# Patient Record
Sex: Male | Born: 1980
Health system: Southern US, Community
[De-identification: ages and names within clinical notes are randomized; demographics above are authoritative.]

## PROBLEM LIST (undated history)

## (undated) DIAGNOSIS — I639 Cerebral infarction, unspecified: Secondary | ICD-10-CM

## (undated) DIAGNOSIS — R569 Unspecified convulsions: Secondary | ICD-10-CM

## (undated) DIAGNOSIS — F1910 Other psychoactive substance abuse, uncomplicated: Secondary | ICD-10-CM

## (undated) DIAGNOSIS — Z87898 Personal history of other specified conditions: Secondary | ICD-10-CM

## (undated) DIAGNOSIS — I10 Essential (primary) hypertension: Secondary | ICD-10-CM

## (undated) DIAGNOSIS — E119 Type 2 diabetes mellitus without complications: Secondary | ICD-10-CM

## (undated) DIAGNOSIS — E876 Hypokalemia: Secondary | ICD-10-CM

## (undated) DIAGNOSIS — R739 Hyperglycemia, unspecified: Secondary | ICD-10-CM

## (undated) DIAGNOSIS — N189 Chronic kidney disease, unspecified: Secondary | ICD-10-CM

## (undated) HISTORY — DX: Other psychoactive substance abuse, uncomplicated: F19.10

## (undated) HISTORY — DX: Personal history of other specified conditions: Z87.898

## (undated) HISTORY — DX: Unspecified convulsions: R56.9

## (undated) HISTORY — DX: Chronic kidney disease, unspecified: N18.9

## (undated) HISTORY — DX: Type 2 diabetes mellitus without complications: E11.9

## (undated) HISTORY — DX: Hyperglycemia, unspecified: R73.9

## (undated) HISTORY — DX: Hypokalemia: E87.6

## (undated) HISTORY — DX: Cerebral infarction, unspecified: I63.9

---

## 2016-04-03 ENCOUNTER — Encounter (HOSPITAL_COMMUNITY): Payer: Self-pay | Admitting: Emergency Medicine

## 2016-04-03 ENCOUNTER — Emergency Department (HOSPITAL_COMMUNITY)
Admission: EM | Admit: 2016-04-03 | Discharge: 2016-04-04 | Disposition: A | Discharge status: Home / Self Care | Payer: Self-pay | Attending: Emergency Medicine | Admitting: Emergency Medicine

## 2016-04-03 DIAGNOSIS — R197 Diarrhea, unspecified: Secondary | ICD-10-CM | POA: Insufficient documentation

## 2016-04-03 DIAGNOSIS — I10 Essential (primary) hypertension: Secondary | ICD-10-CM | POA: Insufficient documentation

## 2016-04-03 DIAGNOSIS — R6889 Other general symptoms and signs: Secondary | ICD-10-CM

## 2016-04-03 DIAGNOSIS — Z79899 Other long term (current) drug therapy: Secondary | ICD-10-CM | POA: Insufficient documentation

## 2016-04-03 DIAGNOSIS — R1110 Vomiting, unspecified: Secondary | ICD-10-CM | POA: Insufficient documentation

## 2016-04-03 HISTORY — DX: Essential (primary) hypertension: I10

## 2016-04-03 LAB — URINALYSIS, ROUTINE W REFLEX MICROSCOPIC
BACTERIA UA: NONE SEEN
BILIRUBIN URINE: NEGATIVE
HGB URINE DIPSTICK: NEGATIVE
Ketones, ur: 5 mg/dL — AB
NITRITE: NEGATIVE
PROTEIN: 100 mg/dL — AB
Specific Gravity, Urine: 1.024 (ref 1.005–1.030)
pH: 7 (ref 5.0–8.0)

## 2016-04-03 LAB — CBC
HEMATOCRIT: 41.5 % (ref 39.0–52.0)
HEMOGLOBIN: 14.6 g/dL (ref 13.0–17.0)
MCH: 25.6 pg — ABNORMAL LOW (ref 26.0–34.0)
MCHC: 35.2 g/dL (ref 30.0–36.0)
MCV: 72.8 fL — ABNORMAL LOW (ref 78.0–100.0)
Platelets: 182 10*3/uL (ref 150–400)
RBC: 5.7 MIL/uL (ref 4.22–5.81)
RDW: 14 % (ref 11.5–15.5)
WBC: 8.5 10*3/uL (ref 4.0–10.5)

## 2016-04-03 LAB — COMPREHENSIVE METABOLIC PANEL
ALBUMIN: 4.3 g/dL (ref 3.5–5.0)
ALT: 28 U/L (ref 17–63)
ANION GAP: 9 (ref 5–15)
AST: 26 U/L (ref 15–41)
Alkaline Phosphatase: 93 U/L (ref 38–126)
BILIRUBIN TOTAL: 0.9 mg/dL (ref 0.3–1.2)
BUN: 12 mg/dL (ref 6–20)
CALCIUM: 8.7 mg/dL — AB (ref 8.9–10.3)
CO2: 28 mmol/L (ref 22–32)
Chloride: 99 mmol/L — ABNORMAL LOW (ref 101–111)
Creatinine, Ser: 1.23 mg/dL (ref 0.61–1.24)
GLUCOSE: 355 mg/dL — AB (ref 65–99)
POTASSIUM: 3.4 mmol/L — AB (ref 3.5–5.1)
Sodium: 136 mmol/L (ref 135–145)
TOTAL PROTEIN: 7.6 g/dL (ref 6.5–8.1)

## 2016-04-03 LAB — LIPASE, BLOOD: Lipase: 24 U/L (ref 11–51)

## 2016-04-03 MED ORDER — HYDROCHLOROTHIAZIDE 25 MG PO TABS
25.0000 mg | ORAL_TABLET | Freq: Every day | ORAL | Status: DC
  Administered 2016-04-03: 25 mg via ORAL
  Filled 2016-04-03: qty 1

## 2016-04-03 NOTE — ED Notes (Signed)
Informed patient that a urine sample was needed, patient stated he just went, will recheck in about 30 minutes.

## 2016-04-03 NOTE — ED Triage Notes (Signed)
Pt states that he has been staying with his brother and his family that got dx with flu.  For past 3-4 days, pt has been weak, has headache, NVD.  Pt is very hypertensive at 243/148 (done twice to confirm).  Pt w/ hx of htn but has been out of meds for one week due to insurance.

## 2016-04-04 MED ORDER — HYDROCHLOROTHIAZIDE 25 MG PO TABS: 25.0000 mg | ORAL_TABLET | Freq: Every day | ORAL | 2 refills | Status: DC

## 2016-04-04 NOTE — ED Provider Notes (Signed)
[facility]-EMERGENCY DEPT Provider Note   CSN: [id number] Arrival date & time: 04/03/16  2147  By signing my name below, I, [name], attest that this documentation has been prepared under the direction and in the presence of Derwood KaplanAnkit Charonda Hefter, MD. Electronically Signed: [name], ER Scribe. 11/17/2015. 12:23 AM.   History   Chief Complaint Chief Complaint  Patient presents with  . Headache  . Generalized Body Aches  . Nausea  . Emesis  . Diarrhea   The history is provided by the patient. No language interpreter was used.    HPI Comments: Micheal Levy is a 35 y.o. male who presents to the Emergency Department complaining of complaing of four days of HA, diarrhea and general malaise. He is also complaining of one event of vomiting today after eating. He has a PMHx of HTN and has been out of his blood pressure medication for one week because he lost his job and insurance. He has been prescribed HCTZ 25mg . He denies dizziness, weakenss, numbness, visual changes, cough or abdonimal pain. Pt's nephew was recently diagnosed with flu.   Past Medical History:  Diagnosis Date  . Hypertension     There are no active problems to display for this patient.   No past surgical history on file.   Home Medications    Prior to Admission medications   Medication Sig Start Date End Date Taking? Authorizing Provider  hydrochlorothiazide (HYDRODIURIL) 25 MG tablet Take 1 tablet (25 mg total) by mouth daily. 04/04/16   Derwood KaplanAnkit Lavaun Greenfield, MD    Family History No family history on file.  Social History Social History  Substance Use Topics  . Smoking status: Never Smoker  . Smokeless tobacco: Not on file  . Alcohol use No     Allergies   Patient has no known allergies.   Review of Systems Review of Systems A complete 10 system review of systems was obtained and all systems are negative except as noted in the HPI and PMH.   Physical Exam Updated Vital Signs BP (!) 180/115    Pulse 87   Temp 98.6 F (37 C) (Oral)   Resp (!) 27   SpO2 98%   Physical Exam  Constitutional: He is oriented to person, place, and time. He appears well-developed and well-nourished. No distress.  HENT:  Head: Normocephalic and atraumatic.  Mucous membranes moist, sclera clear.   Eyes: Pupils are equal, round, and reactive to light.  Neck: Neck supple.  Cardiovascular: Normal rate.   Pulmonary/Chest: Effort normal. No respiratory distress.  Musculoskeletal: Normal range of motion.  Neurological: He is alert and oriented to person, place, and time. Coordination normal.  Upper and lower sensory exam normal. Cranial nerve 2-12 intact.   Skin: Skin is warm and dry. He is not diaphoretic.  Psychiatric: He has a normal mood and affect. His behavior is normal.  Nursing note and vitals reviewed.    ED Treatments / Results  DIAGNOSTIC STUDIES: Oxygen Saturation is 98% on RA, normal by my interpretation.    COORDINATION OF CARE: 12:25 AM Discussed treatment plan with pt at bedside and pt agreed to plan.  Labs (all labs ordered are listed, but only abnormal results are displayed) Labs Reviewed  COMPREHENSIVE METABOLIC PANEL - Abnormal; Notable for the following:       Result Value   Potassium 3.4 (*)    Chloride 99 (*)    Glucose, Bld 355 (*)    Calcium 8.7 (*)    All  other components within normal limits  CBC - Abnormal; Notable for the following:    MCV 72.8 (*)    MCH 25.6 (*)    All other components within normal limits  URINALYSIS, ROUTINE W REFLEX MICROSCOPIC - Abnormal; Notable for the following:    Glucose, UA >=500 (*)    Ketones, ur 5 (*)    Protein, ur 100 (*)    Leukocytes, UA TRACE (*)    Squamous Epithelial / LPF 0-5 (*)    All other components within normal limits  LIPASE, BLOOD    EKG  EKG Interpretation None       Radiology No results found.  Procedures Procedures (including critical care time)  Medications Ordered in ED Medications    hydrochlorothiazide (HYDRODIURIL) tablet 25 mg (25 mg Oral Given 04/03/16 2357)     Initial Impression / Assessment and Plan / ED Course  I have reviewed the triage vital signs and the nursing notes.  Pertinent labs & imaging results that were available during my care of the patient were reviewed by me and considered in my medical decision making (see chart for details).  Clinical Course    I personally performed the services described in this documentation, which was scribed in my presence. The recorded information has been reviewed and is accurate.  Pt comes in with cc of elevated BP and flu like symptoms.  Flu like symptoms - pt has some GI symptoms - loose BM, emesis x 1. Pt doesn't have much of URI like symptoms. Likely viral syndrome - but pt is immunocompetent and has normal vitals. Strict ER return precautions have been discussed, and patient is agreeing with the plan and is comfortable.  Pt also has elevated BP. Headache w/o neuro complains and the headaches are intermittent. Non focal neuro exam. We will start pt back on HCTZ. Labs reassuring, no chest pain.   Final Clinical Impressions(s) / ED Diagnoses   Final diagnoses:  Flu-like symptoms  Essential hypertension    New Prescriptions Current Discharge Medication List             Derwood KaplanAnkit Arkie Tagliaferro, MD 04/04/16 [phone]

## 2016-12-14 ENCOUNTER — Encounter (HOSPITAL_COMMUNITY): Payer: Self-pay | Admitting: Emergency Medicine

## 2016-12-14 DIAGNOSIS — Z79899 Other long term (current) drug therapy: Secondary | ICD-10-CM | POA: Insufficient documentation

## 2016-12-14 DIAGNOSIS — R05 Cough: Secondary | ICD-10-CM | POA: Insufficient documentation

## 2016-12-14 DIAGNOSIS — I10 Essential (primary) hypertension: Secondary | ICD-10-CM | POA: Insufficient documentation

## 2016-12-14 NOTE — ED Triage Notes (Signed)
Reports URI symptoms for three days.  Taking nyquil and dayquil.  Also c/o pain in left ribcage on when touching or coughing.  BP elevated in triage.  Took medication this morning.

## 2016-12-14 NOTE — ED Notes (Signed)
Pt to NF requesting update, informed pt how many ahead. Apologized for delay and attempted to explain acuity to pt

## 2016-12-15 ENCOUNTER — Emergency Department (HOSPITAL_COMMUNITY): Payer: Self-pay

## 2016-12-15 ENCOUNTER — Emergency Department (HOSPITAL_COMMUNITY)
Admission: EM | Admit: 2016-12-15 | Discharge: 2016-12-15 | Disposition: A | Discharge status: Home / Self Care | Payer: Self-pay | Attending: Emergency Medicine | Admitting: Emergency Medicine

## 2016-12-15 DIAGNOSIS — E876 Hypokalemia: Secondary | ICD-10-CM

## 2016-12-15 DIAGNOSIS — R05 Cough: Secondary | ICD-10-CM

## 2016-12-15 DIAGNOSIS — R059 Cough, unspecified: Secondary | ICD-10-CM

## 2016-12-15 LAB — BASIC METABOLIC PANEL
Anion gap: 11 (ref 5–15)
BUN: 13 mg/dL (ref 6–20)
CHLORIDE: 102 mmol/L (ref 101–111)
CO2: 26 mmol/L (ref 22–32)
Calcium: 8.8 mg/dL — ABNORMAL LOW (ref 8.9–10.3)
Creatinine, Ser: 1.32 mg/dL — ABNORMAL HIGH (ref 0.61–1.24)
GFR calc Af Amer: >60 mL/min (ref >60)
Glucose, Bld: 133 mg/dL — ABNORMAL HIGH (ref 65–99)
POTASSIUM: 2.8 mmol/L — AB (ref 3.5–5.1)
SODIUM: 139 mmol/L (ref 135–145)

## 2016-12-15 LAB — CBC WITH DIFFERENTIAL/PLATELET
BASOS ABS: 0 10*3/uL (ref 0.0–0.1)
Basophils Relative: 0 %
EOS ABS: 0.2 10*3/uL (ref 0.0–0.7)
Eosinophils Relative: 2 %
HCT: 37.9 % — ABNORMAL LOW (ref 39.0–52.0)
HEMOGLOBIN: 12.4 g/dL — AB (ref 13.0–17.0)
LYMPHS ABS: 2.5 10*3/uL (ref 0.7–4.0)
LYMPHS PCT: 28 %
MCH: 25.4 pg — AB (ref 26.0–34.0)
MCHC: 32.7 g/dL (ref 30.0–36.0)
MCV: 77.5 fL — ABNORMAL LOW (ref 78.0–100.0)
Monocytes Absolute: 0.7 10*3/uL (ref 0.1–1.0)
Monocytes Relative: 7 %
NEUTROS PCT: 63 %
Neutro Abs: 5.6 10*3/uL (ref 1.7–7.7)
Platelets: 226 10*3/uL (ref 150–400)
RBC: 4.89 MIL/uL (ref 4.22–5.81)
RDW: 14.7 % (ref 11.5–15.5)
WBC: 9.1 10*3/uL (ref 4.0–10.5)

## 2016-12-15 LAB — I-STAT TROPONIN, ED: TROPONIN I, POC: 0.02 ng/mL (ref 0.00–0.08)

## 2016-12-15 LAB — BRAIN NATRIURETIC PEPTIDE: B NATRIURETIC PEPTIDE 5: 284.8 pg/mL — AB (ref 0.0–100.0)

## 2016-12-15 MED ORDER — ALBUTEROL SULFATE HFA 108 (90 BASE) MCG/ACT IN AERS
2.0000 | INHALATION_SPRAY | RESPIRATORY_TRACT | Status: DC | PRN
  Administered 2016-12-15: 2 via RESPIRATORY_TRACT
  Filled 2016-12-15: qty 6.7

## 2016-12-15 MED ORDER — POTASSIUM CHLORIDE CRYS ER 20 MEQ PO TBCR: 20.0000 meq | EXTENDED_RELEASE_TABLET | Freq: Once | ORAL | 0 refills | Status: DC

## 2016-12-15 MED ORDER — LABETALOL HCL 5 MG/ML IV SOLN
20.0000 mg | Freq: Once | INTRAVENOUS | Status: AC
  Administered 2016-12-15: 20 mg via INTRAVENOUS
  Filled 2016-12-15: qty 4

## 2016-12-15 MED ORDER — ALBUTEROL SULFATE (2.5 MG/3ML) 0.083% IN NEBU: 2.5000 mg | INHALATION_SOLUTION | Freq: Four times a day (QID) | RESPIRATORY_TRACT | 12 refills | Status: DC | PRN

## 2016-12-15 MED ORDER — POTASSIUM CHLORIDE CRYS ER 20 MEQ PO TBCR
40.0000 meq | EXTENDED_RELEASE_TABLET | Freq: Once | ORAL | Status: AC
  Administered 2016-12-15: 40 meq via ORAL
  Filled 2016-12-15: qty 2

## 2016-12-15 MED ORDER — IPRATROPIUM-ALBUTEROL 0.5-2.5 (3) MG/3ML IN SOLN
3.0000 mL | Freq: Once | RESPIRATORY_TRACT | Status: AC
  Administered 2016-12-15: 3 mL via RESPIRATORY_TRACT
  Filled 2016-12-15: qty 3

## 2016-12-15 MED ORDER — AMOXICILLIN-POT CLAVULANATE 875-125 MG PO TABS: 1.0000 | ORAL_TABLET | Freq: Two times a day (BID) | ORAL | 0 refills | Status: DC

## 2016-12-15 MED ORDER — FUROSEMIDE 20 MG PO TABS: 20.0000 mg | ORAL_TABLET | Freq: Every day | ORAL | 0 refills | Status: DC

## 2016-12-15 NOTE — ED Notes (Signed)
Pt ambulatory to room with steady gait

## 2016-12-15 NOTE — ED Notes (Signed)
Pt transported to XR.  

## 2016-12-15 NOTE — ED Notes (Signed)
Pt reports he has not followed up with PCP about BP since Nov 2017. Pt states "Yeah it's always that high. I take my medicine every morning but it's always 200 somethin"

## 2016-12-15 NOTE — ED Notes (Signed)
ED Provider at bedside. 

## 2016-12-15 NOTE — ED Provider Notes (Signed)
[facility]-EMERGENCY DEPT Provider Note   CSN: [id number] Arrival date & time: 12/14/16  2024     History   Chief Complaint Chief Complaint  Patient presents with  . Cough  . chest congestion    HPI Micheal Levy is a 36 y.o. male.  Patient presents to the ED with a chief complaint of cough.  He states that he has had a productive lingering cough for the past 2 months.  However, he states that his symptoms worsened over the past 3-4 days.  He now complains of left lower chest pain, which is worse when coughing and taking a deep breath.  He denies any fevers or chills.  Denies any nausea or vomiting.  There are no other associated symptoms or modifying factors.   The history is provided by the patient. No language interpreter was used.    Past Medical History:  Diagnosis Date  . Hypertension     There are no active problems to display for this patient.   History reviewed. No pertinent surgical history.     Home Medications    Prior to Admission medications   Medication Sig Start Date End Date Taking? Authorizing Provider  hydrochlorothiazide (HYDRODIURIL) 25 MG tablet Take 1 tablet (25 mg total) by mouth daily. 04/04/16   Derwood Kaplan, MD    Family History No family history on file.  Social History Social History  Substance Use Topics  . Smoking status: Never Smoker  . Smokeless tobacco: Former Neurosurgeon  . Alcohol use No     Allergies   Patient has no known allergies.   Review of Systems Review of Systems  All other systems reviewed and are negative.    Physical Exam Updated Vital Signs BP (!) 216/151 (BP Location: Right Arm)   Pulse (!) 102   Temp 98.8 F (37.1 C) (Oral)   Resp 18   Ht  (1.753 m)   Wt 103 kg (227 lb)   SpO2 99%   BMI 33.52 kg/m   Physical Exam  Constitutional: He is oriented to person, place, and time. He appears well-developed and well-nourished.  HENT:  Head: Normocephalic and atraumatic.  Eyes: Pupils are equal,  round, and reactive to light. Conjunctivae and EOM are normal. Right eye exhibits no discharge. Left eye exhibits no discharge. No scleral icterus.  Neck: Normal range of motion. Neck supple. No JVD present.  Cardiovascular: Normal rate, regular rhythm and normal heart sounds.  Exam reveals no gallop and no friction rub.   No murmur heard. Pulmonary/Chest: Effort normal. No respiratory distress. He has wheezes. He has no rales. He exhibits no tenderness.  Bibasilar crackles and wheezes  Abdominal: Soft. He exhibits no distension and no mass. There is no tenderness. There is no rebound and no guarding.  Musculoskeletal: Normal range of motion. He exhibits no edema or tenderness.  Neurological: He is alert and oriented to person, place, and time.  Skin: Skin is warm and dry.  Psychiatric: He has a normal mood and affect. His behavior is normal. Judgment and thought content normal.  Nursing note and vitals reviewed.    ED Treatments / Results  Labs (all labs ordered are listed, but only abnormal results are displayed) Labs Reviewed - No data to display  EKG  EKG Interpretation None       Radiology No results found.  Procedures Procedures (including critical care time)  Medications Ordered in ED Medications  ipratropium-albuterol (DUONEB) 0.5-2.5 (3) MG/3ML nebulizer solution 3 mL (not administered)  Initial Impression / Assessment and Plan / ED Course  I have reviewed the triage vital signs and the nursing notes.  Pertinent labs & imaging results that were available during my care of the patient were reviewed by me and considered in my medical decision making (see chart for details).     Patient with linger productive cough, worsening over the past several days.  Wheezing and crackles on exam.  Will give breathing treatment and get CXR.  Suspect pneumonia.  Chest x-ray shows cardiomegaly, and pulmonary edema without focal consolidation. Given that the patient's blood  pressure is quite elevated, will investigate this further. Will give the patient some labetalol to bring down his pressure and will check labs. I updated the patient, he agrees with this plan.  Patient states that he does feel improved after receiving albuterol treatment.  Patient and labs discussed with Dr. Blinda LeatherwoodPollina, who recommends repleting K and discharging with a small amount of lasix.  Recommends close follow-up with cardiology.  I will also treat for pneumonia given his symptoms today.  Final Clinical Impressions(s) / ED Diagnoses   Final diagnoses:  Cough  Hypokalemia    New Prescriptions New Prescriptions   ALBUTEROL (PROVENTIL) (2.5 MG/3ML) 0.083% NEBULIZER SOLUTION    Take 3 mLs (2.5 mg total) by nebulization every 6 (six) hours as needed for wheezing or shortness of breath.   AMOXICILLIN-CLAVULANATE (AUGMENTIN) 875-125 MG TABLET    Take 1 tablet by mouth every 12 (twelve) hours.   FUROSEMIDE (LASIX) 20 MG TABLET    Take 1 tablet (20 mg total) by mouth daily.   POTASSIUM CHLORIDE SA (K-DUR,KLOR-CON) 20 MEQ TABLET    Take 1 tablet (20 mEq total) by mouth once.     Roxy HorsemanBrowning, Calvary Difranco, PA-C 12/15/16 0455    Gilda CreasePollina, Christopher J, MD 12/15/16 [phone]

## 2016-12-15 NOTE — Discharge Instructions (Signed)
You need to take the antibiotics for the cough.  Your x-ray showed some concerning findings for possible pulmonary edema.  Please follow-up with the cardiologist ASAP for this.  You have been given a fluid pill, please take as directed.

## 2016-12-15 NOTE — ED Notes (Signed)
BNP will result in approx 15 min per lab

## 2017-03-07 DIAGNOSIS — I639 Cerebral infarction, unspecified: Secondary | ICD-10-CM

## 2017-03-07 DIAGNOSIS — R569 Unspecified convulsions: Secondary | ICD-10-CM

## 2017-03-07 HISTORY — DX: Unspecified convulsions: R56.9

## 2017-03-07 HISTORY — DX: Cerebral infarction, unspecified: I63.9

## 2017-03-21 ENCOUNTER — Emergency Department (HOSPITAL_COMMUNITY): Payer: Self-pay

## 2017-03-21 ENCOUNTER — Inpatient Hospital Stay (HOSPITAL_COMMUNITY)
Admission: EM | Admit: 2017-03-21 | Discharge: 2017-03-26 | DRG: 100 | Disposition: A | Discharge status: Home / Self Care | Payer: Self-pay | Attending: Internal Medicine | Admitting: Internal Medicine

## 2017-03-21 DIAGNOSIS — W19XXXA Unspecified fall, initial encounter: Secondary | ICD-10-CM | POA: Diagnosis present

## 2017-03-21 DIAGNOSIS — R569 Unspecified convulsions: Principal | ICD-10-CM | POA: Diagnosis present

## 2017-03-21 DIAGNOSIS — N179 Acute kidney failure, unspecified: Secondary | ICD-10-CM | POA: Diagnosis present

## 2017-03-21 DIAGNOSIS — I6340 Cerebral infarction due to embolism of unspecified cerebral artery: Secondary | ICD-10-CM

## 2017-03-21 DIAGNOSIS — E872 Acidosis: Secondary | ICD-10-CM | POA: Diagnosis present

## 2017-03-21 DIAGNOSIS — Z79899 Other long term (current) drug therapy: Secondary | ICD-10-CM

## 2017-03-21 DIAGNOSIS — F17210 Nicotine dependence, cigarettes, uncomplicated: Secondary | ICD-10-CM | POA: Diagnosis present

## 2017-03-21 DIAGNOSIS — R739 Hyperglycemia, unspecified: Secondary | ICD-10-CM | POA: Diagnosis present

## 2017-03-21 DIAGNOSIS — I639 Cerebral infarction, unspecified: Secondary | ICD-10-CM | POA: Diagnosis present

## 2017-03-21 DIAGNOSIS — Z9911 Dependence on respirator [ventilator] status: Secondary | ICD-10-CM

## 2017-03-21 DIAGNOSIS — E669 Obesity, unspecified: Secondary | ICD-10-CM | POA: Diagnosis present

## 2017-03-21 DIAGNOSIS — I161 Hypertensive emergency: Secondary | ICD-10-CM | POA: Diagnosis present

## 2017-03-21 DIAGNOSIS — E1100 Type 2 diabetes mellitus with hyperosmolarity without nonketotic hyperglycemic-hyperosmolar coma (NKHHC): Secondary | ICD-10-CM | POA: Diagnosis present

## 2017-03-21 DIAGNOSIS — I129 Hypertensive chronic kidney disease with stage 1 through stage 4 chronic kidney disease, or unspecified chronic kidney disease: Secondary | ICD-10-CM | POA: Diagnosis present

## 2017-03-21 DIAGNOSIS — N170 Acute kidney failure with tubular necrosis: Secondary | ICD-10-CM

## 2017-03-21 DIAGNOSIS — Z23 Encounter for immunization: Secondary | ICD-10-CM

## 2017-03-21 DIAGNOSIS — E876 Hypokalemia: Secondary | ICD-10-CM | POA: Diagnosis present

## 2017-03-21 DIAGNOSIS — E1122 Type 2 diabetes mellitus with diabetic chronic kidney disease: Secondary | ICD-10-CM | POA: Diagnosis present

## 2017-03-21 DIAGNOSIS — F1410 Cocaine abuse, uncomplicated: Secondary | ICD-10-CM | POA: Diagnosis present

## 2017-03-21 DIAGNOSIS — J9601 Acute respiratory failure with hypoxia: Secondary | ICD-10-CM | POA: Diagnosis present

## 2017-03-21 DIAGNOSIS — J9690 Respiratory failure, unspecified, unspecified whether with hypoxia or hypercapnia: Secondary | ICD-10-CM

## 2017-03-21 DIAGNOSIS — J189 Pneumonia, unspecified organism: Secondary | ICD-10-CM | POA: Diagnosis present

## 2017-03-21 DIAGNOSIS — Z6835 Body mass index (BMI) 35.0-35.9, adult: Secondary | ICD-10-CM

## 2017-03-21 DIAGNOSIS — N189 Chronic kidney disease, unspecified: Secondary | ICD-10-CM | POA: Diagnosis present

## 2017-03-21 LAB — I-STAT ARTERIAL BLOOD GAS, ED
ACID-BASE DEFICIT: 8 mmol/L — AB (ref 0.0–2.0)
Acid-base deficit: 1 mmol/L (ref 0.0–2.0)
BICARBONATE: 19.4 mmol/L — AB (ref 20.0–28.0)
BICARBONATE: 26.2 mmol/L (ref 20.0–28.0)
O2 SAT: 92 %
O2 Saturation: 94 %
PCO2 ART: 44.8 mmHg (ref 32.0–48.0)
PCO2 ART: 50.5 mmHg — AB (ref 32.0–48.0)
PO2 ART: 75 mmHg — AB (ref 83.0–108.0)
Patient temperature: 98.4
Patient temperature: 98.4
TCO2: 21 mmol/L — AB (ref 22–32)
TCO2: 28 mmol/L (ref 22–32)
pH, Arterial: 7.245 — ABNORMAL LOW (ref 7.350–7.450)
pH, Arterial: 7.322 — ABNORMAL LOW (ref 7.350–7.450)
pO2, Arterial: 76 mmHg — ABNORMAL LOW (ref 83.0–108.0)

## 2017-03-21 LAB — CBC WITH DIFFERENTIAL/PLATELET
BASOS ABS: 0 10*3/uL (ref 0.0–0.1)
Basophils Absolute: 0 10*3/uL (ref 0.0–0.1)
Basophils Relative: 0 %
Basophils Relative: 0 %
EOS PCT: 2 %
EOS PCT: 0 %
Eosinophils Absolute: 0.2 10*3/uL (ref 0.0–0.7)
Eosinophils Absolute: 0 10*3/uL (ref 0.0–0.7)
HCT: 46.6 % (ref 39.0–52.0)
HEMATOCRIT: 45.7 % (ref 39.0–52.0)
Hemoglobin: 15.5 g/dL (ref 13.0–17.0)
Hemoglobin: 15.9 g/dL (ref 13.0–17.0)
LYMPHS ABS: 3.6 10*3/uL (ref 0.7–4.0)
LYMPHS ABS: 1 10*3/uL (ref 0.7–4.0)
LYMPHS PCT: 39 %
LYMPHS PCT: 4 %
MCH: 25.8 pg — AB (ref 26.0–34.0)
MCH: 25.8 pg — AB (ref 26.0–34.0)
MCHC: 33.9 g/dL (ref 30.0–36.0)
MCHC: 34.1 g/dL (ref 30.0–36.0)
MCV: 76 fL — AB (ref 78.0–100.0)
MCV: 75.6 fL — AB (ref 78.0–100.0)
MONO ABS: 0.4 10*3/uL (ref 0.1–1.0)
MONO ABS: 1 10*3/uL (ref 0.1–1.0)
MONOS PCT: 4 %
Monocytes Relative: 4 %
NEUTROS ABS: 5.1 10*3/uL (ref 1.7–7.7)
Neutro Abs: 23 10*3/uL — ABNORMAL HIGH (ref 1.7–7.7)
Neutrophils Relative %: 55 %
Neutrophils Relative %: 92 %
PLATELETS: 232 10*3/uL (ref 150–400)
PLATELETS: 233 10*3/uL (ref 150–400)
RBC: 6.01 MIL/uL — AB (ref 4.22–5.81)
RBC: 6.16 MIL/uL — AB (ref 4.22–5.81)
RDW: 14.9 % (ref 11.5–15.5)
RDW: 15.2 % (ref 11.5–15.5)
WBC: 9.3 10*3/uL (ref 4.0–10.5)
WBC: 25 10*3/uL — AB (ref 4.0–10.5)

## 2017-03-21 LAB — URINALYSIS, ROUTINE W REFLEX MICROSCOPIC
BILIRUBIN URINE: NEGATIVE
Bacteria, UA: NONE SEEN
Ketones, ur: NEGATIVE mg/dL
Leukocytes, UA: NEGATIVE
NITRITE: NEGATIVE
PH: 7 (ref 5.0–8.0)
Protein, ur: >=300 mg/dL — AB
SPECIFIC GRAVITY, URINE: 1.026 (ref 1.005–1.030)

## 2017-03-21 LAB — BASIC METABOLIC PANEL
ANION GAP: 12 (ref 5–15)
BUN: 11 mg/dL (ref 6–20)
CALCIUM: 8.6 mg/dL — AB (ref 8.9–10.3)
CO2: 23 mmol/L (ref 22–32)
Chloride: 95 mmol/L — ABNORMAL LOW (ref 101–111)
Creatinine, Ser: 1.41 mg/dL — ABNORMAL HIGH (ref 0.61–1.24)
GFR calc non Af Amer: >60 mL/min (ref >60)
Glucose, Bld: 594 mg/dL (ref 65–99)
Potassium: 4.4 mmol/L (ref 3.5–5.1)
Sodium: 130 mmol/L — ABNORMAL LOW (ref 135–145)

## 2017-03-21 LAB — RAPID URINE DRUG SCREEN, HOSP PERFORMED
AMPHETAMINES: NOT DETECTED
BARBITURATES: NOT DETECTED
BENZODIAZEPINES: POSITIVE — AB
Cocaine: POSITIVE — AB
Opiates: NOT DETECTED
Tetrahydrocannabinol: NOT DETECTED

## 2017-03-21 LAB — COMPREHENSIVE METABOLIC PANEL
ALBUMIN: 4.2 g/dL (ref 3.5–5.0)
ALT: 28 U/L (ref 17–63)
ANION GAP: 19 — AB (ref 5–15)
AST: 35 U/L (ref 15–41)
Alkaline Phosphatase: 137 U/L — ABNORMAL HIGH (ref 38–126)
BILIRUBIN TOTAL: 0.7 mg/dL (ref 0.3–1.2)
BUN: 10 mg/dL (ref 6–20)
CHLORIDE: 92 mmol/L — AB (ref 101–111)
CO2: 19 mmol/L — ABNORMAL LOW (ref 22–32)
Calcium: 8.9 mg/dL (ref 8.9–10.3)
Creatinine, Ser: 1.7 mg/dL — ABNORMAL HIGH (ref 0.61–1.24)
GFR calc Af Amer: 58 mL/min — ABNORMAL LOW (ref >60)
GFR calc non Af Amer: 50 mL/min — ABNORMAL LOW (ref >60)
GLUCOSE: 773 mg/dL — AB (ref 65–99)
POTASSIUM: 3.3 mmol/L — AB (ref 3.5–5.1)
SODIUM: 130 mmol/L — AB (ref 135–145)
TOTAL PROTEIN: 7.7 g/dL (ref 6.5–8.1)

## 2017-03-21 LAB — CBG MONITORING, ED

## 2017-03-21 MED ORDER — DEXTROSE-NACL 5-0.45 % IV SOLN: INTRAVENOUS | Status: DC

## 2017-03-21 MED ORDER — PIPERACILLIN-TAZOBACTAM 3.375 G IVPB 30 MIN
3.3750 g | Freq: Once | INTRAVENOUS | Status: AC
  Administered 2017-03-21: 3.375 g via INTRAVENOUS
  Filled 2017-03-21: qty 50

## 2017-03-21 MED ORDER — LORAZEPAM 2 MG/ML IJ SOLN
INTRAMUSCULAR | Status: AC
  Filled 2017-03-21: qty 1

## 2017-03-21 MED ORDER — SODIUM CHLORIDE 0.9 % IV SOLN
INTRAVENOUS | Status: DC
  Administered 2017-03-21: 23:00:00 via INTRAVENOUS

## 2017-03-21 MED ORDER — PROPOFOL 1000 MG/100ML IV EMUL
INTRAVENOUS | Status: AC
  Administered 2017-03-21: 50 mg/kg/h via INTRAVENOUS
  Filled 2017-03-21: qty 100

## 2017-03-21 MED ORDER — SODIUM CHLORIDE 0.9 % IV SOLN
INTRAVENOUS | Status: DC
  Administered 2017-03-21: 5.4 [IU]/h via INTRAVENOUS
  Filled 2017-03-21: qty 1

## 2017-03-21 MED ORDER — VANCOMYCIN HCL 10 G IV SOLR
2000.0000 mg | Freq: Once | INTRAVENOUS | Status: AC
  Administered 2017-03-21: 2000 mg via INTRAVENOUS
  Filled 2017-03-21: qty 2000

## 2017-03-21 MED ORDER — LORAZEPAM 2 MG/ML IJ SOLN
1.0000 mg | Freq: Once | INTRAMUSCULAR | Status: AC
  Administered 2017-03-21: 0.5 mg via INTRAVENOUS

## 2017-03-21 MED ORDER — POTASSIUM CHLORIDE 20 MEQ/15ML (10%) PO SOLN: 40.0000 meq | Freq: Once | ORAL | Status: DC

## 2017-03-21 MED ORDER — SODIUM CHLORIDE 0.9 % IV SOLN
500.0000 mg | Freq: Two times a day (BID) | INTRAVENOUS | Status: DC
  Administered 2017-03-22 – 2017-03-24 (×5): 500 mg via INTRAVENOUS
  Filled 2017-03-21 (×5): qty 5

## 2017-03-21 MED ORDER — SUCCINYLCHOLINE CHLORIDE 20 MG/ML IJ SOLN
INTRAMUSCULAR | Status: AC | PRN
  Administered 2017-03-21: 170 mg via INTRAVENOUS

## 2017-03-21 MED ORDER — POTASSIUM CHLORIDE 10 MEQ/100ML IV SOLN
10.0000 meq | INTRAVENOUS | Status: AC
  Administered 2017-03-21 – 2017-03-22 (×3): 10 meq via INTRAVENOUS
  Filled 2017-03-21 (×4): qty 100

## 2017-03-21 MED ORDER — FENTANYL CITRATE (PF) 100 MCG/2ML IJ SOLN: 100.0000 ug | INTRAMUSCULAR | Status: DC | PRN

## 2017-03-21 MED ORDER — INSULIN REGULAR HUMAN 100 UNIT/ML IJ SOLN
INTRAMUSCULAR | Status: DC
  Filled 2017-03-21: qty 1

## 2017-03-21 MED ORDER — NICARDIPINE HCL IN NACL 20-0.86 MG/200ML-% IV SOLN
3.0000 mg/h | INTRAVENOUS | Status: DC
  Administered 2017-03-21: 5 mg/h via INTRAVENOUS
  Filled 2017-03-21: qty 200

## 2017-03-21 MED ORDER — FUROSEMIDE 10 MG/ML IJ SOLN
40.0000 mg | Freq: Once | INTRAMUSCULAR | Status: AC
  Administered 2017-03-21: 40 mg via INTRAVENOUS
  Filled 2017-03-21: qty 4

## 2017-03-21 MED ORDER — HEPARIN SODIUM (PORCINE) 5000 UNIT/ML IJ SOLN: 5000.0000 [IU] | Freq: Three times a day (TID) | INTRAMUSCULAR | Status: DC

## 2017-03-21 MED ORDER — ETOMIDATE 2 MG/ML IV SOLN
INTRAVENOUS | Status: AC | PRN
  Administered 2017-03-21: 35 mg via INTRAVENOUS

## 2017-03-21 MED ORDER — FENTANYL CITRATE (PF) 100 MCG/2ML IJ SOLN
100.0000 ug | INTRAMUSCULAR | Status: DC | PRN
Start: 2017-03-21 — End: 2017-03-22

## 2017-03-21 MED ORDER — PROPOFOL 1000 MG/100ML IV EMUL
0.0000 ug/kg/min | INTRAVENOUS | Status: DC
Start: 2017-03-21 — End: 2017-03-24
  Administered 2017-03-21: 50 mg/kg/h via INTRAVENOUS
  Administered 2017-03-21 (×2): 50 ug/kg/min via INTRAVENOUS
  Administered 2017-03-22 (×2): 15 ug/kg/min via INTRAVENOUS
  Administered 2017-03-22: 30 ug/kg/min via INTRAVENOUS
  Administered 2017-03-22 – 2017-03-23 (×2): 15 ug/kg/min via INTRAVENOUS
  Administered 2017-03-23 (×2): 20 ug/kg/min via INTRAVENOUS
  Administered 2017-03-24: 15 ug/kg/min via INTRAVENOUS
  Filled 2017-03-21 (×8): qty 100

## 2017-03-21 MED ORDER — PROPOFOL 1000 MG/100ML IV EMUL
INTRAVENOUS | Status: AC
  Filled 2017-03-21: qty 100

## 2017-03-21 MED ORDER — PIPERACILLIN-TAZOBACTAM 3.375 G IVPB
3.3750 g | Freq: Three times a day (TID) | INTRAVENOUS | Status: DC
  Administered 2017-03-22 – 2017-03-25 (×11): 3.375 g via INTRAVENOUS
  Filled 2017-03-21 (×13): qty 50

## 2017-03-21 MED ORDER — LEVETIRACETAM 500 MG/5ML IV SOLN
1000.0000 mg | Freq: Once | INTRAVENOUS | Status: AC
  Administered 2017-03-21: 1000 mg via INTRAVENOUS
  Filled 2017-03-21: qty 10

## 2017-03-21 MED ORDER — VANCOMYCIN HCL IN DEXTROSE 750-5 MG/150ML-% IV SOLN
750.0000 mg | Freq: Two times a day (BID) | INTRAVENOUS | Status: DC
  Administered 2017-03-22: 750 mg via INTRAVENOUS
  Filled 2017-03-21: qty 150

## 2017-03-21 MED ORDER — PROPOFOL 1000 MG/100ML IV EMUL
INTRAVENOUS | Status: AC
  Administered 2017-03-21: 50 ug/kg/min via INTRAVENOUS
  Filled 2017-03-21: qty 100

## 2017-03-21 NOTE — Code Documentation (Addendum)
Intubation attempt X1, unsuccessful. Pt HR dropped to 40's. O2 dropped to 60's.  Intubation attempt stopped, pt being bagged

## 2017-03-21 NOTE — Code Documentation (Signed)
Intubation successful, positive color change

## 2017-03-21 NOTE — Code Documentation (Signed)
Another unsuccessful intubation. EDP attempting for 3rd time with GlideScope

## 2017-03-21 NOTE — ED Notes (Addendum)
CBG was greater than 600 at 20:10.

## 2017-03-21 NOTE — ED Provider Notes (Signed)
[facility]ERGENCY DEPARTMENT Provider Note   CSN: [id number] Arrival date & time: 03/21/17  2006     History   Chief Complaint Chief Complaint  Patient presents with  . Seizures  Level 5 caveat altered mental status history is obtained from patient and from patient's wife who accompanies him and from paramedics  HPI Micheal Levy is a 36 y.o. male.  Patient had 2 generalized seizures immediately prior to coming here.  He was doing well earlier today.  EMS noted blood sugar to be greater than 600 and blood pressure to be 308 systolic.  They treated him with hard cervical collar and inserted a nasal trumpet as he fell and struck his head.  His wife reports he has history of hypertension and has been compliant with his medications.  HPI  No past medical history on file.  Past medical history hypertension There are no active problems to display for this patient.        Home Medications    Prior to Admission medications   Not on File    Family History No family history on file.  Social History Social History   Tobacco Use  . Smoking status: Not on file  Substance Use Topics  . Alcohol use: Not on file  . Drug use: Not on file   No tobacco no alcohol no drug  Allergies   Patient has no known allergies.   Review of Systems Review of Systems  Unable to perform ROS: Mental status change  Neurological: Positive for seizures.     Physical Exam Updated Vital Signs BP (!) 239/134 (BP Location: Right Arm)   Pulse (!) 129   Temp 98.4 F (36.9 C) (Rectal)   Resp (!) 41   Ht 6' (1.829 m)   Wt 113.4 kg (250 lb)   SpO2 91%   BMI 33.91 kg/m   Physical Exam  Constitutional: He appears well-developed and well-nourished. He appears distressed.  Combative, follows some simple commands.  Oriented to hospital moves all extremities  HENT:  Head: Normocephalic and atraumatic.  Mucous membranes dry, no facial asymmetry  Eyes: Conjunctivae are  normal. Pupils are equal, round, and reactive to light.  Pupils 2-3 mm equal and reactive to light  Neck: Neck supple. No tracheal deviation present. No thyromegaly present.  Cardiovascular: Regular rhythm.  No murmur heard. Tachycardic  Pulmonary/Chest: Effort normal and breath sounds normal.  Abdominal: Soft. Bowel sounds are normal. He exhibits no distension. There is no tenderness.  Musculoskeletal: Normal range of motion. He exhibits no edema or tenderness.  Neurological: Coordination normal.  Intermittently somnolent and combative.  Motor strength 5/5 overall  Skin: Skin is warm and dry. No rash noted.  Nursing note and vitals reviewed.    ED Treatments / Results  Labs (all labs ordered are listed, but only abnormal results are displayed) Labs Reviewed  CBG MONITORING, ED - Abnormal; Notable for the following components:      Result Value   Glucose-Capillary >600 (*)    All other components within normal limits  I-STAT ARTERIAL BLOOD GAS, ED - Abnormal; Notable for the following components:   pH, Arterial 7.245 (*)    pO2, Arterial 75.0 (*)    Bicarbonate 19.4 (*)    TCO2 21 (*)    Acid-base deficit 8.0 (*)    All other components within normal limits  COMPREHENSIVE METABOLIC PANEL  CBC WITH DIFFERENTIAL/PLATELET  RAPID URINE DRUG SCREEN, HOSP PERFORMED  URINALYSIS, ROUTINE W REFLEX MICROSCOPIC  BLOOD GAS, ARTERIAL   Chest x-ray reviewed by me Results for orders placed or performed during the hospital encounter of 03/21/17  Comprehensive metabolic panel  Result Value Ref Range   Sodium 130 (L) 135 - 145 mmol/L   Potassium 3.3 (L) 3.5 - 5.1 mmol/L   Chloride 92 (L) 101 - 111 mmol/L   CO2 19 (L) 22 - 32 mmol/L   Glucose, Bld 773 (HH) 65 - 99 mg/dL   BUN 10 6 - 20 mg/dL   Creatinine, Ser 1.70 (H) 0.61 - 1.24 mg/dL   Calcium 8.9 8.9 - 10.3 mg/dL   Total Protein 7.7 6.5 - 8.1 g/dL   Albumin 4.2 3.5 - 5.0 g/dL   AST 35 15 - 41 U/L   ALT 28 17 - 63 U/L   Alkaline  Phosphatase 137 (H) 38 - 126 U/L   Total Bilirubin 0.7 0.3 - 1.2 mg/dL   GFR calc non Af Amer 50 (L) >60 mL/min   GFR calc Af Amer 58 (L) >60 mL/min   Anion gap 19 (H) 5 - 15  CBC with Differential/Platelet  Result Value Ref Range   WBC 9.3 4.0 - 10.5 K/uL   RBC 6.01 (H) 4.22 - 5.81 MIL/uL   Hemoglobin 15.5 13.0 - 17.0 g/dL   HCT 45.7 39.0 - 52.0 %   MCV 76.0 (L) 78.0 - 100.0 fL   MCH 25.8 (L) 26.0 - 34.0 pg   MCHC 33.9 30.0 - 36.0 g/dL   RDW 14.9 11.5 - 15.5 %   Platelets 232 150 - 400 K/uL   Neutrophils Relative % 55 %   Neutro Abs 5.1 1.7 - 7.7 K/uL   Lymphocytes Relative 39 %   Lymphs Abs 3.6 0.7 - 4.0 K/uL   Monocytes Relative 4 %   Monocytes Absolute 0.4 0.1 - 1.0 K/uL   Eosinophils Relative 2 %   Eosinophils Absolute 0.2 0.0 - 0.7 K/uL   Basophils Relative 0 %   Basophils Absolute 0.0 0.0 - 0.1 K/uL  Rapid urine drug screen (hospital performed)  Result Value Ref Range   Opiates NONE DETECTED NONE DETECTED   Cocaine POSITIVE (A) NONE DETECTED   Benzodiazepines POSITIVE (A) NONE DETECTED   Amphetamines NONE DETECTED NONE DETECTED   Tetrahydrocannabinol NONE DETECTED NONE DETECTED   Barbiturates NONE DETECTED NONE DETECTED  Urinalysis, Routine w reflex microscopic  Result Value Ref Range   Color, Urine STRAW (A) YELLOW   APPearance CLEAR CLEAR   Specific Gravity, Urine 1.026 1.005 - 1.030   pH 7.0 5.0 - 8.0   Glucose, UA >=500 (A) NEGATIVE mg/dL   Hgb urine dipstick SMALL (A) NEGATIVE   Bilirubin Urine NEGATIVE NEGATIVE   Ketones, ur NEGATIVE NEGATIVE mg/dL   Protein, ur >=300 (A) NEGATIVE mg/dL   Nitrite NEGATIVE NEGATIVE   Leukocytes, UA NEGATIVE NEGATIVE   RBC / HPF 0-5 0 - 5 RBC/hpf   WBC, UA 0-5 0 - 5 WBC/hpf   Bacteria, UA NONE SEEN NONE SEEN   Squamous Epithelial / LPF 0-5 (A) NONE SEEN  CBG monitoring, ED  Result Value Ref Range   Glucose-Capillary >600 (HH) 65 - 99 mg/dL  I-Stat arterial blood gas, ED  Result Value Ref Range   pH, Arterial 7.245  (L) 7.350 - 7.450   pCO2 arterial 44.8 32.0 - 48.0 mmHg   pO2, Arterial 75.0 (L) 83.0 - 108.0 mmHg   Bicarbonate 19.4 (L) 20.0 - 28.0 mmol/L   TCO2 21 (L) 22 - 32 mmol/L  O2 Saturation 92.0 %   Acid-base deficit 8.0 (H) 0.0 - 2.0 mmol/L   Patient temperature 98.4 F    Collection site RADIAL, ALLEN'S TEST ACCEPTABLE    Drawn by RT    Sample type ARTERIAL   I-Stat arterial blood gas, ED  Result Value Ref Range   pH, Arterial 7.322 (L) 7.350 - 7.450   pCO2 arterial 50.5 (H) 32.0 - 48.0 mmHg   pO2, Arterial 76.0 (L) 83.0 - 108.0 mmHg   Bicarbonate 26.2 20.0 - 28.0 mmol/L   TCO2 28 22 - 32 mmol/L   O2 Saturation 94.0 %   Acid-base deficit 1.0 0.0 - 2.0 mmol/L   Patient temperature 98.4 F    Collection site RADIAL, ALLEN'S TEST ACCEPTABLE    Drawn by RT    Sample type ARTERIAL    Ct Head Wo Contrast  Result Date: 03/21/2017 CLINICAL DATA:  Seizure. Altered level of consciousness (LOC), unexplained; C-spine trauma, ligamentous injury suspected. EXAM: CT HEAD WITHOUT CONTRAST CT CERVICAL SPINE WITHOUT CONTRAST TECHNIQUE: Multidetector CT imaging of the head and cervical spine was performed following the standard protocol without intravenous contrast. Multiplanar CT image reconstructions of the cervical spine were also generated. COMPARISON:  None. FINDINGS: CT HEAD FINDINGS Brain: CSF density structure in the right middle cranial fossa measures approximately 3 cm and is consistent with an arachnoid cyst. A 10 mm curvilinear fat density structure in the quadrigeminal cistern is likely an incidental lipoma. No associated mass effect. No intracranial hemorrhage or evidence of ischemia. No hydrocephalus. No subdural or intracranial fluid collection. Vascular: No hyperdense vessel. Skull: No fracture or focal lesion. Sinuses/Orbits: Pharyngeal secretions likely secondary to intubation. Paranasal sinuses and mastoid air cells are clear. The visualized orbits are unremarkable. Other: None. CT CERVICAL  SPINE FINDINGS Alignment: Reversal of normal lordosis. Skull base and vertebrae: No acute fracture. Vertebral body heights are maintained. The dens and skull base are intact. There are small subcentimeter lucencies within C2, C3, C4 and T1 vertebral bodies. Largest measures 6 mm. Soft tissues and spinal canal: No prevertebral fluid or swelling. No visible canal hematoma. Disc levels: Minimal endplate spurring at G2-R4 and C6-C7 with preservation of disc spaces. Upper chest: Dense right apical apical consolidation is partially included, slightly masslike in appearance measuring at least 2.3 cm. Other: Endotracheal and enteric tubes in place. IMPRESSION: 1. No acute intracranial abnormality. Incidental note of right middle cranial fossa arachnoid cyst and tiny quadrigeminal lipoma, typically of no clinical significance. 2. No acute fracture or subluxation of the cervical spine. 3. Small vertebral body lucencies within multiple vertebral bodies are nonspecific. This may reflect focal osteopenia or hemangiomas, however metastasis or multiple myeloma could have a similar appearance, which would be unusual given patient's age. MRI could be considered for further evaluation. 4. Right apical consolidation has a slightly mass like appearance, only partially included in the field of view. Differential considerations include pneumonia, aspiration or less likely neoplasm. This is not well seen on chest radiograph obtained today, consider chest CT characterization. Electronically Signed   By: Jeb Levering M.D.   On: 03/21/2017 22:48   Ct Cervical Spine Wo Contrast  Result Date: 03/21/2017 CLINICAL DATA:  Seizure. Altered level of consciousness (LOC), unexplained; C-spine trauma, ligamentous injury suspected. EXAM: CT HEAD WITHOUT CONTRAST CT CERVICAL SPINE WITHOUT CONTRAST TECHNIQUE: Multidetector CT imaging of the head and cervical spine was performed following the standard protocol without intravenous contrast.  Multiplanar CT image reconstructions of the cervical spine were also generated. COMPARISON:  None. FINDINGS: CT HEAD FINDINGS Brain: CSF density structure in the right middle cranial fossa measures approximately 3 cm and is consistent with an arachnoid cyst. A 10 mm curvilinear fat density structure in the quadrigeminal cistern is likely an incidental lipoma. No associated mass effect. No intracranial hemorrhage or evidence of ischemia. No hydrocephalus. No subdural or intracranial fluid collection. Vascular: No hyperdense vessel. Skull: No fracture or focal lesion. Sinuses/Orbits: Pharyngeal secretions likely secondary to intubation. Paranasal sinuses and mastoid air cells are clear. The visualized orbits are unremarkable. Other: None. CT CERVICAL SPINE FINDINGS Alignment: Reversal of normal lordosis. Skull base and vertebrae: No acute fracture. Vertebral body heights are maintained. The dens and skull base are intact. There are small subcentimeter lucencies within C2, C3, C4 and T1 vertebral bodies. Largest measures 6 mm. Soft tissues and spinal canal: No prevertebral fluid or swelling. No visible canal hematoma. Disc levels: Minimal endplate spurring at X3-K4 and C6-C7 with preservation of disc spaces. Upper chest: Dense right apical apical consolidation is partially included, slightly masslike in appearance measuring at least 2.3 cm. Other: Endotracheal and enteric tubes in place. IMPRESSION: 1. No acute intracranial abnormality. Incidental note of right middle cranial fossa arachnoid cyst and tiny quadrigeminal lipoma, typically of no clinical significance. 2. No acute fracture or subluxation of the cervical spine. 3. Small vertebral body lucencies within multiple vertebral bodies are nonspecific. This may reflect focal osteopenia or hemangiomas, however metastasis or multiple myeloma could have a similar appearance, which would be unusual given patient's age. MRI could be considered for further evaluation. 4.  Right apical consolidation has a slightly mass like appearance, only partially included in the field of view. Differential considerations include pneumonia, aspiration or less likely neoplasm. This is not well seen on chest radiograph obtained today, consider chest CT characterization. Electronically Signed   By: Jeb Levering M.D.   On: 03/21/2017 22:48   Dg Chest Portable 1 View  Result Date: 03/21/2017 CLINICAL DATA:  Endotracheal tube placement. EXAM: PORTABLE CHEST 1 VIEW COMPARISON:  None. FINDINGS: The patient's endotracheal tube is seen ending 2-3 cm above the carina. An enteric tube is noted extending below the diaphragm. A small right pleural effusion is noted. Hazy bilateral airspace opacities, right greater than left, raise concern for pulmonary edema. No pneumothorax is seen. The cardiomediastinal silhouette is borderline normal in size. No acute osseous abnormalities are identified. IMPRESSION: 1. Endotracheal tube seen ending 2-3 cm above the carina. 2. Small right pleural effusion. Hazy bilateral airspace opacities, right greater than left, raise concern for pulmonary edema. Electronically Signed   By: Garald Balding M.D.   On: 03/21/2017 21:45   EKG  EKG Interpretation  Date/Time:  Saturday March 21 2017 21:00:29 EST Ventricular Rate:  105 PR Interval:    QRS Duration: 82 QT Interval:  353 QTC Calculation: 467 R Axis:   42 Text Interpretation:  Sinus tachycardia Biatrial enlargement Probable left ventricular hypertrophy Nonspecific T abnormalities, lateral leads Anterior ST elevation, probably due to LVH No old tracing to compare Confirmed by [employer], Inocente Salles [phone]) on 03/21/2017 11:04:10 PM       Radiology No results found.  Procedures Procedure Name: Intubation Date/Time: 03/21/2017 9:28 PM Performed by: Orlie Dakin, MD Pre-anesthesia Checklist: Patient identified, Patient being monitored, Emergency Drugs available, Timeout performed and Suction  available Oxygen Delivery Method: Ambu bag Preoxygenation: Pre-oxygenation with 100% oxygen Induction Type: Rapid sequence and Cricoid Pressure applied Ventilation: Mask ventilation without difficulty Laryngoscope Size: Glidescope and 4 Grade View: Grade IV  Tube type: Subglottic suction tube Tube size: 7.5 mm Number of attempts: 4 Placement Confirmation: ETT inserted through vocal cords under direct vision,  CO2 detector and Breath sounds checked- equal and bilateral Tube secured with: ETT holder Dental Injury: Teeth and Oropharynx as per pre-operative assessment  Difficulty Due To: Difficulty was anticipated and Difficult Airway- due to anterior larynx Future Recommendations: Recommend- induction with short-acting agent, and alternative techniques readily available Comments: Patient with short neck, cords anterior difficult airway.  Patient vomited between attempts.  Suctioned immediately.  Concern for aspiration      (including critical care time) Correction procedure started 9:06 PM Medications Ordered in ED Medications  levETIRAcetam (KEPPRA) 1,000 mg in sodium chloride 0.9 % 100 mL IVPB (not administered)  LORazepam (ATIVAN) 2 MG/ML injection (not administered)  LORazepam (ATIVAN) 2 MG/ML injection (not administered)  LORazepam (ATIVAN) injection 1 mg (0.5 mg Intravenous Given 03/21/17 2024)   Patient started on propofol intravenous drip post intubation also started on insulin drip via glucose stabilizer as he is in diabetic ketoacidosis.  He was administered rocuronium 100 mg intravenously after propofol started as he continued to vomit and have posturing possibly consistent with decerebrate posturing after intubation and propofol started I consulted Dr. Jimmy Footman in the intensive care unit service for admission to the intensive care unit.  He may require neurology consult and and lcontinuous EEG monitoring to monitor for seizure activity while intubated.  Initial Impression /  Assessment and Plan / ED Course  I have reviewed the triage vital signs and the nursing notes.  Pertinent labs & imaging results that were available during my care of the patient were reviewed by me and considered in my medical decision making (see chart for details).     Patient could not cooperate with imaging despite treatment with intravenous Ativan, he became combative in the CT scanner.  He was transferred back to the emergency department so that we could perform intubation. cardene intravenous drip ordered by Cardene drip ordered by intensivist to control blood pressure. I am concerned that the patient may have aspirated given vomiting both before and after intubation. Final Clinical Impressions(s) / ED Diagnoses  Diagnosis #1 hypertensive emergency #2 new onset seizures #3 cocaine abuse #4 diabetic ketoacidosis #5 hypokalemia Final diagnoses:  None   #6 acute kidney injury CRITICAL CARE Performed by: Orlie Dakin Total critical care time: 75 minutes Critical care time was exclusive of separately billable procedures and treating other patients. Critical care was necessary to treat or prevent imminent or life-threatening deterioration. Critical care was time spent personally by me on the following activities: development of treatment plan with patient and/or surrogate as well as nursing, discussions with consultants, evaluation of patient's response to treatment, examination of patient, obtaining history from patient or surrogate, ordering and performing treatments and interventions, ordering and review of laboratory studies, ordering and review of radiographic studies, pulse oximetry and re-evaluation of patient's condition. ED Discharge Orders    None       Orlie Dakin, MD 03/21/17 2308

## 2017-03-21 NOTE — Code Documentation (Signed)
Episode of emesis X2, pt projectile vomiting

## 2017-03-21 NOTE — Progress Notes (Signed)
Pharmacy Antibiotic Note  Micheal Levy is a 36 y.o. male admitted on 03/21/2017 with sepsis.  Pharmacy has been consulted for vancomycin and zosyn dosing.  WBC 9.3 and afebrile. SCr 1.7 for estimated nCrCl ~ 60 mL/min.   Plan: Vancomycin 2g IV x1, then 750 mg IV q12hr Zosyn 3.375g IV q8hr Vancomycin trough PRN (goal 15-20 mcg/mL) Monitor renal function, clinical picture, and culture data F/u length of therapy  Height: 6' (182.9 cm) Weight: 250 lb (113.4 kg) IBW/kg (Calculated) : 77.6  Temp (24hrs), Avg:98.4 F (36.9 C), Min:98.4 F (36.9 C), Max:98.4 F (36.9 C)  Recent Labs  Lab 03/21/17 2024  WBC 9.3  CREATININE 1.70*    Estimated Creatinine Clearance: 77.3 mL/min (A) (by C-G formula based on SCr of 1.7 mg/dL (H)).    No Known Allergies  Antimicrobials this admission: 12/15 Vanc >>  12/15 Zosyn >>   Microbiology results: pending   Einar CrowKatherine Weigle, PharmD Clinical Pharmacist 03/21/17 10:50 PM

## 2017-03-21 NOTE — ED Notes (Signed)
BIB EMS from home for seizure like activity per wife. Pt has no prior hx. Pt was post ictal but alert upon EMS arrival. Pt then seized once on truck and given 2.5 Versed. Pt combative and post ictal upon arrival to ED. Pt also hypertensive and hyperglycemic with CBG >600, with no known prior hx.

## 2017-03-21 NOTE — ED Notes (Signed)
Pt unable to tolerate CT scan d/t being combative. Pt back in room, preparing to intubate.

## 2017-03-21 NOTE — Code Documentation (Signed)
Intubation attempt X4, Dr. Ethelda ChickJacubowitz and Dr. Eudelia Bunchardama at bedside

## 2017-03-22 ENCOUNTER — Inpatient Hospital Stay (HOSPITAL_COMMUNITY): Payer: Self-pay

## 2017-03-22 ENCOUNTER — Encounter (HOSPITAL_COMMUNITY): Payer: Self-pay

## 2017-03-22 ENCOUNTER — Encounter (HOSPITAL_COMMUNITY): Payer: Self-pay | Admitting: Emergency Medicine

## 2017-03-22 ENCOUNTER — Other Ambulatory Visit: Payer: Self-pay

## 2017-03-22 DIAGNOSIS — I361 Nonrheumatic tricuspid (valve) insufficiency: Secondary | ICD-10-CM

## 2017-03-22 DIAGNOSIS — Z9911 Dependence on respirator [ventilator] status: Secondary | ICD-10-CM

## 2017-03-22 DIAGNOSIS — I161 Hypertensive emergency: Secondary | ICD-10-CM

## 2017-03-22 DIAGNOSIS — N179 Acute kidney failure, unspecified: Secondary | ICD-10-CM

## 2017-03-22 DIAGNOSIS — I169 Hypertensive crisis, unspecified: Secondary | ICD-10-CM

## 2017-03-22 DIAGNOSIS — R569 Unspecified convulsions: Principal | ICD-10-CM

## 2017-03-22 LAB — PROCALCITONIN
Procalcitonin: 0.1 ng/mL
Procalcitonin: 4.55 ng/mL

## 2017-03-22 LAB — BASIC METABOLIC PANEL
Anion gap: 11 (ref 5–15)
Anion gap: 10 (ref 5–15)
Anion gap: 9 (ref 5–15)
Anion gap: 8 (ref 5–15)
BUN: 11 mg/dL (ref 6–20)
BUN: 13 mg/dL (ref 6–20)
BUN: 17 mg/dL (ref 6–20)
BUN: 20 mg/dL (ref 6–20)
CALCIUM: 8.4 mg/dL — AB (ref 8.9–10.3)
CHLORIDE: 104 mmol/L (ref 101–111)
CHLORIDE: 104 mmol/L (ref 101–111)
CO2: 23 mmol/L (ref 22–32)
CO2: 21 mmol/L — ABNORMAL LOW (ref 22–32)
CO2: 22 mmol/L (ref 22–32)
CO2: 24 mmol/L (ref 22–32)
CREATININE: 2.42 mg/dL — AB (ref 0.61–1.24)
CREATININE: 2.69 mg/dL — AB (ref 0.61–1.24)
CREATININE: 2.86 mg/dL — AB (ref 0.61–1.24)
CREATININE: 2.7 mg/dL — AB (ref 0.61–1.24)
Calcium: 8 mg/dL — ABNORMAL LOW (ref 8.9–10.3)
Calcium: 7.9 mg/dL — ABNORMAL LOW (ref 8.9–10.3)
Calcium: 8 mg/dL — ABNORMAL LOW (ref 8.9–10.3)
Chloride: 105 mmol/L (ref 101–111)
Chloride: 105 mmol/L (ref 101–111)
GFR calc Af Amer: 37 mL/min — ABNORMAL LOW (ref >60)
GFR calc Af Amer: 33 mL/min — ABNORMAL LOW (ref >60)
GFR calc Af Amer: 31 mL/min — ABNORMAL LOW (ref >60)
GFR calc Af Amer: 33 mL/min — ABNORMAL LOW (ref >60)
GFR calc non Af Amer: 26 mL/min — ABNORMAL LOW (ref >60)
GFR calc non Af Amer: 28 mL/min — ABNORMAL LOW (ref >60)
GFR, EST NON AFRICAN AMERICAN: 32 mL/min — AB (ref >60)
GFR, EST NON AFRICAN AMERICAN: 28 mL/min — AB (ref >60)
Glucose, Bld: 190 mg/dL — ABNORMAL HIGH (ref 65–99)
Glucose, Bld: 214 mg/dL — ABNORMAL HIGH (ref 65–99)
Glucose, Bld: 294 mg/dL — ABNORMAL HIGH (ref 65–99)
Glucose, Bld: 172 mg/dL — ABNORMAL HIGH (ref 65–99)
POTASSIUM: 3.4 mmol/L — AB (ref 3.5–5.1)
Potassium: 4.1 mmol/L (ref 3.5–5.1)
Potassium: 4.3 mmol/L (ref 3.5–5.1)
Potassium: 4.1 mmol/L (ref 3.5–5.1)
SODIUM: 139 mmol/L (ref 135–145)
SODIUM: 136 mmol/L (ref 135–145)
SODIUM: 135 mmol/L (ref 135–145)
SODIUM: 136 mmol/L (ref 135–145)

## 2017-03-22 LAB — LACTIC ACID, PLASMA
LACTIC ACID, VENOUS: 3 mmol/L — AB (ref 0.5–1.9)
LACTIC ACID, VENOUS: 3.8 mmol/L — AB (ref 0.5–1.9)

## 2017-03-22 LAB — BLOOD GAS, ARTERIAL
ACID-BASE EXCESS: 1 mmol/L (ref 0.0–2.0)
Acid-Base Excess: 1.1 mmol/L (ref 0.0–2.0)
BICARBONATE: 25.3 mmol/L (ref 20.0–28.0)
BICARBONATE: 25.8 mmol/L (ref 20.0–28.0)
Drawn by: 252031
Drawn by: 448981
FIO2: 40
FIO2: 40
LHR: 24 {breaths}/min
MECHVT: 550 mL
O2 Saturation: 96.6 %
O2 Saturation: 93.9 %
PEEP/CPAP: 10 cmH2O
PEEP: 12 cmH2O
PO2 ART: 81.7 mmHg — AB (ref 83.0–108.0)
PO2 ART: 71.5 mmHg — AB (ref 83.0–108.0)
Patient temperature: 98.6
Patient temperature: 98.6
RATE: 24 resp/min
VT: 400 mL
pCO2 arterial: 41 mmHg (ref 32.0–48.0)
pCO2 arterial: 46.8 mmHg (ref 32.0–48.0)
pH, Arterial: 7.407 (ref 7.350–7.450)
pH, Arterial: 7.361 (ref 7.350–7.450)

## 2017-03-22 LAB — GLUCOSE, CAPILLARY
GLUCOSE-CAPILLARY: 232 mg/dL — AB (ref 65–99)
GLUCOSE-CAPILLARY: 197 mg/dL — AB (ref 65–99)
GLUCOSE-CAPILLARY: 156 mg/dL — AB (ref 65–99)
GLUCOSE-CAPILLARY: 106 mg/dL — AB (ref 65–99)
Glucose-Capillary: 147 mg/dL — ABNORMAL HIGH (ref 65–99)
Glucose-Capillary: 255 mg/dL — ABNORMAL HIGH (ref 65–99)
Glucose-Capillary: 310 mg/dL — ABNORMAL HIGH (ref 65–99)
Glucose-Capillary: 174 mg/dL — ABNORMAL HIGH (ref 65–99)
Glucose-Capillary: 170 mg/dL — ABNORMAL HIGH (ref 65–99)
Glucose-Capillary: 222 mg/dL — ABNORMAL HIGH (ref 65–99)
Glucose-Capillary: 149 mg/dL — ABNORMAL HIGH (ref 65–99)

## 2017-03-22 LAB — I-STAT CG4 LACTIC ACID, ED: LACTIC ACID, VENOUS: 5.16 mmol/L — AB (ref 0.5–1.9)

## 2017-03-22 LAB — ECHOCARDIOGRAM COMPLETE
HEIGHTINCHES: 67 in
WEIGHTICAEL: 3629.65 [oz_av]

## 2017-03-22 LAB — LIPID PANEL
CHOL/HDL RATIO: 3.6 ratio
CHOLESTEROL: 83 mg/dL (ref 0–200)
HDL: 23 mg/dL — AB (ref >40)
LDL Cholesterol: 25 mg/dL (ref 0–99)
TRIGLYCERIDES: 174 mg/dL — AB (ref <150)
VLDL: 35 mg/dL (ref 0–40)

## 2017-03-22 LAB — MRSA PCR SCREENING: MRSA by PCR: NEGATIVE

## 2017-03-22 LAB — TSH: TSH: 0.812 u[IU]/mL (ref 0.350–4.500)

## 2017-03-22 LAB — CBC
HCT: 40.7 % (ref 39.0–52.0)
Hemoglobin: 13.7 g/dL (ref 13.0–17.0)
MCH: 25.2 pg — ABNORMAL LOW (ref 26.0–34.0)
MCHC: 33.7 g/dL (ref 30.0–36.0)
MCV: 74.8 fL — AB (ref 78.0–100.0)
PLATELETS: 242 10*3/uL (ref 150–400)
RBC: 5.44 MIL/uL (ref 4.22–5.81)
RDW: 15 % (ref 11.5–15.5)
WBC: 13.4 10*3/uL — AB (ref 4.0–10.5)

## 2017-03-22 LAB — TROPONIN I
TROPONIN I: 0.36 ng/mL — AB (ref <0.03)
Troponin I: 0.07 ng/mL (ref <0.03)
Troponin I: 0.36 ng/mL (ref <0.03)

## 2017-03-22 LAB — BRAIN NATRIURETIC PEPTIDE: B Natriuretic Peptide: 331.4 pg/mL — ABNORMAL HIGH (ref 0.0–100.0)

## 2017-03-22 LAB — INFLUENZA PANEL BY PCR (TYPE A & B)
Influenza A By PCR: NEGATIVE
Influenza B By PCR: NEGATIVE

## 2017-03-22 LAB — CK: CK TOTAL: 445 U/L — AB (ref 49–397)

## 2017-03-22 LAB — CBG MONITORING, ED
GLUCOSE-CAPILLARY: 548 mg/dL — AB (ref 65–99)
Glucose-Capillary: >600 mg/dL (ref 65–99)
Glucose-Capillary: 371 mg/dL — ABNORMAL HIGH (ref 65–99)
Glucose-Capillary: 266 mg/dL — ABNORMAL HIGH (ref 65–99)

## 2017-03-22 LAB — TRIGLYCERIDES: Triglycerides: 268 mg/dL — ABNORMAL HIGH (ref <150)

## 2017-03-22 LAB — PHOSPHORUS: PHOSPHORUS: 4.4 mg/dL (ref 2.5–4.6)

## 2017-03-22 LAB — HIV ANTIBODY (ROUTINE TESTING W REFLEX): HIV SCREEN 4TH GENERATION: NONREACTIVE

## 2017-03-22 LAB — ETHANOL

## 2017-03-22 LAB — OSMOLALITY, URINE: Osmolality, Ur: 480 mOsm/kg (ref 300–900)

## 2017-03-22 LAB — SODIUM, URINE, RANDOM: Sodium, Ur: 18 mmol/L

## 2017-03-22 LAB — OSMOLALITY: Osmolality: 306 mOsm/kg — ABNORMAL HIGH (ref 275–295)

## 2017-03-22 LAB — MAGNESIUM
MAGNESIUM: 1.9 mg/dL (ref 1.7–2.4)
MAGNESIUM: 2.4 mg/dL (ref 1.7–2.4)

## 2017-03-22 LAB — BETA-HYDROXYBUTYRIC ACID: BETA-HYDROXYBUTYRIC ACID: 0.1 mmol/L (ref 0.05–0.27)

## 2017-03-22 MED ORDER — POTASSIUM CHLORIDE 10 MEQ/100ML IV SOLN: 10.0000 meq | INTRAVENOUS | Status: DC

## 2017-03-22 MED ORDER — PROPOFOL 1000 MG/100ML IV EMUL
INTRAVENOUS | Status: AC
  Administered 2017-03-22: 30 ug/kg/min via INTRAVENOUS
  Filled 2017-03-22: qty 100

## 2017-03-22 MED ORDER — LACTATED RINGERS IV BOLUS (SEPSIS)
1000.0000 mL | Freq: Once | INTRAVENOUS | Status: AC
  Administered 2017-03-22: 1000 mL via INTRAVENOUS

## 2017-03-22 MED ORDER — ORAL CARE MOUTH RINSE
15.0000 mL | OROMUCOSAL | Status: DC
  Administered 2017-03-22 – 2017-03-24 (×18): 15 mL via OROMUCOSAL

## 2017-03-22 MED ORDER — CHLORHEXIDINE GLUCONATE 0.12% ORAL RINSE (MEDLINE KIT)
15.0000 mL | Freq: Two times a day (BID) | OROMUCOSAL | Status: DC
  Administered 2017-03-22 – 2017-03-23 (×4): 15 mL via OROMUCOSAL

## 2017-03-22 MED ORDER — MAGNESIUM SULFATE 2 GM/50ML IV SOLN
2.0000 g | Freq: Once | INTRAVENOUS | Status: AC
  Administered 2017-03-22: 2 g via INTRAVENOUS
  Filled 2017-03-22: qty 50

## 2017-03-22 MED ORDER — ACETAMINOPHEN 160 MG/5ML PO SOLN: 325.0000 mg | Freq: Four times a day (QID) | ORAL | Status: DC | PRN

## 2017-03-22 MED ORDER — INSULIN ASPART 100 UNIT/ML ~~LOC~~ SOLN
0.0000 [IU] | SUBCUTANEOUS | Status: DC
  Administered 2017-03-22: 8 [IU] via SUBCUTANEOUS
  Administered 2017-03-22: 11 [IU] via SUBCUTANEOUS

## 2017-03-22 MED ORDER — ACETAMINOPHEN 325 MG PO TABS
650.0000 mg | ORAL_TABLET | Freq: Four times a day (QID) | ORAL | Status: DC | PRN
  Administered 2017-03-26: 650 mg via ORAL
  Filled 2017-03-22: qty 2

## 2017-03-22 MED ORDER — PNEUMOCOCCAL VAC POLYVALENT 25 MCG/0.5ML IJ INJ
0.5000 mL | INJECTION | INTRAMUSCULAR | Status: AC
  Administered 2017-03-23: 0.5 mL via INTRAMUSCULAR

## 2017-03-22 MED ORDER — ACETAMINOPHEN 650 MG RE SUPP: 650.0000 mg | Freq: Four times a day (QID) | RECTAL | Status: DC | PRN

## 2017-03-22 MED ORDER — LACTATED RINGERS IV SOLN
INTRAVENOUS | Status: DC
  Administered 2017-03-22 – 2017-03-23 (×2): via INTRAVENOUS

## 2017-03-22 MED ORDER — FONDAPARINUX SODIUM 2.5 MG/0.5ML ~~LOC~~ SOLN
2.5000 mg | Freq: Every day | SUBCUTANEOUS | Status: DC
  Administered 2017-03-22 – 2017-03-26 (×5): 2.5 mg via SUBCUTANEOUS
  Filled 2017-03-22 (×5): qty 0.5

## 2017-03-22 MED ORDER — ASPIRIN 81 MG PO CHEW
81.0000 mg | CHEWABLE_TABLET | Freq: Every day | ORAL | Status: DC
  Administered 2017-03-22 – 2017-03-25 (×4): 81 mg
  Filled 2017-03-22 (×4): qty 1

## 2017-03-22 MED ORDER — INSULIN DETEMIR 100 UNIT/ML ~~LOC~~ SOLN
7.0000 [IU] | Freq: Two times a day (BID) | SUBCUTANEOUS | Status: DC
  Administered 2017-03-22: 7 [IU] via SUBCUTANEOUS
  Filled 2017-03-22 (×2): qty 0.07

## 2017-03-22 MED ORDER — VANCOMYCIN HCL 10 G IV SOLR
1250.0000 mg | INTRAVENOUS | Status: DC
  Administered 2017-03-23: 1250 mg via INTRAVENOUS
  Filled 2017-03-22 (×2): qty 1250

## 2017-03-22 MED ORDER — SODIUM CHLORIDE 0.9 % IV SOLN
INTRAVENOUS | Status: DC
  Administered 2017-03-22: 1.4 [IU]/h via INTRAVENOUS
  Filled 2017-03-22: qty 1

## 2017-03-22 MED ORDER — FENTANYL 2500MCG IN NS 250ML (10MCG/ML) PREMIX INFUSION
0.0000 ug/h | INTRAVENOUS | Status: DC
  Administered 2017-03-22: 50 ug/h via INTRAVENOUS
  Administered 2017-03-23: 75 ug/h via INTRAVENOUS
  Filled 2017-03-22 (×2): qty 250

## 2017-03-22 MED ORDER — POTASSIUM PHOSPHATES 15 MMOLE/5ML IV SOLN
40.0000 meq | Freq: Once | INTRAVENOUS | Status: AC
  Administered 2017-03-22: 40 meq via INTRAVENOUS
  Filled 2017-03-22: qty 9.09

## 2017-03-22 MED ORDER — SODIUM CHLORIDE 0.9 % IV BOLUS (SEPSIS)
1000.0000 mL | Freq: Once | INTRAVENOUS | Status: AC
  Administered 2017-03-22: 1000 mL via INTRAVENOUS

## 2017-03-22 MED ORDER — INFLUENZA VAC SPLIT QUAD 0.5 ML IM SUSY
0.5000 mL | PREFILLED_SYRINGE | INTRAMUSCULAR | Status: AC
  Administered 2017-03-23: 0.5 mL via INTRAMUSCULAR

## 2017-03-22 MED ORDER — SODIUM GLYCEROPHOSPHATE 1 MMOLE/ML IV SOLN
20.0000 mmol | Freq: Once | INTRAVENOUS | Status: DC
  Filled 2017-03-22: qty 20

## 2017-03-22 MED ORDER — ACETAMINOPHEN 160 MG/5ML PO SOLN
650.0000 mg | Freq: Four times a day (QID) | ORAL | Status: DC | PRN
  Administered 2017-03-22: 650 mg
  Filled 2017-03-22: qty 20.3

## 2017-03-22 NOTE — Progress Notes (Signed)
Received call from Montez MoritaAnnette Storr, she states she is pt's wife. She wants to change pw to "Aug 21". She wants to have the "fiance" Lawanna Kobusngel, removed from room once she arrives. Per Pts brother and Lawanna Kobusngel, they thought pt was divorced. Social work and [employer] called for further assistance. Security notified as well to assist as needed.

## 2017-03-22 NOTE — ED Notes (Signed)
Admitting has been paged regarding lactic results and BP, waiting for response

## 2017-03-22 NOTE — Progress Notes (Signed)
CSW went to speak with patient's fiance, Lawanna Kobusngel who was present at bedside regarding insurance questions. Lawanna Kobusngel reported that patient started a new job recently approximately three months ago and has insurance benefits but has not yet received his ID cards in the mail. CSW informed Lawanna Kobusngel that she would need to contact his employer to determine which insurance company the policy is through and to contact the insurance company's customer service line directly to get the subscriber ID and group numbers. Lawanna Kobusngel stated understanding and agreement.  CSW informed Lawanna Kobusngel that if she has issues obtaining insurance policy information, to please reach out to a nurse who could contact CSW for assistance.  CSW signing off at this time.  Edwin Dadaarol Missey Hasley, MSW, LCSW-A Weekend Clinical Social Worker [phone]

## 2017-03-22 NOTE — Progress Notes (Signed)
eLink Physician-Brief Progress Note Patient Name: Micheal Levy DOB: Nov 06, 1980 MRN: [id number]   Date of Service  03/22/2017  HPI/Events of Note  hypophos  eICU Interventions  Phos replaced     Intervention Category Intermediate Interventions: Electrolyte abnormality - evaluation and management  DETERDING,ELIZABETH 03/22/2017, 5:44 AM

## 2017-03-22 NOTE — Progress Notes (Signed)
EEG Completed; Results Pending  

## 2017-03-22 NOTE — Procedures (Addendum)
History: 36 year old male being evaluated for seizures  Sedation: Propofol  Technique: This is a 21 channel routine scalp EEG performed at the bedside with bipolar and monopolar montages arranged in accordance to the international 10/20 system of electrode placement. One channel was dedicated to EKG recording.    Background: The background consists of  alpha and beta activities resembling sleep spindles with brief attenuations lasting less than 1 second.  Photic stimulation: Physiologic driving is not performed  EEG Abnormalities: 1) sedated EEG  Clinical Interpretation: This EEG is consistent with the patient's sedated state.   There was no seizure or seizure predisposition recorded on this study. Please note that a normal EEG does not preclude the possibility of epilepsy.   Micheal SlotMcNeill Ishmeal Rorie, MD [facility] [phone]  If 7pm- 7am, please page neurology on call as listed in AMION.

## 2017-03-22 NOTE — Progress Notes (Signed)
Pharmacy Antibiotic Note  Micheal ChihuahuaShuron Levy is a 36 y.o. male admitted on 03/21/2017 with sepsis.  Pharmacy managing vancomycin and zosyn dosing.  WBC 9.3>13.4. SCr increased to 2.69 for estimated nCrCl ~ 35-40 mL/min.   Plan: Decrease Vancomycin to 1250 mg Q 24 hours  Zosyn 3.375g IV q8hr Vancomycin trough PRN (goal 15-20 mcg/mL) Monitor renal function, clinical picture, and culture data F/u length of therapy  Height: 5\' 7"  (170.2 cm) Weight: 226 lb 13.7 oz (102.9 kg) IBW/kg (Calculated) : 66.1  Temp (24hrs), Avg:99.9 F (37.7 C), Min:98.4 F (36.9 C), Max:103.2 F (39.6 C)  Recent Labs  Lab 03/21/17 2024 03/21/17 2148 03/21/17 2210 03/21/17 2256 03/22/17 0048 03/22/17 0357 03/22/17 0801  WBC 9.3 25.0*  --   --   --  13.4*  --   CREATININE 1.70*  --  1.41*  --   --  2.42* 2.69*  LATICACIDVEN  --   --   --  3.0* 5.16* 3.8*  --     Estimated Creatinine Clearance: 42.6 mL/min (A) (by C-G formula based on SCr of 2.69 mg/dL (H)).    No Known Allergies  Antimicrobials this admission: 12/15 Vanc >>  12/15 Zosyn >>   Microbiology results: pending   Vinnie LevelBenjamin Damin Levy, PharmD., BCPS Clinical Pharmacist Pager [phone]

## 2017-03-22 NOTE — H&P (Signed)
PULMONARY / CRITICAL CARE MEDICINE   Name: Micheal Levy MRN: [id number] DOB: [birth year]    ADMISSION DATE:  03/21/2017 CONSULTATION DATE:  03/21/2017  REFERRING MD:  Dr. Winfred Leeds   CHIEF COMPLAINT:  Seizure   HISTORY OF PRESENT ILLNESS:   36 year old male with PMH HTN   Presents to ED after 2 witnessed seizures. EMS gave 2.5 Versed. Upon arrival BP 239/134, patient altered and combative requiring intubation. CT Head negative. Given Keppra 1gm. Glucose 773. WBC 9.3. +Cocaine. PCCM asked to admit.   PAST MEDICAL HISTORY :  He  has a past medical history of Hypertension.  PAST SURGICAL HISTORY: He  has no past surgical history on file.  No Known Allergies  No current facility-administered medications on file prior to encounter.    Current Outpatient Medications on File Prior to Encounter  Medication Sig  . albuterol (PROVENTIL HFA;VENTOLIN HFA) 108 (90 Base) MCG/ACT inhaler Inhale 2 puffs into the lungs every 6 (six) hours as needed for wheezing or shortness of breath.  [name] albuterol (PROVENTIL) (2.5 MG/3ML) 0.083% nebulizer solution Take 2.5 mg by nebulization every 6 (six) hours as needed for wheezing or shortness of breath.  [name] aspirin EC 81 MG tablet Take 81 mg by mouth daily as needed (pain/headache).  . hydrochlorothiazide (HYDRODIURIL) 25 MG tablet Take 25 mg by mouth daily.    FAMILY HISTORY:  His has no family status information on file.    SOCIAL HISTORY: He  reports that he has been smoking cigarettes.  he has never used smokeless tobacco. He reports that he drinks alcohol. He reports that he uses drugs. Drugs: Cocaine and Marijuana.  REVIEW OF SYSTEMS:   Unable to review as patient is encephalopathic   SUBJECTIVE:   VITAL SIGNS: BP (!) 127/102   Pulse 93   Temp 98.4 F (36.9 C) (Rectal)   Resp (!) 39   Ht 6' (1.829 m)   Wt 113.4 kg (250 lb)   SpO2 (!) 89%   BMI 33.91 kg/m   HEMODYNAMICS:    VENTILATOR SETTINGS: Vent Mode: PRVC FiO2 (%):  [100  %] 100 % Set Rate:  [24 bmp] 24 bmp Vt Set:  [550 mL] 550 mL PEEP:  [5 cmH20-12 cmH20] 12 cmH20 Plateau Pressure:  [27 cmH20-33 cmH20] 33 cmH20  INTAKE / OUTPUT: No intake/output data recorded.  PHYSICAL EXAMINATION: General:  Adult male, on vent  Neuro:  Sedated, pupils sluggish and pin-point, withdrawals to pain  HEENT:  ETT in place  Cardiovascular:  Tachy, no MRG  Lungs:  Diminished to bases, no wheeze/crackles  Abdomen:  Obese, active bowel sounds  Musculoskeletal:  -edema  Skin:  Warm, dry, intact   LABS:  BMET Recent Labs  Lab 03/21/17 2024 03/21/17 2210  NA 130* 130*  K 3.3* 4.4  CL 92* 95*  CO2 19* 23  BUN 10 11  CREATININE 1.70* 1.41*  GLUCOSE 773* 594*    Electrolytes Recent Labs  Lab 03/21/17 2024 03/21/17 2210  CALCIUM 8.9 8.6*    CBC Recent Labs  Lab 03/21/17 2024 03/21/17 2148  WBC 9.3 25.0*  HGB 15.5 15.9  HCT 45.7 46.6  PLT 232 233    Coag's No results for input(s): APTT, INR in the last 168 hours.  Sepsis Markers Recent Labs  Lab 03/21/17 2256  LATICACIDVEN 3.0*    ABG Recent Labs  Lab 03/21/17 2024 03/21/17 2224  PHART 7.245* 7.322*  PCO2ART 44.8 50.5*  PO2ART 75.0* 76.0*    Liver Enzymes Recent  Labs  Lab 03/21/17 2024  AST 35  ALT 28  ALKPHOS 137*  BILITOT 0.7  ALBUMIN 4.2    Cardiac Enzymes Recent Labs  Lab 03/21/17 2230  TROPONINI 0.07*    Glucose Recent Labs  Lab 03/21/17 2011 03/21/17 2315 03/22/17 0025  GLUCAP >600* >600* 548*    Imaging Ct Head Wo Contrast  Result Date: 03/21/2017 CLINICAL DATA:  Seizure. Altered level of consciousness (LOC), unexplained; C-spine trauma, ligamentous injury suspected. EXAM: CT HEAD WITHOUT CONTRAST CT CERVICAL SPINE WITHOUT CONTRAST TECHNIQUE: Multidetector CT imaging of the head and cervical spine was performed following the standard protocol without intravenous contrast. Multiplanar CT image reconstructions of the cervical spine were also generated.  COMPARISON:  None. FINDINGS: CT HEAD FINDINGS Brain: CSF density structure in the right middle cranial fossa measures approximately 3 cm and is consistent with an arachnoid cyst. A 10 mm curvilinear fat density structure in the quadrigeminal cistern is likely an incidental lipoma. No associated mass effect. No intracranial hemorrhage or evidence of ischemia. No hydrocephalus. No subdural or intracranial fluid collection. Vascular: No hyperdense vessel. Skull: No fracture or focal lesion. Sinuses/Orbits: Pharyngeal secretions likely secondary to intubation. Paranasal sinuses and mastoid air cells are clear. The visualized orbits are unremarkable. Other: None. CT CERVICAL SPINE FINDINGS Alignment: Reversal of normal lordosis. Skull base and vertebrae: No acute fracture. Vertebral body heights are maintained. The dens and skull base are intact. There are small subcentimeter lucencies within C2, C3, C4 and T1 vertebral bodies. Largest measures 6 mm. Soft tissues and spinal canal: No prevertebral fluid or swelling. No visible canal hematoma. Disc levels: Minimal endplate spurring at Y0-D9 and C6-C7 with preservation of disc spaces. Upper chest: Dense right apical apical consolidation is partially included, slightly masslike in appearance measuring at least 2.3 cm. Other: Endotracheal and enteric tubes in place. IMPRESSION: 1. No acute intracranial abnormality. Incidental note of right middle cranial fossa arachnoid cyst and tiny quadrigeminal lipoma, typically of no clinical significance. 2. No acute fracture or subluxation of the cervical spine. 3. Small vertebral body lucencies within multiple vertebral bodies are nonspecific. This may reflect focal osteopenia or hemangiomas, however metastasis or multiple myeloma could have a similar appearance, which would be unusual given patient's age. MRI could be considered for further evaluation. 4. Right apical consolidation has a slightly mass like appearance, only partially  included in the field of view. Differential considerations include pneumonia, aspiration or less likely neoplasm. This is not well seen on chest radiograph obtained today, consider chest CT characterization. Electronically Signed   By: Jeb Levering M.D.   On: 03/21/2017 22:48   Ct Cervical Spine Wo Contrast  Result Date: 03/21/2017 CLINICAL DATA:  Seizure. Altered level of consciousness (LOC), unexplained; C-spine trauma, ligamentous injury suspected. EXAM: CT HEAD WITHOUT CONTRAST CT CERVICAL SPINE WITHOUT CONTRAST TECHNIQUE: Multidetector CT imaging of the head and cervical spine was performed following the standard protocol without intravenous contrast. Multiplanar CT image reconstructions of the cervical spine were also generated. COMPARISON:  None. FINDINGS: CT HEAD FINDINGS Brain: CSF density structure in the right middle cranial fossa measures approximately 3 cm and is consistent with an arachnoid cyst. A 10 mm curvilinear fat density structure in the quadrigeminal cistern is likely an incidental lipoma. No associated mass effect. No intracranial hemorrhage or evidence of ischemia. No hydrocephalus. No subdural or intracranial fluid collection. Vascular: No hyperdense vessel. Skull: No fracture or focal lesion. Sinuses/Orbits: Pharyngeal secretions likely secondary to intubation. Paranasal sinuses and mastoid air cells are  clear. The visualized orbits are unremarkable. Other: None. CT CERVICAL SPINE FINDINGS Alignment: Reversal of normal lordosis. Skull base and vertebrae: No acute fracture. Vertebral body heights are maintained. The dens and skull base are intact. There are small subcentimeter lucencies within C2, C3, C4 and T1 vertebral bodies. Largest measures 6 mm. Soft tissues and spinal canal: No prevertebral fluid or swelling. No visible canal hematoma. Disc levels: Minimal endplate spurring at W5-I6 and C6-C7 with preservation of disc spaces. Upper chest: Dense right apical apical  consolidation is partially included, slightly masslike in appearance measuring at least 2.3 cm. Other: Endotracheal and enteric tubes in place. IMPRESSION: 1. No acute intracranial abnormality. Incidental note of right middle cranial fossa arachnoid cyst and tiny quadrigeminal lipoma, typically of no clinical significance. 2. No acute fracture or subluxation of the cervical spine. 3. Small vertebral body lucencies within multiple vertebral bodies are nonspecific. This may reflect focal osteopenia or hemangiomas, however metastasis or multiple myeloma could have a similar appearance, which would be unusual given patient's age. MRI could be considered for further evaluation. 4. Right apical consolidation has a slightly mass like appearance, only partially included in the field of view. Differential considerations include pneumonia, aspiration or less likely neoplasm. This is not well seen on chest radiograph obtained today, consider chest CT characterization. Electronically Signed   By: Jeb Levering M.D.   On: 03/21/2017 22:48   Dg Chest Portable 1 View  Result Date: 03/21/2017 CLINICAL DATA:  Endotracheal tube placement. EXAM: PORTABLE CHEST 1 VIEW COMPARISON:  None. FINDINGS: The patient's endotracheal tube is seen ending 2-3 cm above the carina. An enteric tube is noted extending below the diaphragm. A small right pleural effusion is noted. Hazy bilateral airspace opacities, right greater than left, raise concern for pulmonary edema. No pneumothorax is seen. The cardiomediastinal silhouette is borderline normal in size. No acute osseous abnormalities are identified. IMPRESSION: 1. Endotracheal tube seen ending 2-3 cm above the carina. 2. Small right pleural effusion. Hazy bilateral airspace opacities, right greater than left, raise concern for pulmonary edema. Electronically Signed   By: Garald Balding M.D.   On: 03/21/2017 21:45     STUDIES:  CXR 12/15 > 1. Endotracheal tube seen ending 2-3 cm above  the carina. Small right pleural effusion. Hazy bilateral airspace opacities, right greater than left, raise concern for pulmonary edema CT Head/C-Spine 12/15 > 1. No acute intracranial abnormality. Incidental note of right middle cranial fossa arachnoid cyst and tiny quadrigeminal lipoma, typically of no clinical significance. 2. No acute fracture or subluxation of the cervical spine. 3. Small vertebral body lucencies within multiple vertebral bodies are nonspecific. This may reflect focal osteopenia or hemangiomas, however metastasis or multiple myeloma could have a similar appearance, which would be unusual given patient's age. MRI could be considered for further evaluation. 4. Right apical consolidation has a slightly mass like appearance, only partially included in the field of view. Differential considerations include pneumonia, aspiration or less likely neoplasm.  CT Chest 12/16 >> MRI Brain 12/16 >>   CULTURES: U/A 12/15 > No bacteria  Blood 12/16 >> Sputum 12/16 >>   ANTIBIOTICS: Vancomycin 12/15 >> Zosyn 12/15 >>  SIGNIFICANT EVENTS: 12/15 > Presents to ED   LINES/TUBES: ETT 12/15   DISCUSSION: 36 year old male presents to ED after 2 witnessed seizures. Intubated in ED.   ASSESSMENT / PLAN:  PULMONARY A: Ventilator dependent respiratory failure secondary to Acute Hypoxic Respiratory Failure secondary to encephalopathy/ seizures Aspiration Event  P:  Vent Support VAP Bundle  Trend CXR/ABG  CT Chest shows B/L pleural effusion and B/L haziness. Difficulty at this time to say if infectious at this time vs chemical pneumonitis from aspiration or CHF. Will cover with abx for now and follow up cultures  Influenza negative   CARDIOVASCULAR A:  HTN Emergency  H/O HTN  P:  Cardiac Monitoring  Cardene gtt (maintain systolic 098-119)  Trend Troponin  BNP pending  ECHO pending   RENAL A:   Anion Gap Metabolic Acidosis secondary to lactic acidosis   Hypernatremia = 146 when corrected for glucose  Acute renal failure secondary to pre-renal vs renal (hypertensive nephrosclerosis)  LA 3.0 > 5.16  Osmo   Hypokalemia  K 3.3 >  P:   Trend BMP  Replace electrolytes as indicated  Trend LA  Urine/Serum Osmo normal. No osmolar gap Urine NA pending  US renal   GASTROINTESTINAL A:   SUP P:   NPO PPI   HEMATOLOGIC A:   No issues  P:  Trend CBC   INFECTIOUS A:   Aspiration Event  -U/A negative P:   Trend WBC and Fever Curve  Trend LA and PCT  Follow Culture Data Vancomycin and Zosyn  Flu pending   ENDOCRINE A:   Hyperglycemia in setting of HONK -Beta H negative, Ketones negative      P:   Trend Glucose  Insulin Gtt  Start on Levemir when BS <250  NEUROLOGIC A:   Encephalopathy in setting of PRES vs Polysubstance abuse vs Hyperglycemia  Seizures in setting of hyperglycemia vs Cocaine use  Polysubstance Abuse  +Cocaine on UDS  P:   RASS goal: 0/-1 Wean Propofol gtt to achieve RASS, Added fentanyl for pain control  Spoke with Neurology whom advised to continue keppra until EEG resulted  Fentanyl PRN  MRI Brain pending- evaluate for PRESS vs CVA CT Head negative  Loaded with 1 gram Keppra  Keppra BID  EEG in AM    FAMILY  - Updates: Family updated at bedside   - Inter-disciplinary family meet or Palliative Care meeting due by: 03/28/2017   CC Time: 24 minutes   Hayden Pedro, AGACNP-BC [facility]  Pgr: [phone]  PCCM Pgr: Dortches D.O [facility] Pager: [phone]

## 2017-03-22 NOTE — Progress Notes (Signed)
  Echocardiogram 2D Echocardiogram has been performed.  Micheal Levy 03/22/2017, 2:09 PM 

## 2017-03-22 NOTE — ED Notes (Signed)
RN Delorise Jacksonori is given a copy of lactic acid. She states she will inform the admitting phy of pt lactic acid 5.16

## 2017-03-22 NOTE — Progress Notes (Signed)
eLink Physician-Brief Progress Note Patient Name: Dorien ChihuahuaShuron Sigmund DOB: 25-Jan-1981 MRN: [id number]   Date of Service  03/22/2017  HPI/Events of Note  Oliguria   eICU Interventions  Will bolus with 0.9 NaCl 1 liter IV over 1 hour now.      Intervention Category Intermediate Interventions: Oliguria - evaluation and management  Antasia Haider Eugene 03/22/2017, 10:30 PM

## 2017-03-22 NOTE — Consult Note (Signed)
Neurology Consultation Reason for Consult: Seizures Referring Physician: SIva P  CC: Seizures  History is obtained from: Girlfriend  HPI: Micheal Levy is a 36 y.o. male who had 2 seizures yesterday.  He has never had a seizure before according to his girlfriend, noted to have any family history.  He was in his normal state of health earlier in the day, but then was seen to have a witnessed seizure.  He woke up and began talking in between seizures, then subsequently had a second seizure.  On arrival, his glucose was 773 and he was cocaine positive.  His blood pressure was 270 systolic.  He was intubated and admitted to the intensive care unit.  No further seizures.  Shortly before my exam, he had sedation held, he became very agitated, was moving both extremities well and purposefully, but had received bolus doses of his sedation.    ROS:   Unable to obtain due to altered mental status.   Past Medical History:  Diagnosis Date  . Hypertension      History reviewed. No pertinent family history.   Social History:  reports that he has been smoking cigarettes.  he has never used smokeless tobacco. He reports that he drinks alcohol. He reports that he uses drugs. Drugs: Cocaine and Marijuana.   Exam: Current vital signs: BP 125/79   Pulse 74   Temp 98.7 F (37.1 C) (Axillary)   Resp (!) 26   Ht 5\' 7"  (1.702 m)   Wt 102.9 kg (226 lb 13.7 oz)   SpO2 94%   BMI 35.53 kg/m  Vital signs in last 24 hours: Temp:  [98.4 F (36.9 C)-103.2 F (39.6 C)] 98.7 F (37.1 C) (12/16 1200) Pulse Rate:  [74-135] 74 (12/16 1400) Resp:  [21-42] 26 (12/16 1400) BP: (89-239)/(59-163) 125/79 (12/16 1400) SpO2:  [83 %-100 %] 94 % (12/16 1400) FiO2 (%):  [40 %-100 %] 40 % (12/16 1116) Weight:  [102.9 kg (226 lb 13.7 oz)-113.4 kg (250 lb)] 102.9 kg (226 lb 13.7 oz) (12/16 0329)   Physical Exam  Constitutional: Appears well-developed and well-nourished.  Psych: Affect appropriate to  situation Eyes: No scleral injection HENT: No OP obstrucion Head: Normocephalic.  Cardiovascular: Normal rate and regular rhythm.  Respiratory: Effort normal, non-labored breathing GI: Soft.  No distension. There is no tenderness.  Skin: Multiple tattoos   Confounded by heavy sedation Neuro: Mental Status: Patient is obtunded, does not open eyes or follow commands Cranial Nerves: II: Does not blink to threat pupils are equal, round, and reactive to light.   III,IV, VI: Eyes are disconjugate, does not fixate or track, no gaze deviation V: VII: Corneals intact Motor: He withdraws from noxious stimuli in all 4 extremities Sensory: As above Deep Tendon Reflexes: 2+ and symmetric in the biceps and patellae.  Plantars: Toes are downgoing bilaterally.  Cerebellar: Does not perform  I have reviewed labs in epic and the results pertinent to this consultation are: Creatinine 2.69  I have reviewed the images obtained: CT head-no acute intracranial abnormality  Impression: 36 year old male with new onset seizures in the setting of hyperglycemia, cocaine use, severe hypertension and renal failure.  I think posterior reversible encephalopathy syndrome is high on the differential given the renal failure and severe hypertension.  His hyperglycemia coupled with his cocaine use alone would be enough to cause an induced seizure.  Given that he is still obtunded, I think it is reasonable to continue Keppra at this time, but he will not need  long term anti-epileptic therapy unless he has abnormalities on MRI or EEG suggestive of this need.  Recommendations: 1) MRI brain 2) EEG 3) neurology will continue to follow   Ritta SlotMcNeill Lynda Wanninger, MD [facility] [phone]  If 7pm- 7am, please page neurology on call as listed in AMION.

## 2017-03-22 NOTE — ED Notes (Signed)
Stopped Cardene per Leitha BleakKatalina NP

## 2017-03-22 NOTE — Progress Notes (Addendum)
CRITICAL CARE PROGRESS NOTE   ICU day #     2 Hospital day # 2 Mechanical Ventilation day #  2    Patient Summary:     Name: Micheal Levy MRN:   [id number] DOB:   12/21/1980           ADMISSION DATE:  03/21/2017 CONSULTATION DATE:  03/21/2017 REFERRING MD:  Dr. Winfred Leeds  CHIEF COMPLAINT:  Seizure  HISTORY OF PRESENT ILLNESS:   36 year old male with PMH HTN Presented  to ED after 2 witnessed seizures. EMS gave 2.5 Versed. Upon arrival BP 239/134, patient altered and combative requiring intubation. CT Head negative. Given Keppra 1gm.  Glucose 773. WBC 9.3. +Cocaine.  PAST MEDICAL HISTORY : Hypertension    New Events  Agitated when sedation lightened Stable on vent    Current Facility-Administered Medications:  .  acetaminophen (TYLENOL) solution 650 mg, 650 mg, Per Tube, Q6H PRN, 650 mg at 03/22/17 0509 **OR** acetaminophen (TYLENOL) tablet 650 mg, 650 mg, Oral, Q6H PRN **OR** [DISCONTINUED] acetaminophen (TYLENOL) suppository 650 mg, 650 mg, Rectal, Q6H PRN, Thomas, Tijo, DO .  aspirin chewable tablet 81 mg, 81 mg, Per Tube, Daily, [name], NP, 81 mg at 03/22/17 0935 .  chlorhexidine gluconate (MEDLINE KIT) (PERIDEX) 0.12 % solution 15 mL, 15 mL, Mouth Rinse, BID, Thomas, Tijo, DO, 15 mL at 03/22/17 0750 .  fentaNYL 2518mg in NS 2557m(1054mml) infusion-PREMIX, 0-400 mcg/hr, Intravenous, Continuous, Thomas, Tijo, DO, Last Rate: 5 mL/hr at 03/22/17 0940, 50 mcg/hr at 03/22/17 0940 .  fondaparinux (ARIXTRA) injection 2.5 mg, 2.5 mg, Subcutaneous, Daily, EubHayden Pedro NP, 2.5 mg at 03/22/17 1033 .  [START ON 03/23/2017] Influenza vac split quadrivalent PF (FLUARIX) injection 0.5 mL, 0.5 mL, Intramuscular, Tomorrow-1000, Thomas, Tijo, DO .  insulin aspart (novoLOG) injection 0-15 Units, 0-15 Units, Subcutaneous, Q4H, Sivakumar, Siva P, MD, 11 Units at 03/22/17 1208 .  insulin detemir (LEVEMIR) injection 7 Units, 7 Units, Subcutaneous, BID, Thomas, Tijo,  DO, 7 Units at 03/22/17 0435 .  lactated ringers infusion, , Intravenous, Continuous, Thomas, Tijo, DO, Last Rate: 100 mL/hr at 03/22/17 0700 .  levETIRAcetam (KEPPRA) 500 mg in sodium chloride 0.9 % 100 mL IVPB, 500 mg, Intravenous, Q12H, EubOmar PersonP, Stopped at 03/22/17 [id number] [id number] MEDLINE mouth rinse, 15 mL, Mouth Rinse, 10 times per day, Thomas, Tijo, DO, 15 mL at 03/22/17 1209 .  nicardipine (CARDENE) '20mg'$  in 0.86% saline 200m73m infusion (0.1 mg/ml), 3-15 mg/hr, Intravenous, Continuous, Eubanks, KataDanie Chandler, Stopped at 03/22/17 0051 .  piperacillin-tazobactam (ZOSYN) IVPB 3.375 g, 3.375 g, Intravenous, Q8H, WeigJanett BillowH, Stopped at 03/22/17 1045 .  [START ON 03/23/2017] pneumococcal 23 valent vaccine (PNU-IMMUNE) injection 0.5 mL, 0.5 mL, Intramuscular, Tomorrow-1000, Thomas, Tijo, DO .  propofol (DIPRIVAN) 1000 MG/100ML infusion, 0-50 mcg/kg/min, Intravenous, Continuous, Eubanks, Katalina M, NP, Last Rate: 10.2 mL/hr at 03/22/17 0940, 15 mcg/kg/min at 03/22/17 0940 .  [START ON 03/23/2017] vancomycin (VANCOCIN) 1,250 mg in sodium chloride 0.9 % 250 mL IVPB, 1,250 mg, Intravenous, Q24H, Mancheril, BenjDarnell LevelH   PHYSICAL EXAM:   Blood pressure 125/79, pulse 74, temperature 98.7 F (37.1 C), temperature source Axillary, resp. rate (!) 26, height '5\' 7"'$  (1.702 m), weight 226 lb 13.7 oz (102.9 kg), SpO2 94 %.  Eyes: Pupils equal,No pallor Neck:no venous distension CVS:  -s1 s2 regular, no murmur Resp:Breath sounds equal; no rales Abdomen:abd-soft, non distended    BS+ Extremities:No edema Neuro-sedated Skin: no rash  Ventilator PEEP 12 ,   rate 24, Fi02-40%, fi02 40%  Pip- 20s   No AutoPEEP   LABS:   Sputum 12/16 ABUNDANT WBC PRESENT, PREDOMINANTLY PMN  FEW GRAM POSITIVE COCCI   Results for Micheal, Levy (MRN [id number]) as of 03/22/2017 15:10  Ref. Range 03/21/2017 22:05 03/21/2017 22:13  Alcohol, Ethyl (B) Latest Ref Range: <10 mg/dL  <10   Amphetamines Latest Ref Range: NONE DETECTED  NONE DETECTED   Barbiturates Latest Ref Range: NONE DETECTED  NONE DETECTED   Benzodiazepines Latest Ref Range: NONE DETECTED  POSITIVE (A)   Opiates Latest Ref Range: NONE DETECTED  NONE DETECTED   COCAINE Latest Ref Range: NONE DETECTED  POSITIVE (A)   Tetrahydrocannabinol Latest Ref Range: NONE DETECTED  NONE DETECTED     Results for Micheal, Levy (MRN [id number]) as of 03/22/2017 15:10  Ref. Range 03/21/2017 22:05 03/21/2017 23:47  Appearance Latest Ref Range: CLEAR  CLEAR   Bilirubin Urine Latest Ref Range: NEGATIVE  NEGATIVE   Color, Urine Latest Ref Range: YELLOW  STRAW (A)   Glucose Latest Ref Range: NEGATIVE mg/dL >=500 (A)   Hgb urine dipstick Latest Ref Range: NEGATIVE  SMALL (A)   Ketones, ur Latest Ref Range: NEGATIVE mg/dL NEGATIVE   Leukocytes, UA Latest Ref Range: NEGATIVE  NEGATIVE   Nitrite Latest Ref Range: NEGATIVE  NEGATIVE   pH Latest Ref Range: 5.0 - 8.0  7.0   Protein Latest Ref Range: NEGATIVE mg/dL >=300 (A)   Specific Gravity, Urine Latest Ref Range: 1.005 - 1.030  1.026   Bacteria, UA Latest Ref Range: NONE SEEN  NONE SEEN   RBC / HPF Latest Ref Range: 0 - 5 RBC/hpf 0-5   Squamous Epithelial / LPF Latest Ref Range: NONE SEEN  0-5 (A)   WBC, UA Latest Ref Range: 0 - 5 WBC/hpf 0-5   Osmolality, Urine Latest Ref Range: 300 - 900 mOsm/kg  480  Sodium, Urine Latest Units: mmol/L  18    Results for Micheal, Levy (MRN [id number]) as of 03/22/2017 15:10  Ref. Range 03/22/2017 05:45  pH, Arterial Latest Ref Range: 7.350 - 7.450  7.407  pCO2 arterial Latest Ref Range: 32.0 - 48.0 mmHg 41.0  pO2, Arterial Latest Ref Range: 83.0 - 108.0 mmHg 81.7 (L)  Acid-Base Excess Latest Ref Range: 0.0 - 2.0 mmol/L 1.1  Bicarbonate Latest Ref Range: 20.0 - 28.0 mmol/L 25.3    Results for Micheal, Levy (MRN [id number]) as of 03/22/2017 15:10  Ref. Range 03/22/2017 03:57 03/22/2017 05:45 03/22/2017 08:01 03/22/2017 14:01  Sodium  Latest Ref Range: 135 - 145 mmol/L 139  136 135  Potassium Latest Ref Range: 3.5 - 5.1 mmol/L 3.4 (L)  4.1 4.3  Chloride Latest Ref Range: 101 - 111 mmol/L 105  105 104  CO2 Latest Ref Range: 22 - 32 mmol/L 23  21 (L) 22  Glucose Latest Ref Range: 65 - 99 mg/dL 190 (H)  214 (H) 294 (H)  BUN Latest Ref Range: 6 - 20 mg/dL '11  13 17  '$ Creatinine Latest Ref Range: 0.61 - 1.24 mg/dL 2.42 (H)  2.69 (H) 2.86 (H)  Calcium Latest Ref Range: 8.9 - 10.3 mg/dL 8.4 (L)  8.0 (L) 7.9 (L)  Anion gap Latest Ref Range: 5 - '15  11  10 9  '$ Phosphorus Latest Ref Range: 2.5 - 4.6 mg/dL <1.0 (LL)     Magnesium Latest Ref Range: 1.7 - 2.4 mg/dL 1.9     GFR, Est Non African American Latest Ref  Range: >60 mL/min 32 (L)  28 (L) 26 (L)  GFR, Est African American Latest Ref Range: >60 mL/min 37 (L)  33 (L) 31 (L)  B Natriuretic Peptide Latest Ref Range: 0.0 - 100.0 pg/mL 331.4 (H)     Troponin I Latest Ref Range: <0.03 ng/mL 0.36 (HH)  0.36 (HH)   Total CHOL/HDL Ratio Latest Units: RATIO   3.6   Cholesterol Latest Ref Range: 0 - 200 mg/dL   83   HDL Cholesterol Latest Ref Range: >40 mg/dL   23 (L)   LDL (calc) Latest Ref Range: 0 - 99 mg/dL   25   Triglycerides Latest Ref Range: <150 mg/dL   174 (H)   VLDL Latest Ref Range: 0 - 40 mg/dL   35   Lactic Acid, Venous Latest Ref Range: 0.5 - 1.9 mmol/L 3.8 (HH)     Procalcitonin Latest Units: ng/mL 4.55     WBC Latest Ref Range: 4.0 - 10.5 K/uL 13.4 (H)     RBC Latest Ref Range: 4.22 - 5.81 MIL/uL 5.44     Hemoglobin Latest Ref Range: 13.0 - 17.0 g/dL 13.7     HCT Latest Ref Range: 39.0 - 52.0 % 40.7     MCV Latest Ref Range: 78.0 - 100.0 fL 74.8 (L)     MCH Latest Ref Range: 26.0 - 34.0 pg 25.2 (L)     MCHC Latest Ref Range: 30.0 - 36.0 g/dL 33.7     RDW Latest Ref Range: 11.5 - 15.5 % 15.0     Platelets Latest Ref Range: 150 - 400 K/uL 242     TSH Latest Ref Range: 0.350 - 4.500 uIU/mL   0.812        12/16 Renal sono Right Kidney: Length: 12.6 cm.  Echogenicity within normal limits. No mass or hydronephrosis visualized. Left Kidney: Length: 11.7 cm. Echogenicity within normal limits. No mass or hydronephrosis visualized. Bladder:decompressed by Foley catheter. IMPRESSION: No acute abnormality noted.   cxr 12/15 The patient's endotracheal tube is seen ending 2-3 cm above the carina. An enteric tube is noted extending below the diaphragm. A small right pleural effusion is noted. Hazy bilateral airspace opacities, right greater than left, raise concern for pulmonary edema. No pneumothorax is seen. The cardiomediastinal silhouette is borderline normal in size. No acute osseous abnormalities are identified. IMPRESSION: 1. Endotracheal tube seen ending 2-3 cm above the carina. 2. Small right pleural effusion. Hazy bilateral airspace opacities, right greater than left, raise concern for pulmonary edema.   12/15 ct chest Cardiovascular: The heart is mildly enlarged. Minimal coronary artery calcification is seen. The great vessels are grossly unremarkable. The thoracic aorta is unremarkable in appearance. Mediastinum/Nodes: No definite mediastinal lymphadenopathy is seen. No pericardial effusion is identified. The visualized portions of the thyroid gland are unremarkable. No axillary lymphadenopathy is seen. The patient's endotracheal tube is seen ending 1-2 cm above the carina. Lungs/Pleura: Dense bilateral lower lobe airspace opacification is noted, with additional hazy opacity throughout both lungs. This is suspicious for bilateral pneumonia, though superimposed pulmonary edema is a concern. Aspiration pneumonia cannot be excluded. No pleural effusion or pneumothorax is seen. Upper Abdomen: The visualized portions of the liver and spleen are unremarkable. The visualized portions of the gallbladder, pancreas, adrenal glands and kidneys are within normal limits. The patient's enteric tube extends to the body of the  stomach. Musculoskeletal: No acute osseous abnormalities are identified. The visualized musculature is unremarkable in appearance. IMPRESSION: 1. Dense bilateral lower lobe airspace opacification,  with additional hazy opacity throughout both lungs. This is suspicious for bilateral pneumonia, though superimposed pulmonary edema is a concern. Aspiration pneumonia cannot be excluded, given the patient's presentation. 2. Mild cardiomegaly.     Ekg-12/16    sinus-,LVH, Biatrial enlargement, Repolarization abnormality  12/16 echo   Left ventricle: The cavity size was normal. Wall thickness was   increased in a pattern of severe LVH. Systolic function was   normal. The estimated ejection fraction was in the range of 50%   to 55%. Wall motion was normal; there were no regional wall   motion abnormalities. Features are consistent with a pseudonormal   left ventricular filling pattern, with concomitant abnormal   relaxation and increased filling pressure (grade 2 diastolic   dysfunction). - Mitral valve: There was trivial regurgitation. - Right atrium: Central venous pressure (est): 15 mm Hg. - Tricuspid valve: There was mild regurgitation. - Pulmonary arteries: PA peak pressure: 34 mm Hg (S). 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 Trivial mitral   regurgitation. Mild tricuspid regurgitation with PASP estimated   34 mmHg.  12/16 MR brain 1. Scattered punctate acute white matter infarcts in both cerebral hemispheres which may reflect watershed ischemia. 2. 2.5 cm right middle cranial fossa arachnoid cyst. 3. 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 These are typically incidental though rarely can be a cause of seizures.    Assessment- Plan:  Acute hypoxic respiratory failure secondary to   encephalopathy/ seizures/AspirationPneumonia  HTN Emergency /   H/O HTN    acute white matter infarcts in both cerebral hemispheres which may reflect watershed ischemia.  Anion Gap Metabolic Acidosis secondary to lactic acidosis  Acute renal failure secondary to pre-renal vs renal (hypertensive nephrosclerosis)  Hyperglycemia in setting of HONK Encephalopathy in setting of PRES vs Polysubstance abuse vs Hyperglycemia  Seizures in setting of hyperglycemia vs Cocaine use  Polysubstance Abuse      Neurology On Kepra F/u EEG Neurology consult  Cardiology  Cardiology f/u noted optimize BP   Pulmonary  Lung protective ventilation On high Peep,  38m/kg VT At risk of ARDS Keep on dry side F/u cxr, ABG  GI /nutrition Tube feeds in am if remains intubated    Renal, fluids, electrolytes Monitor renal fxn optimize lytes   Hematology Monitor blood counts   Endocrinology Insulin drip if sugars >200s  Infectious diseases On vanco+ zosyn F/u c/s   Lines -Devices 12/15 ETT 12/16 Gastric tube, foley     Counseling Patient/Family:  Updated fiance Prognosis guarded        Thank you for letting me participate in the care of your patient.  ^^^^^^^^^^ I  Have personally spent    70  Minutes  In the care of this Patient providing Critical care Services; Time includes review of chart, labs, imaging, coordinating care with other physicians and healthcare team members. Also includes time for frequent reevaluation and additional treatment implementation due to change in clinical condiiton of patient. Excludes time spent for Procedure and Teaching.   ^^^^^^^^^^  Note subject to typographical and grammatical errors;   Any formal questions or concerns about the content, text, or information contained within the body of this dictation should be directly addressed to the physician  for  clarification.   SEvans Lance MD Pulmonary and CClark MillsPager: [phone]

## 2017-03-22 NOTE — Progress Notes (Signed)
  Echocardiogram 2D Echocardiogram has been performed.  Ethie Curless T Jamaal Bernasconi 03/22/2017, 2:09 PM

## 2017-03-22 NOTE — Progress Notes (Signed)
Sedation held about half an hour , pt woke up, very agitated, grabbing for tubes bilaterally, opened eyes, required 4 staff members to prevent him from harm. Fent and propofol restarted.

## 2017-03-23 ENCOUNTER — Inpatient Hospital Stay (HOSPITAL_COMMUNITY): Payer: Self-pay

## 2017-03-23 DIAGNOSIS — M7989 Other specified soft tissue disorders: Secondary | ICD-10-CM

## 2017-03-23 DIAGNOSIS — I6340 Cerebral infarction due to embolism of unspecified cerebral artery: Secondary | ICD-10-CM

## 2017-03-23 LAB — PROCALCITONIN: Procalcitonin: 4.54 ng/mL

## 2017-03-23 LAB — CBC
HEMATOCRIT: 35.8 % — AB (ref 39.0–52.0)
HEMOGLOBIN: 11.6 g/dL — AB (ref 13.0–17.0)
MCH: 24.7 pg — AB (ref 26.0–34.0)
MCHC: 32.4 g/dL (ref 30.0–36.0)
MCV: 76.2 fL — AB (ref 78.0–100.0)
Platelets: 176 10*3/uL (ref 150–400)
RBC: 4.7 MIL/uL (ref 4.22–5.81)
RDW: 15.6 % — ABNORMAL HIGH (ref 11.5–15.5)
WBC: 10.6 10*3/uL — ABNORMAL HIGH (ref 4.0–10.5)

## 2017-03-23 LAB — BLOOD GAS, ARTERIAL
ACID-BASE EXCESS: 0.4 mmol/L (ref 0.0–2.0)
BICARBONATE: 24.7 mmol/L (ref 20.0–28.0)
DRAWN BY: 252031
FIO2: 40
LHR: 24 {breaths}/min
MECHVT: 400 mL
O2 Saturation: 99.2 %
PATIENT TEMPERATURE: 98.6
PEEP/CPAP: 8 cmH2O
PO2 ART: 148 mmHg — AB (ref 83.0–108.0)
pCO2 arterial: 41.3 mmHg (ref 32.0–48.0)
pH, Arterial: 7.394 (ref 7.350–7.450)

## 2017-03-23 LAB — GLUCOSE, CAPILLARY
GLUCOSE-CAPILLARY: 130 mg/dL — AB (ref 65–99)
GLUCOSE-CAPILLARY: 143 mg/dL — AB (ref 65–99)
Glucose-Capillary: 108 mg/dL — ABNORMAL HIGH (ref 65–99)
Glucose-Capillary: 112 mg/dL — ABNORMAL HIGH (ref 65–99)
Glucose-Capillary: 119 mg/dL — ABNORMAL HIGH (ref 65–99)
Glucose-Capillary: 131 mg/dL — ABNORMAL HIGH (ref 65–99)
Glucose-Capillary: 132 mg/dL — ABNORMAL HIGH (ref 65–99)
Glucose-Capillary: 141 mg/dL — ABNORMAL HIGH (ref 65–99)
Glucose-Capillary: 152 mg/dL — ABNORMAL HIGH (ref 65–99)

## 2017-03-23 LAB — BASIC METABOLIC PANEL
ANION GAP: 8 (ref 5–15)
ANION GAP: 8 (ref 5–15)
BUN: 19 mg/dL (ref 6–20)
BUN: 22 mg/dL — ABNORMAL HIGH (ref 6–20)
CALCIUM: 8.3 mg/dL — AB (ref 8.9–10.3)
CALCIUM: 8.1 mg/dL — AB (ref 8.9–10.3)
CHLORIDE: 105 mmol/L (ref 101–111)
CHLORIDE: 107 mmol/L (ref 101–111)
CO2: 24 mmol/L (ref 22–32)
CO2: 24 mmol/L (ref 22–32)
CREATININE: 2.5 mg/dL — AB (ref 0.61–1.24)
Creatinine, Ser: 2.6 mg/dL — ABNORMAL HIGH (ref 0.61–1.24)
GFR calc non Af Amer: 30 mL/min — ABNORMAL LOW (ref >60)
GFR calc non Af Amer: 31 mL/min — ABNORMAL LOW (ref >60)
GFR, EST AFRICAN AMERICAN: 34 mL/min — AB (ref >60)
GFR, EST AFRICAN AMERICAN: 36 mL/min — AB (ref >60)
GLUCOSE: 121 mg/dL — AB (ref 65–99)
Glucose, Bld: 145 mg/dL — ABNORMAL HIGH (ref 65–99)
POTASSIUM: 3.6 mmol/L (ref 3.5–5.1)
Potassium: 4 mmol/L (ref 3.5–5.1)
SODIUM: 139 mmol/L (ref 135–145)
Sodium: 137 mmol/L (ref 135–145)

## 2017-03-23 LAB — MAGNESIUM: Magnesium: 2.4 mg/dL (ref 1.7–2.4)

## 2017-03-23 LAB — PHOSPHORUS: Phosphorus: 3.9 mg/dL (ref 2.5–4.6)

## 2017-03-23 MED ORDER — PANTOPRAZOLE SODIUM 40 MG IV SOLR
40.0000 mg | INTRAVENOUS | Status: DC
  Administered 2017-03-23: 40 mg via INTRAVENOUS
  Filled 2017-03-23: qty 40

## 2017-03-23 MED ORDER — POTASSIUM CHLORIDE 10 MEQ/100ML IV SOLN
10.0000 meq | Freq: Once | INTRAVENOUS | Status: AC
  Administered 2017-03-23: 10 meq via INTRAVENOUS
  Filled 2017-03-23: qty 100

## 2017-03-23 MED ORDER — ALBUTEROL SULFATE (2.5 MG/3ML) 0.083% IN NEBU: 2.5000 mg | INHALATION_SOLUTION | RESPIRATORY_TRACT | Status: DC | PRN

## 2017-03-23 MED ORDER — INSULIN ASPART 100 UNIT/ML ~~LOC~~ SOLN
0.0000 [IU] | SUBCUTANEOUS | Status: DC
  Administered 2017-03-23 (×3): 1 [IU] via SUBCUTANEOUS
  Administered 2017-03-24: 2 [IU] via SUBCUTANEOUS
  Administered 2017-03-24: 1 [IU] via SUBCUTANEOUS
  Administered 2017-03-24: 3 [IU] via SUBCUTANEOUS
  Administered 2017-03-24 – 2017-03-25 (×3): 2 [IU] via SUBCUTANEOUS
  Administered 2017-03-25: 1 [IU] via SUBCUTANEOUS
  Administered 2017-03-25: 2 [IU] via SUBCUTANEOUS
  Administered 2017-03-25: 3 [IU] via SUBCUTANEOUS
  Administered 2017-03-25 (×2): 2 [IU] via SUBCUTANEOUS
  Administered 2017-03-25 – 2017-03-26 (×2): 1 [IU] via SUBCUTANEOUS
  Administered 2017-03-26 (×2): 2 [IU] via SUBCUTANEOUS

## 2017-03-23 MED ORDER — SODIUM CHLORIDE 0.9 % IV BOLUS (SEPSIS)
1000.0000 mL | Freq: Once | INTRAVENOUS | Status: AC
  Administered 2017-03-23: 1000 mL via INTRAVENOUS

## 2017-03-23 NOTE — Progress Notes (Signed)
VASCULAR LAB PRELIMINARY  PRELIMINARY  PRELIMINARY  PRELIMINARY  Bilateral lower extremity venous duplex completed.    Preliminary report:  There is no DVT or SVT noted in the bilateral lower extremities.   [place] Festa, RVT 03/23/2017, 9:04 AM

## 2017-03-23 NOTE — Progress Notes (Signed)
NeurosurgeonAdministrative Coordinator and Security notified of dispute in patient room between [name] and Micheal Levy. Per Administrative Coordinator, Micheal Levy may remain at patients bedside and Micheal Levy is not allowed to visit per Micheal Levy, whom is the patient's wife. This will remain until the patient is able to make these decisions on his own. RN will continue to monitor.

## 2017-03-23 NOTE — Progress Notes (Signed)
CRITICAL CARE PROGRESS NOTE   ICU day #     2 Hospital day # 2 Mechanical Ventilation day #  2    Patient Summary:     Name:Micheal Levy VOJ:[id number] DOB:1980-11-08  ADMISSION DATE:03/21/2017 CONSULTATION DATE:03/21/2017 REFERRING MD:Dr. Winfred Leeds CHIEF COMPLAINT:Seizure HISTORY OF PRESENT ILLNESS: 36 year old male with PMH HTN Presented  to ED after 2 witnessed seizures. EMS gave 2.5 Versed. Upon arrival BP 239/134, patient altered and combative requiring intubation. CT Head negative. Given Keppra 1gm.  Glucose 773. WBC 9.3. +Cocaine.  PAST MEDICAL HISTORY :Hypertension    New Events  Agitated when sedation was  lightened Stable on vent -  scanty ET secretions No fever/seizure  Scheduled Meds: . aspirin  81 mg Per Tube Daily  . chlorhexidine gluconate (MEDLINE KIT)  15 mL Mouth Rinse BID  . fondaparinux (ARIXTRA) injection  2.5 mg Subcutaneous Daily  . insulin aspart  0-9 Units Subcutaneous Q4H  . mouth rinse  15 mL Mouth Rinse 10 times per day   Continuous Infusions: . fentaNYL infusion INTRAVENOUS Stopped (03/23/17 0908)  . lactated ringers 100 mL/hr at 03/23/17 0700  . levETIRAcetam Stopped (03/23/17 1005)  . niCARDipine Stopped (03/22/17 0051)  . piperacillin-tazobactam (ZOSYN)  IV Stopped (03/23/17 1015)  . propofol (DIPRIVAN) infusion Stopped (03/23/17 0908)  . vancomycin 1,250 mg (03/23/17 1017)   PRN Meds:.acetaminophen (TYLENOL) oral liquid 160 mg/5 mL **OR** acetaminophen **OR** [DISCONTINUED] acetaminophen    Vitals:   03/23/17 0700 03/23/17 0819  BP: 103/73 113/76  Pulse: 68 70  Resp: (!) 24 (!) 24  Temp:  99.2 F (37.3 C)  SpO2: 99% 99%     PHYSICAL EXAM:   Blood pressure 125/79, pulse 74, temperature 98.7 F (37.1 C), temperature source Axillary, resp. rate (!) 26, height _0  (1.702 m), weight 226 lb 13.7 oz (102.9 kg), SpO2 94 %.  Eyes: Pupils equal,No pallor Neck:no venous distension CVS:   -s1 s2 regular, no murmur Resp:Breath sounds equal; no rales Abdomen:abd-soft, non distended    BS+ Extremities:No edema Neuro-sedated Skin: no rash    Ventilator PEEP 12 ,   rate 24, Fi02-40%, fi02 40%  Pip- 20s   No AutoPEEP     Results for KHIAN, REMO (MRN [id number]) as of 03/23/2017 10:54  Ref. Range 03/22/2017 18:21 03/23/2017 03:37  Sodium Latest Ref Range: 135 - 145 mmol/L 136 137  Potassium Latest Ref Range: 3.5 - 5.1 mmol/L 4.1 3.6  Chloride Latest Ref Range: 101 - 111 mmol/L 104 105  CO2 Latest Ref Range: 22 - 32 mmol/L 24 24  Glucose Latest Ref Range: 65 - 99 mg/dL 172 (H) 121 (H)  BUN Latest Ref Range: 6 - 20 mg/dL 20 19  Creatinine Latest Ref Range: 0.61 - 1.24 mg/dL 2.70 (H) 2.60 (H)  Calcium Latest Ref Range: 8.9 - 10.3 mg/dL 8.0 (L) 8.3 (L)  Anion gap Latest Ref Range: 5 - _1 Phosphorus Latest Ref Range: 2.5 - 4.6 mg/dL 4.4 3.9  Magnesium Latest Ref Range: 1.7 - 2.4 mg/dL 2.4 2.4  GFR, Est Non African American Latest Ref Range: >60 mL/min 28 (L) 30 (L)  GFR, Est African American Latest Ref Range: >60 mL/min 33 (L) 34 (L)  CK Total Latest Ref Range: 49 - 397 U/L 445 (H)   Results for ORLYN, ODONOGHUE (MRN [id number]) as of 03/23/2017 10:54  Ref. Range 03/23/2017 03:37  Procalcitonin Latest Units: ng/mL 4.54  WBC Latest Ref Range: 4.0 - 10.5 K/uL 10.6 (H)  RBC Latest Ref Range: 4.22 - 5.81 MIL/uL 4.70  Hemoglobin Latest Ref Range: 13.0 - 17.0 g/dL 11.6 (L)  HCT Latest Ref Range: 39.0 - 52.0 % 35.8 (L)  MCV Latest Ref Range: 78.0 - 100.0 fL 76.2 (L)  MCH Latest Ref Range: 26.0 - 34.0 pg 24.7 (L)  MCHC Latest Ref Range: 30.0 - 36.0 g/dL 32.4  RDW Latest Ref Range: 11.5 - 15.5 % 15.6 (H)  Platelets Latest Ref Range: 150 - 400 K/uL 176  Glucose Latest Ref Range: 65 - 99 mg/dL 121 (H)   Results for ADRIK, KHIM (MRN [id number]) as of 03/23/2017 10:54  Ref. Range 03/22/2017 08:01  TSH Latest Ref Range: 0.350 - 4.500 uIU/mL 0.812   Results for BASEM, YANNUZZI (MRN [id number]) as of 03/23/2017 10:54  Ref. Range 03/23/2017 03:42  pH, Arterial Latest Ref Range: 7.350 - 7.450  7.394  pCO2 arterial Latest Ref Range: 32.0 - 48.0 mmHg 41.3  pO2, Arterial Latest Ref Range: 83.0 - 108.0 mmHg 148 (H)  Acid-Base Excess Latest Ref Range: 0.0 - 2.0 mmol/L 0.4  Bicarbonate Latest Ref Range: 20.0 - 28.0 mmol/L 24.7      12/17 cxr  Endotracheal tube in place with tip position 2.1 cm above the carina. Enteric tube courses in the the abdomen. Mild cardiomegaly. Mediastinal silhouette normal. Lungs hypoinflated. Persistent bibasilar opacities, which may reflect pneumonia or possibly aspiration pneumonitis. Air bronchograms at the left lung base. No pulmonary edema. No appreciable pleural effusion. No pneumothorax. Osseous structures unchanged.   12/16 Renal sono Right Kidney: Length: 12.6 cm. Echogenicity within normal limits. No mass or hydronephrosis visualized. Left Kidney: Length: 11.7 cm. Echogenicity within normal limits. No mass or hydronephrosis visualized. Bladder:decompressed by Foley catheter. IMPRESSION: No acute abnormality noted.     12/15 ct chest Cardiovascular: The heart is mildly enlarged. Minimal coronary artery calcification is seen. The great vessels are grossly unremarkable. The thoracic aorta is unremarkable in appearance. Mediastinum/Nodes: No definite mediastinal lymphadenopathy is seen. No pericardial effusion is identified. The visualized portions of the thyroid gland are unremarkable. No axillary lymphadenopathy is seen. The patient's endotracheal tube is seen ending 1-2 cm above the carina. Lungs/Pleura: Dense bilateral lower lobe airspace opacification is noted, with additional hazy opacity throughout both lungs. This is suspicious for bilateral pneumonia, though superimposed pulmonary edema is a concern. Aspiration pneumonia cannot be excluded. No pleural effusion or pneumothorax is  seen. Upper Abdomen: The visualized portions of the liver and spleen are unremarkable. The visualized portions of the gallbladder, pancreas, adrenal glands and kidneys are within normal limits. The patient's enteric tube extends to the body of the stomach. Musculoskeletal: No acute osseous abnormalities are identified. The visualized musculature is unremarkable in appearance. IMPRESSION: 1. Dense bilateral lower lobe airspace opacification, with additional hazy opacity throughout both lungs. This is suspicious for bilateral pneumonia, though superimposed pulmonary edema is a concern. Aspiration pneumonia cannot be excluded, given the patient's presentation. 2. Mild cardiomegaly.     Ekg-12/16    sinus-,LVH, Biatrial enlargement, Repolarization abnormality  12/16 echo   Left ventricle: The cavity size was normal. Wall thickness was increased in a pattern of severe LVH. Systolic function was normal. The estimated ejection fraction was in the range of 50% to 55%. Wall motion was normal; there were no regional wall motion abnormalities. Features are consistent with a pseudonormal left ventricular filling pattern, with concomitant abnormal relaxation and increased filling pressure (grade 2 diastolic dysfunction). - Mitral valve: There was trivial regurgitation. - Right atrium: Central  venous pressure (est): 15 mm Hg. - Tricuspid valve: There was mild regurgitation. - Pulmonary arteries: PA peak pressure: 34 mm Hg (S). 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 Trivial mitral regurgitation. Mild tricuspid regurgitation with PASP estimated 34 mmHg.  12/16 MR brain 1. Scattered punctate acute white matter infarcts in both cerebral hemispheres which may reflect watershed ischemia. 2. 2.5 cm right middle cranial fossa arachnoid cyst. 3. 12 mm left choroidal fissure cyst with mild mass  effect on the left hippocampus. These are typically incidental though rarely can be a cause of seizures.  12.16.18 EEG Abnormalities: 1) sedated EEG Clinical Interpretation: This EEG is consistent with the patient's sedated state.  There was no seizure or seizure predisposition recorded on this study. Please note that a normal EEG does not preclude the possibility of epilepsy.   Assessment- Plan:  Acute hypoxic respiratory failure secondary to  encephalopathy/ seizures/AspirationPneumonia HTN Emergency /   H/O HTN  acute white matter infarcts in both cerebral hemispheres which may reflect watershed ischemia. Anion Gap Metabolic Acidosissecondary to lactic acidosis  Acute renal failure secondary to pre-renal vs renal (hypertensive nephrosclerosis) Hyperglycemia in setting of HONK Encephalopathy in setting of PRES vs Polysubstance abuse vs Hyperglycemia  Seizures in setting of hyperglycemia vs Cocaine use  Polysubstance Abuse      Neurology On Kepra F/u EEG Neurology consult  Cardiology  Cardiology f/u noted optimize BP   Pulmonary  Lung protective ventilation On high Peep,  71m/kg VT At risk of ARDS Keep on dry side F/u cxr, ABG SBT when mentation stable/calm off sedation    GI /nutrition Tube feeds if remains intubated    Renal, fluids, electrolytes Monitor renal fxn optimize lytes   Hematology Monitor blood counts   Endocrinology Insulin drip if sugars >200s  Infectious diseases On vanco+ zosyn F/u c/s   Lines -Devices 12/15 ETT 12/16 Gastric tube, foley     Counseling Patient/Family:  Updated /counseled wife Prognosis guarded        Thank you for letting me participate in the care of your patient.  ^^^^^^^^^^ I  Have personally spent    60  Minutes  In the care of this Patient providing Critical care Services; Time includes review of chart, labs, imaging, coordinating care with other physicians and  healthcare team members. Also includes time for frequent reevaluation and additional treatment implementation due to change in clinical condiiton of patient. Excludes time spent for Procedure and Teaching.   ^^^^^^^^^^  Note subject to typographical and grammatical errors;   Any formal questions or concerns about the content, text, or information contained within the body of this dictation should be directly addressed to the physician  for  clarification.   SEvans Lance MD Pulmonary and CBirchwood VillagePager: [phone]

## 2017-03-23 NOTE — Progress Notes (Signed)
STROKE TEAM PROGRESS NOTE   SUBJECTIVE (INTERVAL HISTORY) His wife is at the bedside. Pt still intubated on sedation, not tolerating taper down sedation. BP soft at low end, on IVF. DVT negative this am. MRI showed b/l watershed punctate infarcts, concerning for BP related. EEG negative for seizure.    OBJECTIVE Temp:  [97.6 F (36.4 C)-99.2 F (37.3 C)] 99.2 F (37.3 C) (12/17 0819) Pulse Rate:  [64-86] 70 (12/17 0819) Cardiac Rhythm: Normal sinus rhythm (12/16 2000) Resp:  [14-26] 24 (12/17 0819) BP: (103-139)/(66-90) 113/76 (12/17 0819) SpO2:  [93 %-100 %] 99 % (12/17 0819) FiO2 (%):  [40 %] 40 % (12/17 0819)  Recent Labs  Lab 03/23/17 0007 03/23/17 0120 03/23/17 0235 03/23/17 0436 03/23/17 0806  GLUCAP 108* 152* 131* 119* 130*   Recent Labs  Lab 03/22/17 0357 03/22/17 0801 03/22/17 1401 03/22/17 1821 03/23/17 0337  NA 139 136 135 136 137  K 3.4* 4.1 4.3 4.1 3.6  CL 105 105 104 104 105  CO2 23 21* _0 GLUCOSE 190* 214* 294* 172* 121*  BUN _1 CREATININE 2.42* 2.69* 2.86* 2.70* 2.60*  CALCIUM 8.4* 8.0* 7.9* 8.0* 8.3*  MG 1.9  --   --  2.4 2.4  PHOS <1.0*  --   --  4.4 3.9   Recent Labs  Lab 03/21/17 2024  AST 35  ALT 28  ALKPHOS 137*  BILITOT 0.7  PROT 7.7  ALBUMIN 4.2   Recent Labs  Lab 03/21/17 2024 03/21/17 2148 03/22/17 0357 03/23/17 0337  WBC 9.3 25.0* 13.4* 10.6*  NEUTROABS 5.1 23.0*  --   --   HGB 15.5 15.9 13.7 11.6*  HCT 45.7 46.6 40.7 35.8*  MCV 76.0* 75.6* 74.8* 76.2*  PLT 232 233 242 176   Recent Labs  Lab 03/21/17 2230 03/22/17 0357 03/22/17 0801 03/22/17 1821  CKTOTAL  --   --   --  445*  TROPONINI 0.07* 0.36* 0.36*  --    No results for input(s): LABPROT, INR in the last 72 hours. Recent Labs    03/21/17 2205  COLORURINE STRAW*  LABSPEC 1.026  PHURINE 7.0  GLUCOSEU >=500*  HGBUR SMALL*  BILIRUBINUR NEGATIVE  KETONESUR NEGATIVE  PROTEINUR >=300*  NITRITE NEGATIVE  LEUKOCYTESUR NEGATIVE        Component Value Date/Time   CHOL 83 03/22/2017 0801   TRIG 174 (H) 03/22/2017 0801   HDL 23 (L) 03/22/2017 0801   CHOLHDL 3.6 03/22/2017 0801   VLDL 35 03/22/2017 0801   LDLCALC 25 03/22/2017 0801   No results found for: HGBA1C    Component Value Date/Time   LABOPIA NONE DETECTED 03/21/2017 2205   COCAINSCRNUR POSITIVE (A) 03/21/2017 2205   LABBENZ POSITIVE (A) 03/21/2017 2205   AMPHETMU NONE DETECTED 03/21/2017 2205   THCU NONE DETECTED 03/21/2017 2205   LABBARB NONE DETECTED 03/21/2017 2205    Recent Labs  Lab 03/21/17 2213  ETH <10    I have personally reviewed the radiological images below and agree with the radiology interpretations.  Ct Head and cervical Wo Contrast 03/21/2017 IMPRESSION: 1. No acute intracranial abnormality. Incidental note of right middle cranial fossa arachnoid cyst and tiny quadrigeminal lipoma, typically of no clinical significance. 2. No acute fracture or subluxation of the cervical spine. 3. Small vertebral body lucencies within multiple vertebral bodies are nonspecific. This may reflect focal osteopenia or hemangiomas, however metastasis or multiple myeloma could have a similar appearance, which would be unusual given patient's  age. MRI could be considered for further evaluation. 4. Right apical consolidation has a slightly mass like appearance, only partially included in the field of view. Differential considerations include pneumonia, aspiration or less likely neoplasm. This is not well seen on chest radiograph obtained today, consider chest CT characterization.   Ct Chest Wo Contrast 03/22/2017 IMPRESSION: 1. Dense bilateral lower lobe airspace opacification, with additional hazy opacity throughout both lungs. This is suspicious for bilateral pneumonia, though superimposed pulmonary edema is a concern. Aspiration pneumonia cannot be excluded, given the patient's presentation. 2. Mild cardiomegaly.   Mr Brain Wo Contrast 03/22/2017 IMPRESSION: 1.  Scattered punctate acute white matter infarcts in both cerebral hemispheres which may reflect watershed ischemia. 2. 2.5 cm right middle cranial fossa arachnoid cyst. 3. 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 These are typically incidental though rarely can be a cause of seizures.   US Renal 03/22/2017 IMPRESSION: No acute abnormality noted.   EEG Clinical Interpretation: This EEG is consistent with the patient's sedated state. There was no seizure or seizure predisposition recorded on this study. Please note that a normal EEG does not preclude the possibility of epilepsy.   LE venous doppler - neg for DVT  MRA head pending  Carotid Doppler  pending  TTE -  Severe LVH with LVEF 50-55% and grade 2 diastolic dysfunction.   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 Trivial mitral   regurgitation. Mild tricuspid regurgitation with PASP estimated   34 mmHg.   PHYSICAL EXAM  Temp:  [97.6 F (36.4 C)-99.2 F (37.3 C)] 99.2 F (37.3 C) (12/17 0819) Pulse Rate:  [64-86] 70 (12/17 0819) Resp:  [14-26] 24 (12/17 0819) BP: (103-139)/(66-90) 113/76 (12/17 0819) SpO2:  [93 %-100 %] 99 % (12/17 0819) FiO2 (%):  [40 %] 40 % (12/17 0819)  General - Well nourished, well developed, intubated.  Ophthalmologic - fundi not visualized due to noncooperation.  Cardiovascular - Regular rate and rhythm  Neuro - intubated on sedation, not response to voice, not following commands, eyes closed. On forced eye opening, pupils equal, but very sluggish papillary reflexes, no significant doll's eyes, weak corneal b/l, positive gag. On pain stimulation, LUE and LLE localizing to pain and withdraw against gravities. RUE and RLE withdraw but not able to against gravity. DTR 1+ and bilateral babinski. Sensation, coordination and gait not tested.   ASSESSMENT/PLAN Mr. Florence Antonelli is a 36 y.o. male with history of HTN admitted for seizure x 2 and unresponsiveness.  Found to have severe hyperglycemia and CKD with positive cocaine. Intubated on sedation.   Seizure - likely related to hyperglycemia, hypertensive emergency, CKD and cocaine use  EEG no seizure   On keppra 538m bid  No seizure like activity on sedation  Seizure precautions.  Stroke:  bilateral punctate watershed infarct - likely secondary to from BP high 200s to low 100s, however, other causes also in DDx including cardioembolic infarcts, endocarditis, cocaine related arteriopathy    Resultant intubated on sedation  MRI  Bilateral cerebral watershed infarcts  MRA  pending  Carotid Doppler  pending  2D Echo  EF 50-55% but severe LVH  LE venous doppler - neg for DVT  May consider TEE for further work up  LDL 25  HgbA1c pending  Arixtra for VTE prophylaxis  Diet NPO time specified   aspirin 81 mg daily prior to admission, now on aspirin 81 mg daily.   Ongoing aggressive stroke risk factor management  Therapy recommendations:  pending  Disposition:  pending  Diabetes  HgbA1c pending goal < 7.0  Uncontrolled  Hyperglycemia on admission needed insulin drip  Off insulin drip   CBG monitoring  SSI  DM education and close PCP follow up  CKD  Elevated Cre 2.42->2.69->2.70->2.60  Likely chronic given hypertensive emergency and hyperglycemia  Close monitoring  Hypertensive emergency Stable now Rapid BP drop initially after intubation with sedation  Permissive hypertension (OK if <220/120) for 24-48 hours post stroke and then gradually normalized within 5-7 days.  Long term BP goal normotensive  Avoid hypotension  Cocaine use   UDS positive for cocaine  Wife does not know any previous use  Cessation education will be provided later  Fever - one time event  Blood culture pending  Temp 103.2 on admission - one time  Continue to monitor  Leukocytosis - 9.3->25.0->13.4->10.6  May consider TEE to rule out endocarditis or other cardiembolic  source  Tobacco abuse  Current smoker  Smoking cessation counseling will be provided  Other Stroke Risk Factors  Obesity, Body mass index is 35.53 kg/m.   Other Active Problems  Agitation on sedation  Hospital day # 2  This patient is critically ill due to seizure, stroke, hyperglycemia, hypertensive emergency, fever, cocaine use and at significant risk of neurological worsening, death form status epilepticus, recurrent stroke, sepsis, heart failure. This patient's care requires constant monitoring of vital signs, hemodynamics, respiratory and cardiac monitoring, review of multiple databases, neurological assessment, discussion with family, other specialists and medical decision making of high complexity. I had long discussion with wife at bedside, updated pt current condition, treatment plan and potential prognosis. She expressed understanding and appreciation. I also discussed with Dr. Pati Gallo in the hallway. I spent 45 minutes of neurocritical care time in the care of this patient.   Rosalin Hawking, MD PhD Stroke Neurology 03/23/2017 10:40 AM    To contact Stroke Continuity provider, please refer to [email or web address] After hours, contact General Neurology

## 2017-03-23 NOTE — Progress Notes (Signed)
eLink Physician-Brief Progress Note Patient Name: Micheal ChihuahuaShuron Levy DOB: 01-08-1981 MRN: [id number]   Date of Service  03/23/2017  HPI/Events of Note  Multiple issues: 1. Hyperglycemia - Blood glucose = 131 --> 152 --> 108. Insulin IV infusion has been off for 3 hours and 2. Oliguria - bladder scan with 13 mL. Last LVEF = 50% to 55%.   eICU Interventions  Will order: 1. Q 4 hour sensitive Novolog SSI. 2. Bolus with 0.9 NaCl 1 liter IV over 1 hour now.      Intervention Category Major Interventions: Hyperglycemia - active titration of insulin therapy Intermediate Interventions: Oliguria - evaluation and management  Sommer,Steven Eugene 03/23/2017, 3:03 AM

## 2017-03-23 NOTE — Progress Notes (Signed)
eLink Physician-Brief Progress Note Patient Name: Micheal Levy DOB: 1980-04-20 MRN: [id number]   Date of Service  03/23/2017  HPI/Events of Note  Notified of need for stress ulcer prophylaxis. Patient intubated and ventilated.   eICU Interventions  Will order Protonix IV.      Intervention Category Intermediate Interventions: Best-practice therapies (e.g. DVT, beta blocker, etc.)  Sommer,Steven Eugene 03/23/2017, 7:20 PM

## 2017-03-23 NOTE — Progress Notes (Signed)
Initial Nutrition Assessment  DOCUMENTATION CODES:   Obesity unspecified  INTERVENTION:   If unable to extubate and if medically appropriate, recommend initiation of tube feeding Vital High Protein at 20 ml/hr (480 ml daily) with 60 ml Pro-Stat TID  Tube feeding regimen will provide 1080 kcal, 132 grams of protein, and 403 ml of H2O.   Tube feeding regimen and current propofol rate will provide 1439 total kcal (100% of needs)  NUTRITION DIAGNOSIS:   Inadequate oral intake related to inability to eat as evidenced by NPO status.  GOAL:   Patient will meet greater than or equal to 90% of their needs  MONITOR:   Vent status, Weight trends, Labs, I & O's  REASON FOR ASSESSMENT:   Ventilator    ASSESSMENT:   Pt with PMH of HTN presents with hyperglycemia after 2 witnessed seizures/encephalopathy requiring intubation    Patient is currently intubated on ventilator support MV: 9.9 L/min Temp (24hrs), Avg:98.5 F (36.9 C), Min:97.6 F (36.4 C), Max:99.2 F (37.3 C)  Propofol: 13.6 ml/hr, providing 359 calories per day   Pt positive for cocaine on UDS Discussed pt with RN, reports pt may get extubated tomorrow and propofol may be discontinued. Pt's wife at bedside reports pt has gained weight and has a good appetite at baseline.   Labs reviewed; CBG 106-222, BUN 22 Medications reviewed; sliding scale insulin, potassium chloride, propofol  NUTRITION - FOCUSED PHYSICAL EXAM:    Most Recent Value  Orbital Region  No depletion  Upper Arm Region  No depletion  Thoracic and Lumbar Region  No depletion  Buccal Region  No depletion  Temple Region  No depletion  Clavicle Bone Region  No depletion  Clavicle and Acromion Bone Region  No depletion  Scapular Bone Region  No depletion  Dorsal Hand  No depletion  Patellar Region  No depletion  Anterior Thigh Region  No depletion  Posterior Calf Region  No depletion  Edema (RD Assessment)  None      Diet Order:  Diet NPO  time specified  EDUCATION NEEDS:   No education needs have been identified at this time  Skin:  Skin Assessment: Reviewed RN Assessment  Last BM:  Unknown BM date  Height:   Ht Readings from Last 1 Encounters:  03/22/17 5\' 7"  (1.702 m)    Weight:   Wt Readings from Last 1 Encounters:  03/22/17 226 lb 13.7 oz (102.9 kg)    Ideal Body Weight:  67 kg  BMI:  Body mass index is 35.53 kg/m.  Estimated Nutritional Needs:   Kcal:  [id number]  Protein:  134 grams  Fluid:  >/= 2 L/d  Fransisca KaufmannAllison Ioannides, MS, RDN, LDN 03/23/2017 1:04 PM

## 2017-03-24 ENCOUNTER — Inpatient Hospital Stay (HOSPITAL_COMMUNITY): Payer: Self-pay

## 2017-03-24 DIAGNOSIS — I639 Cerebral infarction, unspecified: Secondary | ICD-10-CM

## 2017-03-24 DIAGNOSIS — J9601 Acute respiratory failure with hypoxia: Secondary | ICD-10-CM

## 2017-03-24 LAB — BASIC METABOLIC PANEL
Anion gap: 9 (ref 5–15)
BUN: 23 mg/dL — AB (ref 6–20)
CHLORIDE: 109 mmol/L (ref 101–111)
CO2: 22 mmol/L (ref 22–32)
CREATININE: 2.23 mg/dL — AB (ref 0.61–1.24)
Calcium: 8.3 mg/dL — ABNORMAL LOW (ref 8.9–10.3)
GFR calc Af Amer: 41 mL/min — ABNORMAL LOW (ref >60)
GFR calc non Af Amer: 36 mL/min — ABNORMAL LOW (ref >60)
GLUCOSE: 111 mg/dL — AB (ref 65–99)
Potassium: 4.3 mmol/L (ref 3.5–5.1)
Sodium: 140 mmol/L (ref 135–145)

## 2017-03-24 LAB — HEPATIC FUNCTION PANEL
ALBUMIN: 2.5 g/dL — AB (ref 3.5–5.0)
ALK PHOS: 54 U/L (ref 38–126)
ALT: 14 U/L — AB (ref 17–63)
AST: 25 U/L (ref 15–41)
BILIRUBIN TOTAL: 1.5 mg/dL — AB (ref 0.3–1.2)
Bilirubin, Direct: 0.5 mg/dL (ref 0.1–0.5)
Indirect Bilirubin: 1 mg/dL — ABNORMAL HIGH (ref 0.3–0.9)
Total Protein: 5.5 g/dL — ABNORMAL LOW (ref 6.5–8.1)

## 2017-03-24 LAB — BLOOD GAS, ARTERIAL
Acid-base deficit: 0.8 mmol/L (ref 0.0–2.0)
BICARBONATE: 24.4 mmol/L (ref 20.0–28.0)
Drawn by: 44135
FIO2: 40
O2 SAT: 95.6 %
PEEP: 8 cmH2O
Patient temperature: 98.7
RATE: 24 resp/min
VT: 400 mL
pCO2 arterial: 48.5 mmHg — ABNORMAL HIGH (ref 32.0–48.0)
pH, Arterial: 7.323 — ABNORMAL LOW (ref 7.350–7.450)
pO2, Arterial: 81.7 mmHg — ABNORMAL LOW (ref 83.0–108.0)

## 2017-03-24 LAB — GLUCOSE, CAPILLARY
GLUCOSE-CAPILLARY: 161 mg/dL — AB (ref 65–99)
Glucose-Capillary: 118 mg/dL — ABNORMAL HIGH (ref 65–99)
Glucose-Capillary: 177 mg/dL — ABNORMAL HIGH (ref 65–99)
Glucose-Capillary: 191 mg/dL — ABNORMAL HIGH (ref 65–99)
Glucose-Capillary: 202 mg/dL — ABNORMAL HIGH (ref 65–99)
Glucose-Capillary: 208 mg/dL — ABNORMAL HIGH (ref 65–99)

## 2017-03-24 LAB — CBC
HCT: 38.9 % — ABNORMAL LOW (ref 39.0–52.0)
Hemoglobin: 12.3 g/dL — ABNORMAL LOW (ref 13.0–17.0)
MCH: 25.6 pg — AB (ref 26.0–34.0)
MCHC: 31.6 g/dL (ref 30.0–36.0)
MCV: 80.9 fL (ref 78.0–100.0)
PLATELETS: 173 10*3/uL (ref 150–400)
RBC: 4.81 MIL/uL (ref 4.22–5.81)
RDW: 16.6 % — ABNORMAL HIGH (ref 11.5–15.5)
WBC: 10.4 10*3/uL (ref 4.0–10.5)

## 2017-03-24 LAB — HEMOGLOBIN A1C: HEMOGLOBIN A1C: UNDETERMINED % (ref 4.8–5.6)

## 2017-03-24 MED ORDER — LABETALOL HCL 5 MG/ML IV SOLN
10.0000 mg | Freq: Once | INTRAVENOUS | Status: AC
  Administered 2017-03-24: 10 mg via INTRAVENOUS

## 2017-03-24 MED ORDER — METOPROLOL TARTRATE 25 MG PO TABS
25.0000 mg | ORAL_TABLET | Freq: Three times a day (TID) | ORAL | Status: DC
  Administered 2017-03-24 – 2017-03-26 (×6): 25 mg via ORAL
  Filled 2017-03-24 (×6): qty 1

## 2017-03-24 MED ORDER — AMLODIPINE BESYLATE 10 MG PO TABS
10.0000 mg | ORAL_TABLET | Freq: Every day | ORAL | Status: DC
  Administered 2017-03-24 – 2017-03-26 (×3): 10 mg via ORAL
  Filled 2017-03-24 (×3): qty 1

## 2017-03-24 MED ORDER — LEVETIRACETAM 500 MG PO TABS
500.0000 mg | ORAL_TABLET | Freq: Two times a day (BID) | ORAL | Status: DC
  Administered 2017-03-24 – 2017-03-26 (×4): 500 mg via ORAL
  Filled 2017-03-24 (×4): qty 1

## 2017-03-24 MED ORDER — ALBUTEROL SULFATE (2.5 MG/3ML) 0.083% IN NEBU: 2.5000 mg | INHALATION_SOLUTION | RESPIRATORY_TRACT | Status: DC | PRN

## 2017-03-24 MED ORDER — HYDRALAZINE HCL 25 MG PO TABS
25.0000 mg | ORAL_TABLET | Freq: Three times a day (TID) | ORAL | Status: DC
  Administered 2017-03-24 – 2017-03-25 (×3): 25 mg via ORAL
  Filled 2017-03-24 (×3): qty 1

## 2017-03-24 MED ORDER — LABETALOL HCL 5 MG/ML IV SOLN
10.0000 mg | INTRAVENOUS | Status: DC | PRN
  Administered 2017-03-24 (×2): 40 mg via INTRAVENOUS
  Administered 2017-03-24: 20 mg via INTRAVENOUS
  Administered 2017-03-25: 40 mg via INTRAVENOUS
  Administered 2017-03-25: 20 mg via INTRAVENOUS
  Administered 2017-03-25: 40 mg via INTRAVENOUS
  Administered 2017-03-25: 20 mg via INTRAVENOUS
  Administered 2017-03-26: 30 mg via INTRAVENOUS
  Administered 2017-03-26: 20 mg via INTRAVENOUS
  Filled 2017-03-24 (×2): qty 8
  Filled 2017-03-24: qty 4
  Filled 2017-03-24: qty 8
  Filled 2017-03-24 (×2): qty 4
  Filled 2017-03-24 (×2): qty 8
  Filled 2017-03-24: qty 4

## 2017-03-24 MED ORDER — LABETALOL HCL 5 MG/ML IV SOLN
INTRAVENOUS | Status: AC
  Filled 2017-03-24: qty 4

## 2017-03-24 NOTE — Evaluation (Addendum)
Physical Therapy Evaluation Patient Details Name: Micheal ChihuahuaShuron Collyer MRN: [id number] DOB: [birth year] Today's Date: 03/24/2017   History of Present Illness  Pt is a 36 y.o. male who presented to ED on 03/21/17 after 2 witnessed seizures; pt altered and combative requiring intubation; + for cocaine. CT head negative. 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 EEG negative for seizures. Self-extubated 12/18. Pertinent PMH includes HTN.    Clinical Impression  Pt presents with decreased awareness, decreased stability, and an overall decrease in functional mobility secondary to above. PTA, pt indep with mobility and lives with girlfriend who will be available for 24/7 support. Today, pt able to amb with HHA and close min guard for balance. BP up to 201/132 with standing, down to 187/132 post-ambulation (RN notified). SpO2 remained >90% on 3L O2 [place]. Pending pt progression, may need a RW at d/c for improved stability, in addition to outpatient PT. Pt would benefit from continued acute PT services to maximize functional mobility and independence.     Follow Up Recommendations Outpatient PT(pending progression)    Equipment Recommendations  (TBD)    Recommendations for Other Services OT consult     Precautions / Restrictions Precautions Precautions: Fall Restrictions Weight Bearing Restrictions: No      Mobility  Bed Mobility               General bed mobility comments: Received sitting in chair  Transfers Overall transfer level: Needs assistance Equipment used: None Transfers: Sit to/from Stand Sit to Stand: Min guard         General transfer comment: Min guard for balance; no physical assist required  Ambulation/Gait Ambulation/Gait assistance: Min guard;Min assist Ambulation Distance (Feet): 150 Feet Assistive device: None;1 person hand held assist Gait Pattern/deviations: Step-through  pattern;Decreased stride length;Staggering right;Drifts right/left Gait velocity: Decreased Gait velocity interpretation: <1.8 ft/sec, indicative of risk for recurrent falls General Gait Details: Amb with no DME and close min guard with intermittent staggering (R>L), which pt able to self correct. 1x LOB posteriorly requiring minA and HHA to correct. Pt reports increased lightheadedness with standing and amb  Stairs            Wheelchair Mobility    Modified Rankin (Stroke Patients Only) Pre-Morbid Rankin Score: 0  Modified Rankin: 4       Balance Overall balance assessment: Needs assistance   Sitting balance-Leahy Scale: Good Sitting balance - Comments: Able to don socks indep sitting at edge of chair     Standing balance-Leahy Scale: Fair Standing balance comment: Able to amb with no UE support and close min guard                             Pertinent Vitals/Pain Pain Assessment: No/denies pain Pain Intervention(s): Monitored during session    Home Living Family/patient expects to be discharged to:: Private residence Living Arrangements: Spouse/significant other;Children(Girlfriend) Available Help at Discharge: Family;Available 24 hours/day(Girlfriend) Type of Home: House Home Access: Stairs to enter Entrance Stairs-Rails: Right Entrance Stairs-Number of Steps: 3 Home Layout: One level Home Equipment: None      Prior Function Level of Independence: Independent         Comments: 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, drillinglifting     Hand Dominance        Extremity/Trunk Assessment   Upper Extremity Assessment Upper Extremity Assessment: Overall WFL for tasks assessed  Lower Extremity Assessment Lower Extremity Assessment: Generalized weakness    Cervical / Trunk Assessment Cervical / Trunk Assessment: Normal  Communication   Communication: No difficulties  Cognition Arousal/Alertness: Lethargic Behavior During Therapy: Flat  affect Overall Cognitive Status: Impaired/Different from baseline Area of Impairment: Orientation;Attention;Memory;Following commands;Safety/judgement;Awareness                 Orientation Level: Disoriented to;Place;Time;Situation Current Attention Level: Selective Memory: Decreased short-term memory Following Commands: Follows multi-step commands consistently Safety/Judgement: Decreased awareness of deficits Awareness: Emergent   General Comments: Pt with increased fatigue which could be compounding apparent cognitive deficits. Oriented to himself and his girlfriend, knew he is in a hospital, stating he is here because he had some strokes. Intermittent cues to open eyes and attend to task, but pt becoming more alert throughout session      General Comments General comments (skin integrity, edema, etc.): Pt with delayed, rigid visual tracking (almost saccadic); likely central cause; reports increased dizziness with tracking. Girlfriend present during session    Exercises     Assessment/Plan    PT Assessment Patient needs continued PT services  PT Problem List Decreased strength;Decreased activity tolerance;Decreased balance;Decreased mobility;Decreased coordination;Decreased knowledge of use of DME;Decreased cognition       PT Treatment Interventions DME instruction;Gait training;Stair training;Functional mobility training;Therapeutic activities;Therapeutic exercise;Balance training;Patient/family education    PT Goals (Current goals can be found in the Care Plan section)  Acute Rehab PT Goals Patient Stated Goal: Plan to get back to work PT Goal Formulation: With patient Time For Goal Achievement: 04/07/17 Potential to Achieve Goals: Good    Frequency Min 4X/week   Barriers to discharge        Co-evaluation               AM-PAC PT "6 Clicks" Daily Activity  Outcome Measure Difficulty turning over in bed (including adjusting bedclothes, sheets and  blankets)?: A Little Difficulty moving from lying on back to sitting on the side of the bed? : A Little Difficulty sitting down on and standing up from a chair with arms (e.g., wheelchair, bedside commode, etc,.)?: A Little Help needed moving to and from a bed to chair (including a wheelchair)?: A Little Help needed walking in hospital room?: A Little Help needed climbing 3-5 steps with a railing? : A Little 6 Click Score: 18    End of Session Equipment Utilized During Treatment: Gait belt;Oxygen Activity Tolerance: Patient tolerated treatment well;Patient limited by fatigue Patient left: with family/visitor present;Other (comment);with nursing/sitter in room(in w/c for transport)   PT Visit Diagnosis: Unsteadiness on feet (R26.81)    Time: [id number] PT Time Calculation (min) (ACUTE ONLY): 30 min   Charges:   PT Evaluation $PT Eval Moderate Complexity: 1 Mod PT Treatments $Gait Training: 8-22 mins   PT G Codes:       Ina HomesJaclyn Shekela Goodridge, PT, DPT Acute Rehab Services  Pager: [phone]  Malachy ChamberJaclyn L Sheryle Vice 03/24/2017, 11:44 AM

## 2017-03-24 NOTE — Progress Notes (Addendum)
*  PRELIMINARY RESULTS* Vascular Ultrasound Carotid Duplex (Doppler) has been completed.  Findings suggest 1-39% internal carotid artery stenosis bilaterally. The left vertebral artery is patent with antegrade flow. Unable to visualize the right vertebral artery.  03/24/2017 3:06 PM Gertie FeyMichelle Kerline Trahan, BS, RVT, RDCS, RDMS

## 2017-03-24 NOTE — Progress Notes (Addendum)
CRITICAL CARE PROGRESS NOTE   Patient Summary:       ICU day #3 Hospital day #3  Patient Summary:  Name:Micheal Levy UJW:[id number] DOB:27-Mar-1981   ADMISSION DATE:03/21/2017 CONSULTATION DATE:03/21/2017 REFERRING MD:Dr. Ethelda ChickJacubowitz CHIEF COMPLAINT:Seizure HISTORY OF PRESENT ILLNESS: 36 year old male with PMH HTN Presentedto ED after 2 witnessed seizures. EMS gave 2.5 Versed. Upon arrival BP 239/134, patient altered and combative requiring intubation. CT Head negative. Given Keppra 1gm.Glucose 773. WBC 9.3. +Cocaine.   PAST MEDICAL HISTORY :Hypertension   New Events Self extubated overnite. Denies SOB or pain. Voiding clear urine. Up in chair.  No fever/seizure Knows he is in hospital. Recognizes people.   Scheduled Meds: . aspirin  81 mg Per Tube Daily  . fondaparinux (ARIXTRA) injection  2.5 mg Subcutaneous Daily  . insulin aspart  0-9 Units Subcutaneous Q4H  . mouth rinse  15 mL Mouth Rinse 10 times per day   Continuous Infusions: . lactated ringers Stopped (03/24/17 0705)  . levETIRAcetam Stopped (03/23/17 2045)  . niCARDipine Stopped (03/22/17 0051)  . piperacillin-tazobactam (ZOSYN)  IV 3.375 g (03/24/17 [id number])   PRN Meds:.acetaminophen (TYLENOL) oral liquid 160 mg/5 mL **OR** acetaminophen **OR** [DISCONTINUED] acetaminophen, albuterol   Vitals:   03/24/17 0700 03/24/17 0800  BP: (!) 163/109   Pulse: 92   Resp: (!) 27   Temp:  99.4 F (37.4 C)  SpO2: 92%      PHYSICAL EXAM: Blood pressure 125/79, pulse 74, temperature 98.7 F (37.1 C), temperature source Axillary, resp. rate (!) 26, height 5\' 7"  (1.702 m), weight 226 lb 13.7 oz (102.9 kg), SpO2 94 %.  Eyes: Pupils equal,No pallor Neck:no swelling CVS: -s1 s2 regular, no murmur Resp:Breath sounds equal;  coarse rales Abdomen:abd-soft, nondistendedBS+ Extremities:trace edema Neuro-power 5/5 Skin: no rash   Results for Micheal Levy,  Micheal (MRN [id number]) as of 03/24/2017 09:39  Ref. Range 03/24/2017 03:30  pH, Arterial Latest Ref Range: 7.350 - 7.450  7.323 (L)  pCO2 arterial Latest Ref Range: 32.0 - 48.0 mmHg 48.5 (H)  pO2, Arterial Latest Ref Range: 83.0 - 108.0 mmHg 81.7 (L)  Acid-base deficit Latest Ref Range: 0.0 - 2.0 mmol/L 0.8  Bicarbonate Latest Ref Range: 20.0 - 28.0 mmol/L 24.4  Results for Micheal Levy, Micheal (MRN [id number]) as of 03/24/2017 09:39  Ref. Range 03/24/2017 04:11  Sodium Latest Ref Range: 135 - 145 mmol/L 140  Potassium Latest Ref Range: 3.5 - 5.1 mmol/L 4.3  Chloride Latest Ref Range: 101 - 111 mmol/L 109  CO2 Latest Ref Range: 22 - 32 mmol/L 22  Glucose Latest Ref Range: 65 - 99 mg/dL 324111 (H)  BUN Latest Ref Range: 6 - 20 mg/dL 23 (H)  Creatinine Latest Ref Range: 0.61 - 1.24 mg/dL 4.012.23 (H)  Calcium Latest Ref Range: 8.9 - 10.3 mg/dL 8.3 (L)  Anion gap Latest Ref Range: 5 - 15  9  Alkaline Phosphatase Latest Ref Range: 38 - 126 U/L 54  Albumin Latest Ref Range: 3.5 - 5.0 g/dL 2.5 (L)  AST Latest Ref Range: 15 - 41 U/L 25  ALT Latest Ref Range: 17 - 63 U/L 14 (L)  Total Protein Latest Ref Range: 6.5 - 8.1 g/dL 5.5 (L)  Bilirubin, Direct Latest Ref Range: 0.1 - 0.5 mg/dL 0.5  Indirect Bilirubin Latest Ref Range: 0.3 - 0.9 mg/dL 1.0 (H)  Total Bilirubin Latest Ref Range: 0.3 - 1.2 mg/dL 1.5 (H)  GFR, Est Non African American Latest Ref Range: >60 mL/min 36 (L)  GFR, Est African American Latest  Ref Range: >60 mL/min 41 (L)  WBC Latest Ref Range: 4.0 - 10.5 K/uL 10.4  RBC Latest Ref Range: 4.22 - 5.81 MIL/uL 4.81  Hemoglobin Latest Ref Range: 13.0 - 17.0 g/dL 16.112.3 (L)  HCT Latest Ref Range: 39.0 - 52.0 % 38.9 (L)  MCV Latest Ref Range: 78.0 - 100.0 fL 80.9  MCH Latest Ref Range: 26.0 - 34.0 pg 25.6 (L)  MCHC Latest Ref Range: 30.0 - 36.0 g/dL 09.631.6  RDW Latest Ref Range: 11.5 - 15.5 % 16.6 (H)  Platelets Latest Ref Range: 150 - 400 K/uL 173    12/18 CXR Endotracheal and enteric tubes have  been removed. The cardiac silhouette remains enlarged. Patchy airspace opacities in the perihilar and basilar regions bilaterally demonstrate at most minimal mixed interval changes. No sizable pleural effusion or pneumothorax is identified.   12/16 Renal sono Right Kidney: Length: 12.6 cm. Echogenicity within normal limits. No mass or hydronephrosis visualized. Left Kidney: Length: 11.7 cm. Echogenicity within normal limits. No mass or hydronephrosis visualized. Bladder:decompressed by Foley catheter. IMPRESSION: No acute abnormality noted.     12/15 ct chest Cardiovascular: The heart is mildly enlarged. Minimal coronary artery calcification is seen. The great vessels are grossly unremarkable. The thoracic aorta is unremarkable in appearance. Mediastinum/Nodes: No definite mediastinal lymphadenopathy is seen. No pericardial effusion is identified. The visualized portions of the thyroid gland are unremarkable. No axillary lymphadenopathy is seen. The patient's endotracheal tube is seen ending 1-2 cm above the carina. Lungs/Pleura: Dense bilateral lower lobe airspace opacification is noted, with additional hazy opacity throughout both lungs. This is suspicious for bilateral pneumonia, though superimposed pulmonary edema is a concern. Aspiration pneumonia cannot be excluded. No pleural effusion or pneumothorax is seen. Upper Abdomen: The visualized portions of the liver and spleen are unremarkable. The visualized portions of the gallbladder, pancreas, adrenal glands and kidneys are within normal limits. The patient's enteric tube extends to the body of the stomach. Musculoskeletal: No acute osseous abnormalities are identified. The visualized musculature is unremarkable in appearance. IMPRESSION: 1. Dense bilateral lower lobe airspace opacification, with additional hazy opacity throughout both lungs. This is suspicious for bilateral pneumonia, though  superimposed pulmonary edema is a concern. Aspiration pneumonia cannot be excluded, given the patient's presentation. 2. Mild cardiomegaly.     Ekg-12/16sinus-,LVH, Biatrial enlargement, Repolarization abnormality  12/16 echo Left ventricle: The cavity size was normal. Wall thickness was increased in a pattern of severe LVH. Systolic function was normal. The estimated ejection fraction was in the range of 50% to 55%. Wall motion was normal; there were no regional wall motion abnormalities. Features are consistent with a pseudonormal left ventricular filling pattern, with concomitant abnormal relaxation and increased filling pressure (grade 2 diastolic dysfunction). - Mitral valve: There was trivial regurgitation. - Right atrium: Central venous pressure (est): 15 mm Hg. - Tricuspid valve: There was mild regurgitation. - Pulmonary arteries: PA peak pressure: 34 mm Hg (S). 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 Trivial mitral regurgitation. Mild tricuspid regurgitation with PASP estimated 34 mmHg.  12/16 MR brain 1. Scattered punctate acute white matter infarcts in both cerebral hemispheres which may reflect watershed ischemia. 2. 2.5 cm right middle cranial fossa arachnoid cyst. 3. 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 These are typically incidental though rarely can be a cause of seizures.  12.16.18 EEG Abnormalities:1)sedated EEG Clinical Interpretation: ThisEEG is consistent with the patient'ssedated state. There was no  seizure or seizure predisposition recorded on this study. Please note that a normal EEG does not preclude the possibility of epilepsy.   Assessment- Plan:   S/p Acute hypoxicrespiratory failure secondary toencephalopathy/ seizures/AspirationPneumonia  HTN Emergency/H/O HTN  acute white  matter infarcts in both cerebral hemispheres which may reflect watershed ischemia.  s/p Anion Gap Metabolic Acidosissecondary to lactic acidosis   S/p Acute renal failure secondary to pre-renal vs renal (hypertensive nephrosclerosis)  S/p Hyperglycemia in setting of HONK  S/p Encephalopathy in setting of PRES vs Polysubstance abuse vs Hyperglycemia   S/p Seizures in setting of hyperglycemia vs Cocaine use   Polysubstance Abuse     Neurology On Kepra  f/u Neurology   pending MRA brain and carotid USG   Cardiology Cardiology f/u noted optimize BP>>added metoprolol 25mg  tid po   Pulmonary Post extubation-> IS, Acapella Wean 02 antibx Reduce IV fluids   GI /nutrition Swallow screen, then po diet    Renal, fluids, electrolytes Monitor renal fxn> improved optimize lytes   Hematology Monitor blood counts   Endocrinology Sliding scale insulin  Infectious diseases D/c vanco Complete 7 days' zosyn F/u c/s   Lines -Devices 12/15 ETT 12/16 Gastric tube, foley    Counseling Patient/Family:Updated /counseled wife Prognosis guarded   icu care today  Thank you for letting me participate in the care of your patient.  ^^^^^^^^^^ I  Have personally spent    50  Minutes  In the care of this Patient providing Critical care Services; Time includes review of chart, labs, imaging, coordinating care with other physicians and healthcare team members. Also includes time for frequent reevaluation and additional treatment implementation due to change in clinical condiiton of patient. Excludes time spent for Procedure and Teaching.   ^^^^^^^^^^  Note subject to typographical and grammatical errors;   Any formal questions or concerns about the content, text, or information contained within the body of this dictation should be directly addressed to the physician  for  clarification.   Caryl Comes, MD Pulmonary and Critical Care  Medicine [facility] Pager: [phone]

## 2017-03-24 NOTE — Progress Notes (Signed)
Pt self extubation post lab draw, pt placed on 100% NRB. Pt examined by CCM at bedside. Pt becoming more alert, responding to questions and following commands. Pt placed on HFNC at 15L. Pt vitals stable, will continue to monitor.

## 2017-03-24 NOTE — Progress Notes (Signed)
RT NOTE:  RT called to bedside due to patient self extubating. Pt placed on 100% NRB. Pt slowly coming around and answering questions. SpO2 100%. CCM paged to bedside. RT awaiting further instruction.

## 2017-03-24 NOTE — Progress Notes (Signed)
STROKE TEAM PROGRESS NOTE   SUBJECTIVE (INTERVAL HISTORY) His girlfriend is at the bedside. Pt self extubated overnight, tolerating well, now sitting in chair, AAOx3, mild right UE weakness. Still has high BP.    OBJECTIVE Temp:  [98.5 F (36.9 C)-99.4 F (37.4 C)] 99.4 F (37.4 C) (12/18 0800) Pulse Rate:  [34-105] 82 (12/18 1032) Cardiac Rhythm: Normal sinus rhythm (12/18 0400) Resp:  [13-36] 27 (12/18 1032) BP: (99-209)/(58-135) 199/124 (12/18 1037) SpO2:  [89 %-100 %] 94 % (12/18 1032) FiO2 (%):  [40 %-100 %] 100 % (12/18 0430)  Recent Labs  Lab 03/23/17 1953 03/23/17 2333 03/24/17 0355 03/24/17 0756 03/24/17 1205  GLUCAP 112* 141* 118* 161* 177*   Recent Labs  Lab 03/22/17 0357  03/22/17 1401 03/22/17 1821 03/23/17 0337 03/23/17 1107 03/24/17 0411  NA 139   < > 135 136 137 139 140  K 3.4*   < > 4.3 4.1 3.6 4.0 4.3  CL 105   < > 104 104 105 107 109  CO2 23   < > '22 24 24 24 22  '$ GLUCOSE 190*   < > 294* 172* 121* 145* 111*  BUN 11   < > '17 20 19 '$ 22* 23*  CREATININE 2.42*   < > 2.86* 2.70* 2.60* 2.50* 2.23*  CALCIUM 8.4*   < > 7.9* 8.0* 8.3* 8.1* 8.3*  MG 1.9  --   --  2.4 2.4  --   --   PHOS <1.0*  --   --  4.4 3.9  --   --    < > = values in this interval not displayed.   Recent Labs  Lab 03/21/17 2024 03/24/17 0411  AST 35 25  ALT 28 14*  ALKPHOS 137* 54  BILITOT 0.7 1.5*  PROT 7.7 5.5*  ALBUMIN 4.2 2.5*   Recent Labs  Lab 03/21/17 2024 03/21/17 2148 03/22/17 0357 03/23/17 0337 03/24/17 0411  WBC 9.3 25.0* 13.4* 10.6* 10.4  NEUTROABS 5.1 23.0*  --   --   --   HGB 15.5 15.9 13.7 11.6* 12.3*  HCT 45.7 46.6 40.7 35.8* 38.9*  MCV 76.0* 75.6* 74.8* 76.2* 80.9  PLT 232 233 242 176 173   Recent Labs  Lab 03/21/17 2230 03/22/17 0357 03/22/17 0801 03/22/17 1821  CKTOTAL  --   --   --  445*  TROPONINI 0.07* 0.36* 0.36*  --    No results for input(s): LABPROT, INR in the last 72 hours. Recent Labs    03/21/17 2205  COLORURINE STRAW*   LABSPEC 1.026  PHURINE 7.0  GLUCOSEU >=500*  HGBUR SMALL*  BILIRUBINUR NEGATIVE  KETONESUR NEGATIVE  PROTEINUR >=300*  NITRITE NEGATIVE  LEUKOCYTESUR NEGATIVE       Component Value Date/Time   CHOL 83 03/22/2017 0801   TRIG 174 (H) 03/22/2017 0801   HDL 23 (L) 03/22/2017 0801   CHOLHDL 3.6 03/22/2017 0801   VLDL 35 03/22/2017 0801   LDLCALC 25 03/22/2017 0801   No results found for: HGBA1C    Component Value Date/Time   LABOPIA NONE DETECTED 03/21/2017 2205   COCAINSCRNUR POSITIVE (A) 03/21/2017 2205   LABBENZ POSITIVE (A) 03/21/2017 2205   AMPHETMU NONE DETECTED 03/21/2017 2205   THCU NONE DETECTED 03/21/2017 2205   LABBARB NONE DETECTED 03/21/2017 2205    Recent Labs  Lab 03/21/17 2213  ETH <10    I have personally reviewed the radiological images below and agree with the radiology interpretations.  Ct Head and cervical Wo Contrast  03/21/2017 IMPRESSION: 1. No acute intracranial abnormality. Incidental note of right middle cranial fossa arachnoid cyst and tiny quadrigeminal lipoma, typically of no clinical significance. 2. No acute fracture or subluxation of the cervical spine. 3. Small vertebral body lucencies within multiple vertebral bodies are nonspecific. This may reflect focal osteopenia or hemangiomas, however metastasis or multiple myeloma could have a similar appearance, which would be unusual given patient's age. MRI could be considered for further evaluation. 4. Right apical consolidation has a slightly mass like appearance, only partially included in the field of view. Differential considerations include pneumonia, aspiration or less likely neoplasm. This is not well seen on chest radiograph obtained today, consider chest CT characterization.   Ct Chest Wo Contrast 03/22/2017 IMPRESSION: 1. Dense bilateral lower lobe airspace opacification, with additional hazy opacity throughout both lungs. This is suspicious for bilateral pneumonia, though superimposed  pulmonary edema is a concern. Aspiration pneumonia cannot be excluded, given the patient's presentation. 2. Mild cardiomegaly.   Mr Brain Wo Contrast 03/22/2017 IMPRESSION: 1. Scattered punctate acute white matter infarcts in both cerebral hemispheres which may reflect watershed ischemia. 2. 2.5 cm right middle cranial fossa arachnoid cyst. 3. 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 These are typically incidental though rarely can be a cause of seizures.   US Renal 03/22/2017 IMPRESSION: No acute abnormality noted.   EEG Clinical Interpretation: This EEG is consistent with the patient's sedated state. There was no seizure or seizure predisposition recorded on this study. Please note that a normal EEG does not preclude the possibility of epilepsy.   LE venous doppler - neg for DVT  MRA head No large vessel occlusion or correctable proximal stenosis. Distal vessel detail is poor/ not reliable because of motion degradation.  Carotid Doppler  Pending  Renal artery [place]  Pending   TTE -  Severe LVH with LVEF 50-55% and grade 2 diastolic dysfunction.   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 Trivial mitral   regurgitation. Mild tricuspid regurgitation with PASP estimated   34 mmHg.   PHYSICAL EXAM  Temp:  [98.5 F (36.9 C)-99.4 F (37.4 C)] 99.4 F (37.4 C) (12/18 0800) Pulse Rate:  [34-105] 82 (12/18 1032) Resp:  [13-36] 27 (12/18 1032) BP: (99-209)/(58-135) 199/124 (12/18 1037) SpO2:  [89 %-100 %] 94 % (12/18 1032) FiO2 (%):  [40 %-100 %] 100 % (12/18 0430)  General - Well nourished, well developed, intubated.  Ophthalmologic - fundi not visualized due to noncooperation.  Cardiovascular - Regular rate and rhythm  Neuro - intubated on sedation, not response to voice, not following commands, eyes closed. On forced eye opening, pupils equal, but very sluggish papillary reflexes, no significant doll's eyes, weak corneal b/l, positive  gag. On pain stimulation, LUE and LLE localizing to pain and withdraw against gravities. RUE and RLE withdraw but not able to against gravity. DTR 1+ and bilateral babinski. Sensation, coordination and gait not tested.   ASSESSMENT/PLAN Mr. Micheal Levy is a 36 y.o. male with history of HTN admitted for seizure x 2 and unresponsiveness. Found to have severe hyperglycemia and CKD with positive cocaine. Intubated on sedation.   Seizure - likely related to hyperglycemia, hypertensive emergency, CKD and cocaine use  EEG no seizure   On keppra '500mg'$  bid  No seizure like activity on sedation  Seizure precautions.  Stroke:  bilateral punctate watershed infarct - likely secondary to from BP high 200s to low 100s, less likely due to endocarditis or cocaine related arteriopathy at  this time  Resultant mild RUE weakness  MRI  Bilateral cerebral watershed infarcts  MRA  neg  Carotid Doppler  pending  2D Echo  EF 50-55% but severe LVH  LE venous doppler - neg for DVT  Renal artery [place] pending  LDL 25  HgbA1c pending  Arixtra for VTE prophylaxis  Diet heart healthy/carb modified Room service appropriate? Yes; Fluid consistency: Thin   aspirin 81 mg daily prior to admission, now on aspirin 81 mg daily.   Ongoing aggressive stroke risk factor management  Therapy recommendations:  pending  Disposition:  pending  Diabetes  HgbA1c pending goal < 7.0  Uncontrolled  Hyperglycemia on admission needed insulin drip  Off insulin drip   CBG monitoring  SSI  DM education and close PCP follow up  CKD  Elevated Cre 2.42->2.69->2.70->2.60->2.23  Likely chronic given hypertensive emergency and hyperglycemia  Close monitoring  Hypertensive emergency - malignant HTN Still high  Due to watershed infarcts, recommend to gradually decrease BP within next 5-7 days.  Avoid abrupt BP dropping  Long term BP goal normotensive  Malignant HTN work up with labs - ordered in  am  Renal artery [place] - pending  Cocaine use   UDS positive for cocaine  Wife does not know any previous use  Cessation education will be provided later  Fever - one time event  Blood culture NGTD  Afebrile now  Continue to monitor  Leukocytosis - 9.3->25.0->13.4->10.6->10.4  Tobacco abuse  Current smoker  Smoking cessation counseling will be provided  Other Stroke Risk Factors  Obesity, Body mass index is 35.53 kg/m.   Other Active Problems  Self extubated  Hospital day # 3  This patient is critically ill due to seizure, stroke, hyperglycemia, hypertensive emergency, fever, cocaine use and at significant risk of neurological worsening, death form status epilepticus, recurrent stroke, sepsis, heart failure. This patient's care requires constant monitoring of vital signs, hemodynamics, respiratory and cardiac monitoring, review of multiple databases, neurological assessment, discussion with family, other specialists and medical decision making of high complexity. I had long discussion with wife at bedside, updated pt current condition, treatment plan and potential prognosis. She expressed understanding and appreciation. I also discussed with Dr. Pati Gallo in the hallway. I spent 35 minutes of neurocritical care time in the care of this patient.   Rosalin Hawking, MD PhD Stroke Neurology 03/24/2017 1:15 PM    To contact Stroke Continuity provider, please refer to [email or web address] After hours, contact General Neurology

## 2017-03-24 NOTE — Progress Notes (Signed)
Nutrition Follow-up  DOCUMENTATION CODES:   Obesity unspecified  INTERVENTION:   Encourage intake at meals and snacks  Provide diet education prior to d/c as appropriate.   NUTRITION DIAGNOSIS:   Inadequate oral intake related to (recent extubation) as evidenced by meal completion < 50%. Ongoing.   GOAL:   Patient will meet greater than or equal to 90% of their needs Progressing.   MONITOR:   PO intake  ASSESSMENT:   Pt with PMH of HTN presents with hyperglycemia after 2 witnessed seizures/encephalopathy requiring intubation   Pt discussed during ICU rounds and with RN.  Pt self extubated overnight Girlfriend feeding pt clear liquids - meal completion <50%, diet advanced for lunch  Medications and labs reviewed  No results found for: HGBA1C - pending  Diet Order:  Diet heart healthy/carb modified Room service appropriate? Yes; Fluid consistency: Thin  EDUCATION NEEDS:   No education needs have been identified at this time  Skin:  Skin Assessment: Reviewed RN Assessment  Last BM:  Unknown BM date  Height:   Ht Readings from Last 1 Encounters:  03/22/17 5\' 7"  (1.702 m)    Weight:   Wt Readings from Last 1 Encounters:  03/22/17 226 lb 13.7 oz (102.9 kg)    Ideal Body Weight:  67 kg  BMI:  Body mass index is 35.53 kg/m.  Estimated Nutritional Needs:   Kcal:  2000-2200  Protein:  100-110 grams  Fluid:  >/= 2 L/d  Kendell BaneHeather Rook Maue RD, LDN, CNSC [phone] Pager [phone] After Hours Pager

## 2017-03-25 ENCOUNTER — Inpatient Hospital Stay (HOSPITAL_COMMUNITY): Payer: Self-pay

## 2017-03-25 DIAGNOSIS — I161 Hypertensive emergency: Secondary | ICD-10-CM

## 2017-03-25 LAB — GLUCOSE, CAPILLARY
GLUCOSE-CAPILLARY: 145 mg/dL — AB (ref 65–99)
GLUCOSE-CAPILLARY: 148 mg/dL — AB (ref 65–99)
GLUCOSE-CAPILLARY: 153 mg/dL — AB (ref 65–99)
GLUCOSE-CAPILLARY: 154 mg/dL — AB (ref 65–99)
Glucose-Capillary: 175 mg/dL — ABNORMAL HIGH (ref 65–99)
Glucose-Capillary: 184 mg/dL — ABNORMAL HIGH (ref 65–99)

## 2017-03-25 LAB — BASIC METABOLIC PANEL
Anion gap: 11 (ref 5–15)
BUN: 16 mg/dL (ref 6–20)
CO2: 25 mmol/L (ref 22–32)
Calcium: 8.5 mg/dL — ABNORMAL LOW (ref 8.9–10.3)
Chloride: 102 mmol/L (ref 101–111)
Creatinine, Ser: 1.51 mg/dL — ABNORMAL HIGH (ref 0.61–1.24)
GFR calc Af Amer: >60 mL/min (ref >60)
GFR, EST NON AFRICAN AMERICAN: 57 mL/min — AB (ref >60)
GLUCOSE: 158 mg/dL — AB (ref 65–99)
POTASSIUM: 2.9 mmol/L — AB (ref 3.5–5.1)
Sodium: 138 mmol/L (ref 135–145)

## 2017-03-25 LAB — CULTURE, RESPIRATORY W GRAM STAIN

## 2017-03-25 LAB — CBC
HCT: 36.6 % — ABNORMAL LOW (ref 39.0–52.0)
Hemoglobin: 12.3 g/dL — ABNORMAL LOW (ref 13.0–17.0)
MCH: 26.2 pg (ref 26.0–34.0)
MCHC: 33.6 g/dL (ref 30.0–36.0)
MCV: 78 fL (ref 78.0–100.0)
PLATELETS: 192 10*3/uL (ref 150–400)
RBC: 4.69 MIL/uL (ref 4.22–5.81)
RDW: 15.5 % (ref 11.5–15.5)
WBC: 10.7 10*3/uL — ABNORMAL HIGH (ref 4.0–10.5)

## 2017-03-25 LAB — CULTURE, RESPIRATORY

## 2017-03-25 LAB — HEMOGLOBIN A1C
Hgb A1c MFr Bld: 11.2 % — ABNORMAL HIGH (ref 4.8–5.6)
Mean Plasma Glucose: 274.74 mg/dL

## 2017-03-25 MED ORDER — POTASSIUM CHLORIDE CRYS ER 20 MEQ PO TBCR
60.0000 meq | EXTENDED_RELEASE_TABLET | Freq: Once | ORAL | Status: AC
  Administered 2017-03-25: 60 meq via ORAL
  Filled 2017-03-25: qty 3

## 2017-03-25 MED ORDER — HYDROCHLOROTHIAZIDE 25 MG PO TABS
25.0000 mg | ORAL_TABLET | Freq: Every day | ORAL | Status: DC
  Administered 2017-03-25 – 2017-03-26 (×2): 25 mg via ORAL
  Filled 2017-03-25 (×2): qty 1

## 2017-03-25 MED ORDER — ASPIRIN EC 325 MG PO TBEC
325.0000 mg | DELAYED_RELEASE_TABLET | Freq: Every day | ORAL | Status: DC
  Administered 2017-03-26: 325 mg via ORAL
  Filled 2017-03-25: qty 1

## 2017-03-25 MED ORDER — HYDRALAZINE HCL 50 MG PO TABS
50.0000 mg | ORAL_TABLET | Freq: Three times a day (TID) | ORAL | Status: DC
  Administered 2017-03-25 – 2017-03-26 (×3): 50 mg via ORAL
  Filled 2017-03-25 (×4): qty 1

## 2017-03-25 MED ORDER — AMOXICILLIN-POT CLAVULANATE 875-125 MG PO TABS
1.0000 | ORAL_TABLET | Freq: Two times a day (BID) | ORAL | Status: DC
  Administered 2017-03-25 – 2017-03-26 (×2): 1 via ORAL
  Filled 2017-03-25 (×2): qty 1

## 2017-03-25 MED ORDER — LIVING WELL WITH DIABETES BOOK
Freq: Once | Status: AC
  Administered 2017-03-25: 1
  Filled 2017-03-25: qty 1

## 2017-03-25 NOTE — Progress Notes (Signed)
Preliminary results by tech - Renal arteries are patent bilaterally without evidence of a significant stenosis. Marilynne Halstedita Jordis Repetto, BS, RDMS, RVT

## 2017-03-25 NOTE — Progress Notes (Signed)
Physical Therapy Treatment Patient Details Name: Micheal Levy MRN: [id number] DOB: 1980/06/10 Today's Date: 03/25/2017    History of Present Illness Pt is a 36 y.o. male who presented to ED on 03/21/17 after 2 witnessed seizures; pt altered and combative requiring intubation; + for cocaine. CT head negative. 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 EEG negative for seizures. Self-extubated 12/18. Pertinent PMH includes HTN.    PT Comments    Pt making steady progress with mobility and balance.   Follow Up Recommendations  Outpatient PT(pending progression)     Equipment Recommendations  (TBD)    Recommendations for Other Services OT consult     Precautions / Restrictions Precautions Precautions: Fall Restrictions Weight Bearing Restrictions: No    Mobility  Bed Mobility Overal bed mobility: Needs Assistance Bed Mobility: Supine to Sit;Sit to Supine     Supine to sit: Supervision;HOB elevated Sit to supine: Supervision;HOB elevated   General bed mobility comments: Incr time to perform  Transfers Overall transfer level: Needs assistance Equipment used: None Transfers: Sit to/from Stand Sit to Stand: Min guard         General transfer comment: Assist for balance  Ambulation/Gait Ambulation/Gait assistance: Min guard;Min assist Ambulation Distance (Feet): 300 Feet Assistive device: None Gait Pattern/deviations: Step-through pattern;Decreased stride length;Drifts right/left Gait velocity: Decreased Gait velocity interpretation: Below normal speed for age/gender General Gait Details: Pt required min guard for safety. Pt with drifting to rt. Required min assist for balance when making 180 degree turn to back up to bed. Pt with preference to look rt when amb. Amb on RA with SpO2 >97%.   Stairs            Wheelchair Mobility    Modified Rankin (Stroke Patients  Only) Modified Rankin (Stroke Patients Only) Pre-Morbid Rankin Score: No symptoms Modified Rankin: Moderately severe disability     Balance Overall balance assessment: Needs assistance Sitting-balance support: No upper extremity supported;Feet supported Sitting balance-Leahy Scale: Good       Standing balance-Leahy Scale: Fair Standing balance comment: Static standing with supervision. Requires min assist for single leg stance or Rhomberg                            Cognition Arousal/Alertness: Awake/alert Behavior During Therapy: Flat affect Overall Cognitive Status: Impaired/Different from baseline Area of Impairment: Orientation;Attention;Memory;Following commands;Safety/judgement;Awareness                 Orientation Level: Disoriented to;Place;Situation Current Attention Level: Selective Memory: Decreased short-term memory Following Commands: Follows multi-step commands consistently Safety/Judgement: Decreased awareness of deficits Awareness: Emergent          Exercises      General Comments        Pertinent Vitals/Pain Pain Assessment: No/denies pain    Home Living                      Prior Function            PT Goals (current goals can now be found in the care plan section) Progress towards PT goals: Progressing toward goals    Frequency    Min 4X/week      PT Plan Current plan remains appropriate    Co-evaluation              AM-PAC PT "6 Clicks" Daily Activity  Outcome Measure  Difficulty turning over in  bed (including adjusting bedclothes, sheets and blankets)?: A Little Difficulty moving from lying on back to sitting on the side of the bed? : A Little Difficulty sitting down on and standing up from a chair with arms (e.g., wheelchair, bedside commode, etc,.)?: Unable Help needed moving to and from a bed to chair (including a wheelchair)?: A Little Help needed walking in hospital room?: A Little Help  needed climbing 3-5 steps with a railing? : A Little 6 Click Score: 16    End of Session Equipment Utilized During Treatment: Gait belt Activity Tolerance: Patient tolerated treatment well Patient left: with family/visitor present;in bed;with call bell/phone within reach(Vasular entering room for test) Nurse Communication: Mobility status;Other (comment)(left O2 off) PT Visit Diagnosis: Unsteadiness on feet (R26.81)     Time: [id number] PT Time Calculation (min) (ACUTE ONLY): 16 min  Charges:  $Gait Training: 8-22 mins                    G Codes:       [facility] A[facility] Penrod PT [phone]    Angelina OkCary W [facility] 03/25/2017, 10:26 AM

## 2017-03-25 NOTE — Progress Notes (Signed)
CRITICAL CARE PROGRESS NOTE    Patient Summary:       ICU day #4 Hospital day #4  Name:Micheal Levy AVW:[id number] DOB:08-01-1980 ADMISSION DATE:03/21/2017 CONSULTATION DATE:03/21/2017 REFERRING MD:Dr. Ethelda ChickJacubowitz CHIEF COMPLAINT:Seizure HISTORY OF PRESENT ILLNESS: 36 year old male with PMH HTN Presentedto ED after 2 witnessed seizures. EMS gave 2.5 Versed. Upon arrival BP 239/134, patient altered and combative requiring intubation. CT Head negative. Given Keppra 1gm.Glucose 773. WBC 9.3. +Cocaine.  PAST MEDICAL HISTORY :Hypertension  New Events doing well since Self extubation Mucoid sputum Taking PO BP suboptimal Denies SOB or pain. Voiding clear urine.    Scheduled Meds: . amLODipine  10 mg Oral Daily  . aspirin  81 mg Per Tube Daily  . fondaparinux (ARIXTRA) injection  2.5 mg Subcutaneous Daily  . hydrALAZINE  50 mg Oral Q8H  . insulin aspart  0-9 Units Subcutaneous Q4H  . levETIRAcetam  500 mg Oral BID  . metoprolol tartrate  25 mg Oral TID   Continuous Infusions: . piperacillin-tazobactam (ZOSYN)  IV 3.375 g (03/25/17 0606)   PRN Meds:.acetaminophen (TYLENOL) oral liquid 160 mg/5 mL **OR** acetaminophen **OR** [DISCONTINUED] acetaminophen, albuterol, labetalol    Vitals:   03/25/17 0800 03/25/17 0900  BP: (!) 150/108 (!) 158/110  Pulse: 80 80  Resp: (!) 26 18  Temp: 100 F (37.8 C)   SpO2: 97% 97%   Physical exam: Eyes: Pupils equal,No pallor Neck: no swelling CVS: -s1 s2 regular, no murmur Resp:Breath sounds equal;  coarse rales Abdomen:abd-soft, nondistendedBS+ Extremities:trace edema Neuro-power 5/5 Skin: no rash  CBC    Component Value Date/Time   WBC 10.7 (H) 03/25/2017 0222   RBC 4.69 03/25/2017 0222   HGB 12.3 (L) 03/25/2017 0222   HCT 36.6 (L) 03/25/2017 0222   PLT 192 03/25/2017 0222   MCV 78.0 03/25/2017 0222   MCH 26.2 03/25/2017 0222   MCHC 33.6 03/25/2017 0222   RDW 15.5  03/25/2017 0222   LYMPHSABS 1.0 03/21/2017 2148   MONOABS 1.0 03/21/2017 2148   EOSABS 0.0 03/21/2017 2148   BASOSABS 0.0 03/21/2017 2148      BMP Latest Ref Rng & Units 03/25/2017 03/24/2017 03/23/2017  Glucose 65 - 99 mg/dL 829(F158(H) 621(H111(H) 086(V145(H)  BUN 6 - 20 mg/dL 16 78(I23(H) 69(G22(H)  Creatinine 0.61 - 1.24 mg/dL 2.95(M1.51(H) 8.41(L2.23(H) 2.44(W2.50(H)  Sodium 135 - 145 mmol/L 138 140 139  Potassium 3.5 - 5.1 mmol/L 2.9(L) 4.3 4.0  Chloride 101 - 111 mmol/L 102 109 107  CO2 22 - 32 mmol/L 25 22 24   Calcium 8.9 - 10.3 mg/dL 1.0(U8.5(L) 8.3(L) 8.1(L)   03/25/17 CXR Cardiac enlargement. Vascular congestion and mild edema has improved. Bibasilar atelectasis has improved. No effusion.  03/24/17 MRA brain No large vessel occlusion or correctable proximal stenosis. Distal vessel detail is poor/ not reliable because of motion degradation.  12/16 Renal sono Right Kidney: Length: 12.6 cm. Echogenicity within normal limits. No mass or hydronephrosis visualized. Left Kidney: Length: 11.7 cm. Echogenicity within normal limits. No mass or hydronephrosis visualized. Bladder:decompressed by Foley catheter.IMPRESSION:No acute abnormality noted.  12/15 ct chest Cardiovascular: The heart is mildly enlarged. Minimal coronary artery calcification is seen. The great vessels are grossly unremarkable. The thoracic aorta is unremarkable in appearance. Mediastinum/Nodes: No definite mediastinal lymphadenopathy is seen. No pericardial effusion is identified. The visualized portions of the thyroid gland are unremarkable. No axillary lymphadenopathy is seen. The patient's endotracheal tube is seen ending 1-2 cm above the carina. Lungs/Pleura: Dense bilateral lower lobe airspace opacification is noted, with additional  hazy opacity throughout both lungs. This is suspicious for bilateral pneumonia, though superimposed pulmonary edema is a concern. Aspiration pneumonia cannot be excluded. No pleural effusion or  pneumothorax is seen. Upper Abdomen: The visualized portions of the liver and spleen are unremarkable. The visualized portions of the gallbladder, pancreas, adrenal glands and kidneys are within normal limits. The patient's enteric tube extends to the body of the stomach. Musculoskeletal: No acute osseous abnormalities are identified. The visualized musculature is unremarkable in appearance. IMPRESSION: 1. Dense bilateral lower lobe airspace opacification, with additional hazy opacity throughout both lungs. This is suspicious for bilateral pneumonia, though superimposed pulmonary edema is a concern. Aspiration pneumonia cannot be excluded, given the patient's presentation. 2. Mild cardiomegaly.   Ekg-12/16sinus-,LVH, Biatrial enlargement, Repolarization abnormality  12/16 echo Left ventricle: The cavity size was normal. Wall thickness was increased in a pattern of severe LVH. Systolic function was normal. The estimated ejection fraction was in the range of 50% to 55%. Wall motion was normal; there were no regional wall motion abnormalities. Features are consistent with a pseudonormal left ventricular filling pattern, with concomitant abnormal relaxation and increased filling pressure (grade 2 diastolic dysfunction). - Mitral valve: There was trivial regurgitation. - Right atrium: Central venous pressure (est): 15 mm Hg. - Tricuspid valve: There was mild regurgitation. - Pulmonary arteries: PA peak pressure: 34 mm Hg (S). 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 Trivial mitral regurgitation. Mild tricuspid regurgitation with PASP estimated 34 mmHg.  12/16 MR brain 1. Scattered punctate acute white matter infarcts in both cerebral hemispheres which may reflect watershed ischemia. 2. 2.5 cm right middle cranial fossa arachnoid cyst. 3. 12 mm left choroidal fissure cyst with mild  mass effect on the left hippocampus. These are typically incidental though rarely can be a cause of seizures.  12.16.18EEG Abnormalities:1)sedated EEG Clinical Interpretation: ThisEEG is consistent with the patient'ssedated state. There was no seizure or seizure predisposition recorded on this study. Please note that a normal EEG does not preclude the possibility of epilepsy.    03/24/17  Vascular Ultrasound   PRELIMINARY RESULTS* Findings suggest 1-39% internal carotid artery stenosis bilaterally.  The left vertebral artery is patent with antegrade flow. Unable to visualize the right vertebral artery.    Assessment- Plan:   S/p Acute hypoxicrespiratory failure secondary toencephalopathy/ seizures/AspirationPneumonia  HTN Emergency/H/O HTN  acute white matter infarcts in both cerebral hemispheres which may reflect watershed ischemia.  s/p Anion Gap Metabolic Acidosissecondary to lactic acidosis   S/p Acute renal failure secondary to pre-renal vs renal (hypertensive nephrosclerosis)  S/p Hyperglycemia in setting of HONK  S/p Encephalopathy in setting of PRES vs Polysubstance abuse vs Hyperglycemia   S/p Seizures in setting of hyperglycemia vs Cocaine use   Polysubstance Abuse  Neurology On Kepra  f/u Neurology    Cardiology Cardiology f/u noted optimize BP>> increased metoprolol /hydralazine; added norvasc. Resumed HCTZ F/u tests   for secondary causes of HTN   Pulmonary Post extubation-> IS, Acapella off 02    GI /nutrition   Diet as tolerated nutrition counseling   Renal, fluids, electrolytes Monitor renal fxn> improving optimize lytes- K supplemented Await Renal artery sono for RAS   Hematology Monitor blood counts   Endocrinology Sliding scale insulin  Infectious diseases Complete 7 days' zosyn F/u c/s   Updated/counseled wife     Transferred to Tele bed; PCCM Signing off; please  re-consult again if needed.  Thank you for letting me participate in the  care of your patient.  ^^^^^^^^^^ I  Have personally spent  45  Minutes  In the care of this Patient providing Critical care Services; Time includes review of chart, labs, imaging, coordinating care with other physicians and healthcare team members. Also includes time for frequent reevaluation and additional treatment implementation due to change in clinical condiiton of patient. Excludes time spent for Procedure and Teaching.   ^^^^^^^^^^  Note subject to typographical and grammatical errors;   Any formal questions or concerns about the content, text, or information contained within the body of this dictation should be directly addressed to the physician  for  clarification.   Caryl Comes, MD Pulmonary and Critical Care Medicine [facility] Pager: [phone]

## 2017-03-25 NOTE — Evaluation (Signed)
Occupational Therapy Evaluation Patient Details Name: Micheal Levy MRN: [id number] DOB: 08-Mar-1981 Today's Date: 03/25/2017    History of Present Illness Pt is a 36 y.o. male who presented to ED on 03/21/17 after 2 witnessed seizures; pt altered and combative requiring intubation; + for cocaine. CT head negative. 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 EEG negative for seizures. Self-extubated 12/18. Pertinent PMH includes HTN.   Clinical Impression   Pt admitted with above. He demonstrates the below listed deficits and will benefit from continued OT to maximize safety and independence with BADLs.  Pt presents to OT with very mild Rt hand weakness, as well as impaired cognition including slow processing, impaired memory, decreased orientation, impaired problem solving.  Visual pursuits and saccades are also very slow.  He currently requires min guard assist for ADLs.  BP 157/112.  PTA he lived with girlfriend and was independent with ADLs, as well as working full time in [employer].  Recommend OPOT and 24 hour supervision at discharge.       Follow Up Recommendations  Outpatient OT;Supervision/Assistance - 24 hour    Equipment Recommendations  Tub/shower seat    Recommendations for Other Services       Precautions / Restrictions Precautions Precautions: Fall Restrictions Weight Bearing Restrictions: No      Mobility Bed Mobility Overal bed mobility: Needs Assistance Bed Mobility: Supine to Sit;Sidelying to Sit   Sidelying to sit: Supervision Supine to sit: Supervision        Transfers Overall transfer level: Needs assistance Equipment used: None Transfers: Sit to/from Stand;Stand Pivot Transfers Sit to Stand: Min guard Stand pivot transfers: Min guard       General transfer comment: Assist for balance    Balance Overall balance assessment: Needs  assistance Sitting-balance support: No upper extremity supported;Feet supported Sitting balance-Leahy Scale: Good Sitting balance - Comments: able to don/doff socks without LOB EOB    Standing balance support: No upper extremity supported Standing balance-Leahy Scale: Fair                             ADL either performed or assessed with clinical judgement   ADL Overall ADL's : Needs assistance/impaired Eating/Feeding: Independent Eating/Feeding Details (indicate cue type and reason): ` Grooming: Wash/dry hands;Wash/dry face;Oral care;Brushing hair;Min guard;Standing   Upper Body Bathing: Set up;Sitting   Lower Body Bathing: Min guard;Sit to/from stand   Upper Body Dressing : Set up;Sitting;Supervision/safety   Lower Body Dressing: Min guard;Sit to/from stand   Toilet Transfer: Min guard;Ambulation;Comfort height toilet;Grab bars   Toileting- Clothing Manipulation and Hygiene: Min guard;Sit to/from stand       Functional mobility during ADLs: Min guard General ADL Comments: min guard assist for balance      Vision Baseline Vision/History: No visual deficits Patient Visual Report: No change from baseline Vision Assessment?: Yes Eye Alignment: Within Functional Limits Ocular Range of Motion: Within Functional Limits Alignment/Gaze Preference: Within Defined Limits Tracking/Visual Pursuits: Impaired - to be further tested in functional context(loses fixation at times.  Slow tracking ) Saccades: Decreased speed of saccadic movement Visual Fields: No apparent deficits Additional Comments: able to read cell phone screen      Perception Perception Perception Tested?: Yes   Praxis Praxis Praxis tested?: Within functional limits    Pertinent Vitals/Pain Pain Assessment: No/denies pain     Hand Dominance Right   Extremity/Trunk Assessment Upper Extremity Assessment  Upper Extremity Assessment: RUE deficits/detail RUE Deficits / Details: grip 4+/5    Lower Extremity Assessment Lower Extremity Assessment: Defer to PT evaluation   Cervical / Trunk Assessment Cervical / Trunk Assessment: Normal   Communication Communication Communication: No difficulties   Cognition Arousal/Alertness: Awake/alert Behavior During Therapy: Flat affect Overall Cognitive Status: Impaired/Different from baseline Area of Impairment: Orientation;Attention;Following commands;Safety/judgement;Awareness;Problem solving                 Orientation Level: Disoriented to;Time Current Attention Level: Selective Memory: Decreased short-term memory Following Commands: Follows multi-step commands inconsistently Safety/Judgement: Decreased awareness of safety;Decreased awareness of deficits Awareness: Intellectual Problem Solving: Slow processing;Requires verbal cues General Comments: Pt thinks it's January or Feb.  He is slow to process info.  Requires cues for multi step commands.  with prompting he states he's here due to seizure.  Does not recall that he had a CVA    General Comments  BP 157/112     Exercises     Shoulder Instructions      Home Living Family/patient expects to be discharged to:: Private residence Living Arrangements: Spouse/significant other;Children Available Help at Discharge: Family;Available 24 hours/day Type of Home: House Home Access: Stairs to enter [employer] of Steps: 3 Entrance Stairs-Rails: Right Home Layout: One level     Bathroom Shower/Tub: Chief Strategy Officer: Standard     Home Equipment: None          Prior Functioning/Environment Level of Independence: Independent        Comments: 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        OT Problem List: Decreased strength;Decreased activity tolerance;Impaired balance (sitting and/or standing);Impaired vision/perception;Decreased coordination;Decreased cognition;Decreased safety awareness;Cardiopulmonary status limiting  activity      OT Treatment/Interventions: Self-care/ADL training;DME and/or AE instruction;Therapeutic activities;Cognitive remediation/compensation;Visual/perceptual remediation/compensation;Patient/family education;Balance training    OT Goals(Current goals can be found in the care plan section) Acute Rehab OT Goals Patient Stated Goal: Plan to get back to work OT Goal Formulation: With patient Time For Goal Achievement: 04/08/17 Potential to Achieve Goals: Good ADL Goals Additional ADL Goal #1: Pt will be mod I with ADLs Additional ADL Goal #2: Pt will be able to alternate attention during familar tasks with min cues Additional ADL Goal #3: Pt will be able to locate items in visually challenging/cluttered environment with no cues Additional ADL Goal #4: Pt will perform path finding activity with min verbal cues  OT Frequency: Min 3X/week   Barriers to D/C:            Co-evaluation              AM-PAC PT "6 Clicks" Daily Activity     Outcome Measure Help from another person eating meals?: None Help from another person taking care of personal grooming?: A Little Help from another person toileting, which includes using toliet, bedpan, or urinal?: A Little Help from another person bathing (including washing, rinsing, drying)?: A Little Help from another person to put on and taking off regular upper body clothing?: A Little Help from another person to put on and taking off regular lower body clothing?: A Little 6 Click Score: 19   End of Session Nurse Communication: Mobility status  Activity Tolerance: Patient tolerated treatment well Patient left: in bed;with call bell/phone within reach  OT Visit Diagnosis: Unsteadiness on feet (R26.81);Cognitive communication deficit (R41.841) Symptoms and signs involving cognitive functions: Cerebral infarction  Time: [id number] OT Time Calculation (min): 13 min Charges:  OT General Charges $OT Visit: 1 Visit OT  Evaluation $OT Eval Moderate Complexity: 1 Mod G-Codes:     Reynolds AmericanWendi Fabiano Ginley, OTR/L [phone]   Jeani HawkingConarpe, Madoline Bhatt M 03/25/2017, 4:40 PM

## 2017-03-25 NOTE — Progress Notes (Signed)
OT Cancellation Note  Patient Details Name: Micheal Levy MRN: [id number] DOB: [birth year]   Cancelled Treatment:    Reason Eval/Treat Not Completed: Fatigue/lethargy limiting ability to participate.  Pt just finished with PT, had HA, and has received Fentanyl.  Will reattempt.  Chantavia Bazzle Westmorelandonarpe, OTR/L [phone]   Jeani HawkingConarpe, Brycelynn Stampley M 03/25/2017, 11:51 AM

## 2017-03-25 NOTE — Progress Notes (Signed)
STROKE TEAM PROGRESS NOTE   SUBJECTIVE (INTERVAL HISTORY) His wife is at the bedside. Pt lying in bed, doing well. BP much improved with PO meds. RUE weakness near normal now. Renal A [place] negative.     OBJECTIVE Temp:  [99 F (37.2 C)-100 F (37.8 C)] 100 F (37.8 C) (12/19 0800) Pulse Rate:  [59-112] 81 (12/19 1000) Cardiac Rhythm: Normal sinus rhythm (12/19 0800) Resp:  [12-36] 12 (12/19 1000) BP: (114-213)/(76-156) 158/110 (12/19 0900) SpO2:  [92 %-100 %] 94 % (12/19 1000)  Recent Labs  Lab 03/24/17 1538 03/24/17 2121 03/24/17 2345 03/25/17 0346 03/25/17 0801  GLUCAP 191* 202* 208* 148* 145*   Recent Labs  Lab 03/22/17 0357  03/22/17 1821 03/23/17 0337 03/23/17 1107 03/24/17 0411 03/25/17 0222  NA 139   < > 136 137 139 140 138  K 3.4*   < > 4.1 3.6 4.0 4.3 2.9*  CL 105   < > 104 105 107 109 102  CO2 23   < > '24 24 24 22 25  '$ GLUCOSE 190*   < > 172* 121* 145* 111* 158*  BUN 11   < > 20 19 22* 23* 16  CREATININE 2.42*   < > 2.70* 2.60* 2.50* 2.23* 1.51*  CALCIUM 8.4*   < > 8.0* 8.3* 8.1* 8.3* 8.5*  MG 1.9  --  2.4 2.4  --   --   --   PHOS <1.0*  --  4.4 3.9  --   --   --    < > = values in this interval not displayed.   Recent Labs  Lab 03/21/17 2024 03/24/17 0411  AST 35 25  ALT 28 14*  ALKPHOS 137* 54  BILITOT 0.7 1.5*  PROT 7.7 5.5*  ALBUMIN 4.2 2.5*   Recent Labs  Lab 03/21/17 2024 03/21/17 2148 03/22/17 0357 03/23/17 0337 03/24/17 0411 03/25/17 0222  WBC 9.3 25.0* 13.4* 10.6* 10.4 10.7*  NEUTROABS 5.1 23.0*  --   --   --   --   HGB 15.5 15.9 13.7 11.6* 12.3* 12.3*  HCT 45.7 46.6 40.7 35.8* 38.9* 36.6*  MCV 76.0* 75.6* 74.8* 76.2* 80.9 78.0  PLT 232 233 242 176 173 192   Recent Labs  Lab 03/21/17 2230 03/22/17 0357 03/22/17 0801 03/22/17 1821  CKTOTAL  --   --   --  445*  TROPONINI 0.07* 0.36* 0.36*  --    No results for input(s): LABPROT, INR in the last 72 hours. No results for input(s): COLORURINE, LABSPEC, [place], GLUCOSEU,  HGBUR, BILIRUBINUR, KETONESUR, PROTEINUR, UROBILINOGEN, NITRITE, LEUKOCYTESUR in the last 72 hours.  Invalid input(s): APPERANCEUR     Component Value Date/Time   CHOL 83 03/22/2017 0801   TRIG 174 (H) 03/22/2017 0801   HDL 23 (L) 03/22/2017 0801   CHOLHDL 3.6 03/22/2017 0801   VLDL 35 03/22/2017 0801   LDLCALC 25 03/22/2017 0801   Lab Results  Component Value Date   HGBA1C 11.2 (H) 03/25/2017      Component Value Date/Time   LABOPIA NONE DETECTED 03/21/2017 2205   COCAINSCRNUR POSITIVE (A) 03/21/2017 2205   LABBENZ POSITIVE (A) 03/21/2017 2205   AMPHETMU NONE DETECTED 03/21/2017 2205   THCU NONE DETECTED 03/21/2017 2205   LABBARB NONE DETECTED 03/21/2017 2205    Recent Labs  Lab 03/21/17 2213  ETH <10    I have personally reviewed the radiological images below and agree with the radiology interpretations.  Ct Head and cervical Wo Contrast 03/21/2017 IMPRESSION: 1.  No acute intracranial abnormality. Incidental note of right middle cranial fossa arachnoid cyst and tiny quadrigeminal lipoma, typically of no clinical significance. 2. No acute fracture or subluxation of the cervical spine. 3. Small vertebral body lucencies within multiple vertebral bodies are nonspecific. This may reflect focal osteopenia or hemangiomas, however metastasis or multiple myeloma could have a similar appearance, which would be unusual given patient's age. MRI could be considered for further evaluation. 4. Right apical consolidation has a slightly mass like appearance, only partially included in the field of view. Differential considerations include pneumonia, aspiration or less likely neoplasm. This is not well seen on chest radiograph obtained today, consider chest CT characterization.   Ct Chest Wo Contrast 03/22/2017 IMPRESSION: 1. Dense bilateral lower lobe airspace opacification, with additional hazy opacity throughout both lungs. This is suspicious for bilateral pneumonia, though superimposed  pulmonary edema is a concern. Aspiration pneumonia cannot be excluded, given the patient's presentation. 2. Mild cardiomegaly.   Mr Brain Wo Contrast 03/22/2017 IMPRESSION: 1. Scattered punctate acute white matter infarcts in both cerebral hemispheres which may reflect watershed ischemia. 2. 2.5 cm right middle cranial fossa arachnoid cyst. 3. 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 These are typically incidental though rarely can be a cause of seizures.   US Renal 03/22/2017 IMPRESSION: No acute abnormality noted.   EEG Clinical Interpretation: This EEG is consistent with the patient's sedated state. There was no seizure or seizure predisposition recorded on this study. Please note that a normal EEG does not preclude the possibility of epilepsy.   LE venous doppler - neg for DVT  MRA head No large vessel occlusion or correctable proximal stenosis. Distal vessel detail is poor/ not reliable because of motion degradation.  Carotid Doppler  1-39% internal carotid artery stenosis bilaterally. The left vertebral artery is patent with antegrade flow. Unable to visualize the right vertebral artery.  Renal artery US   Renal arteries are patent bilaterally without evidence of a significant stenosis.  TTE -  Severe LVH with LVEF 50-55% and grade 2 diastolic dysfunction.   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 Trivial mitral   regurgitation. Mild tricuspid regurgitation with PASP estimated   34 mmHg.   PHYSICAL EXAM  Temp:  [99 F (37.2 C)-100 F (37.8 C)] 100 F (37.8 C) (12/19 0800) Pulse Rate:  [59-112] 81 (12/19 1000) Resp:  [12-36] 12 (12/19 1000) BP: (114-213)/(76-156) 158/110 (12/19 0900) SpO2:  [92 %-100 %] 94 % (12/19 1000)  General - Well nourished, well developed, mildly sleepy  Ophthalmologic - fundi not visualized due to noncooperation.  Cardiovascular - Regular rate and rhythm  Mental Status -  Level of arousal and  orientation to time, place, and person were intact. Language including expression, naming, repetition, comprehension was assessed and found intact, mild to moderate dysarthria.  Cranial Nerves II - XII - II - Visual field intact OU. III, IV, VI - Extraocular movements intact. V - Facial sensation intact bilaterally. VII - Facial movement intact bilaterally. VIII - Hearing & vestibular intact bilaterally. X - Palate elevates symmetrically. XI - Chin turning & shoulder shrug intact bilaterally. XII - Tongue protrusion intact.  Motor Strength - The patient's strength was normal in all extremities except subtle right hand dexterity difficulty and pronator drift was absent.  Bulk was normal and fasciculations were absent.   Motor Tone - Muscle tone was assessed at the neck and appendages and was normal.  Reflexes - The patient's reflexes were 1+ in  all extremities and he had no pathological reflexes.  Sensory - Light touch, temperature/pinprick were assessed and were symmetrical.    Coordination - The patient had normal movements in the hands with no ataxia or dysmetria.  Tremor was absent.  Gait and Station - deferred   ASSESSMENT/PLAN Mr. Micheal Levy is a 35 y.o. male with history of HTN admitted for seizure x 2 and unresponsiveness. Found to have severe hyperglycemia and CKD with positive cocaine. Intubated on sedation.   Seizure - likely related to hyperglycemia, hypertensive emergency, CKD and cocaine use  EEG no seizure   On keppra '500mg'$  bid  No seizure like activity on sedation  Seizure precautions.  Stroke:  bilateral punctate watershed infarct - likely secondary to from BP high 200s to low 100s, less likely due to endocarditis or cocaine related arteriopathy at this time  Resultant mild RUE weakness  MRI  Bilateral cerebral watershed infarcts  MRA  neg  Carotid Doppler unremakable  2D Echo  EF 50-55% but severe LVH  LE venous doppler - neg for DVT  Renal  artery [place] negative for RAS  LDL 25  HgbA1c 11.2  Arixtra for VTE prophylaxis  Diet heart healthy/carb modified Room service appropriate? Yes; Fluid consistency: Thin   aspirin 81 mg daily prior to admission, now on aspirin 325 mg daily. Continue ASA on discharge.  Ongoing aggressive stroke risk factor management  Therapy recommendations:  pending  Disposition:  pending  Diabetes  HgbA1c 11.2, goal < 7.0  Uncontrolled  Hyperglycemia on admission needed insulin drip  Off insulin drip, now stable  CBG monitoring  SSI  DM education and close PCP follow up  AKI on CKD  Elevated Cre 2.42->2.69->2.70->2.60->2.23->1.51  Likely chronic given hypertensive emergency and hyperglycemia  Encourage PO fluid intake  Close monitoring  Hypertensive emergency - malignant HTN BP much improved.  Due to watershed infarcts, recommend to gradually decrease BP within next 5-7 days.  Avoid abrupt BP dropping  Long term BP goal normotensive  On amlodipine, hydralazine and metoprolol   Malignant HTN work up pending  Renal artery [place] - no RAS  Cocaine use   UDS positive for cocaine  Wife does not know any previous use  Cessation education provided  Pt is willing to quit  Fever - one time event  Blood culture NGTD  Afebrile now  Continue to monitor  Leukocytosis - 9.3->25.0->13.4->10.6->10.4->10.7  Tobacco abuse  Current smoker  Smoking cessation counseling provided  Pt is willing to quit  Other Stroke Risk Factors  Obesity, Body mass index is 35.53 kg/m.   Other Active Problems  Self extubated  Hospital day # 4  Neurology will sign off. Please call with questions. Pt will follow up with Cecille Rubin, NP, at [facility] in about 6 weeks. Thanks for the consult.   Rosalin Hawking, MD PhD Stroke Neurology 03/25/2017 11:33 AM    To contact Stroke Continuity provider, please refer to [email or web address] After hours, contact General Neurology

## 2017-03-25 NOTE — Progress Notes (Addendum)
Inpatient Diabetes Program Recommendations  AACE/ADA: New Consensus Statement on Inpatient Glycemic Control (2015)  Target Ranges:  Prepandial:   less than 140 mg/dL      Peak postprandial:   less than 180 mg/dL (1-2 hours)      Critically ill patients:  140 - 180 mg/dL   Lab Results  Component Value Date   GLUCAP 145 (H) 03/25/2017   HGBA1C 11.2 (H) 03/25/2017    Review of Glycemic Control  Diabetes history: No known prior hx Current orders for Inpatient glycemic control: Novolog correction 0-9 units q 4 hrs  Inpatient Diabetes Program Recommendations:   -New onset DM (A1c 11.2) Consult to dietician to speak with patient when appropriate, Living Well With Diabetes Book and patient education videos. 1:00 pm Spoke with patient and girlfriend @ bedside about new diagnosis. Discussed A1C results with them and explained what an A1C is, basic pathophysiology of DM Type 2, basic home care, basic diabetes diet nutrition principles, importance of checking CBGs and maintaining good CBG control to prevent long-term and short-term complications. Reviewed signs and symptoms of hyperglycemia and hypoglycemia and how to treat hypoglycemia at home. Also reviewed blood sugar goals at home. Patient states he has been drinking large amounts of sugary drinks prior to admission. RNs to provide ongoing basic DM education at bedside with this patient.  Thank you, Billy FischerJudy E. Tiajah Oyster, RN, MSN, CDE  Diabetes Coordinator Inpatient Glycemic Control Team Team Pager [phone] (8am-5pm) 03/25/2017 10:19 AM

## 2017-03-26 DIAGNOSIS — N179 Acute kidney failure, unspecified: Secondary | ICD-10-CM

## 2017-03-26 DIAGNOSIS — J189 Pneumonia, unspecified organism: Secondary | ICD-10-CM

## 2017-03-26 LAB — CBC
HCT: 37.3 % — ABNORMAL LOW (ref 39.0–52.0)
Hemoglobin: 12.1 g/dL — ABNORMAL LOW (ref 13.0–17.0)
MCH: 25.2 pg — AB (ref 26.0–34.0)
MCHC: 32.4 g/dL (ref 30.0–36.0)
MCV: 77.5 fL — ABNORMAL LOW (ref 78.0–100.0)
PLATELETS: 229 10*3/uL (ref 150–400)
RBC: 4.81 MIL/uL (ref 4.22–5.81)
RDW: 15.1 % (ref 11.5–15.5)
WBC: 8.4 10*3/uL (ref 4.0–10.5)

## 2017-03-26 LAB — BASIC METABOLIC PANEL
Anion gap: 8 (ref 5–15)
BUN: 14 mg/dL (ref 6–20)
CALCIUM: 8.5 mg/dL — AB (ref 8.9–10.3)
CO2: 27 mmol/L (ref 22–32)
CREATININE: 1.3 mg/dL — AB (ref 0.61–1.24)
Chloride: 104 mmol/L (ref 101–111)
GFR calc non Af Amer: >60 mL/min (ref >60)
GLUCOSE: 205 mg/dL — AB (ref 65–99)
Potassium: 3.4 mmol/L — ABNORMAL LOW (ref 3.5–5.1)
Sodium: 139 mmol/L (ref 135–145)

## 2017-03-26 LAB — GLUCOSE, CAPILLARY
GLUCOSE-CAPILLARY: 188 mg/dL — AB (ref 65–99)
GLUCOSE-CAPILLARY: 136 mg/dL — AB (ref 65–99)
GLUCOSE-CAPILLARY: 186 mg/dL — AB (ref 65–99)

## 2017-03-26 MED ORDER — METFORMIN HCL 500 MG PO TABS: 500.0000 mg | ORAL_TABLET | Freq: Two times a day (BID) | ORAL | 0 refills | Status: DC

## 2017-03-26 MED ORDER — AMOXICILLIN-POT CLAVULANATE 875-125 MG PO TABS: 1.0000 | ORAL_TABLET | Freq: Two times a day (BID) | ORAL | 0 refills | Status: DC

## 2017-03-26 MED ORDER — LEVETIRACETAM 500 MG PO TABS: 500.0000 mg | ORAL_TABLET | Freq: Two times a day (BID) | ORAL | 0 refills | Status: DC

## 2017-03-26 MED ORDER — HYDRALAZINE HCL 50 MG PO TABS
100.0000 mg | ORAL_TABLET | Freq: Three times a day (TID) | ORAL | Status: DC
  Administered 2017-03-26: 100 mg via ORAL
  Filled 2017-03-26: qty 2

## 2017-03-26 MED ORDER — HYDRALAZINE HCL 20 MG/ML IJ SOLN: 10.0000 mg | Freq: Once | INTRAMUSCULAR | Status: DC

## 2017-03-26 MED ORDER — HYDRALAZINE HCL 20 MG/ML IJ SOLN
10.0000 mg | Freq: Once | INTRAMUSCULAR | Status: AC
  Administered 2017-03-26: 10 mg via INTRAVENOUS
  Filled 2017-03-26: qty 1

## 2017-03-26 MED ORDER — POTASSIUM CHLORIDE CRYS ER 20 MEQ PO TBCR
40.0000 meq | EXTENDED_RELEASE_TABLET | Freq: Once | ORAL | Status: AC
  Administered 2017-03-26: 40 meq via ORAL
  Filled 2017-03-26: qty 2

## 2017-03-26 MED ORDER — METOPROLOL TARTRATE 25 MG PO TABS: 25.0000 mg | ORAL_TABLET | Freq: Three times a day (TID) | ORAL | 0 refills | Status: DC

## 2017-03-26 MED ORDER — HYDRALAZINE HCL 50 MG PO TABS: 50.0000 mg | ORAL_TABLET | Freq: Three times a day (TID) | ORAL | 0 refills | Status: DC

## 2017-03-26 MED ORDER — AMLODIPINE BESYLATE 10 MG PO TABS: 10.0000 mg | ORAL_TABLET | Freq: Every day | ORAL | 0 refills | Status: DC

## 2017-03-26 NOTE — Progress Notes (Signed)
Inpatient Diabetes Program Recommendations  AACE/ADA: New Consensus Statement on Inpatient Glycemic Control (2015)  Target Ranges:  Prepandial:   less than 140 mg/dL      Peak postprandial:   less than 180 mg/dL (1-2 hours)      Critically ill patients:  140 - 180 mg/dL   Lab Results  Component Value Date   GLUCAP 136 (H) 03/26/2017   HGBA1C 11.2 (H) 03/25/2017    Review of Glycemic Control Results for Micheal Levy, Micheal Levy (MRN [id number]) as of 03/26/2017 10:07  Ref. Range 03/25/2017 15:40 03/25/2017 19:57 03/25/2017 23:34 03/26/2017 04:53 03/26/2017 07:35  Glucose-Capillary Latest Ref Range: 65 - 99 mg/dL 409184 (H) 811154 (H) 914153 (H) 188 (H) 136 (H)   Inpatient Diabetes Program Recommendations:    Patient does not have insurance currently but will be able to get insurance through his employer starting in January 2019. Please consider Metformin on discharge and followup right away with PCP.  Thank you, Billy FischerJudy E. Glennon Kopko, RN, MSN, CDE  Diabetes Coordinator Inpatient Glycemic Control Team Team Pager [phone] (8am-5pm) 03/26/2017 10:12 AM

## 2017-03-26 NOTE — Plan of Care (Signed)
Nutrition Education Note  RD consulted for nutrition education regarding diabetes.   Lab Results  Component Value Date   HGBA1C 11.2 (H) 03/25/2017   Spoke with patient and significant other. However, discussion was largely held with significant other as patient, himself, showed little interest.   RD asked a little about the patients diet. They do eat out frequently. Pt also apparently drinks a very large quantity of sugar-sweetened beverages including Reg soda, juice, sweet tea and gatorade. They also eat white bread.  They had already reviewed the "Living with Diabetes" booklet. Significant other had questions about what exactly a carbohydrate was and what foods had carbohydrates. RD reviewed this information and Discussed different food groups and their effects on blood sugar.    First and foremost, RD told pt/sig other that the number 1 thing the patient can do is stop drinking the sugar-sweetened beverages. Doing this alone will have a profound affect on his blood sugars and may even lead to him not requiring medications or atleast lower amounts of them.   Significant other asked about how many carbs pt should have per meal.  RD recommended aiming for ~80g of carb/meal or 5 servings. RD attempted to discuss carbohydrate counting and serving sizes of common foods, however this ended up just causing confusion. RD directed her to only focus on the nutritional label. Showed picture of label and what numbers to specifically looked at, emphasizing the importance of portion size.   They eat out frequently and typically nutrition information is harder to find in these setting. Fortunately, both the patient and the significant other are technologically savvy. RD recommended downloading apps that can tell them the nutritional information of fast food items.    RD also showed picture of the My Plate Template. In general, their plates should consist of 50% veg, 25% protein and 25% carb. Explained how  this is the ideal group ratios for optimal BG control  RD recommended choosing whole grains over the white/refined grains. Explained this will also help with BG control   Girlfriend asked about sodium as well. RD reviewed that determining sodium intake can be accomplished in the same way by using the nutritional label. Recommended aiming for <2000 mg/day.   Summary of recommendations 1. Stop drinking sugar sweetened beverages. Can use sweeteners as way to wean off sugar 2. Aim for 5 servings or 80 g of Carb/meal 3. Make half of plate Fruit/veg, 25% protein and 25% carb 4. Switch to whole grains 5. Read nutritional labels. Use phone apps to help learn nutrition information for fast food  Expect fair compliance. The pt, himself showed no interest in our discussion (was on phone entire time) and said extremely little. His girlfriend, on the other hand, was the complete opposite and was extremely invested in our discussion, asked appropriate questions and expressed gratitude for RD visit.   During visit, RN came in to say that the MD had issued discharge orders. No further nutrition interventions warranted at this time.   Christophe LouisNathan Berish Bohman RD, LDN, CNSC Clinical Nutrition Pager: [phone] 03/26/2017 3:50 PM

## 2017-03-26 NOTE — Care Management Note (Signed)
Case Management Note  Patient Details  Name: Micheal Levy MRN: [id number] Date of Birth: October 24, 1980  Subjective/Objective:     From home with girlfriend, patient has no insurance, no PCP, N[facility]  On 12/27 at 9 am , brochure given to patient, also gave him br[facility] he can utilize the pharmacy for medication ast, they close at 5:30 Mon- Friday.  NCM spoke with patient about outpatient pt and outpatient ot that was recommended by PT and OT.  He will try to see if he can get some assistance in [facility] the [facility] , NCM put phone number on AVS for patient to call.  NCM informed [facility] to [facility] to get ast with medications, they will either be $4 or $10.  He states he will.                 Action/Plan: NCM will follow for dc needs.   Expected Discharge Date:                  Expected Discharge Plan:  Home/Self Care  In-House Referral:     Discharge planning Services  CM Consult, Medication Assistance, Follow-up appt scheduled, [facility]  Post Acute Care Choice:    Choice offered to:     DME Arranged:    DME Agency:     HH Arranged:    HH Agency:     Status of Service:  Completed, signed off  If discussed at [employer] Length [employer] Stay Meetings, dates discussed:    Additional Comments:  Leone Havenaylor, Lashandra Arauz Clinton, RN 03/26/2017, 2:46 PM

## 2017-03-26 NOTE — Discharge Summary (Signed)
Physician Discharge Summary  Micheal Levy CHE:[id number] DOB: [birth year] DOA: 03/21/2017  PCP: Patient, No Pcp Per  Admit date: 03/21/2017 Discharge date: 03/26/2017  Time spent: 45 minutes  Recommendations for Outpatient Follow-up:  Patient will be discharged to home with outpatient physical and occupational therapy.  Patient will need to follow up with primary care provider within one week of discharge, repeat BMP.  Follow-up with neurology, and 6 weeks. Patient should continue medications as prescribed.  Patient should follow a heart healthy diet.   Discharge Diagnoses:  Seizure Acute CVA Diabetes mellitus, type II/HONK Acute kidney injury on chronic kidney disease, unknown stage Hypertensive emergency/malignant hypertension Cocaine abuse/back abuse Questionable pneumonia with fever Acute encephalopathy, metabolic  Discharge Condition: Stable  Diet recommendation: heart healthy  Filed Weights   03/21/17 2009 03/22/17 0329  Weight: 113.4 kg (250 lb) 102.9 kg (226 lb 13.7 oz)    History of present illness:  On 03/21/2017 by Dr. Carlyon Prows  36 year old male with PMH HTN  Presents to ED after 2 witnessed seizures. EMS gave 2.5 Versed. Upon arrival BP 239/134, patient altered and combative requiring intubation. CT Head negative. Given Keppra 1gm. Glucose 773. WBC 9.3. +Cocaine. PCCM asked to admit.   Hospital Course:  Seizure -Likely secondary to hyperglycemia, hypertensive emergency, chronic kidney disease and cocaine use -EEG obtained showing no seizure -Continue Keppra 500 mg twice a day  Acute CVA -MRI showed scattered punctate acute white matter infarcts in both cerebral hemispheres, reflecting watershed ischemia -MRI unremarkable -Echocardiogram shows an EF of 50-55%, severe LVH -Lower extremity Doppler negative for DVT -Carotid Doppler 139% internal carotid stenosis bilaterally. Vertebral artery is patent. Unable to visualize right vertebral artery. -LDL 25,  hemoglobin A1c 11.2 -Neurology consulted and appreciated -PT/OT consulted, recommended outpatient therapy, type shower seat -Continue aspirin -Follow-up with neurology in 6 weeks   New onset Diabetes mellitus, type II/HONK -Hemoglobin A1c 11.2 -Patient did have hyperglycemia on admission and required insulin drip, transitioned to insulin sliding scale CBG monitoring -Patient will need follow-up with his primary care on discharge for close glucose monitoring and education -discussed with diabetes coordinator, will discharge with metformin  Acute kidney injury on chronic kidney disease, unknown stage -No prior creatinine prior to admission -Creatinine peaked at 2.86, has been trending downward, currently 1.30 -Suspect given patient's diabetes as well as hypertension patient would have chronic kidney disease  Hypertensive emergency/malignant hypertension -Per neurology, gradually decrease BP over the next 5-7 days -Continue amlodipine, hydralazine, metoprolol -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  Cocaine abuse/back abuse -UDS positive, patient educated on cessation -Discussed smoking cessation  Questionable pneumonia with fever -was placed on IV zosyn and will be discharged with augmentin for 3 days  -Per girlfriend, patient had pneumonia proximal 2 months ago which did not seem to clear -CT chest showed dense bilateral lobe airspace opacification with additional hazy opacity through the lungs, suspicious for bilateral pneumonia  Acute encephalopathy, metabolic -Resolved, suspect hyperglycemia versus polysubstance abuse versus seizure  Procedures: Carotid Doppler Echocardiogram EEG Renal ultrasound Intubation/extubation 12/15-12/18/18  Consultations: PCCM Neurology  Discharge Exam: Vitals:   03/26/17 1400 03/26/17 1436  BP: (!) 150/104 (!) 149/86  Pulse: 78 80  Resp: 18   Temp: 98.6 F (37 C)   SpO2: 100% 100%   Patient feeling better today, has no complaints.  Denies current chest pain, shortness breath, abdominal pain, nausea vomiting, diarrhea constipation, dizziness or headache.   General: Well developed, well nourished, NAD, appears stated age  56: NCAT,  mucous membranes moist.  Cardiovascular: S1 S2 auscultated, no rubs, murmurs or gallops. Regular rate and rhythm.  Respiratory: Denies breath sounds however clear  Abdomen: Soft, nontender, nondistended, + bowel sounds  Extremities: warm dry without cyanosis clubbing or edema  Neuro: AAOx3, nonfocal  Psych: appropriate  Discharge Instructions Discharge Instructions    Ambulatory referral to Neurology   Complete by:  As directed    Follow up with stroke clinic Cecille Rubin preferred, if not available, then consider [facility], [facility] or Jaynee Eagles whoever is available) at [facility] in about 6-8 weeks. Thanks.   Discharge instructions   Complete by:  As directed    Patient will be discharged to home with outpatient physical and occupational therapy.  Patient will need to follow up with primary care provider within one week of discharge, repeat BMP.  Follow-up with neurology, and 6 weeks. Patient should continue medications as prescribed.  Patient should follow a heart healthy diet.     Allergies as of 03/26/2017   No Known Allergies     Medication List    TAKE these medications   albuterol 108 (90 Base) MCG/ACT inhaler Commonly known as:  PROVENTIL HFA;VENTOLIN HFA Inhale 2 puffs into the lungs every 6 (six) hours as needed for wheezing or shortness of breath.   albuterol (2.5 MG/3ML) 0.083% nebulizer solution Commonly known as:  PROVENTIL Take 2.5 mg by nebulization every 6 (six) hours as needed for wheezing or shortness of breath.   amLODipine 10 MG tablet Commonly known as:  NORVASC Take 1 tablet (10 mg total) by mouth daily. Start taking on:  03/27/2017   amoxicillin-clavulanate 875-125 MG tablet Commonly known as:  AUGMENTIN Take 1 tablet by mouth every 12 (twelve)  hours.   aspirin EC 81 MG tablet Take 81 mg by mouth daily as needed (pain/headache).   hydrALAZINE 50 MG tablet Commonly known as:  APRESOLINE Take 1 tablet (50 mg total) by mouth every 8 (eight) hours.   hydrochlorothiazide 25 MG tablet Commonly known as:  HYDRODIURIL Take 25 mg by mouth daily.   levETIRAcetam 500 MG tablet Commonly known as:  KEPPRA Take 1 tablet (500 mg total) by mouth 2 (two) times daily.   metFORMIN 500 MG tablet Commonly known as:  GLUCOPHAGE Take 1 tablet (500 mg total) by mouth 2 (two) times daily with a meal.   metoprolol tartrate 25 MG tablet Commonly known as:  LOPRESSOR Take 1 tablet (25 mg total) by mouth 3 (three) times daily.      No Known Allergies Follow-up Information    Dennie Bible, NP. Schedule an appointment as soon as possible for a visit in 6 week(s).   Specialty:  Family Medicine Contact information: [address] [place] [address] [place] Follow up on 04/02/2017.   Why:  9 am for hospital follow up ,  Contact information: [place] [id number] [place] Follow up.   Why:  please utilize pharmacy for medication assistance, they are open Mon- Friday only 8 to 5:30 Contact information: [place] [address] [phone]       [place] Follow up.   Specialty:  Rehabilitation Why:  they will contact you for an appointment, if you do not hear from them in two days please call them.  also to  see if you can get assistance with paying for this call [phone].  Contact information: [address] [place] [id number] [place] [phone]           The results of significant diagnostics from this hospitalization (including imaging, microbiology,  ancillary and laboratory) are listed below for reference.    Significant Diagnostic Studies: Ct Head Wo Contrast  Result Date: 03/21/2017 CLINICAL DATA:  Seizure. Altered level of consciousness (LOC), unexplained; C-spine trauma, ligamentous injury suspected. EXAM: CT HEAD WITHOUT CONTRAST CT CERVICAL SPINE WITHOUT CONTRAST TECHNIQUE: Multidetector CT imaging of the head and cervical spine was performed following the standard protocol without intravenous contrast. Multiplanar CT image reconstructions of the cervical spine were also generated. COMPARISON:  None. FINDINGS: CT HEAD FINDINGS Brain: CSF density structure in the right middle cranial fossa measures approximately 3 cm and is consistent with an arachnoid cyst. A 10 mm curvilinear fat density structure in the quadrigeminal cistern is likely an incidental lipoma. No associated mass effect. No intracranial hemorrhage or evidence of ischemia. No hydrocephalus. No subdural or intracranial fluid collection. Vascular: No hyperdense vessel. Skull: No fracture or focal lesion. Sinuses/Orbits: Pharyngeal secretions likely secondary to intubation. Paranasal sinuses and mastoid air cells are clear. The visualized orbits are unremarkable. Other: None. CT CERVICAL SPINE FINDINGS Alignment: Reversal of normal lordosis. Skull base and vertebrae: No acute fracture. Vertebral body heights are maintained. The dens and skull base are intact. There are small subcentimeter lucencies within C2, C3, C4 and T1 vertebral bodies. Largest measures 6 mm. Soft tissues and spinal canal: No prevertebral fluid or swelling. No visible canal hematoma. Disc levels: Minimal endplate spurring at I2-L7 and C6-C7 with preservation of disc spaces. Upper chest: Dense right apical apical consolidation is partially included, slightly masslike in appearance measuring at least 2.3 cm. Other: Endotracheal and enteric tubes in place. IMPRESSION: 1. No acute intracranial abnormality. Incidental note  of right middle cranial fossa arachnoid cyst and tiny quadrigeminal lipoma, typically of no clinical significance. 2. No acute fracture or subluxation of the cervical spine. 3. Small vertebral body lucencies within multiple vertebral bodies are nonspecific. This may reflect focal osteopenia or hemangiomas, however metastasis or multiple myeloma could have a similar appearance, which would be unusual given patient's age. MRI could be considered for further evaluation. 4. Right apical consolidation has a slightly mass like appearance, only partially included in the field of view. Differential considerations include pneumonia, aspiration or less likely neoplasm. This is not well seen on chest radiograph obtained today, consider chest CT characterization. Electronically Signed   By: Jeb Levering M.D.   On: 03/21/2017 22:48   Ct Chest Wo Contrast  Result Date: 03/22/2017 CLINICAL DATA:  Acute onset of seizure like activity. High blood pressure and hyperglycemia. EXAM: CT CHEST WITHOUT CONTRAST TECHNIQUE: Multidetector CT imaging of the chest was performed following the standard protocol without IV contrast. COMPARISON:  Chest radiograph performed 03/21/2017 FINDINGS: Cardiovascular: The heart is mildly enlarged. Minimal coronary artery calcification is seen. The great vessels are grossly unremarkable. The thoracic aorta is unremarkable in appearance. Mediastinum/Nodes: No definite mediastinal lymphadenopathy is seen. No pericardial effusion is identified. The visualized portions of the thyroid gland are unremarkable. No axillary lymphadenopathy is seen. The patient's endotracheal tube is seen ending 1-2 cm above the carina. Lungs/Pleura: Dense bilateral lower lobe airspace opacification is noted, with additional hazy opacity throughout both lungs. This is suspicious for bilateral pneumonia, though superimposed pulmonary edema is a concern. Aspiration pneumonia cannot  be excluded. No pleural effusion or  pneumothorax is seen. Upper Abdomen: The visualized portions of the liver and spleen are unremarkable. The visualized portions of the gallbladder, pancreas, adrenal glands and kidneys are within normal limits. The patient's enteric tube extends to the body of the stomach. Musculoskeletal: No acute osseous abnormalities are identified. The visualized musculature is unremarkable in appearance. IMPRESSION: 1. Dense bilateral lower lobe airspace opacification, with additional hazy opacity throughout both lungs. This is suspicious for bilateral pneumonia, though superimposed pulmonary edema is a concern. Aspiration pneumonia cannot be excluded, given the patient's presentation. 2. Mild cardiomegaly. Electronically Signed   By: Garald Balding M.D.   On: 03/22/2017 03:32   Ct Cervical Spine Wo Contrast  Result Date: 03/21/2017 CLINICAL DATA:  Seizure. Altered level of consciousness (LOC), unexplained; C-spine trauma, ligamentous injury suspected. EXAM: CT HEAD WITHOUT CONTRAST CT CERVICAL SPINE WITHOUT CONTRAST TECHNIQUE: Multidetector CT imaging of the head and cervical spine was performed following the standard protocol without intravenous contrast. Multiplanar CT image reconstructions of the cervical spine were also generated. COMPARISON:  None. FINDINGS: CT HEAD FINDINGS Brain: CSF density structure in the right middle cranial fossa measures approximately 3 cm and is consistent with an arachnoid cyst. A 10 mm curvilinear fat density structure in the quadrigeminal cistern is likely an incidental lipoma. No associated mass effect. No intracranial hemorrhage or evidence of ischemia. No hydrocephalus. No subdural or intracranial fluid collection. Vascular: No hyperdense vessel. Skull: No fracture or focal lesion. Sinuses/Orbits: Pharyngeal secretions likely secondary to intubation. Paranasal sinuses and mastoid air cells are clear. The visualized orbits are unremarkable. Other: None. CT CERVICAL SPINE FINDINGS  Alignment: Reversal of normal lordosis. Skull base and vertebrae: No acute fracture. Vertebral body heights are maintained. The dens and skull base are intact. There are small subcentimeter lucencies within C2, C3, C4 and T1 vertebral bodies. Largest measures 6 mm. Soft tissues and spinal canal: No prevertebral fluid or swelling. No visible canal hematoma. Disc levels: Minimal endplate spurring at J4-H7 and C6-C7 with preservation of disc spaces. Upper chest: Dense right apical apical consolidation is partially included, slightly masslike in appearance measuring at least 2.3 cm. Other: Endotracheal and enteric tubes in place. IMPRESSION: 1. No acute intracranial abnormality. Incidental note of right middle cranial fossa arachnoid cyst and tiny quadrigeminal lipoma, typically of no clinical significance. 2. No acute fracture or subluxation of the cervical spine. 3. Small vertebral body lucencies within multiple vertebral bodies are nonspecific. This may reflect focal osteopenia or hemangiomas, however metastasis or multiple myeloma could have a similar appearance, which would be unusual given patient's age. MRI could be considered for further evaluation. 4. Right apical consolidation has a slightly mass like appearance, only partially included in the field of view. Differential considerations include pneumonia, aspiration or less likely neoplasm. This is not well seen on chest radiograph obtained today, consider chest CT characterization. Electronically Signed   By: Jeb Levering M.D.   On: 03/21/2017 22:48   Mr Jodene Nam Head Wo Contrast  Result Date: 03/24/2017 CLINICAL DATA:  Cocaine use.  Bilateral white matter infarctions. EXAM: MRA HEAD WITHOUT CONTRAST TECHNIQUE: Angiographic images of the Circle of Willis were obtained using MRA technique without intravenous contrast. COMPARISON:  MRI 03/22/2017 FINDINGS: The examination suffers from motion degradation. Both internal carotid arteries appear widely patent  through the skullbase and siphon regions. The anterior and middle cerebral vessels are patent without definable stenosis. Small-vessel detail is not available because of motion. Both vertebral arteries are patent with  the left being dominant. No basilar stenosis. Posterior circulation branch vessels are patent. Again, distal vessel detail is poor. IMPRESSION: No large vessel occlusion or correctable proximal stenosis. Distal vessel detail is poor/ not reliable because of motion degradation. Electronically Signed   By: Nelson Chimes M.D.   On: 03/24/2017 11:38   Mr Brain Wo Contrast  Result Date: 03/22/2017 CLINICAL DATA:  Seizures.  Hypertensive. EXAM: MRI HEAD WITHOUT CONTRAST TECHNIQUE: Multiplanar, multiecho pulse sequences of the brain and surrounding structures were obtained without intravenous contrast. COMPARISON:  Head CT 03/21/2017 FINDINGS: Brain: There are multiple punctate foci of restricted diffusion scattered throughout the white matter of both cerebral hemispheres consistent with acute infarcts, many of which are along internal border zones. No intracranial hemorrhage, mass, midline shift, or extra-axial fluid collection is identified. The ventricles are normal. No cortical or deep gray nuclei ischemia is seen. The brainstem and cerebellum are unremarkable. A 2.5 x 2.4 cm extra-axial CSF collection anterior to the right temporal lobe in the middle cranial fossa is consistent with an arachnoid cyst. A 9 mm curvilinear lipoma is noted in the quadrigeminal cistern. Dedicated temporal lobe imaging demonstrates asymmetry of the hippocampal bodies which is felt to reflect mass effect on the left from a 12 mm choroidal fissure cyst rather than left hippocampal atrophy and adjacent ex vacuo extra-axial CSF space enlargement. No hippocampal T2 signal abnormality is identified. Vascular: Major intracranial vascular flow voids are preserved. Skull and upper cervical spine: Unremarkable bone marrow signal.  Sinuses/Orbits: Unremarkable orbits. Trace bilateral mastoid fluid. Trace right sphenoid sinus fluid. Other: Partially visualized endotracheal and enteric tubes with retained secretions in the pharynx. IMPRESSION: 1. Scattered punctate acute white matter infarcts in both cerebral hemispheres which may reflect watershed ischemia. 2. 2.5 cm right middle cranial fossa arachnoid cyst. 3. 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 These are typically incidental though rarely can be a cause of seizures. Electronically Signed   By: Logan Bores M.D.   On: 03/22/2017 17:02   US Renal  Result Date: 03/22/2017 CLINICAL DATA:  Acute renal failure EXAM: RENAL / URINARY TRACT ULTRASOUND COMPLETE COMPARISON:  None. FINDINGS: Right Kidney: Length: 12.6 cm. Echogenicity within normal limits. No mass or hydronephrosis visualized. Left Kidney: Length: 11.7 cm. Echogenicity within normal limits. No mass or hydronephrosis visualized. Bladder: Decompressed by Foley catheter. IMPRESSION: No acute abnormality noted. Electronically Signed   By: Inez Catalina M.D.   On: 03/22/2017 07:09   Dg Chest Port 1 View  Result Date: 03/25/2017 CLINICAL DATA:  Pneumonia seizure EXAM: PORTABLE CHEST 1 VIEW COMPARISON:  03/24/2017 FINDINGS: Cardiac enlargement. Vascular congestion and mild edema has improved. Bibasilar atelectasis has improved. No effusion. IMPRESSION: Improving bilateral airspace disease most consistent with improving heart failure. Mild bibasilar atelectasis also improved. Electronically Signed   By: Franchot Gallo M.D.   On: 03/25/2017 07:39   Dg Chest Port 1 View  Result Date: 03/24/2017 CLINICAL DATA:  Respiratory failure.  Seizure. EXAM: PORTABLE CHEST 1 VIEW COMPARISON:  03/23/2017 FINDINGS: Endotracheal and enteric tubes have been removed. The cardiac silhouette remains enlarged. Patchy airspace opacities in the perihilar and basilar regions bilaterally demonstrate at most  minimal mixed interval changes. No sizable pleural effusion or pneumothorax is identified. IMPRESSION: Interval extubation. Similar appearance of bilateral airspace disease. Electronically Signed   By: Logan Bores M.D.   On: 03/24/2017 09:24   Dg Chest Port 1 View  Result Date: 03/23/2017 CLINICAL DATA:  Initial  evaluation for endotracheal to, seizure. EXAM: PORTABLE CHEST 1 VIEW COMPARISON:  Prior CT from 03/22/2017. FINDINGS: Endotracheal tube in place with tip position 2.1 cm above the carina. Enteric tube courses in the the abdomen. Mild cardiomegaly. Mediastinal silhouette normal. Lungs hypoinflated. Persistent bibasilar opacities, which may reflect pneumonia or possibly aspiration pneumonitis. Air bronchograms at the left lung base. No pulmonary edema. No appreciable pleural effusion. No pneumothorax. Osseous structures unchanged. IMPRESSION: 1. Tip of the endotracheal tube positioned 2.1 cm above the carina. 2. Low lung volumes with persistent patchy bibasilar opacities. Multifocal infiltrates or sequelae of aspiration would be the primary considerations. Electronically Signed   By: Jeannine Boga M.D.   On: 03/23/2017 06:54   Dg Chest Portable 1 View  Result Date: 03/21/2017 CLINICAL DATA:  Endotracheal tube placement. EXAM: PORTABLE CHEST 1 VIEW COMPARISON:  None. FINDINGS: The patient's endotracheal tube is seen ending 2-3 cm above the carina. An enteric tube is noted extending below the diaphragm. A small right pleural effusion is noted. Hazy bilateral airspace opacities, right greater than left, raise concern for pulmonary edema. No pneumothorax is seen. The cardiomediastinal silhouette is borderline normal in size. No acute osseous abnormalities are identified. IMPRESSION: 1. Endotracheal tube seen ending 2-3 cm above the carina. 2. Small right pleural effusion. Hazy bilateral airspace opacities, right greater than left, raise concern for pulmonary edema. Electronically Signed   By:  Garald Balding M.D.   On: 03/21/2017 21:45    Microbiology: Recent Results (from the past 240 hour(s))  Culture, blood (routine x 2)     Status: None (Preliminary result)   Collection Time: 03/21/17 11:29 PM  Result Value Ref Range Status   Specimen Description BLOOD RIGHT FOREARM  Final   Special Requests   Final    BOTTLES DRAWN AEROBIC AND ANAEROBIC Blood Culture adequate volume   Culture NO GROWTH 4 DAYS  Final   Report Status PENDING  Incomplete  Culture, blood (routine x 2)     Status: None (Preliminary result)   Collection Time: 03/22/17 12:29 AM  Result Value Ref Range Status   Specimen Description BLOOD RIGHT HAND  Final   Special Requests IN PEDIATRIC BOTTLE Blood Culture adequate volume  Final   Culture NO GROWTH 4 DAYS  Final   Report Status PENDING  Incomplete  MRSA PCR Screening     Status: None   Collection Time: 03/22/17  3:33 AM  Result Value Ref Range Status   MRSA by PCR NEGATIVE NEGATIVE Final    Comment:        The GeneXpert MRSA Assay (FDA approved for NASAL specimens only), is one component of a comprehensive MRSA colonization surveillance program. It is not intended to diagnose MRSA infection nor to guide or monitor treatment for MRSA infections.   Culture, respiratory (NON-Expectorated)     Status: None   Collection Time: 03/22/17  4:19 AM  Result Value Ref Range Status   Specimen Description TRACHEAL ASPIRATE  Final   Special Requests NONE  Final   Gram Stain   Final    ABUNDANT WBC PRESENT, PREDOMINANTLY PMN FEW GRAM POSITIVE COCCI    Culture   Final    MODERATE GROUP B STREP(S.AGALACTIAE)ISOLATED FEW STAPHYLOCOCCUS AUREUS TESTING AGAINST S. AGALACTIAE NOT ROUTINELY PERFORMED DUE TO PREDICTABILITY OF AMP/PEN/VAN SUSCEPTIBILITY. FOR ORGANISM 1    Report Status 03/25/2017 FINAL  Final   Organism ID, Bacteria STAPHYLOCOCCUS AUREUS  Final      Susceptibility   Staphylococcus aureus - MIC*  CIPROFLOXACIN <=0.5 SENSITIVE Sensitive      ERYTHROMYCIN RESISTANT Resistant     GENTAMICIN <=0.5 SENSITIVE Sensitive     OXACILLIN 0.5 SENSITIVE Sensitive     TETRACYCLINE <=1 SENSITIVE Sensitive     VANCOMYCIN <=0.5 SENSITIVE Sensitive     TRIMETH/SULFA <=10 SENSITIVE Sensitive     CLINDAMYCIN RESISTANT Resistant     RIFAMPIN <=0.5 SENSITIVE Sensitive     Inducible Clindamycin POSITIVE Resistant     * FEW STAPHYLOCOCCUS AUREUS     Labs: Basic Metabolic Panel: Recent Labs  Lab 03/22/17 0357  03/22/17 1821 03/23/17 0337 03/23/17 1107 03/24/17 0411 03/25/17 0222 03/26/17 0512  NA 139   < > 136 137 139 140 138 139  K 3.4*   < > 4.1 3.6 4.0 4.3 2.9* 3.4*  CL 105   < > 104 105 107 109 102 104  CO2 23   < > '24 24 24 22 25 27  '$ GLUCOSE 190*   < > 172* 121* 145* 111* 158* 205*  BUN 11   < > 20 19 22* 23* 16 14  CREATININE 2.42*   < > 2.70* 2.60* 2.50* 2.23* 1.51* 1.30*  CALCIUM 8.4*   < > 8.0* 8.3* 8.1* 8.3* 8.5* 8.5*  MG 1.9  --  2.4 2.4  --   --   --   --   PHOS <1.0*  --  4.4 3.9  --   --   --   --    < > = values in this interval not displayed.   Liver Function Tests: Recent Labs  Lab 03/21/17 2024 03/24/17 0411  AST 35 25  ALT 28 14*  ALKPHOS 137* 54  BILITOT 0.7 1.5*  PROT 7.7 5.5*  ALBUMIN 4.2 2.5*   No results for input(s): LIPASE, AMYLASE in the last 168 hours. No results for input(s): AMMONIA in the last 168 hours. CBC: Recent Labs  Lab 03/21/17 2024 03/21/17 2148 03/22/17 0357 03/23/17 0337 03/24/17 0411 03/25/17 0222 03/26/17 0512  WBC 9.3 25.0* 13.4* 10.6* 10.4 10.7* 8.4  NEUTROABS 5.1 23.0*  --   --   --   --   --   HGB 15.5 15.9 13.7 11.6* 12.3* 12.3* 12.1*  HCT 45.7 46.6 40.7 35.8* 38.9* 36.6* 37.3*  MCV 76.0* 75.6* 74.8* 76.2* 80.9 78.0 77.5*  PLT 232 233 242 176 173 192 229   Cardiac Enzymes: Recent Labs  Lab 03/21/17 2230 03/22/17 0357 03/22/17 0801 03/22/17 1821  CKTOTAL  --   --   --  445*  TROPONINI 0.07* 0.36* 0.36*  --    BNP: BNP (last 3 results) Recent Labs     03/22/17 0357  BNP 331.4*    ProBNP (last 3 results) No results for input(s): PROBNP in the last 8760 hours.  CBG: Recent Labs  Lab 03/25/17 1957 03/25/17 2334 03/26/17 0453 03/26/17 0735 03/26/17 1109  GLUCAP 154* 153* 188* 136* 186*       Signed:  Tyneshia Stivers  [facility] 03/26/2017, 3:09 PM

## 2017-03-27 ENCOUNTER — Encounter (HOSPITAL_COMMUNITY): Payer: Self-pay | Admitting: Emergency Medicine

## 2017-03-27 LAB — CATECHOLAMINES, FRACTIONATED, PLASMA
Dopamine: <30 pg/mL (ref 0–48)
EPINEPHRINE: 51 pg/mL (ref 0–62)
NOREPINEPHRINE: 185 pg/mL (ref 0–874)

## 2017-03-27 LAB — CULTURE, BLOOD (ROUTINE X 2)
CULTURE: NO GROWTH
Culture: NO GROWTH
Special Requests: ADEQUATE
Special Requests: ADEQUATE

## 2017-03-29 LAB — METANEPHRINES, PLASMA
Metanephrine, Free: 51 pg/mL (ref 0–62)
NORMETANEPHRINE FREE: 67 pg/mL (ref 0–145)

## 2017-03-30 LAB — ALDOSTERONE + RENIN ACTIVITY W/ RATIO: Aldosterone: 13.9 ng/dL (ref 0.0–30.0)

## 2017-04-02 ENCOUNTER — Ambulatory Visit (INDEPENDENT_AMBULATORY_CARE_PROVIDER_SITE_OTHER): Payer: Self-pay | Admitting: Family Medicine

## 2017-04-02 ENCOUNTER — Encounter: Payer: Self-pay | Admitting: Family Medicine

## 2017-04-02 VITALS — BP 146/96 | HR 79 | Temp 98.4°F | Resp 14 | Ht 69.0 in | Wt 218.0 lb

## 2017-04-02 DIAGNOSIS — F1911 Other psychoactive substance abuse, in remission: Secondary | ICD-10-CM

## 2017-04-02 DIAGNOSIS — E1165 Type 2 diabetes mellitus with hyperglycemia: Secondary | ICD-10-CM

## 2017-04-02 DIAGNOSIS — N183 Chronic kidney disease, stage 3 unspecified: Secondary | ICD-10-CM

## 2017-04-02 DIAGNOSIS — Z87898 Personal history of other specified conditions: Secondary | ICD-10-CM

## 2017-04-02 DIAGNOSIS — Z8673 Personal history of transient ischemic attack (TIA), and cerebral infarction without residual deficits: Secondary | ICD-10-CM

## 2017-04-02 DIAGNOSIS — I10 Essential (primary) hypertension: Secondary | ICD-10-CM

## 2017-04-02 LAB — POCT URINALYSIS DIP (DEVICE)
BILIRUBIN URINE: NEGATIVE
GLUCOSE, UA: NEGATIVE mg/dL
Hgb urine dipstick: NEGATIVE
KETONES UR: NEGATIVE mg/dL
NITRITE: NEGATIVE
Protein, ur: 30 mg/dL — AB
Specific Gravity, Urine: 1.025 (ref 1.005–1.030)
Urobilinogen, UA: 0.2 mg/dL (ref 0.0–1.0)
pH: 5.5 (ref 5.0–8.0)

## 2017-04-02 MED ORDER — LANCETS MISC: 1.0000 | Freq: Three times a day (TID) | 5 refills | Status: DC

## 2017-04-02 MED ORDER — TRUE METRIX AIR GLUCOSE METER DEVI: 1.0000 | Freq: Three times a day (TID) | 0 refills | Status: DC

## 2017-04-02 MED ORDER — GLUCOSE BLOOD VI STRP: ORAL_STRIP | 12 refills | Status: DC

## 2017-04-02 MED ORDER — INSULIN GLARGINE 100 UNIT/ML SOLOSTAR PEN: 20.0000 [IU] | PEN_INJECTOR | Freq: Every day | SUBCUTANEOUS | 11 refills | Status: DC

## 2017-04-02 MED ORDER — "INSULIN SYRINGE 29G X 1/2"" 0.5 ML MISC": 1.0000 | Freq: Every day | 5 refills | Status: AC

## 2017-04-02 MED ORDER — HYDROCHLOROTHIAZIDE 25 MG PO TABS: 25.0000 mg | ORAL_TABLET | Freq: Every day | ORAL | 5 refills | Status: DC

## 2017-04-02 MED FILL — TRUEplus LANCETS 28G MISC: 25 days supply | Qty: 100 | Fill #0

## 2017-04-02 MED FILL — TRUE METRIX TEST STRIP: 25 days supply | Qty: 100 | Fill #0

## 2017-04-02 MED FILL — HYDROCHLOROTHIAZIDE 25 MG T: 25 | 30 days supply | Qty: 30 | Fill #0

## 2017-04-02 MED FILL — !TRUE METRIX BLOOD GLUCOSE: 1 days supply | Qty: 1 | Fill #0

## 2017-04-02 MED FILL — !LANTUS SOLOSTAR 100UNITS/M: 100 | 30 days supply | Qty: 6 | Fill #0

## 2017-04-02 NOTE — Patient Instructions (Addendum)
Patient presented to establish care on 04/02/2017. He has a new diagnosis of type 2 diabetes mellitus.  Discussed hemoglobin A1c at length.  Current hemoglobin A1c is 11.2, last taken 8 days ago while admitted to inpatient services.  Patient was started on 500 mg of metformin twice daily with meals.  We will continue metformin as previously prescribed.  We will add Lantus 20 units at bedtime.  Will check blood sugars prior to meals.  Blood sugars will be checked a total of 4 times per day.  Bring your glucometer to your next appointment.  Recommend that you continue carbohydrate modify diet divided over 5-6 small meals throughout the day.  Increase water intake to 5-6 glasses/day.  Recommend that she gradually increase her cardiovascular activity. Refrain from any activities that will lower your seizure threshold.  Those activities include illicit drug or alcohol use.  Other activities include nonapproved over-the-counter supplements or over-the-counter medications.  For seizure disorder you are to follow-up with neurology in 6 weeks as scheduled.  We will order a chest x-ray prior to your next appointment to ensure that bilateral pneumonia has resolved.  [facility]ray department.  Complete financial assistance forms as discussed.  He will submit oriented cart forms to community health and wellness.    [facility] [address] [place] [phone]

## 2017-04-03 LAB — CMP AND LIVER
ALT: 27 IU/L (ref 0–44)
AST: 26 IU/L (ref 0–40)
Albumin: 4.1 g/dL (ref 3.5–5.5)
Alkaline Phosphatase: 61 IU/L (ref 39–117)
BUN: 18 mg/dL (ref 6–20)
Bilirubin Total: 0.4 mg/dL (ref 0.0–1.2)
Bilirubin, Direct: 0.18 mg/dL (ref 0.00–0.40)
CALCIUM: 10 mg/dL (ref 8.7–10.2)
CO2: 25 mmol/L (ref 20–29)
Chloride: 101 mmol/L (ref 96–106)
Creatinine, Ser: 1.32 mg/dL — ABNORMAL HIGH (ref 0.76–1.27)
GFR, EST AFRICAN AMERICAN: 79 mL/min/{1.73_m2} (ref >59)
GFR, EST NON AFRICAN AMERICAN: 68 mL/min/{1.73_m2} (ref >59)
GLUCOSE: 103 mg/dL — AB (ref 65–99)
Potassium: 4.4 mmol/L (ref 3.5–5.2)
Sodium: 141 mmol/L (ref 134–144)
Total Protein: 7.1 g/dL (ref 6.0–8.5)

## 2017-04-05 ENCOUNTER — Encounter: Payer: Self-pay | Admitting: Family Medicine

## 2017-04-05 DIAGNOSIS — F1911 Other psychoactive substance abuse, in remission: Secondary | ICD-10-CM | POA: Insufficient documentation

## 2017-04-05 NOTE — Progress Notes (Signed)
Subjective:    Patient ID: Micheal Levy, male    DOB: 1980/05/16, 36 y.o.   MRN: [id number]  HPI  Micheal Levy, a 36 year old male presents accompanied by friend to establish care. Patient has not had a primary provider due to insurance constraints. Patient has primarily been using the emergency department for all medical needs.   Patient was recently admitted to inpatient services from 03/21/2017-03/26/2017 for a new diagnosis of type 2 diabetes mellitus, seizure and hypertensive urgency. Patient presented to the ER via EMS after having 2 witnessed seizures. Patient was hypertensive at 239/134, glucose 773,  and was positive for cocaine. Seizure activity was thought to be secondary to hyperglycemia, chronic kidney disease, and hypertensive urgency.  Patient underwent an EEG, which showed that he did not have seizure activity. Patient was discharged on Keppra twice daily.   Patient las has a history of acute CVA. MRI showed punctate white matter infarcts in both cerebral hemispheres, consistent with ischemia. Patient was discharged home on aspirin and is scheduled to follow up with Dr. Leta Baptist, neurology on 05/26/2017.   Patient was diagnosed with type 2 diabetes mellitus. Glucose was 773 on admission. Patient also had an a1C of 11.2 at that time. Patient was discharged home with Metformin 500 mg twice daily. He met with diabetes coordinator and has started a carbohydrate modified diet. Patient endorses neuropathy. He denies polyuria, polydipsia, or polyphagia. Patient was found to have acute kidney injury on admission. AKI markedly improved by hospital discharge.   Patient has a history of hypertensive urgency. Patient has been following a low sodium diet. He does not check blood pressure at home. He has been taking all antihypertensive medications consistently. He denies dizziness, chest pain, shortness of breath, nausea, syncope, or bilateral lower extremity edema.   Past Medical History:   Diagnosis Date  . Chronic kidney disease   . Diabetes mellitus without complication ([place])   . Hypertension   . Stroke ([place])   . Substance abuse ([place])    Social History   Socioeconomic History  . Marital status: Single    Spouse name: Not on file  . Number of children: Not on file  . Years of education: Not on file  . Highest education level: Not on file  Social Needs  . Financial resource strain: Not on file  . Food insecurity - worry: Not on file  . Food insecurity - inability: Not on file  . Transportation needs - medical: Not on file  . Transportation needs - non-medical: Not on file  Occupational History  . Not on file  Tobacco Use  . Smoking status: Former Smoker    Types: Cigarettes  . Smokeless tobacco: Never Used  . Tobacco comment: 2001  Substance and Sexual Activity  . Alcohol use: Yes    Comment: occ  . Drug use: Yes    Types: Cocaine, Marijuana    Comment: weed-daily. cocaine- once or twice a month   . Sexual activity: Yes  Other Topics Concern  . Not on file  Social History Narrative   ** Merged History Encounter **       Immunization History  Administered Date(s) Administered  . Influenza,inj,Quad PF,6+ Mos 03/23/2017  . Pneumococcal Polysaccharide-23 03/23/2017   Diabetic Foot Exam - Simple   Simple Foot Form Diabetic Foot exam was performed with the following findings:  Yes 04/02/2017 10:40 AM  Visual Inspection No deformities, no ulcerations, no other skin breakdown bilaterally:  Yes Sensation Testing Intact  to touch and monofilament testing bilaterally:  Yes Pulse Check Posterior Tibialis and Dorsalis pulse intact bilaterally:  Yes Comments    Review of Systems  HENT: Negative.   Respiratory: Negative.   Cardiovascular: Negative.   Endocrine: Negative for polydipsia, polyphagia and polyuria.  Genitourinary: Negative.   Allergic/Immunologic: Negative for immunocompromised state.  Neurological: Positive for weakness and numbness.  Negative for dizziness and light-headedness.  Hematological: Negative.       Objective:   Physical Exam  Constitutional: He is oriented to person, place, and time. He appears well-developed and well-nourished.  HENT:  Head: Normocephalic and atraumatic.  Right Ear: External ear normal.  Left Ear: External ear normal.  Nose: Nose normal.  Mouth/Throat: Oropharynx is clear and moist.  Eyes: Conjunctivae and EOM are normal. Pupils are equal, round, and reactive to light.  Neck: Normal range of motion. Neck supple.  Cardiovascular: Normal rate, regular rhythm, normal heart sounds and intact distal pulses.  Abdominal: Soft. Bowel sounds are normal.  Musculoskeletal: Normal range of motion.  Neurological: He is alert and oriented to person, place, and time. He has normal strength and normal reflexes. Coordination and gait normal.  Skin: Skin is warm and dry.  Psychiatric: He has a normal mood and affect. His behavior is normal. Judgment and thought content normal.  BP (!) 146/96 (BP Location: Left Arm, Patient Position: Sitting, Cuff Size: Large) Comment: manually  Pulse 79   Temp 98.4 F (36.9 C) (Oral)   Resp 14   Ht _0  (1.753 m)   Wt 218 lb (98.9 kg)   SpO2 100%   BMI 32.19 kg/m  Assessment & Plan:  1. Essential hypertension Will continue blood pressure medications as prescribed. Reviewed urinalysis, proteinuria present. Will add on urine microalbumin Will return in 1 week for bp check - Continue medication, monitor blood pressure at home. Continue DASH diet. Reminder to go to the ER if any CP, SOB, nausea, dizziness, severe HA, changes vision/speech, left arm numbness and tingling and jaw pain.    - hydrochlorothiazide (HYDRODIURIL) 25 MG tablet; Take 1 tablet (25 mg total) by mouth daily.  Dispense: 30 tablet; Refill: 5 - CMP and Liver - POCT urinalysis dip (device) - Microalbumin/Creatinine Ratio, Urine  2. Uncontrolled type 2 diabetes mellitus with hyperglycemia  (HCC) Hemoglobin a1C is 11.2, which is above goal. Goal is < 7.  Will start basal insulin. Lantus 20 units at bedtime.  Check blood sugars prior to meals and at bedtime. Bring glucometer to scheduled follow up appointment.  Will also continue Metformin 500 mg twice daily - Insulin Glargine (LANTUS SOLOSTAR) 100 UNIT/ML Solostar Pen; Inject 20 Units into the skin daily at 10 pm.  Dispense: 5 pen; Refill: 11 - Blood Glucose Monitoring Suppl (TRUE METRIX AIR GLUCOSE METER) DEVI; 1 each by Does not apply route 4 (four) times daily -  before meals and at bedtime.  Dispense: 1 Device; Refill: 0 - glucose blood (TRUE METRIX BLOOD GLUCOSE TEST) test strip; Use as instructed  Dispense: 100 each; Refill: 12 - Lancets MISC; 1 each by Does not apply route 4 (four) times daily -  before meals and at bedtime.  Dispense: 100 each; Refill: 5 - INSULIN SYRINGE .5CC/29G (B-D INS SYR ULTRAFINE .5CC/29G) 29G X 1/2" 0.5 ML MISC; 1 each by Does not apply route at bedtime.  Dispense: 100 each; Refill: 5 - Microalbumin/Creatinine Ratio, Urine  3. Stage 3 chronic kidney disease ([place]) Will review creatinine level as results become available.  Patient  is awaiting payer source, will send a referral to nephrology when it becomes available.   4. History of CVA (cerebrovascular accident) Will continue ASA therapy.  Reviewed lipid panel, will start a low dose of statin therapy Patient is scheduled to follow up with neurology on 05/25/2017.  - CMP and Liver  5. History of substance abuse Patient says that he has not used illicit drugs since hospital admission. Given information to alcohol and drug treatment programs. Patient says that he is not interested at this time.   [facility] [place] [phone]  RTC: Follow up in 1 month for type 2 diabetes mellitus  Donia Pounds  MSN, FNP-C Patient [place] [address] [place], [place]  [phone]

## 2017-04-08 ENCOUNTER — Telehealth: Payer: Self-pay

## 2017-04-08 NOTE — Telephone Encounter (Signed)
-----   Message from [name], [place] sent at 04/04/2017  8:45 AM EST ----- Regarding: lab results Please inform patient that creatinine was mildly elevated, which is consistent with decreased kidney functioning. Will work at lowering hemoglobin a1C related to uncontrolled diabetes and decreasing blood pressure to alleviate this problem. Please discuss the importance of taking prescribed medications consistently and adhering to appointment schedule.   Thanks

## 2017-04-08 NOTE — Telephone Encounter (Signed)
Called and spoke with patient. Advised of creatinine being mildly elevated and that this can be due to diabetes and blood pressure being poorly controlled. Asked that patient take medications consistently and keep next scheduled appointment. Patient verbalized understanding. Thanks!

## 2017-04-10 ENCOUNTER — Other Ambulatory Visit: Payer: Self-pay

## 2017-04-10 NOTE — Patient Outreach (Signed)
[facility] Care Management  04/10/2017  Micheal Levy [birth year] [id number]     EMMI-STROKE RED ON EMMI ALERT Day # 13 Date: 04/09/17 Red Alert Reason: " Problems refilling medications? Yes"   Outreach attempt # 1 to patient.  No answer at present and unable to leave voicemail message as mailbox full.       Plan: RN CM will make outreach attempt to patient within one business day if no return call from patient.    Micheal Fairyoshanda Alora Gorey, RN,BSN,CCM [facility] Care Management Telephonic Care Management Coordinator Direct Phone: [phone] Toll Free: [phone] Fax: [phone]

## 2017-04-13 ENCOUNTER — Other Ambulatory Visit: Payer: Self-pay

## 2017-04-13 ENCOUNTER — Telehealth: Payer: Self-pay

## 2017-04-13 ENCOUNTER — Encounter: Payer: Self-pay | Admitting: Family Medicine

## 2017-04-13 MED ORDER — PEN NEEDLES 31G X 6 MM MISC: 1.0000 | Freq: Every day | 12 refills | Status: DC

## 2017-04-13 MED FILL — TRUEPLUS PEN NDL 31G X 1/4: 31G X 6 MM | 25 days supply | Qty: 100 | Fill #0

## 2017-04-13 MED FILL — TRUEPLUS PEN NDL 31G X 1/4": 31G X 6 MM | 25 days supply | Qty: 100 | Fill #0

## 2017-04-13 NOTE — Telephone Encounter (Signed)
Spoke with patient informed of note for work being prepared and that he can come and pick that up . Also advised that he will need an appointment to discuss the ED. Thanks!

## 2017-04-13 NOTE — Patient Outreach (Signed)
[facility]) Care Management  04/13/2017  Micheal Levy 17-Aug-1980 [id number]   EMMI-STROKE RED ON EMMI ALERT Day # 13 Date: 04/09/17 Red Alert Reason: " Problems refilling medications? Yes"   Outreach attempt #2 to patient. Spoke with patient. He voices he is doing well since return home. RN CM reviewed and addressed red alert with patient. He denies responding to automated call that way. He voices that he has all his meds and knows when and how to take them. He has transportation to appts. He completed PCP f/u appt on 04/02/17. He has support sytem available to help him if needed. He is aware of s/s of worsening condition and when to seek medical attention. He voices he will be calling PCP today because he has a a slight cold and wants to know what MD recommends for him to take OTC given his HTN and DM. Otherwise, he is doing fine. No RN CM needs or concerns voiced at this time. Patient has completed automated post discharge EMMI-Stroke calls.     Plan: RN CM will notify [facility] administrative assistant of case status.   Antionette Fairyoshanda Argus Caraher, RN,BSN,CCM [facility] Care Management Telephonic Care Management Coordinator Direct Phone: [phone] Toll Free: [phone] Fax: [phone]

## 2017-04-13 NOTE — Telephone Encounter (Signed)
-----   Message from [name], [place] sent at 04/13/2017  1:28 PM EST ----- Regarding: RE: work note/ something for ED  He will need to schedule an appt for ED.   Thanks ----- Message ----- From: Loney HeringBatten, Jaunice Mirza E, LPN Sent: 1/6/10961/10/2017   1:21 PM To: [name], FNP Subject: work note/ something for ED                    Patient called and is asking for a note saying it is ok for patient to return to  work with no restrictions. Also he said he is experiencing some ED and you had told him to let you know if this happened. Please advise if there is something he can do for this. Thanks!

## 2017-05-04 ENCOUNTER — Encounter: Payer: Self-pay | Admitting: Family Medicine

## 2017-05-04 ENCOUNTER — Ambulatory Visit (INDEPENDENT_AMBULATORY_CARE_PROVIDER_SITE_OTHER): Payer: Self-pay | Admitting: Family Medicine

## 2017-05-04 VITALS — BP 162/114 | HR 83 | Temp 98.4°F | Resp 16 | Ht 69.0 in | Wt 213.0 lb

## 2017-05-04 DIAGNOSIS — F1290 Cannabis use, unspecified, uncomplicated: Secondary | ICD-10-CM

## 2017-05-04 DIAGNOSIS — Z23 Encounter for immunization: Secondary | ICD-10-CM

## 2017-05-04 DIAGNOSIS — I10 Essential (primary) hypertension: Secondary | ICD-10-CM

## 2017-05-04 DIAGNOSIS — E119 Type 2 diabetes mellitus without complications: Secondary | ICD-10-CM

## 2017-05-04 DIAGNOSIS — Z87898 Personal history of other specified conditions: Secondary | ICD-10-CM

## 2017-05-04 LAB — POCT URINALYSIS DIP (DEVICE)
Bilirubin Urine: NEGATIVE
Glucose, UA: NEGATIVE mg/dL
Ketones, ur: NEGATIVE mg/dL
NITRITE: NEGATIVE
PH: 7 (ref 5.0–8.0)
PROTEIN: 100 mg/dL — AB
Specific Gravity, Urine: 1.015 (ref 1.005–1.030)
Urobilinogen, UA: 0.2 mg/dL (ref 0.0–1.0)

## 2017-05-04 LAB — POCT GLYCOSYLATED HEMOGLOBIN (HGB A1C): HEMOGLOBIN A1C: 7.8

## 2017-05-04 LAB — GLUCOSE, CAPILLARY: GLUCOSE-CAPILLARY: 93 mg/dL (ref 65–99)

## 2017-05-04 MED ORDER — CLONIDINE HCL 0.1 MG PO TABS
0.2000 mg | ORAL_TABLET | Freq: Once | ORAL | Status: AC
  Administered 2017-05-04: 0.2 mg via ORAL

## 2017-05-04 MED ORDER — HYDRALAZINE HCL 50 MG PO TABS: 50.0000 mg | ORAL_TABLET | Freq: Three times a day (TID) | ORAL | 5 refills | Status: DC

## 2017-05-04 MED ORDER — LEVETIRACETAM 500 MG PO TABS: 500.0000 mg | ORAL_TABLET | Freq: Two times a day (BID) | ORAL | 5 refills | Status: DC

## 2017-05-04 MED ORDER — METFORMIN HCL 500 MG PO TABS: 500.0000 mg | ORAL_TABLET | Freq: Two times a day (BID) | ORAL | 5 refills | Status: DC

## 2017-05-04 MED ORDER — CLONIDINE HCL 0.1 MG PO TABS
0.1000 mg | ORAL_TABLET | Freq: Once | ORAL | Status: AC
  Administered 2017-05-04: 0.1 mg via ORAL

## 2017-05-04 MED ORDER — AMLODIPINE BESYLATE 10 MG PO TABS: 10.0000 mg | ORAL_TABLET | Freq: Every day | ORAL | 5 refills | Status: DC

## 2017-05-04 MED ORDER — HYDROCHLOROTHIAZIDE 25 MG PO TABS: 25.0000 mg | ORAL_TABLET | Freq: Every day | ORAL | 5 refills | Status: DC

## 2017-05-04 MED FILL — AMLODIPINE BESYLATE 10 MG T: 10 | 30 days supply | Qty: 30 | Fill #0

## 2017-05-04 MED FILL — ?HYDRALAZINE 50MG TABLET: 50 | 30 days supply | Qty: 90 | Fill #0

## 2017-05-04 MED FILL — ?METFORMIN HCL 500MG TABLET: 500 | 30 days supply | Qty: 60 | Fill #0

## 2017-05-04 MED FILL — HYDROCHLOROTHIAZIDE 25 MG T: 25 | 30 days supply | Qty: 30 | Fill #0

## 2017-05-04 MED FILL — levETIRAcetam 500 MG TABS: 500 | 30 days supply | Qty: 60 | Fill #0

## 2017-05-04 NOTE — Patient Instructions (Signed)
Your hemoglobin A1c has decreased from 11.2 to 7.8.  We will continue Lantus at 20 units/bedtime and metformin 500 mg twice daily with meals.  Also continue carbohydrate modify diet divided over 5-6 small meals throughout the day. Your blood pressure was markedly elevated on arrival.  Please continue to take blood pressure medications exactly as prescribed.  Please follow-up in 1 week for blood pressure check

## 2017-05-04 NOTE — Progress Notes (Signed)
Subjective:    Patient ID: Micheal Levy, male    DOB: 1980/09/07, 37 y.o.   MRN: [id number]  HPI  Micheal Levy, a 37 year old male presents accompanied by friend for a follow up of chronic conditions. Patient says that he ran out of medications 1 week ago and did not have refills at pharmacy. He did not call office to send in prescriptions. He says that he did not realize that he could call office. He says that he was taking all medications consistently prior to running out.  Blood pressure markedly elevated a 192/126 on arrival. Patient has a history of hypertensive urgency. Patient has not been following a low sodium diet. He does not check blood pressure at home. He denies dizziness, chest pain, shortness of breath, nausea, syncope, or bilateral lower extremity edema.   Patient was previously admitted to inpatient services from 03/21/2017-03/26/2017 for a new diagnosis of type 2 diabetes mellitus, seizure and hypertensive urgency. Patient presented to the ER via EMS after having 2 witnessed seizures. Patient was hypertensive at 239/134, glucose 773,  and was positive for cocaine. Seizure activity was thought to be secondary to hyperglycemia, chronic kidney disease, and hypertensive urgency.  Patient underwent an EEG, which showed that he did not have seizure activity. Patient was discharged on Keppra twice daily.   Patient las has a history of acute CVA. MRI showed punctate white matter infarcts in both cerebral hemispheres, consistent with ischemia. Patient was discharged home on aspirin and is scheduled to follow up with Dr. Marjory LiesPenumalli, neurology on 05/26/2017.   Patient has a history of type 2 diabetes mellitus. On 04/02/2017, his hemoglobin a1C was 11.2.  Patient was started on Lantus 20 units at bedtime and metformin was continued at 500 mg BID.  Patient has not started exercising routinely or following a carbohydrate modified diet. He denies polyuria, polydipsia, or polyphagia.   Past  Medical History:  Diagnosis Date  . Chronic kidney disease   . Diabetes mellitus without complication (HCC)   . Hypertension   . Stroke (HCC)   . Substance abuse (HCC)    Social History   Socioeconomic History  . Marital status: Single    Spouse name: Not on file  . Number of children: Not on file  . Years of education: Not on file  . Highest education level: Not on file  Social Needs  . Financial resource strain: Not on file  . Food insecurity - worry: Not on file  . Food insecurity - inability: Not on file  . Transportation needs - medical: Not on file  . Transportation needs - non-medical: Not on file  Occupational History  . Not on file  Tobacco Use  . Smoking status: Former Smoker    Types: Cigarettes  . Smokeless tobacco: Never Used  . Tobacco comment: 2001  Substance and Sexual Activity  . Alcohol use: Yes    Comment: occ  . Drug use: Yes    Types: Cocaine, Marijuana    Comment: weed-daily. cocaine- once or twice a month   . Sexual activity: Yes  Other Topics Concern  . Not on file  Social History Narrative   ** Merged History Encounter **       Immunization History  Administered Date(s) Administered  . Influenza,inj,Quad PF,6+ Mos 03/23/2017  . Pneumococcal Polysaccharide-23 03/23/2017   Diabetic Foot Exam - Simple   No data filed     Review of Systems  HENT: Negative.   Respiratory: Negative.  Cardiovascular: Negative.   Endocrine: Negative for polydipsia, polyphagia and polyuria.  Genitourinary: Negative.   Allergic/Immunologic: Negative for immunocompromised state.  Neurological: Positive for weakness and numbness. Negative for dizziness and light-headedness.  Hematological: Negative.       Objective:   Physical Exam  Constitutional: He is oriented to person, place, and time. He appears well-developed and well-nourished.  HENT:  Head: Normocephalic and atraumatic.  Right Ear: External ear normal.  Left Ear: External ear normal.  Nose:  Nose normal.  Mouth/Throat: Oropharynx is clear and moist.  Eyes: Conjunctivae and EOM are normal. Pupils are equal, round, and reactive to light.  Neck: Normal range of motion. Neck supple.  Cardiovascular: Normal rate, regular rhythm, normal heart sounds and intact distal pulses.  Abdominal: Soft. Bowel sounds are normal.  Musculoskeletal: Normal range of motion.  Neurological: He is alert and oriented to person, place, and time. He has normal strength and normal reflexes. Coordination and gait normal.  Skin: Skin is warm and dry.  Psychiatric: He has a normal mood and affect. His behavior is normal. Judgment and thought content normal.  BP (!) 192/126 (BP Location: Left Arm, Patient Position: Sitting, Cuff Size: Large) Comment: manually  Pulse 83   Temp 98.4 F (36.9 C) (Oral)   Resp 16   Ht 5\' 9"  (1.753 m)   Wt 213 lb (96.6 kg)   SpO2 100%   BMI 31.45 kg/m  Assessment & Plan:   1. Type 2 diabetes mellitus without complication, unspecified whether long term insulin use (HCC) Hemoglobin a1C has decreased to 7.8, will continue Lantus 20 units at bedtime and Metformin 500 mg BID.  Bring glucometer to follow up appointment   - HgB A1c - metFORMIN (GLUCOPHAGE) 500 MG tablet; Take 1 tablet (500 mg total) by mouth 2 (two) times daily with a meal.  Dispense: 60 tablet; Refill: 5 - Microalbumin/Creatinine Ratio, Urine - Basic Metabolic Panel  2. Essential hypertension Blood pressure markedly elevated on arrival. Administered Clonidine 0.3 mg of clonidine.   Recommend that patient start taking antihypertensive medications on today.  - Continue medication, monitor blood pressure at home. Continue DASH diet. Reminder to go to the ER if any CP, SOB, nausea, dizziness, severe HA, changes vision/speech, left arm numbness and tingling and jaw pain.  - amLODipine (NORVASC) 10 MG tablet; Take 1 tablet (10 mg total) by mouth daily.  Dispense: 30 tablet; Refill: 5 - hydrochlorothiazide  (HYDRODIURIL) 25 MG tablet; Take 1 tablet (25 mg total) by mouth daily.  Dispense: 30 tablet; Refill: 5 - hydrALAZINE (APRESOLINE) 50 MG tablet; Take 1 tablet (50 mg total) by mouth every 8 (eight) hours.  Dispense: 90 tablet; Refill: 5 - Orthostatic vital signs - Microalbumin/Creatinine Ratio, Urine - cloNIDine (CATAPRES) tablet 0.1 mg - Basic Metabolic Panel  3. History of seizure Patient has not had any seizure activity over the past month.  Will continue Keppra 500 mg BID He is scheduled to follow up with neurology on 05/26/2017  - levETIRAcetam (KEPPRA) 500 MG tablet; Take 1 tablet (500 mg total) by mouth 2 (two) times daily.  Dispense: 60 tablet; Refill: 5  4. Need for Tdap vaccination  - Tdap vaccine greater than or equal to 7yo IM  5. Marijuana use Patient says that he has not used illicit drugs since hospital admission. Given information to alcohol and drug treatment programs. Patient says that he is not interested at this time.   [facility] [address] [place] [phone]  RTC:  Follow up in 1 month for type 2 diabetes mellitus  Greater than 50% of visit spent face-to-face discussing current medication regimen.  Also, clonidine 0.3 mg administered during appointment, required frequent monitoring of blood pressure Nolon Nations  MSN, FNP-C Patient [facility] [address] [place], [place] [address] [phone]

## 2017-05-05 ENCOUNTER — Telehealth: Payer: Self-pay

## 2017-05-05 LAB — BASIC METABOLIC PANEL
BUN / CREAT RATIO: 12 (ref 9–20)
BUN: 16 mg/dL (ref 6–20)
CALCIUM: 9.6 mg/dL (ref 8.7–10.2)
CHLORIDE: 98 mmol/L (ref 96–106)
CO2: 26 mmol/L (ref 20–29)
CREATININE: 1.3 mg/dL — AB (ref 0.76–1.27)
GFR calc Af Amer: 80 mL/min/{1.73_m2} (ref >59)
GFR calc non Af Amer: 69 mL/min/{1.73_m2} (ref >59)
GLUCOSE: 97 mg/dL (ref 65–99)
Potassium: 3.4 mmol/L — ABNORMAL LOW (ref 3.5–5.2)
Sodium: 142 mmol/L (ref 134–144)

## 2017-05-05 LAB — MICROALBUMIN / CREATININE URINE RATIO
Creatinine, Urine: 191.3 mg/dL
MICROALB/CREAT RATIO: 121 mg/g{creat} — AB (ref 0.0–30.0)
Microalbumin, Urine: 231.4 ug/mL

## 2017-05-05 NOTE — Telephone Encounter (Signed)
-----   Message from [name], [place]NP sent at 05/05/2017 10:56 AM EST ----- Regarding: lab results Please inform patient that creatinine level is elevated, which is consistent with stage 3 CKD. It is important that he adhere to antihypertensive medication regimen. His hemoglobin a1C has decreased to 7.8, no medication changes are warranted. Continue lowfat, low carbohydrate diet.  Remind him of scheduled follow up appointment. .Micheal Levy

## 2017-05-05 NOTE — Telephone Encounter (Signed)
Called and spoke with patient, advised that creatinine level was elevated and is consistent with CKD. Advised that it is important that he take blood pressure medications as prescribed consistently. Informed him that hgba1c had improved to 7.8 so no medication changes are needed at this time but to continue to work on eating a low fat/ low carb diet. Asked that he keep next scheduled appointment. Patient verbalized understanding. Thanks!

## 2017-05-11 ENCOUNTER — Telehealth: Payer: Self-pay

## 2017-05-11 MED ORDER — METOPROLOL TARTRATE 25 MG PO TABS: 25.0000 mg | ORAL_TABLET | Freq: Three times a day (TID) | ORAL | 0 refills | Status: DC

## 2017-05-11 NOTE — Telephone Encounter (Signed)
Refill for metoprolol sent into pharmacy. Thanks!  

## 2017-05-12 ENCOUNTER — Ambulatory Visit: Payer: Self-pay

## 2017-05-12 DIAGNOSIS — I10 Essential (primary) hypertension: Secondary | ICD-10-CM

## 2017-05-12 MED ORDER — HYDRALAZINE HCL 100 MG PO TABS: 100.0000 mg | ORAL_TABLET | Freq: Three times a day (TID) | ORAL | 0 refills | Status: DC

## 2017-05-12 MED ORDER — HYDRALAZINE HCL 50 MG PO TABS: 100.0000 mg | ORAL_TABLET | Freq: Three times a day (TID) | ORAL | 5 refills | Status: DC

## 2017-05-12 MED ORDER — CLONIDINE HCL 0.1 MG PO TABS
0.1000 mg | ORAL_TABLET | Freq: Once | ORAL | Status: AC
  Administered 2017-05-12: 0.1 mg via ORAL

## 2017-05-12 NOTE — Progress Notes (Signed)
Increase hydralazine to 100 mg 3 times daily for uncontrolled blood pressure.  Patient advised to follow-up for blood pressure check in 1 week.  He is also advised to take medication consistently and when possible check blood pressure and notify the office of any readings greater than 150/90.

## 2017-05-18 ENCOUNTER — Ambulatory Visit (INDEPENDENT_AMBULATORY_CARE_PROVIDER_SITE_OTHER): Payer: Self-pay | Admitting: Family Medicine

## 2017-05-18 VITALS — BP 140/88

## 2017-05-18 DIAGNOSIS — I10 Essential (primary) hypertension: Secondary | ICD-10-CM

## 2017-05-18 NOTE — Progress Notes (Signed)
Blood Pressure was 140/88 manually today. Patient has taken all medications due by this time. He was advised to keep next scheduled appointment and continue taking all medications as directed. Thanks!

## 2017-05-25 ENCOUNTER — Ambulatory Visit: Payer: Self-pay | Admitting: Diagnostic Neuroimaging

## 2017-05-25 ENCOUNTER — Encounter: Payer: Self-pay | Admitting: Diagnostic Neuroimaging

## 2017-05-25 VITALS — BP 164/101 | HR 86 | Ht 69.0 in | Wt 214.0 lb

## 2017-05-25 DIAGNOSIS — R0681 Apnea, not elsewhere classified: Secondary | ICD-10-CM

## 2017-05-25 DIAGNOSIS — I639 Cerebral infarction, unspecified: Secondary | ICD-10-CM

## 2017-05-25 DIAGNOSIS — R0683 Snoring: Secondary | ICD-10-CM

## 2017-05-25 DIAGNOSIS — I10 Essential (primary) hypertension: Secondary | ICD-10-CM

## 2017-05-25 NOTE — Progress Notes (Signed)
[facility]  PATIENT: Micheal Levy DOB: 26-Mar-1981  REFERRING CLINICIAN: Mikhail, M HISTORY FROM: patient and chart review  REASON FOR VISIT: new consult    HISTORICAL  CHIEF COMPLAINT:  Chief Complaint  Patient presents with  . Stroke    rm 7, New Pt, Dr Roda Shutters, hospital FU, girlfriend- Lawanna Kobus, "feeling pretty good, did not have any therapy"    HISTORY OF PRESENT ILLNESS:   37 year old male here for evaluation of stroke.  History of hypertension, diabetes, cocaine abuse.  Patient was brought to the emergency room on 03/21/17 for 2 generalized seizures.  Patient was found to have blood sugar greater than 600 and systolic blood pressure 270.  Patient is was admitted to intensive care unit.  He was also diagnosed with bilateral punctate watershed ischemic infarctions, possibly related to labile blood pressure ranging from systolic 200s down to 100s.  Endocarditis or cocaine arteriopathy were felt to be less likely.  Since discharge patient gradually improving.  No residual numbness, weakness or slurred speech.  He is eating better and exercising.  He is not using any illicit substances.  His weight has reduced from 240 down to 220 pounds.  Patient does have some snoring and sleep.  His significant other has witnessed apnea for 2-3 seconds at a time.   REVIEW OF SYSTEMS: Full 14 system review of systems performed and negative with exception of: Feeling hot seizure.  ALLERGIES: No Known Allergies  HOME MEDICATIONS: Outpatient Medications Prior to Visit  Medication Sig Dispense Refill  . albuterol (PROVENTIL HFA;VENTOLIN HFA) 108 (90 Base) MCG/ACT inhaler Inhale 2 puffs into the lungs every 6 (six) hours as needed for wheezing or shortness of breath.    [name] albuterol (PROVENTIL) (2.5 MG/3ML) 0.083% nebulizer solution Take 3 mLs (2.5 mg total) by nebulization every 6 (six) hours as needed for wheezing or shortness of breath. 75 mL 12  . albuterol (PROVENTIL) (2.5  MG/3ML) 0.083% nebulizer solution Take 2.5 mg by nebulization every 6 (six) hours as needed for wheezing or shortness of breath.    [name] amLODipine (NORVASC) 10 MG tablet Take 1 tablet (10 mg total) by mouth daily. 30 tablet 5  . aspirin EC 81 MG tablet Take 81 mg by mouth daily as needed (pain/headache).    . Blood Glucose Monitoring Suppl (TRUE METRIX AIR GLUCOSE METER) DEVI 1 each by Does not apply route 4 (four) times daily -  before meals and at bedtime. 1 Device 0  . glucose blood (TRUE METRIX BLOOD GLUCOSE TEST) test strip Use as instructed 100 each 12  . hydrALAZINE (APRESOLINE) 100 MG tablet Take 1 tablet (100 mg total) by mouth 3 (three) times daily. 180 tablet 0  . hydrochlorothiazide (HYDRODIURIL) 25 MG tablet Take 1 tablet (25 mg total) by mouth daily. 30 tablet 5  . Insulin Glargine (LANTUS SOLOSTAR) 100 UNIT/ML Solostar Pen Inject 20 Units into the skin daily at 10 pm. 5 pen 11  . Insulin Pen Needle (PEN NEEDLES) 31G X 6 MM MISC 1 each by Does not apply route at bedtime. 100 each 12  . INSULIN SYRINGE .5CC/29G (B-D INS SYR ULTRAFINE .5CC/29G) 29G X 1/2" 0.5 ML MISC 1 each by Does not apply route at bedtime. 100 each 5  . Lancets MISC 1 each by Does not apply route 4 (four) times daily -  before meals and at bedtime. 100 each 5  . levETIRAcetam (KEPPRA) 500 MG tablet Take 1 tablet (500 mg total) by mouth 2 (two) times  daily. 60 tablet 5  . metFORMIN (GLUCOPHAGE) 500 MG tablet Take 1 tablet (500 mg total) by mouth 2 (two) times daily with a meal. 60 tablet 5  . metoprolol tartrate (LOPRESSOR) 25 MG tablet Take 1 tablet (25 mg total) by mouth 3 (three) times daily. 90 tablet 0   No facility-administered medications prior to visit.     PAST MEDICAL HISTORY: Past Medical History:  Diagnosis Date  . Chronic kidney disease   . Diabetes mellitus without complication (HCC)   . Hypertension   . Seizures (HCC) 03/2017   x 2   . Stroke (HCC) 03/2017  . Substance abuse (HCC)     PAST  SURGICAL HISTORY: No past surgical history on file.  FAMILY HISTORY: Family History  Problem Relation Age of Onset  . Diabetes Father   . Heart attack Father   . Diabetes Sister   . Hypertension Brother     SOCIAL HISTORY:  Social History   Socioeconomic History  . Marital status: Single    Spouse name: Not on file  . Number of children: Not on file  . Years of education: Not on file  . Highest education level: Not on file  Social Needs  . Financial resource strain: Not on file  . Food insecurity - worry: Not on file  . Food insecurity - inability: Not on file  . Transportation needs - medical: Not on file  . Transportation needs - non-medical: Not on file  Occupational History  . Not on file  Tobacco Use  . Smoking status: Former Smoker    Types: Cigarettes    Last attempt to quit: 04/25/1999    Years since quitting: 18.0  . Smokeless tobacco: Never Used  . Tobacco comment: 2001  Substance and Sexual Activity  . Alcohol use: Yes    Comment: occ, none since Dec 2018  . Drug use: Yes    Types: Cocaine, Marijuana    Comment: weed-daily. cocaine- once or twice a month, 05/25/17 avg 1/2 gm daily, no cocaine since Dec 2018   . Sexual activity: Yes  Other Topics Concern  . Not on file  Social History Narrative   ** Merged History Encounter **   05/25/17 Lives with girlfriend   05/25/17 currently out of work   Education- GED   Children- 3    no caffeine use     PHYSICAL EXAM  GENERAL EXAM/CONSTITUTIONAL: Vitals:  Vitals:   05/25/17 0832  BP: (!) 164/101  Pulse: 86  Weight: 214 lb (97.1 kg)  Height: 5\' 9"  (1.753 m)     Body mass index is 31.6 kg/m.  Visual Acuity Screening   Right eye Left eye Both eyes  Without correction: 20/30 20/30   With correction:        Patient is in no distress; well developed, nourished and groomed; neck is supple  CARDIOVASCULAR:  Examination of carotid arteries is normal; no carotid bruits  Regular rate and rhythm,  no murmurs  Examination of peripheral vascular system by observation and palpation is normal  EYES:  Ophthalmoscopic exam of optic discs and posterior segments is normal; no papilledema or hemorrhages  MUSCULOSKELETAL:  Gait, strength, tone, movements noted in Neurologic exam below  NEUROLOGIC: MENTAL STATUS:  No flowsheet data found.  awake, alert, oriented to person, place and time  recent and remote memory intact  normal attention and concentration  language fluent, comprehension intact, naming intact,   fund of knowledge appropriate  CRANIAL NERVE:   2nd -  no papilledema on fundoscopic exam  2nd, 3rd, 4th, 6th - pupils equal and reactive to light, visual fields full to confrontation, extraocular muscles intact, no nystagmus  5th - facial sensation symmetric  7th - facial strength symmetric  8th - hearing intact  9th - palate elevates symmetrically, uvula midline  11th - shoulder shrug symmetric  12th - tongue protrusion midline  MOTOR:   normal bulk and tone, full strength in the BUE, BLE  SENSORY:   normal and symmetric to light touch, temperature, vibration  COORDINATION:   finger-nose-finger, fine finger movements normal  REFLEXES:   deep tendon reflexes present and symmetric  GAIT/STATION:   narrow based gait; romberg is negative    DIAGNOSTIC DATA (LABS, IMAGING, TESTING) - I reviewed patient records, labs, notes, testing and imaging myself where available.  Lab Results  Component Value Date   WBC 8.4 03/26/2017   HGB 12.1 (L) 03/26/2017   HCT 37.3 (L) 03/26/2017   MCV 77.5 (L) 03/26/2017   PLT 229 03/26/2017      Component Value Date/Time   NA 142 05/04/2017 1103   K 3.4 (L) 05/04/2017 1103   CL 98 05/04/2017 1103   CO2 26 05/04/2017 1103   GLUCOSE 97 05/04/2017 1103   GLUCOSE 205 (H) 03/26/2017 0512   BUN 16 05/04/2017 1103   CREATININE 1.30 (H) 05/04/2017 1103   CALCIUM 9.6 05/04/2017 1103   PROT 7.1 04/02/2017 1034     ALBUMIN 4.1 04/02/2017 1034   AST 26 04/02/2017 1034   ALT 27 04/02/2017 1034   ALKPHOS 61 04/02/2017 1034   BILITOT 0.4 04/02/2017 1034   GFRNONAA 69 05/04/2017 1103   GFRAA 80 05/04/2017 1103   Lab Results  Component Value Date   CHOL 83 03/22/2017   HDL 23 (L) 03/22/2017   LDLCALC 25 03/22/2017   TRIG 174 (H) 03/22/2017   CHOLHDL 3.6 03/22/2017   Lab Results  Component Value Date   HGBA1C 7.8 05/04/2017   No results found for: VITAMINB12 Lab Results  Component Value Date   TSH 0.812 03/22/2017    03/22/17 EEG - Clinical Interpretation: This EEG is consistent with the patient's sedated state. - There was no seizure or seizure predisposition recorded on this study. Please note that a normal EEG does not preclude the possibility of epilepsy.   03/22/17 TTE - Severe LVH with LVEF 50-55% and grade 2 diastolic dysfunction. 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 Trivial mitral regurgitation. Mild tricuspid regurgitation with PASP estimated 34 mmHg.  03/24/17 carotid u/s Right Carotid: The extracranial vessels were near-normal with only minimal wall thickening or plaque. Left Carotid: The extracranial vessels were near-normal with only minimal wallthickening or plaque. Vertebrals: Left vertebral artery was patent with antegrade flow. Right vertebral artery was not visualized. Subclavians: Normal flow hemodynamics were seen in bilateral subclavian arteries.  03/25/17 Renal u/s - Right: No evidence of right renal artery stenosis. - Left: No evidence of left renal artery stenosis.  03/22/17 MRI brain [I reviewed images myself and agree with interpretation. -VRP]  1. Scattered punctate acute white matter infarcts in both cerebral hemispheres which may reflect watershed ischemia. 2. 2.5 cm right middle cranial fossa arachnoid cyst. 3. 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 These are typically incidental though rarely can be a  cause of seizures.  03/24/17 MRA head [I reviewed images myself and agree with interpretation. -VRP]  - No large vessel occlusion or correctable proximal stenosis. Distal vessel detail is  poor/ not reliable because of motion degradation.     ASSESSMENT AND PLAN  37 y.o. year old male here with malignant hypertension, diabetes, prior cocaine use, admitted to hospital in December 2018 for seizures, acute renal failure, strokes in setting of hyperglycemia and hypertensive emergency.  Now patient stabilized and doing better.    Dx:  1. Cerebrovascular accident (CVA), unspecified mechanism (HCC)   2. Snoring   3. Apnea   4. Accelerated hypertension      PLAN:  STROKE PREVENTION - continue aspirin, BP control, diabetes control - continue to improve nutrition and exercise - no smoking or alcohol - will check sleep study consult (snoring, apnea, stroke, HTN)  SEIZURE PREVENTION - continue levetiracetam 500mg  twice a day   Orders Placed This Encounter  Procedures  . Ambulatory referral to Sleep Studies   Return if symptoms worsen or fail to improve, for return to PCP.    Suanne Marker, MD 05/25/2017, 8:47 AM Certified in Neurology, Neurophysiology and Neuroimaging  [facility] [address] [place], [place] [address] [phone]

## 2017-05-25 NOTE — Patient Instructions (Signed)
-   continue aspirin, BP control, diabetes control  - continue to improve nutrition and exercise  - no smoking or alcohol  - will setup sleep study

## 2017-06-04 ENCOUNTER — Encounter: Payer: Self-pay | Admitting: Family Medicine

## 2017-06-04 ENCOUNTER — Ambulatory Visit (INDEPENDENT_AMBULATORY_CARE_PROVIDER_SITE_OTHER): Payer: Self-pay | Admitting: Family Medicine

## 2017-06-04 VITALS — BP 138/86 | HR 88 | Temp 98.5°F | Resp 16 | Ht 69.0 in | Wt 216.0 lb

## 2017-06-04 DIAGNOSIS — R809 Proteinuria, unspecified: Secondary | ICD-10-CM

## 2017-06-04 DIAGNOSIS — E119 Type 2 diabetes mellitus without complications: Secondary | ICD-10-CM

## 2017-06-04 DIAGNOSIS — Z794 Long term (current) use of insulin: Secondary | ICD-10-CM

## 2017-06-04 DIAGNOSIS — I10 Essential (primary) hypertension: Secondary | ICD-10-CM

## 2017-06-04 LAB — POCT URINALYSIS DIP (DEVICE)
BILIRUBIN URINE: NEGATIVE
Glucose, UA: NEGATIVE mg/dL
Hgb urine dipstick: NEGATIVE
Ketones, ur: NEGATIVE mg/dL
NITRITE: NEGATIVE
PH: 6.5 (ref 5.0–8.0)
PROTEIN: 30 mg/dL — AB
Specific Gravity, Urine: 1.02 (ref 1.005–1.030)
UROBILINOGEN UA: 0.2 mg/dL (ref 0.0–1.0)

## 2017-06-04 LAB — GLUCOSE, CAPILLARY: GLUCOSE-CAPILLARY: 106 mg/dL — AB (ref 65–99)

## 2017-06-04 NOTE — Progress Notes (Signed)
Subjective:    Patient ID: Micheal Levy, male    DOB: 1981-02-26, 37 y.o.   MRN: [id number]  Micheal Levy, a 37 year old male with a history of HTN, DMII, and CKD presents for a 2 month follow up. He says that he recently joined a gym and has been exercising routinely. Patient has quit smoking and is following a balanced diet. He says that blood sugars have decreased since beginning diet and exercise routine. He says that he feels well and is without complaint on today.    Diabetes  He has type 2 diabetes mellitus. His disease course has been improving. Pertinent negatives for diabetes include no blurred vision, no chest pain, no fatigue, no foot paresthesias, no foot ulcerations, no polydipsia, no polyphagia, no polyuria, no visual change, no weakness and no weight loss. Risk factors for coronary artery disease include dyslipidemia, hypertension and male sex. Current diabetic treatment includes insulin injections and oral agent (monotherapy). There is no change in his home blood glucose trend. He does not see a podiatrist.Eye exam is not current.  Hypertension  The problem is controlled. Pertinent negatives include no blurred vision, chest pain, orthopnea, palpitations or peripheral edema. Risk factors for coronary artery disease include diabetes mellitus, dyslipidemia and obesity. Hypertensive end-organ damage includes kidney disease.   Past Medical History:  Diagnosis Date  . Chronic kidney disease   . Diabetes mellitus without complication (HCC)   . Hypertension   . Seizures (HCC) 03/2017   x 2   . Stroke (HCC) 03/2017  . Substance abuse (HCC)    Social History   Socioeconomic History  . Marital status: Single    Spouse name: Not on file  . Number of children: Not on file  . Years of education: Not on file  . Highest education level: Not on file  Social Needs  . Financial resource strain: Not on file  . Food insecurity - worry: Not on file  . Food insecurity - inability:  Not on file  . Transportation needs - medical: Not on file  . Transportation needs - non-medical: Not on file  Occupational History  . Not on file  Tobacco Use  . Smoking status: Former Smoker    Types: Cigarettes    Last attempt to quit: 04/25/1999    Years since quitting: 18.1  . Smokeless tobacco: Never Used  . Tobacco comment: 2001  Substance and Sexual Activity  . Alcohol use: Yes    Comment: occ, none since Dec 2018  . Drug use: Yes    Types: Cocaine, Marijuana    Comment: weed-daily. cocaine- once or twice a month, 05/25/17 avg 1/2 gm daily, no cocaine since Dec 2018   . Sexual activity: Yes  Other Topics Concern  . Not on file  Social History Narrative   ** Merged History Encounter **   05/25/17 Lives with girlfriend   05/25/17 currently out of work   Education- GED   Children- 3    no caffeine use   Immunization History  Administered Date(s) Administered  . Influenza,inj,Quad PF,6+ Mos 03/23/2017  . Pneumococcal Polysaccharide-23 03/23/2017  . Tdap 05/04/2017   No Known Allergies Allergies as of 06/04/2017   No Known Allergies     Medication List        Accurate as of 06/04/17 11:59 PM. Always use your most recent med list.          albuterol 108 (90 Base) MCG/ACT inhaler Commonly known as:  PROVENTIL HFA;VENTOLIN  HFA Inhale 2 puffs into the lungs every 6 (six) hours as needed for wheezing or shortness of breath.   albuterol (2.5 MG/3ML) 0.083% nebulizer solution Commonly known as:  PROVENTIL Take 2.5 mg by nebulization every 6 (six) hours as needed for wheezing or shortness of breath.   albuterol (2.5 MG/3ML) 0.083% nebulizer solution Commonly known as:  PROVENTIL Take 3 mLs (2.5 mg total) by nebulization every 6 (six) hours as needed for wheezing or shortness of breath.   amLODipine 10 MG tablet Commonly known as:  NORVASC Take 1 tablet (10 mg total) by mouth daily.   aspirin EC 81 MG tablet Take 81 mg by mouth daily as needed (pain/headache).     glucose blood test strip Commonly known as:  TRUE METRIX BLOOD GLUCOSE TEST Use as instructed   hydrALAZINE 100 MG tablet Commonly known as:  APRESOLINE Take 1 tablet (100 mg total) by mouth 3 (three) times daily.   hydrochlorothiazide 25 MG tablet Commonly known as:  HYDRODIURIL Take 1 tablet (25 mg total) by mouth daily.   Insulin Glargine 100 UNIT/ML Solostar Pen Commonly known as:  LANTUS SOLOSTAR Inject 20 Units into the skin daily at 10 pm.   INSULIN SYRINGE .5CC/29G 29G X 1/2" 0.5 ML Misc Commonly known as:  B-D INS SYR ULTRAFINE .5CC/29G 1 each by Does not apply route at bedtime.   Lancets Misc 1 each by Does not apply route 4 (four) times daily -  before meals and at bedtime.   levETIRAcetam 500 MG tablet Commonly known as:  KEPPRA Take 1 tablet (500 mg total) by mouth 2 (two) times daily.   metFORMIN 500 MG tablet Commonly known as:  GLUCOPHAGE Take 1 tablet (500 mg total) by mouth 2 (two) times daily with a meal.   metoprolol tartrate 25 MG tablet Commonly known as:  LOPRESSOR Take 1 tablet (25 mg total) by mouth 3 (three) times daily.   Pen Needles 31G X 6 MM Misc 1 each by Does not apply route at bedtime.   TRUE METRIX AIR GLUCOSE METER Devi 1 each by Does not apply route 4 (four) times daily -  before meals and at bedtime.        Review of Systems  Constitutional: Negative for fatigue and weight loss.  Eyes: Negative for blurred vision.  Respiratory: Negative.   Cardiovascular: Negative for chest pain, palpitations and orthopnea.  Endocrine: Negative for polydipsia, polyphagia and polyuria.  Genitourinary: Negative.   Allergic/Immunologic: Negative.   Neurological: Negative.  Negative for weakness.  Hematological: Negative.        Objective:   Physical Exam  Constitutional: He is oriented to person, place, and time. He appears well-developed.  HENT:  Head: Normocephalic and atraumatic.  Right Ear: External ear normal.  Left Ear: External  ear normal.  Nose: Nose normal.  Mouth/Throat: Oropharynx is clear and moist.  Eyes: Pupils are equal, round, and reactive to light.  Neck: Normal range of motion.  Cardiovascular: Normal rate, regular rhythm, normal heart sounds and intact distal pulses.  Pulmonary/Chest: Effort normal and breath sounds normal. No respiratory distress.  Abdominal: Soft. Bowel sounds are normal.  Musculoskeletal: Normal range of motion.  Neurological: He is alert and oriented to person, place, and time.      BP 138/86 (BP Location: Left Arm, Patient Position: Sitting, Cuff Size: Large) Comment: manually  Pulse 88   Temp 98.5 F (36.9 C) (Oral)   Resp 16   Ht 5\' 9"  (1.753 m)  Wt 216 lb (98 kg)   SpO2 100%   BMI 31.90 kg/m  Assessment & Plan:  1. Type 2 diabetes mellitus without complication, with long-term current use of insulin (HCC) Hemoglobin a1C has decreased to 7.0, which is at goal.  Discontinued sliding scale Novolog.  Will continue Lantus 20 units at bedtime.  Continue to check CBG fasting and prior to meals.  Recommend a lowfat, low carbohydrate diet divided over 5-6 small meals, increase water intake to 6-8 glasses, and 150 minutes per week of cardiovascular exercise.   - Glucose, capillary - POCT urinalysis dip (device) - Hemoglobin A1c  2. Essential hypertension Blood pressure is at goal on current medication regimen.  No medication changes warranted on today Will review renal functioning as results become available - Continue medication, monitor blood pressure at home. Continue DASH diet. Reminder to go to the ER if any CP, SOB, nausea, dizziness, severe HA, changes vision/speech, left arm numbness and tingling and jaw pain.    - POCT urinalysis dip (device) - Basic Metabolic Panel  3. Proteinuria, unspecified type Mild proteinuria present on urinalysis. Discussed the importance of maintaining control of blood pressure and diabetes to prevent worsening CKD.   RTC: 3  months for chronic conditions   Nolon NationsLachina Moore Kyrie Fludd  MSN, FNP-C Patient Care [facility] [address]  [place], [place] [id number] [phone]

## 2017-06-05 LAB — HEMOGLOBIN A1C
Est. average glucose Bld gHb Est-mCnc: 154 mg/dL
Hgb A1c MFr Bld: 7 % — ABNORMAL HIGH (ref 4.8–5.6)

## 2017-06-05 LAB — BASIC METABOLIC PANEL
BUN / CREAT RATIO: 16 (ref 9–20)
BUN: 19 mg/dL (ref 6–20)
CALCIUM: 9.5 mg/dL (ref 8.7–10.2)
CHLORIDE: 101 mmol/L (ref 96–106)
CO2: 26 mmol/L (ref 20–29)
Creatinine, Ser: 1.22 mg/dL (ref 0.76–1.27)
GFR calc non Af Amer: 75 mL/min/{1.73_m2} (ref >59)
GFR, EST AFRICAN AMERICAN: 86 mL/min/{1.73_m2} (ref >59)
GLUCOSE: 95 mg/dL (ref 65–99)
POTASSIUM: 3.1 mmol/L — AB (ref 3.5–5.2)
Sodium: 142 mmol/L (ref 134–144)

## 2017-06-05 MED FILL — hydrALAZINE HCL 100 MG TABS: 100 | 30 days supply | Qty: 90 | Fill #0

## 2017-06-06 ENCOUNTER — Other Ambulatory Visit: Payer: Self-pay | Admitting: Family Medicine

## 2017-06-06 DIAGNOSIS — E876 Hypokalemia: Secondary | ICD-10-CM

## 2017-06-06 MED ORDER — POTASSIUM CHLORIDE ER 10 MEQ PO TBCR: 10.0000 meq | EXTENDED_RELEASE_TABLET | Freq: Every day | ORAL | 0 refills | Status: DC

## 2017-06-06 NOTE — Progress Notes (Signed)
Meds ordered this encounter  Medications  . potassium chloride (K-DUR) 10 MEQ tablet    Sig: Take 1 tablet (10 mEq total) by mouth daily.    Dispense:  30 tablet    Refill:  0    Nolon NationsLachina Moore Sharyl Panchal  MSN, FNP-C Patient [facility] [address]  [place], [place] [id number] [phone]

## 2017-06-08 MED FILL — POTASSIUM CL 10 MEQ TAB SA: 10 | 30 days supply | Qty: 30 | Fill #0

## 2017-06-09 ENCOUNTER — Telehealth: Payer: Self-pay

## 2017-06-09 ENCOUNTER — Other Ambulatory Visit: Payer: Self-pay

## 2017-06-09 DIAGNOSIS — E119 Type 2 diabetes mellitus without complications: Secondary | ICD-10-CM

## 2017-06-09 DIAGNOSIS — I10 Essential (primary) hypertension: Secondary | ICD-10-CM

## 2017-06-09 MED ORDER — METFORMIN HCL 500 MG PO TABS: 500.0000 mg | ORAL_TABLET | Freq: Two times a day (BID) | ORAL | 5 refills | Status: DC

## 2017-06-09 MED ORDER — METOPROLOL TARTRATE 25 MG PO TABS: 25.0000 mg | ORAL_TABLET | Freq: Three times a day (TID) | ORAL | 0 refills | Status: DC

## 2017-06-09 MED ORDER — HYDROCHLOROTHIAZIDE 25 MG PO TABS: 25.0000 mg | ORAL_TABLET | Freq: Every day | ORAL | 5 refills | Status: DC

## 2017-06-09 MED FILL — METOPROLOL TARTRATE 25 MG T: 25 | 30 days supply | Qty: 90 | Fill #0

## 2017-06-09 MED FILL — metFORMIN HCL 500 MG TABS: 500 | 30 days supply | Qty: 60 | Fill #0

## 2017-06-09 MED FILL — HYDROCHLOROTHIAZIDE 25 MG T: 25 | 30 days supply | Qty: 30 | Fill #0

## 2017-06-09 NOTE — Telephone Encounter (Signed)
Spoke with patient and advised that potassium level is low and that we will start potassium 10meq daily. Advised that hemoglobin a1c has decreased to 7 which is at goal and that he needs to continue medication as previously prescribed, take them consistently, and to follow a low carb diet. Patient verbalized understanding. He has an appointment scheduled on 07/02/2017 for follow up and labs can be drawn at that time. Thanks!

## 2017-06-09 NOTE — Patient Instructions (Signed)
Your hemoglobin a1C has decreased to 7.0, which is at goal.  Discontinue Novolog for meal coverage. Will continue Lantus 20 units at bedtime and Metformin 500 mg twice daily with meals.  Your blood pressure is at goal. No medication changes warranted on today.  Your A1C goal is less than 7. Your fasting blood sugar  Upon awakening goal is between 110-140.  Your LDL  (bad cholesterol goal is less than 100) Blood pressure goal is <140/90.  Recommend a lowfat, low carbohydrate diet divided over 5-6 small meals, increase water intake to 6-8 glasses, and 150 minutes per week of cardiovascular exercise.   Take your medications as prescribed Make sure that you are familiar with each one of your medications and what they are used to treat.  If you are unsure of medications, please bring to follow up Will send referral for eye exam  Please keep your scheduled follow up appointment.

## 2017-06-09 NOTE — Telephone Encounter (Signed)
Medication refills for metformin, metoprolol, and hctz sent into pharmacy. Thanks!

## 2017-06-09 NOTE — Telephone Encounter (Signed)
-----   Message from [name], [place]NP sent at 06/06/2017  8:06 AM EST ----- Regarding: lab results Please inform patient that potassium level was low, will start potassium 10 mEq daily.  Will recheck levels in 1 month, please schedule lab appointment.  Also, hemoglobin A1c has decreased to 7, which is at goal.  Continue medications as previously prescribed.  Also, stressed the importance of taking medications consistently and following a carbohydrate modify diet.   Aggie Hackerhanksba

## 2017-06-17 ENCOUNTER — Telehealth: Payer: Self-pay

## 2017-06-17 ENCOUNTER — Ambulatory Visit: Payer: Self-pay | Attending: Family Medicine

## 2017-06-17 ENCOUNTER — Other Ambulatory Visit: Payer: Self-pay

## 2017-06-17 DIAGNOSIS — E876 Hypokalemia: Secondary | ICD-10-CM

## 2017-06-17 DIAGNOSIS — E119 Type 2 diabetes mellitus without complications: Secondary | ICD-10-CM

## 2017-06-17 DIAGNOSIS — E1165 Type 2 diabetes mellitus with hyperglycemia: Secondary | ICD-10-CM

## 2017-06-17 DIAGNOSIS — Z87898 Personal history of other specified conditions: Secondary | ICD-10-CM

## 2017-06-17 DIAGNOSIS — I10 Essential (primary) hypertension: Secondary | ICD-10-CM

## 2017-06-17 MED ORDER — METFORMIN HCL 500 MG PO TABS: 500.0000 mg | ORAL_TABLET | Freq: Two times a day (BID) | ORAL | 5 refills | Status: DC

## 2017-06-17 MED ORDER — ALBUTEROL SULFATE (2.5 MG/3ML) 0.083% IN NEBU: 2.5000 mg | INHALATION_SOLUTION | Freq: Four times a day (QID) | RESPIRATORY_TRACT | 12 refills | Status: DC | PRN

## 2017-06-17 MED ORDER — METOPROLOL TARTRATE 25 MG PO TABS: 25.0000 mg | ORAL_TABLET | Freq: Three times a day (TID) | ORAL | 0 refills | Status: DC

## 2017-06-17 MED ORDER — HYDROCHLOROTHIAZIDE 25 MG PO TABS: 25.0000 mg | ORAL_TABLET | Freq: Every day | ORAL | 5 refills | Status: DC

## 2017-06-17 MED ORDER — AMLODIPINE BESYLATE 10 MG PO TABS: 10.0000 mg | ORAL_TABLET | Freq: Every day | ORAL | 5 refills | Status: DC

## 2017-06-17 MED ORDER — INSULIN GLARGINE 100 UNIT/ML SOLOSTAR PEN: 20.0000 [IU] | PEN_INJECTOR | Freq: Every day | SUBCUTANEOUS | 11 refills | Status: DC

## 2017-06-17 MED ORDER — HYDRALAZINE HCL 100 MG PO TABS: 100.0000 mg | ORAL_TABLET | Freq: Three times a day (TID) | ORAL | 0 refills | Status: DC

## 2017-06-17 MED ORDER — POTASSIUM CHLORIDE ER 10 MEQ PO TBCR: 10.0000 meq | EXTENDED_RELEASE_TABLET | Freq: Every day | ORAL | 0 refills | Status: DC

## 2017-06-17 MED ORDER — LEVETIRACETAM 500 MG PO TABS: 500.0000 mg | ORAL_TABLET | Freq: Two times a day (BID) | ORAL | 5 refills | Status: DC

## 2017-06-17 MED ORDER — ASPIRIN EC 81 MG PO TBEC: 81.0000 mg | DELAYED_RELEASE_TABLET | Freq: Every day | ORAL | 3 refills | Status: DC | PRN

## 2017-06-17 MED FILL — !LANTUS SOLOSTAR 100UNITS/M: 100 | 30 days supply | Qty: 6 | Fill #0

## 2017-06-17 MED FILL — levETIRAcetam 500 MG TABS: 500 | 30 days supply | Qty: 60 | Fill #0

## 2017-06-17 MED FILL — AMLODIPINE BESYLATE 10 MG T: 10 | 30 days supply | Qty: 30 | Fill #0

## 2017-06-17 MED FILL — ALBUTEROL SUL 2.5 MG/3 ML S: (2.5 MG/3ML | 6 days supply | Qty: 75 | Fill #0

## 2017-06-17 NOTE — Telephone Encounter (Signed)
Refills sent into pharmacy. Thanks!  

## 2017-07-02 ENCOUNTER — Ambulatory Visit (INDEPENDENT_AMBULATORY_CARE_PROVIDER_SITE_OTHER): Payer: Self-pay | Admitting: Family Medicine

## 2017-07-02 ENCOUNTER — Other Ambulatory Visit: Payer: Self-pay | Admitting: Family Medicine

## 2017-07-02 ENCOUNTER — Encounter: Payer: Self-pay | Admitting: Family Medicine

## 2017-07-02 VITALS — BP 146/98 | HR 83 | Temp 98.4°F | Resp 16 | Ht 69.0 in | Wt 223.0 lb

## 2017-07-02 DIAGNOSIS — I10 Essential (primary) hypertension: Secondary | ICD-10-CM

## 2017-07-02 DIAGNOSIS — E876 Hypokalemia: Secondary | ICD-10-CM

## 2017-07-02 NOTE — Patient Instructions (Addendum)
Will follow up by phone for any abnormal laboratory results.  We have discussed target BP range and blood pressure goal. I have advised patient to check BP regularly and to call us back or report to clinic if the numbers are consistently higher than 140/90. We discussed the importance of compliance with medical therapy and DASH diet recommended, consequences of uncontrolled hypertension discussed.  - continue current BP medications  DASH Eating Plan DASH stands for "Dietary Approaches to Stop Hypertension." The DASH eating plan is a healthy eating plan that has been shown to reduce high blood pressure (hypertension). It may also reduce your risk for type 2 diabetes, heart disease, and stroke. The DASH eating plan may also help with weight loss. What are tips for following this plan? General guidelines  Avoid eating more than 2,300 mg (milligrams) of salt (sodium) a day. If you have hypertension, you may need to reduce your sodium intake to 1,500 mg a day.  Limit alcohol intake to no more than 1 drink a day for nonpregnant women and 2 drinks a day for men. One drink equals 12 oz of beer, 5 oz of wine, or 1 oz of hard liquor.  Work with your health care provider to maintain a healthy body weight or to lose weight. Ask what an ideal weight is for you.  Get at least 30 minutes of exercise that causes your heart to beat faster (aerobic exercise) most days of the week. Activities may include walking, swimming, or biking.  Work with your health care provider or diet and nutrition specialist (dietitian) to adjust your eating plan to your individual calorie needs. Reading food labels  Check food labels for the amount of sodium per serving. Choose foods with less than 5 percent of the Daily Value of sodium. Generally, foods with less than 300 mg of sodium per serving fit into this eating plan.  To find whole grains, look for the word "whole" as the first word in the ingredient list. Shopping  Buy  products labeled as "low-sodium" or "no salt added."  Buy fresh foods. Avoid canned foods and premade or frozen meals. Cooking  Avoid adding salt when cooking. Use salt-free seasonings or herbs instead of table salt or sea salt. Check with your health care provider or pharmacist before using salt substitutes.  Do not fry foods. Cook foods using healthy methods such as baking, boiling, grilling, and broiling instead.  Cook with heart-healthy oils, such as olive, canola, soybean, or sunflower oil. Meal planning   Eat a balanced diet that includes: ? 5 or more servings of fruits and vegetables each day. At each meal, try to fill half of your plate with fruits and vegetables. ? Up to 6-8 servings of whole grains each day. ? Less than 6 oz of lean meat, poultry, or fish each day. A 3-oz serving of meat is about the same size as a deck of cards. One egg equals 1 oz. ? 2 servings of low-fat dairy each day. ? A serving of nuts, seeds, or beans 5 times each week. ? Heart-healthy fats. Healthy fats called Omega-3 fatty acids are found in foods such as flaxseeds and coldwater fish, like sardines, salmon, and mackerel.  Limit how much you eat of the following: ? Canned or prepackaged foods. ? Food that is high in trans fat, such as fried foods. ? Food that is high in saturated fat, such as fatty meat. ? Sweets, desserts, sugary drinks, and other foods with added sugar. ? Full-fat  dairy products.  Do not salt foods before eating.  Try to eat at least 2 vegetarian meals each week.  Eat more home-cooked food and less restaurant, buffet, and fast food.  When eating at a restaurant, ask that your food be prepared with less salt or no salt, if possible. What foods are recommended? The items listed may not be a complete list. Talk with your dietitian about what dietary choices are best for you. Grains Whole-grain or whole-wheat bread. Whole-grain or whole-wheat pasta. Brown rice. Orpah Cobb.  Bulgur. Whole-grain and low-sodium cereals. Pita bread. Low-fat, low-sodium crackers. Whole-wheat flour tortillas. Vegetables Fresh or frozen vegetables (raw, steamed, roasted, or grilled). Low-sodium or reduced-sodium tomato and vegetable juice. Low-sodium or reduced-sodium tomato sauce and tomato paste. Low-sodium or reduced-sodium canned vegetables. Fruits All fresh, dried, or frozen fruit. Canned fruit in natural juice (without added sugar). Meat and other protein foods Skinless chicken or [place]. Ground chicken or [place]. Pork with fat trimmed off. Fish and seafood. Egg whites. Dried beans, peas, or lentils. Unsalted nuts, nut butters, and seeds. Unsalted canned beans. Lean cuts of beef with fat trimmed off. Low-sodium, lean deli meat. Dairy Low-fat (1%) or fat-free (skim) milk. Fat-free, low-fat, or reduced-fat cheeses. Nonfat, low-sodium ricotta or cottage cheese. Low-fat or nonfat yogurt. Low-fat, low-sodium cheese. Fats and oils Soft margarine without trans fats. Vegetable oil. Low-fat, reduced-fat, or light mayonnaise and salad dressings (reduced-sodium). Canola, safflower, olive, soybean, and sunflower oils. Avocado. Seasoning and other foods Herbs. Spices. Seasoning mixes without salt. Unsalted popcorn and pretzels. Fat-free sweets. What foods are not recommended? The items listed may not be a complete list. Talk with your dietitian about what dietary choices are best for you. Grains Baked goods made with fat, such as croissants, muffins, or some breads. Dry pasta or rice meal packs. Vegetables Creamed or fried vegetables. Vegetables in a cheese sauce. Regular canned vegetables (not low-sodium or reduced-sodium). Regular canned tomato sauce and paste (not low-sodium or reduced-sodium). Regular tomato and vegetable juice (not low-sodium or reduced-sodium). Rosita Fire. Olives. Fruits Canned fruit in a light or heavy syrup. Fried fruit. Fruit in cream or butter sauce. Meat and other  protein foods Fatty cuts of meat. Ribs. Fried meat. Tomasa Blase. Sausage. Bologna and other processed lunch meats. Salami. Fatback. Hotdogs. Bratwurst. Salted nuts and seeds. Canned beans with added salt. Canned or smoked fish. Whole eggs or egg yolks. Chicken or [place] with skin. Dairy Whole or 2% milk, cream, and half-and-half. Whole or full-fat cream cheese. Whole-fat or sweetened yogurt. Full-fat cheese. Nondairy creamers. Whipped toppings. Processed cheese and cheese spreads. Fats and oils Butter. Stick margarine. Lard. Shortening. Ghee. Bacon fat. Tropical oils, such as coconut, palm kernel, or palm oil. Seasoning and other foods Salted popcorn and pretzels. Onion salt, garlic salt, seasoned salt, table salt, and sea salt. Worcestershire sauce. Tartar sauce. Barbecue sauce. Teriyaki sauce. Soy sauce, including reduced-sodium. Steak sauce. Canned and packaged gravies. Fish sauce. Oyster sauce. Cocktail sauce. Horseradish that you find on the shelf. Ketchup. Mustard. Meat flavorings and tenderizers. Bouillon cubes. Hot sauce and Tabasco sauce. Premade or packaged marinades. Premade or packaged taco seasonings. Relishes. Regular salad dressings. Where to find more information:  National Heart, Lung, and Blood Institute: PopSteam.is  American Heart Association: www.heart.org Summary  The DASH eating plan is a healthy eating plan that has been shown to reduce high blood pressure (hypertension). It may also reduce your risk for type 2 diabetes, heart disease, and stroke.  With the DASH eating plan, you should  limit salt (sodium) intake to 2,300 mg a day. If you have hypertension, you may need to reduce your sodium intake to 1,500 mg a day.  When on the DASH eating plan, aim to eat more fresh fruits and vegetables, whole grains, lean proteins, low-fat dairy, and heart-healthy fats.  Work with your health care provider or diet and nutrition specialist (dietitian) to adjust your eating plan to your  individual calorie needs. This information is not intended to replace advice given to you by your health care provider. Make sure you discuss any questions you have with your health care provider. Document Released: 03/13/2011 Document Revised: 03/17/2016 Document Reviewed: 03/17/2016 Elsevier Interactive Patient Education  [employer].

## 2017-07-02 NOTE — Progress Notes (Signed)
Subjective:    Patient ID: Micheal FussShuron J Godar, male    DOB: Jun 19, 1980, 37 y.o.   MRN: [id number]  Patient presents for a follow up of hypertension. Pertinent negatives include no blurred vision, chest pain, orthopnea, palpitations or peripheral edema. Risk factors for coronary artery disease include diabetes mellitus, dyslipidemia and obesity. Hypertensive end-organ damage includes kidney disease. Potassium was decreased 1 month ago and treated with potassium. Patient says that he completed supplement. He has been following a balanced diet and exercises consistently.   Past Medical History:  Diagnosis Date  . Chronic kidney disease   . Diabetes mellitus without complication (HCC)   . Hypertension   . Seizures (HCC) 03/2017   x 2   . Stroke (HCC) 03/2017  . Substance abuse (HCC)    Social History   Socioeconomic History  . Marital status: Single    Spouse name: Not on file  . Number of children: Not on file  . Years of education: Not on file  . Highest education level: Not on file  Occupational History  . Not on file  Social Needs  . Financial resource strain: Not on file  . Food insecurity:    Worry: Not on file    Inability: Not on file  . Transportation needs:    Medical: Not on file    Non-medical: Not on file  Tobacco Use  . Smoking status: Former Smoker    Types: Cigarettes    Last attempt to quit: 04/25/1999    Years since quitting: 18.2  . Smokeless tobacco: Never Used  . Tobacco comment: 2001  Substance and Sexual Activity  . Alcohol use: Yes    Comment: occ, none since Dec 2018  . Drug use: Yes    Types: Cocaine, Marijuana    Comment: weed-daily. cocaine- once or twice a month, 05/25/17 avg 1/2 gm daily, no cocaine since Dec 2018   . Sexual activity: Yes  Lifestyle  . Physical activity:    Days per week: Not on file    Minutes per session: Not on file  . Stress: Not on file  Relationships  . Social connections:    Talks on phone: Not on file    Gets  together: Not on file    Attends religious service: Not on file    Active member of club or organization: Not on file    Attends meetings of clubs or organizations: Not on file    Relationship status: Not on file  . Intimate partner violence:    Fear of current or ex partner: Not on file    Emotionally abused: Not on file    Physically abused: Not on file    Forced sexual activity: Not on file  Other Topics Concern  . Not on file  Social History Narrative   ** Merged History Encounter **   05/25/17 Lives with girlfriend   05/25/17 currently out of work   Education- GED   Children- 3    no caffeine use   Immunization History  Administered Date(s) Administered  . Influenza,inj,Quad PF,6+ Mos 03/23/2017  . Pneumococcal Polysaccharide-23 03/23/2017  . Tdap 05/04/2017   No Known Allergies Allergies as of 07/02/2017   No Known Allergies     Medication List        Accurate as of 07/02/17 10:22 AM. Always use your most recent med list.          albuterol 108 (90 Base) MCG/ACT inhaler Commonly known as:  PROVENTIL  HFA;VENTOLIN HFA Inhale 2 puffs into the lungs every 6 (six) hours as needed for wheezing or shortness of breath.   albuterol (2.5 MG/3ML) 0.083% nebulizer solution Commonly known as:  PROVENTIL Take 3 mLs (2.5 mg total) by nebulization every 6 (six) hours as needed for wheezing or shortness of breath.   albuterol (2.5 MG/3ML) 0.083% nebulizer solution Commonly known as:  PROVENTIL Take 3 mLs (2.5 mg total) by nebulization every 6 (six) hours as needed for wheezing or shortness of breath.   amLODipine 10 MG tablet Commonly known as:  NORVASC Take 1 tablet (10 mg total) by mouth daily.   aspirin EC 81 MG tablet Take 1 tablet (81 mg total) by mouth daily as needed (pain/headache).   glucose blood test strip Commonly known as:  TRUE METRIX BLOOD GLUCOSE TEST Use as instructed   hydrALAZINE 100 MG tablet Commonly known as:  APRESOLINE Take 1 tablet (100 mg  total) by mouth 3 (three) times daily.   hydrochlorothiazide 25 MG tablet Commonly known as:  HYDRODIURIL Take 1 tablet (25 mg total) by mouth daily.   Insulin Glargine 100 UNIT/ML Solostar Pen Commonly known as:  LANTUS SOLOSTAR Inject 20 Units into the skin daily at 10 pm.   INSULIN SYRINGE .5CC/29G 29G X 1/2" 0.5 ML Misc Commonly known as:  B-D INS SYR ULTRAFINE .5CC/29G 1 each by Does not apply route at bedtime.   Lancets Misc 1 each by Does not apply route 4 (four) times daily -  before meals and at bedtime.   levETIRAcetam 500 MG tablet Commonly known as:  KEPPRA Take 1 tablet (500 mg total) by mouth 2 (two) times daily.   metFORMIN 500 MG tablet Commonly known as:  GLUCOPHAGE Take 1 tablet (500 mg total) by mouth 2 (two) times daily with a meal.   metoprolol tartrate 25 MG tablet Commonly known as:  LOPRESSOR Take 1 tablet (25 mg total) by mouth 3 (three) times daily.   Pen Needles 31G X 6 MM Misc 1 each by Does not apply route at bedtime.   potassium chloride 10 MEQ tablet Commonly known as:  K-DUR Take 1 tablet (10 mEq total) by mouth daily.   TRUE METRIX AIR GLUCOSE METER Devi 1 each by Does not apply route 4 (four) times daily -  before meals and at bedtime.        Review of Systems  Constitutional: Negative for fatigue and weight loss.  Eyes: Negative for blurred vision.  Respiratory: Negative.   Cardiovascular: Negative for chest pain, palpitations and orthopnea.  Endocrine: Negative for polydipsia, polyphagia and polyuria.  Genitourinary: Negative.   Allergic/Immunologic: Negative.   Neurological: Negative.  Negative for weakness.  Hematological: Negative.        Objective:   Physical Exam  Constitutional: He is oriented to person, place, and time. He appears well-developed.  HENT:  Head: Normocephalic and atraumatic.  Right Ear: External ear normal.  Left Ear: External ear normal.  Nose: Nose normal.  Mouth/Throat: Oropharynx is clear and  moist.  Eyes: Pupils are equal, round, and reactive to light.  Neck: Normal range of motion.  Cardiovascular: Normal rate, regular rhythm, normal heart sounds and intact distal pulses.  Pulmonary/Chest: Effort normal and breath sounds normal. No respiratory distress.  Abdominal: Soft. Bowel sounds are normal.  Musculoskeletal: Normal range of motion.  Neurological: He is alert and oriented to person, place, and time.      BP (!) 146/98   Pulse 83   Temp 98.4  F (36.9 C) (Oral)   Resp 16   Ht 5\' 9"  (1.753 m)   Wt 223 lb (101.2 kg)   SpO2 99%   BMI 32.93 kg/m  Assessment & Plan:  1. Essential hypertension Blood pressure is above goal. Patient did not take antihypertensive medication prior to arrival. Patient to return for blood pressure check after taking medication.  Reviewed urinalysis, proteinuria present.  Will review renal functioning as results become available Continue medication, monitor blood pressure at home. Continue DASH diet. Reminded to go to the ER if any CP, SOB, nausea, dizziness, severe HA, changes vision/speech, left arm numbness and tingling and jaw pain.    - Basic Metabolic Panel  2. Hypokalemia Recommend that Zacharee continues a potassium rich diet.  - Basic Metabolic Panel   RTC: 3 months for DM2 and HTN  Braydyn Schultes Rennis Petty  MSN, FNP-C Patient [facility] [address] [place], [place] [address] [phone]

## 2017-07-03 LAB — BASIC METABOLIC PANEL
BUN / CREAT RATIO: 13 (ref 9–20)
BUN: 16 mg/dL (ref 6–20)
CO2: 25 mmol/L (ref 20–29)
Calcium: 9.7 mg/dL (ref 8.7–10.2)
Chloride: 99 mmol/L (ref 96–106)
Creatinine, Ser: 1.22 mg/dL (ref 0.76–1.27)
GFR calc Af Amer: 86 mL/min/{1.73_m2} (ref >59)
GFR, EST NON AFRICAN AMERICAN: 75 mL/min/{1.73_m2} (ref >59)
GLUCOSE: 117 mg/dL — AB (ref 65–99)
POTASSIUM: 3.5 mmol/L (ref 3.5–5.2)
Sodium: 141 mmol/L (ref 134–144)

## 2017-07-13 ENCOUNTER — Telehealth: Payer: Self-pay

## 2017-07-13 ENCOUNTER — Institutional Professional Consult (permissible substitution): Payer: Self-pay | Admitting: Neurology

## 2017-07-13 NOTE — Telephone Encounter (Signed)
Pt did not show for their appt with Dr. Athar today.  

## 2017-07-14 ENCOUNTER — Encounter: Payer: Self-pay | Admitting: Neurology

## 2017-07-14 ENCOUNTER — Encounter: Payer: Self-pay | Admitting: Family Medicine

## 2017-08-10 ENCOUNTER — Telehealth: Payer: Self-pay

## 2017-08-10 DIAGNOSIS — E876 Hypokalemia: Secondary | ICD-10-CM

## 2017-08-10 DIAGNOSIS — I10 Essential (primary) hypertension: Secondary | ICD-10-CM

## 2017-08-10 DIAGNOSIS — E1165 Type 2 diabetes mellitus with hyperglycemia: Secondary | ICD-10-CM

## 2017-08-10 DIAGNOSIS — E119 Type 2 diabetes mellitus without complications: Secondary | ICD-10-CM

## 2017-08-10 DIAGNOSIS — Z87898 Personal history of other specified conditions: Secondary | ICD-10-CM

## 2017-08-10 MED ORDER — HYDRALAZINE HCL 100 MG PO TABS: 100.0000 mg | ORAL_TABLET | Freq: Three times a day (TID) | ORAL | 0 refills | Status: DC

## 2017-08-10 MED ORDER — ASPIRIN EC 81 MG PO TBEC: 81.0000 mg | DELAYED_RELEASE_TABLET | Freq: Every day | ORAL | 3 refills | Status: DC | PRN

## 2017-08-10 MED ORDER — AMLODIPINE BESYLATE 10 MG PO TABS: 10.0000 mg | ORAL_TABLET | Freq: Every day | ORAL | 5 refills | Status: DC

## 2017-08-10 MED ORDER — METFORMIN HCL 500 MG PO TABS: 500.0000 mg | ORAL_TABLET | Freq: Two times a day (BID) | ORAL | 3 refills | Status: DC

## 2017-08-10 MED ORDER — POTASSIUM CHLORIDE ER 10 MEQ PO TBCR: 10.0000 meq | EXTENDED_RELEASE_TABLET | Freq: Every day | ORAL | 3 refills | Status: DC

## 2017-08-10 MED ORDER — ALBUTEROL SULFATE HFA 108 (90 BASE) MCG/ACT IN AERS: 2.0000 | INHALATION_SPRAY | Freq: Four times a day (QID) | RESPIRATORY_TRACT | 3 refills | Status: DC | PRN

## 2017-08-10 MED ORDER — LEVETIRACETAM 500 MG PO TABS: 500.0000 mg | ORAL_TABLET | Freq: Two times a day (BID) | ORAL | 3 refills | Status: DC

## 2017-08-10 MED ORDER — INSULIN GLARGINE 100 UNIT/ML SOLOSTAR PEN: 20.0000 [IU] | PEN_INJECTOR | Freq: Every day | SUBCUTANEOUS | 11 refills | Status: DC

## 2017-08-10 MED ORDER — HYDROCHLOROTHIAZIDE 25 MG PO TABS: 25.0000 mg | ORAL_TABLET | Freq: Every day | ORAL | 5 refills | Status: DC

## 2017-08-10 MED ORDER — METOPROLOL TARTRATE 25 MG PO TABS: 25.0000 mg | ORAL_TABLET | Freq: Three times a day (TID) | ORAL | 3 refills | Status: DC

## 2017-08-10 MED FILL — AMLODIPINE BESYLATE 10 MG T: 10 | 30 days supply | Qty: 30 | Fill #0

## 2017-08-10 MED FILL — ?METOPROLOL TARTRATE 25 MG: 25 | 30 days supply | Qty: 90 | Fill #0

## 2017-08-10 MED FILL — metFORMIN HCL 500 MG TABS: 500 | 30 days supply | Qty: 60 | Fill #0

## 2017-08-10 MED FILL — POTASSIUM CL 10 MEQ TAB SA: 10 | 30 days supply | Qty: 30 | Fill #0

## 2017-08-10 MED FILL — HYDROCHLOROTHIAZIDE 25 MG T: 25 | 30 days supply | Qty: 30 | Fill #0

## 2017-08-10 MED FILL — hydrALAZINE HCL 100 MG TABS: 100 | 30 days supply | Qty: 90 | Fill #0

## 2017-08-10 MED FILL — levETIRAcetam 500 MG TABS: 500 | 30 days supply | Qty: 60 | Fill #0

## 2017-08-10 MED FILL — !VENTOLIN HFA INHALER: 108 (90 BAS | 18 days supply | Qty: 18 | Fill #0

## 2017-08-10 NOTE — Telephone Encounter (Signed)
Refills sent into pharmacy. Thanks!  

## 2017-10-05 ENCOUNTER — Telehealth: Payer: Self-pay

## 2017-10-05 DIAGNOSIS — E876 Hypokalemia: Secondary | ICD-10-CM

## 2017-10-05 DIAGNOSIS — I10 Essential (primary) hypertension: Secondary | ICD-10-CM

## 2017-10-05 DIAGNOSIS — E1165 Type 2 diabetes mellitus with hyperglycemia: Secondary | ICD-10-CM

## 2017-10-05 DIAGNOSIS — Z87898 Personal history of other specified conditions: Secondary | ICD-10-CM

## 2017-10-05 DIAGNOSIS — E119 Type 2 diabetes mellitus without complications: Secondary | ICD-10-CM

## 2017-10-05 MED ORDER — AMLODIPINE BESYLATE 10 MG PO TABS: 10.0000 mg | ORAL_TABLET | Freq: Every day | ORAL | 5 refills | Status: DC

## 2017-10-05 MED ORDER — POTASSIUM CHLORIDE ER 10 MEQ PO TBCR: 10.0000 meq | EXTENDED_RELEASE_TABLET | Freq: Every day | ORAL | 3 refills | Status: DC

## 2017-10-05 MED ORDER — GLUCOSE BLOOD VI STRP
ORAL_STRIP | 12 refills | Status: DC
Start: 2017-10-05 — End: 2019-04-19

## 2017-10-05 MED ORDER — HYDRALAZINE HCL 100 MG PO TABS: 100.0000 mg | ORAL_TABLET | Freq: Three times a day (TID) | ORAL | 0 refills | Status: DC

## 2017-10-05 MED ORDER — INSULIN GLARGINE 100 UNIT/ML SOLOSTAR PEN: 20.0000 [IU] | PEN_INJECTOR | Freq: Every day | SUBCUTANEOUS | 11 refills | Status: DC

## 2017-10-05 MED ORDER — ALBUTEROL SULFATE HFA 108 (90 BASE) MCG/ACT IN AERS: 2.0000 | INHALATION_SPRAY | Freq: Four times a day (QID) | RESPIRATORY_TRACT | 3 refills | Status: DC | PRN

## 2017-10-05 MED ORDER — HYDROCHLOROTHIAZIDE 25 MG PO TABS: 25.0000 mg | ORAL_TABLET | Freq: Every day | ORAL | 5 refills | Status: DC

## 2017-10-05 MED ORDER — LEVETIRACETAM 500 MG PO TABS: 500.0000 mg | ORAL_TABLET | Freq: Two times a day (BID) | ORAL | 3 refills | Status: DC

## 2017-10-05 MED ORDER — METOPROLOL TARTRATE 25 MG PO TABS: 25.0000 mg | ORAL_TABLET | Freq: Three times a day (TID) | ORAL | 3 refills | Status: DC

## 2017-10-05 MED ORDER — PEN NEEDLES 31G X 6 MM MISC: 1.0000 | Freq: Every day | 12 refills | Status: AC

## 2017-10-05 MED ORDER — METFORMIN HCL 500 MG PO TABS: 500.0000 mg | ORAL_TABLET | Freq: Two times a day (BID) | ORAL | 3 refills | Status: DC

## 2017-10-05 MED ORDER — ASPIRIN EC 81 MG PO TBEC: 81.0000 mg | DELAYED_RELEASE_TABLET | Freq: Every day | ORAL | 3 refills | Status: DC | PRN

## 2017-10-05 MED FILL — ALBUTEROL SULFATE HFA 108 (: 108 (90 BAS | 25 days supply | Qty: 18 | Fill #0

## 2017-10-05 MED FILL — HYDROCHLOROTHIAZIDE 25 MG T: 25 | 30 days supply | Qty: 30 | Fill #0

## 2017-10-05 MED FILL — ?METFORMIN HCL 500MG TABS: 500 | 30 days supply | Qty: 60 | Fill #0

## 2017-10-05 MED FILL — levETIRAcetam 500 MG TABS: 500 | 30 days supply | Qty: 60 | Fill #0

## 2017-10-05 MED FILL — TRUEPLUS PEN NDL 31G X 1/4: 31G X 6 MM | 30 days supply | Qty: 100 | Fill #0

## 2017-10-05 MED FILL — ?METOPROLOL TARTRATE 25 MG: 25 | 30 days supply | Qty: 90 | Fill #0

## 2017-10-05 MED FILL — hydrALAZINE HCL 100 MG TABS: 100 | 30 days supply | Qty: 90 | Fill #0

## 2017-10-05 MED FILL — TRUE METRIX TEST STRIP: 30 days supply | Qty: 100 | Fill #0

## 2017-10-05 MED FILL — POTASSIUM CL 10 MEQ TAB SA: 10 | 30 days supply | Qty: 30 | Fill #0

## 2017-10-05 MED FILL — AMLODIPINE BESYLATE 10 MG T: 10 | 30 days supply | Qty: 30 | Fill #0

## 2017-10-05 MED FILL — TRUEPLUS PEN NDL 31G X 1/4": 31G X 6 MM | 30 days supply | Qty: 100 | Fill #0

## 2017-10-05 NOTE — Telephone Encounter (Signed)
Refills sent into pharmacy. Thanks!  

## 2017-12-25 ENCOUNTER — Ambulatory Visit: Payer: Self-pay | Admitting: Family Medicine

## 2018-01-04 ENCOUNTER — Ambulatory Visit (INDEPENDENT_AMBULATORY_CARE_PROVIDER_SITE_OTHER): Payer: Self-pay | Admitting: Family Medicine

## 2018-01-04 ENCOUNTER — Encounter: Payer: Self-pay | Admitting: Family Medicine

## 2018-01-04 VITALS — BP 158/104 | HR 83 | Temp 98.8°F | Resp 16 | Ht 69.0 in | Wt 236.0 lb

## 2018-01-04 DIAGNOSIS — E1165 Type 2 diabetes mellitus with hyperglycemia: Secondary | ICD-10-CM

## 2018-01-04 DIAGNOSIS — I10 Essential (primary) hypertension: Secondary | ICD-10-CM

## 2018-01-04 DIAGNOSIS — Z87898 Personal history of other specified conditions: Secondary | ICD-10-CM

## 2018-01-04 LAB — POCT URINALYSIS DIPSTICK
Bilirubin, UA: NEGATIVE
Glucose, UA: NEGATIVE
Ketones, UA: NEGATIVE
Nitrite, UA: NEGATIVE
Protein, UA: POSITIVE — AB
Spec Grav, UA: 1.025 (ref 1.010–1.025)
Urobilinogen, UA: 0.2 E.U./dL
pH, UA: 6 (ref 5.0–8.0)

## 2018-01-04 LAB — POCT GLYCOSYLATED HEMOGLOBIN (HGB A1C): Hemoglobin A1C: 8.7 % — AB (ref 4.0–5.6)

## 2018-01-04 MED ORDER — HYDROCHLOROTHIAZIDE 25 MG PO TABS: 25.0000 mg | ORAL_TABLET | Freq: Every day | ORAL | 5 refills | Status: DC

## 2018-01-04 MED ORDER — LEVETIRACETAM 500 MG PO TABS: 500.0000 mg | ORAL_TABLET | Freq: Two times a day (BID) | ORAL | 3 refills | Status: DC

## 2018-01-04 MED ORDER — AMLODIPINE BESYLATE 10 MG PO TABS: 10.0000 mg | ORAL_TABLET | Freq: Every day | ORAL | 5 refills | Status: DC

## 2018-01-04 MED ORDER — HYDRALAZINE HCL 100 MG PO TABS: 100.0000 mg | ORAL_TABLET | Freq: Three times a day (TID) | ORAL | 0 refills | Status: DC

## 2018-01-04 MED ORDER — METFORMIN HCL 1000 MG PO TABS: 1000.0000 mg | ORAL_TABLET | Freq: Two times a day (BID) | ORAL | 3 refills | Status: DC

## 2018-01-04 MED ORDER — GLIPIZIDE ER 10 MG PO TB24: 10.0000 mg | ORAL_TABLET | Freq: Every day | ORAL | 5 refills | Status: DC

## 2018-01-04 MED ORDER — METOPROLOL TARTRATE 25 MG PO TABS: 25.0000 mg | ORAL_TABLET | Freq: Three times a day (TID) | ORAL | 3 refills | Status: DC

## 2018-01-04 MED ORDER — CLONIDINE HCL 0.1 MG PO TABS
0.2000 mg | ORAL_TABLET | Freq: Once | ORAL | Status: AC
  Administered 2018-01-04: 0.2 mg via ORAL

## 2018-01-04 MED FILL — hydrALAZINE HCL 100 MG TABS: 100 | 30 days supply | Qty: 90 | Fill #0

## 2018-01-04 MED FILL — glipiZIDE XL 10 MG TB24: 10 | 30 days supply | Qty: 30 | Fill #0

## 2018-01-04 MED FILL — levETIRAcetam 500 MG TABS: 500 | 30 days supply | Qty: 60 | Fill #0

## 2018-01-04 MED FILL — METOPROLOL TARTRATE 25 MG T: 25 | 30 days supply | Qty: 90 | Fill #0

## 2018-01-04 MED FILL — metFORMIN HCL 1000 MG TABS: 1000 | 30 days supply | Qty: 60 | Fill #0

## 2018-01-04 MED FILL — HYDROCHLOROTHIAZIDE 25 MG T: 25 | 30 days supply | Qty: 30 | Fill #0

## 2018-01-04 MED FILL — AMLODIPINE BESYLATE 10 MG T: 10 | 30 days supply | Qty: 30 | Fill #0

## 2018-01-04 NOTE — Patient Instructions (Addendum)
I increased your metformin to 1000mg  twice a day. I started a new medication call glipizide.    Glipizide Extended-release tablets What is this medicine? GLIPIZIDE (GLIP i zide) helps to treat type 2 diabetes. It is combined with diet and exercise. The medicine helps your body to use insulin better. This medicine may be used for other purposes; ask your health care provider or pharmacist if you have questions. COMMON BRAND NAME(S): Glucotrol XL What should I tell my health care provider before I take this medicine? They need to know if you have any of these conditions: -diabetic ketoacidosis -glucose-6-phosphate dehydrogenase deficiency -heart disease -kidney disease -liver disease -porphyria -severe infection or injury -thyroid disease -an unusual or allergic reaction to glipizide, sulfa drugs, other medicines, foods, dyes, or preservatives -pregnant or trying to get pregnant -breast-feeding How should I use this medicine? Take this medicine by mouth. Follow the directions on the prescription label. Swallow the tablets with a drink of water and take with your breakfast. Take your medicine at the same time each day. Do not take more often than directed. Talk to your pediatrician regarding the use of this medicine in children. Special care may be needed. Elderly patients over 53 years old may have a stronger reaction and need a smaller dose. Overdosage: If you think you have taken too much of this medicine contact a poison control center or emergency room at once. NOTE: This medicine is only for you. Do not share this medicine with others. What if I miss a dose? If you miss a dose, take it as soon as you can. If it is almost time for your next dose, take only that dose. Do not take double or extra doses. What may interact with this medicine? -bosentan -chloramphenicol -cisapride -medicines for fungal or yeast infections -metoclopramide -probenecid -warfarin Many medications may  cause an increase or decrease in blood sugar, these include: -alcohol containing beverages -aspirin and aspirin-like drugs -chloramphenicol -chromium -clarithromycin -male hormones, like estrogens or progestins and birth control pills -heart medicines -isoniazid -male hormones or anabolic steroids -medicines for weight loss -medicines for allergies, asthma, cold, or cough -medicines for mental problems -medicines called MAO Inhibitors like Nardil, Parnate, Marplan, Eldepryl -niacin -NSAIDs, medicines for pain and inflammation, like ibuprofen or naproxen -pentamidine -phenytoin -probenecid -quinolone antibiotics like ciprofloxacin, levofloxacin, ofloxacin -some herbal dietary supplements -steroid medicines like prednisone or cortisone -thyroid medicine -water pills or diuretics This list may not describe all possible interactions. Give your health care provider a list of all the medicines, herbs, non-prescription drugs, or dietary supplements you use. Also tell them if you smoke, drink alcohol, or use illegal drugs. Some items may interact with your medicine. What should I watch for while using this medicine? Visit your doctor or health care professional for regular checks on your progress. A test called the HbA1C (A1C) will be monitored. This is a simple blood test. It measures your blood sugar control over the last 2 to 3 months. You will receive this test every 3 to 6 months. Learn how to check your blood sugar. Learn the symptoms of low and high blood sugar and how to manage them. Always carry a quick-source of sugar with you in case you have symptoms of low blood sugar. Examples include hard sugar candy or glucose tablets. Make sure others know that you can choke if you eat or drink when you develop serious symptoms of low blood sugar, such as seizures or unconsciousness. They must get medical help at once.  Tell your doctor or health care professional if you have high blood sugar.  You might need to change the dose of your medicine. If you are sick or exercising more than usual, you might need to change the dose of your medicine. Do not skip meals. Ask your doctor or health care professional if you should avoid alcohol. Many nonprescription cough and cold products contain sugar or alcohol. These can affect blood sugar. This medicine can make you more sensitive to the sun. Keep out of the sun. If you cannot avoid being in the sun, wear protective clothing and use sunscreen. Do not use sun lamps or tanning beds/booths. Wear a medical ID bracelet or chain, and carry a card that describes your disease and details of your medicine and dosage times. What side effects may I notice from receiving this medicine? Side effects that you should report to your doctor or health care professional as soon as possible: -allergic reactions like skin rash, itching or hives, swelling of the face, lips, or tongue -breathing problems -dark urine -fever, chills, sore throat -signs and symptoms of low blood sugar such as feeling anxious, confusion, dizziness, increased hunger, unusually weak or tired, sweating, shakiness, cold, irritable, headache, blurred vision, fast heartbeat, loss of consciousness -unusual bleeding or bruising -yellowing of the eyes or skin Side effects that usually do not require medical attention (report to your doctor or health care professional if they continue or are bothersome): -diarrhea -dizziness -headache -heartburn -nausea -stomach gas This list may not describe all possible side effects. Call your doctor for medical advice about side effects. You may report side effects to FDA at 1-800-FDA-1088. Where should I keep my medicine? Keep out of the reach of children. Store at room temperature between 15 to 30 degrees C (59 to 86 degrees F). Protect from moisture and humidity. Throw away any unused medicine after the expiration date. NOTE: This sheet is a summary. It  may not cover all possible information. If you have questions about this medicine, talk to your doctor, pharmacist, or health care provider.  2018 Elsevier/Gold Standard (2012-07-07 14:32:16) DASH Eating Plan DASH stands for "Dietary Approaches to Stop Hypertension." The DASH eating plan is a healthy eating plan that has been shown to reduce high blood pressure (hypertension). It may also reduce your risk for type 2 diabetes, heart disease, and stroke. The DASH eating plan may also help with weight loss. What are tips for following this plan? General guidelines  Avoid eating more than 2,300 mg (milligrams) of salt (sodium) a day. If you have hypertension, you may need to reduce your sodium intake to 1,500 mg a day.  Limit alcohol intake to no more than 1 drink a day for nonpregnant women and 2 drinks a day for men. One drink equals 12 oz of beer, 5 oz of wine, or 1 oz of hard liquor.  Work with your health care provider to maintain a healthy body weight or to lose weight. Ask what an ideal weight is for you.  Get at least 30 minutes of exercise that causes your heart to beat faster (aerobic exercise) most days of the week. Activities may include walking, swimming, or biking.  Work with your health care provider or diet and nutrition specialist (dietitian) to adjust your eating plan to your individual calorie needs. Reading food labels  Check food labels for the amount of sodium per serving. Choose foods with less than 5 percent of the Daily Value of sodium. Generally, foods with less  than 300 mg of sodium per serving fit into this eating plan.  To find whole grains, look for the word "whole" as the first word in the ingredient list. Shopping  Buy products labeled as "low-sodium" or "no salt added."  Buy fresh foods. Avoid canned foods and premade or frozen meals. Cooking  Avoid adding salt when cooking. Use salt-free seasonings or herbs instead of table salt or sea salt. Check with your  health care provider or pharmacist before using salt substitutes.  Do not fry foods. Cook foods using healthy methods such as baking, boiling, grilling, and broiling instead.  Cook with heart-healthy oils, such as olive, canola, soybean, or sunflower oil. Meal planning   Eat a balanced diet that includes: ? 5 or more servings of fruits and vegetables each day. At each meal, try to fill half of your plate with fruits and vegetables. ? Up to 6-8 servings of whole grains each day. ? Less than 6 oz of lean meat, poultry, or fish each day. A 3-oz serving of meat is about the same size as a deck of cards. One egg equals 1 oz. ? 2 servings of low-fat dairy each day. ? A serving of nuts, seeds, or beans 5 times each week. ? Heart-healthy fats. Healthy fats called Omega-3 fatty acids are found in foods such as flaxseeds and coldwater fish, like sardines, salmon, and mackerel.  Limit how much you eat of the following: ? Canned or prepackaged foods. ? Food that is high in trans fat, such as fried foods. ? Food that is high in saturated fat, such as fatty meat. ? Sweets, desserts, sugary drinks, and other foods with added sugar. ? Full-fat dairy products.  Do not salt foods before eating.  Try to eat at least 2 vegetarian meals each week.  Eat more home-cooked food and less restaurant, buffet, and fast food.  When eating at a restaurant, ask that your food be prepared with less salt or no salt, if possible. What foods are recommended? The items listed may not be a complete list. Talk with your dietitian about what dietary choices are best for you. Grains Whole-grain or whole-wheat bread. Whole-grain or whole-wheat pasta. Brown rice. Orpah Cobb. Bulgur. Whole-grain and low-sodium cereals. Pita bread. Low-fat, low-sodium crackers. Whole-wheat flour tortillas. Vegetables Fresh or frozen vegetables (raw, steamed, roasted, or grilled). Low-sodium or reduced-sodium tomato and vegetable juice.  Low-sodium or reduced-sodium tomato sauce and tomato paste. Low-sodium or reduced-sodium canned vegetables. Fruits All fresh, dried, or frozen fruit. Canned fruit in natural juice (without added sugar). Meat and other protein foods Skinless chicken or [place]. Ground chicken or [place]. Pork with fat trimmed off. Fish and seafood. Egg whites. Dried beans, peas, or lentils. Unsalted nuts, nut butters, and seeds. Unsalted canned beans. Lean cuts of beef with fat trimmed off. Low-sodium, lean deli meat. Dairy Low-fat (1%) or fat-free (skim) milk. Fat-free, low-fat, or reduced-fat cheeses. Nonfat, low-sodium ricotta or cottage cheese. Low-fat or nonfat yogurt. Low-fat, low-sodium cheese. Fats and oils Soft margarine without trans fats. Vegetable oil. Low-fat, reduced-fat, or light mayonnaise and salad dressings (reduced-sodium). Canola, safflower, olive, soybean, and sunflower oils. Avocado. Seasoning and other foods Herbs. Spices. Seasoning mixes without salt. Unsalted popcorn and pretzels. Fat-free sweets. What foods are not recommended? The items listed may not be a complete list. Talk with your dietitian about what dietary choices are best for you. Grains Baked goods made with fat, such as croissants, muffins, or some breads. Dry pasta or rice meal packs.  Vegetables Creamed or fried vegetables. Vegetables in a cheese sauce. Regular canned vegetables (not low-sodium or reduced-sodium). Regular canned tomato sauce and paste (not low-sodium or reduced-sodium). Regular tomato and vegetable juice (not low-sodium or reduced-sodium). Rosita Fire. Olives. Fruits Canned fruit in a light or heavy syrup. Fried fruit. Fruit in cream or butter sauce. Meat and other protein foods Fatty cuts of meat. Ribs. Fried meat. Tomasa Blase. Sausage. Bologna and other processed lunch meats. Salami. Fatback. Hotdogs. Bratwurst. Salted nuts and seeds. Canned beans with added salt. Canned or smoked fish. Whole eggs or egg yolks. Chicken  or [place] with skin. Dairy Whole or 2% milk, cream, and half-and-half. Whole or full-fat cream cheese. Whole-fat or sweetened yogurt. Full-fat cheese. Nondairy creamers. Whipped toppings. Processed cheese and cheese spreads. Fats and oils Butter. Stick margarine. Lard. Shortening. Ghee. Bacon fat. Tropical oils, such as coconut, palm kernel, or palm oil. Seasoning and other foods Salted popcorn and pretzels. Onion salt, garlic salt, seasoned salt, table salt, and sea salt. Worcestershire sauce. Tartar sauce. Barbecue sauce. Teriyaki sauce. Soy sauce, including reduced-sodium. Steak sauce. Canned and packaged gravies. Fish sauce. Oyster sauce. Cocktail sauce. Horseradish that you find on the shelf. Ketchup. Mustard. Meat flavorings and tenderizers. Bouillon cubes. Hot sauce and Tabasco sauce. Premade or packaged marinades. Premade or packaged taco seasonings. Relishes. Regular salad dressings. Where to find more information:  National Heart, Lung, and Blood Institute: PopSteam.is  American Heart Association: www.heart.org Summary  The DASH eating plan is a healthy eating plan that has been shown to reduce high blood pressure (hypertension). It may also reduce your risk for type 2 diabetes, heart disease, and stroke.  With the DASH eating plan, you should limit salt (sodium) intake to 2,300 mg a day. If you have hypertension, you may need to reduce your sodium intake to 1,500 mg a day.  When on the DASH eating plan, aim to eat more fresh fruits and vegetables, whole grains, lean proteins, low-fat dairy, and heart-healthy fats.  Work with your health care provider or diet and nutrition specialist (dietitian) to adjust your eating plan to your individual calorie needs. This information is not intended to replace advice given to you by your health care provider. Make sure you discuss any questions you have with your health care provider. Document Released: 03/13/2011 Document Revised:  03/17/2016 Document Reviewed: 03/17/2016 Elsevier Interactive Patient Education  [employer].

## 2018-01-04 NOTE — Progress Notes (Signed)
[facility]  Provider: Mike Gip, FNP   Hypertension Follow Up Visit  SUBJECTIVE:  Micheal Levy is a 37 y.o. male who  has a past medical history of Chronic kidney disease, Diabetes mellitus without complication (HCC), Hypertension, Seizures (HCC) (03/2017), Stroke (HCC) (03/2017), and Substance abuse (HCC).   New Concerns: Patient presents today with elevated BP reading. Has not taken BP medications this AM due to running out. Patient states that he is out of metoprolol, HCTZ, and amlodipine for the past week.  Patient states that he took hydralazine a few minutes before coming to this appt.  Given 0.2 mg clonidine @1130 .   Patient states that he had an eye exam in April 2019.  Patient states that he does eat fast food 3-4 times per week. Patient denies exercising. Patient also reports a weight gain. Patient was d/c'd from insulin due to improving A1C. A1c has increased at todays visit. Patient is not checking FBS.  Current Outpatient Medications  Medication Sig Dispense Refill  . amLODipine (NORVASC) 10 MG tablet Take 1 tablet (10 mg total) by mouth daily. 30 tablet 5  . aspirin EC 81 MG tablet Take 1 tablet (81 mg total) by mouth daily as needed (pain/headache). 30 tablet 3  . Blood Glucose Monitoring Suppl (TRUE METRIX AIR GLUCOSE METER) DEVI 1 each by Does not apply route 4 (four) times daily -  before meals and at bedtime. 1 Device 0  . glucose blood (TRUE METRIX BLOOD GLUCOSE TEST) test strip Use as instructed 100 each 12  . hydrALAZINE (APRESOLINE) 100 MG tablet Take 1 tablet (100 mg total) by mouth 3 (three) times daily. 180 tablet 0  . hydrochlorothiazide (HYDRODIURIL) 25 MG tablet Take 1 tablet (25 mg total) by mouth daily. 30 tablet 5  . Insulin Pen Needle (PEN NEEDLES) 31G X 6 MM MISC 1 each by Does not apply route at bedtime. 100 each 12  . INSULIN SYRINGE .5CC/29G (B-D INS SYR ULTRAFINE .5CC/29G) 29G X 1/2" 0.5 ML  MISC 1 each by Does not apply route at bedtime. 100 each 5  . Lancets MISC 1 each by Does not apply route 4 (four) times daily -  before meals and at bedtime. 100 each 5  . levETIRAcetam (KEPPRA) 500 MG tablet Take 1 tablet (500 mg total) by mouth 2 (two) times daily. 60 tablet 3  . metoprolol tartrate (LOPRESSOR) 25 MG tablet Take 1 tablet (25 mg total) by mouth 3 (three) times daily. 90 tablet 3  . potassium chloride (K-DUR) 10 MEQ tablet Take 1 tablet (10 mEq total) by mouth daily. 30 tablet 3  . albuterol (PROVENTIL HFA;VENTOLIN HFA) 108 (90 Base) MCG/ACT inhaler Inhale 2 puffs into the lungs every 6 (six) hours as needed for wheezing or shortness of breath. (Patient not taking: Reported on 01/04/2018) 1 Inhaler 3  . albuterol (PROVENTIL) (2.5 MG/3ML) 0.083% nebulizer solution Take 3 mLs (2.5 mg total) by nebulization every 6 (six) hours as needed for wheezing or shortness of breath. (Patient not taking: Reported on 01/04/2018) 75 mL 12  . albuterol (PROVENTIL) (2.5 MG/3ML) 0.083% nebulizer solution Take 3 mLs (2.5 mg total) by nebulization every 6 (six) hours as needed for wheezing or shortness of breath. (Patient not taking: Reported on 01/04/2018) 75 mL 12  . glipiZIDE (GLUCOTROL XL) 10 MG 24 hr tablet Take 1 tablet (10 mg total) by mouth daily with breakfast. 30 tablet 5  . Insulin Glargine (LANTUS SOLOSTAR)  100 UNIT/ML Solostar Pen Inject 20 Units into the skin daily at 10 pm. (Patient not taking: Reported on 01/04/2018) 5 pen 11  . metFORMIN (GLUCOPHAGE) 1000 MG tablet Take 1 tablet (1,000 mg total) by mouth 2 (two) times daily with a meal. 180 tablet 3   No current facility-administered medications for this visit.     Recent Results (from the past 2160 hour(s))  HgB A1c     Status: Abnormal   Collection Time: 01/04/18 11:43 AM  Result Value Ref Range   Hemoglobin A1C 8.7 (A) 4.0 - 5.6 %   HbA1c POC (<> result, manual entry)     HbA1c, POC (prediabetic range)     HbA1c, POC (controlled  diabetic range)    Urinalysis Dipstick     Status: Abnormal   Collection Time: 01/04/18 11:44 AM  Result Value Ref Range   Color, UA     Clarity, UA     Glucose, UA Negative Negative   Bilirubin, UA neg    Ketones, UA neg    Spec Grav, UA 1.025 1.010 - 1.025   Blood, UA trace    pH, UA 6.0 5.0 - 8.0   Protein, UA Positive (A) Negative    Comment: 100   Urobilinogen, UA 0.2 0.2 or 1.0 E.U./dL   Nitrite, UA neg    Leukocytes, UA Small (1+) (A) Negative   Appearance     Odor    Comprehensive metabolic panel     Status: Abnormal   Collection Time: 01/04/18  1:27 PM  Result Value Ref Range   Glucose 191 (H) 65 - 99 mg/dL   BUN 13 6 - 20 mg/dL   Creatinine, Ser 1.61 (H) 0.76 - 1.27 mg/dL   GFR calc non Af Amer 69 >59 mL/min/1.73   GFR calc Af Amer 80 >59 mL/min/1.73   BUN/Creatinine Ratio 10 9 - 20   Sodium 140 134 - 144 mmol/L   Potassium 3.6 3.5 - 5.2 mmol/L   Chloride 97 96 - 106 mmol/L   CO2 27 20 - 29 mmol/L   Calcium 9.6 8.7 - 10.2 mg/dL   Total Protein 6.7 6.0 - 8.5 g/dL   Albumin 4.2 3.5 - 5.5 g/dL   Globulin, Total 2.5 1.5 - 4.5 g/dL   Albumin/Globulin Ratio 1.7 1.2 - 2.2   Bilirubin Total 0.4 0.0 - 1.2 mg/dL   Alkaline Phosphatase 80 39 - 117 IU/L   AST 21 0 - 40 IU/L   ALT 25 0 - 44 IU/L    Hypertension ROS: Review of Systems  Constitutional: Negative.   HENT: Negative.   Eyes: Negative.   Respiratory: Negative.   Cardiovascular: Negative.   Gastrointestinal: Negative.   Genitourinary: Negative.   Musculoskeletal: Negative.   Skin: Negative.   Neurological: Negative.   Psychiatric/Behavioral: Negative.      OBJECTIVE:   BP (!) 158/104 Comment: manually  Pulse 83   Temp 98.8 F (37.1 C) (Oral)   Resp 16   Ht 5\' 9"  (1.753 m)   Wt 236 lb (107 kg)   SpO2 99%   BMI 34.85 kg/m   Physical Exam  Constitutional: He is oriented to person, place, and time. He appears well-developed and well-nourished. No distress.  HENT:  Head: Normocephalic and  atraumatic.  Eyes: Pupils are equal, round, and reactive to light. Conjunctivae and EOM are normal.  Neck: Normal range of motion. Neck supple. No JVD present.  Cardiovascular: Normal rate, regular rhythm, normal heart sounds and intact distal  pulses.  No murmur heard. Pulmonary/Chest: Effort normal and breath sounds normal. No respiratory distress.  Abdominal: Soft. He exhibits no distension.  Musculoskeletal: Normal range of motion. He exhibits no edema.  Lymphadenopathy:    He has no cervical adenopathy.  Neurological: He is alert and oriented to person, place, and time.  Skin: Skin is warm and dry.  Psychiatric: He has a normal mood and affect. His behavior is normal. Judgment and thought content normal.  Nursing note and vitals reviewed.    ASSESSMENT/PLAN:   1. Essential hypertension Refilled medications. Discussed low sodium diet and limiting fast food.  - Urinalysis Dipstick - cloNIDine (CATAPRES) tablet 0.2 mg - Comprehensive metabolic panel - metoprolol tartrate (LOPRESSOR) 25 MG tablet; Take 1 tablet (25 mg total) by mouth 3 (three) times daily.  Dispense: 90 tablet; Refill: 3 - hydrochlorothiazide (HYDRODIURIL) 25 MG tablet; Take 1 tablet (25 mg total) by mouth daily.  Dispense: 30 tablet; Refill: 5 - hydrALAZINE (APRESOLINE) 100 MG tablet; Take 1 tablet (100 mg total) by mouth 3 (three) times daily.  Dispense: 180 tablet; Refill: 0 - amLODipine (NORVASC) 10 MG tablet; Take 1 tablet (10 mg total) by mouth daily.  Dispense: 30 tablet; Refill: 5  2. Uncontrolled type 2 diabetes mellitus with hyperglycemia (HCC) Added glipizide. If not improvement, will need to restart insulin.  - HgB A1c - Comprehensive metabolic panel - metFORMIN (GLUCOPHAGE) 1000 MG tablet; Take 1 tablet (1,000 mg total) by mouth 2 (two) times daily with a meal.  Dispense: 180 tablet; Refill: 3 - glipiZIDE (GLUCOTROL XL) 10 MG 24 hr tablet; Take 1 tablet (10 mg total) by mouth daily with breakfast.   Dispense: 30 tablet; Refill: 5  3. History of seizure Continue with current medications.  - levETIRAcetam (KEPPRA) 500 MG tablet; Take 1 tablet (500 mg total) by mouth 2 (two) times daily.  Dispense: 60 tablet; Refill: 3   The patient is asked to make an attempt to improve diet and exercise patterns to aid in medical management of this problem.  Return to care as scheduled and prn. Patient verbalized understanding and agreed with plan of care.    Ms. Freda Jackson. Riley Lam, FNP-BC [facility] [address] [place], [place] [address] [phone]

## 2018-01-05 LAB — COMPREHENSIVE METABOLIC PANEL
ALT: 25 IU/L (ref 0–44)
AST: 21 IU/L (ref 0–40)
Albumin/Globulin Ratio: 1.7 (ref 1.2–2.2)
Albumin: 4.2 g/dL (ref 3.5–5.5)
Alkaline Phosphatase: 80 IU/L (ref 39–117)
BUN/Creatinine Ratio: 10 (ref 9–20)
BUN: 13 mg/dL (ref 6–20)
Bilirubin Total: 0.4 mg/dL (ref 0.0–1.2)
CO2: 27 mmol/L (ref 20–29)
Calcium: 9.6 mg/dL (ref 8.7–10.2)
Chloride: 97 mmol/L (ref 96–106)
Creatinine, Ser: 1.29 mg/dL — ABNORMAL HIGH (ref 0.76–1.27)
GFR calc Af Amer: 80 mL/min/{1.73_m2} (ref >59)
GFR calc non Af Amer: 69 mL/min/{1.73_m2} (ref >59)
Globulin, Total: 2.5 g/dL (ref 1.5–4.5)
Glucose: 191 mg/dL — ABNORMAL HIGH (ref 65–99)
Potassium: 3.6 mmol/L (ref 3.5–5.2)
Sodium: 140 mmol/L (ref 134–144)
Total Protein: 6.7 g/dL (ref 6.0–8.5)

## 2018-02-26 MED FILL — METOPROLOL TARTRATE 25 MG T: 25 | 30 days supply | Qty: 90 | Fill #1

## 2018-02-26 MED FILL — glipiZIDE XL 10 MG TB24: 10 | 30 days supply | Qty: 30 | Fill #1

## 2018-02-26 MED FILL — levETIRAcetam 500 MG TABS: 500 | 30 days supply | Qty: 60 | Fill #1

## 2018-02-26 MED FILL — hydrALAZINE HCL 100 MG TABS: 100 | 30 days supply | Qty: 90 | Fill #1

## 2018-02-26 MED FILL — metFORMIN HCL 1000 MG TABS: 1000 | 30 days supply | Qty: 60 | Fill #1

## 2018-02-26 MED FILL — HYDROCHLOROTHIAZIDE 25 MG T: 25 | 30 days supply | Qty: 30 | Fill #1

## 2018-02-26 MED FILL — AMLODIPINE BESYLATE 10 MG T: 10 | 30 days supply | Qty: 30 | Fill #1

## 2018-04-05 ENCOUNTER — Ambulatory Visit: Payer: Self-pay | Admitting: Family Medicine

## 2018-12-15 ENCOUNTER — Encounter (HOSPITAL_COMMUNITY): Payer: Self-pay | Admitting: *Deleted

## 2018-12-15 ENCOUNTER — Encounter (HOSPITAL_COMMUNITY): Payer: Self-pay

## 2019-03-23 ENCOUNTER — Other Ambulatory Visit: Payer: Self-pay

## 2019-03-23 ENCOUNTER — Emergency Department (HOSPITAL_COMMUNITY)
Admission: EM | Admit: 2019-03-23 | Discharge: 2019-03-23 | Attending: Emergency Medicine | Admitting: Emergency Medicine

## 2019-03-23 ENCOUNTER — Encounter (HOSPITAL_COMMUNITY): Payer: Self-pay

## 2019-03-23 DIAGNOSIS — I129 Hypertensive chronic kidney disease with stage 1 through stage 4 chronic kidney disease, or unspecified chronic kidney disease: Secondary | ICD-10-CM | POA: Insufficient documentation

## 2019-03-23 DIAGNOSIS — N189 Chronic kidney disease, unspecified: Secondary | ICD-10-CM | POA: Diagnosis not present

## 2019-03-23 DIAGNOSIS — Z87891 Personal history of nicotine dependence: Secondary | ICD-10-CM | POA: Diagnosis not present

## 2019-03-23 DIAGNOSIS — Z794 Long term (current) use of insulin: Secondary | ICD-10-CM | POA: Diagnosis not present

## 2019-03-23 DIAGNOSIS — Z7982 Long term (current) use of aspirin: Secondary | ICD-10-CM | POA: Diagnosis not present

## 2019-03-23 DIAGNOSIS — Z8673 Personal history of transient ischemic attack (TIA), and cerebral infarction without residual deficits: Secondary | ICD-10-CM | POA: Diagnosis not present

## 2019-03-23 DIAGNOSIS — I10 Essential (primary) hypertension: Secondary | ICD-10-CM

## 2019-03-23 DIAGNOSIS — E1122 Type 2 diabetes mellitus with diabetic chronic kidney disease: Secondary | ICD-10-CM | POA: Diagnosis not present

## 2019-03-23 DIAGNOSIS — Z79899 Other long term (current) drug therapy: Secondary | ICD-10-CM | POA: Insufficient documentation

## 2019-03-23 MED ORDER — AMLODIPINE BESYLATE 10 MG PO TABS: 10.0000 mg | ORAL_TABLET | Freq: Every day | ORAL | 0 refills | Status: DC

## 2019-03-23 MED ORDER — METOPROLOL TARTRATE 50 MG PO TABS: 50.0000 mg | ORAL_TABLET | Freq: Two times a day (BID) | ORAL | 0 refills | Status: DC

## 2019-03-23 MED ORDER — AMLODIPINE BESYLATE 5 MG PO TABS
10.0000 mg | ORAL_TABLET | Freq: Once | ORAL | Status: AC
  Administered 2019-03-23: 10 mg via ORAL
  Filled 2019-03-23: qty 2

## 2019-03-23 MED ORDER — METOPROLOL TARTRATE 25 MG PO TABS
25.0000 mg | ORAL_TABLET | Freq: Once | ORAL | Status: AC
  Administered 2019-03-23: 25 mg via ORAL
  Filled 2019-03-23: qty 1

## 2019-03-23 NOTE — ED Triage Notes (Addendum)
[facility]. Jail called EMS for high BP. Patient has no complaints. Pt has hx of HTN, strokes, diabetes.  EMS reports BP 258/152 HR 120 98% RA  CBG 170

## 2019-03-23 NOTE — ED Notes (Signed)
Dr. Wilson Singer aware BP 207/129, MD states patient is medically cleared for discharge. An After Visit Summary was printed and given to the patient. Discharge instructions and prescriptions reviewed with patient, patient has no further questions at this time. Patient has no complaints. Patient leaving with police escort.

## 2019-03-23 NOTE — ED Notes (Signed)
ED Provider at bedside. 

## 2019-03-23 NOTE — ED Provider Notes (Signed)
[facility] Provider Note   CSN: [id number] Arrival date & time: 03/23/19  1556     History Chief Complaint  Patient presents with  . Hypertension    Micheal Levy is a 38 y.o. male.  HPI   38 year old male with hypertension.  He denies any complaints.  He is currently incarcerated.  Apparently he had a routine check today and he was found to be extremely hypertensive so he is sent to the emergency room for evaluation.  Again, he denies any specific complaints.  He denies any acute pain anywhere.  No changes in vision.  No dyspnea.  No unusual swelling.  No issues with urination.  He reports that he is currently only taking hydrochlorothiazide and Metformin.  He was previously on other medications for his hypertension but is not sure what specifically.  Past Medical History:  Diagnosis Date  . Chronic kidney disease   . Diabetes mellitus without complication (HCC)   . Hypertension   . Seizures (HCC) 03/2017   x 2   . Stroke (HCC) 03/2017  . Substance abuse [facility](HCC)     Patient Active Problem List   Diagnosis Date Noted  . History of substance abuse (HCC) 04/05/2017  . Cerebral embolism with cerebral infarction 03/23/2017  . Acute renal failure (ARF) (HCC)   . Hypertensive emergency   . Ventilator dependent (HCC)   . Hyperglycemia 03/21/2017    History reviewed. No pertinent surgical history.     Family History  Problem Relation Age of Onset  . Diabetes Father   . Heart attack Father   . Diabetes Sister   . Hypertension Brother     Social History   Tobacco Use  . Smoking status: Former Smoker    Types: Cigarettes    Quit date: 04/25/1999    Years since quitting: 19.9  . Smokeless tobacco: Never Used  . Tobacco comment: 2001  Substance Use Topics  . Alcohol use: Yes    Comment: occ, none since Dec 2018  . Drug use: Yes    Types: Cocaine, Marijuana    Comment: weed-daily. cocaine- once or twice a month, 05/25/17 avg 1/2  gm daily, no cocaine since Dec 2018     Home Medications Prior to Admission medications   Medication Sig Start Date End Date Taking? Authorizing Provider  albuterol (PROVENTIL HFA;VENTOLIN HFA) 108 (90 Base) MCG/ACT inhaler Inhale 2 puffs into the lungs every 6 (six) hours as needed for wheezing or shortness of breath. Patient not taking: Reported on 01/04/2018 10/05/17   Massie MaroonHollis, Lachina M, FNP  albuterol (PROVENTIL) (2.5 MG/3ML) 0.083% nebulizer solution Take 3 mLs (2.5 mg total) by nebulization every 6 (six) hours as needed for wheezing or shortness of breath. Patient not taking: Reported on 01/04/2018 06/17/17   Massie MaroonHollis, Lachina M, FNP  albuterol (PROVENTIL) (2.5 MG/3ML) 0.083% nebulizer solution Take 3 mLs (2.5 mg total) by nebulization every 6 (six) hours as needed for wheezing or shortness of breath. Patient not taking: Reported on 01/04/2018 06/17/17   Massie MaroonHollis, Lachina M, FNP  amLODipine (NORVASC) 10 MG tablet Take 1 tablet (10 mg total) by mouth daily. 01/04/18   Mike Gipouglas, Andre, FNP  amLODipine (NORVASC) 10 MG tablet Take 1 tablet (10 mg total) by mouth daily. 03/23/19   Raeford RazorKohut, Thelma Viana, MD  aspirin EC 81 MG tablet Take 1 tablet (81 mg total) by mouth daily as needed (pain/headache). 10/05/17   Massie MaroonHollis, Lachina M, FNP  Blood Glucose Monitoring Suppl (TRUE METRIX AIR GLUCOSE  METER) DEVI 1 each by Does not apply route 4 (four) times daily -  before meals and at bedtime. 04/02/17   Massie Maroon, FNP  glipiZIDE (GLUCOTROL XL) 10 MG 24 hr tablet Take 1 tablet (10 mg total) by mouth daily with breakfast. 01/04/18   Mike Gip, FNP  glucose blood (TRUE METRIX BLOOD GLUCOSE TEST) test strip Use as instructed 10/05/17   Massie Maroon, FNP  hydrALAZINE (APRESOLINE) 100 MG tablet Take 1 tablet (100 mg total) by mouth 3 (three) times daily. 01/04/18   Mike Gip, FNP  hydrochlorothiazide (HYDRODIURIL) 25 MG tablet Take 1 tablet (25 mg total) by mouth daily. 01/04/18   Mike Gip, FNP  Insulin  Glargine (LANTUS SOLOSTAR) 100 UNIT/ML Solostar Pen Inject 20 Units into the skin daily at 10 pm. Patient not taking: Reported on 01/04/2018 10/05/17   Massie Maroon, FNP  Insulin Pen Needle (PEN NEEDLES) 31G X 6 MM MISC 1 each by Does not apply route at bedtime. 10/05/17   Massie Maroon, FNP  INSULIN SYRINGE .5CC/29G (B-D INS SYR ULTRAFINE .5CC/29G) 29G X 1/2" 0.5 ML MISC 1 each by Does not apply route at bedtime. 04/02/17   Massie Maroon, FNP  Lancets MISC 1 each by Does not apply route 4 (four) times daily -  before meals and at bedtime. 04/02/17   Massie Maroon, FNP  levETIRAcetam (KEPPRA) 500 MG tablet Take 1 tablet (500 mg total) by mouth 2 (two) times daily. 01/04/18   Mike Gip, FNP  metFORMIN (GLUCOPHAGE) 1000 MG tablet Take 1 tablet (1,000 mg total) by mouth 2 (two) times daily with a meal. 01/04/18   Mike Gip, FNP  metoprolol tartrate (LOPRESSOR) 25 MG tablet Take 1 tablet (25 mg total) by mouth 3 (three) times daily. 01/04/18   Mike Gip, FNP  metoprolol tartrate (LOPRESSOR) 50 MG tablet Take 1 tablet (50 mg total) by mouth 2 (two) times daily. 03/23/19   Raeford Razor, MD  potassium chloride (K-DUR) 10 MEQ tablet Take 1 tablet (10 mEq total) by mouth daily. 10/05/17   Massie Maroon, FNP    Allergies    Patient has no known allergies.  Review of Systems   Review of Systems All systems reviewed and negative, other than as noted in HPI.   Physical Exam Updated Vital Signs BP (!) 237/139 (BP Location: Right Arm) Comment: Simultaneous filing. User may not have seen previous data.  Pulse (!) 109 Comment: Simultaneous filing. User may not have seen previous data.  Temp 97.9 F (36.6 C) (Oral)   Resp (!) 29 Comment: Simultaneous filing. User may not have seen previous data.  SpO2 99% Comment: Simultaneous filing. User may not have seen previous data.  Physical Exam Vitals and nursing note reviewed.  Constitutional:      General: He is not in acute  distress.    Appearance: He is well-developed.  HENT:     Head: Normocephalic and atraumatic.  Eyes:     General:        Right eye: No discharge.        Left eye: No discharge.     Conjunctiva/sclera: Conjunctivae normal.  Cardiovascular:     Rate and Rhythm: Normal rate and regular rhythm.     Heart sounds: Normal heart sounds. No murmur. No friction rub. No gallop.   Pulmonary:     Effort: Pulmonary effort is normal. No respiratory distress.     Breath sounds: Normal breath sounds.  Abdominal:  General: There is no distension.     Palpations: Abdomen is soft.     Tenderness: There is no abdominal tenderness.  Musculoskeletal:        General: No tenderness.     Cervical back: Neck supple.  Skin:    General: Skin is warm and dry.  Neurological:     Mental Status: He is alert and oriented to person, place, and time.     Cranial Nerves: No cranial nerve deficit.     Sensory: No sensory deficit.     Motor: No weakness.  Psychiatric:        Behavior: Behavior normal.        Thought Content: Thought content normal.     ED Results / Procedures / Treatments   Labs (all labs ordered are listed, but only abnormal results are displayed) Labs Reviewed - No data to display  EKG EKG Interpretation  Date/Time:  Wednesday March 23 2019 16:00:23 EST Ventricular Rate:  109 PR Interval:    QRS Duration: 88 QT Interval:  356 QTC Calculation: 480 R Axis:   11 Text Interpretation: Sinus tachycardia LAE, consider biatrial enlargement Probable LVH with secondary repol abnrm Borderline prolonged QT interval Confirmed by Virgel Manifold [phone]) on 03/23/2019 4:10:51 PM   Radiology No results found.  Procedures Procedures (including critical care time)  Medications Ordered in ED Medications  metoprolol tartrate (LOPRESSOR) tablet 25 mg (25 mg Oral Given 03/23/19 1627)  amLODipine (NORVASC) tablet 10 mg (10 mg Oral Given 03/23/19 1628)    ED Course  I have reviewed the  triage vital signs and the nursing notes.  Pertinent labs & imaging results that were available during my care of the patient were reviewed by me and considered in my medical decision making (see chart for details).    MDM Rules/Calculators/A&P  38 year old male with hypertension.  No emergent indication for acute reduction.  Per review of records he was previously on numerous different agents.  This includes metoprolol, hydrochlorothiazide, amlodipine and hydrochlorothiazide.  He is currently only on the hydrochlorothiazide.  He is completely asymptomatic.  We will give a dose of amlodipine metoprolol here in the emergency room we will provide prescriptions for the same.  He is currently incarcerated but states that he is going to be released this upcoming Monday.  He states that he has established outpatient follow-up.  He was advised to do this as soon as he reasonably can.  Emergent return precautions were discussed.  Outpatient follow-up otherwise.  Final Clinical Impression(s) / ED Diagnoses Final diagnoses:  Hypertension, unspecified type    Rx / DC Orders ED Discharge Orders         Ordered    amLODipine (NORVASC) 10 MG tablet  Daily     03/23/19 1625    metoprolol tartrate (LOPRESSOR) 50 MG tablet  2 times daily     03/23/19 1625           Virgel Manifold, MD 03/23/19 [phone]

## 2019-04-08 DIAGNOSIS — E559 Vitamin D deficiency, unspecified: Secondary | ICD-10-CM

## 2019-04-08 DIAGNOSIS — E789 Disorder of lipoprotein metabolism, unspecified: Secondary | ICD-10-CM

## 2019-04-08 DIAGNOSIS — E7800 Pure hypercholesterolemia, unspecified: Secondary | ICD-10-CM

## 2019-04-08 DIAGNOSIS — R7989 Other specified abnormal findings of blood chemistry: Secondary | ICD-10-CM

## 2019-04-08 DIAGNOSIS — I160 Hypertensive urgency: Secondary | ICD-10-CM

## 2019-04-08 HISTORY — DX: Disorder of lipoprotein metabolism, unspecified: E78.9

## 2019-04-08 HISTORY — DX: Pure hypercholesterolemia, unspecified: E78.00

## 2019-04-08 HISTORY — DX: Other specified abnormal findings of blood chemistry: R79.89

## 2019-04-08 HISTORY — DX: Hypertensive urgency: I16.0

## 2019-04-08 HISTORY — DX: Vitamin D deficiency, unspecified: E55.9

## 2019-04-19 ENCOUNTER — Ambulatory Visit (INDEPENDENT_AMBULATORY_CARE_PROVIDER_SITE_OTHER): Admitting: Family Medicine

## 2019-04-19 ENCOUNTER — Encounter: Payer: Self-pay | Admitting: Family Medicine

## 2019-04-19 ENCOUNTER — Other Ambulatory Visit: Payer: Self-pay

## 2019-04-19 VITALS — BP 207/134 | HR 102 | Temp 98.0°F | Ht 69.0 in | Wt 236.6 lb

## 2019-04-19 DIAGNOSIS — E1165 Type 2 diabetes mellitus with hyperglycemia: Secondary | ICD-10-CM

## 2019-04-19 DIAGNOSIS — I10 Essential (primary) hypertension: Secondary | ICD-10-CM | POA: Diagnosis not present

## 2019-04-19 DIAGNOSIS — Z0000 Encounter for general adult medical examination without abnormal findings: Secondary | ICD-10-CM | POA: Diagnosis not present

## 2019-04-19 DIAGNOSIS — E119 Type 2 diabetes mellitus without complications: Secondary | ICD-10-CM

## 2019-04-19 DIAGNOSIS — Z09 Encounter for follow-up examination after completed treatment for conditions other than malignant neoplasm: Secondary | ICD-10-CM

## 2019-04-19 DIAGNOSIS — E876 Hypokalemia: Secondary | ICD-10-CM

## 2019-04-19 DIAGNOSIS — I160 Hypertensive urgency: Secondary | ICD-10-CM | POA: Diagnosis not present

## 2019-04-19 DIAGNOSIS — R739 Hyperglycemia, unspecified: Secondary | ICD-10-CM

## 2019-04-19 DIAGNOSIS — R7303 Prediabetes: Secondary | ICD-10-CM | POA: Diagnosis not present

## 2019-04-19 DIAGNOSIS — Z87898 Personal history of other specified conditions: Secondary | ICD-10-CM

## 2019-04-19 LAB — POCT URINALYSIS DIPSTICK
Bilirubin, UA: NEGATIVE
Blood, UA: NEGATIVE
Glucose, UA: NEGATIVE
Ketones, UA: NEGATIVE
Nitrite, UA: NEGATIVE
Protein, UA: POSITIVE — AB
Spec Grav, UA: 1.025 (ref 1.010–1.025)
Urobilinogen, UA: 0.2 E.U./dL
pH, UA: 6.5 (ref 5.0–8.0)

## 2019-04-19 LAB — POCT GLYCOSYLATED HEMOGLOBIN (HGB A1C): Hemoglobin A1C: 7.9 % — AB (ref 4.0–5.6)

## 2019-04-19 LAB — GLUCOSE, POCT (MANUAL RESULT ENTRY): POC Glucose: 180 mg/dl — AB (ref 70–99)

## 2019-04-19 MED ORDER — METOPROLOL TARTRATE 50 MG PO TABS: 50.0000 mg | ORAL_TABLET | Freq: Two times a day (BID) | ORAL | 3 refills | Status: DC

## 2019-04-19 MED ORDER — ALBUTEROL SULFATE (2.5 MG/3ML) 0.083% IN NEBU: 2.5000 mg | INHALATION_SOLUTION | Freq: Four times a day (QID) | RESPIRATORY_TRACT | 12 refills | Status: DC | PRN

## 2019-04-19 MED ORDER — LANCETS MISC: 1.0000 | Freq: Three times a day (TID) | 5 refills | Status: AC

## 2019-04-19 MED ORDER — METFORMIN HCL 1000 MG PO TABS: 1000.0000 mg | ORAL_TABLET | Freq: Two times a day (BID) | ORAL | 3 refills | Status: DC

## 2019-04-19 MED ORDER — CLONIDINE HCL 0.1 MG PO TABS
0.1000 mg | ORAL_TABLET | Freq: Once | ORAL | Status: AC
  Administered 2019-04-19: 11:00:00 0.1 mg via ORAL

## 2019-04-19 MED ORDER — GLIPIZIDE ER 10 MG PO TB24: 10.0000 mg | ORAL_TABLET | Freq: Every day | ORAL | 3 refills | Status: DC

## 2019-04-19 MED ORDER — ALBUTEROL SULFATE HFA 108 (90 BASE) MCG/ACT IN AERS: 2.0000 | INHALATION_SPRAY | Freq: Four times a day (QID) | RESPIRATORY_TRACT | 12 refills | Status: DC | PRN

## 2019-04-19 MED ORDER — POTASSIUM CHLORIDE ER 10 MEQ PO TBCR: 10.0000 meq | EXTENDED_RELEASE_TABLET | Freq: Every day | ORAL | 3 refills | Status: DC

## 2019-04-19 MED ORDER — HYDRALAZINE HCL 100 MG PO TABS: 100.0000 mg | ORAL_TABLET | Freq: Three times a day (TID) | ORAL | 3 refills | Status: DC

## 2019-04-19 MED ORDER — ASPIRIN EC 81 MG PO TBEC: 81.0000 mg | DELAYED_RELEASE_TABLET | Freq: Every day | ORAL | 3 refills | Status: DC | PRN

## 2019-04-19 MED ORDER — HYDROCHLOROTHIAZIDE 25 MG PO TABS: 25.0000 mg | ORAL_TABLET | Freq: Every day | ORAL | 3 refills | Status: DC

## 2019-04-19 MED ORDER — CLONIDINE HCL 0.1 MG PO TABS
0.2000 mg | ORAL_TABLET | Freq: Once | ORAL | Status: AC
  Administered 2019-04-19: 11:00:00 0.2 mg via ORAL

## 2019-04-19 MED ORDER — AMLODIPINE BESYLATE 10 MG PO TABS: 10.0000 mg | ORAL_TABLET | Freq: Every day | ORAL | 3 refills | Status: DC

## 2019-04-19 MED ORDER — TRUE METRIX BLOOD GLUCOSE TEST VI STRP: ORAL_STRIP | 12 refills | Status: AC

## 2019-04-19 MED ORDER — TRUE METRIX AIR GLUCOSE METER DEVI: 1.0000 | Freq: Three times a day (TID) | 0 refills | Status: AC

## 2019-04-19 MED ORDER — LEVETIRACETAM 500 MG PO TABS: 500.0000 mg | ORAL_TABLET | Freq: Two times a day (BID) | ORAL | 3 refills | Status: DC

## 2019-04-19 MED FILL — TRUE METRIX TEST STRIP: 25 days supply | Qty: 100 | Fill #0

## 2019-04-19 MED FILL — POTASSIUM CHLORIDE ER 10 ME: 10 | 30 days supply | Qty: 30 | Fill #0

## 2019-04-19 MED FILL — metFORMIN HCL 1000 MG TABS: 1000 | 30 days supply | Qty: 60 | Fill #0

## 2019-04-19 MED FILL — HYDROCHLOROTHIAZIDE 25 MG T: 25 | 30 days supply | Qty: 30 | Fill #0

## 2019-04-19 MED FILL — TRUEplus LANCETS 28G MISC: 25 days supply | Qty: 100 | Fill #0

## 2019-04-19 MED FILL — METOPROLOL TARTRATE 50 MG T: 50 | 30 days supply | Qty: 60 | Fill #0

## 2019-04-19 MED FILL — hydrALAZINE HCL 100 MG TABS: 100 | 30 days supply | Qty: 90 | Fill #0

## 2019-04-19 MED FILL — levETIRAcetam 500 MG TABS: 500 | 30 days supply | Qty: 60 | Fill #0

## 2019-04-19 MED FILL — ALBUTEROL SULFATE HFA 108 (: 108 (90 BAS | 25 days supply | Qty: 18 | Fill #0

## 2019-04-19 MED FILL — glipiZIDE XL 10 MG TB24: 10 | 30 days supply | Qty: 30 | Fill #0

## 2019-04-19 MED FILL — AMLODIPINE BESYLATE 10 MG T: 10 | 30 days supply | Qty: 30 | Fill #0

## 2019-04-19 MED FILL — !TRUE METRIX BLOOD GLUCOSE: 365 days supply | Qty: 1 | Fill #0

## 2019-04-19 NOTE — Progress Notes (Signed)
[facility]   New Patient--Establish Care  Subjective:  Patient ID: Micheal Levy, male    DOB: Dec 08, 1980  Age: 39 y.o. MRN: [id number]  CC:  Chief Complaint  Patient presents with  . Hospitalization Follow-up    ED 03/23/2019 HTN    HPI Micheal Levy is a 39 year old male who presents for Follow Up today.   Past Medical History:  Diagnosis Date  . Chronic kidney disease   . Diabetes mellitus without complication ([place])   . Hypertension   . Seizures ([place]) 03/2017   x 2   . Stroke ([place]) 03/2017  . Substance abuse ([place])    Current Status: This will be [name] initial office visit with me. He was previously seeing Lanae Boast, NP for his PCP needs. Since his last office visit, he is doing well with no complaints. He denies fatigue, frequent urination, blurred vision, excessive hunger, excessive thirst, weight gain, weight loss, and poor wound healing. He continues to check his feet regularly. Patient is currently not taking any medications at this time as he was recently incarcerated. He denies visual changes, chest pain, cough, shortness of breath, heart palpitations, and falls. He has occasional headaches and dizziness with position changes. Denies severe headaches, confusion, seizures, double vision, and blurred vision, nausea and vomiting. His anxiety is stable today. He denies suicidal ideations, homicidal ideations, or auditory hallucinations. He denies fevers, chills, recent infections, weight loss, and night sweats. No reports of GI problems such as diarrhea, and constipation. He has no reports of blood in stools, dysuria and hematuria. He denies pain today.   History reviewed. No pertinent surgical history.  Family History  Problem Relation Age of Onset  . Diabetes Father   . Heart attack Father   . Diabetes Sister   . Hypertension Brother     Social History   Socioeconomic History  . Marital status: Single     Spouse name: Not on file  . Number of children: Not on file  . Years of education: Not on file  . Highest education level: Not on file  Occupational History  . Not on file  Tobacco Use  . Smoking status: Former Smoker    Types: Cigarettes    Quit date: 04/25/1999    Years since quitting: 20.0  . Smokeless tobacco: Never Used  . Tobacco comment: 2001  Substance and Sexual Activity  . Alcohol use: Yes    Comment: occ, none since Dec 2018  . Drug use: Yes    Types: Cocaine, Marijuana    Comment: weed-daily. cocaine- once or twice a month, 05/25/17 avg 1/2 gm daily, no cocaine since Dec 2018   . Sexual activity: Yes  Other Topics Concern  . Not on file  Social History Narrative   ** Merged History Encounter **   05/25/17 Lives with girlfriend   05/25/17 currently out of work   Education- GED   Children- 3    no caffeine use   Social Determinants of Radio broadcast assistant Strain:   . Difficulty of Paying Living Expenses: Not on file  Food Insecurity:   . Worried About Charity fundraiser in the Last Year: Not on file  . Ran Out of Food in the Last Year: Not on file  Transportation Needs:   . Lack of Transportation (Medical): Not on file  . Lack of Transportation (Non-Medical): Not on file  Physical Activity:   .  Days of Exercise per Week: Not on file  . Minutes of Exercise per Session: Not on file  Stress:   . Feeling of Stress : Not on file  Social Connections:   . Frequency of Communication with Friends and Family: Not on file  . Frequency of Social Gatherings with Friends and Family: Not on file  . Attends Religious Services: Not on file  . Active Member of Clubs or Organizations: Not on file  . Attends Archivist Meetings: Not on file  . Marital Status: Not on file  Intimate Partner Violence:   . Fear of Current or Ex-Partner: Not on file  . Emotionally Abused: Not on file  . Physically Abused: Not on file  . Sexually Abused: Not on file     Outpatient Medications Prior to Visit  Medication Sig Dispense Refill  . amLODipine (NORVASC) 10 MG tablet Take 1 tablet (10 mg total) by mouth daily. 30 tablet 0  . Insulin Glargine (LANTUS SOLOSTAR) 100 UNIT/ML Solostar Pen Inject 20 Units into the skin daily at 10 pm. (Patient not taking: Reported on 01/04/2018) 5 pen 11  . Insulin Pen Needle (PEN NEEDLES) 31G X 6 MM MISC 1 each by Does not apply route at bedtime. 100 each 12  . INSULIN SYRINGE .5CC/29G (B-D INS SYR ULTRAFINE .5CC/29G) 29G X 1/2" 0.5 ML MISC 1 each by Does not apply route at bedtime. 100 each 5  . metoprolol tartrate (LOPRESSOR) 25 MG tablet Take 1 tablet (25 mg total) by mouth 3 (three) times daily. 90 tablet 3  . albuterol (PROVENTIL HFA;VENTOLIN HFA) 108 (90 Base) MCG/ACT inhaler Inhale 2 puffs into the lungs every 6 (six) hours as needed for wheezing or shortness of breath. (Patient not taking: Reported on 01/04/2018) 1 Inhaler 3  . albuterol (PROVENTIL) (2.5 MG/3ML) 0.083% nebulizer solution Take 3 mLs (2.5 mg total) by nebulization every 6 (six) hours as needed for wheezing or shortness of breath. (Patient not taking: Reported on 01/04/2018) 75 mL 12  . albuterol (PROVENTIL) (2.5 MG/3ML) 0.083% nebulizer solution Take 3 mLs (2.5 mg total) by nebulization every 6 (six) hours as needed for wheezing or shortness of breath. (Patient not taking: Reported on 01/04/2018) 75 mL 12  . amLODipine (NORVASC) 10 MG tablet Take 1 tablet (10 mg total) by mouth daily. 30 tablet 5  . aspirin EC 81 MG tablet Take 1 tablet (81 mg total) by mouth daily as needed (pain/headache). 30 tablet 3  . Blood Glucose Monitoring Suppl (TRUE METRIX AIR GLUCOSE METER) DEVI 1 each by Does not apply route 4 (four) times daily -  before meals and at bedtime. 1 Device 0  . glipiZIDE (GLUCOTROL XL) 10 MG 24 hr tablet Take 1 tablet (10 mg total) by mouth daily with breakfast. 30 tablet 5  . glucose blood (TRUE METRIX BLOOD GLUCOSE TEST) test strip Use as  instructed 100 each 12  . hydrALAZINE (APRESOLINE) 100 MG tablet Take 1 tablet (100 mg total) by mouth 3 (three) times daily. 180 tablet 0  . hydrochlorothiazide (HYDRODIURIL) 25 MG tablet Take 1 tablet (25 mg total) by mouth daily. 30 tablet 5  . Lancets MISC 1 each by Does not apply route 4 (four) times daily -  before meals and at bedtime. 100 each 5  . levETIRAcetam (KEPPRA) 500 MG tablet Take 1 tablet (500 mg total) by mouth 2 (two) times daily. 60 tablet 3  . metFORMIN (GLUCOPHAGE) 1000 MG tablet Take 1 tablet (1,000 mg total) by  mouth 2 (two) times daily with a meal. 180 tablet 3  . metoprolol tartrate (LOPRESSOR) 50 MG tablet Take 1 tablet (50 mg total) by mouth 2 (two) times daily. 60 tablet 0  . potassium chloride (K-DUR) 10 MEQ tablet Take 1 tablet (10 mEq total) by mouth daily. 30 tablet 3   No facility-administered medications prior to visit.    No Known Allergies  ROS Review of Systems  Constitutional: Negative.   HENT: Negative.   Eyes: Negative.   Respiratory: Negative.   Cardiovascular: Negative.   Gastrointestinal: Negative.   Endocrine: Negative.   Genitourinary: Negative.   Musculoskeletal: Negative.   Skin: Negative.   Allergic/Immunologic: Negative.   Neurological: Positive for dizziness (occasional ) and headaches (occasional ).  Hematological: Negative.   Psychiatric/Behavioral: Negative.       Objective:    Physical Exam  Constitutional: He is oriented to person, place, and time. He appears well-developed and well-nourished.  HENT:  Head: Normocephalic and atraumatic.  Eyes: Conjunctivae are normal.  Cardiovascular: Normal rate, normal heart sounds and intact distal pulses.  Pulmonary/Chest: Effort normal and breath sounds normal.  Abdominal: Soft. Bowel sounds are normal.  Musculoskeletal:        General: Normal range of motion.     Cervical back: Normal range of motion and neck supple.  Neurological: He is alert and oriented to person, place,  and time. He has normal reflexes.  Skin: Skin is warm and dry.  Psychiatric: He has a normal mood and affect. His behavior is normal. Judgment and thought content normal.  Nursing note and vitals reviewed.   BP (!) 207/134   Pulse (!) 102   Temp 98 F (36.7 C)   Ht '5\' 9"'$  (1.753 m)   Wt 236 lb 9.6 oz (107.3 kg)   SpO2 96%   BMI 34.94 kg/m  Wt Readings from Last 3 Encounters:  04/19/19 236 lb 9.6 oz (107.3 kg)  01/04/18 236 lb (107 kg)  07/02/17 223 lb (101.2 kg)     Health Maintenance Due  Topic Date Due  . FOOT EXAM  04/02/2018  . OPHTHALMOLOGY EXAM  08/05/2018    There are no preventive care reminders to display for this patient.  Lab Results  Component Value Date   TSH 1.910 04/19/2019   Lab Results  Component Value Date   WBC 9.5 04/19/2019   HGB 13.0 04/19/2019   HCT 39.7 04/19/2019   MCV 79 04/19/2019   PLT 222 04/19/2019   Lab Results  Component Value Date   NA 138 04/19/2019   K 3.8 04/19/2019   CO2 25 04/19/2019   GLUCOSE 191 (H) 04/19/2019   BUN 26 (H) 04/19/2019   CREATININE 1.77 (H) 04/19/2019   BILITOT 0.5 04/19/2019   ALKPHOS 74 04/19/2019   AST 15 04/19/2019   ALT 12 04/19/2019   PROT 6.6 04/19/2019   ALBUMIN 4.2 04/19/2019   CALCIUM 9.6 04/19/2019   ANIONGAP 8 03/26/2017   Lab Results  Component Value Date   CHOL 141 04/19/2019   Lab Results  Component Value Date   HDL 35 (L) 04/19/2019   Lab Results  Component Value Date   LDLCALC 71 04/19/2019   Lab Results  Component Value Date   TRIG 207 (H) 04/19/2019   Lab Results  Component Value Date   CHOLHDL 4.0 04/19/2019   Lab Results  Component Value Date   HGBA1C 7.9 (A) 04/19/2019      Assessment & Plan:   1. Hypertensive  urgency Blood pressures are elevated today. Clonidine 0.3 mg given to patient in office and blood pressures remain elevated. We referred him to ED via ambulance at this time. Patient refused and signed AMA form at discharge. He denies severe  headaches, confusion, seizures, double vision, and blurred vision, nausea and vomiting. He will report to ED if he experiences these symptoms. Patient verbalized understanding.    2. Essential hypertension - cloNIDine (CATAPRES) tablet 0.2 mg - cloNIDine (CATAPRES) tablet 0.1 mg - CBC with Differential - Comp Met (CMET) - Lipid Panel - TSH - Vitamin B12 - Vitamin D, 25-hydroxy - amLODipine (NORVASC) 10 MG tablet; Take 1 tablet (10 mg total) by mouth daily.  Dispense: 30 tablet; Refill: 3 - hydrALAZINE (APRESOLINE) 100 MG tablet; Take 1 tablet (100 mg total) by mouth 3 (three) times daily.  Dispense: 90 tablet; Refill: 3 - hydrochlorothiazide (HYDRODIURIL) 25 MG tablet; Take 1 tablet (25 mg total) by mouth daily.  Dispense: 30 tablet; Refill: 3  3. Uncontrolled type 2 diabetes mellitus with hyperglycemia [facility]) He will restart taking his medications as prescribed. He will continue medication as prescribed, to decrease foods/beverages high in sugars and carbs and follow Heart Healthy or DASH diet. Increase physical activity to at least 30 minutes cardio exercise daily.  - Blood Glucose Monitoring Suppl (TRUE METRIX AIR GLUCOSE METER) DEVI; 1 each by Does not apply route 4 (four) times daily -  before meals and at bedtime.  Dispense: 1 each; Refill: 0 - glipiZIDE (GLUCOTROL XL) 10 MG 24 hr tablet; Take 1 tablet (10 mg total) by mouth daily with breakfast.  Dispense: 30 tablet; Refill: 3 - glucose blood (TRUE METRIX BLOOD GLUCOSE TEST) test strip; Use as instructed  Dispense: 100 each; Refill: 12 - Lancets MISC; 1 each by Does not apply route 4 (four) times daily -  before meals and at bedtime.  Dispense: 100 each; Refill: 5 - metFORMIN (GLUCOPHAGE) 1000 MG tablet; Take 1 tablet (1,000 mg total) by mouth 2 (two) times daily with a meal.  Dispense: 180 tablet; Refill: 3  4. Hemoglobin A1c between 7% and 9% indicating borderline diabetic control Hgb A1c at 7.9 today. Monitor.   5.  Hyperglycemia Blood glucose at 180 today. Monitor.   5. Incarceration Recently released from incarceration.   6. History of seizure - levETIRAcetam (KEPPRA) 500 MG tablet; Take 1 tablet (500 mg total) by mouth 2 (two) times daily.  Dispense: 60 tablet; Refill: 3  7. Hypokalemia - potassium chloride (KLOR-CON) 10 MEQ tablet; Take 1 tablet (10 mEq total) by mouth daily.  Dispense: 30 tablet; Refill: 3  8. Healthcare maintenance - Urinalysis Dipstick - POCT HgB A1C - Glucose (CBG)  9. Follow up He will follow up in 1 week.   Meds ordered this encounter  Medications  . cloNIDine (CATAPRES) tablet 0.2 mg  . cloNIDine (CATAPRES) tablet 0.1 mg  . albuterol (VENTOLIN HFA) 108 (90 Base) MCG/ACT inhaler    Sig: Inhale 2 puffs into the lungs every 6 (six) hours as needed for wheezing or shortness of breath.    Dispense:  8 g    Refill:  12  . albuterol (PROVENTIL) (2.5 MG/3ML) 0.083% nebulizer solution    Sig: Take 3 mLs (2.5 mg total) by nebulization every 6 (six) hours as needed for wheezing or shortness of breath.    Dispense:  75 mL    Refill:  12  . amLODipine (NORVASC) 10 MG tablet    Sig: Take 1 tablet (10 mg  total) by mouth daily.    Dispense:  30 tablet    Refill:  3  . aspirin EC 81 MG tablet    Sig: Take 1 tablet (81 mg total) by mouth daily as needed (pain/headache).    Dispense:  30 tablet    Refill:  3  . Blood Glucose Monitoring Suppl (TRUE METRIX AIR GLUCOSE METER) DEVI    Sig: 1 each by Does not apply route 4 (four) times daily -  before meals and at bedtime.    Dispense:  1 each    Refill:  0  . glipiZIDE (GLUCOTROL XL) 10 MG 24 hr tablet    Sig: Take 1 tablet (10 mg total) by mouth daily with breakfast.    Dispense:  30 tablet    Refill:  3  . glucose blood (TRUE METRIX BLOOD GLUCOSE TEST) test strip    Sig: Use as instructed    Dispense:  100 each    Refill:  12  . hydrALAZINE (APRESOLINE) 100 MG tablet    Sig: Take 1 tablet (100 mg total) by mouth 3  (three) times daily.    Dispense:  90 tablet    Refill:  3    Dose change  . hydrochlorothiazide (HYDRODIURIL) 25 MG tablet    Sig: Take 1 tablet (25 mg total) by mouth daily.    Dispense:  30 tablet    Refill:  3  . Lancets MISC    Sig: 1 each by Does not apply route 4 (four) times daily -  before meals and at bedtime.    Dispense:  100 each    Refill:  5  . levETIRAcetam (KEPPRA) 500 MG tablet    Sig: Take 1 tablet (500 mg total) by mouth 2 (two) times daily.    Dispense:  60 tablet    Refill:  3  . metFORMIN (GLUCOPHAGE) 1000 MG tablet    Sig: Take 1 tablet (1,000 mg total) by mouth 2 (two) times daily with a meal.    Dispense:  180 tablet    Refill:  3  . metoprolol tartrate (LOPRESSOR) 50 MG tablet    Sig: Take 1 tablet (50 mg total) by mouth 2 (two) times daily.    Dispense:  60 tablet    Refill:  3  . potassium chloride (KLOR-CON) 10 MEQ tablet    Sig: Take 1 tablet (10 mEq total) by mouth daily.    Dispense:  30 tablet    Refill:  3    Orders Placed This Encounter  Procedures  . CBC with Differential  . Comp Met (CMET)  . Lipid Panel  . TSH  . Vitamin B12  . Vitamin D, 25-hydroxy  . Urinalysis Dipstick  . POCT HgB A1C  . Glucose (CBG)    Referral Orders  No referral(s) requested today   Kathe Becton,  MSN, FNP-BC [place], [place] [address] [phone]   Problem List Items Addressed This Visit      Other   Hyperglycemia    Other Visit Diagnoses    Hypertensive urgency    -  Primary   Relevant Medications   cloNIDine (CATAPRES) tablet 0.2 mg (Completed)   cloNIDine (CATAPRES) tablet 0.1 mg (Completed)   amLODipine (NORVASC) 10 MG tablet   aspirin EC 81 MG tablet   hydrALAZINE (APRESOLINE) 100 MG tablet   hydrochlorothiazide (HYDRODIURIL) 25 MG tablet  metoprolol tartrate (LOPRESSOR) 50 MG tablet   Essential hypertension        Relevant Medications   cloNIDine (CATAPRES) tablet 0.2 mg (Completed)   cloNIDine (CATAPRES) tablet 0.1 mg (Completed)   amLODipine (NORVASC) 10 MG tablet   aspirin EC 81 MG tablet   hydrALAZINE (APRESOLINE) 100 MG tablet   hydrochlorothiazide (HYDRODIURIL) 25 MG tablet   metoprolol tartrate (LOPRESSOR) 50 MG tablet   Other Relevant Orders   CBC with Differential (Completed)   Comp Met (CMET) (Completed)   Lipid Panel (Completed)   TSH (Completed)   Vitamin B12 (Completed)   Vitamin D, 25-hydroxy (Completed)   Uncontrolled type 2 diabetes mellitus with hyperglycemia ([facility])       Relevant Medications   aspirin EC 81 MG tablet   Blood Glucose Monitoring Suppl (TRUE METRIX AIR GLUCOSE METER) DEVI   glipiZIDE (GLUCOTROL XL) 10 MG 24 hr tablet   glucose blood (TRUE METRIX BLOOD GLUCOSE TEST) test strip   Lancets MISC   metFORMIN (GLUCOPHAGE) 1000 MG tablet   Incarceration       History of seizure       Relevant Medications   levETIRAcetam (KEPPRA) 500 MG tablet   Hypokalemia       Relevant Medications   potassium chloride (KLOR-CON) 10 MEQ tablet   Healthcare maintenance       Relevant Orders   Urinalysis Dipstick (Completed)   POCT HgB A1C (Completed)   Glucose (CBG) (Completed)   Follow up          Meds ordered this encounter  Medications  . cloNIDine (CATAPRES) tablet 0.2 mg  . cloNIDine (CATAPRES) tablet 0.1 mg  . albuterol (VENTOLIN HFA) 108 (90 Base) MCG/ACT inhaler    Sig: Inhale 2 puffs into the lungs every 6 (six) hours as needed for wheezing or shortness of breath.    Dispense:  8 g    Refill:  12  . albuterol (PROVENTIL) (2.5 MG/3ML) 0.083% nebulizer solution    Sig: Take 3 mLs (2.5 mg total) by nebulization every 6 (six) hours as needed for wheezing or shortness of breath.    Dispense:  75 mL    Refill:  12  . amLODipine (NORVASC) 10 MG tablet    Sig: Take 1 tablet (10 mg total) by mouth daily.    Dispense:  30 tablet    Refill:  3  . aspirin EC 81 MG  tablet    Sig: Take 1 tablet (81 mg total) by mouth daily as needed (pain/headache).    Dispense:  30 tablet    Refill:  3  . Blood Glucose Monitoring Suppl (TRUE METRIX AIR GLUCOSE METER) DEVI    Sig: 1 each by Does not apply route 4 (four) times daily -  before meals and at bedtime.    Dispense:  1 each    Refill:  0  . glipiZIDE (GLUCOTROL XL) 10 MG 24 hr tablet    Sig: Take 1 tablet (10 mg total) by mouth daily with breakfast.    Dispense:  30 tablet    Refill:  3  . glucose blood (TRUE METRIX BLOOD GLUCOSE TEST) test strip    Sig: Use as instructed    Dispense:  100 each    Refill:  12  . hydrALAZINE (APRESOLINE) 100 MG tablet    Sig: Take 1 tablet (100 mg total) by mouth 3 (three) times daily.    Dispense:  90 tablet    Refill:  3  Dose change  . hydrochlorothiazide (HYDRODIURIL) 25 MG tablet    Sig: Take 1 tablet (25 mg total) by mouth daily.    Dispense:  30 tablet    Refill:  3  . Lancets MISC    Sig: 1 each by Does not apply route 4 (four) times daily -  before meals and at bedtime.    Dispense:  100 each    Refill:  5  . levETIRAcetam (KEPPRA) 500 MG tablet    Sig: Take 1 tablet (500 mg total) by mouth 2 (two) times daily.    Dispense:  60 tablet    Refill:  3  . metFORMIN (GLUCOPHAGE) 1000 MG tablet    Sig: Take 1 tablet (1,000 mg total) by mouth 2 (two) times daily with a meal.    Dispense:  180 tablet    Refill:  3  . metoprolol tartrate (LOPRESSOR) 50 MG tablet    Sig: Take 1 tablet (50 mg total) by mouth 2 (two) times daily.    Dispense:  60 tablet    Refill:  3  . potassium chloride (KLOR-CON) 10 MEQ tablet    Sig: Take 1 tablet (10 mEq total) by mouth daily.    Dispense:  30 tablet    Refill:  3    Follow-up: Return in about 1 week (around 04/26/2019) for OV.    Azzie Glatter, FNP

## 2019-04-20 LAB — COMPREHENSIVE METABOLIC PANEL
ALT: 12 IU/L (ref 0–44)
AST: 15 IU/L (ref 0–40)
Albumin/Globulin Ratio: 1.8 (ref 1.2–2.2)
Albumin: 4.2 g/dL (ref 4.0–5.0)
Alkaline Phosphatase: 74 IU/L (ref 39–117)
BUN/Creatinine Ratio: 15 (ref 9–20)
BUN: 26 mg/dL — ABNORMAL HIGH (ref 6–24)
Bilirubin Total: 0.5 mg/dL (ref 0.0–1.2)
CO2: 25 mmol/L (ref 20–29)
Calcium: 9.6 mg/dL (ref 8.7–10.2)
Chloride: 98 mmol/L (ref 96–106)
Creatinine, Ser: 1.77 mg/dL — ABNORMAL HIGH (ref 0.76–1.27)
GFR calc Af Amer: 54 mL/min/{1.73_m2} — ABNORMAL LOW (ref >59)
GFR calc non Af Amer: 47 mL/min/{1.73_m2} — ABNORMAL LOW (ref >59)
Globulin, Total: 2.4 g/dL (ref 1.5–4.5)
Glucose: 191 mg/dL — ABNORMAL HIGH (ref 65–99)
Potassium: 3.8 mmol/L (ref 3.5–5.2)
Sodium: 138 mmol/L (ref 134–144)
Total Protein: 6.6 g/dL (ref 6.0–8.5)

## 2019-04-20 LAB — CBC WITH DIFFERENTIAL/PLATELET
Basophils Absolute: 0.1 10*3/uL (ref 0.0–0.2)
Basos: 1 %
EOS (ABSOLUTE): 0.2 10*3/uL (ref 0.0–0.4)
Eos: 2 %
Hematocrit: 39.7 % (ref 37.5–51.0)
Hemoglobin: 13 g/dL (ref 13.0–17.7)
Immature Grans (Abs): 0 10*3/uL (ref 0.0–0.1)
Immature Granulocytes: 0 %
Lymphocytes Absolute: 2.8 10*3/uL (ref 0.7–3.1)
Lymphs: 30 %
MCH: 25.9 pg — ABNORMAL LOW (ref 26.6–33.0)
MCHC: 32.7 g/dL (ref 31.5–35.7)
MCV: 79 fL (ref 79–97)
Monocytes Absolute: 0.6 10*3/uL (ref 0.1–0.9)
Monocytes: 6 %
Neutrophils Absolute: 5.8 10*3/uL (ref 1.4–7.0)
Neutrophils: 61 %
Platelets: 222 10*3/uL (ref 150–450)
RBC: 5.01 x10E6/uL (ref 4.14–5.80)
RDW: 16.4 % — ABNORMAL HIGH (ref 11.6–15.4)
WBC: 9.5 10*3/uL (ref 3.4–10.8)

## 2019-04-20 LAB — LIPID PANEL
Chol/HDL Ratio: 4 ratio (ref 0.0–5.0)
Cholesterol, Total: 141 mg/dL (ref 100–199)
HDL: 35 mg/dL — ABNORMAL LOW (ref >39)
LDL Chol Calc (NIH): 71 mg/dL (ref 0–99)
Triglycerides: 207 mg/dL — ABNORMAL HIGH (ref 0–149)
VLDL Cholesterol Cal: 35 mg/dL (ref 5–40)

## 2019-04-20 LAB — TSH: TSH: 1.91 u[IU]/mL (ref 0.450–4.500)

## 2019-04-20 LAB — VITAMIN B12: Vitamin B-12: 439 pg/mL (ref 232–1245)

## 2019-04-20 LAB — VITAMIN D 25 HYDROXY (VIT D DEFICIENCY, FRACTURES): Vit D, 25-Hydroxy: 8.6 ng/mL — ABNORMAL LOW (ref 30.0–100.0)

## 2019-04-20 MED FILL — ALBUTEROL SUL 2.5 MG/3 ML S: (2.5 MG/3ML | 6 days supply | Qty: 75 | Fill #0

## 2019-04-21 ENCOUNTER — Encounter: Payer: Self-pay | Admitting: Family Medicine

## 2019-04-21 DIAGNOSIS — Z87898 Personal history of other specified conditions: Secondary | ICD-10-CM | POA: Insufficient documentation

## 2019-04-21 DIAGNOSIS — E876 Hypokalemia: Secondary | ICD-10-CM | POA: Insufficient documentation

## 2019-04-21 DIAGNOSIS — E1165 Type 2 diabetes mellitus with hyperglycemia: Secondary | ICD-10-CM | POA: Insufficient documentation

## 2019-04-21 DIAGNOSIS — I10 Essential (primary) hypertension: Secondary | ICD-10-CM | POA: Insufficient documentation

## 2019-04-21 DIAGNOSIS — E119 Type 2 diabetes mellitus without complications: Secondary | ICD-10-CM | POA: Insufficient documentation

## 2019-04-21 DIAGNOSIS — I160 Hypertensive urgency: Secondary | ICD-10-CM | POA: Insufficient documentation

## 2019-04-21 DIAGNOSIS — R7303 Prediabetes: Secondary | ICD-10-CM | POA: Insufficient documentation

## 2019-04-25 ENCOUNTER — Encounter: Payer: Self-pay | Admitting: Family Medicine

## 2019-04-25 ENCOUNTER — Other Ambulatory Visit: Payer: Self-pay | Admitting: Family Medicine

## 2019-04-25 DIAGNOSIS — E785 Hyperlipidemia, unspecified: Secondary | ICD-10-CM

## 2019-04-25 DIAGNOSIS — E1169 Type 2 diabetes mellitus with other specified complication: Secondary | ICD-10-CM

## 2019-04-25 DIAGNOSIS — E559 Vitamin D deficiency, unspecified: Secondary | ICD-10-CM

## 2019-04-25 MED ORDER — ATORVASTATIN CALCIUM 10 MG PO TABS: 10.0000 mg | ORAL_TABLET | Freq: Every day | ORAL | 6 refills | Status: DC

## 2019-04-25 MED ORDER — VITAMIN D (ERGOCALCIFEROL) 1.25 MG (50000 UNIT) PO CAPS: 50000.0000 [IU] | ORAL_CAPSULE | ORAL | 6 refills | Status: DC

## 2019-04-25 MED FILL — VIT D2 1.25 MG (50,000 UNIT: 1.25 MG | 35 days supply | Qty: 5 | Fill #0

## 2019-04-25 MED FILL — ATORVASTATIN 10 MG TABLET: 10 | 30 days supply | Qty: 30 | Fill #0

## 2019-04-26 ENCOUNTER — Telehealth: Payer: Self-pay

## 2019-04-26 NOTE — Telephone Encounter (Signed)
done

## 2019-04-26 NOTE — Telephone Encounter (Signed)
Both contacts in patient chart are for local [employer]. Patient does have an updated phone or address. Please advise.

## 2019-04-27 ENCOUNTER — Other Ambulatory Visit: Payer: Self-pay | Admitting: Nurse Practitioner

## 2019-04-27 ENCOUNTER — Ambulatory Visit: Admitting: Nurse Practitioner

## 2019-04-27 NOTE — Patient Instructions (Signed)
 Diabetes Mellitus and Standards of Medical Care Managing diabetes (diabetes mellitus) can be complicated. Your diabetes treatment may be managed by a team of health care providers, including:  A physician who specializes in diabetes (endocrinologist).  A nurse practitioner or physician assistant.  Nurses.  A diet and nutrition specialist (registered dietitian).  A certified diabetes educator (CDE).  An exercise specialist.  A pharmacist.  An eye doctor.  A foot specialist (podiatrist).  A dentist.  A primary care provider.  A mental health provider. Your health care providers follow guidelines to help you get the best quality of care. The following schedule is a general guideline for your diabetes management plan. Your health care providers may give you more specific instructions. Physical exams Upon being diagnosed with diabetes mellitus, and each year after that, your health care provider will ask about your medical and family history. He or she will also do a physical exam. Your exam may include:  Measuring your height, weight, and body mass index (BMI).  Checking your blood pressure. This will be done at every routine medical visit. Your target blood pressure may vary depending on your medical conditions, your age, and other factors.  Thyroid gland exam.  Skin exam.  Screening for damage to your nerves (peripheral neuropathy). This may include checking the pulse in your legs and feet and checking the level of sensation in your hands and feet.  A complete foot exam to inspect the structure and skin of your feet, including checking for cuts, bruises, redness, blisters, sores, or other problems.  Screening for blood vessel (vascular) problems, which may include checking the pulse in your legs and feet and checking your temperature. Blood tests Depending on your treatment plan and your personal needs, you may have the following tests done:  HbA1c (hemoglobin A1c).  This test provides information about blood sugar (glucose) control over the previous 2-3 months. It is used to adjust your treatment plan, if needed. This test will be done: ? At least 2 times a year, if you are meeting your treatment goals. ? 4 times a year, if you are not meeting your treatment goals or if treatment goals have changed.  Lipid testing, including total, LDL, and HDL cholesterol and triglyceride levels. ? The goal for LDL is less than 100 mg/dL (5.5 mmol/L). If you are at high risk for complications, the goal is less than 70 mg/dL (3.9 mmol/L). ? The goal for HDL is 40 mg/dL (2.2 mmol/L) or higher for men and 50 mg/dL (2.8 mmol/L) or higher for women. An HDL cholesterol of 60 mg/dL (3.3 mmol/L) or higher gives some protection against heart disease. ? The goal for triglycerides is less than 150 mg/dL (8.3 mmol/L).  Liver function tests.  Kidney function tests.  Thyroid function tests. Dental and eye exams  Visit your dentist two times a year.  If you have type 1 diabetes, your health care provider may recommend an eye exam 3-5 years after you are diagnosed, and then once a year after your first exam. ? For children with type 1 diabetes, a health care provider may recommend an eye exam when your child is age 10 or older and has had diabetes for 3-5 years. After the first exam, your child should get an eye exam once a year.  If you have type 2 diabetes, your health care provider may recommend an eye exam as soon as you are diagnosed, and then once a year after your first exam. Immunizations     The yearly flu (influenza) vaccine is recommended for everyone 6 months or older who has diabetes.  The pneumonia (pneumococcal) vaccine is recommended for everyone 2 years or older who has diabetes. If you are 65 or older, you may get the pneumonia vaccine as a series of two separate shots.  The hepatitis B vaccine is recommended for adults shortly after being diagnosed with  diabetes.  Adults and children with diabetes should receive all other vaccines according to age-specific recommendations from the Centers for Disease Control and Prevention (CDC). Mental and emotional health Screening for symptoms of eating disorders, anxiety, and depression is recommended at the time of diagnosis and afterward as needed. If your screening shows that you have symptoms (positive screening result), you may need more evaluation and you may work with a mental health care provider. Treatment plan Your treatment plan will be reviewed at every medical visit. You and your health care provider will discuss:  How you are taking your medicines, including insulin.  Any side effects you are experiencing.  Your blood glucose target goals.  The frequency of your blood glucose monitoring.  Lifestyle habits, such as activity level as well as tobacco, alcohol, and substance use. Diabetes self-management education Your health care provider will assess how well you are monitoring your blood glucose levels and whether you are taking your insulin correctly. He or she may refer you to:  A certified diabetes educator to manage your diabetes throughout your life, starting at diagnosis.  A registered dietitian who can create or review your personal nutrition plan.  An exercise specialist who can discuss your activity level and exercise plan. Summary  Managing diabetes (diabetes mellitus) can be complicated. Your diabetes treatment may be managed by a team of health care providers.  Your health care providers follow guidelines in order to help you get the best quality of care.  Standards of care including having regular physical exams, blood tests, blood pressure monitoring, immunizations, screening tests, and education about how to manage your diabetes.  Your health care providers may also give you more specific instructions based on your individual health. This information is not intended  to replace advice given to you by your health care provider. Make sure you discuss any questions you have with your health care provider. Document Revised: 12/11/2017 Document Reviewed: 12/21/2015 Elsevier Patient Education  2020 Elsevier Inc.   Diabetes Mellitus and Foot Care Foot care is an important part of your health, especially when you have diabetes. Diabetes may cause you to have problems because of poor blood flow (circulation) to your feet and legs, which can cause your skin to:  Become thinner and drier.  Break more easily.  Heal more slowly.  Peel and crack. You may also have nerve damage (neuropathy) in your legs and feet, causing decreased feeling in them. This means that you may not notice minor injuries to your feet that could lead to more serious problems. Noticing and addressing any potential problems early is the best way to prevent future foot problems. How to care for your feet Foot hygiene  Wash your feet daily with warm water and mild soap. Do not use hot water. Then, pat your feet and the areas between your toes until they are completely dry. Do not soak your feet as this can dry your skin.  Trim your toenails straight across. Do not dig under them or around the cuticle. File the edges of your nails with an emery board or nail file.  Apply a   moisturizing lotion or petroleum jelly to the skin on your feet and to dry, brittle toenails. Use lotion that does not contain alcohol and is unscented. Do not apply lotion between your toes. Shoes and socks  Wear clean socks or stockings every day. Make sure they are not too tight. Do not wear knee-high stockings since they may decrease blood flow to your legs.  Wear shoes that fit properly and have enough cushioning. Always look in your shoes before you put them on to be sure there are no objects inside.  To break in new shoes, wear them for just a few hours a day. This prevents injuries on your feet. Wounds, scrapes,  corns, and calluses  Check your feet daily for blisters, cuts, bruises, sores, and redness. If you cannot see the bottom of your feet, use a mirror or ask someone for help.  Do not cut corns or calluses or try to remove them with medicine.  If you find a minor scrape, cut, or break in the skin on your feet, keep it and the skin around it clean and dry. You may clean these areas with mild soap and water. Do not clean the area with peroxide, alcohol, or iodine.  If you have a wound, scrape, corn, or callus on your foot, look at it several times a day to make sure it is healing and not infected. Check for: ? Redness, swelling, or pain. ? Fluid or blood. ? Warmth. ? Pus or a bad smell. General instructions  Do not cross your legs. This may decrease blood flow to your feet.  Do not use heating pads or hot water bottles on your feet. They may burn your skin. If you have lost feeling in your feet or legs, you may not know this is happening until it is too late.  Protect your feet from hot and cold by wearing shoes, such as at the beach or on hot pavement.  Schedule a complete foot exam at least once a year (annually) or more often if you have foot problems. If you have foot problems, report any cuts, sores, or bruises to your health care provider immediately. Contact a health care provider if:  You have a medical condition that increases your risk of infection and you have any cuts, sores, or bruises on your feet.  You have an injury that is not healing.  You have redness on your legs or feet.  You feel burning or tingling in your legs or feet.  You have pain or cramps in your legs and feet.  Your legs or feet are numb.  Your feet always feel cold.  You have pain around a toenail. Get help right away if:  You have a wound, scrape, corn, or callus on your foot and: ? You have pain, swelling, or redness that gets worse. ? You have fluid or blood coming from the wound, scrape, corn,  or callus. ? Your wound, scrape, corn, or callus feels warm to the touch. ? You have pus or a bad smell coming from the wound, scrape, corn, or callus. ? You have a fever. ? You have a red line going up your leg. Summary  Check your feet every day for cuts, sores, red spots, swelling, and blisters.  Moisturize feet and legs daily.  Wear shoes that fit properly and have enough cushioning.  If you have foot problems, report any cuts, sores, or bruises to your health care provider immediately.  Schedule a complete foot exam   at least once a year (annually) or more often if you have foot problems. This information is not intended to replace advice given to you by your health care provider. Make sure you discuss any questions you have with your health care provider. Document Revised: 12/15/2018 Document Reviewed: 04/25/2016 Elsevier Patient Education  2020 Elsevier Inc.  

## 2019-04-28 ENCOUNTER — Telehealth: Payer: Self-pay | Admitting: Family Medicine

## 2019-04-28 NOTE — Telephone Encounter (Signed)
Several staff members have attempted to contact patient to update phone number.

## 2019-09-04 ENCOUNTER — Emergency Department (HOSPITAL_COMMUNITY): Payer: Self-pay

## 2019-09-04 ENCOUNTER — Inpatient Hospital Stay (HOSPITAL_COMMUNITY): Payer: Self-pay

## 2019-09-04 ENCOUNTER — Inpatient Hospital Stay (HOSPITAL_COMMUNITY)
Admission: EM | Admit: 2019-09-04 | Discharge: 2019-09-13 | DRG: 100 | Disposition: A | Discharge status: Home / Self Care | Attending: Internal Medicine | Admitting: Internal Medicine

## 2019-09-04 ENCOUNTER — Inpatient Hospital Stay (HOSPITAL_COMMUNITY)

## 2019-09-04 DIAGNOSIS — R739 Hyperglycemia, unspecified: Secondary | ICD-10-CM

## 2019-09-04 DIAGNOSIS — R4182 Altered mental status, unspecified: Secondary | ICD-10-CM

## 2019-09-04 DIAGNOSIS — R4701 Aphasia: Secondary | ICD-10-CM | POA: Diagnosis present

## 2019-09-04 DIAGNOSIS — I5031 Acute diastolic (congestive) heart failure: Secondary | ICD-10-CM | POA: Diagnosis present

## 2019-09-04 DIAGNOSIS — R569 Unspecified convulsions: Secondary | ICD-10-CM

## 2019-09-04 DIAGNOSIS — I10 Essential (primary) hypertension: Secondary | ICD-10-CM

## 2019-09-04 DIAGNOSIS — E1165 Type 2 diabetes mellitus with hyperglycemia: Secondary | ICD-10-CM

## 2019-09-04 DIAGNOSIS — Z9114 Patient's other noncompliance with medication regimen: Secondary | ICD-10-CM

## 2019-09-04 DIAGNOSIS — J690 Pneumonitis due to inhalation of food and vomit: Secondary | ICD-10-CM | POA: Diagnosis present

## 2019-09-04 DIAGNOSIS — Z8249 Family history of ischemic heart disease and other diseases of the circulatory system: Secondary | ICD-10-CM

## 2019-09-04 DIAGNOSIS — N182 Chronic kidney disease, stage 2 (mild): Secondary | ICD-10-CM | POA: Diagnosis present

## 2019-09-04 DIAGNOSIS — J9602 Acute respiratory failure with hypercapnia: Secondary | ICD-10-CM

## 2019-09-04 DIAGNOSIS — Z20822 Contact with and (suspected) exposure to covid-19: Secondary | ICD-10-CM | POA: Diagnosis present

## 2019-09-04 DIAGNOSIS — J9601 Acute respiratory failure with hypoxia: Secondary | ICD-10-CM

## 2019-09-04 DIAGNOSIS — J9690 Respiratory failure, unspecified, unspecified whether with hypoxia or hypercapnia: Secondary | ICD-10-CM

## 2019-09-04 DIAGNOSIS — E1122 Type 2 diabetes mellitus with diabetic chronic kidney disease: Secondary | ICD-10-CM | POA: Diagnosis present

## 2019-09-04 DIAGNOSIS — E871 Hypo-osmolality and hyponatremia: Secondary | ICD-10-CM | POA: Diagnosis present

## 2019-09-04 DIAGNOSIS — Z87898 Personal history of other specified conditions: Secondary | ICD-10-CM

## 2019-09-04 DIAGNOSIS — I130 Hypertensive heart and chronic kidney disease with heart failure and stage 1 through stage 4 chronic kidney disease, or unspecified chronic kidney disease: Secondary | ICD-10-CM | POA: Diagnosis present

## 2019-09-04 DIAGNOSIS — Z8673 Personal history of transient ischemic attack (TIA), and cerebral infarction without residual deficits: Secondary | ICD-10-CM

## 2019-09-04 DIAGNOSIS — E876 Hypokalemia: Secondary | ICD-10-CM

## 2019-09-04 DIAGNOSIS — J9600 Acute respiratory failure, unspecified whether with hypoxia or hypercapnia: Secondary | ICD-10-CM

## 2019-09-04 DIAGNOSIS — Z833 Family history of diabetes mellitus: Secondary | ICD-10-CM

## 2019-09-04 DIAGNOSIS — G40901 Epilepsy, unspecified, not intractable, with status epilepticus: Principal | ICD-10-CM | POA: Diagnosis present

## 2019-09-04 DIAGNOSIS — Z794 Long term (current) use of insulin: Secondary | ICD-10-CM

## 2019-09-04 DIAGNOSIS — Z79899 Other long term (current) drug therapy: Secondary | ICD-10-CM

## 2019-09-04 DIAGNOSIS — Z7982 Long term (current) use of aspirin: Secondary | ICD-10-CM

## 2019-09-04 DIAGNOSIS — E119 Type 2 diabetes mellitus without complications: Secondary | ICD-10-CM

## 2019-09-04 DIAGNOSIS — D631 Anemia in chronic kidney disease: Secondary | ICD-10-CM | POA: Diagnosis present

## 2019-09-04 DIAGNOSIS — N179 Acute kidney failure, unspecified: Secondary | ICD-10-CM | POA: Diagnosis present

## 2019-09-04 DIAGNOSIS — E870 Hyperosmolality and hypernatremia: Secondary | ICD-10-CM | POA: Diagnosis not present

## 2019-09-04 DIAGNOSIS — Z87891 Personal history of nicotine dependence: Secondary | ICD-10-CM

## 2019-09-04 DIAGNOSIS — K5900 Constipation, unspecified: Secondary | ICD-10-CM | POA: Diagnosis not present

## 2019-09-04 DIAGNOSIS — Z01818 Encounter for other preprocedural examination: Secondary | ICD-10-CM

## 2019-09-04 DIAGNOSIS — G9341 Metabolic encephalopathy: Secondary | ICD-10-CM | POA: Diagnosis present

## 2019-09-04 DIAGNOSIS — R531 Weakness: Secondary | ICD-10-CM

## 2019-09-04 DIAGNOSIS — E781 Pure hyperglyceridemia: Secondary | ICD-10-CM | POA: Diagnosis present

## 2019-09-04 LAB — POCT I-STAT 7, (LYTES, BLD GAS, ICA,H+H)
Acid-Base Excess: 7 mmol/L — ABNORMAL HIGH (ref 0.0–2.0)
Acid-Base Excess: 6 mmol/L — ABNORMAL HIGH (ref 0.0–2.0)
Bicarbonate: 34.6 mmol/L — ABNORMAL HIGH (ref 20.0–28.0)
Bicarbonate: 32.2 mmol/L — ABNORMAL HIGH (ref 20.0–28.0)
Calcium, Ion: 1.26 mmol/L (ref 1.15–1.40)
Calcium, Ion: 1.2 mmol/L (ref 1.15–1.40)
HCT: 48 % (ref 39.0–52.0)
HCT: 41 % (ref 39.0–52.0)
Hemoglobin: 16.3 g/dL (ref 13.0–17.0)
Hemoglobin: 13.9 g/dL (ref 13.0–17.0)
O2 Saturation: 100 %
O2 Saturation: 97 %
Patient temperature: 98.6
Potassium: 3.1 mmol/L — ABNORMAL LOW (ref 3.5–5.1)
Potassium: 3.1 mmol/L — ABNORMAL LOW (ref 3.5–5.1)
Sodium: 134 mmol/L — ABNORMAL LOW (ref 135–145)
Sodium: 134 mmol/L — ABNORMAL LOW (ref 135–145)
TCO2: 36 mmol/L — ABNORMAL HIGH (ref 22–32)
TCO2: 34 mmol/L — ABNORMAL HIGH (ref 22–32)
pCO2 arterial: 59.2 mmHg — ABNORMAL HIGH (ref 32.0–48.0)
pCO2 arterial: 53.3 mmHg — ABNORMAL HIGH (ref 32.0–48.0)
pH, Arterial: 7.375 (ref 7.350–7.450)
pH, Arterial: 7.389 (ref 7.350–7.450)
pO2, Arterial: 261 mmHg — ABNORMAL HIGH (ref 83.0–108.0)
pO2, Arterial: 93 mmHg (ref 83.0–108.0)

## 2019-09-04 LAB — BASIC METABOLIC PANEL
Anion gap: 11 (ref 5–15)
Anion gap: 10 (ref 5–15)
Anion gap: 15 (ref 5–15)
Anion gap: 10 (ref 5–15)
BUN: 18 mg/dL (ref 6–20)
BUN: 17 mg/dL (ref 6–20)
BUN: 20 mg/dL (ref 6–20)
BUN: 18 mg/dL (ref 6–20)
CO2: 29 mmol/L (ref 22–32)
CO2: 29 mmol/L (ref 22–32)
CO2: 24 mmol/L (ref 22–32)
CO2: 28 mmol/L (ref 22–32)
Calcium: 9 mg/dL (ref 8.9–10.3)
Calcium: 8.6 mg/dL — ABNORMAL LOW (ref 8.9–10.3)
Calcium: 8.1 mg/dL — ABNORMAL LOW (ref 8.9–10.3)
Calcium: 8.4 mg/dL — ABNORMAL LOW (ref 8.9–10.3)
Chloride: 99 mmol/L (ref 98–111)
Chloride: 101 mmol/L (ref 98–111)
Chloride: 98 mmol/L (ref 98–111)
Chloride: 101 mmol/L (ref 98–111)
Creatinine, Ser: 2.14 mg/dL — ABNORMAL HIGH (ref 0.61–1.24)
Creatinine, Ser: 2.02 mg/dL — ABNORMAL HIGH (ref 0.61–1.24)
Creatinine, Ser: 2.1 mg/dL — ABNORMAL HIGH (ref 0.61–1.24)
Creatinine, Ser: 2.06 mg/dL — ABNORMAL HIGH (ref 0.61–1.24)
GFR calc Af Amer: 43 mL/min — ABNORMAL LOW (ref >60)
GFR calc Af Amer: 46 mL/min — ABNORMAL LOW (ref >60)
GFR calc Af Amer: 44 mL/min — ABNORMAL LOW (ref >60)
GFR calc Af Amer: 45 mL/min — ABNORMAL LOW (ref >60)
GFR calc non Af Amer: 37 mL/min — ABNORMAL LOW (ref >60)
GFR calc non Af Amer: 40 mL/min — ABNORMAL LOW (ref >60)
GFR calc non Af Amer: 38 mL/min — ABNORMAL LOW (ref >60)
GFR calc non Af Amer: 39 mL/min — ABNORMAL LOW (ref >60)
Glucose, Bld: 334 mg/dL — ABNORMAL HIGH (ref 70–99)
Glucose, Bld: 165 mg/dL — ABNORMAL HIGH (ref 70–99)
Glucose, Bld: 121 mg/dL — ABNORMAL HIGH (ref 70–99)
Glucose, Bld: 111 mg/dL — ABNORMAL HIGH (ref 70–99)
Potassium: 3 mmol/L — ABNORMAL LOW (ref 3.5–5.1)
Potassium: 3.1 mmol/L — ABNORMAL LOW (ref 3.5–5.1)
Potassium: 4.3 mmol/L (ref 3.5–5.1)
Potassium: 3.3 mmol/L — ABNORMAL LOW (ref 3.5–5.1)
Sodium: 139 mmol/L (ref 135–145)
Sodium: 140 mmol/L (ref 135–145)
Sodium: 137 mmol/L (ref 135–145)
Sodium: 139 mmol/L (ref 135–145)

## 2019-09-04 LAB — URINALYSIS, ROUTINE W REFLEX MICROSCOPIC
Bacteria, UA: NONE SEEN
Bilirubin Urine: NEGATIVE
Glucose, UA: >=500 mg/dL — AB
Hgb urine dipstick: NEGATIVE
Ketones, ur: NEGATIVE mg/dL
Leukocytes,Ua: NEGATIVE
Nitrite: NEGATIVE
Protein, ur: >=300 mg/dL — AB
Specific Gravity, Urine: 1.016 (ref 1.005–1.030)
pH: 7 (ref 5.0–8.0)

## 2019-09-04 LAB — GLUCOSE, CAPILLARY
Glucose-Capillary: 123 mg/dL — ABNORMAL HIGH (ref 70–99)
Glucose-Capillary: 146 mg/dL — ABNORMAL HIGH (ref 70–99)
Glucose-Capillary: 154 mg/dL — ABNORMAL HIGH (ref 70–99)
Glucose-Capillary: 163 mg/dL — ABNORMAL HIGH (ref 70–99)
Glucose-Capillary: 165 mg/dL — ABNORMAL HIGH (ref 70–99)
Glucose-Capillary: 194 mg/dL — ABNORMAL HIGH (ref 70–99)
Glucose-Capillary: 251 mg/dL — ABNORMAL HIGH (ref 70–99)
Glucose-Capillary: 318 mg/dL — ABNORMAL HIGH (ref 70–99)
Glucose-Capillary: 391 mg/dL — ABNORMAL HIGH (ref 70–99)
Glucose-Capillary: 459 mg/dL — ABNORMAL HIGH (ref 70–99)
Glucose-Capillary: 468 mg/dL — ABNORMAL HIGH (ref 70–99)

## 2019-09-04 LAB — CBC
HCT: 44.3 % (ref 39.0–52.0)
Hemoglobin: 14.1 g/dL (ref 13.0–17.0)
MCH: 25 pg — ABNORMAL LOW (ref 26.0–34.0)
MCHC: 31.8 g/dL (ref 30.0–36.0)
MCV: 78.5 fL — ABNORMAL LOW (ref 80.0–100.0)
Platelets: 322 10*3/uL (ref 150–400)
RBC: 5.64 MIL/uL (ref 4.22–5.81)
RDW: 15.4 % (ref 11.5–15.5)
WBC: 13 10*3/uL — ABNORMAL HIGH (ref 4.0–10.5)
nRBC: 0 % (ref 0.0–0.2)

## 2019-09-04 LAB — COMPREHENSIVE METABOLIC PANEL
ALT: 21 U/L (ref 0–44)
AST: 30 U/L (ref 15–41)
Albumin: 3.7 g/dL (ref 3.5–5.0)
Alkaline Phosphatase: 109 U/L (ref 38–126)
Anion gap: 17 — ABNORMAL HIGH (ref 5–15)
BUN: 16 mg/dL (ref 6–20)
CO2: 25 mmol/L (ref 22–32)
Calcium: 9.4 mg/dL (ref 8.9–10.3)
Chloride: 91 mmol/L — ABNORMAL LOW (ref 98–111)
Creatinine, Ser: 1.85 mg/dL — ABNORMAL HIGH (ref 0.61–1.24)
GFR calc Af Amer: 51 mL/min — ABNORMAL LOW (ref >60)
GFR calc non Af Amer: 44 mL/min — ABNORMAL LOW (ref >60)
Glucose, Bld: 592 mg/dL (ref 70–99)
Potassium: 2.6 mmol/L — CL (ref 3.5–5.1)
Sodium: 133 mmol/L — ABNORMAL LOW (ref 135–145)
Total Bilirubin: 0.9 mg/dL (ref 0.3–1.2)
Total Protein: 7.3 g/dL (ref 6.5–8.1)

## 2019-09-04 LAB — RAPID URINE DRUG SCREEN, HOSP PERFORMED
Amphetamines: NOT DETECTED
Barbiturates: NOT DETECTED
Benzodiazepines: NOT DETECTED
Cocaine: NOT DETECTED
Opiates: NOT DETECTED
Tetrahydrocannabinol: NOT DETECTED

## 2019-09-04 LAB — I-STAT CHEM 8, ED
BUN: 18 mg/dL (ref 6–20)
Calcium, Ion: 1.21 mmol/L (ref 1.15–1.40)
Chloride: 92 mmol/L — ABNORMAL LOW (ref 98–111)
Creatinine, Ser: 2.2 mg/dL — ABNORMAL HIGH (ref 0.61–1.24)
Glucose, Bld: 616 mg/dL (ref 70–99)
HCT: 46 % (ref 39.0–52.0)
Hemoglobin: 15.6 g/dL (ref 13.0–17.0)
Potassium: 2.6 mmol/L — CL (ref 3.5–5.1)
Sodium: 136 mmol/L (ref 135–145)
TCO2: 29 mmol/L (ref 22–32)

## 2019-09-04 LAB — CBG MONITORING, ED
Glucose-Capillary: >600 mg/dL (ref 70–99)
Glucose-Capillary: 530 mg/dL (ref 70–99)
Glucose-Capillary: 509 mg/dL (ref 70–99)

## 2019-09-04 LAB — TRIGLYCERIDES: Triglycerides: 319 mg/dL — ABNORMAL HIGH (ref <150)

## 2019-09-04 LAB — DIFFERENTIAL
Abs Immature Granulocytes: 0.05 10*3/uL (ref 0.00–0.07)
Basophils Absolute: 0.1 10*3/uL (ref 0.0–0.1)
Basophils Relative: 1 %
Eosinophils Absolute: 0.2 10*3/uL (ref 0.0–0.5)
Eosinophils Relative: 1 %
Immature Granulocytes: 0 %
Lymphocytes Relative: 35 %
Lymphs Abs: 4.6 10*3/uL — ABNORMAL HIGH (ref 0.7–4.0)
Monocytes Absolute: 0.7 10*3/uL (ref 0.1–1.0)
Monocytes Relative: 5 %
Neutro Abs: 7.5 10*3/uL (ref 1.7–7.7)
Neutrophils Relative %: 58 %

## 2019-09-04 LAB — APTT: aPTT: 20 seconds — ABNORMAL LOW (ref 24–36)

## 2019-09-04 LAB — PROTIME-INR
INR: 1 (ref 0.8–1.2)
Prothrombin Time: 13 seconds (ref 11.4–15.2)

## 2019-09-04 LAB — MAGNESIUM: Magnesium: 2 mg/dL (ref 1.7–2.4)

## 2019-09-04 LAB — HEMOGLOBIN A1C
Hgb A1c MFr Bld: 11.4 % — ABNORMAL HIGH (ref 4.8–5.6)
Mean Plasma Glucose: 280.48 mg/dL

## 2019-09-04 LAB — TROPONIN I (HIGH SENSITIVITY)
Troponin I (High Sensitivity): 150 ng/L (ref <18)
Troponin I (High Sensitivity): 217 ng/L (ref <18)

## 2019-09-04 LAB — ECHOCARDIOGRAM COMPLETE
Height: 73 in
Weight: 3798.97 oz

## 2019-09-04 LAB — MRSA PCR SCREENING: MRSA by PCR: NEGATIVE

## 2019-09-04 LAB — SARS CORONAVIRUS 2 BY RT PCR (HOSPITAL ORDER, PERFORMED IN ~~LOC~~ HOSPITAL LAB): SARS Coronavirus 2: NEGATIVE

## 2019-09-04 LAB — HIV ANTIBODY (ROUTINE TESTING W REFLEX): HIV Screen 4th Generation wRfx: NONREACTIVE

## 2019-09-04 LAB — BRAIN NATRIURETIC PEPTIDE: B Natriuretic Peptide: 513.1 pg/mL — ABNORMAL HIGH (ref 0.0–100.0)

## 2019-09-04 LAB — ETHANOL: Alcohol, Ethyl (B): <10 mg/dL (ref <10)

## 2019-09-04 MED ORDER — DEXTROSE-NACL 5-0.45 % IV SOLN: INTRAVENOUS | Status: DC

## 2019-09-04 MED ORDER — SODIUM CHLORIDE 0.9 % IV SOLN: INTRAVENOUS | Status: DC

## 2019-09-04 MED ORDER — MIDAZOLAM HCL 2 MG/2ML IJ SOLN: 2.0000 mg | INTRAMUSCULAR | Status: DC | PRN

## 2019-09-04 MED ORDER — POTASSIUM CHLORIDE 10 MEQ/100ML IV SOLN
10.0000 meq | INTRAVENOUS | Status: AC
  Administered 2019-09-04 (×3): 10 meq via INTRAVENOUS
  Filled 2019-09-04 (×3): qty 100

## 2019-09-04 MED ORDER — POTASSIUM CHLORIDE 20 MEQ/15ML (10%) PO SOLN
40.0000 meq | Freq: Once | ORAL | Status: AC
  Administered 2019-09-04: 40 meq
  Filled 2019-09-04: qty 30

## 2019-09-04 MED ORDER — POLYETHYLENE GLYCOL 3350 17 G PO PACK: 17.0000 g | PACK | Freq: Every day | ORAL | Status: DC

## 2019-09-04 MED ORDER — POLYETHYLENE GLYCOL 3350 17 G PO PACK: 17.0000 g | PACK | Freq: Every day | ORAL | Status: DC | PRN

## 2019-09-04 MED ORDER — INSULIN REGULAR(HUMAN) IN NACL 100-0.9 UT/100ML-% IV SOLN
INTRAVENOUS | Status: DC
  Administered 2019-09-04: 13 [IU]/h via INTRAVENOUS
  Administered 2019-09-04: 3.2 [IU]/h via INTRAVENOUS
  Filled 2019-09-04 (×2): qty 100

## 2019-09-04 MED ORDER — ONDANSETRON HCL 4 MG/2ML IJ SOLN
4.0000 mg | Freq: Four times a day (QID) | INTRAMUSCULAR | Status: DC | PRN
  Administered 2019-09-09: 4 mg via INTRAVENOUS
  Filled 2019-09-04 (×2): qty 2

## 2019-09-04 MED ORDER — DOCUSATE SODIUM 50 MG/5ML PO LIQD: 100.0000 mg | Freq: Two times a day (BID) | ORAL | Status: DC

## 2019-09-04 MED ORDER — HYDRALAZINE HCL 20 MG/ML IJ SOLN
5.0000 mg | Freq: Four times a day (QID) | INTRAMUSCULAR | Status: AC | PRN
  Administered 2019-09-04: 5 mg via INTRAVENOUS
  Filled 2019-09-04: qty 1

## 2019-09-04 MED ORDER — INSULIN DETEMIR 100 UNIT/ML ~~LOC~~ SOLN
16.0000 [IU] | Freq: Two times a day (BID) | SUBCUTANEOUS | Status: DC
  Filled 2019-09-04: qty 0.16

## 2019-09-04 MED ORDER — PROPOFOL 1000 MG/100ML IV EMUL
INTRAVENOUS | Status: AC
  Filled 2019-09-04: qty 100

## 2019-09-04 MED ORDER — CHLORHEXIDINE GLUCONATE 0.12% ORAL RINSE (MEDLINE KIT)
15.0000 mL | Freq: Two times a day (BID) | OROMUCOSAL | Status: DC
  Administered 2019-09-04 – 2019-09-05 (×2): 15 mL via OROMUCOSAL

## 2019-09-04 MED ORDER — INSULIN ASPART 100 UNIT/ML ~~LOC~~ SOLN
0.0000 [IU] | SUBCUTANEOUS | Status: DC
  Administered 2019-09-04: 2 [IU] via SUBCUTANEOUS
  Administered 2019-09-05: 3 [IU] via SUBCUTANEOUS
  Administered 2019-09-05: 2 [IU] via SUBCUTANEOUS
  Administered 2019-09-05 (×2): 5 [IU] via SUBCUTANEOUS
  Administered 2019-09-05: 3 [IU] via SUBCUTANEOUS
  Administered 2019-09-05: 2 [IU] via SUBCUTANEOUS
  Administered 2019-09-06 (×3): 5 [IU] via SUBCUTANEOUS
  Administered 2019-09-06: 11 [IU] via SUBCUTANEOUS
  Administered 2019-09-06 (×2): 8 [IU] via SUBCUTANEOUS
  Administered 2019-09-06: 11 [IU] via SUBCUTANEOUS
  Administered 2019-09-07: 2 [IU] via SUBCUTANEOUS
  Administered 2019-09-07: 5 [IU] via SUBCUTANEOUS
  Administered 2019-09-07 (×2): 3 [IU] via SUBCUTANEOUS
  Administered 2019-09-07: 11 [IU] via SUBCUTANEOUS
  Administered 2019-09-08 (×2): 3 [IU] via SUBCUTANEOUS
  Administered 2019-09-08: 5 [IU] via SUBCUTANEOUS
  Administered 2019-09-08 (×3): 3 [IU] via SUBCUTANEOUS
  Administered 2019-09-08: 8 [IU] via SUBCUTANEOUS
  Administered 2019-09-09 (×2): 3 [IU] via SUBCUTANEOUS
  Administered 2019-09-09 (×3): 2 [IU] via SUBCUTANEOUS
  Administered 2019-09-10 (×2): 8 [IU] via SUBCUTANEOUS
  Administered 2019-09-10: 2 [IU] via SUBCUTANEOUS
  Administered 2019-09-10 (×2): 3 [IU] via SUBCUTANEOUS
  Administered 2019-09-10: 2 [IU] via SUBCUTANEOUS

## 2019-09-04 MED ORDER — CHLORHEXIDINE GLUCONATE CLOTH 2 % EX PADS
6.0000 | MEDICATED_PAD | Freq: Every day | CUTANEOUS | Status: DC
  Administered 2019-09-05 – 2019-09-13 (×8): 6 via TOPICAL

## 2019-09-04 MED ORDER — HEPARIN SODIUM (PORCINE) 5000 UNIT/ML IJ SOLN
5000.0000 [IU] | Freq: Three times a day (TID) | INTRAMUSCULAR | Status: DC
  Administered 2019-09-04 – 2019-09-13 (×28): 5000 [IU] via SUBCUTANEOUS
  Filled 2019-09-04 (×28): qty 1

## 2019-09-04 MED ORDER — GLUCERNA 1.2 CAL PO LIQD: 1000.0000 mL | ORAL | Status: DC

## 2019-09-04 MED ORDER — ASPIRIN 81 MG PO CHEW
81.0000 mg | CHEWABLE_TABLET | Freq: Every day | ORAL | Status: DC
  Administered 2019-09-04: 81 mg
  Filled 2019-09-04: qty 1

## 2019-09-04 MED ORDER — LEVETIRACETAM IN NACL 1000 MG/100ML IV SOLN
1000.0000 mg | Freq: Once | INTRAVENOUS | Status: AC
  Administered 2019-09-04: 1000 mg via INTRAVENOUS

## 2019-09-04 MED ORDER — FENTANYL 2500MCG IN NS 250ML (10MCG/ML) PREMIX INFUSION
50.0000 ug/h | INTRAVENOUS | Status: DC
  Administered 2019-09-04: 50 ug/h via INTRAVENOUS
  Filled 2019-09-04: qty 250

## 2019-09-04 MED ORDER — FENTANYL BOLUS VIA INFUSION
50.0000 ug | INTRAVENOUS | Status: DC | PRN
  Filled 2019-09-04: qty 50

## 2019-09-04 MED ORDER — ASPIRIN 81 MG PO CHEW: 81.0000 mg | CHEWABLE_TABLET | Freq: Every day | ORAL | Status: DC

## 2019-09-04 MED ORDER — LEVETIRACETAM IN NACL 1000 MG/100ML IV SOLN
1000.0000 mg | Freq: Two times a day (BID) | INTRAVENOUS | Status: DC
  Administered 2019-09-04 – 2019-09-06 (×5): 1000 mg via INTRAVENOUS
  Filled 2019-09-04 (×5): qty 100

## 2019-09-04 MED ORDER — DEXTROSE 50 % IV SOLN: 0.0000 mL | INTRAVENOUS | Status: DC | PRN

## 2019-09-04 MED ORDER — PANTOPRAZOLE SODIUM 40 MG PO PACK
40.0000 mg | PACK | Freq: Every day | ORAL | Status: DC
  Administered 2019-09-04 – 2019-09-09 (×6): 40 mg
  Filled 2019-09-04 (×7): qty 20

## 2019-09-04 MED ORDER — FENTANYL CITRATE (PF) 100 MCG/2ML IJ SOLN
50.0000 ug | Freq: Once | INTRAMUSCULAR | Status: AC
  Administered 2019-09-04: 50 ug via INTRAVENOUS
  Filled 2019-09-04: qty 2

## 2019-09-04 MED ORDER — PROPOFOL 1000 MG/100ML IV EMUL
0.0000 ug/kg/min | INTRAVENOUS | Status: DC
  Administered 2019-09-04: 50 ug/kg/min via INTRAVENOUS
  Administered 2019-09-04: 55 ug/kg/min via INTRAVENOUS
  Administered 2019-09-05 (×2): 35 ug/kg/min via INTRAVENOUS
  Filled 2019-09-04 (×8): qty 100

## 2019-09-04 MED ORDER — LORAZEPAM 2 MG/ML IJ SOLN
INTRAMUSCULAR | Status: AC
  Filled 2019-09-04: qty 1

## 2019-09-04 MED ORDER — ASPIRIN 81 MG PO CHEW
81.0000 mg | CHEWABLE_TABLET | Freq: Every day | ORAL | Status: DC
  Administered 2019-09-04 – 2019-09-09 (×6): 81 mg
  Filled 2019-09-04 (×6): qty 1

## 2019-09-04 MED ORDER — DOCUSATE SODIUM 100 MG PO CAPS: 100.0000 mg | ORAL_CAPSULE | Freq: Two times a day (BID) | ORAL | Status: DC | PRN

## 2019-09-04 MED ORDER — IOHEXOL 350 MG/ML SOLN
100.0000 mL | Freq: Once | INTRAVENOUS | Status: AC | PRN
  Administered 2019-09-04: 100 mL via INTRAVENOUS

## 2019-09-04 MED ORDER — INSULIN DETEMIR 100 UNIT/ML ~~LOC~~ SOLN
16.0000 [IU] | Freq: Two times a day (BID) | SUBCUTANEOUS | Status: DC
  Administered 2019-09-04 – 2019-09-05 (×3): 16 [IU] via SUBCUTANEOUS
  Filled 2019-09-04 (×4): qty 0.16

## 2019-09-04 MED ORDER — ORAL CARE MOUTH RINSE
15.0000 mL | OROMUCOSAL | Status: DC
  Administered 2019-09-04 – 2019-09-05 (×8): 15 mL via OROMUCOSAL

## 2019-09-04 MED ORDER — ALBUTEROL SULFATE (2.5 MG/3ML) 0.083% IN NEBU: 2.5000 mg | INHALATION_SOLUTION | RESPIRATORY_TRACT | Status: DC | PRN

## 2019-09-04 MED ORDER — SODIUM CHLORIDE 0.9% FLUSH: 3.0000 mL | Freq: Once | INTRAVENOUS | Status: DC

## 2019-09-04 MED ORDER — DOCUSATE SODIUM 50 MG/5ML PO LIQD
100.0000 mg | Freq: Two times a day (BID) | ORAL | Status: DC
  Administered 2019-09-04: 100 mg
  Filled 2019-09-04: qty 10

## 2019-09-04 NOTE — Progress Notes (Signed)
Pt transported from MRI to ED via vent. No complications noted.

## 2019-09-04 NOTE — ED Notes (Signed)
Pt in CT, pt not following commands,  0503-206/160 0504-Ativan 1mg  given 0509 -230/147 0520 -Ativan 1mg  given 0521-Cardene gtt started 5mg  0542-pt returned to ED, pt continues to not follow commands, intermittent desat to 88%, pt on 4L [place], MD preparing to intubate. 0542-pt intubated by Dr. , 8.0 ett, #18 OG tube placed 0545-PXR and KUB done 0546-#14 Temp probe foley placed by EMT 0605-Diprivan started 0611-Pt transported to MRI 0632-Code stroke canceled by Dr. 

## 2019-09-04 NOTE — Progress Notes (Signed)
HTN 189/129 170/126 Needs meds for HTN On vent with PIV's and OG   Discussed with manager. RN with patient in MRI.  Hydralazine 5 mg q6 hr prn ordered. CTH no CVA.

## 2019-09-04 NOTE — H&P (Signed)
NAME:  Micheal Levy, MRN:  [id number], DOB:  16-Oct-1980, LOS: 0 ADMISSION DATE:  09/04/2019, CONSULTATION DATE:  09/04/2019 REFERRING MD: Rancour , CHIEF COMPLAINT:  Seizures/ Hyperglycemia  Brief History   Micheal Levy is a 39 y.o. male with a history of substance abuse, DM, HTN , seizures in the setting of hyperglycemia, and previous stroke.who presented to the ED 5/30 early am with seizures, hyperglycemia and HTN. CT and MRI Head were negative for stroke. CTA was negative for PE.  He was intubated for airway protection. PCCM were asked to admit and manage care.   History of present illness   Micheal Levy is a 39 y.o. male with a history of substance abuse, DM, HTN , seizures in the setting of hyperglycemia, and previous stroke who was in his normal state of health when he went to bed at about midnight He did wake up in the middle the night got up to use the bathroom, but it is unclear if he was completely normal when he did that.  09/04/2019 around 3:45 am, his SO awoke to find  him having seizure activity. En route, he was speaking and moving his right side, but did not return to baseline.  After arrival to the emergency department, he developed a progressive aphasia, and then was noted to be hemiparetic on the right.  Due to previous history of stroke and DM, CTA/CTP was ordered which was negative.  He was seen in the ED by Dr. Amada Jupiter ( Neuro). There was concern for possibility of Thalmic stroke, emergent  MRI was done which was negative for acute infarct. Continuous EEG monitoring has been ordered. He was loaded with 1 gram of Keppra. He was intubated in the ED for airway protection.    Labs in the Ed were significant for  K of 2.6 , Na of 133, Glucose of 592, then 616., CO2 25, WBC of 13,000 Home medication include Glucotrol and Metformin UA with > 300 protein and > 500 glucose. Initial ABG: 7/37 /59.2 /261/ 34.6 Orders were written for insulin gtt.  Pt was biting ETT, and he was  started on Propofol and Fentanyl for sedation. Urine Drug screen is pending. PCCM were asked to admit and manage care.  Past Medical History   Past Medical History:  Diagnosis Date  . Chronic kidney disease   . Diabetes mellitus without complication (HCC)   . Elevated serum cholesterol 04/2019  . Elevated serum creatinine 04/2019  . History of seizures   . Hyperglycemia   . Hypertension   . Hypertensive urgency 04/2019  . Hypokalemia   . Seizures (HCC) 03/2017   x 2   . Stroke (HCC) 03/2017  . Substance abuse (HCC)   . Vitamin D deficiency 04/2019    Significant Hospital Events   5/30: Admission  Consults:  5/30 Neuro >> Code Stroke RO 5/30 PCCM  Procedures:    Significant Diagnostic Tests:  5/30 CTA Head / Cerebral Perfusion No emergent large vessel occlusion or high-grade stenosis. Areas of calculated ischemia are likely artifactual.  09/04/2019 MRI Head No evidence of recent infarction, hemorrhage, or mass. Mild cerebral volume loss and chronic microvascular ischemic changes  09/04/2019 CTA Chest No pulmonary embolism. Dilated main pulmonary artery, suggesting pulmonary arterial hypertension. Mild cardiomegaly. Mosaic attenuation throughout both lungs, nonspecific, probably due to mosaic perfusion from pulmonary vascular disease. Right pulmonary nodules, largest 3 mm. No follow-up needed if patient is low-risk   Micro Data:  5/30: SARS COVID 2 >>  Negative  Antimicrobials:  None   Interim history/subjective:  Sedated and Intubated Currently on propofol  gtt  Hypertensive on Cardene gtt.  Objective   Blood pressure (!) 142/94, pulse 94, resp. rate 14, weight 107.7 kg, SpO2 99 %.    Vent Mode: PRVC FiO2 (%):  [100 %] 100 % Set Rate:  [14 bmp] 14 bmp Vt Set:  [640 mL] 640 mL PEEP:  [5 cmH20] 5 cmH20 Plateau Pressure:  [24 cmH20] 24 cmH20  No intake or output data in the 24 hours ending 09/04/19 0750 Filed Weights   09/04/19 0453  Weight:  107.7 kg    Examination: General: Sedated and intubated male supine on stretcher  HENT: NCAT, Oral ETT, OG Tube  Lungs: Bilateral Chest excursion, Clear throughout Cardiovascular: S1, S2, RRR, No MRG Abdomen: Soft, ND, BS diminished  Extremities: No obvious deformities, no edema, brisk capillary refill Neuro: Sedated and intubated, follows no commands GU: Foley to gravity with clear amber Urine noted  Resolved Hospital Problem list     Assessment & Plan:  Seizure in setting of hyperglycemia CTA/ MRI Brain Negative for stroke Plan Per Neurology Trend Mag and replete as needed Continuous EEG as ordered Continue Keppra as ordered Neuro checks per untit protocol Seizure precautions  Acute Respiratory Failure in setting of seizure Intubation for airway protection Hypercarbic on initial ABG Plan Titrate FiO2 and PEEP for sats > 92% VAP protocol HOB elevated > 30 degrees Titrate Sedation for RASS Goal -1 ABG upon arrival to ICU ABG in am and prn CXR in am and prn SBT in am if neuro status allows  Hyperglycemia / History of DM Plan Insulin gtt as ordered CBG Q 1 Will transition to SSI when blood sugars allow Trend BMET  Hypokalemia Plan NS at 75/hr Trend BMET as serum Glucose corrects on insulin gtt Replete as needed   HTN Plan Continue Cardene gtt BP Goal 140- 160 systolic Hold home HCTZ/Lopressor/Apresoline and Norvasc for now   Best practice:  Diet: NPO Pain/Anxiety/Delirium protocol (if indicated): Propofol/ Fentanyl/ versed prn VAP protocol (if indicated): Initiated DVT prophylaxis: PAS hose GI prophylaxis: Protonix Glucose control: CBG Q1 on insulin gtt Mobility: BR Code Status: Full Family Communication: No family in ED 5/30 Disposition: ICU  Labs   CBC: Recent Labs  Lab 09/04/19 0506 09/04/19 0512 09/04/19 0612  WBC 13.0*  --   --   NEUTROABS 7.5  --   --   HGB 14.1 15.6 16.3  HCT 44.3 46.0 48.0  MCV 78.5*  --   --   PLT 322  --    --     Basic Metabolic Panel: Recent Labs  Lab 09/04/19 0506 09/04/19 0512 09/04/19 0612  NA 133* 136 134*  K 2.6* 2.6* 3.1*  CL 91* 92*  --   CO2 25  --   --   GLUCOSE 592* 616*  --   BUN 16 18  --   CREATININE 1.85* 2.20*  --   CALCIUM 9.4  --   --    GFR: Estimated Creatinine Clearance: 53.4 mL/min (A) (by C-G formula based on SCr of 2.2 mg/dL (H)). Recent Labs  Lab 09/04/19 0506  WBC 13.0*    Liver Function Tests: Recent Labs  Lab 09/04/19 0506  AST 30  ALT 21  ALKPHOS 109  BILITOT 0.9  PROT 7.3  ALBUMIN 3.7   No results for input(s): LIPASE, AMYLASE in the last 168 hours. No results for input(s): AMMONIA in the last 168  hours.  ABG    Component Value Date/Time   PHART 7.375 09/04/2019 0612   PCO2ART 59.2 (H) 09/04/2019 0612   PO2ART 261 (H) 09/04/2019 0612   HCO3 34.6 (H) 09/04/2019 0612   TCO2 36 (H) 09/04/2019 0612   ACIDBASEDEF 0.8 03/24/2017 0330   O2SAT 100.0 09/04/2019 0612     Coagulation Profile: Recent Labs  Lab 09/04/19 0506  INR 1.0    Cardiac Enzymes: No results for input(s): CKTOTAL, CKMB, CKMBINDEX, TROPONINI in the last 168 hours.  HbA1C: Hemoglobin A1C  Date/Time Value Ref Range Status  04/19/2019 10:38 AM 7.9 (A) 4.0 - 5.6 % Final  01/04/2018 11:43 AM 8.7 (A) 4.0 - 5.6 % Final   Hgb A1c MFr Bld  Date/Time Value Ref Range Status  06/04/2017 11:11 AM 7.0 (H) 4.8 - 5.6 % Final    Comment:             Prediabetes: 5.7 - 6.4          Diabetes: >6.4          Glycemic control for adults with diabetes: <7.0   03/25/2017 02:22 AM 11.2 (H) 4.8 - 5.6 % Final    Comment:    (NOTE) Pre diabetes:          5.7%-6.4% Diabetes:              >6.4% Glycemic control for   <7.0% adults with diabetes     CBG: No results for input(s): GLUCAP in the last 168 hours.  Review of Systems:   Unable intubated and sedated  Past Medical History  He,  has a past medical history of Chronic kidney disease, Diabetes mellitus without  complication ([place]), Elevated serum cholesterol (04/2019), Elevated serum creatinine (04/2019), History of seizures, Hyperglycemia, Hypertension, Hypertensive urgency (04/2019), Hypokalemia, Seizures ([place]) (03/2017), Stroke ([place]) (03/2017), Substance abuse ([place]), and Vitamin D deficiency (04/2019).   Surgical History   No past surgical history on file.   Social History   reports that he quit smoking about 20 years ago. His smoking use included cigarettes. He has never used smokeless tobacco. He reports current alcohol use. He reports current drug use. Drugs: Cocaine and Marijuana.   Family History   His family history includes Diabetes in his father and sister; Heart attack in his father; Hypertension in his brother.   Allergies No Known Allergies   Home Medications  Prior to Admission medications   Medication Sig Start Date End Date Taking? Authorizing Provider  albuterol (PROVENTIL) (2.5 MG/3ML) 0.083% nebulizer solution Take 3 mLs (2.5 mg total) by nebulization every 6 (six) hours as needed for wheezing or shortness of breath. Patient not taking: Reported on 04/21/2019 04/19/19   Azzie Glatter, FNP  albuterol (VENTOLIN HFA) 108 (90 Base) MCG/ACT inhaler Inhale 2 puffs into the lungs every 6 (six) hours as needed for wheezing or shortness of breath. Patient not taking: Reported on 04/21/2019 04/19/19   Azzie Glatter, FNP  amLODipine (NORVASC) 10 MG tablet Take 1 tablet (10 mg total) by mouth daily. Patient not taking: Reported on 04/21/2019 03/23/19   Virgel Manifold, MD  amLODipine (NORVASC) 10 MG tablet Take 1 tablet (10 mg total) by mouth daily. Patient not taking: Reported on 04/21/2019 04/19/19   Azzie Glatter, FNP  aspirin EC 81 MG tablet Take 1 tablet (81 mg total) by mouth daily as needed (pain/headache). Patient not taking: Reported on 04/21/2019 04/19/19   Azzie Glatter, FNP  atorvastatin (LIPITOR) 10 MG tablet  Take 1 tablet (10 mg total) by mouth daily. 04/25/19   [name], FNP  Blood Glucose Monitoring Suppl (TRUE METRIX AIR GLUCOSE METER) DEVI 1 each by Does not apply route 4 (four) times daily -  before meals and at bedtime. Patient not taking: Reported on 04/21/2019 04/19/19   [name], FNP  glipiZIDE (GLUCOTROL XL) 10 MG 24 hr tablet Take 1 tablet (10 mg total) by mouth daily with breakfast. Patient not taking: Reported on 04/21/2019 04/19/19   [name], FNP  glucose blood (TRUE METRIX BLOOD GLUCOSE TEST) test strip Use as instructed Patient not taking: Reported on 04/21/2019 04/19/19   [name], FNP  hydrALAZINE (APRESOLINE) 100 MG tablet Take 1 tablet (100 mg total) by mouth 3 (three) times daily. Patient not taking: Reported on 04/21/2019 04/19/19   [name], FNP  hydrochlorothiazide (HYDRODIURIL) 25 MG tablet Take 1 tablet (25 mg total) by mouth daily. Patient not taking: Reported on 04/21/2019 04/19/19   [name], FNP  Insulin Glargine (LANTUS SOLOSTAR) 100 UNIT/ML Solostar Pen Inject 20 Units into the skin daily at 10 pm. Patient not taking: Reported on 01/04/2018 10/05/17   Massie Maroon, FNP  Insulin Pen Needle (PEN NEEDLES) 31G X 6 MM MISC 1 each by Does not apply route at bedtime. Patient not taking: Reported on 04/21/2019 10/05/17   Massie Maroon, FNP  INSULIN SYRINGE .5CC/29G (B-D INS SYR ULTRAFINE .5CC/29G) 29G X 1/2" 0.5 ML MISC 1 each by Does not apply route at bedtime. Patient not taking: Reported on 04/21/2019 04/02/17   Massie Maroon, FNP  Lancets MISC 1 each by Does not apply route 4 (four) times daily -  before meals and at bedtime. Patient not taking: Reported on 04/21/2019 04/19/19   [name], FNP  levETIRAcetam (KEPPRA) 500 MG tablet Take 1 tablet (500 mg total) by mouth 2 (two) times daily. Patient not taking: Reported on 04/21/2019 04/19/19   [name], FNP  metFORMIN (GLUCOPHAGE) 1000 MG tablet Take 1 tablet (1,000 mg total) by mouth 2 (two) times daily with a  meal. Patient not taking: Reported on 04/21/2019 04/19/19   [name], FNP  metoprolol tartrate (LOPRESSOR) 25 MG tablet Take 1 tablet (25 mg total) by mouth 3 (three) times daily. Patient not taking: Reported on 04/21/2019 01/04/18   Mike Gip, FNP  metoprolol tartrate (LOPRESSOR) 50 MG tablet Take 1 tablet (50 mg total) by mouth 2 (two) times daily. Patient not taking: Reported on 04/21/2019 04/19/19   [name], FNP  potassium chloride (KLOR-CON) 10 MEQ tablet Take 1 tablet (10 mEq total) by mouth daily. Patient not taking: Reported on 04/21/2019 04/19/19   [name], FNP  Vitamin D, Ergocalciferol, (DRISDOL) 1.25 MG (50000 UNIT) CAPS capsule Take 1 capsule (50,000 Units total) by mouth every 7 (seven) days. 04/25/19   [name], FNP     Critical care time: 65 minutes    Bevelyn Ngo, MSN, AGACNP-BC [facility] Pulmonary/Critical Care Medicine See Amion for personal pager PCCM on call pager [phone] 09/04/2019 8:43 AM

## 2019-09-04 NOTE — Progress Notes (Signed)
*  PRELIMINARY RESULTS* Echocardiogram 2D Echocardiogram has been performed.  Micheal Levy 09/04/2019, 12:10 PM

## 2019-09-04 NOTE — Code Documentation (Signed)
Responded to Code Stroke called at bridge of ED. Per EMS, pt had a seizure at home and was post-ictal on their arrival. Pt then became aphasia en route to ED. Pt arrived at 0446, LSN determined to be midnight tonight. CBG-616, NIH-15, CT head negative for acute changes, CTA-no LVO, CTP-27cc-no core infarct. Pt taken back to ED and intubated for airway protection. Pt transported to MRI at 0615. Code Stroke cancelled at 0645.

## 2019-09-04 NOTE — ED Provider Notes (Addendum)
[facility] EMERGENCY DEPARTMENT Provider Note   CSN: [id number] Arrival date & time: 09/04/19  0448     History Chief Complaint  Patient presents with  . Code Stroke    JOHANNES EVERAGE is a 39 y.o. male.  Level 5 caveat for altered mental status. Patient brought in by EMS after call for seizure. He appeared postictal on EMS arrival. In route to the hospital he became apparently aphasic and not answering questions appropriately and not making sense. Code stroke activated on arrival to the ED. Patient initially able to state his name but became more aphasic with slurred speech after arrival. He is having trouble following commands and difficulty holding still. He was incontinent of urine for EMS.  Chart review shows history of CKD, hyperglycemia, hypertension, seizures, stroke, cocaine abuse.  The history is provided by the patient and the EMS personnel. The history is limited by the condition of the patient.       Past Medical History:  Diagnosis Date  . Chronic kidney disease   . Diabetes mellitus without complication ([place])   . Elevated serum cholesterol 04/2019  . Elevated serum creatinine 04/2019  . History of seizures   . Hyperglycemia   . Hypertension   . Hypertensive urgency 04/2019  . Hypokalemia   . Seizures ([place]) 03/2017   x 2   . Stroke ([place]) 03/2017  . Substance abuse ([place])   . Vitamin D deficiency 04/2019    Patient Active Problem List   Diagnosis Date Noted  . Hypertensive urgency 04/21/2019  . Essential hypertension 04/21/2019  . Uncontrolled type 2 diabetes mellitus with hyperglycemia ([place]) 04/21/2019  . Hemoglobin A1C between 7% and 9% indicating borderline diabetic control 04/21/2019  . Incarceration 04/21/2019  . History of seizure 04/21/2019  . Hypokalemia 04/21/2019  . History of substance abuse ([place]) 04/05/2017  . Cerebral embolism with cerebral infarction 03/23/2017  . Acute renal failure (ARF) ([place])   . Hypertensive  emergency   . Ventilator dependent ([place])   . Hyperglycemia 03/21/2017    No past surgical history on file.     Family History  Problem Relation Age of Onset  . Diabetes Father   . Heart attack Father   . Diabetes Sister   . Hypertension Brother     Social History   Tobacco Use  . Smoking status: Former Smoker    Types: Cigarettes    Quit date: 04/25/1999    Years since quitting: 20.3  . Smokeless tobacco: Never Used  . Tobacco comment: 2001  Substance Use Topics  . Alcohol use: Yes    Comment: occ, none since Dec 2018  . Drug use: Yes    Types: Cocaine, Marijuana    Comment: weed-daily. cocaine- once or twice a month, 05/25/17 avg 1/2 gm daily, no cocaine since Dec 2018     Home Medications Prior to Admission medications   Medication Sig Start Date End Date Taking? Authorizing Provider  albuterol (PROVENTIL) (2.5 MG/3ML) 0.083% nebulizer solution Take 3 mLs (2.5 mg total) by nebulization every 6 (six) hours as needed for wheezing or shortness of breath. Patient not taking: Reported on 04/21/2019 04/19/19   Azzie Glatter, FNP  albuterol (VENTOLIN HFA) 108 (90 Base) MCG/ACT inhaler Inhale 2 puffs into the lungs every 6 (six) hours as needed for wheezing or shortness of breath. Patient not taking: Reported on 04/21/2019 04/19/19   Azzie Glatter, FNP  amLODipine (NORVASC) 10 MG tablet Take 1 tablet (10 mg total)  by mouth daily. Patient not taking: Reported on 04/21/2019 03/23/19   Virgel Manifold, MD  amLODipine (NORVASC) 10 MG tablet Take 1 tablet (10 mg total) by mouth daily. Patient not taking: Reported on 04/21/2019 04/19/19   Azzie Glatter, FNP  aspirin EC 81 MG tablet Take 1 tablet (81 mg total) by mouth daily as needed (pain/headache). Patient not taking: Reported on 04/21/2019 04/19/19   Azzie Glatter, FNP  atorvastatin (LIPITOR) 10 MG tablet Take 1 tablet (10 mg total) by mouth daily. 04/25/19   Azzie Glatter, FNP  Blood Glucose Monitoring Suppl (TRUE  METRIX AIR GLUCOSE METER) DEVI 1 each by Does not apply route 4 (four) times daily -  before meals and at bedtime. Patient not taking: Reported on 04/21/2019 04/19/19   Azzie Glatter, FNP  glipiZIDE (GLUCOTROL XL) 10 MG 24 hr tablet Take 1 tablet (10 mg total) by mouth daily with breakfast. Patient not taking: Reported on 04/21/2019 04/19/19   Azzie Glatter, FNP  glucose blood (TRUE METRIX BLOOD GLUCOSE TEST) test strip Use as instructed Patient not taking: Reported on 04/21/2019 04/19/19   Azzie Glatter, FNP  hydrALAZINE (APRESOLINE) 100 MG tablet Take 1 tablet (100 mg total) by mouth 3 (three) times daily. Patient not taking: Reported on 04/21/2019 04/19/19   Azzie Glatter, FNP  hydrochlorothiazide (HYDRODIURIL) 25 MG tablet Take 1 tablet (25 mg total) by mouth daily. Patient not taking: Reported on 04/21/2019 04/19/19   Azzie Glatter, FNP  Insulin Glargine (LANTUS SOLOSTAR) 100 UNIT/ML Solostar Pen Inject 20 Units into the skin daily at 10 pm. Patient not taking: Reported on 01/04/2018 10/05/17   Dorena Dew, FNP  Insulin Pen Needle (PEN NEEDLES) 31G X 6 MM MISC 1 each by Does not apply route at bedtime. Patient not taking: Reported on 04/21/2019 10/05/17   Dorena Dew, FNP  INSULIN SYRINGE .5CC/29G (B-D INS SYR ULTRAFINE .5CC/29G) 29G X 1/2" 0.5 ML MISC 1 each by Does not apply route at bedtime. Patient not taking: Reported on 04/21/2019 04/02/17   Dorena Dew, FNP  Lancets MISC 1 each by Does not apply route 4 (four) times daily -  before meals and at bedtime. Patient not taking: Reported on 04/21/2019 04/19/19   Azzie Glatter, FNP  levETIRAcetam (KEPPRA) 500 MG tablet Take 1 tablet (500 mg total) by mouth 2 (two) times daily. Patient not taking: Reported on 04/21/2019 04/19/19   Azzie Glatter, FNP  metFORMIN (GLUCOPHAGE) 1000 MG tablet Take 1 tablet (1,000 mg total) by mouth 2 (two) times daily with a meal. Patient not taking: Reported on 04/21/2019 04/19/19    Azzie Glatter, FNP  metoprolol tartrate (LOPRESSOR) 25 MG tablet Take 1 tablet (25 mg total) by mouth 3 (three) times daily. Patient not taking: Reported on 04/21/2019 01/04/18   Lanae Boast, FNP  metoprolol tartrate (LOPRESSOR) 50 MG tablet Take 1 tablet (50 mg total) by mouth 2 (two) times daily. Patient not taking: Reported on 04/21/2019 04/19/19   Azzie Glatter, FNP  potassium chloride (KLOR-CON) 10 MEQ tablet Take 1 tablet (10 mEq total) by mouth daily. Patient not taking: Reported on 04/21/2019 04/19/19   Azzie Glatter, FNP  Vitamin D, Ergocalciferol, (DRISDOL) 1.25 MG (50000 UNIT) CAPS capsule Take 1 capsule (50,000 Units total) by mouth every 7 (seven) days. 04/25/19   Azzie Glatter, FNP    Allergies    Patient has no known allergies.  Review of Systems   Review  of Systems  Unable to perform ROS: Mental status change    Physical Exam Updated Vital Signs BP (!) 142/94   Pulse 94   Resp 14   Wt 107.7 kg   SpO2 99%   BMI 35.06 kg/m   Physical Exam Vitals and nursing note reviewed.  Constitutional:      General: He is not in acute distress.    Appearance: He is well-developed. He is obese. He is ill-appearing.     Comments: Agitated, moving all extremities, does not follow commands,  Initially able to say his name, Speech became more slurred and aphasic after arrival.  HENT:     Head: Normocephalic and atraumatic.     Mouth/Throat:     Pharynx: No oropharyngeal exudate.  Eyes:     Conjunctiva/sclera: Conjunctivae normal.     Pupils: Pupils are equal, round, and reactive to light.  Neck:     Comments: No meningismus. Cardiovascular:     Rate and Rhythm: Normal rate and regular rhythm.     Heart sounds: Normal heart sounds. No murmur.  Pulmonary:     Effort: Pulmonary effort is normal. No respiratory distress.     Breath sounds: Normal breath sounds.  Abdominal:     Palpations: Abdomen is soft.     Tenderness: There is no abdominal tenderness. There  is no guarding or rebound.  Musculoskeletal:        General: No tenderness. Normal range of motion.     Cervical back: Normal range of motion and neck supple.  Skin:    General: Skin is warm.  Neurological:     Mental Status: He is alert.     Motor: No abnormal muscle tone.     Comments: Does not follow commands, Moves L side more than R. Appears to have R sided weakness on exam. Questionable R facial droop.  Psychiatric:        Behavior: Behavior normal.     ED Results / Procedures / Treatments   Labs (all labs ordered are listed, but only abnormal results are displayed) Labs Reviewed  APTT - Abnormal; Notable for the following components:      Result Value   aPTT 20 (*)    All other components within normal limits  CBC - Abnormal; Notable for the following components:   WBC 13.0 (*)    MCV 78.5 (*)    MCH 25.0 (*)    All other components within normal limits  DIFFERENTIAL - Abnormal; Notable for the following components:   Lymphs Abs 4.6 (*)    All other components within normal limits  COMPREHENSIVE METABOLIC PANEL - Abnormal; Notable for the following components:   Sodium 133 (*)    Potassium 2.6 (*)    Chloride 91 (*)    Glucose, Bld 592 (*)    Creatinine, Ser 1.85 (*)    GFR calc non Af Amer 44 (*)    GFR calc Af Amer 51 (*)    Anion gap 17 (*)    All other components within normal limits  URINALYSIS, ROUTINE W REFLEX MICROSCOPIC - Abnormal; Notable for the following components:   Color, Urine STRAW (*)    Glucose, UA >=500 (*)    Protein, ur >=300 (*)    All other components within normal limits  I-STAT CHEM 8, ED - Abnormal; Notable for the following components:   Potassium 2.6 (*)    Chloride 92 (*)    Creatinine, Ser 2.20 (*)    Glucose,  Bld 616 (*)    All other components within normal limits  POCT I-STAT 7, (LYTES, BLD GAS, ICA,H+H) - Abnormal; Notable for the following components:   pCO2 arterial 59.2 (*)    pO2, Arterial 261 (*)    Bicarbonate  34.6 (*)    TCO2 36 (*)    Acid-Base Excess 7.0 (*)    Sodium 134 (*)    Potassium 3.1 (*)    All other components within normal limits  SARS CORONAVIRUS 2 BY RT PCR (HOSPITAL ORDER, [facility])  PROTIME-INR  ETHANOL  RAPID URINE DRUG SCREEN, HOSP PERFORMED  TRIGLYCERIDES  MAGNESIUM  HIV ANTIBODY (ROUTINE TESTING W REFLEX)  BRAIN NATRIURETIC PEPTIDE  BLOOD GAS, ARTERIAL  CBG MONITORING, ED  I-STAT CHEM 8, ED  CBG MONITORING, ED  TROPONIN I (HIGH SENSITIVITY)    EKG EKG Interpretation  Date/Time:  Sunday Sep 04 2019 05:26:52 EDT Ventricular Rate:  98 PR Interval:    QRS Duration: 94 QT Interval:  394 QTC Calculation: 504 R Axis:   32 Text Interpretation: Sinus rhythm Left atrial enlargement Probable LVH with secondary repol abnrm Anterior ST elevation, probably due to LVH Prolonged QT interval anterior ST elevation more pronounced in v4 Confirmed by Ezequiel Essex [phone]) on 09/04/2019 6:08:51 AM   Radiology CT Code Stroke CTA Head W/WO contrast  Result Date: 09/04/2019 CLINICAL DATA:  Aphasia EXAM: CT ANGIOGRAPHY HEAD AND NECK CT PERFUSION BRAIN TECHNIQUE: Multidetector CT imaging of the head and neck was performed using the standard protocol during bolus administration of intravenous contrast. Multiplanar CT image reconstructions and MIPs were obtained to evaluate the vascular anatomy. Carotid stenosis measurements (when applicable) are obtained utilizing NASCET criteria, using the distal internal carotid diameter as the denominator. Multiphase CT imaging of the brain was performed following IV bolus contrast injection. Subsequent parametric perfusion maps were calculated using RAPID software. CONTRAST:  154m OMNIPAQUE IOHEXOL 350 MG/ML SOLN COMPARISON:  None. FINDINGS: CTA NECK FINDINGS SKELETON: There is no bony spinal canal stenosis. No lytic or blastic lesion. OTHER NECK: Normal pharynx, larynx and major salivary glands. No cervical  lymphadenopathy. Unremarkable thyroid gland. UPPER CHEST: No pneumothorax or pleural effusion. No nodules or masses. AORTIC ARCH: There is no calcific atherosclerosis of the aortic arch. There is no aneurysm, dissection or hemodynamically significant stenosis of the visualized portion of the aorta. Conventional 3 vessel aortic branching pattern. The visualized proximal subclavian arteries are widely patent. RIGHT CAROTID SYSTEM: Normal without aneurysm, dissection or stenosis. LEFT CAROTID SYSTEM: Normal without aneurysm, dissection or stenosis. VERTEBRAL ARTERIES: Left dominant configuration. Both origins are clearly patent. There is no dissection, occlusion or flow-limiting stenosis to the skull base (V1-V3 segments). CTA HEAD FINDINGS POSTERIOR CIRCULATION: --Vertebral arteries: Normal V4 segments. --Inferior cerebellar arteries: Normal. --Basilar artery: Normal. --Superior cerebellar arteries: Normal. --Posterior cerebral arteries (PCA): Normal. ANTERIOR CIRCULATION: --Intracranial internal carotid arteries: Normal. --Anterior cerebral arteries (ACA): Normal. Both A1 segments are present. Patent anterior communicating artery (a-comm). --Middle cerebral arteries (MCA): Normal. VENOUS SINUSES: As permitted by contrast timing, patent. ANATOMIC VARIANTS: None Review of the MIP images confirms the above findings. CT Brain Perfusion Findings: ASPECTS: 10 CBF (<30%) Volume: 045mPerfusion (Tmax>6.0s) volume: 2711mismatch Volume: 48m25mfarction Location:The areas of ischemia calculated by the RAPID software do not conform to any coherent pattern and are likely artifacts caused by patient motion during the acquisition. IMPRESSION: 1. No emergent large vessel occlusion or high-grade stenosis. 2. Motion-degraded CT perfusion scan. Areas of calculated ischemia are  likely artifactual. Electronically Signed   By: Ulyses Jarred M.D.   On: 09/04/2019 05:58   CT Code Stroke CTA Neck W/WO contrast  Result Date:  09/04/2019 CLINICAL DATA:  Aphasia EXAM: CT ANGIOGRAPHY HEAD AND NECK CT PERFUSION BRAIN TECHNIQUE: Multidetector CT imaging of the head and neck was performed using the standard protocol during bolus administration of intravenous contrast. Multiplanar CT image reconstructions and MIPs were obtained to evaluate the vascular anatomy. Carotid stenosis measurements (when applicable) are obtained utilizing NASCET criteria, using the distal internal carotid diameter as the denominator. Multiphase CT imaging of the brain was performed following IV bolus contrast injection. Subsequent parametric perfusion maps were calculated using RAPID software. CONTRAST:  176m OMNIPAQUE IOHEXOL 350 MG/ML SOLN COMPARISON:  None. FINDINGS: CTA NECK FINDINGS SKELETON: There is no bony spinal canal stenosis. No lytic or blastic lesion. OTHER NECK: Normal pharynx, larynx and major salivary glands. No cervical lymphadenopathy. Unremarkable thyroid gland. UPPER CHEST: No pneumothorax or pleural effusion. No nodules or masses. AORTIC ARCH: There is no calcific atherosclerosis of the aortic arch. There is no aneurysm, dissection or hemodynamically significant stenosis of the visualized portion of the aorta. Conventional 3 vessel aortic branching pattern. The visualized proximal subclavian arteries are widely patent. RIGHT CAROTID SYSTEM: Normal without aneurysm, dissection or stenosis. LEFT CAROTID SYSTEM: Normal without aneurysm, dissection or stenosis. VERTEBRAL ARTERIES: Left dominant configuration. Both origins are clearly patent. There is no dissection, occlusion or flow-limiting stenosis to the skull base (V1-V3 segments). CTA HEAD FINDINGS POSTERIOR CIRCULATION: --Vertebral arteries: Normal V4 segments. --Inferior cerebellar arteries: Normal. --Basilar artery: Normal. --Superior cerebellar arteries: Normal. --Posterior cerebral arteries (PCA): Normal. ANTERIOR CIRCULATION: --Intracranial internal carotid arteries: Normal. --Anterior  cerebral arteries (ACA): Normal. Both A1 segments are present. Patent anterior communicating artery (a-comm). --Middle cerebral arteries (MCA): Normal. VENOUS SINUSES: As permitted by contrast timing, patent. ANATOMIC VARIANTS: None Review of the MIP images confirms the above findings. CT Brain Perfusion Findings: ASPECTS: 10 CBF (<30%) Volume: 04mPerfusion (Tmax>6.0s) volume: 2780mismatch Volume: 81m49mfarction Location:The areas of ischemia calculated by the RAPID software do not conform to any coherent pattern and are likely artifacts caused by patient motion during the acquisition. IMPRESSION: 1. No emergent large vessel occlusion or high-grade stenosis. 2. Motion-degraded CT perfusion scan. Areas of calculated ischemia are likely artifactual. Electronically Signed   By: KeviUlyses Jarred.   On: 09/04/2019 05:58   CT Code Stroke Cerebral Perfusion with contrast  Result Date: 09/04/2019 CLINICAL DATA:  Aphasia EXAM: CT ANGIOGRAPHY HEAD AND NECK CT PERFUSION BRAIN TECHNIQUE: Multidetector CT imaging of the head and neck was performed using the standard protocol during bolus administration of intravenous contrast. Multiplanar CT image reconstructions and MIPs were obtained to evaluate the vascular anatomy. Carotid stenosis measurements (when applicable) are obtained utilizing NASCET criteria, using the distal internal carotid diameter as the denominator. Multiphase CT imaging of the brain was performed following IV bolus contrast injection. Subsequent parametric perfusion maps were calculated using RAPID software. CONTRAST:  100mL74mIPAQUE IOHEXOL 350 MG/ML SOLN COMPARISON:  None. FINDINGS: CTA NECK FINDINGS SKELETON: There is no bony spinal canal stenosis. No lytic or blastic lesion. OTHER NECK: Normal pharynx, larynx and major salivary glands. No cervical lymphadenopathy. Unremarkable thyroid gland. UPPER CHEST: No pneumothorax or pleural effusion. No nodules or masses. AORTIC ARCH: There is no calcific  atherosclerosis of the aortic arch. There is no aneurysm, dissection or hemodynamically significant stenosis of the visualized portion of the aorta. Conventional 3 vessel aortic branching pattern. The  visualized proximal subclavian arteries are widely patent. RIGHT CAROTID SYSTEM: Normal without aneurysm, dissection or stenosis. LEFT CAROTID SYSTEM: Normal without aneurysm, dissection or stenosis. VERTEBRAL ARTERIES: Left dominant configuration. Both origins are clearly patent. There is no dissection, occlusion or flow-limiting stenosis to the skull base (V1-V3 segments). CTA HEAD FINDINGS POSTERIOR CIRCULATION: --Vertebral arteries: Normal V4 segments. --Inferior cerebellar arteries: Normal. --Basilar artery: Normal. --Superior cerebellar arteries: Normal. --Posterior cerebral arteries (PCA): Normal. ANTERIOR CIRCULATION: --Intracranial internal carotid arteries: Normal. --Anterior cerebral arteries (ACA): Normal. Both A1 segments are present. Patent anterior communicating artery (a-comm). --Middle cerebral arteries (MCA): Normal. VENOUS SINUSES: As permitted by contrast timing, patent. ANATOMIC VARIANTS: None Review of the MIP images confirms the above findings. CT Brain Perfusion Findings: ASPECTS: 10 CBF (<30%) Volume: 22m Perfusion (Tmax>6.0s) volume: 230mMismatch Volume: 2759mnfarction Location:The areas of ischemia calculated by the RAPID software do not conform to any coherent pattern and are likely artifacts caused by patient motion during the acquisition. IMPRESSION: 1. No emergent large vessel occlusion or high-grade stenosis. 2. Motion-degraded CT perfusion scan. Areas of calculated ischemia are likely artifactual. Electronically Signed   By: KevUlyses JarredD.   On: 09/04/2019 05:58   CT ANGIO CHEST AORTA W/CM & OR WO/CM  Result Date: 09/04/2019 CLINICAL DATA:  Aphasia.  Code stroke. EXAM: CT ANGIOGRAPHY CHEST WITH CONTRAST TECHNIQUE: Multidetector CT imaging of the chest was performed using the  standard protocol during bolus administration of intravenous contrast. Multiplanar CT image reconstructions and MIPs were obtained to evaluate the vascular anatomy. CONTRAST:  100m27mNIPAQUE IOHEXOL 350 MG/ML SOLN COMPARISON:  12/15/2016 chest radiograph.  03/22/2017 chest CT. FINDINGS: Cardiovascular: The study is high quality for the evaluation of pulmonary embolism. There are no filling defects in the central, lobar, segmental or subsegmental pulmonary artery branches to suggest acute pulmonary embolism. Normal course and caliber of the thoracic aorta. Dilated main pulmonary artery (3.8 cm diameter). Mild cardiomegaly. No significant pericardial fluid/thickening. Mediastinum/Nodes: No discrete thyroid nodules. Unremarkable esophagus. No pathologically enlarged axillary, mediastinal or hilar lymph nodes. Lungs/Pleura: No pneumothorax. No pleural effusion. No acute consolidative airspace disease or lung masses. Few scattered small solid right pulmonary nodules, largest 3 mm in peripheral right upper lobe (series 7/image 77), not definitely seen on prior chest CT. Mosaic attenuation throughout both lungs. Upper abdomen: No acute abnormality. Musculoskeletal:  No aggressive appearing focal osseous lesions. Review of the MIP images confirms the above findings. IMPRESSION: 1. No pulmonary embolism. 2. Dilated main pulmonary artery, suggesting pulmonary arterial hypertension. 3. Mild cardiomegaly. 4. Mosaic attenuation throughout both lungs, nonspecific, probably due to mosaic perfusion from pulmonary vascular disease. 5. Right pulmonary nodules, largest 3 mm. No follow-up needed if patient is low-risk (and has no known or suspected primary neoplasm). Non-contrast chest CT can be considered in 12 months if patient is high-risk. This recommendation follows the consensus statement: Guidelines for Management of Incidental Pulmonary Nodules Detected on CT Images: From the Fleischner Society 2017; Radiology 2017;  [id number]. Electronically Signed   By: JasoIlona Sorrel.   On: 09/04/2019 05:56   CT HEAD CODE STROKE WO CONTRAST  Result Date: 09/04/2019 CLINICAL DATA:  Code stroke.  Aphasia EXAM: CT HEAD WITHOUT CONTRAST TECHNIQUE: Contiguous axial images were obtained from the base of the skull through the vertex without intravenous contrast. COMPARISON:  03/21/2017 FINDINGS: Brain: There is no mass, hemorrhage or extra-axial collection. The size and configuration of the ventricles and extra-axial CSF spaces are normal. The brain parenchyma is normal, without evidence of  acute or chronic infarction. Right middle cranial fossa arachnoid cyst. Vascular: No abnormal hyperdensity of the major intracranial arteries or dural venous sinuses. No intracranial atherosclerosis. Skull: The visualized skull base, calvarium and extracranial soft tissues are normal. Sinuses/Orbits: No fluid levels or advanced mucosal thickening of the visualized paranasal sinuses. No mastoid or middle ear effusion. The orbits are normal. ASPECTS [facility] Stroke Program Early CT Score) - Ganglionic level infarction (caudate, lentiform nuclei, internal capsule, insula, M1-M3 cortex): 7 - Supraganglionic infarction (M4-M6 cortex): 3 Total score (0-10 with 10 being normal): 10 IMPRESSION: 1. No acute intracranial abnormality. 2. ASPECTS is 10. 3. These results were communicated to Dr. Roland Rack at 5:06 am on 09/04/2019 by text page via the [facility] messaging system. Electronically Signed   By: Ulyses Jarred M.D.   On: 09/04/2019 05:08    Procedures Procedure Name: Intubation Date/Time: 09/04/2019 6:16 AM Performed by: Ezequiel Essex, MD Pre-anesthesia Checklist: Patient identified, Patient being monitored, Emergency Drugs available, Timeout performed and Suction available Oxygen Delivery Method: Non-rebreather mask Preoxygenation: Pre-oxygenation with 100% oxygen Induction Type: Rapid sequence Ventilation: Two handed mask ventilation  required, Mask ventilation with difficulty and Oral airway inserted - appropriate to patient size Laryngoscope Size: Glidescope, 4 and Mac Grade View: Grade II Tube size: 7.5 mm Number of attempts: 2 Airway Equipment and Method: Video-laryngoscopy and Stylet Placement Confirmation: ETT inserted through vocal cords under direct vision,  CO2 detector and Breath sounds checked- equal and bilateral Secured at: 24 cm Tube secured with: ETT holder Dental Injury: Teeth and Oropharynx as per pre-operative assessment  Difficulty Due To: Difficult Airway- due to large tongue, Difficult Airway- due to reduced neck mobility, Difficulty was anticipated and Difficult Airway- due to limited oral opening Future Recommendations: Recommend- induction with short-acting agent, and alternative techniques readily available Comments: Initial intubation attempt by Mathews Argyle not successful. Patient with large tongue causing airway obstruction and hypoxia. Required oral airway and BVM to improve hypoxia before successful intubation attempt.    .Critical Care Performed by: Ezequiel Essex, MD Authorized by: Ezequiel Essex, MD   Critical care provider statement:    Critical care time (minutes):  60   Critical care was necessary to treat or prevent imminent or life-threatening deterioration of the following conditions:  CNS failure or compromise and respiratory failure   Critical care was time spent personally by me on the following activities:  Discussions with consultants, evaluation of patient's response to treatment, examination of patient, ordering and performing treatments and interventions, ordering and review of laboratory studies, ordering and review of radiographic studies, pulse oximetry, re-evaluation of patient's condition, obtaining history from patient or surrogate and review of old charts   (including critical care time)  Medications Ordered in ED Medications  sodium chloride flush (NS) 0.9 %  injection 3 mL (has no administration in time range)  levETIRAcetam (KEPPRA) IVPB 1000 mg/100 mL premix (has no administration in time range)  LORazepam (ATIVAN) 2 MG/ML injection (has no administration in time range)    ED Course  I have reviewed the triage vital signs and the nursing notes.  Pertinent labs & imaging results that were available during my care of the patient were reviewed by me and considered in my medical decision making (see chart for details).    MDM Rules/Calculators/A&P                     Altered mental status with history of seizure and substance abuse. Hypertensive over 240 for EMS. Code  stroke activated on arrival given new onset aphasia. Patient given Ativan for suspected seizure activity and loaded with Keppra.  Seen with Dr. Leonel Ramsay of neurology.  CT head is negative.  Patient with difficulty cooperating and difficulty laying still.  His last seen normal is unclear.  He apparently got up to use the bathroom around 2:30 AM but was not speaking at that time.  Did have questionable seizure activity at home and seemed to improve but became aphasic in route with weakness after moving his right side.  CTA shows no large vessel occlusion.  CT perfusion study is of poor quality likely artifactual. CTA chest negative for aortic arch dissection.  Dr. Leonel Ramsay of neurology plans to obtain MRI stat to rule out acute infarct which may need TPA despite unclear last seen normal.  Decision made to intubate prior to MRI.  CT angio chest shows no evidence of aortic dissection.  Does show anterior ST elevation on EKG similar to previous likely due to LVH. D/w Dr. Irish Lack of cardiology who reviewed EKG and reviewed similar to previous and likely represents LVH.   Labs with hyperglycemia and hypokalemia, normal anion gap.  MRI is negative for acute infarct.  Dr. Leonel Ramsay of neurology has decided against TPA.  He feels patient's presentation likely secondary to  hyperglycemia and possible seizure.  Agrees with Keppra, blood pressure control and glucose control.  Continue IV Keppra, IV insulin, potassium replacement.  Admission discussed with critical care team. EEG ordered by neurology   Final Clinical Impression(s) / ED Diagnoses Final diagnoses:  Altered mental status, unspecified altered mental status type  Right sided weakness  Hyperglycemia  Hypokalemia    Rx / DC Orders ED Discharge Orders    None       Canaan Prue, Annie Main, MD 09/04/19 7681    Ezequiel Essex, MD 09/04/19 [phone]

## 2019-09-04 NOTE — ED Notes (Signed)
EEG at the bedside  ?

## 2019-09-04 NOTE — Progress Notes (Signed)
LTM EEG hooked up and running - no initial skin breakdown - push button tested - neuro notified.  

## 2019-09-04 NOTE — ED Triage Notes (Signed)
[facility] called out for seizure and appeared postictal on ems arrival. Enroute pt stated he needed to pee; shortly after, pt became aphasic, not answering questions appropriately. Aphasia started at 0440. Code Stroke activated on arrival to ED, Dr.Rancour evaluated at the bridge and brought straight to CT. Per EMS, pt had c/o chest pain earlier in the day. 20g IV to L hand.    CBG 470; ems bp 253/169; tachycardic 130

## 2019-09-04 NOTE — ED Notes (Signed)
CCM made aware pt requiring more propofol than ordered. New orders for fentanyl push and fentanyl drip received. Will titrate down on propofol and titrate up on fentanyl per MD.

## 2019-09-04 NOTE — Progress Notes (Signed)
eLink Physician-Brief Progress Note Patient Name: Micheal Levy DOB: 05-Sep-1980 MRN: [id number]   Date of Service  09/04/2019  HPI/Events of Note  Bed side RN asking for fluids to continue or not. Off of endo tool. On SSI/long acting. On Vent. Notes, labs, meds reviewed. AG normal. co2 fine. NPO, on int NG suction-250 total  So far. Not having any ileus. On Sz medicines. EEG pending.   eICU Interventions  - continue D5half NS at 75 ml/hr. q4 CBG. If going > 180, stop it. Not starting TF yet, consider tomorrow if still on Vent.      Intervention Category Intermediate Interventions: Hyperglycemia - evaluation and treatment  Ranee Gosselin 09/04/2019, 9:06 PM

## 2019-09-04 NOTE — Consult Note (Signed)
Neurology Consultation Reason for Consult: Aphasia Referring Physician: Rancour, S  CC: Seizures  History is obtained from: chart  HPI: Micheal Levy is a 39 y.o. male with a history of seizures in the setting of hyperglycemia, previous stroke who was in his normal state of health when he went to bed.  He did wake up in the middle the night got up to use the bathroom, but it is unclear if he was completely normal when he did that.  This morning, around 3:45 am, his SO awoke to him having seizure activity. En route, he was speaking and moving his right side, but did not return to baseline.  After arrival to the emergency department, he developed a progressive aphasia, and then was noted to be hemiparetic on the right.  Given his history of stroke, diabetes, stroke was considered and a CTA/CTP was ordered which was negative.  Given the continued possibility that a thalamic stroke can sometimes mimic larger infarcts, I favored ruling out the possibility of a thalamic stroke which might still be amenable to TPA with wake-up criteria with an MRI.  At this point, his airway was tenuous due to mental status and he was not "to be able to be cooperative with MRI and therefore the decision was made to proceed with intubation.  He was taken for an emergent MRI which I reviewed diffusion images of, which were negative for acute ischemic infarct.   LKW: 12 am  tpa given?: no, not a stroke    ROS: A 14 point ROS was performed and is negative except as noted in the HPI.   Past Medical History:  Diagnosis Date  . Chronic kidney disease   . Diabetes mellitus without complication ([place])   . Elevated serum cholesterol 04/2019  . Elevated serum creatinine 04/2019  . History of seizures   . Hyperglycemia   . Hypertension   . Hypertensive urgency 04/2019  . Hypokalemia   . Seizures ([place]) 03/2017   x 2   . Stroke ([place]) 03/2017  . Substance abuse ([place])   . Vitamin D deficiency 04/2019     Family  History  Problem Relation Age of Onset  . Diabetes Father   . Heart attack Father   . Diabetes Sister   . Hypertension Brother      Social History:  reports that he quit smoking about 20 years ago. His smoking use included cigarettes. He has never used smokeless tobacco. He reports current alcohol use. He reports current drug use. Drugs: Cocaine and Marijuana.   Exam: Current vital signs: Wt 107.7 kg   BMI 35.06 kg/m  Vital signs in last 24 hours: Weight:  [107.7 kg] 107.7 kg (05/30 0453)   Physical Exam  Constitutional: Appears well-developed and well-nourished.  Psych: Affect appropriate to situation Eyes: No scleral injection HENT: No OP obstrucion MSK: no joint deformities.  Cardiovascular: Normal rate and regular rhythm.  Respiratory: Effort normal, non-labored breathing GI: Soft.  No distension. There is no tenderness.  Skin: WDI  Neuro: Mental Status: Patient is awake, alert, he has a fairly dense receptive aphasia, with fluent gibberish output.  He is able to follow occasional commands. Cranial Nerves: II: Does not clearly blink to threat from the right pupils are equal, round, and reactive to light, though this is slightly unclear significance because he only occasionally blinks to threat from the left..   III,IV, VI: EOMI without ptosis or diploplia.  VII: Facial movement with right facial weakness Motor: He has  little movement of the right leg, 2/5 and 3/5 right arm weakness sensory: Sensation is unclear due to aphasia, but he does respond to noxious stimulation bilaterally.   Cerebellar: Does not perform  I have reviewed labs in epic and the results pertinent to this consultation are: Glucose 592 Potassium 2.6  I have reviewed the images obtained: CT A/P-negative MRI brain-no clear acute ischemia on my review.  Impression: 39 year old male with a history of seizures in the setting of hyperglycemia who presents with recurrent seizures in the setting of  hyperglycemia and medication noncompliance.  His persistent deficit I think could be due to hyperglycemia/nonconvulsive seizure and he has been started on antiepileptic with Keppra.  I would favor stat EEG to rule out ongoing seizure.  Recommendations: 1) Stat EEG 2) continue keppra 1g BID 3) Mg 4) glucose control   Ritta Slot, MD [facility] [phone]  If 7pm- 7am, please page neurology on call as listed in AMION.

## 2019-09-05 ENCOUNTER — Inpatient Hospital Stay (HOSPITAL_COMMUNITY): Payer: Self-pay

## 2019-09-05 DIAGNOSIS — R569 Unspecified convulsions: Secondary | ICD-10-CM

## 2019-09-05 DIAGNOSIS — E876 Hypokalemia: Secondary | ICD-10-CM

## 2019-09-05 DIAGNOSIS — J9601 Acute respiratory failure with hypoxia: Secondary | ICD-10-CM

## 2019-09-05 DIAGNOSIS — R739 Hyperglycemia, unspecified: Secondary | ICD-10-CM

## 2019-09-05 LAB — BASIC METABOLIC PANEL
Anion gap: 10 (ref 5–15)
BUN: 17 mg/dL (ref 6–20)
CO2: 25 mmol/L (ref 22–32)
Calcium: 7.8 mg/dL — ABNORMAL LOW (ref 8.9–10.3)
Chloride: 100 mmol/L (ref 98–111)
Creatinine, Ser: 2.04 mg/dL — ABNORMAL HIGH (ref 0.61–1.24)
GFR calc Af Amer: 46 mL/min — ABNORMAL LOW (ref >60)
GFR calc non Af Amer: 39 mL/min — ABNORMAL LOW (ref >60)
Glucose, Bld: 458 mg/dL — ABNORMAL HIGH (ref 70–99)
Potassium: 3 mmol/L — ABNORMAL LOW (ref 3.5–5.1)
Sodium: 135 mmol/L (ref 135–145)

## 2019-09-05 LAB — GLUCOSE, CAPILLARY
Glucose-Capillary: 128 mg/dL — ABNORMAL HIGH (ref 70–99)
Glucose-Capillary: 142 mg/dL — ABNORMAL HIGH (ref 70–99)
Glucose-Capillary: 151 mg/dL — ABNORMAL HIGH (ref 70–99)
Glucose-Capillary: 178 mg/dL — ABNORMAL HIGH (ref 70–99)
Glucose-Capillary: 228 mg/dL — ABNORMAL HIGH (ref 70–99)
Glucose-Capillary: 231 mg/dL — ABNORMAL HIGH (ref 70–99)
Glucose-Capillary: 247 mg/dL — ABNORMAL HIGH (ref 70–99)

## 2019-09-05 LAB — POCT I-STAT EG7
Acid-Base Excess: 5 mmol/L — ABNORMAL HIGH (ref 0.0–2.0)
Bicarbonate: 29.1 mmol/L — ABNORMAL HIGH (ref 20.0–28.0)
Calcium, Ion: 1.2 mmol/L (ref 1.15–1.40)
HCT: 35 % — ABNORMAL LOW (ref 39.0–52.0)
Hemoglobin: 11.9 g/dL — ABNORMAL LOW (ref 13.0–17.0)
O2 Saturation: 98 %
Patient temperature: 37.4
Potassium: 2.9 mmol/L — ABNORMAL LOW (ref 3.5–5.1)
Sodium: 139 mmol/L (ref 135–145)
TCO2: 30 mmol/L (ref 22–32)
pCO2, Ven: 42.6 mmHg — ABNORMAL LOW (ref 44.0–60.0)
pH, Ven: 7.443 — ABNORMAL HIGH (ref 7.250–7.430)
pO2, Ven: 104 mmHg — ABNORMAL HIGH (ref 32.0–45.0)

## 2019-09-05 LAB — CBC
HCT: 36.8 % — ABNORMAL LOW (ref 39.0–52.0)
Hemoglobin: 11.5 g/dL — ABNORMAL LOW (ref 13.0–17.0)
MCH: 25.1 pg — ABNORMAL LOW (ref 26.0–34.0)
MCHC: 31.3 g/dL (ref 30.0–36.0)
MCV: 80.3 fL (ref 80.0–100.0)
Platelets: 213 10*3/uL (ref 150–400)
RBC: 4.58 MIL/uL (ref 4.22–5.81)
RDW: 15.8 % — ABNORMAL HIGH (ref 11.5–15.5)
WBC: 9.9 10*3/uL (ref 4.0–10.5)
nRBC: 0 % (ref 0.0–0.2)

## 2019-09-05 LAB — MAGNESIUM
Magnesium: 1.7 mg/dL (ref 1.7–2.4)
Magnesium: 2.4 mg/dL (ref 1.7–2.4)

## 2019-09-05 LAB — PHOSPHORUS
Phosphorus: 4.3 mg/dL (ref 2.5–4.6)
Phosphorus: 4.5 mg/dL (ref 2.5–4.6)

## 2019-09-05 LAB — TRIGLYCERIDES: Triglycerides: 197 mg/dL — ABNORMAL HIGH (ref <150)

## 2019-09-05 LAB — POTASSIUM: Potassium: 3.5 mmol/L (ref 3.5–5.1)

## 2019-09-05 MED ORDER — HYDROCHLOROTHIAZIDE 10 MG/ML ORAL SUSPENSION: 25.0000 mg | Freq: Every day | ORAL | Status: DC

## 2019-09-05 MED ORDER — DEXMEDETOMIDINE HCL IN NACL 400 MCG/100ML IV SOLN
0.0000 ug/kg/h | INTRAVENOUS | Status: DC
  Administered 2019-09-05: 1 ug/kg/h via INTRAVENOUS
  Administered 2019-09-05 (×2): 1.2 ug/kg/h via INTRAVENOUS
  Administered 2019-09-05: 0.4 ug/kg/h via INTRAVENOUS
  Administered 2019-09-06 (×2): 1.2 ug/kg/h via INTRAVENOUS
  Administered 2019-09-06: 1 ug/kg/h via INTRAVENOUS
  Administered 2019-09-06: 1.2 ug/kg/h via INTRAVENOUS
  Administered 2019-09-06: 1 ug/kg/h via INTRAVENOUS
  Filled 2019-09-05 (×10): qty 100

## 2019-09-05 MED ORDER — MAGNESIUM SULFATE 2 GM/50ML IV SOLN
2.0000 g | Freq: Once | INTRAVENOUS | Status: AC
  Administered 2019-09-05: 2 g via INTRAVENOUS
  Filled 2019-09-05: qty 50

## 2019-09-05 MED ORDER — POTASSIUM CHLORIDE 20 MEQ PO PACK: 40.0000 meq | PACK | Freq: Once | ORAL | Status: DC

## 2019-09-05 MED ORDER — POTASSIUM CHLORIDE 10 MEQ/100ML IV SOLN
10.0000 meq | Freq: Once | INTRAVENOUS | Status: AC
  Administered 2019-09-05: 10 meq via INTRAVENOUS

## 2019-09-05 MED ORDER — HYDRALAZINE HCL 50 MG PO TABS
100.0000 mg | ORAL_TABLET | Freq: Three times a day (TID) | ORAL | Status: DC
  Administered 2019-09-05 – 2019-09-09 (×14): 100 mg
  Filled 2019-09-05 (×15): qty 2

## 2019-09-05 MED ORDER — VITAL HIGH PROTEIN PO LIQD
1000.0000 mL | ORAL | Status: DC
  Administered 2019-09-05 – 2019-09-09 (×5): 1000 mL

## 2019-09-05 MED ORDER — POTASSIUM CHLORIDE 10 MEQ/100ML IV SOLN
10.0000 meq | INTRAVENOUS | Status: AC
  Administered 2019-09-05 (×2): 10 meq via INTRAVENOUS
  Filled 2019-09-05 (×3): qty 100

## 2019-09-05 MED ORDER — MIDAZOLAM HCL 2 MG/2ML IJ SOLN
2.0000 mg | INTRAMUSCULAR | Status: AC | PRN
  Administered 2019-09-06 – 2019-09-08 (×3): 2 mg via INTRAVENOUS
  Filled 2019-09-05 (×3): qty 2

## 2019-09-05 MED ORDER — POLYETHYLENE GLYCOL 3350 17 G PO PACK
17.0000 g | PACK | Freq: Every day | ORAL | Status: DC
  Administered 2019-09-06 – 2019-09-09 (×4): 17 g
  Filled 2019-09-05 (×4): qty 1

## 2019-09-05 MED ORDER — ACETAMINOPHEN 160 MG/5ML PO SOLN
650.0000 mg | Freq: Four times a day (QID) | ORAL | Status: DC | PRN
  Administered 2019-09-05 – 2019-09-06 (×3): 650 mg
  Filled 2019-09-05 (×2): qty 20.3

## 2019-09-05 MED ORDER — POTASSIUM CHLORIDE 20 MEQ PO PACK
30.0000 meq | PACK | Freq: Once | ORAL | Status: AC
  Administered 2019-09-05: 30 meq
  Filled 2019-09-05: qty 2

## 2019-09-05 MED ORDER — AMLODIPINE BESYLATE 2.5 MG PO TABS
10.0000 mg | ORAL_TABLET | Freq: Every day | ORAL | Status: DC
  Administered 2019-09-05 – 2019-09-09 (×5): 10 mg
  Filled 2019-09-05 (×6): qty 4

## 2019-09-05 MED ORDER — FENTANYL CITRATE (PF) 100 MCG/2ML IJ SOLN
50.0000 ug | INTRAMUSCULAR | Status: DC | PRN
  Administered 2019-09-05: 100 ug via INTRAVENOUS
  Administered 2019-09-05: 50 ug via INTRAVENOUS
  Administered 2019-09-05 – 2019-09-08 (×10): 100 ug via INTRAVENOUS
  Administered 2019-09-08: 200 ug via INTRAVENOUS
  Administered 2019-09-08: 100 ug via INTRAVENOUS
  Administered 2019-09-08: 50 ug via INTRAVENOUS
  Administered 2019-09-09 (×2): 100 ug via INTRAVENOUS
  Filled 2019-09-05: qty 2
  Filled 2019-09-05: qty 4
  Filled 2019-09-05 (×13): qty 2

## 2019-09-05 MED ORDER — FENTANYL CITRATE (PF) 100 MCG/2ML IJ SOLN
50.0000 ug | INTRAMUSCULAR | Status: DC | PRN
  Administered 2019-09-06 – 2019-09-09 (×2): 50 ug via INTRAVENOUS
  Filled 2019-09-05 (×3): qty 2

## 2019-09-05 MED ORDER — POTASSIUM CHLORIDE 20 MEQ PO PACK: 20.0000 meq | PACK | Freq: Once | ORAL | Status: DC

## 2019-09-05 MED ORDER — PRO-STAT SUGAR FREE PO LIQD
30.0000 mL | Freq: Two times a day (BID) | ORAL | Status: DC
  Administered 2019-09-05 – 2019-09-06 (×3): 30 mL
  Filled 2019-09-05 (×3): qty 30

## 2019-09-05 MED ORDER — ORAL CARE MOUTH RINSE
15.0000 mL | OROMUCOSAL | Status: DC
  Administered 2019-09-05 – 2019-09-10 (×52): 15 mL via OROMUCOSAL

## 2019-09-05 MED ORDER — HYDROCHLOROTHIAZIDE 10 MG/ML ORAL SUSPENSION
25.0000 mg | Freq: Every day | ORAL | Status: DC
  Administered 2019-09-05 – 2019-09-09 (×5): 25 mg
  Filled 2019-09-05 (×6): qty 2.5

## 2019-09-05 MED ORDER — CHLORHEXIDINE GLUCONATE 0.12% ORAL RINSE (MEDLINE KIT)
15.0000 mL | Freq: Two times a day (BID) | OROMUCOSAL | Status: DC
  Administered 2019-09-05 – 2019-09-10 (×11): 15 mL via OROMUCOSAL

## 2019-09-05 MED ORDER — SODIUM CHLORIDE 0.9 % IV SOLN
2.0000 g | INTRAVENOUS | Status: DC
  Administered 2019-09-05: 2 g via INTRAVENOUS
  Filled 2019-09-05 (×2): qty 20

## 2019-09-05 MED ORDER — MIDAZOLAM HCL 2 MG/2ML IJ SOLN
2.0000 mg | INTRAMUSCULAR | Status: DC | PRN
  Administered 2019-09-05: 2 mg via INTRAVENOUS
  Filled 2019-09-05: qty 2

## 2019-09-05 MED ORDER — METOPROLOL TARTRATE 25 MG/10 ML ORAL SUSPENSION
50.0000 mg | Freq: Two times a day (BID) | ORAL | Status: DC
  Administered 2019-09-05 – 2019-09-09 (×10): 50 mg
  Filled 2019-09-05 (×11): qty 20

## 2019-09-05 MED ORDER — DOCUSATE SODIUM 50 MG/5ML PO LIQD
100.0000 mg | Freq: Two times a day (BID) | ORAL | Status: DC
  Administered 2019-09-06 – 2019-09-09 (×8): 100 mg
  Filled 2019-09-05 (×8): qty 10

## 2019-09-05 MED ORDER — HYDROCHLOROTHIAZIDE 25 MG PO TABS: 25.0000 mg | ORAL_TABLET | Freq: Every day | ORAL | Status: DC

## 2019-09-05 NOTE — Progress Notes (Signed)
eLink Physician-Brief Progress Note Patient Name: Micheal Levy DOB: 1981/01/16 MRN: [id number]   Date of Service  09/05/2019  HPI/Events of Note  Fever to 103.6 F - Respiratory culture obtained earlier today. Tylenol already ordered. Ice packs applied. Patient is already on Rocephin.   eICU Interventions  Will order: 1. Blood cultures X 2 now.  2. Trend Lactic Acid.  3. Cooling blanket PRN.     Intervention Category Major Interventions: Infection - evaluation and management;Sepsis - evaluation and management  Lenell Antu 09/05/2019, 10:23 PM

## 2019-09-05 NOTE — Progress Notes (Signed)
eLink Physician-Brief Progress Note Patient Name: HUNT ZAJICEK DOB: [birth year] MRN: [id number]   Date of Service  09/05/2019  HPI/Events of Note  Low K/mag. Cr > 2  eICU Interventions  Kcl 10 meq Q1 x 3 dose and 2 gm Mag IVPB. Follow K at 10 AM.     Intervention Category Intermediate Interventions: Electrolyte abnormality - evaluation and management  Ranee Gosselin 09/05/2019, 5:40 AM

## 2019-09-05 NOTE — Progress Notes (Signed)
LTM maint complete - no skin breakdown under: Fp1 F3 F7 A1 01

## 2019-09-05 NOTE — Care Plan (Signed)
Stat LTM EEG reviewed, shows continuous delta slowing admixed with beta activity consistent with sedation. NO seizures noted. Dr Aroor notified.  Please review final report for details.  Micheal Levy Micheal Levy

## 2019-09-05 NOTE — Progress Notes (Signed)
[facility] P[facility]M  Failed SBT: tachypnea to 40, fever 39, O2 saturation 84% Restart sedation, ventilator Ceftriaxone Start tube feeding  Micheal [place] Pr[place], MD [facility] Pager: [phone] Cell: [phone] If no response, call [phone]

## 2019-09-05 NOTE — Procedures (Addendum)
Patient Name: Micheal Levy  MRN: [id number]  Epilepsy Attending: Charlsie Quest  Referring Physician/Provider: Dr. Onalee Hua Duration: 09/04/2019 3748 to 09/05/2019 2707  Patient history: 38 year old male with history of seizures in the setting of hyperglycemia presented with recurrent seizures in the setting of hyperglycemia and medication noncompliance.  EEG to evaluate for seizures.  Level of alertness: Comatose  AEDs during EEG study: Keppra, propofol  Technical aspects: This EEG study was done with scalp electrodes positioned according to the 10-20 International system of electrode placement. Electrical activity was acquired at a sampling rate of 500Hz  and reviewed with a high frequency filter of 70Hz  and a low frequency filter of 1Hz . EEG data were recorded continuously and digitally stored.   Description:  EEG showed continuous generalized 3-6 Hz theta-delta slowing admixed with15 to 18 Hz beta activity with irregular morphology distributed symmetrically and diffusely. Hyperventilation and photic stimulation were not performed.     ABNORMALITY -Excessive beta, generalized -Continuous slow, generalized  IMPRESSION: This study is suggestive of severe diffuse encephalopathy, nonspecific etiology but likely related to sedation. No seizures or epileptiform discharges were seen throughout the recording.  [name] 

## 2019-09-05 NOTE — Progress Notes (Signed)
Subjective: No seizures on EEG overnight.    ROS: unable to obtain due to poor mental status  Examination  Vital signs in last 24 hours: Temp:  [98.8 F (37.1 C)-100.4 F (38 C)] 100.4 F (38 C) (05/31 1105) Pulse Rate:  [73-87] 87 (05/31 1105) Resp:  [14-20] 14 (05/31 1105) BP: (120-189)/(85-129) 142/109 (05/31 1105) SpO2:  [100 %] 100 % (05/31 1107) FiO2 (%):  [40 %-60 %] 40 % (05/31 1107)  General: lying in bed, not in apparent distress CVS: pulse-normal rate and rhythm RS: breathing comfortably, intubated Extremities: normal, warm Neuro: Opens eyes to tactile stimulation, does not follow commands, pupils equally round and reactive, looking around in room, blinks to threat bilaterally, moving all 4 extremities spontaneously with 2+ reflexes  Basic Metabolic Panel: Recent Labs  Lab 09/04/19 0829 09/04/19 0833 09/04/19 1219 09/04/19 1219 09/04/19 1622 09/04/19 1622 09/04/19 2000 09/04/19 2104 09/05/19 0122 09/05/19 0305  NA  --    < > 139   < > 140  --  137 139 135 139  K  --    < > 3.0*   < > 3.1*  --  4.3 3.3* 3.0* 2.9*  CL  --    < > 99  --  101  --  98 101 100  --   CO2  --    < > 29  --  29  --  24 28 25   --   GLUCOSE  --    < > 334*  --  165*  --  121* 111* 458*  --   BUN  --    < > 18  --  17  --  20 18 17   --   CREATININE  --    < > 2.14*  --  2.02*  --  2.10* 2.06* 2.04*  --   CALCIUM  --    < > 9.0   < > 8.6*   < > 8.1* 8.4* 7.8*  --   MG 2.0  --   --   --   --   --   --   --  1.7  --   PHOS  --   --   --   --   --   --   --   --  4.3  --    < > = values in this interval not displayed.    CBC: Recent Labs  Lab 09/04/19 0506 09/04/19 0506 09/04/19 0512 09/04/19 0612 09/04/19 0833 09/05/19 0122 09/05/19 0305  WBC 13.0*  --   --   --   --  9.9  --   NEUTROABS 7.5  --   --   --   --   --   --   HGB 14.1   < > 15.6 16.3 13.9 11.5* 11.9*  HCT 44.3   < > 46.0 48.0 41.0 36.8* 35.0*  MCV 78.5*  --   --   --   --  80.3  --   PLT 322  --   --   --   --   213  --    < > = values in this interval not displayed.     Coagulation Studies: Recent Labs    09/04/19 0506  LABPROT 13.0  INR 1.0    Imaging MRI brain without contrast 09/04/2019: No acute abnormality.  ASSESSMENT AND PLAN: 39 year old male with prior history of cocaine use, history of seizures in the setting of hyperglycemia presented with  recurrence of seizures again in the setting of hyperglycemia (on arrival blood glucose 592) and medication noncompliance.  Was given Keppra and started on propofol.  No further seizures on LTM EEG overnight.  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  No further seizures overnight.  Recommendations -Okay to wean off/stop propofol as patient has been seizure-free -Continue LTM EEG after propofol is weaned off to be sure patient does not have seizure recurrence.  We will likely discontinue LTM later today or tomorrow -Continue Keppra 1000 mg twice daily -Seizure precautions -As needed IV Ativan 2 mg for generalized tonic-clonic seizure lasting more than 2 minutes, focal seizure lasting more than 5 minutes -Management of rest of comorbidities per primary team   CRITICAL CARE Performed by: Lora Havens   Total critical care time: 35 minutes  Critical care time was exclusive of separately billable procedures and treating other patients.  Critical care was necessary to treat or prevent imminent or life-threatening deterioration.  Critical care was time spent personally by me on the following activities: development of treatment plan with patient and/or surrogate as well as nursing, discussions with consultants, evaluation of patient's response to treatment, examination of patient,  obtaining history from patient or surrogate, ordering and performing treatments and interventions, ordering and review of laboratory studies, ordering and review of radiographic studies, pulse oximetry and re-evaluation of patient's condition.   Zeb Comfort Epilepsy [facility] For questions after 5pm please refer to AMION to reach the Neurologist on call

## 2019-09-05 NOTE — Progress Notes (Signed)
NAME:  Micheal Levy, MRN:  [id number], DOB:  19-Feb-1981, LOS: 1 ADMISSION DATE:  09/04/2019, CONSULTATION DATE:  09/04/2019 REFERRING MD: Rancour , CHIEF COMPLAINT:  Seizures/ Hyperglycemia  Brief History   Micheal MCNERNEY is a 39 y.o. male with a history of substance abuse, DM, HTN , seizures in the setting of hyperglycemia, and previous stroke.who presented to the ED 5/30 early am with seizures, hyperglycemia and HTN. CT and MRI Head were negative for stroke. CTA was negative for PE.  He was intubated for airway protection. PCCM were asked to admit and manage care.   Past Medical History   Past Medical History:  Diagnosis Date  . Chronic kidney disease   . Diabetes mellitus without complication ([facility])   . Elevated serum cholesterol 04/2019  . Elevated serum creatinine 04/2019  . History of seizures   . Hyperglycemia   . Hypertension   . Hypertensive urgency 04/2019  . Hypokalemia   . Seizures ([facility]) 03/2017   x 2   . Stroke ([facility]) 03/2017  . Substance abuse ([facility])   . Vitamin D deficiency 04/2019    Significant Hospital Events   5/30: Admission  Consults:  5/30 Neuro >> Code Stroke RO 5/30 PCCM  Procedures:    Significant Diagnostic Tests:  5/30 CTA Head / Cerebral PerfusionNo emergent large vessel occlusion or high-grade stenosis.  Areas of calculated ischemia are likely artifactual.  09/04/2019 MRI Head No evidence of recent infarction, hemorrhage, or mass. Mild cerebral volume loss and chronic microvascular ischemic changes  09/04/2019 CTA Chest No pulmonary embolism. Dilated main pulmonary artery, suggesting pulmonary arterial hypertension. Mild cardiomegaly. Mosaic attenuation throughout both lungs, nonspecific, probably due to mosaic perfusion from pulmonary vascular disease. Right pulmonary nodules, largest 3 mm. No follow-up needed if patient is low-risk   Micro Data:  5/30: SARS COVID 2 >> Negative  Antimicrobials:  None   Interim history/subjective:   No  seizures Hypertension better controlled Hyperglycemia better controlled  Objective   Blood pressure (!) 142/109, pulse 87, temperature (!) 100.4 F (38 C), resp. rate 14, height 6\' 1"  (1.854 m), weight 107.7 kg, SpO2 100 %.    Vent Mode: PSV;CPAP FiO2 (%):  [40 %-60 %] 40 % Set Rate:  [14 bmp] 14 bmp Vt Set:  [630 mL] 630 mL PEEP:  [5 cmH20] 5 cmH20 Pressure Support:  [15 cmH20] 15 cmH20 Plateau Pressure:  [22 cmH20-25 cmH20] 25 cmH20   Intake/Output Summary (Last 24 hours) at 09/05/2019 1119 Last data filed at 09/05/2019 1100 Gross per 24 hour  Intake 3322.45 ml  Output 1425 ml  Net 1897.45 ml   Filed Weights   09/04/19 0453  Weight: 107.7 kg    Examination:  General:  In bed on vent HENT: NCAT ETT in place PULM: CTA B, vent supported breathing CV: RRR, no mgr GI: BS+, soft, nontender MSK: normal bulk and tone Neuro: sedated on vent    Resolved Hospital Problem list     Assessment & Plan:  Seizure in setting of hyperglycemia and hypertension CTA/ MRI Brain Negative for stroke Plan Keppra to continue for now F/u neurology recommendations Seizure precautions  Acute Respiratory Failure in setting of seizure Intubation for airway protection Hypercarbic on initial ABG Plan Wean to pressure support now VAP prevention Consider extubation if awake today WUA now  Hyperglycemia / History of DM Plan continue levemir and SSI  Hypokalemia CKD, GFR unspecified Plan Continue NS for now Monitor BMET and UOP Replace electrolytes as needed  HTN Plan Remain off cardene Restart home hctz/metoprolol/hydralazine/amlodipine   Best practice:  Diet: NPO Pain/Anxiety/Delirium protocol (if indicated): d/c now VAP protocol (if indicated): Initiated DVT prophylaxis: PAS hose GI prophylaxis: Protonix Glucose control: as above Mobility: BR Code Status: Full Family Communication: wife updated bedside on 5/31 Disposition: ICU  Labs   CBC: Recent Labs  Lab  09/04/19 0506 09/04/19 0506 09/04/19 0512 09/04/19 0612 09/04/19 0833 09/05/19 0122 09/05/19 0305  WBC 13.0*  --   --   --   --  9.9  --   NEUTROABS 7.5  --   --   --   --   --   --   HGB 14.1   < > 15.6 16.3 13.9 11.5* 11.9*  HCT 44.3   < > 46.0 48.0 41.0 36.8* 35.0*  MCV 78.5*  --   --   --   --  80.3  --   PLT 322  --   --   --   --  213  --    < > = values in this interval not displayed.    Basic Metabolic Panel: Recent Labs  Lab 09/04/19 0829 09/04/19 0833 09/04/19 1219 09/04/19 1219 09/04/19 1622 09/04/19 2000 09/04/19 2104 09/05/19 0122 09/05/19 0305  NA  --    < > 139   < > 140 137 139 135 139  K  --    < > 3.0*   < > 3.1* 4.3 3.3* 3.0* 2.9*  CL  --    < > 99  --  101 98 101 100  --   CO2  --    < > 29  --  29 24 28 25   --   GLUCOSE  --    < > 334*  --  165* 121* 111* 458*  --   BUN  --    < > 18  --  17 20 18 17   --   CREATININE  --    < > 2.14*  --  2.02* 2.10* 2.06* 2.04*  --   CALCIUM  --    < > 9.0  --  8.6* 8.1* 8.4* 7.8*  --   MG 2.0  --   --   --   --   --   --  1.7  --   PHOS  --   --   --   --   --   --   --  4.3  --    < > = values in this interval not displayed.   GFR: Estimated Creatinine Clearance: 61.3 mL/min (A) (by C-G formula based on SCr of 2.04 mg/dL (H)). Recent Labs  Lab 09/04/19 0506 09/05/19 0122  WBC 13.0* 9.9    Liver Function Tests: Recent Labs  Lab 09/04/19 0506  AST 30  ALT 21  ALKPHOS 109  BILITOT 0.9  PROT 7.3  ALBUMIN 3.7   No results for input(s): LIPASE, AMYLASE in the last 168 hours. No results for input(s): AMMONIA in the last 168 hours.  ABG    Component Value Date/Time   PHART 7.389 09/04/2019 0833   PCO2ART 53.3 (H) 09/04/2019 0833   PO2ART 93 09/04/2019 0833   HCO3 29.1 (H) 09/05/2019 0305   TCO2 30 09/05/2019 0305   ACIDBASEDEF 0.8 03/24/2017 0330   O2SAT 98.0 09/05/2019 0305     Coagulation Profile: Recent Labs  Lab 09/04/19 0506  INR 1.0    Cardiac Enzymes: No results for input(s):  CKTOTAL, CKMB,  CKMBINDEX, TROPONINI in the last 168 hours.  HbA1C: Hemoglobin A1C  Date/Time Value Ref Range Status  04/19/2019 10:38 AM 7.9 (A) 4.0 - 5.6 % Final  01/04/2018 11:43 AM 8.7 (A) 4.0 - 5.6 % Final   Hgb A1c MFr Bld  Date/Time Value Ref Range Status  09/04/2019 06:22 PM 11.4 (H) 4.8 - 5.6 % Final    Comment:    (NOTE) Pre diabetes:          5.7%-6.4% Diabetes:              >6.4% Glycemic control for   <7.0% adults with diabetes   06/04/2017 11:11 AM 7.0 (H) 4.8 - 5.6 % Final    Comment:             Prediabetes: 5.7 - 6.4          Diabetes: >6.4          Glycemic control for adults with diabetes: <7.0     CBG: Recent Labs  Lab 09/04/19 1727 09/04/19 2016 09/04/19 2346 09/05/19 0350 09/05/19 0759  GLUCAP 146* 123* 154* 178* 128*     Critical care time: 35 minutes    Roselie Awkward, MD [place] [facility] Pager: [phone] Cell: [phone] If no response, call [phone]  09/05/2019 11:19 AM

## 2019-09-06 ENCOUNTER — Inpatient Hospital Stay (HOSPITAL_COMMUNITY): Payer: Self-pay

## 2019-09-06 LAB — HEPATITIS A ANTIBODY, IGM: Hep A IgM: NONREACTIVE

## 2019-09-06 LAB — MAGNESIUM
Magnesium: 2.4 mg/dL (ref 1.7–2.4)
Magnesium: 2.2 mg/dL (ref 1.7–2.4)

## 2019-09-06 LAB — COMPREHENSIVE METABOLIC PANEL
ALT: 326 U/L — ABNORMAL HIGH (ref 0–44)
AST: 831 U/L — ABNORMAL HIGH (ref 15–41)
Albumin: 2.7 g/dL — ABNORMAL LOW (ref 3.5–5.0)
Alkaline Phosphatase: 63 U/L (ref 38–126)
Anion gap: 12 (ref 5–15)
BUN: 26 mg/dL — ABNORMAL HIGH (ref 6–20)
CO2: 22 mmol/L (ref 22–32)
Calcium: 8.7 mg/dL — ABNORMAL LOW (ref 8.9–10.3)
Chloride: 103 mmol/L (ref 98–111)
Creatinine, Ser: 2.75 mg/dL — ABNORMAL HIGH (ref 0.61–1.24)
GFR calc Af Amer: 32 mL/min — ABNORMAL LOW (ref >60)
GFR calc non Af Amer: 27 mL/min — ABNORMAL LOW (ref >60)
Glucose, Bld: 287 mg/dL — ABNORMAL HIGH (ref 70–99)
Potassium: 4 mmol/L (ref 3.5–5.1)
Sodium: 137 mmol/L (ref 135–145)
Total Bilirubin: 1.2 mg/dL (ref 0.3–1.2)
Total Protein: 6.4 g/dL — ABNORMAL LOW (ref 6.5–8.1)

## 2019-09-06 LAB — GLUCOSE, CAPILLARY
Glucose-Capillary: 242 mg/dL — ABNORMAL HIGH (ref 70–99)
Glucose-Capillary: 248 mg/dL — ABNORMAL HIGH (ref 70–99)
Glucose-Capillary: 258 mg/dL — ABNORMAL HIGH (ref 70–99)
Glucose-Capillary: 267 mg/dL — ABNORMAL HIGH (ref 70–99)
Glucose-Capillary: 302 mg/dL — ABNORMAL HIGH (ref 70–99)
Glucose-Capillary: 302 mg/dL — ABNORMAL HIGH (ref 70–99)
Glucose-Capillary: 597 mg/dL (ref 70–99)

## 2019-09-06 LAB — HEPATITIS B SURFACE ANTIGEN: Hepatitis B Surface Ag: NONREACTIVE

## 2019-09-06 LAB — HEPATITIS B SURFACE ANTIBODY,QUALITATIVE: Hep B S Ab: REACTIVE — AB

## 2019-09-06 LAB — PHOSPHORUS
Phosphorus: 5 mg/dL — ABNORMAL HIGH (ref 2.5–4.6)
Phosphorus: 3.6 mg/dL (ref 2.5–4.6)

## 2019-09-06 LAB — LACTIC ACID, PLASMA
Lactic Acid, Venous: 2.4 mmol/L (ref 0.5–1.9)
Lactic Acid, Venous: 2.4 mmol/L (ref 0.5–1.9)

## 2019-09-06 LAB — HEPATITIS B CORE ANTIBODY, IGM: Hep B C IgM: NONREACTIVE

## 2019-09-06 LAB — HEPATITIS A ANTIBODY, TOTAL: hep A Total Ab: NONREACTIVE

## 2019-09-06 MED ORDER — DEXMEDETOMIDINE HCL IN NACL 400 MCG/100ML IV SOLN
0.4000 ug/kg/h | INTRAVENOUS | Status: DC
  Administered 2019-09-06 – 2019-09-07 (×3): 1.4 ug/kg/h via INTRAVENOUS
  Administered 2019-09-07: 1.2 ug/kg/h via INTRAVENOUS
  Administered 2019-09-07: 1.4 ug/kg/h via INTRAVENOUS
  Administered 2019-09-07: 1.6 ug/kg/h via INTRAVENOUS
  Administered 2019-09-07: 1.4 ug/kg/h via INTRAVENOUS
  Administered 2019-09-07: 1.2 ug/kg/h via INTRAVENOUS
  Administered 2019-09-08: 0.743 ug/kg/h via INTRAVENOUS
  Administered 2019-09-08: 1.2 ug/kg/h via INTRAVENOUS
  Administered 2019-09-08: 1.8 ug/kg/h via INTRAVENOUS
  Administered 2019-09-08: 1.2 ug/kg/h via INTRAVENOUS
  Administered 2019-09-08 (×2): 1.6 ug/kg/h via INTRAVENOUS
  Administered 2019-09-08: 0.8 ug/kg/h via INTRAVENOUS
  Administered 2019-09-08: 0.743 ug/kg/h via INTRAVENOUS
  Administered 2019-09-09: 1.4 ug/kg/h via INTRAVENOUS
  Administered 2019-09-09: 1.6 ug/kg/h via INTRAVENOUS
  Administered 2019-09-09: 1.4 ug/kg/h via INTRAVENOUS
  Administered 2019-09-09 (×2): 1.6 ug/kg/h via INTRAVENOUS
  Administered 2019-09-09: 1.2 ug/kg/h via INTRAVENOUS
  Administered 2019-09-09: 1.6 ug/kg/h via INTRAVENOUS
  Administered 2019-09-09: 0.8 ug/kg/h via INTRAVENOUS
  Administered 2019-09-09: 0.743 ug/kg/h via INTRAVENOUS
  Administered 2019-09-09: 1.4 ug/kg/h via INTRAVENOUS
  Filled 2019-09-06 (×6): qty 100
  Filled 2019-09-06: qty 200
  Filled 2019-09-06 (×3): qty 100
  Filled 2019-09-06 (×2): qty 200
  Filled 2019-09-06 (×5): qty 100
  Filled 2019-09-06: qty 200
  Filled 2019-09-06: qty 100
  Filled 2019-09-06: qty 200
  Filled 2019-09-06: qty 100

## 2019-09-06 MED ORDER — VANCOMYCIN HCL 10 G IV SOLR
2250.0000 mg | Freq: Once | INTRAVENOUS | Status: AC
  Administered 2019-09-06: 2250 mg via INTRAVENOUS
  Filled 2019-09-06: qty 2250

## 2019-09-06 MED ORDER — PRO-STAT SUGAR FREE PO LIQD
60.0000 mL | Freq: Three times a day (TID) | ORAL | Status: DC
  Administered 2019-09-06 – 2019-09-09 (×11): 60 mL
  Filled 2019-09-06 (×11): qty 60

## 2019-09-06 MED ORDER — INSULIN DETEMIR 100 UNIT/ML ~~LOC~~ SOLN
18.0000 [IU] | Freq: Two times a day (BID) | SUBCUTANEOUS | Status: DC
  Administered 2019-09-06 (×2): 18 [IU] via SUBCUTANEOUS
  Filled 2019-09-06 (×4): qty 0.18

## 2019-09-06 MED ORDER — VANCOMYCIN HCL 1250 MG/250ML IV SOLN
1250.0000 mg | INTRAVENOUS | Status: DC
  Filled 2019-09-06: qty 250

## 2019-09-06 MED ORDER — SODIUM CHLORIDE 0.9 % IV SOLN
750.0000 mg | Freq: Two times a day (BID) | INTRAVENOUS | Status: DC
  Administered 2019-09-06 – 2019-09-08 (×4): 750 mg via INTRAVENOUS
  Filled 2019-09-06 (×5): qty 7.5

## 2019-09-06 MED ORDER — SODIUM CHLORIDE 0.9 % IV BOLUS
500.0000 mL | Freq: Once | INTRAVENOUS | Status: AC
  Administered 2019-09-06: 500 mL via INTRAVENOUS

## 2019-09-06 MED ORDER — ADULT MULTIVITAMIN W/MINERALS CH
1.0000 | ORAL_TABLET | Freq: Every day | ORAL | Status: DC
  Administered 2019-09-06 – 2019-09-09 (×4): 1
  Filled 2019-09-06 (×4): qty 1

## 2019-09-06 NOTE — Progress Notes (Signed)
LTM EEG complete. No skin breakdown °

## 2019-09-06 NOTE — Progress Notes (Signed)
Pharmacy Antibiotic Note  Micheal Levy is a 39 y.o. male admitted on 09/04/2019 with recurrent seizures in the setting of hyperglycemia and medication noncompliance. He has been spiking fevers to 103.8 and now found to have staph aureus growing in trach culture. Pharmacy has been consulted for vancomycin dosing and to discontinue ceftriaxone.  Plan: Vancomycin 2,250 mg IV x 1, followed by 1,250 mg IV q24h Goal trough 15-20  Stop ceftriaxone Monitor renal function, cultures/susceptibilities, vancomycin levels as indicated, and LOT   Height: 6\' 1"  (185.4 cm) Weight: 107.7 kg (237 lb 7 oz) IBW/kg (Calculated) : 79.9  Temp (24hrs), Avg:101.7 F (38.7 C), Min:98.1 F (36.7 C), Max:103.8 F (39.9 C)  Recent Labs  Lab 09/04/19 0506 09/04/19 0512 09/04/19 1622 09/04/19 2000 09/04/19 2104 09/05/19 0122 09/06/19 0148 09/06/19 0525  WBC 13.0*  --   --   --   --  9.9  --   --   CREATININE 1.85*   < > 2.02* 2.10* 2.06* 2.04*  --  2.75*  LATICACIDVEN  --   --   --   --   --   --  2.4* 2.4*   < > = values in this interval not displayed.    Estimated Creatinine Clearance: 45.5 mL/min (A) (by C-G formula based on SCr of 2.75 mg/dL (H)).    No Known Allergies  Antimicrobials this admission: Ceftriaxone 5/31 >> 6/1 Vancomycin 6/1 >>   Dose adjustments this admission: None  Microbiology results: 6/1 BCx: sent 5/31 TA: few staph aureus - pending susceptibilities 5/30 MRSA PCR: neg  Thank you for allowing pharmacy to be a part of this patient's care.  6/30, PharmD, [facility] PGY2 Cardiology Pharmacy Resident Phone [phone] 09/06/2019       11:07 AM

## 2019-09-06 NOTE — Progress Notes (Signed)
eLink Physician-Brief Progress Note Patient Name: Micheal Levy DOB: 07-26-1980 MRN: [id number]   Date of Service  09/06/2019  HPI/Events of Note  Lactic acid = 2.4. LVEF = 50 to 55%.   eICU Interventions  Will bolus with 0.9 NaCl 500 mL IV Q 6 hours.      Intervention Category Major Interventions: Acid-Base disturbance - evaluation and management  [name] 09/06/2019, 3:16 AM

## 2019-09-06 NOTE — Progress Notes (Addendum)
Subjective: No further seizure-like activity overnight.  Was febrile.   ROS: Unable to obtain due to poor mental status  Examination  Vital signs in last 24 hours: Temp:  [98.1 F (36.7 C)-103.8 F (39.9 C)] 99.9 F (37.7 C) (06/01 1200) Pulse Rate:  [62-97] 69 (06/01 1200) Resp:  [15-42] 42 (06/01 1100) BP: (106-160)/(77-124) 143/108 (06/01 1200) SpO2:  [83 %-100 %] 96 % (06/01 1200) FiO2 (%):  [40 %] 40 % (06/01 1117)  General: lying in bed, not in apparent distress CVS: pulse-normal rate and rhythm RS: breathing comfortably, intubated Extremities: normal, warm  Neuro: MS: Opens eyes to verbal stimulation, followed simple one-step commands like moving his arms and legs CN: pupils equal and reactive, looking around in room, no nystagmus, blinks to threat bilaterally  Motor: Spontaneously moving all 4 extremities with antigravity strength  Reflexes: 2+ bilaterally over patella, biceps  Basic Metabolic Panel: Recent Labs  Lab 09/04/19 0829 09/04/19 0833 09/04/19 1622 09/04/19 1622 09/04/19 2000 09/04/19 2000 09/04/19 2104 09/05/19 0122 09/05/19 0305 09/05/19 1119 09/05/19 1657 09/06/19 0525  NA  --    < > 140   < > 137  --  139 135 139  --   --  137  K  --    < > 3.1*   < > 4.3   < > 3.3* 3.0* 2.9* 3.5  --  4.0  CL  --    < > 101  --  98  --  101 100  --   --   --  103  CO2  --    < > 29  --  24  --  28 25  --   --   --  22  GLUCOSE  --    < > 165*  --  121*  --  111* 458*  --   --   --  287*  BUN  --    < > 17  --  20  --  18 17  --   --   --  26*  CREATININE  --    < > 2.02*  --  2.10*  --  2.06* 2.04*  --   --   --  2.75*  CALCIUM  --    < > 8.6*   < > 8.1*   < > 8.4* 7.8*  --   --   --  8.7*  MG 2.0  --   --   --   --   --   --  1.7  --   --  2.4 2.4  PHOS  --   --   --   --   --   --   --  4.3  --   --  4.5 5.0*   < > = values in this interval not displayed.    CBC: Recent Labs  Lab 09/04/19 0506 09/04/19 0506 09/04/19 0512 09/04/19 0612  09/04/19 0833 09/05/19 0122 09/05/19 0305  WBC 13.0*  --   --   --   --  9.9  --   NEUTROABS 7.5  --   --   --   --   --   --   HGB 14.1   < > 15.6 16.3 13.9 11.5* 11.9*  HCT 44.3   < > 46.0 48.0 41.0 36.8* 35.0*  MCV 78.5*  --   --   --   --  80.3  --   PLT 322  --   --   --   --  213  --    < > = values in this interval not displayed.     Coagulation Studies: Recent Labs    09/04/19 0506  LABPROT 13.0  INR 1.0    Imaging No new brain imaging overnight.  ASSESSMENT AND PLAN: 39 year old male with prior history of cocaine use, history of seizures in the setting of hyperglycemia presented with recurrence of seizures again in the setting of hyperglycemia (on arrival blood glucose 592) and medication noncompliance.  Was given Keppra and started on propofol.  No further seizures on LTM EEG, propofol was weaned off.  Status epilepticus (resolved) Acute respiratory failure in setting of frequent seizures Fever Transaminitis -No further seizures overnight however spiked a temperature.  ICU team treating for staph aureus pneumonia.  Recommendations -Discontinue LTM EEG as patient has been seizure-free -Continue Keppra 1000 mg twice daily. -We will discuss with patient's wife if patient was on Keppra prior to admission, attempted to call at 1300 on 09/06/2019 but went to voicemail. - At this point, patient will most likely be discharged on Keppra.  However if this was a provoked seizure in the setting of hyperglycemia, it is possible that patient can be weaned off of Keppra in near future.  -Follow-up with neurology in 8 to 12 weeks after discharge -Seizure precautions including do not drive -As needed IV Ativan 2 mg for generalized tonic-clonic seizure lasting more than 2 minutes, focal seizure lasting more than 5 minutes -Management of rest of comorbidities per primary team   Seizure precautions: Per [facility] statutes, patients with seizures are not allowed to drive  until they have been seizure-free for six months and cleared by a physician    Use caution when using heavy equipment or power tools. Avoid working on ladders or at heights. Take showers instead of baths. Ensure the water temperature is not too high on the home water heater. Do not go swimming alone. Do not lock yourself in a room alone (i.e. bathroom). When caring for infants or small children, sit down when holding, feeding, or changing them to minimize risk of injury to the child in the event you have a seizure. Maintain good sleep hygiene. Avoid alcohol.    If patient has another seizure, call 911 and bring them back to the ED if: A.  The seizure lasts longer than 5 minutes.      B.  The patient doesn't wake shortly after the seizure or has new problems such as difficulty seeing, speaking or moving following the seizure C.  The patient was injured during the seizure D.  The patient has a temperature over 102 F (39C) E.  The patient vomited during the seizure and now is having trouble breathing    During the Seizure   - First, ensure adequate ventilation and place patients on the floor on their left side  Loosen clothing around the neck and ensure the airway is patent. If the patient is clenching the teeth, do not force the mouth open with any object as this can cause severe damage - Remove all items from the surrounding that can be hazardous. The patient may be oblivious to what's happening and may not even know what he or she is doing. If the patient is confused and wandering, either gently guide him/her away and block access to outside areas - Reassure the individual and be comforting - Call 911. In most cases, the seizure ends before EMS arrives. However, there are cases when seizures may last over  3 to 5 minutes. Or the individual may have developed breathing difficulties or severe injuries. If a pregnant patient or a person with diabetes develops a seizure, it is prudent to call an  ambulance. - Finally, if the patient does not regain full consciousness, then call EMS. Most patients will remain confused for about 45 to 90 minutes after a seizure, so you must use judgment in calling for help. - Avoid restraints but make sure the patient is in a bed with padded side rails - Place the individual in a lateral position with the neck slightly flexed; this will help the saliva drain from the mouth and prevent the tongue from falling backward - Remove all nearby furniture and other hazards from the area - Provide verbal assurance as the individual is regaining consciousness - Provide the patient with privacy if possible - Call for help and start treatment as ordered by the caregiver    After the Seizure (Postictal Stage)   After a seizure, most patients experience confusion, fatigue, muscle pain and/or a headache. Thus, one should permit the individual to sleep. For the next few days, reassurance is essential. Being calm and helping reorient the person is also of importance.   Most seizures are painless and end spontaneously. Seizures are not harmful to others but can lead to complications such as stress on the lungs, brain and the heart. Individuals with prior lung problems may develop labored breathing and respiratory distress.    CRITICAL CARE Performed by: Charlsie Quest   Total critical care time: 35 minutes  Critical care time was exclusive of separately billable procedures and treating other patients.  Critical care was necessary to treat or prevent imminent or life-threatening deterioration.  Critical care was time spent personally by me on the following activities: development of treatment plan with patient and/or surrogate as well as nursing, discussions with consultants, evaluation of patient's response to treatment, examination of patient, obtaining history from patient or surrogate, ordering and performing treatments and interventions, ordering and review  of laboratory studies, ordering and review of radiographic studies, pulse oximetry and re-evaluation of patient's condition.   Lindie Spruce Epilepsy [facility] For questions after 5pm please refer to AMION to reach the Neurologist on call

## 2019-09-06 NOTE — Progress Notes (Addendum)
NAME:  Micheal Levy, MRN:  [id number], DOB:  1981-01-17, LOS: 2 ADMISSION DATE:  09/04/2019, CONSULTATION DATE:  09/04/2019 REFERRING MD: [place] , CHIEF COMPLAINT:  Seizures/ Hyperglycemia  Brief History   Micheal Levy is a 39 y.o. male with a history of substance abuse, DM, HTN , seizures in the setting of hyperglycemia, and previous stroke.who presented to the ED 5/30 early am with seizures, hyperglycemia and HTN. CT and MRI Head were negative for stroke. CTA was negative for PE.  He was intubated for airway protection. PCCM were asked to admit and manage care.   Past Medical History   Past Medical History:  Diagnosis Date  . Chronic kidney disease   . Diabetes mellitus without complication ([place])   . Elevated serum cholesterol 04/2019  . Elevated serum creatinine 04/2019  . History of seizures   . Hyperglycemia   . Hypertension   . Hypertensive urgency 04/2019  . Hypokalemia   . Seizures ([place]) 03/2017   x 2   . Stroke ([place]) 03/2017  . Substance abuse ([place])   . Vitamin D deficiency 04/2019    Significant Hospital Events   5/30: Admission 6/1 LFTs up, resp culture + staph  Consults:  5/30 Neuro >> Code Stroke RO 5/30 PCCM 5/31>>failed SBT Procedures:  ETT 5/30>>  Significant Diagnostic Tests:  5/30 CTA Head / Cerebral PerfusionNo emergent large vessel occlusion or high-grade stenosis.  Areas of calculated ischemia are likely artifactual.  09/04/2019 MRI Head No evidence of recent infarction, hemorrhage, or mass. Mild cerebral volume loss and chronic microvascular ischemic changes  5/31 EEG> no epileptiform activity; severe diffuse encephalopathy  09/04/2019 CTA Chest No pulmonary embolism. Dilated main pulmonary artery, suggesting pulmonary arterial hypertension. Mild cardiomegaly. Mosaic attenuation throughout both lungs, nonspecific, probably due to mosaic perfusion from pulmonary vascular disease. Right pulmonary nodules, largest 3 mm. No follow-up needed if  patient is low-risk  6/1 CXR> progressive pulm edema with cardiomegaly  Micro Data:  5/30: SARS COVID 2 >> Negative 5/31 BC x2>>NGTD 5/31 resp culture>>+staph auerus  Antimicrobials:  Rocephin 5/31>>6/1 Vanc 6/1>>  Interim history/subjective:  Febrile overnight--Tmax 103.7F Awake and following commands this morning during SBT. Denies pain.   Objective   Blood pressure (!) 135/110, pulse 62, temperature 99.1 F (37.3 C), resp. rate 16, height '6\' 1"'$  (1.854 m), weight 107.7 kg, SpO2 100 %.    Vent Mode: PRVC FiO2 (%):  [40 %] 40 % Set Rate:  [14 bmp] 14 bmp Vt Set:  [630 mL] 630 mL PEEP:  [5 cmH20] 5 cmH20 Pressure Support:  [15 cmH20] 15 cmH20 Plateau Pressure:  [25 cmH20-26 cmH20] 25 cmH20   Intake/Output Summary (Last 24 hours) at 09/06/2019 0817 Last data filed at 09/06/2019 0600 Gross per 24 hour  Intake 3199.67 ml  Output 750 ml  Net 2449.67 ml   Filed Weights   09/04/19 0453  Weight: 107.7 kg    Examination:  General: critically ill appearing HENT: ETT PULM: on CPAP vent. Diffuse crackles and rhonchi bilaterally CV: RRR. No peripheral edema GI: abd distended. bs active. nontender to palpation.  MSK: normal bulk and tone Neuro: sedated but following commands. PERRL. Moving all four extremities to command.   Resolved Hospital Problem list     Assessment & Plan:   Seizure. Etiology remains unclear. History of seizures in setting of hyperglycemia/hypertension. Glucose ~600 on admission but have improved. SBPs in the 200s on admission. No findings on head imaging to explain seizure UDS negative Given his  fevers, can not rule out infectious source, which may also have been contributing to elevated glucose levels. Plan Continue keppra Seizure precautions Will try to reach out to neuro today for evaluation  T2DM uncontrolled--A1C 11.4 on admission. Glucoses ~600 on admission. Glucoses improving but still remain elevated. Plan Increase lantus to 18U  bid Continue SSI q4h  Acute hypercapnic respiratory failure--intubated in ED for airway protection.  CXR showing progressive pulm edema. Up 3.8L from admission.  Failed SBT 5/31. Weaned to CPAP this morning. Plan Continue daily WUA/SBTs. VAP bundle  Acute on chronic renal injury. Slight worsening from admission. Suspect may be cardiorenal vs pre-renal. Plan: will continue IVF for today. If no improvement tomorrow, then may need to consider diuresis.  HFpEF (LV G2DD). Severe LVH.  Up 3.8L from admission. CXR showing progressive pulm edema however not appearing grossly volume loaded on exam Hypertension. SBPs in the 200s on admission. Pressures slowly improving. Plan Continue hydral, hctz, lopressor, amlodipine.   May consider further diuresis if pulm edema seems to be barring extubation  Sepsis. Hemodynamically stable.  Fever. Tmax 103.24F overnight. Lactate remaining minimally elevated which could be related to heart failure.  Aspiration pneumonia. Resp culture + staph. Unclear source. RLL infiltrate on CXR. NGTD on blood cultures. Can not rule out CNS source, especially in setting of presenting seizure. Mild leukocytosis on admission which has since resolved Plan Will consider obtaining LP  D/c rocephin, start vanc Follow blood and resp cultures  Transaminitis. LFTs normal on admission. Bili and alk phos normal so unlikely to be cholestatic or obstruction. Labs suggestive of hepatotoxic process. keppra considered however he is already on this at home so somewhat unlikely. No recent viral hepatitis labs. Plan Check hepatitis panel If not improved tomorrow, could consider imaging for further workup  High risk malnutrition. Continue tube feeds  Best practice:  Diet: NPO Pain/Anxiety/Delirium protocol (if indicated): d/c now VAP protocol (if indicated): Initiated DVT prophylaxis: heparin subcut, SCDs GI prophylaxis: Protonix Glucose control: as above Mobility: BR Code Status:  Full Family Communication: wife updated bedside on 5/31 Disposition: ICU  Labs   CBC: Recent Labs  Lab 09/04/19 0506 09/04/19 0506 09/04/19 0512 09/04/19 0612 09/04/19 0833 09/05/19 0122 09/05/19 0305  WBC 13.0*  --   --   --   --  9.9  --   NEUTROABS 7.5  --   --   --   --   --   --   HGB 14.1   < > 15.6 16.3 13.9 11.5* 11.9*  HCT 44.3   < > 46.0 48.0 41.0 36.8* 35.0*  MCV 78.5*  --   --   --   --  80.3  --   PLT 322  --   --   --   --  213  --    < > = values in this interval not displayed.    Basic Metabolic Panel: Recent Labs  Lab 09/04/19 0829 09/04/19 0833 09/04/19 1622 09/04/19 1622 09/04/19 2000 09/04/19 2000 09/04/19 2104 09/05/19 0122 09/05/19 0305 09/05/19 1119 09/05/19 1657 09/06/19 0525  NA  --    < > 140   < > 137  --  139 135 139  --   --  137  K  --    < > 3.1*   < > 4.3   < > 3.3* 3.0* 2.9* 3.5  --  4.0  CL  --    < > 101  --  98  --  101  100  --   --   --  103  CO2  --    < > 29  --  24  --  28 25  --   --   --  22  GLUCOSE  --    < > 165*  --  121*  --  111* 458*  --   --   --  287*  BUN  --    < > 17  --  20  --  18 17  --   --   --  26*  CREATININE  --    < > 2.02*  --  2.10*  --  2.06* 2.04*  --   --   --  2.75*  CALCIUM  --    < > 8.6*  --  8.1*  --  8.4* 7.8*  --   --   --  8.7*  MG 2.0  --   --   --   --   --   --  1.7  --   --  2.4 2.4  PHOS  --   --   --   --   --   --   --  4.3  --   --  4.5 5.0*   < > = values in this interval not displayed.   GFR: Estimated Creatinine Clearance: 45.5 mL/min (A) (by C-G formula based on SCr of 2.75 mg/dL (H)). Recent Labs  Lab 09/04/19 0506 09/05/19 0122 09/06/19 0148 09/06/19 0525  WBC 13.0* 9.9  --   --   LATICACIDVEN  --   --  2.4* 2.4*    Liver Function Tests: Recent Labs  Lab 09/04/19 0506 09/06/19 0525  AST 30 831*  ALT 21 326*  ALKPHOS 109 63  BILITOT 0.9 1.2  PROT 7.3 6.4*  ALBUMIN 3.7 2.7*   No results for input(s): LIPASE, AMYLASE in the last 168 hours. No results for  input(s): AMMONIA in the last 168 hours.  ABG    Component Value Date/Time   PHART 7.389 09/04/2019 0833   PCO2ART 53.3 (H) 09/04/2019 0833   PO2ART 93 09/04/2019 0833   HCO3 29.1 (H) 09/05/2019 0305   TCO2 30 09/05/2019 0305   ACIDBASEDEF 0.8 03/24/2017 0330   O2SAT 98.0 09/05/2019 0305     Coagulation Profile: Recent Labs  Lab 09/04/19 0506  INR 1.0    Cardiac Enzymes: No results for input(s): CKTOTAL, CKMB, CKMBINDEX, TROPONINI in the last 168 hours.  HbA1C: Hemoglobin A1C  Date/Time Value Ref Range Status  04/19/2019 10:38 AM 7.9 (A) 4.0 - 5.6 % Final  01/04/2018 11:43 AM 8.7 (A) 4.0 - 5.6 % Final   Hgb A1c MFr Bld  Date/Time Value Ref Range Status  09/04/2019 06:22 PM 11.4 (H) 4.8 - 5.6 % Final    Comment:    (NOTE) Pre diabetes:          5.7%-6.4% Diabetes:              >6.4% Glycemic control for   <7.0% adults with diabetes   06/04/2017 11:11 AM 7.0 (H) 4.8 - 5.6 % Final    Comment:             Prediabetes: 5.7 - 6.4          Diabetes: >6.4          Glycemic control for adults with diabetes: <7.0     CBG: Recent Labs  Lab 09/05/19 1542 09/05/19 1956 09/05/19 2344  09/06/19 0319 09/06/19 [place], MD 09/06/19 8:17 AM

## 2019-09-06 NOTE — Progress Notes (Signed)
Initial Nutrition Assessment  DOCUMENTATION CODES:   Obesity unspecified  INTERVENTION:   Tube feeding via OG tube: Vital High Protein at 40 ml/h (960 ml per day) Pro-stat 60 ml TID MVI with minerals daily  Provides 1560 kcal, 174 gm protein, 802 ml free water daily   NUTRITION DIAGNOSIS:   Inadequate oral intake related to inability to eat as evidenced by NPO status.  GOAL:   Provide needs based on ASPEN/SCCM guidelines  MONITOR:   TF tolerance, Labs  REASON FOR ASSESSMENT:   Consult, Ventilator Enteral/tube feeding initiation and management  ASSESSMENT:   Pt with PMH of substance abuse, CKD, DM, HTN, and seizures in setting of hyperglycemia, previous stroke who was admitted 5/30 with seizures, hyperglycemia and HTN.   Pt discussed during ICU rounds and with RN. Noted fevers, pt with staph aureus pneumonia. Not being extubated today.   Patient is currently intubated on ventilator support MV: 17.1 L/min Temp (24hrs), Avg:101.2 F (38.4 C), Min:98.1 F (36.7 C), Max:103.8 F (39.9 C)  Propofol: off Medications reviewed and include: colace, SSI, 18 u levemir BID, miralax Labs reviewed: PO4: 5 (H), AST: 831 (H), ALT: 326 (H) CBG's: 258-267 Lab Results  Component Value Date   HGBA1C 11.4 (H) 09/04/2019   73F OG tube; per xray tip in stomach  TF: Vital High Protein @ 40 ml/hr with 30 ml Prostat BID Provides: 1160 kcal and 114 grams protein    NUTRITION - FOCUSED PHYSICAL EXAM:    Most Recent Value  Orbital Region  No depletion  Upper Arm Region  No depletion  Thoracic and Lumbar Region  No depletion  Buccal Region  No depletion  Temple Region  No depletion  Clavicle Bone Region  No depletion  Clavicle and Acromion Bone Region  No depletion  Scapular Bone Region  No depletion  Dorsal Hand  No depletion  Patellar Region  No depletion  Anterior Thigh Region  No depletion  Posterior Calf Region  No depletion  Edema (RD Assessment)  Mild  Hair   Reviewed  Eyes  Unable to assess  Mouth  Unable to assess  Skin  Reviewed  Nails  Reviewed       Diet Order:   Diet Order            Diet NPO time specified  Diet effective now              EDUCATION NEEDS:   Not appropriate for education at this time  Skin:  Skin Assessment: Reviewed RN Assessment  Last BM:  unknown  Height:   Ht Readings from Last 1 Encounters:  09/04/19 6\' 1"  (1.854 m)    Weight:   Wt Readings from Last 1 Encounters:  09/04/19 107.7 kg    Ideal Body Weight:  83.6 kg  BMI:  Body mass index is 31.33 kg/m.  Estimated Nutritional Needs:   Kcal:  1500  Protein:  167-209 grams  Fluid:  2 L/day  09/06/19., RD, LDN, CNSC See AMiON for contact information

## 2019-09-06 NOTE — Procedures (Signed)
Patient Name: Micheal Levy  MRN: [id number]  Epilepsy Attending: Charlsie Quest  Referring Physician/Provider: Dr. Onalee Hua Duration: 09/05/2019 7044 to 09/06/2019 0932  Patient history: 39 year old male with history of seizures in the setting of hyperglycemia presented with recurrent seizures in the setting of hyperglycemia and medication noncompliance.  EEG to evaluate for seizures.  Level of alertness: comatose  AEDs during EEG study: Keppra  Technical aspects: This EEG study was done with scalp electrodes positioned according to the 10-20 International system of electrode placement. Electrical activity was acquired at a sampling rate of 500Hz  and reviewed with a high frequency filter of 70Hz  and a low frequency filter of 1Hz . EEG data were recorded continuously and digitally stored.   Description:  EEG initially showed continuous generalized 2-3 delta slowing. Hyperventilation and photic stimulation were not performed.     ABNORMALITY -Continuous slow, generalized  IMPRESSION: This study is suggestive of severe diffuse encephalopathy, nonspecific etiology but likely related to sedation. No seizures or epileptiform discharges were seen throughout the recording.  Areen Trautner 

## 2019-09-06 NOTE — Progress Notes (Signed)
Inpatient Diabetes Program Recommendations  AACE/ADA: New Consensus Statement on Inpatient Glycemic Control (2015)  Target Ranges:  Prepandial:   less than 140 mg/dL      Peak postprandial:   less than 180 mg/dL (1-2 hours)      Critically ill patients:  140 - 180 mg/dL   Lab Results  Component Value Date   GLUCAP 302 (H) 09/06/2019   HGBA1C 11.4 (H) 09/04/2019    Review of Glycemic Control  Diabetes history: DM2 Outpatient Diabetes medications: metformin 1000 mg bid, glipizide 10 mg QAM, Lantus 20 units QHS (not taking) Current orders for Inpatient glycemic control: Levemir 18 units bid, Novolog 0-15 units Q4H.  HgbA1C - 11.4% - uncontrolled PCP - [facility] Needs TF coverage.   Inpatient Diabetes Program Recommendations:     Consider adding Novolog 3 units Q4H for TF coverage. Do not give if TF on Hold.  Will follow.  Thank you. Ailene Ards, RD, LDN, CDE Inpatient Diabetes Coordinator [phone]

## 2019-09-06 NOTE — Progress Notes (Signed)
Attending:    Subjective: High fever LFT's elevated Awake, following commands  Objective: Vitals:   09/06/19 0600 09/06/19 0800 09/06/19 0900 09/06/19 0908  BP: (!) 135/110 (!) 157/124 (!) 135/101   Pulse: 62 72  65  Resp: 16 (!) 35 (!) 29 (!) 29  Temp: 99.1 F (37.3 C) 98.1 F (36.7 C) 98.2 F (36.8 C) 98.2 F (36.8 C)  TempSrc:      SpO2: 100% 99% 100% 97%  Weight:      Height:       Vent Mode: CPAP;PSV FiO2 (%):  [40 %] 40 % Set Rate:  [14 bmp] 14 bmp Vt Set:  [630 mL] 630 mL PEEP:  [5 cmH20] 5 cmH20 Pressure Support:  [5 cmH20-15 cmH20] 5 cmH20 Plateau Pressure:  [25 cmH20-26 cmH20] 25 cmH20  Intake/Output Summary (Last 24 hours) at 09/06/2019 1012 Last data filed at 09/06/2019 0900 Gross per 24 hour  Intake 3265.86 ml  Output 750 ml  Net 2515.86 ml    General:  In bed on vent HENT: NCAT ETT in place PULM: CTA B, vent supported breathing CV: RRR, no mgr GI: BS+, soft, nontender MSK: normal bulk and tone Neuro: sedated on vent     CBC    Component Value Date/Time   WBC 9.9 09/05/2019 0122   RBC 4.58 09/05/2019 0122   HGB 11.9 (L) 09/05/2019 0305   HGB 13.0 04/19/2019 1134   HCT 35.0 (L) 09/05/2019 0305   HCT 39.7 04/19/2019 1134   PLT 213 09/05/2019 0122   PLT 222 04/19/2019 1134   MCV 80.3 09/05/2019 0122   MCV 79 04/19/2019 1134   MCH 25.1 (L) 09/05/2019 0122   MCHC 31.3 09/05/2019 0122   RDW 15.8 (H) 09/05/2019 0122   RDW 16.4 (H) 04/19/2019 1134   LYMPHSABS 4.6 (H) 09/04/2019 0506   LYMPHSABS 2.8 04/19/2019 1134   MONOABS 0.7 09/04/2019 0506   EOSABS 0.2 09/04/2019 0506   EOSABS 0.2 04/19/2019 1134   BASOSABS 0.1 09/04/2019 0506   BASOSABS 0.1 04/19/2019 1134    BMET    Component Value Date/Time   NA 137 09/06/2019 0525   NA 138 04/19/2019 1134   K 4.0 09/06/2019 0525   CL 103 09/06/2019 0525   CO2 22 09/06/2019 0525   GLUCOSE 287 (H) 09/06/2019 0525   BUN 26 (H) 09/06/2019 0525   BUN 26 (H) 04/19/2019 1134   CREATININE 2.75  (H) 09/06/2019 0525   CALCIUM 8.7 (L) 09/06/2019 0525   GFRNONAA 27 (L) 09/06/2019 0525   GFRAA 32 (L) 09/06/2019 0525    CXR images reviewed: consolidation RLL, effusion on R?  resp culture: staph aureus  Impression/Plan: Staph aureus pneumonia: add vancomycin, stop cefriaxone, monitor culture Acute respiratory failure: WUA/SBT today as well, consider extubation Elevated AST/ALT> drug related? Ask neurology if OK changing off keppra because no other clear cause, repeat LFT, check acute hepatitis panel AKI> continue saline 75cc/hr Hyperglycemia/DM2 > increase lantus, continue SSI Hypertension: continue hydralazine, amlodipine, hydrochlorathiazide, metoprolol  My cc time 31 minutes  Heber , MD [facility] Pager: [phone] Cell: [phone] After 3pm or if no response, call [phone]

## 2019-09-06 NOTE — Progress Notes (Signed)
EEG maint complete. No skin breakdown. Added head wrap

## 2019-09-06 NOTE — Progress Notes (Addendum)
eLink Physician-Brief Progress Note Patient Name: Micheal Levy DOB: [birth year] MRN: [id number]   Date of Service  09/06/2019  HPI/Events of Note  Agitation - BP = 153/111 and HR = 80   eICU Interventions  Plan: 1. Will increase ceiling on Precedex IV infusion to 1.8 mcg/kg/hour.      Intervention Category Major Interventions: Delirium, psychosis, severe agitation - evaluation and management  Micheal Levy 09/06/2019, 8:50 PM

## 2019-09-07 LAB — CBC
HCT: 39.7 % (ref 39.0–52.0)
Hemoglobin: 12.1 g/dL — ABNORMAL LOW (ref 13.0–17.0)
MCH: 25 pg — ABNORMAL LOW (ref 26.0–34.0)
MCHC: 30.5 g/dL (ref 30.0–36.0)
MCV: 82 fL (ref 80.0–100.0)
Platelets: 200 10*3/uL (ref 150–400)
RBC: 4.84 MIL/uL (ref 4.22–5.81)
RDW: 16.5 % — ABNORMAL HIGH (ref 11.5–15.5)
WBC: 16.6 10*3/uL — ABNORMAL HIGH (ref 4.0–10.5)
nRBC: 0 % (ref 0.0–0.2)

## 2019-09-07 LAB — COMPREHENSIVE METABOLIC PANEL
ALT: 267 U/L — ABNORMAL HIGH (ref 0–44)
AST: 319 U/L — ABNORMAL HIGH (ref 15–41)
Albumin: 2.1 g/dL — ABNORMAL LOW (ref 3.5–5.0)
Alkaline Phosphatase: 71 U/L (ref 38–126)
Anion gap: 10 (ref 5–15)
BUN: 36 mg/dL — ABNORMAL HIGH (ref 6–20)
CO2: 22 mmol/L (ref 22–32)
Calcium: 8.6 mg/dL — ABNORMAL LOW (ref 8.9–10.3)
Chloride: 108 mmol/L (ref 98–111)
Creatinine, Ser: 2.18 mg/dL — ABNORMAL HIGH (ref 0.61–1.24)
GFR calc Af Amer: 42 mL/min — ABNORMAL LOW (ref >60)
GFR calc non Af Amer: 36 mL/min — ABNORMAL LOW (ref >60)
Glucose, Bld: 144 mg/dL — ABNORMAL HIGH (ref 70–99)
Potassium: 3.5 mmol/L (ref 3.5–5.1)
Sodium: 140 mmol/L (ref 135–145)
Total Bilirubin: 1 mg/dL (ref 0.3–1.2)
Total Protein: 5.7 g/dL — ABNORMAL LOW (ref 6.5–8.1)

## 2019-09-07 LAB — GLUCOSE, CAPILLARY
Glucose-Capillary: 141 mg/dL — ABNORMAL HIGH (ref 70–99)
Glucose-Capillary: 174 mg/dL — ABNORMAL HIGH (ref 70–99)
Glucose-Capillary: 200 mg/dL — ABNORMAL HIGH (ref 70–99)
Glucose-Capillary: 239 mg/dL — ABNORMAL HIGH (ref 70–99)
Glucose-Capillary: 266 mg/dL — ABNORMAL HIGH (ref 70–99)
Glucose-Capillary: 320 mg/dL — ABNORMAL HIGH (ref 70–99)

## 2019-09-07 LAB — CULTURE, RESPIRATORY W GRAM STAIN

## 2019-09-07 LAB — HCV INTERPRETATION

## 2019-09-07 LAB — HCV AB W REFLEX TO QUANT PCR: HCV Ab: <0.1 s/co ratio (ref 0.0–0.9)

## 2019-09-07 MED ORDER — FUROSEMIDE 10 MG/ML IJ SOLN
60.0000 mg | Freq: Once | INTRAMUSCULAR | Status: AC
  Administered 2019-09-07: 60 mg via INTRAVENOUS
  Filled 2019-09-07: qty 6

## 2019-09-07 MED ORDER — CEFAZOLIN SODIUM-DEXTROSE 2-4 GM/100ML-% IV SOLN
2.0000 g | Freq: Three times a day (TID) | INTRAVENOUS | Status: AC
  Administered 2019-09-07 – 2019-09-11 (×15): 2 g via INTRAVENOUS
  Filled 2019-09-07 (×15): qty 100

## 2019-09-07 MED ORDER — FUROSEMIDE 10 MG/ML IJ SOLN: 40.0000 mg | Freq: Once | INTRAMUSCULAR | Status: DC

## 2019-09-07 MED ORDER — INSULIN DETEMIR 100 UNIT/ML ~~LOC~~ SOLN
24.0000 [IU] | Freq: Two times a day (BID) | SUBCUTANEOUS | Status: DC
  Administered 2019-09-07 – 2019-09-08 (×4): 24 [IU] via SUBCUTANEOUS
  Filled 2019-09-07 (×6): qty 0.24

## 2019-09-07 NOTE — Progress Notes (Addendum)
NAME:  Micheal Levy, MRN:  [id number], DOB:  16-Nov-1980, LOS: 3 ADMISSION DATE:  09/04/2019, CONSULTATION DATE:  09/04/2019 REFERRING MD: Rancour , CHIEF COMPLAINT:  Seizures/ Hyperglycemia  Brief History   Micheal Levy is a 39 y.o. male with a history of substance abuse, DM, HTN , seizures in the setting of hyperglycemia, and previous stroke.who presented to the ED 5/30 early am with seizures, hyperglycemia and HTN. CT and MRI Head were negative for stroke. CTA was negative for PE.  He was intubated for airway protection. PCCM were asked to admit and manage care.   Past Medical History  CKD, T2DM, Seizures, HTN  Significant Hospital Events   5/30: Admission 6/1 LFTs up, resp culture + staph 6/2 LFTs improving  Consults:  5/30 Neuro >> Code Stroke RO 5/30 PCCM 5/31>>failed SBT Procedures:  ETT 5/30>>  Significant Diagnostic Tests:  5/30 CTA Head / Cerebral PerfusionNo emergent large vessel occlusion or high-grade stenosis.  Areas of calculated ischemia are likely artifactual.  09/04/2019 MRI Head No evidence of recent infarction, hemorrhage, or mass. Mild cerebral volume loss and chronic microvascular ischemic changes  5/31 EEG> no epileptiform activity; severe diffuse encephalopathy  09/04/2019 CTA Chest No pulmonary embolism. Dilated main pulmonary artery, suggesting pulmonary arterial hypertension. Mild cardiomegaly. Mosaic attenuation throughout both lungs, nonspecific, probably due to mosaic perfusion from pulmonary vascular disease. Right pulmonary nodules, largest 3 mm. No follow-up needed if patient is low-risk  6/1 CXR> progressive pulm edema with cardiomegaly  Micro Data:  5/30: SARS COVID 2 >> Negative 5/31 BC x2>>NGTD 6/1 resp culture>>+MSSA  Antimicrobials:  Rocephin 5/31>>6/1 Vanc 6/1>>6/2 Cefazolin 6/2>>  Interim history/subjective:  Tmax overnight 100.8F  Objective   Blood pressure (!) 140/105, pulse 72, temperature (!) 100.5 F (38.1 C), temperature  source Axillary, resp. rate (!) 22, height 6\' 1"  (1.854 m), weight 107.7 kg, SpO2 97 %.    Vent Mode: PRVC FiO2 (%):  [40 %] 40 % Set Rate:  [14 bmp] 14 bmp Vt Set:  [630 mL] 630 mL PEEP:  [5 cmH20] 5 cmH20 Pressure Support:  [5 cmH20] 5 cmH20 Plateau Pressure:  [24 cmH20-27 cmH20] 27 cmH20   Intake/Output Summary (Last 24 hours) at 09/07/2019 0740 Last data filed at 09/07/2019 0600 Gross per 24 hour  Intake 4043.52 ml  Output 1450 ml  Net 2593.52 ml   Filed Weights   09/04/19 0453  Weight: 107.7 kg    Examination:  General: critically ill appearing HENT: ETT PULM: on PRVC min vent settings. Lungs sound clear today CV: RRR. +1 edema to LE GI: abd distended and firm. bs active. nontender to palpation.  MSK: normal bulk and tone Neuro: awake. Following commands. 4/5 strength in upper and lower extremities.  Resolved Hospital Problem list     Assessment & Plan:   Seizure. Etiology remains unclear. History of seizures in setting of hyperglycemia/hypertension. Glucose ~600 on admission but have improved. SBPs in the 200s on admission. No findings on head imaging to explain seizure. UDS negative. No epileptiform activity on LTM No further seizures since admission Plan Management per neuro Continue keppra Seizure precautions  Acute hypercapnic respiratory failure--intubated in ED for airway protection.  CXR showing progressive pulm edema. Up 3.8L from admission.  Failed SBT 5/31 and 6/1 due to becoming tachypneic after being on CPAP for a while.  Aspiration pneumonia. Resp culture +MSSA. RLL infiltrate on CXR.  White count up to 16 this morning. Tmax 100.8F overnight Plan:  D/c vanc. Start cefazolin--tx day #  2/7 Will also give a dose of lasix which may assist with getting closer to extubation Follow blood cultures. Continue daily WUA/SBTs. VAP bundle.  Acute on chronic renal injury. Improved from yesterday Plan: continue to monitor  HFpEF (LV G2DD). Severe LVH.  Up  +6L from admission. CXR showing progressive pulm edema however not appearing grossly volume loaded on exam Hypertension. Blood pressures improving. Plan Continue hydral, hctz, lopressor, amlodipine.   One time dose of 40mg  lasix Strict I/O  Transaminitis. LFTs normal on admission. Pattern consistent with hepatotoxic process. Viral hepatitis panel negative. LFTs improving today. Plan: Continue to monitor.  T2DM uncontrolled--A1C 11.4 on admission. Glucoses ~600 on admission.  Glucoses improving Plan: increase lantus to 24U. Continue SSI  High risk malnutrition. Continue tube feeds  Best practice:  Diet: NPO Pain/Anxiety/Delirium protocol (if indicated): d/c now VAP protocol (if indicated): Initiated DVT prophylaxis: heparin subcut, SCDs GI prophylaxis: Protonix Glucose control: as above Mobility: BR Code Status: Full Family Communication: wife updated bedside on 5/31 Disposition: ICU  Labs   CBC: Recent Labs  Lab 09/04/19 0506 09/04/19 09/06/19 09/04/19 0612 09/04/19 09/06/19 09/05/19 0122 09/05/19 0305 09/07/19 0551  WBC 13.0*  --   --   --  9.9  --  16.6*  NEUTROABS 7.5  --   --   --   --   --   --   HGB 14.1   < > 16.3 13.9 11.5* 11.9* 12.1*  HCT 44.3   < > 48.0 41.0 36.8* 35.0* 39.7  MCV 78.5*  --   --   --  80.3  --  82.0  PLT 322  --   --   --  213  --  200   < > = values in this interval not displayed.    Basic Metabolic Panel: Recent Labs  Lab 09/04/19 0829 09/04/19 09/06/19 09/04/19 2000 09/04/19 2000 09/04/19 2104 09/04/19 2104 09/05/19 0122 09/05/19 0305 09/05/19 1119 09/05/19 1657 09/06/19 0525 09/06/19 1650 09/07/19 0551  NA  --    < > 137   < > 139  --  135 139  --   --  137  --  140  K  --    < > 4.3   < > 3.3*   < > 3.0* 2.9* 3.5  --  4.0  --  3.5  CL  --    < > 98  --  101  --  100  --   --   --  103  --  108  CO2  --    < > 24  --  28  --  25  --   --   --  22  --  22  GLUCOSE  --    < > 121*  --  111*  --  458*  --   --   --  287*  --  144*    BUN  --    < > 20  --  18  --  17  --   --   --  26*  --  36*  CREATININE  --    < > 2.10*  --  2.06*  --  2.04*  --   --   --  2.75*  --  2.18*  CALCIUM  --    < > 8.1*  --  8.4*  --  7.8*  --   --   --  8.7*  --  8.6*  MG 2.0  --   --   --   --   --  1.7  --   --  2.4 2.4 2.2  --   PHOS  --   --   --   --   --   --  4.3  --   --  4.5 5.0* 3.6  --    < > = values in this interval not displayed.   GFR: Estimated Creatinine Clearance: 57.4 mL/min (A) (by C-G formula based on SCr of 2.18 mg/dL (H)). Recent Labs  Lab 09/04/19 0506 09/05/19 0122 09/06/19 0148 09/06/19 0525 09/07/19 0551  WBC 13.0* 9.9  --   --  16.6*  LATICACIDVEN  --   --  2.4* 2.4*  --     Liver Function Tests: Recent Labs  Lab 09/04/19 0506 09/06/19 0525 09/07/19 0551  AST 30 831* 319*  ALT 21 326* 267*  ALKPHOS 109 63 71  BILITOT 0.9 1.2 1.0  PROT 7.3 6.4* 5.7*  ALBUMIN 3.7 2.7* 2.1*   No results for input(s): LIPASE, AMYLASE in the last 168 hours. No results for input(s): AMMONIA in the last 168 hours.  ABG    Component Value Date/Time   PHART 7.389 09/04/2019 0833   PCO2ART 53.3 (H) 09/04/2019 0833   PO2ART 93 09/04/2019 0833   HCO3 29.1 (H) 09/05/2019 0305   TCO2 30 09/05/2019 0305   ACIDBASEDEF 0.8 03/24/2017 0330   O2SAT 98.0 09/05/2019 0305     Coagulation Profile: Recent Labs  Lab 09/04/19 0506  INR 1.0    Cardiac Enzymes: No results for input(s): CKTOTAL, CKMB, CKMBINDEX, TROPONINI in the last 168 hours.  HbA1C: Hemoglobin A1C  Date/Time Value Ref Range Status  04/19/2019 10:38 AM 7.9 (A) 4.0 - 5.6 % Final  01/04/2018 11:43 AM 8.7 (A) 4.0 - 5.6 % Final   Hgb A1c MFr Bld  Date/Time Value Ref Range Status  09/04/2019 06:22 PM 11.4 (H) 4.8 - 5.6 % Final    Comment:    (NOTE) Pre diabetes:          5.7%-6.4% Diabetes:              >6.4% Glycemic control for   <7.0% adults with diabetes   06/04/2017 11:11 AM 7.0 (H) 4.8 - 5.6 % Final    Comment:             Prediabetes:  5.7 - 6.4          Diabetes: >6.4          Glycemic control for adults with diabetes: <7.0     CBG: Recent Labs  Lab 09/06/19 1140 09/06/19 1542 09/06/19 2003 09/06/19 2349 09/07/19 0355  GLUCAP 248* 302* 302* 242* 141*     Dessire Grimes, MD 09/07/19 7:40 AM

## 2019-09-08 ENCOUNTER — Inpatient Hospital Stay (HOSPITAL_COMMUNITY): Payer: Self-pay

## 2019-09-08 LAB — COMPREHENSIVE METABOLIC PANEL
ALT: 248 U/L — ABNORMAL HIGH (ref 0–44)
AST: 285 U/L — ABNORMAL HIGH (ref 15–41)
Albumin: 2 g/dL — ABNORMAL LOW (ref 3.5–5.0)
Alkaline Phosphatase: 67 U/L (ref 38–126)
Anion gap: 15 (ref 5–15)
BUN: 46 mg/dL — ABNORMAL HIGH (ref 6–20)
CO2: 23 mmol/L (ref 22–32)
Calcium: 8.7 mg/dL — ABNORMAL LOW (ref 8.9–10.3)
Chloride: 104 mmol/L (ref 98–111)
Creatinine, Ser: 2 mg/dL — ABNORMAL HIGH (ref 0.61–1.24)
GFR calc Af Amer: 47 mL/min — ABNORMAL LOW (ref >60)
GFR calc non Af Amer: 40 mL/min — ABNORMAL LOW (ref >60)
Glucose, Bld: 221 mg/dL — ABNORMAL HIGH (ref 70–99)
Potassium: 3 mmol/L — ABNORMAL LOW (ref 3.5–5.1)
Sodium: 142 mmol/L (ref 135–145)
Total Bilirubin: 0.7 mg/dL (ref 0.3–1.2)
Total Protein: 5.5 g/dL — ABNORMAL LOW (ref 6.5–8.1)

## 2019-09-08 LAB — CBC
HCT: 38.2 % — ABNORMAL LOW (ref 39.0–52.0)
Hemoglobin: 11.5 g/dL — ABNORMAL LOW (ref 13.0–17.0)
MCH: 24.6 pg — ABNORMAL LOW (ref 26.0–34.0)
MCHC: 30.1 g/dL (ref 30.0–36.0)
MCV: 81.8 fL (ref 80.0–100.0)
Platelets: 219 10*3/uL (ref 150–400)
RBC: 4.67 MIL/uL (ref 4.22–5.81)
RDW: 16.4 % — ABNORMAL HIGH (ref 11.5–15.5)
WBC: 13.8 10*3/uL — ABNORMAL HIGH (ref 4.0–10.5)
nRBC: 0 % (ref 0.0–0.2)

## 2019-09-08 LAB — GLUCOSE, CAPILLARY
Glucose-Capillary: 153 mg/dL — ABNORMAL HIGH (ref 70–99)
Glucose-Capillary: 156 mg/dL — ABNORMAL HIGH (ref 70–99)
Glucose-Capillary: 176 mg/dL — ABNORMAL HIGH (ref 70–99)
Glucose-Capillary: 195 mg/dL — ABNORMAL HIGH (ref 70–99)
Glucose-Capillary: 198 mg/dL — ABNORMAL HIGH (ref 70–99)
Glucose-Capillary: 201 mg/dL — ABNORMAL HIGH (ref 70–99)

## 2019-09-08 MED ORDER — POTASSIUM CHLORIDE 20 MEQ PO PACK
40.0000 meq | PACK | Freq: Two times a day (BID) | ORAL | Status: AC
  Administered 2019-09-08 (×2): 40 meq
  Filled 2019-09-08 (×2): qty 2

## 2019-09-08 MED ORDER — BISACODYL 10 MG RE SUPP
10.0000 mg | Freq: Once | RECTAL | Status: AC
  Administered 2019-09-08: 10 mg via RECTAL
  Filled 2019-09-08: qty 1

## 2019-09-08 MED ORDER — CLONAZEPAM 0.5 MG PO TABS
0.5000 mg | ORAL_TABLET | Freq: Two times a day (BID) | ORAL | Status: DC
  Administered 2019-09-08 – 2019-09-09 (×4): 0.5 mg
  Filled 2019-09-08 (×4): qty 1

## 2019-09-08 MED ORDER — ACETAMINOPHEN 160 MG/5ML PO SOLN
650.0000 mg | ORAL | Status: DC | PRN
  Administered 2019-09-08: 650 mg
  Filled 2019-09-08: qty 20.3

## 2019-09-08 MED ORDER — INSULIN ASPART 100 UNIT/ML ~~LOC~~ SOLN
3.0000 [IU] | SUBCUTANEOUS | Status: DC
  Administered 2019-09-08 – 2019-09-09 (×6): 3 [IU] via SUBCUTANEOUS

## 2019-09-08 MED ORDER — LEVETIRACETAM 100 MG/ML PO SOLN
1000.0000 mg | Freq: Two times a day (BID) | ORAL | Status: DC
  Administered 2019-09-08 – 2019-09-09 (×3): 1000 mg
  Filled 2019-09-08 (×4): qty 10

## 2019-09-08 MED ORDER — SENNA 8.6 MG PO TABS
1.0000 | ORAL_TABLET | Freq: Two times a day (BID) | ORAL | Status: DC
  Administered 2019-09-08 (×2): 8.6 mg
  Filled 2019-09-08 (×3): qty 1

## 2019-09-08 NOTE — Progress Notes (Signed)
Inpatient Diabetes Program Recommendations  AACE/ADA: New Consensus Statement on Inpatient Glycemic Control (2015)  Target Ranges:  Prepandial:   less than 140 mg/dL      Peak postprandial:   less than 180 mg/dL (1-2 hours)      Critically ill patients:  140 - 180 mg/dL   Results for Micheal Levy, Micheal Levy (MRN [id number]) as of 09/08/2019 07:40  Ref. Range 09/06/2019 23:49 09/07/2019 03:55 09/07/2019 07:51 09/07/2019 12:07 09/07/2019 15:50 09/07/2019 19:52  Glucose-Capillary Latest Ref Range: 70 - 99 mg/dL 888 (H)  5 units NOVOLOG  141 (H)  2 units NOVOLOG  200 (H)  3  units NOVOLOG  320 (H)  11 units NOVOLOG +  24 units LEVEMIR  239 (H)  5 units NOVOLOG  174 (H)  3 units NOVOLOG +  24 units LEVEMIR given at 9pm    Results for Micheal Levy, Micheal Levy (MRN [id number]) as of 09/08/2019 09:07  Ref. Range 09/07/2019 23:45 09/08/2019 03:59 09/08/2019 08:32  Glucose-Capillary Latest Ref Range: 70 - 99 mg/dL 915 (H)  8 units NOVOLOG 198 (H)  3 units NOVOLOG 176 (H)    Home DM Meds: Lantus 20 units Daily (NOT taking)       Glipizide 10 mg Daily       Metformin 1000 mg BID   Current Orders: Levemir 24 units BID       Novolog Moderate Correction Scale/ SSI (0-15 units) Q4 hours     MD- Note Levemir increased to 24 units BID yesterday AM  Remains Intubated and getting tube feedings 40cc/hr  Please consider adding Novolog Tube Feed Coverage before further increasing Levemir  Novolog 3 units Q4 hours  HOLD if tube feeds HELD for any reason     --Will follow patient during hospitalization--  Ambrose Finland RN, MSN, CDE Diabetes Coordinator Inpatient Glycemic Control Team Team Pager: [phone] (8a-5p)   HOLD if tube feeds HELD for any reason

## 2019-09-08 NOTE — Progress Notes (Signed)
eLink Physician-Brief Progress Note Patient Name: Micheal Levy DOB: 1980/10/21 MRN: [id number]   Date of Service  09/08/2019  HPI/Events of Note  Notified of temp 101.5. Blood cultures NGTD x 2 days. Sputum culture MSSA. CXR improved. WBC trending down. Continues to be on Ancef. Previously on Vanc and ceftriaxone.  eICU Interventions  Tylenol prn ordered. If fever persistent will need workup.     Intervention Category Major Interventions: Infection - evaluation and management  Darl Pikes 09/08/2019, 8:30 PM

## 2019-09-08 NOTE — Progress Notes (Signed)
NAME:  Micheal Levy, MRN:  [id number], DOB:  [birth year], LOS: 4 ADMISSION DATE:  09/04/2019, CONSULTATION DATE:  09/04/2019 REFERRING MD: Rancour , CHIEF COMPLAINT:  Seizures/ Hyperglycemia  Brief History   Micheal Levy is a 39 y.o. male with a history of substance abuse, DM, HTN , seizures in the setting of hyperglycemia, and previous stroke.who presented to the ED 5/30 early am with seizures, hyperglycemia and HTN. CT and MRI Head were negative for stroke. CTA was negative for PE.  He was intubated for airway protection. PCCM were asked to admit and manage care.   Past Medical History  CKD, T2DM, Seizures, HTN  Significant Hospital Events   5/30: Admission 6/1 LFTs up, resp culture + staph 6/2 LFTs improving  Consults:  5/30 Neuro >> Code Stroke RO 5/30 PCCM 5/31>>failed SBT Procedures:  ETT 5/30>>  Significant Diagnostic Tests:  5/30 CTA Head / Cerebral PerfusionNo emergent large vessel occlusion or high-grade stenosis.  Areas of calculated ischemia are likely artifactual.  09/04/2019 MRI Head No evidence of recent infarction, hemorrhage, or mass. Mild cerebral volume loss and chronic microvascular ischemic changes  5/31 EEG> no epileptiform activity; severe diffuse encephalopathy  09/04/2019 CTA Chest No pulmonary embolism. Dilated main pulmonary artery, suggesting pulmonary arterial hypertension. Mild cardiomegaly. Mosaic attenuation throughout both lungs, nonspecific, probably due to mosaic perfusion from pulmonary vascular disease. Right pulmonary nodules, largest 3 mm. No follow-up needed if patient is low-risk  6/1 CXR> progressive pulm edema with cardiomegaly  Micro Data:  5/30: SARS COVID 2 >> Negative 5/31 BC x2>>NGTD 6/1 resp culture>>+MSSA  Antimicrobials:  Rocephin 5/31>>6/1 Vanc 6/1>>6/2 Cefazolin 6/2>>  Interim history/subjective:  No acute change overnight  Easily agitated per nursing, sedated on my exam  Hypokalemic  No BM  Objective   Blood  pressure (!) 142/91, pulse 88, temperature 99.8 F (37.7 C), temperature source Axillary, resp. rate (!) 29, height 6\' 1"  (1.854 m), weight 113.2 kg, SpO2 96 %.    Vent Mode: PRVC FiO2 (%):  [40 %] 40 % Set Rate:  [14 bmp] 14 bmp Vt Set:  [630 mL] 630 mL PEEP:  [5 cmH20] 5 cmH20 Pressure Support:  [12 cmH20] 12 cmH20 Plateau Pressure:  [22 cmH20-28 cmH20] 27 cmH20   Intake/Output Summary (Last 24 hours) at 09/08/2019 0945 Last data filed at 09/08/2019 0800 Gross per 24 hour  Intake 2344.83 ml  Output 4100 ml  Net -1755.17 ml   Filed Weights   09/04/19 0453 09/08/19 0419  Weight: 107.7 kg 113.2 kg    Examination:  General: young male, NAD on vent  HENT: ETT PULM: resps even non labored on vent, few scattered rhonchi otherwise clear  CV: RRR. +1 edema to LE GI: abd distended and firm. bs active. nontender to palpation.  MSK: normal bulk and tone Neuro: sedated on precedex, opens eyes to loud voice, follows some commands briefly. awake.   Resolved Hospital Problem list     Assessment & Plan:   Seizure. Etiology remains unclear. History of seizures in setting of hyperglycemia/hypertension. Glucose ~600 on admission but have improved. SBPs in the 200s on admission. No findings on head imaging to explain seizure. UDS negative. No epileptiform activity on LTM No further seizures since admission AMS/ metabolic encephalopathy  Plan Management per neuro Continue keppra - dose adjustments per pharmacy  Seizure precautions Add low dose klonopin per tube and wean precedex as able to facilitate vent wean   Acute hypercapnic respiratory failure--intubated in ED for airway protection.  CXR showing progressive pulm edema. Up 3.8L from admission.  Failed SBT 5/31 and 6/1 due to becoming tachypneic after being on CPAP for a while.  Aspiration pneumonia. Resp culture +MSSA. RLL infiltrate on CXR.  White count up to 16 this morning. Tmax 100.63F overnight Plan:  Continue cefazolin--tx day  #3/7 Follow blood cultures. Continue daily WUA/SBTs. VAP bundle. Wean sedation as able to facilitate vent wean   Acute on chronic renal injury. Improved from yesterday Plan:  F/u chem  Pharmacy dosing medications   HFpEF (LV G2DD). Severe LVH.  Up +6L from admission. CXR showing progressive pulm edema however not appearing grossly volume loaded on exam Hypertension. Blood pressures improving. Plan Continue hydral, hctz, lopressor, amlodipine.   Strict I/O  Transaminitis. LFTs normal on admission. Pattern consistent with hepatotoxic process. Viral hepatitis panel negative. LFTs improving today. Plan:  Continue to monitor.  T2DM uncontrolled--A1C 11.4 on admission. Glucoses ~600 on admission.  Glucoses improving Plan:  continue lantus 24U SSI Add TF coverage  Holding home glipizide and metformin   Constipation  PLAN -  Continue colace, miralax  Add senna   High risk malnutrition. Continue tube feeds  Best practice:  Diet: NPO Pain/Anxiety/Delirium protocol (if indicated): d/c now VAP protocol (if indicated): Initiated DVT prophylaxis: heparin subcut, SCDs GI prophylaxis: Protonix Glucose control: as above Mobility: BR Code Status: Full Family Communication: wife updated bedside on 5/31 Disposition: ICU  Labs   CBC: Recent Labs  Lab 09/04/19 0506 09/04/19 0512 09/04/19 7893 09/05/19 0122 09/05/19 0305 09/07/19 0551 09/08/19 0628  WBC 13.0*  --   --  9.9  --  16.6* 13.8*  NEUTROABS 7.5  --   --   --   --   --   --   HGB 14.1   < > 13.9 11.5* 11.9* 12.1* 11.5*  HCT 44.3   < > 41.0 36.8* 35.0* 39.7 38.2*  MCV 78.5*  --   --  80.3  --  82.0 81.8  PLT 322  --   --  213  --  200 219   < > = values in this interval not displayed.    Basic Metabolic Panel: Recent Labs  Lab 09/04/19 0829 09/04/19 8101 09/04/19 2104 09/04/19 2104 09/05/19 0122 09/05/19 0122 09/05/19 0305 09/05/19 1119 09/05/19 1657 09/06/19 0525 09/06/19 1650 09/07/19 0551  09/08/19 0628  NA  --    < > 139   < > 135  --  139  --   --  137  --  140 142  K  --    < > 3.3*   < > 3.0*   < > 2.9* 3.5  --  4.0  --  3.5 3.0*  CL  --    < > 101  --  100  --   --   --   --  103  --  108 104  CO2  --    < > 28  --  25  --   --   --   --  22  --  22 23  GLUCOSE  --    < > 111*  --  458*  --   --   --   --  287*  --  144* 221*  BUN  --    < > 18  --  17  --   --   --   --  26*  --  36* 46*  CREATININE  --    < >  2.06*  --  2.04*  --   --   --   --  2.75*  --  2.18* 2.00*  CALCIUM  --    < > 8.4*  --  7.8*  --   --   --   --  8.7*  --  8.6* 8.7*  MG 2.0  --   --   --  1.7  --   --   --  2.4 2.4 2.2  --   --   PHOS  --   --   --   --  4.3  --   --   --  4.5 5.0* 3.6  --   --    < > = values in this interval not displayed.   GFR: Estimated Creatinine Clearance: 64.1 mL/min (A) (by C-G formula based on SCr of 2 mg/dL (H)). Recent Labs  Lab 09/04/19 0506 09/05/19 0122 09/06/19 0148 09/06/19 0525 09/07/19 0551 09/08/19 0628  WBC 13.0* 9.9  --   --  16.6* 13.8*  LATICACIDVEN  --   --  2.4* 2.4*  --   --     Liver Function Tests: Recent Labs  Lab 09/04/19 0506 09/06/19 0525 09/07/19 0551 09/08/19 0628  AST 30 831* 319* 285*  ALT 21 326* 267* 248*  ALKPHOS 109 63 71 67  BILITOT 0.9 1.2 1.0 0.7  PROT 7.3 6.4* 5.7* 5.5*  ALBUMIN 3.7 2.7* 2.1* 2.0*   No results for input(s): LIPASE, AMYLASE in the last 168 hours. No results for input(s): AMMONIA in the last 168 hours.  ABG    Component Value Date/Time   PHART 7.389 09/04/2019 0833   PCO2ART 53.3 (H) 09/04/2019 0833   PO2ART 93 09/04/2019 0833   HCO3 29.1 (H) 09/05/2019 0305   TCO2 30 09/05/2019 0305   ACIDBASEDEF 0.8 03/24/2017 0330   O2SAT 98.0 09/05/2019 0305     Coagulation Profile: Recent Labs  Lab 09/04/19 0506  INR 1.0    Cardiac Enzymes: No results for input(s): CKTOTAL, CKMB, CKMBINDEX, TROPONINI in the last 168 hours.  HbA1C: Hemoglobin A1C  Date/Time Value Ref Range Status    04/19/2019 10:38 AM 7.9 (A) 4.0 - 5.6 % Final  01/04/2018 11:43 AM 8.7 (A) 4.0 - 5.6 % Final   Hgb A1c MFr Bld  Date/Time Value Ref Range Status  09/04/2019 06:22 PM 11.4 (H) 4.8 - 5.6 % Final    Comment:    (NOTE) Pre diabetes:          5.7%-6.4% Diabetes:              >6.4% Glycemic control for   <7.0% adults with diabetes   06/04/2017 11:11 AM 7.0 (H) 4.8 - 5.6 % Final    Comment:             Prediabetes: 5.7 - 6.4          Diabetes: >6.4          Glycemic control for adults with diabetes: <7.0     CBG: Recent Labs  Lab 09/07/19 1550 09/07/19 1952 09/07/19 2345 09/08/19 0359 09/08/19 0832  GLUCAP 239* 174* 266* 198* 176*    Cc time   Dirk Dress, NP Pulmonary/Critical Care Medicine  09/08/2019  9:45 AM

## 2019-09-09 DIAGNOSIS — Z9911 Dependence on respirator [ventilator] status: Secondary | ICD-10-CM

## 2019-09-09 DIAGNOSIS — J15211 Pneumonia due to Methicillin susceptible Staphylococcus aureus: Secondary | ICD-10-CM

## 2019-09-09 LAB — COMPREHENSIVE METABOLIC PANEL
ALT: 83 U/L — ABNORMAL HIGH (ref 0–44)
AST: 96 U/L — ABNORMAL HIGH (ref 15–41)
Albumin: 2 g/dL — ABNORMAL LOW (ref 3.5–5.0)
Alkaline Phosphatase: 78 U/L (ref 38–126)
Anion gap: 12 (ref 5–15)
BUN: 48 mg/dL — ABNORMAL HIGH (ref 6–20)
CO2: 28 mmol/L (ref 22–32)
Calcium: 9.1 mg/dL (ref 8.9–10.3)
Chloride: 110 mmol/L (ref 98–111)
Creatinine, Ser: 1.98 mg/dL — ABNORMAL HIGH (ref 0.61–1.24)
GFR calc Af Amer: 47 mL/min — ABNORMAL LOW (ref >60)
GFR calc non Af Amer: 41 mL/min — ABNORMAL LOW (ref >60)
Glucose, Bld: 167 mg/dL — ABNORMAL HIGH (ref 70–99)
Potassium: 3.3 mmol/L — ABNORMAL LOW (ref 3.5–5.1)
Sodium: 150 mmol/L — ABNORMAL HIGH (ref 135–145)
Total Bilirubin: 0.6 mg/dL (ref 0.3–1.2)
Total Protein: 6.1 g/dL — ABNORMAL LOW (ref 6.5–8.1)

## 2019-09-09 LAB — CBC
HCT: 36.4 % — ABNORMAL LOW (ref 39.0–52.0)
Hemoglobin: 10.6 g/dL — ABNORMAL LOW (ref 13.0–17.0)
MCH: 24.5 pg — ABNORMAL LOW (ref 26.0–34.0)
MCHC: 29.1 g/dL — ABNORMAL LOW (ref 30.0–36.0)
MCV: 84.3 fL (ref 80.0–100.0)
Platelets: 267 10*3/uL (ref 150–400)
RBC: 4.32 MIL/uL (ref 4.22–5.81)
RDW: 16.7 % — ABNORMAL HIGH (ref 11.5–15.5)
WBC: 12.6 10*3/uL — ABNORMAL HIGH (ref 4.0–10.5)
nRBC: 0 % (ref 0.0–0.2)

## 2019-09-09 LAB — GLUCOSE, CAPILLARY
Glucose-Capillary: 123 mg/dL — ABNORMAL HIGH (ref 70–99)
Glucose-Capillary: 124 mg/dL — ABNORMAL HIGH (ref 70–99)
Glucose-Capillary: 148 mg/dL — ABNORMAL HIGH (ref 70–99)
Glucose-Capillary: 189 mg/dL — ABNORMAL HIGH (ref 70–99)
Glucose-Capillary: 189 mg/dL — ABNORMAL HIGH (ref 70–99)
Glucose-Capillary: 194 mg/dL — ABNORMAL HIGH (ref 70–99)

## 2019-09-09 LAB — MAGNESIUM: Magnesium: 2.1 mg/dL (ref 1.7–2.4)

## 2019-09-09 LAB — PHOSPHORUS: Phosphorus: 4 mg/dL (ref 2.5–4.6)

## 2019-09-09 MED ORDER — SENNA 8.6 MG PO TABS
2.0000 | ORAL_TABLET | Freq: Two times a day (BID) | ORAL | Status: DC
  Administered 2019-09-09: 17.2 mg
  Filled 2019-09-09 (×2): qty 2

## 2019-09-09 MED ORDER — FLEET ENEMA 7-19 GM/118ML RE ENEM: 1.0000 | ENEMA | Freq: Once | RECTAL | Status: DC

## 2019-09-09 MED ORDER — LABETALOL HCL 5 MG/ML IV SOLN
5.0000 mg | INTRAVENOUS | Status: DC | PRN
  Administered 2019-09-10 – 2019-09-11 (×8): 5 mg via INTRAVENOUS
  Filled 2019-09-09 (×6): qty 4

## 2019-09-09 MED ORDER — LACTULOSE 10 GM/15ML PO SOLN
30.0000 g | Freq: Three times a day (TID) | ORAL | Status: DC
  Administered 2019-09-09: 30 g via ORAL
  Filled 2019-09-09: qty 45

## 2019-09-09 MED ORDER — POTASSIUM CHLORIDE 20 MEQ PO PACK
40.0000 meq | PACK | Freq: Once | ORAL | Status: AC
  Administered 2019-09-09: 40 meq
  Filled 2019-09-09: qty 2

## 2019-09-09 MED ORDER — INSULIN ASPART 100 UNIT/ML ~~LOC~~ SOLN
4.0000 [IU] | SUBCUTANEOUS | Status: DC
  Administered 2019-09-09 – 2019-09-10 (×4): 4 [IU] via SUBCUTANEOUS

## 2019-09-09 MED ORDER — LACTULOSE 10 GM/15ML PO SOLN
30.0000 g | Freq: Three times a day (TID) | ORAL | Status: AC
  Administered 2019-09-09 (×2): 30 g
  Filled 2019-09-09: qty 45

## 2019-09-09 MED ORDER — FREE WATER
300.0000 mL | Status: DC
  Administered 2019-09-09 (×3): 300 mL

## 2019-09-09 MED ORDER — POLYETHYLENE GLYCOL 3350 17 G PO PACK
17.0000 g | PACK | Freq: Two times a day (BID) | ORAL | Status: DC
  Administered 2019-09-09: 17 g
  Filled 2019-09-09: qty 1

## 2019-09-09 MED ORDER — INSULIN DETEMIR 100 UNIT/ML ~~LOC~~ SOLN
26.0000 [IU] | Freq: Two times a day (BID) | SUBCUTANEOUS | Status: DC
  Administered 2019-09-09 (×2): 26 [IU] via SUBCUTANEOUS
  Filled 2019-09-09 (×5): qty 0.26

## 2019-09-09 NOTE — Progress Notes (Addendum)
NAME:  Micheal Levy, MRN:  [id number], DOB:  1980/07/22, LOS: 5 ADMISSION DATE:  09/04/2019, CONSULTATION DATE:  09/04/2019 REFERRING MD: Rancour , CHIEF COMPLAINT:  Seizures/ Hyperglycemia  Brief History   Micheal Levy is a 39 y.o. male with a history of substance abuse, DM, HTN , seizures in the setting of hyperglycemia, and previous stroke.who presented to the ED 5/30 early am with seizures, hyperglycemia and HTN. CT and MRI Head were negative for stroke. CTA was negative for PE.  He was intubated for airway protection. PCCM were asked to admit and manage care.   Past Medical History  CKD, T2DM, Seizures, HTN  Significant Hospital Events   5/30: Admission 6/1 LFTs up, resp culture + staph 6/2 LFTs improving 6/3 still unable to wean due to tachypnea; CXR improving 6/4 febrile overnight  Consults:  5/30 Neuro >> Code Stroke RO 5/30 PCCM Procedures:  ETT 5/30>>  Significant Diagnostic Tests:  5/30 CTA Head / Cerebral PerfusionNo emergent large vessel occlusion or high-grade stenosis.  Areas of calculated ischemia are likely artifactual.  09/04/2019 MRI Head No evidence of recent infarction, hemorrhage, or mass. Mild cerebral volume loss and chronic microvascular ischemic changes  5/31 EEG> no epileptiform activity; severe diffuse encephalopathy  09/04/2019 CTA Chest No pulmonary embolism. Dilated main pulmonary artery, suggesting pulmonary arterial hypertension. Mild cardiomegaly. Mosaic attenuation throughout both lungs, nonspecific, probably due to mosaic perfusion from pulmonary vascular disease. Right pulmonary nodules, largest 3 mm. No follow-up needed if patient is low-risk  6/1 CXR> progressive pulm edema with cardiomegaly  Micro Data:  5/30: SARS COVID 2 >> Negative 5/31 BC x2>>NGTD 6/1 resp culture>>+MSSA  Antimicrobials:  Rocephin 5/31>>6/1 Vanc 6/1>>6/2 Cefazolin 6/2>>  Interim history/subjective:  No significant overnight events. Febrile to Tmax  101.48F  Objective   Blood pressure (!) 145/103, pulse 69, temperature 99.9 F (37.7 C), temperature source Axillary, resp. rate 14, height 6\' 1"  (1.854 m), weight 110 kg, SpO2 98 %.    Vent Mode: PRVC FiO2 (%):  [40 %] 40 % Set Rate:  [14 bmp] 14 bmp Vt Set:  [630 mL] 630 mL PEEP:  [5 cmH20] 5 cmH20 Pressure Support:  [12 cmH20] 12 cmH20 Plateau Pressure:  [24 cmH20-27 cmH20] 24 cmH20   Intake/Output Summary (Last 24 hours) at 09/09/2019 0740 Last data filed at 09/09/2019 0400 Gross per 24 hour  Intake 2116.3 ml  Output 1425 ml  Net 691.3 ml   Filed Weights   09/04/19 0453 09/08/19 0419 09/09/19 0457  Weight: 107.7 kg 113.2 kg 110 kg    Examination:  General: young male, NAD on vent  HENT: ETT PULM: lungs clear CV: RRR. +1 edema to LE GI: abd distended and firm. bs active. nontender to palpation.  MSK: normal bulk and tone Neuro: awakens to voice. Moving all extremities. Lifts head off bed. Follows commands.   Resolved Hospital Problem list     Assessment & Plan:   Seizure. Etiology remains unclear. History of seizures in setting of hyperglycemia/hypertension. Glucose ~600, SBPs in the 200s on admission. No findings on head imaging to explain seizure. UDS negative. No epileptiform activity on LTM No further seizures since admission Metabolic encephalopathy. Seems resolved. Plan Management per neuro Continue keppra - dose adjustments per pharmacy  Seizure precautions  Acute hypercapnic respiratory failure--intubated in ED for airway protection.  Continuing to fail SBTs due to becoming tachypneic. Low dose klonapin added 6/3 to assist with weaning. Still becoming tachypneic however is pulling decent volumes ~350s--RISB around 100.  Aspiration pneumonia. Resp culture +MSSA. Remains febrile but NGTD on blood cultures to suspect bacteremia. Hemodynamically stable CXR showing improvement  Plan:  Continue cefazolin--tx day #4/7 Follow blood cultures. Continue daily  WUA/SBTs. VAP bundle. Continue klonapin to assist with vent wean  Acute on chronic renal injury. Improving. Continue to monitor Hypernatremia. 4.7L free water deficit. Starting free water per tube. Hypokalemia. 39meq K per tube once. Continue to monitor.  HFpEF (LV G2DD). Severe LVH.  Up +5L from admission. Hypertension. Bps stable Plan Continue hydral, hctz, lopressor, amlodipine.   Strict I/O  Transaminitis. LFTs normal on admission. Pattern consistent with hepatotoxic process. Viral hepatitis panel negative. LFTs improving today. Plan: Continue to monitor.  T2DM uncontrolled--A1C 11.4 on admission. Glucoses ~600 on admission.  Glucoses improving but still fairly elevated Plan:  Increasing lantus 24>26U BID. If sugars remain elevated, would go up on tube feed coverage. SSI TF coverage   Constipation No BM since 5/30. abd becoming progressively distended and firm. Patient relaying some abd pain and nausea this morning. Continue colace, miralax, senna. Will give an enema today to see if we can get things moving. May also help with vent wean. Will also need to consider obtaining further imaging to r/o illeus vs obstruction  High risk malnutrition. Continue tube feeds  Best practice:  Diet: NPO Pain/Anxiety/Delirium protocol (if indicated): none VAP protocol (if indicated): Initiated DVT prophylaxis: heparin subcut, SCDs GI prophylaxis: Protonix Glucose control: as above Mobility: BR Code Status: Full Family Communication: will update later today Disposition: ICU  Labs   CBC: Recent Labs  Lab 09/04/19 0506 09/04/19 0512 09/04/19 9604 09/05/19 0122 09/05/19 0305 09/07/19 0551 09/08/19 0628  WBC 13.0*  --   --  9.9  --  16.6* 13.8*  NEUTROABS 7.5  --   --   --   --   --   --   HGB 14.1   < > 13.9 11.5* 11.9* 12.1* 11.5*  HCT 44.3   < > 41.0 36.8* 35.0* 39.7 38.2*  MCV 78.5*  --   --  80.3  --  82.0 81.8  PLT 322  --   --  213  --  200 219   < > = values in this  interval not displayed.    Basic Metabolic Panel: Recent Labs  Lab 09/04/19 0829 09/04/19 5409 09/04/19 2104 09/04/19 2104 09/05/19 0122 09/05/19 0122 09/05/19 0305 09/05/19 1119 09/05/19 1657 09/06/19 0525 09/06/19 1650 09/07/19 0551 09/08/19 0628  NA  --    < > 139   < > 135  --  139  --   --  137  --  140 142  K  --    < > 3.3*   < > 3.0*   < > 2.9* 3.5  --  4.0  --  3.5 3.0*  CL  --    < > 101  --  100  --   --   --   --  103  --  108 104  CO2  --    < > 28  --  25  --   --   --   --  22  --  22 23  GLUCOSE  --    < > 111*  --  458*  --   --   --   --  287*  --  144* 221*  BUN  --    < > 18  --  17  --   --   --   --  26*  --  36* 46*  CREATININE  --    < > 2.06*  --  2.04*  --   --   --   --  2.75*  --  2.18* 2.00*  CALCIUM  --    < > 8.4*  --  7.8*  --   --   --   --  8.7*  --  8.6* 8.7*  MG 2.0  --   --   --  1.7  --   --   --  2.4 2.4 2.2  --   --   PHOS  --   --   --   --  4.3  --   --   --  4.5 5.0* 3.6  --   --    < > = values in this interval not displayed.   GFR: Estimated Creatinine Clearance: 63.2 mL/min (A) (by C-G formula based on SCr of 2 mg/dL (H)). Recent Labs  Lab 09/04/19 0506 09/05/19 0122 09/06/19 0148 09/06/19 0525 09/07/19 0551 09/08/19 0628  WBC 13.0* 9.9  --   --  16.6* 13.8*  LATICACIDVEN  --   --  2.4* 2.4*  --   --     Liver Function Tests: Recent Labs  Lab 09/04/19 0506 09/06/19 0525 09/07/19 0551 09/08/19 0628  AST 30 831* 319* 285*  ALT 21 326* 267* 248*  ALKPHOS 109 63 71 67  BILITOT 0.9 1.2 1.0 0.7  PROT 7.3 6.4* 5.7* 5.5*  ALBUMIN 3.7 2.7* 2.1* 2.0*   No results for input(s): LIPASE, AMYLASE in the last 168 hours. No results for input(s): AMMONIA in the last 168 hours.  ABG    Component Value Date/Time   PHART 7.389 09/04/2019 0833   PCO2ART 53.3 (H) 09/04/2019 0833   PO2ART 93 09/04/2019 0833   HCO3 29.1 (H) 09/05/2019 0305   TCO2 30 09/05/2019 0305   ACIDBASEDEF 0.8 03/24/2017 0330   O2SAT 98.0 09/05/2019  0305     Coagulation Profile: Recent Labs  Lab 09/04/19 0506  INR 1.0    Cardiac Enzymes: No results for input(s): CKTOTAL, CKMB, CKMBINDEX, TROPONINI in the last 168 hours.  HbA1C: Hemoglobin A1C  Date/Time Value Ref Range Status  04/19/2019 10:38 AM 7.9 (A) 4.0 - 5.6 % Final  01/04/2018 11:43 AM 8.7 (A) 4.0 - 5.6 % Final   Hgb A1c MFr Bld  Date/Time Value Ref Range Status  09/04/2019 06:22 PM 11.4 (H) 4.8 - 5.6 % Final    Comment:    (NOTE) Pre diabetes:          5.7%-6.4% Diabetes:              >6.4% Glycemic control for   <7.0% adults with diabetes   06/04/2017 11:11 AM 7.0 (H) 4.8 - 5.6 % Final    Comment:             Prediabetes: 5.7 - 6.4          Diabetes: >6.4          Glycemic control for adults with diabetes: <7.0     CBG: Recent Labs  Lab 09/08/19 1218 09/08/19 1626 09/08/19 1946 09/08/19 2333 09/09/19 0321  GLUCAP 195* 201* 156* 153* 189*    Janvi Ammar, MD 09/09/19 8:41 AM

## 2019-09-10 ENCOUNTER — Inpatient Hospital Stay (HOSPITAL_COMMUNITY): Payer: Self-pay

## 2019-09-10 DIAGNOSIS — M7989 Other specified soft tissue disorders: Secondary | ICD-10-CM

## 2019-09-10 LAB — COMPREHENSIVE METABOLIC PANEL
ALT: 54 U/L — ABNORMAL HIGH (ref 0–44)
AST: 110 U/L — ABNORMAL HIGH (ref 15–41)
Albumin: 2.3 g/dL — ABNORMAL LOW (ref 3.5–5.0)
Alkaline Phosphatase: 92 U/L (ref 38–126)
Anion gap: 15 (ref 5–15)
BUN: 57 mg/dL — ABNORMAL HIGH (ref 6–20)
CO2: 28 mmol/L (ref 22–32)
Calcium: 10 mg/dL (ref 8.9–10.3)
Chloride: 108 mmol/L (ref 98–111)
Creatinine, Ser: 2.19 mg/dL — ABNORMAL HIGH (ref 0.61–1.24)
GFR calc Af Amer: 42 mL/min — ABNORMAL LOW (ref >60)
GFR calc non Af Amer: 36 mL/min — ABNORMAL LOW (ref >60)
Glucose, Bld: 161 mg/dL — ABNORMAL HIGH (ref 70–99)
Potassium: 3.5 mmol/L (ref 3.5–5.1)
Sodium: 151 mmol/L — ABNORMAL HIGH (ref 135–145)
Total Bilirubin: 0.5 mg/dL (ref 0.3–1.2)
Total Protein: 7.3 g/dL (ref 6.5–8.1)

## 2019-09-10 LAB — CBC
HCT: 43.8 % (ref 39.0–52.0)
Hemoglobin: 12.7 g/dL — ABNORMAL LOW (ref 13.0–17.0)
MCH: 24.7 pg — ABNORMAL LOW (ref 26.0–34.0)
MCHC: 29 g/dL — ABNORMAL LOW (ref 30.0–36.0)
MCV: 85.2 fL (ref 80.0–100.0)
Platelets: 316 10*3/uL (ref 150–400)
RBC: 5.14 MIL/uL (ref 4.22–5.81)
RDW: 17.1 % — ABNORMAL HIGH (ref 11.5–15.5)
WBC: 15.3 10*3/uL — ABNORMAL HIGH (ref 4.0–10.5)
nRBC: 0 % (ref 0.0–0.2)

## 2019-09-10 LAB — GLUCOSE, CAPILLARY
Glucose-Capillary: 152 mg/dL — ABNORMAL HIGH (ref 70–99)
Glucose-Capillary: 140 mg/dL — ABNORMAL HIGH (ref 70–99)
Glucose-Capillary: 139 mg/dL — ABNORMAL HIGH (ref 70–99)
Glucose-Capillary: 273 mg/dL — ABNORMAL HIGH (ref 70–99)
Glucose-Capillary: 271 mg/dL — ABNORMAL HIGH (ref 70–99)
Glucose-Capillary: 223 mg/dL — ABNORMAL HIGH (ref 70–99)

## 2019-09-10 MED ORDER — ACETAMINOPHEN 650 MG RE SUPP
650.0000 mg | RECTAL | Status: DC | PRN
  Administered 2019-09-10: 650 mg via RECTAL
  Filled 2019-09-10: qty 1

## 2019-09-10 MED ORDER — INSULIN DETEMIR 100 UNIT/ML ~~LOC~~ SOLN
26.0000 [IU] | Freq: Two times a day (BID) | SUBCUTANEOUS | Status: DC
  Administered 2019-09-10: 26 [IU] via SUBCUTANEOUS
  Filled 2019-09-10 (×3): qty 0.26

## 2019-09-10 MED ORDER — INSULIN ASPART 100 UNIT/ML ~~LOC~~ SOLN
0.0000 [IU] | Freq: Every day | SUBCUTANEOUS | Status: DC
  Administered 2019-09-10 – 2019-09-12 (×2): 2 [IU] via SUBCUTANEOUS

## 2019-09-10 MED ORDER — HYDRALAZINE HCL 50 MG PO TABS
100.0000 mg | ORAL_TABLET | Freq: Three times a day (TID) | ORAL | Status: DC
  Administered 2019-09-10 – 2019-09-13 (×9): 100 mg via ORAL
  Filled 2019-09-10 (×8): qty 2

## 2019-09-10 MED ORDER — ASPIRIN 81 MG PO CHEW
81.0000 mg | CHEWABLE_TABLET | Freq: Every day | ORAL | Status: DC
  Administered 2019-09-10 – 2019-09-13 (×4): 81 mg via ORAL
  Filled 2019-09-10 (×3): qty 1

## 2019-09-10 MED ORDER — SODIUM CHLORIDE 0.9% FLUSH: 10.0000 mL | INTRAVENOUS | Status: DC | PRN

## 2019-09-10 MED ORDER — PANTOPRAZOLE SODIUM 40 MG PO PACK
40.0000 mg | PACK | Freq: Every day | ORAL | Status: DC
  Administered 2019-09-11: 40 mg via ORAL
  Filled 2019-09-10 (×2): qty 20

## 2019-09-10 MED ORDER — INSULIN ASPART 100 UNIT/ML ~~LOC~~ SOLN
0.0000 [IU] | Freq: Three times a day (TID) | SUBCUTANEOUS | Status: DC
  Administered 2019-09-11: 5 [IU] via SUBCUTANEOUS
  Administered 2019-09-11: 2 [IU] via SUBCUTANEOUS
  Administered 2019-09-11: 1 [IU] via SUBCUTANEOUS
  Administered 2019-09-12: 7 [IU] via SUBCUTANEOUS
  Administered 2019-09-12: 1 [IU] via SUBCUTANEOUS
  Administered 2019-09-12: 5 [IU] via SUBCUTANEOUS

## 2019-09-10 MED ORDER — AMLODIPINE BESYLATE 10 MG PO TABS
10.0000 mg | ORAL_TABLET | Freq: Every day | ORAL | Status: DC
  Administered 2019-09-10 – 2019-09-13 (×4): 10 mg via ORAL
  Filled 2019-09-10 (×2): qty 1
  Filled 2019-09-10: qty 4

## 2019-09-10 MED ORDER — RESOURCE THICKENUP CLEAR PO POWD
ORAL | Status: DC | PRN
  Filled 2019-09-10: qty 125

## 2019-09-10 MED ORDER — SODIUM CHLORIDE 0.9% FLUSH
10.0000 mL | Freq: Two times a day (BID) | INTRAVENOUS | Status: DC
  Administered 2019-09-10 – 2019-09-13 (×4): 10 mL

## 2019-09-10 MED ORDER — METOPROLOL TARTRATE 50 MG PO TABS
50.0000 mg | ORAL_TABLET | Freq: Two times a day (BID) | ORAL | Status: DC
  Administered 2019-09-10 – 2019-09-13 (×7): 50 mg via ORAL
  Filled 2019-09-10 (×7): qty 1

## 2019-09-10 MED ORDER — LEVETIRACETAM 500 MG PO TABS
1000.0000 mg | ORAL_TABLET | Freq: Two times a day (BID) | ORAL | Status: DC
  Administered 2019-09-10 – 2019-09-13 (×7): 1000 mg via ORAL
  Filled 2019-09-10 (×7): qty 2

## 2019-09-10 MED ORDER — POLYETHYLENE GLYCOL 3350 17 G PO PACK
17.0000 g | PACK | Freq: Two times a day (BID) | ORAL | Status: DC
  Administered 2019-09-11: 17 g via ORAL
  Filled 2019-09-10 (×3): qty 1

## 2019-09-10 MED ORDER — FUROSEMIDE 10 MG/ML IJ SOLN: 40.0000 mg | Freq: Once | INTRAMUSCULAR | Status: DC

## 2019-09-10 MED ORDER — HYDROCHLOROTHIAZIDE 25 MG PO TABS
25.0000 mg | ORAL_TABLET | Freq: Every day | ORAL | Status: DC
  Administered 2019-09-10: 25 mg via ORAL
  Filled 2019-09-10: qty 1

## 2019-09-10 MED ORDER — DOCUSATE SODIUM 100 MG PO CAPS
100.0000 mg | ORAL_CAPSULE | Freq: Two times a day (BID) | ORAL | Status: DC
  Administered 2019-09-11: 100 mg via ORAL
  Filled 2019-09-10: qty 1

## 2019-09-10 MED ORDER — ADULT MULTIVITAMIN W/MINERALS CH
1.0000 | ORAL_TABLET | Freq: Every day | ORAL | Status: DC
  Administered 2019-09-10 – 2019-09-13 (×4): 1 via ORAL
  Filled 2019-09-10 (×3): qty 1

## 2019-09-10 MED ORDER — SENNA 8.6 MG PO TABS: 2.0000 | ORAL_TABLET | Freq: Every day | ORAL | Status: DC

## 2019-09-10 NOTE — Progress Notes (Signed)
eLink Physician-Brief Progress Note Patient Name: Micheal Levy DOB: 03-24-1981 MRN: [id number]   Date of Service  09/10/2019  HPI/Events of Note  Notified that patient inadvertently self-extubated. On camera assessment patient maintaining sats. Appears comfortable and able to do self suctioning using [place].  eICU Interventions  Continue to monitor for respiratory compromise. No need to re-intubate at this time.     Intervention Category Major Interventions: Other:  Darl Pikes 09/10/2019, 12:24 AM

## 2019-09-10 NOTE — Progress Notes (Signed)
NAME:  Micheal Levy, MRN:  [id number], DOB:  24-Aug-1980, LOS: 6 ADMISSION DATE:  09/04/2019, CONSULTATION DATE:  09/04/2019 REFERRING MD: Rancour , CHIEF COMPLAINT:  Seizures/ Hyperglycemia  Brief History   Micheal Levy is a 39 y.o. male with a history of substance abuse, DM, HTN , seizures in the setting of hyperglycemia, and previous stroke.who presented to the ED 5/30 early am with seizures, hyperglycemia and HTN. CT and MRI Head were negative for stroke. CTA was negative for PE.  He was intubated for airway protection. PCCM were asked to admit and manage care.   Past Medical History  CKD, T2DM, Seizures, HTN  Significant Hospital Events   5/30: Admission 6/1 LFTs up, resp culture + staph 6/2 LFTs improving 6/3 still unable to wean due to tachypnea; CXR improving 6/4 febrile overnight 6/5 Self extubated about midnight  Consults:  5/30 Neuro >> Code Stroke RO 5/30 PCCM  Procedures:  ETT 5/30-6/5  Significant Diagnostic Tests:  5/30 CTA Head / Cerebral PerfusionNo emergent large vessel occlusion or high-grade stenosis.  Areas of calculated ischemia are likely artifactual.  09/04/2019 MRI Head No evidence of recent infarction, hemorrhage, or mass. Mild cerebral volume loss and chronic microvascular ischemic changes  5/31 EEG> no epileptiform activity; severe diffuse encephalopathy  09/04/2019 CTA Chest No pulmonary embolism. Dilated main pulmonary artery, suggesting pulmonary arterial hypertension. Mild cardiomegaly. Mosaic attenuation throughout both lungs, nonspecific, probably due to mosaic perfusion from pulmonary vascular disease. Right pulmonary nodules, largest 3 mm. No follow-up needed if patient is low-risk  6/1 CXR> progressive pulm edema with cardiomegaly 6/3 chest x-ray-improvement in pulmonary congestion, small right effusion  Micro Data:  5/30: SARS COVID 2 >> Negative 5/31 BC x2>>NGTD 6/1 resp culture>>+MSSA  Antimicrobials:  Rocephin 5/31>>6/1 Vanc  6/1>>6/2 Cefazolin 6/2>>  Interim history/subjective:  Self extubated about midnight  Febrile to Tmax 99.9 Complain of pain in his left upper extremity  Objective   Blood pressure (!) 148/91, pulse (!) 106, temperature 99.2 F (37.3 C), temperature source Oral, resp. rate (!) 38, height 6\' 1"  (1.854 m), weight 108.7 kg, SpO2 96 %.    Vent Mode: PRVC FiO2 (%):  [50 %] 50 % Set Rate:  [14 bmp] 14 bmp Vt Set:  [630 mL] 630 mL PEEP:  [5 cmH20] 5 cmH20 Plateau Pressure:  [25 cmH20-32 cmH20] 32 cmH20   Intake/Output Summary (Last 24 hours) at 09/10/2019 0829 Last data filed at 09/10/2019 0700 Gross per 24 hour  Intake 2850.25 ml  Output 2435 ml  Net 415.25 ml   Filed Weights   09/08/19 0419 09/09/19 0457 09/10/19 0500  Weight: 113.2 kg 110 kg 108.7 kg    Examination:  General: young male, on nasal cannula, comfortable HENT: Moist oral mucosa PULM: lungs clear, few rales at the bases posteriorly CV: RRR. +1 edema to LE GI: abd distended and soft. bs active. nontender to palpation.  MSK: normal bulk and tone, left upper extremity swelling Neuro: Moving all extremities  Resolved Hospital Problem list     Assessment & Plan:   Seizure. Etiology remains unclear. History of seizures in setting of hyperglycemia/hypertension. Glucose ~600, SBPs in the 200s on admission. No findings on head imaging to explain seizure. UDS negative. No epileptiform activity on LTM No further seizures since admission  Metabolic encephalopathy. Seems resolved. Management per neuro Continue keppra - dose adjustments per pharmacy  Seizure precautions  Acute hypercapnic respiratory failure--intubated in ED for airway protection.  Self extubated this morning   Aspiration  pneumonia. Resp culture +MSSA. Remains febrile but NGTD on blood cultures to suspect bacteremia. Hemodynamically stable CXR showing improvement  Continue cefazolin--tx day #5/7 Blood cultures negative  Acute on chronic renal  injury. Improving. Continue to monitor Hypernatremia. 4.7L free water deficit. Starting free water per tube. Hypokalemia. K per tube once. Continue to monitor.  HFpEF (LV G2DD). Severe LVH.  Up +5L from admission.   Hypertension. Bps stable Plan  Continue hydral, hctz, lopressor, amlodipine.   Strict I/O  Acute kidney injury Trend electrolytes Avoid nephrotoxic's  Transaminitis. LFTs normal on admission. Pattern consistent with hepatotoxic process. Viral hepatitis panel negative. LFTs continues to improve Plan: Continue to monitor.  T2DM uncontrolled--A1C 11.4 on admission. Glucoses ~600 on admission.  Glucoses improving but still fairly elevated Plan:  Insulin will need to be addressed as patient self extubated and not on tube feeds at present SSI  Constipation  BM 6/4  High risk malnutrition. -We will get speech to seen today for swallowing evaluation and started diet as soon as able  Best practice:  Diet: assess swallowing Pain/Anxiety/Delirium protocol (if indicated): none VAP protocol (if indicated):no longer needed DVT prophylaxis: heparin subcut, SCDs GI prophylaxis: Protonix Glucose control: as above Mobility: BR Code Status: Full Family Communication: will update Disposition: ICU  Labs   CBC: Recent Labs  Lab 09/04/19 0506 09/04/19 0512 09/05/19 0122 09/05/19 0122 09/05/19 0305 09/07/19 0551 09/08/19 0628 09/09/19 0651 09/10/19 0428  WBC 13.0*   < > 9.9  --   --  16.6* 13.8* 12.6* 15.3*  NEUTROABS 7.5  --   --   --   --   --   --   --   --   HGB 14.1   < > 11.5*   < > 11.9* 12.1* 11.5* 10.6* 12.7*  HCT 44.3   < > 36.8*   < > 35.0* 39.7 38.2* 36.4* 43.8  MCV 78.5*   < > 80.3  --   --  82.0 81.8 84.3 85.2  PLT 322   < > 213  --   --  200 219 267 316   < > = values in this interval not displayed.    Basic Metabolic Panel: Recent Labs  Lab 09/05/19 0122 09/05/19 0122 09/05/19 0305 09/05/19 1657 09/06/19 0525 09/06/19 1650  09/07/19 0551 09/08/19 0628 09/09/19 0651 09/10/19 0428  NA 135   < >   < >  --  137  --  140 142 150* 151*  K 3.0*   < >   < >  --  4.0  --  3.5 3.0* 3.3* 3.5  CL 100   < >  --   --  103  --  108 104 110 108  CO2 25   < >  --   --  22  --  22 23 28 28   GLUCOSE 458*   < >  --   --  287*  --  144* 221* 167* 161*  BUN 17   < >  --   --  26*  --  36* 46* 48* 57*  CREATININE 2.04*   < >  --   --  2.75*  --  2.18* 2.00* 1.98* 2.19*  CALCIUM 7.8*   < >  --   --  8.7*  --  8.6* 8.7* 9.1 10.0  MG 1.7  --   --  2.4 2.4 2.2  --   --  2.1  --   PHOS 4.3  --   --  4.5 5.0* 3.6  --   --  4.0  --    < > = values in this interval not displayed.   GFR: Estimated Creatinine Clearance: 57.4 mL/min (A) (by C-G formula based on SCr of 2.19 mg/dL (H)). Recent Labs  Lab 09/05/19 0122 09/06/19 0148 09/06/19 0525 09/07/19 0551 09/08/19 0628 09/09/19 0651 09/10/19 0428  WBC   < >  --   --  16.6* 13.8* 12.6* 15.3*  LATICACIDVEN  --  2.4* 2.4*  --   --   --   --    < > = values in this interval not displayed.    Liver Function Tests: Recent Labs  Lab 09/06/19 0525 09/07/19 0551 09/08/19 0628 09/09/19 0651 09/10/19 0428  AST 831* 319* 285* 96* 110*  ALT 326* 267* 248* 83* 54*  ALKPHOS 63 71 67 78 92  BILITOT 1.2 1.0 0.7 0.6 0.5  PROT 6.4* 5.7* 5.5* 6.1* 7.3  ALBUMIN 2.7* 2.1* 2.0* 2.0* 2.3*   No results for input(s): LIPASE, AMYLASE in the last 168 hours. No results for input(s): AMMONIA in the last 168 hours.  ABG    Component Value Date/Time   PHART 7.389 09/04/2019 0833   PCO2ART 53.3 (H) 09/04/2019 0833   PO2ART 93 09/04/2019 0833   HCO3 29.1 (H) 09/05/2019 0305   TCO2 30 09/05/2019 0305   ACIDBASEDEF 0.8 03/24/2017 0330   O2SAT 98.0 09/05/2019 0305     Coagulation Profile: Recent Labs  Lab 09/04/19 0506  INR 1.0    Cardiac Enzymes: No results for input(s): CKTOTAL, CKMB, CKMBINDEX, TROPONINI in the last 168 hours.  HbA1C: Hemoglobin A1C  Date/Time Value Ref Range  Status  04/19/2019 10:38 AM 7.9 (A) 4.0 - 5.6 % Final  01/04/2018 11:43 AM 8.7 (A) 4.0 - 5.6 % Final   Hgb A1c MFr Bld  Date/Time Value Ref Range Status  09/04/2019 06:22 PM 11.4 (H) 4.8 - 5.6 % Final    Comment:    (NOTE) Pre diabetes:          5.7%-6.4% Diabetes:              >6.4% Glycemic control for   <7.0% adults with diabetes   06/04/2017 11:11 AM 7.0 (H) 4.8 - 5.6 % Final    Comment:             Prediabetes: 5.7 - 6.4          Diabetes: >6.4          Glycemic control for adults with diabetes: <7.0     CBG: Recent Labs  Lab 09/09/19 1603 09/09/19 1935 09/09/19 2335 09/10/19 0337 09/10/19 0723  GLUCAP 123* 124* 194* 152* 140*   The patient is critically ill with multiple organ systems failure and requires high complexity decision making for assessment and support, frequent evaluation and titration of therapies, application of advanced monitoring technologies and extensive interpretation of multiple databases. Critical Care Time devoted to patient care services described in this note independent of APP/resident time (if applicable)  is 30 minutes.   Sherrilyn Rist MD [facility] Personal pager: [phone] If unanswered, please page CCM On-call: [phone]

## 2019-09-10 NOTE — Progress Notes (Addendum)
This RN walked into patient's room around 0010 to assess a vent alarm and vent sounds. Patient had removed ETT and OG tube. Wrist restraints were applied correctly. Precedex was titrated to ordered RASS goal. E-Link, RT, and other unit RNs immediately made aware. Oxygen, suction, and ambubag were available. Post extubation, patient was responsive, oriented, breathing on own with unlabored respirations. O2 saturation was mid 90s, with clear lung sounds. Patient currently has oxygen saturation of 97% on nasal cannula, not in any distress. Will continue to monitor.

## 2019-09-10 NOTE — Procedures (Signed)
Patient self extubated this morning.  Patient is able to cough productively, and say his name.  LS are clear but slightly diminished in LL.  Currently sat 97% on low flow oxygen via [place].

## 2019-09-10 NOTE — Progress Notes (Signed)
eLink Physician-Brief Progress Note Patient Name: Micheal Levy DOB: June 21, 1980 MRN: [id number]   Date of Service  09/10/2019  HPI/Events of Note  Request for pain reliever and if patient may takes sips. On camera assessment patient appears quite lethargic.  eICU Interventions  Will shift Tylenol to suppository     Intervention Category Intermediate Interventions: Other:  Darl Pikes 09/10/2019, 2:50 AM

## 2019-09-10 NOTE — Progress Notes (Signed)
Upper extremity venous has been completed.   Preliminary results in CV Proc.   Blanch Media 09/10/2019 11:49 AM

## 2019-09-10 NOTE — Evaluation (Signed)
Clinical/Bedside Swallow Evaluation Patient Details  Name: Micheal Levy MRN: [id number] Date of Birth: Dec 14, 1980  Today's Date: 09/10/2019 Time: SLP Start Time (ACUTE ONLY): 1126 SLP Stop Time (ACUTE ONLY): 1143 SLP Time Calculation (min) (ACUTE ONLY): 17 min  Past Medical History:  Past Medical History:  Diagnosis Date  . Chronic kidney disease   . Diabetes mellitus without complication (HCC)   . Elevated serum cholesterol 04/2019  . Elevated serum creatinine 04/2019  . History of seizures   . Hyperglycemia   . Hypertension   . Hypertensive urgency 04/2019  . Hypokalemia   . Seizures (HCC) 03/2017   x 2   . Stroke (HCC) 03/2017  . Substance abuse (HCC)   . Vitamin D deficiency 04/2019   Past Surgical History: No past surgical history on file. HPI:  Micheal Levy is a 39 y.o. male with a history of substance abuse, DM, HTN , seizures in the setting of hyperglycemia, and previous stroke, who presented to the ED 5/30 early am with seizures, hyperglycemia and HTN. CT and MRI Head were negative for stroke. CTA was negative for PE. CXR 6/3 showed improving B aeration. Pt was intubated 5/30-6/5 with self extubation.   Assessment / Plan / Recommendation Clinical Impression  Pt presents with clinical indicators of pharyngeal dysphagia in setting of VDRF following seizure activity.  Pt intubated for 5 days with self-extubation overnight.  Vocal quality is hoarse and low intensity, but pt is able to phonate.  Pt tolerated ice chip.  There was very slight, delayed throat clearing following thin liquid, which was elimiated with administration of nectar thick liquid.  Pt tolerated NTL by cup and serial straw sip with no clinical s/s of aspiration.  There was good oral clearance of puree, and mild oral residue with solid.  There were no clinical s/s of aspiraiton with puree or solid textures.    Recommend regular texture diet with nectar thick liquids.   Recommend MBSS next date to further  assess pharyngeal swallow function and determine safe, least restrictive diet.  SLP Visit Diagnosis: Dysphagia, oropharyngeal phase (R13.12)    Aspiration Risk  Mild aspiration risk    Diet Recommendation Regular;Nectar-thick liquid   Liquid Administration via: Cup;Straw Medication Administration: (As tolerated) Supervision: Staff to assist with self feeding Compensations: Minimize environmental distractions;Slow rate;Small sips/bites Postural Changes: Seated upright at 90 degrees    Other  Recommendations Oral Care Recommendations: Oral care BID   Follow up Recommendations   TBD     Frequency and Duration   TBD         Prognosis Prognosis for Safe Diet Advancement: Good      Swallow Study   General Date of Onset: 09/04/19 HPI: Micheal Levy is a 39 y.o. male with a history of substance abuse, DM, HTN , seizures in the setting of hyperglycemia, and previous stroke, who presented to the ED 5/30 early am with seizures, hyperglycemia and HTN. CT and MRI Head were negative for stroke. CTA was negative for PE. CXR 6/3 showed improving B aeration. Pt was intubated 5/30-6/5 with self extubation. Type of Study: Bedside Swallow Evaluation Previous Swallow Assessment: none Diet Prior to this Study: NPO Temperature Spikes Noted: No Respiratory Status: Nasal cannula History of Recent Intubation: Yes Length of Intubations (days): 5 days Date extubated: 09/10/19 Behavior/Cognition: Alert;Cooperative;Pleasant mood Oral Cavity Assessment: Within Functional Limits Oral Care Completed by SLP: No Oral Cavity - Dentition: Adequate natural dentition Self-Feeding Abilities: Needs assist Patient Positioning: Upright in bed  Baseline Vocal Quality: Hoarse;Low vocal intensity Volitional Cough: Strong Volitional Swallow: Able to elicit    Oral/Motor/Sensory Function Overall Oral Motor/Sensory Function: Within functional limits Facial ROM: Within Functional Limits Facial Symmetry: Within  Functional Limits Lingual ROM: Within Functional Limits Lingual Symmetry: Within Functional Limits Lingual Strength: Within Functional Limits Velum: Within Functional Limits Mandible: Within Functional Limits   Ice Chips Ice chips: Within functional limits Presentation: Spoon   Thin Liquid Thin Liquid: Impaired Presentation: Cup;Straw Pharyngeal  Phase Impairments: Throat Clearing - Delayed    Nectar Thick Presentation: Cup;Straw   Honey Thick   Not tested  Puree Puree: Within functional limits Presentation: Spoon   Solid     Solid: Impaired Presentation: (SLP fed) Oral Phase Functional Implications: Oral residue      Celedonio Savage, MA, [place] Office: [phone]  09/10/2019,11:55 AM

## 2019-09-11 DIAGNOSIS — E109 Type 1 diabetes mellitus without complications: Secondary | ICD-10-CM

## 2019-09-11 LAB — COMPREHENSIVE METABOLIC PANEL
ALT: 55 U/L — ABNORMAL HIGH (ref 0–44)
AST: 126 U/L — ABNORMAL HIGH (ref 15–41)
Albumin: 2.3 g/dL — ABNORMAL LOW (ref 3.5–5.0)
Alkaline Phosphatase: 107 U/L (ref 38–126)
Anion gap: 15 (ref 5–15)
BUN: 40 mg/dL — ABNORMAL HIGH (ref 6–20)
CO2: 29 mmol/L (ref 22–32)
Calcium: 10 mg/dL (ref 8.9–10.3)
Chloride: 110 mmol/L (ref 98–111)
Creatinine, Ser: 1.63 mg/dL — ABNORMAL HIGH (ref 0.61–1.24)
GFR calc Af Amer: 60 mL/min — ABNORMAL LOW (ref >60)
GFR calc non Af Amer: 52 mL/min — ABNORMAL LOW (ref >60)
Glucose, Bld: 128 mg/dL — ABNORMAL HIGH (ref 70–99)
Potassium: 3.4 mmol/L — ABNORMAL LOW (ref 3.5–5.1)
Sodium: 154 mmol/L — ABNORMAL HIGH (ref 135–145)
Total Bilirubin: 0.6 mg/dL (ref 0.3–1.2)
Total Protein: 6.8 g/dL (ref 6.5–8.1)

## 2019-09-11 LAB — CBC
HCT: 38.9 % — ABNORMAL LOW (ref 39.0–52.0)
Hemoglobin: 11 g/dL — ABNORMAL LOW (ref 13.0–17.0)
MCH: 24.3 pg — ABNORMAL LOW (ref 26.0–34.0)
MCHC: 28.3 g/dL — ABNORMAL LOW (ref 30.0–36.0)
MCV: 86.1 fL (ref 80.0–100.0)
Platelets: 344 10*3/uL (ref 150–400)
RBC: 4.52 MIL/uL (ref 4.22–5.81)
RDW: 17.1 % — ABNORMAL HIGH (ref 11.5–15.5)
WBC: 14.3 10*3/uL — ABNORMAL HIGH (ref 4.0–10.5)
nRBC: 0 % (ref 0.0–0.2)

## 2019-09-11 LAB — CULTURE, BLOOD (ROUTINE X 2)
Culture: NO GROWTH
Culture: NO GROWTH
Special Requests: ADEQUATE

## 2019-09-11 LAB — GLUCOSE, CAPILLARY
Glucose-Capillary: 130 mg/dL — ABNORMAL HIGH (ref 70–99)
Glucose-Capillary: 167 mg/dL — ABNORMAL HIGH (ref 70–99)
Glucose-Capillary: 202 mg/dL — ABNORMAL HIGH (ref 70–99)
Glucose-Capillary: 252 mg/dL — ABNORMAL HIGH (ref 70–99)

## 2019-09-11 MED ORDER — HYDROCHLOROTHIAZIDE 25 MG PO TABS
25.0000 mg | ORAL_TABLET | Freq: Every day | ORAL | Status: DC
  Administered 2019-09-11: 25 mg via ORAL
  Filled 2019-09-11: qty 1

## 2019-09-11 MED ORDER — FLUTICASONE PROPIONATE 50 MCG/ACT NA SUSP
2.0000 | Freq: Every day | NASAL | Status: DC
  Administered 2019-09-11 – 2019-09-13 (×3): 2 via NASAL
  Filled 2019-09-11: qty 16

## 2019-09-11 MED ORDER — DEXTROSE 5 % IV SOLN: INTRAVENOUS | Status: AC

## 2019-09-11 MED ORDER — ORAL CARE MOUTH RINSE
15.0000 mL | Freq: Two times a day (BID) | OROMUCOSAL | Status: DC
  Administered 2019-09-11 – 2019-09-12 (×2): 15 mL via OROMUCOSAL

## 2019-09-11 MED ORDER — PANTOPRAZOLE SODIUM 40 MG PO TBEC
40.0000 mg | DELAYED_RELEASE_TABLET | Freq: Every day | ORAL | Status: DC
  Administered 2019-09-12 – 2019-09-13 (×2): 40 mg via ORAL
  Filled 2019-09-11 (×2): qty 1

## 2019-09-11 MED ORDER — HYDROCHLOROTHIAZIDE 50 MG PO TABS: 50.0000 mg | ORAL_TABLET | Freq: Every day | ORAL | Status: DC

## 2019-09-11 MED ORDER — POTASSIUM CHLORIDE CRYS ER 20 MEQ PO TBCR
40.0000 meq | EXTENDED_RELEASE_TABLET | Freq: Once | ORAL | Status: AC
  Administered 2019-09-11: 40 meq via ORAL
  Filled 2019-09-11: qty 2

## 2019-09-11 MED ORDER — INSULIN DETEMIR 100 UNIT/ML ~~LOC~~ SOLN
15.0000 [IU] | Freq: Two times a day (BID) | SUBCUTANEOUS | Status: DC
  Administered 2019-09-11 – 2019-09-13 (×5): 15 [IU] via SUBCUTANEOUS
  Filled 2019-09-11 (×7): qty 0.15

## 2019-09-11 MED ORDER — ISOSORBIDE MONONITRATE ER 30 MG PO TB24
30.0000 mg | ORAL_TABLET | Freq: Every day | ORAL | Status: DC
  Administered 2019-09-11 – 2019-09-13 (×3): 30 mg via ORAL
  Filled 2019-09-11 (×3): qty 1

## 2019-09-11 NOTE — Progress Notes (Addendum)
NAME:  Micheal Levy, MRN:  [id number], DOB:  1980-11-12, LOS: 7 ADMISSION DATE:  09/04/2019, CONSULTATION DATE:  09/04/2019 REFERRING MD: Rancour , CHIEF COMPLAINT:  Seizures/ Hyperglycemia  Brief History   Micheal Levy is a 39 y.o. male with a history of substance abuse, DM, HTN , seizures in the setting of hyperglycemia, and previous stroke.who presented to the ED 5/30 early am with seizures, hyperglycemia and HTN. CT and MRI Head were negative for stroke. CTA was negative for PE.  He was intubated for airway protection. PCCM were asked to admit and manage care.   Past Medical History  CKD, T2DM, Seizures, HTN  Significant Hospital Events   5/30: Admission 6/1 LFTs up, resp culture + staph 6/2 LFTs improving 6/3 still unable to wean due to tachypnea; CXR improving 6/4 febrile overnight 6/5 Self extubated about midnight  Consults:  5/30 Neuro >> Code Stroke RO 5/30 PCCM  Procedures:  ETT 5/30-6/5  Significant Diagnostic Tests:  5/30 CTA Head / Cerebral PerfusionNo emergent large vessel occlusion or high-grade stenosis.  Areas of calculated ischemia are likely artifactual.  09/04/2019 MRI Head No evidence of recent infarction, hemorrhage, or mass. Mild cerebral volume loss and chronic microvascular ischemic changes  5/31 EEG> no epileptiform activity; severe diffuse encephalopathy  09/04/2019 CTA Chest No pulmonary embolism. Dilated main pulmonary artery, suggesting pulmonary arterial hypertension. Mild cardiomegaly. Mosaic attenuation throughout both lungs, nonspecific, probably due to mosaic perfusion from pulmonary vascular disease. Right pulmonary nodules, largest 3 mm. No follow-up needed if patient is low-risk  6/1 CXR> progressive pulm edema with cardiomegaly 6/3 chest x-ray-improvement in pulmonary congestion, small right effusion  Micro Data:  5/30: SARS COVID 2 >> Negative 5/31 BC x2>>NGTD 6/1 resp culture>>+MSSA  Antimicrobials:  Rocephin 5/31>>6/1 Vanc  6/1>>6/2 Cefazolin 6/2>>  Interim history/subjective:  Self extubated about midnight 6/5 Febrile to Tmax 100.6   Objective   Blood pressure (!) 192/106, pulse (!) 35, temperature 98.3 F (36.8 C), temperature source Axillary, resp. rate (!) 31, height 6\' 1"  (1.854 m), weight 108.5 kg, SpO2 94 %.        Intake/Output Summary (Last 24 hours) at 09/11/2019 0853 Last data filed at 09/11/2019 0200 Gross per 24 hour  Intake 107.8 ml  Output 2251 ml  Net -2143.2 ml   Filed Weights   09/09/19 0457 09/10/19 0500 09/11/19 0500  Weight: 110 kg 108.7 kg 108.5 kg    Examination:  General: Young gentleman, does appear comfortable HENT: Moist oral mucosa PULM: Few rales at the bases CV: Lower extremity edema, S1-S2 appreciated GI: abd distended and soft. bs active. nontender to palpation.  MSK: normal bulk and tone, left upper extremity swelling-improving Neuro: Moving all extremities  Resolved Hospital Problem list     Assessment & Plan:   Seizure. Etiology remains unclear. History of seizures in setting of hyperglycemia/hypertension. Glucose ~600, SBPs in the 200s on admission. No findings on head imaging to explain seizure. UDS negative. No epileptiform activity on LTM No further seizures since admission  Remains stable on Keppra  Metabolic encephalopathy. Seems resolved. Management per neuro Continue keppra - dose adjustments per pharmacy  Seizure precautions  Acute hypercapnic respiratory failure--intubated in ED for airway protection.  Self extubated 6/5 Continues on oxygen supplementation We will wean as tolerated   Aspiration pneumonia. Resp culture +MSSA. Remains febrile but NGTD on blood cultures to suspect bacteremia. Hemodynamically stable CXR showing improvement  Continue cefazolin--tx day #6/7-cefazolin to complete 6/7 Blood cultures negative  Acute on chronic  renal injury. Improving. Continue to monitor Hypernatremia.  Encourage oral fluid intake Will give  him D5 water, monitor sugars Hypokalemia.  Being replaced  HFpEF (LV G2DD). Severe LVH.  Up +5L from admission.   Hypertension. Bps stable Plan  Continue hydral, lopressor, amlodipine.   Hydrochlorothiazide 25 Imdur added at 30 Strict I/O  Acute kidney injury Trend electrolytes Avoid nephrotoxic's Electrolytes trending better  Transaminitis. LFTs normal on admission. Pattern consistent with hepatotoxic process. Viral hepatitis panel negative. LFTs continues to improve Plan: Continue to monitor.  T2DM uncontrolled--A1C 11.4 on admission. Glucoses ~600 on admission.  Glucoses improving but still fairly elevated Insulin Levemir decreased to 15 units twice daily as patient is not fully back on full meals at present Will need adjusted Continue SSI  Constipation  BM 6/4  High risk malnutrition. -For barium swallow today -Advance diet as tolerated Best practice:  Diet: assess swallowing Pain/Anxiety/Delirium protocol (if indicated): none VAP protocol (if indicated):no longer needed DVT prophylaxis: heparin subcut, SCDs GI prophylaxis: Protonix Glucose control: as above Mobility: BR Code Status: Full Family Communication: will update Disposition: ICU-we will transfer to MedSurg today  Labs   CBC: Recent Labs  Lab 09/07/19 0551 09/08/19 0628 09/09/19 0651 09/10/19 0428 09/11/19 0358  WBC 16.6* 13.8* 12.6* 15.3* 14.3*  HGB 12.1* 11.5* 10.6* 12.7* 11.0*  HCT 39.7 38.2* 36.4* 43.8 38.9*  MCV 82.0 81.8 84.3 85.2 86.1  PLT 200 219 267 316 344    Basic Metabolic Panel: Recent Labs  Lab 09/05/19 0122 09/05/19 0122 09/05/19 0305 09/05/19 1657 09/06/19 0525 09/06/19 0525 09/06/19 1650 09/07/19 0551 09/08/19 0628 09/09/19 0651 09/10/19 0428 09/11/19 0358  NA 135   < >   < >  --  137   < >  --  140 142 150* 151* 154*  K 3.0*   < >   < >  --  4.0   < >  --  3.5 3.0* 3.3* 3.5 3.4*  CL 100   < >  --   --  103   < >  --  108 104 110 108 110  CO2 25   < >  --    --  22   < >  --  22 23 28 28 29   GLUCOSE 458*   < >  --   --  287*   < >  --  144* 221* 167* 161* 128*  BUN 17   < >  --   --  26*   < >  --  36* 46* 48* 57* 40*  CREATININE 2.04*   < >  --   --  2.75*   < >  --  2.18* 2.00* 1.98* 2.19* 1.63*  CALCIUM 7.8*   < >  --   --  8.7*   < >  --  8.6* 8.7* 9.1 10.0 10.0  MG 1.7  --   --  2.4 2.4  --  2.2  --   --  2.1  --   --   PHOS 4.3  --   --  4.5 5.0*  --  3.6  --   --  4.0  --   --    < > = values in this interval not displayed.   GFR: Estimated Creatinine Clearance: 77 mL/min (A) (by C-G formula based on SCr of 1.63 mg/dL (H)). Recent Labs  Lab 09/06/19 0148 09/06/19 0525 09/07/19 0551 09/08/19 11/08/19 09/09/19 0651 09/10/19 0428 09/11/19 0358  WBC  --   --    < >  13.8* 12.6* 15.3* 14.3*  LATICACIDVEN 2.4* 2.4*  --   --   --   --   --    < > = values in this interval not displayed.    Liver Function Tests: Recent Labs  Lab 09/07/19 0551 09/08/19 0628 09/09/19 0651 09/10/19 0428 09/11/19 0358  AST 319* 285* 96* 110* 126*  ALT 267* 248* 83* 54* 55*  ALKPHOS 71 67 78 92 107  BILITOT 1.0 0.7 0.6 0.5 0.6  PROT 5.7* 5.5* 6.1* 7.3 6.8  ALBUMIN 2.1* 2.0* 2.0* 2.3* 2.3*   No results for input(s): LIPASE, AMYLASE in the last 168 hours. No results for input(s): AMMONIA in the last 168 hours.  ABG    Component Value Date/Time   PHART 7.389 09/04/2019 0833   PCO2ART 53.3 (H) 09/04/2019 0833   PO2ART 93 09/04/2019 0833   HCO3 29.1 (H) 09/05/2019 0305   TCO2 30 09/05/2019 0305   ACIDBASEDEF 0.8 03/24/2017 0330   O2SAT 98.0 09/05/2019 0305     Coagulation Profile: No results for input(s): INR, PROTIME in the last 168 hours.  Cardiac Enzymes: No results for input(s): CKTOTAL, CKMB, CKMBINDEX, TROPONINI in the last 168 hours.  HbA1C: Hemoglobin A1C  Date/Time Value Ref Range Status  04/19/2019 10:38 AM 7.9 (A) 4.0 - 5.6 % Final  01/04/2018 11:43 AM 8.7 (A) 4.0 - 5.6 % Final   Hgb A1c MFr Bld  Date/Time Value Ref Range  Status  09/04/2019 06:22 PM 11.4 (H) 4.8 - 5.6 % Final    Comment:    (NOTE) Pre diabetes:          5.7%-6.4% Diabetes:              >6.4% Glycemic control for   <7.0% adults with diabetes   06/04/2017 11:11 AM 7.0 (H) 4.8 - 5.6 % Final    Comment:             Prediabetes: 5.7 - 6.4          Diabetes: >6.4          Glycemic control for adults with diabetes: <7.0     CBG: Recent Labs  Lab 09/10/19 1135 09/10/19 1550 09/10/19 1949 09/10/19 2205 09/11/19 0722  GLUCAP 139* 273* 271* 223* 130*   The patient is critically ill with multiple organ systems failure and requires high complexity decision making for assessment and support, frequent evaluation and titration of therapies, application of advanced monitoring technologies and extensive interpretation of multiple databases. Critical Care Time devoted to patient care services described in this note independent of APP/resident time (if applicable)  is 32 minutes.   Virl Diamond MD [facility] Personal pager: [phone] If unanswered, please page CCM On-call: #[phone]

## 2019-09-11 NOTE — Progress Notes (Signed)
K+ 3.4- Replaced per elink protocol with Potassium PO, conversation did take place between elink and bedside RN to confirm PO route

## 2019-09-12 ENCOUNTER — Inpatient Hospital Stay (HOSPITAL_COMMUNITY): Payer: Self-pay

## 2019-09-12 DIAGNOSIS — J9602 Acute respiratory failure with hypercapnia: Secondary | ICD-10-CM

## 2019-09-12 DIAGNOSIS — I639 Cerebral infarction, unspecified: Secondary | ICD-10-CM

## 2019-09-12 LAB — GLUCOSE, CAPILLARY
Glucose-Capillary: 132 mg/dL — ABNORMAL HIGH (ref 70–99)
Glucose-Capillary: 284 mg/dL — ABNORMAL HIGH (ref 70–99)
Glucose-Capillary: 317 mg/dL — ABNORMAL HIGH (ref 70–99)
Glucose-Capillary: 227 mg/dL — ABNORMAL HIGH (ref 70–99)

## 2019-09-12 LAB — BASIC METABOLIC PANEL
Anion gap: 9 (ref 5–15)
Anion gap: 11 (ref 5–15)
Anion gap: 12 (ref 5–15)
BUN: 34 mg/dL — ABNORMAL HIGH (ref 6–20)
BUN: 33 mg/dL — ABNORMAL HIGH (ref 6–20)
BUN: 31 mg/dL — ABNORMAL HIGH (ref 6–20)
CO2: 33 mmol/L — ABNORMAL HIGH (ref 22–32)
CO2: 28 mmol/L (ref 22–32)
CO2: 28 mmol/L (ref 22–32)
Calcium: 9.6 mg/dL (ref 8.9–10.3)
Calcium: 9.5 mg/dL (ref 8.9–10.3)
Calcium: 9.3 mg/dL (ref 8.9–10.3)
Chloride: 110 mmol/L (ref 98–111)
Chloride: 106 mmol/L (ref 98–111)
Chloride: 106 mmol/L (ref 98–111)
Creatinine, Ser: 1.67 mg/dL — ABNORMAL HIGH (ref 0.61–1.24)
Creatinine, Ser: 1.75 mg/dL — ABNORMAL HIGH (ref 0.61–1.24)
Creatinine, Ser: 1.7 mg/dL — ABNORMAL HIGH (ref 0.61–1.24)
GFR calc Af Amer: 58 mL/min — ABNORMAL LOW (ref >60)
GFR calc Af Amer: 55 mL/min — ABNORMAL LOW (ref >60)
GFR calc Af Amer: 57 mL/min — ABNORMAL LOW (ref >60)
GFR calc non Af Amer: 50 mL/min — ABNORMAL LOW (ref >60)
GFR calc non Af Amer: 47 mL/min — ABNORMAL LOW (ref >60)
GFR calc non Af Amer: 49 mL/min — ABNORMAL LOW (ref >60)
Glucose, Bld: 160 mg/dL — ABNORMAL HIGH (ref 70–99)
Glucose, Bld: 386 mg/dL — ABNORMAL HIGH (ref 70–99)
Glucose, Bld: 235 mg/dL — ABNORMAL HIGH (ref 70–99)
Potassium: 3.3 mmol/L — ABNORMAL LOW (ref 3.5–5.1)
Potassium: 3.5 mmol/L (ref 3.5–5.1)
Potassium: 3.3 mmol/L — ABNORMAL LOW (ref 3.5–5.1)
Sodium: 152 mmol/L — ABNORMAL HIGH (ref 135–145)
Sodium: 145 mmol/L (ref 135–145)
Sodium: 146 mmol/L — ABNORMAL HIGH (ref 135–145)

## 2019-09-12 LAB — CBC WITH DIFFERENTIAL/PLATELET
Abs Immature Granulocytes: 0.22 10*3/uL — ABNORMAL HIGH (ref 0.00–0.07)
Basophils Absolute: 0.1 10*3/uL (ref 0.0–0.1)
Basophils Relative: 1 %
Eosinophils Absolute: 0.5 10*3/uL (ref 0.0–0.5)
Eosinophils Relative: 4 %
HCT: 37.6 % — ABNORMAL LOW (ref 39.0–52.0)
Hemoglobin: 10.7 g/dL — ABNORMAL LOW (ref 13.0–17.0)
Immature Granulocytes: 2 %
Lymphocytes Relative: 17 %
Lymphs Abs: 2.1 10*3/uL (ref 0.7–4.0)
MCH: 24.3 pg — ABNORMAL LOW (ref 26.0–34.0)
MCHC: 28.5 g/dL — ABNORMAL LOW (ref 30.0–36.0)
MCV: 85.3 fL (ref 80.0–100.0)
Monocytes Absolute: 1.3 10*3/uL — ABNORMAL HIGH (ref 0.1–1.0)
Monocytes Relative: 11 %
Neutro Abs: 8.1 10*3/uL — ABNORMAL HIGH (ref 1.7–7.7)
Neutrophils Relative %: 65 %
Platelets: 433 10*3/uL — ABNORMAL HIGH (ref 150–400)
RBC: 4.41 MIL/uL (ref 4.22–5.81)
RDW: 17.2 % — ABNORMAL HIGH (ref 11.5–15.5)
WBC: 12.4 10*3/uL — ABNORMAL HIGH (ref 4.0–10.5)
nRBC: 0.3 % — ABNORMAL HIGH (ref 0.0–0.2)

## 2019-09-12 MED ORDER — DEXTROSE 5 % IV SOLN: INTRAVENOUS | Status: DC

## 2019-09-12 MED ORDER — GLUCERNA SHAKE PO LIQD
237.0000 mL | Freq: Three times a day (TID) | ORAL | Status: DC
  Administered 2019-09-12 – 2019-09-13 (×2): 237 mL via ORAL

## 2019-09-12 MED ORDER — SENNOSIDES-DOCUSATE SODIUM 8.6-50 MG PO TABS
1.0000 | ORAL_TABLET | Freq: Two times a day (BID) | ORAL | Status: DC
  Administered 2019-09-12 – 2019-09-13 (×2): 1 via ORAL
  Filled 2019-09-12 (×3): qty 1

## 2019-09-12 MED ORDER — FUROSEMIDE 10 MG/ML IJ SOLN
20.0000 mg | Freq: Once | INTRAMUSCULAR | Status: AC
  Administered 2019-09-12: 20 mg via INTRAVENOUS
  Filled 2019-09-12: qty 2

## 2019-09-12 MED ORDER — POTASSIUM CHLORIDE CRYS ER 20 MEQ PO TBCR
40.0000 meq | EXTENDED_RELEASE_TABLET | Freq: Once | ORAL | Status: AC
  Administered 2019-09-12: 40 meq via ORAL
  Filled 2019-09-12: qty 2

## 2019-09-12 NOTE — Progress Notes (Signed)
Modified Barium Swallow Progress Note  Patient Details  Name: HYDER DEMAN MRN: [id number] Date of Birth: 1980/08/10  Today's Date: 09/12/2019  Modified Barium Swallow completed.  Full report located under Chart Review in the Imaging Section.  Brief recommendations include the following:  Clinical Impression  Pt's oral and pharyngeal swallow strength and coordination for effective and safe swallowing was within normal limits. He performed several throat clears that were also noted during bedside assessment without aspiration or significant penetration during MBS. Trace, flash penetration x 2 that is within normal limits and should not result in sensation. SLP will upgrade liquids to thin, continue regular texture, pills with thin. Educated pt to consume small sips and bites. ST will follow up briefly.          Swallow Evaluation Recommendations       SLP Diet Recommendations: Regular solids;Thin liquid   Liquid Administration via: Cup;Straw   Medication Administration: Whole meds with liquid   Supervision: Patient able to self feed   Compensations: Slow rate;Small sips/bites   Postural Changes: Seated upright at 90 degrees   Oral Care Recommendations: Oral care BID        Royce Macadamia 09/12/2019,11:56 AM   Breck Coons Lonell Face.Ed Nurse, children's [phone] Office [phone]

## 2019-09-12 NOTE — Progress Notes (Signed)
Nutrition Follow up  DOCUMENTATION CODES:   Obesity unspecified  INTERVENTION:    Glucerna Shake po TID, each supplement provides 220 kcal and 10 grams of protein  MVI daily   NUTRITION DIAGNOSIS:   Inadequate oral intake related to poor appetite as evidenced by per patient/family report.  Ongoing  GOAL:   Patient will meet greater than or equal to 90% of their needs   Progressing  MONITOR:   PO intake, Supplement acceptance, Weight trends, Labs, I & O's  REASON FOR ASSESSMENT:   Consult, Ventilator Enteral/tube feeding initiation and management  ASSESSMENT:   Pt with PMH of substance abuse, CKD, DM, HTN, and seizures in setting of hyperglycemia, previous stroke who was admitted 5/30 with seizures, hyperglycemia and HTN.   6/5- self extubated   Hypernatremic. D5 started. Diet advanced to regular consistency on 6/5. No meal completions documented. Per pt appetite is slow to return. Had a couple bites of breakfast this am. Discussed the importance of protein intake for preservation of lean body mass. Pt willing to try supplementation.   Admission weight: 107.7 kg  Current weight: 108.5 kg   Drips: D5 @ 50 ml/hr  Medications: SS novolog, levemir, MVI, miralax, senokot  Labs: Na 152 (H) K 3.3 (L) CBG 160-109  Diet Order:   Diet Order            Diet Carb Modified Fluid consistency: Thin; Room service appropriate? Yes with Assist  Diet effective now              EDUCATION NEEDS:   Education needs have been addressed  Skin:  Skin Assessment: Reviewed RN Assessment  Last BM:  6/6  Height:   Ht Readings from Last 1 Encounters:  09/04/19 6\' 1"  (1.854 m)    Weight:   Wt Readings from Last 1 Encounters:  09/11/19 108.5 kg    Ideal Body Weight:  83.6 kg  BMI:  Body mass index is 31.56 kg/m.  Estimated Nutritional Needs:   Kcal:  2400-2600 kcal  Protein:  120-135 grams  Fluid:  >/= 2.4 L/day  11/11/19 RD, LDN Clinical  Nutrition Pager listed in AMION

## 2019-09-12 NOTE — Progress Notes (Signed)
[facility] Progress Note  Patient: Micheal Levy    HER:[id number]  DOA: 09/04/2019     Date of Service: the patient was seen and examined on 09/12/2019  Chief Complaint  Patient presents with  . Code Stroke   Brief hospital course: Past medical history of substance abuse, type II DM, HTN, seizures.  Presents with complaints of seizures in the setting of hyperglycemia.  Admitted to the ICU was briefly intubated and self extubated on 09/10/2019. Currently plan is continue current care.  Assessment and Plan: 1.  Seizure Acute metabolic encephalopathy Still remain confused. Etiology of the seizure not clear. UDS negative. EKG shows no evidence of epileptogenic foci. No active seizures. No further seizures since arrival. Started on Keppra will continue on Keppra. Metabolic work-up negative.  2.  Acute hypercapnic respiratory failure Intubated in ED for airway protection. Self extubated on 6 5. Currently has hoarse voice after extubation production monitor.  3.  Aspiration pneumonia MSSA positive on the respiratory culture. Blood cultures negative. Started on cefazolin.  Currently on oral antibiotics. Monitor.  4.  Acute kidney injury on chronic kidney disease stage II Renal function improving.  Monitor.  5.  Severe hypernatremia Likely from poor p.o. intake and dehydration. Improved significantly with D5 infusion and Lasix. Monitor.  6.  Acute hypoxic respiratory failure Acute diastolic CHF IV Lasix x1.  Monitor.  7.  Hypokalemia. Replacing.  8.  Elevated LFT. Viral hepatitis panel negative for LFT continues to improve Continue to monitor.  Likely from shock  9.  Type 2 diabetes mellitus, uncontrolled with hyperglycemia Currently on Levemir and sliding scale insulin.  Monitor.  10.  Constipation. Currently resolved.  Diet: Carb modified DVT Prophylaxis: Subcutaneous Lovenox   Advance goals of care discussion: Full code  Family Communication: no  family was present at bedside, at the time of interview.   Disposition:  Status is: Inpatient  Remains inpatient appropriate because:IV treatments appropriate due to intensity of illness or inability to take PO and Inpatient level of care appropriate due to severity of illness   Dispo: The patient is from: Home              Anticipated d/c is to: Home              Anticipated d/c date is: 2 days              Patient currently is not medically stable to d/c.        Subjective: Confused.  No nausea no vomiting.  No fever no chills.  Wants to go home.  Continues to have shortness of breath.  Physical Exam:  General: Appear in mild distress, no Rash; Oral Mucosa Clear, moist. no Abnormal Neck Mass Or lumps, Conjunctiva normal  Cardiovascular: S1 and S2 Present, no Murmur, Respiratory: good respiratory effort, Bilateral Air entry present and bilateral Crackles, no wheezes Abdomen: Bowel Sound present, Soft and no tenderness Extremities: bilateral  Pedal edema, no calf tenderness Neurology: alert and oriented to time, place, and person affect appropriate. no new focal deficit Gait not checked due to patient safety concerns  Vitals:   09/11/19 2158 09/12/19 0544 09/12/19 0910 09/12/19 1557  BP: (!) 164/91 (!) 192/102 (!) 167/97 (!) 156/93  Pulse: 96 96 (!) 106 92  Resp:  17 18 18   Temp: 97.8 F (36.6 C) 97.8 F (36.6 C) 98.4 F (36.9 C) 98.1 F (36.7 C)  TempSrc: Oral Oral Oral   SpO2: 98% 100% 99% 96%  Weight:  Height:        Intake/Output Summary (Last 24 hours) at 09/12/2019 1901 Last data filed at 09/12/2019 1700 Gross per 24 hour  Intake 198.58 ml  Output 1400 ml  Net -1201.42 ml   Filed Weights   09/09/19 0457 09/10/19 0500 09/11/19 0500  Weight: 110 kg 108.7 kg 108.5 kg    Data Reviewed: I have personally reviewed and interpreted daily labs, tele strips, imagings as discussed above. I reviewed all nursing notes, pharmacy notes, vitals, pertinent old  records I have discussed plan of care as described above with RN and patient/family.  CBC: Recent Labs  Lab 09/08/19 0628 09/09/19 0651 09/10/19 0428 09/11/19 0358 09/12/19 0600  WBC 13.8* 12.6* 15.3* 14.3* 12.4*  NEUTROABS  --   --   --   --  8.1*  HGB 11.5* 10.6* 12.7* 11.0* 10.7*  HCT 38.2* 36.4* 43.8 38.9* 37.6*  MCV 81.8 84.3 85.2 86.1 85.3  PLT 219 267 316 344 433*   Basic Metabolic Panel: Recent Labs  Lab 09/06/19 0525 09/06/19 1650 09/07/19 0551 09/09/19 0651 09/10/19 0428 09/11/19 0358 09/12/19 0600 09/12/19 1300  NA 137  --    < > 150* 151* 154* 152* 145  K 4.0  --    < > 3.3* 3.5 3.4* 3.3* 3.5  CL 103  --    < > 110 108 110 110 106  CO2 22  --    < > 28 28 29  33* 28  GLUCOSE 287*  --    < > 167* 161* 128* 160* 386*  BUN 26*  --    < > 48* 57* 40* 34* 33*  CREATININE 2.75*  --    < > 1.98* 2.19* 1.63* 1.67* 1.75*  CALCIUM 8.7*  --    < > 9.1 10.0 10.0 9.6 9.5  MG 2.4 2.2  --  2.1  --   --   --   --   PHOS 5.0* 3.6  --  4.0  --   --   --   --    < > = values in this interval not displayed.    Studies: DG Swallowing Func-Speech Pathology  Result Date: 09/12/2019 Objective Swallowing Evaluation: Type of Study: Bedside Swallow Evaluation  Patient Details Name: EMILIO BAYLOCK MRN: [name] Date of Birth: July 28, 1980 Today's Date: 09/12/2019 Time: SLP Start Time (ACUTE ONLY): 0841 -SLP Stop Time (ACUTE ONLY): 0856 SLP Time Calculation (min) (ACUTE ONLY): 15 min Past Medical History: Past Medical History: Diagnosis Date . Chronic kidney disease  . Diabetes mellitus without complication (HCC)  . Elevated serum cholesterol 04/2019 . Elevated serum creatinine 04/2019 . History of seizures  . Hyperglycemia  . Hypertension  . Hypertensive urgency 04/2019 . Hypokalemia  . Seizures (HCC) 03/2017  x 2  . Stroke (HCC) 03/2017 . Substance abuse (HCC)  . Vitamin D deficiency 04/2019 Past Surgical History: No past surgical history on file. HPI: AHMAAD NEIDHARDT is a 39 y.o. male with  a history of substance abuse, DM, HTN , seizures in the setting of hyperglycemia, and previous stroke, who presented to the ED 5/30 early am with seizures, hyperglycemia and HTN. CT and MRI Head were negative for stroke. CTA was negative for PE. CXR 6/3 showed improving B aeration. Pt was intubated 5/30-6/5 with self extubation.  Subjective: Pt awake, alert, pleasant, participative. Family/visitor arrived at end of session Assessment / Plan / Recommendation CHL IP CLINICAL IMPRESSIONS 09/12/2019 Clinical Impression Pt's oral and pharyngeal swallow strength and  coordination for effective and safe swallowing was within normal limits. He performed several throat clears that were also noted during bedside assessment without aspiration or significant penetration during MBS. Trace, flash penetration x 2 that is within normal limits and should not result in sensation. SLP will upgrade liquids to thin, continue regular texture, pills with thin. Educated pt to consume small sips and bites. ST will follow up briefly.        SLP Visit Diagnosis Dysphagia, unspecified (R13.10) Attention and concentration deficit following -- Frontal lobe and executive function deficit following -- Impact on safety and function Mild aspiration risk   CHL IP TREATMENT RECOMMENDATION 09/12/2019 Treatment Recommendations Therapy as outlined in treatment plan below   Prognosis 09/12/2019 Prognosis for Safe Diet Advancement Good Barriers to Reach Goals -- Barriers/Prognosis Comment -- CHL IP DIET RECOMMENDATION 09/12/2019 SLP Diet Recommendations Regular solids;Thin liquid Liquid Administration via Cup;Straw Medication Administration Whole meds with liquid Compensations Slow rate;Small sips/bites Postural Changes Seated upright at 90 degrees   CHL IP OTHER RECOMMENDATIONS 09/12/2019 Recommended Consults -- Oral Care Recommendations Oral care BID Other Recommendations --   CHL IP FOLLOW UP RECOMMENDATIONS 09/12/2019 Follow up Recommendations None   CHL IP  FREQUENCY AND DURATION 09/12/2019 Speech Therapy Frequency (ACUTE ONLY) min 2x/week Treatment Duration 2 weeks      CHL IP ORAL PHASE 09/12/2019 Oral Phase WFL Oral - Pudding Teaspoon -- Oral - Pudding Cup -- Oral - Honey Teaspoon -- Oral - Honey Cup -- Oral - Nectar Teaspoon -- Oral - Nectar Cup -- Oral - Nectar Straw -- Oral - Thin Teaspoon -- Oral - Thin Cup -- Oral - Thin Straw -- Oral - Puree -- Oral - Mech Soft -- Oral - Regular -- Oral - Multi-Consistency -- Oral - Pill -- Oral Phase - Comment --  CHL IP PHARYNGEAL PHASE 09/12/2019 Pharyngeal Phase WFL Pharyngeal- Pudding Teaspoon -- Pharyngeal -- Pharyngeal- Pudding Cup -- Pharyngeal -- Pharyngeal- Honey Teaspoon -- Pharyngeal -- Pharyngeal- Honey Cup -- Pharyngeal -- Pharyngeal- Nectar Teaspoon -- Pharyngeal -- Pharyngeal- Nectar Cup -- Pharyngeal -- Pharyngeal- Nectar Straw -- Pharyngeal -- Pharyngeal- Thin Teaspoon -- Pharyngeal -- Pharyngeal- Thin Cup -- Pharyngeal -- Pharyngeal- Thin Straw -- Pharyngeal -- Pharyngeal- Puree -- Pharyngeal -- Pharyngeal- Mechanical Soft -- Pharyngeal -- Pharyngeal- Regular -- Pharyngeal -- Pharyngeal- Multi-consistency -- Pharyngeal -- Pharyngeal- Pill -- Pharyngeal -- Pharyngeal Comment --  CHL IP CERVICAL ESOPHAGEAL PHASE 09/12/2019 Cervical Esophageal Phase WFL Pudding Teaspoon -- Pudding Cup -- Honey Teaspoon -- Honey Cup -- Nectar Teaspoon -- Nectar Cup -- Nectar Straw -- Thin Teaspoon -- Thin Cup -- Thin Straw -- Puree -- Mechanical Soft -- Regular -- Multi-consistency -- Pill -- Cervical Esophageal Comment -- Royce Macadamia 09/12/2019, 11:56 AM Breck Coons Lonell Face.Ed Sports administrator Pager [phone] Office [phone]               Scheduled Meds: . amLODipine  10 mg Oral Daily  . aspirin  81 mg Oral Daily  . Chlorhexidine Gluconate Cloth  6 each Topical Daily  . feeding supplement (GLUCERNA SHAKE)  237 mL Oral TID BM  . fluticasone  2 spray Each Nare Daily  . heparin injection (subcutaneous)   5,000 Units Subcutaneous Q8H  . hydrALAZINE  100 mg Oral Q8H  . insulin aspart  0-5 Units Subcutaneous QHS  . insulin aspart  0-9 Units Subcutaneous TID WC  . insulin detemir  15 Units Subcutaneous BID  . isosorbide mononitrate  30 mg Oral Daily  .  levETIRAcetam  1,000 mg Oral BID  . mouth rinse  15 mL Mouth Rinse BID  . metoprolol tartrate  50 mg Oral BID  . multivitamin with minerals  1 tablet Oral Daily  . pantoprazole  40 mg Oral Daily  . polyethylene glycol  17 g Oral BID  . senna-docusate  1 tablet Oral BID  . sodium chloride flush  10-40 mL Intracatheter Q12H   Continuous Infusions: PRN Meds: acetaminophen, albuterol, labetalol, ondansetron (ZOFRAN) IV, Resource ThickenUp Clear, sodium chloride flush  Time spent: 35 minutes  Author: Berle Mull, MD [facility] 09/12/2019 7:01 PM  To reach On-call, see care teams to locate the attending and reach out via [email or web address]. Between 7PM-7AM, please contact night-coverage If you still have difficulty reaching the attending provider, please page the [facility] (Director on Call) for [facility] on amion for assistance.

## 2019-09-12 NOTE — Progress Notes (Signed)
Inpatient Diabetes Program Recommendations  AACE/ADA: New Consensus Statement on Inpatient Glycemic Control (2015)  Target Ranges:  Prepandial:   less than 140 mg/dL      Peak postprandial:   less than 180 mg/dL (1-2 hours)      Critically ill patients:  140 - 180 mg/dL   Lab Results  Component Value Date   GLUCAP 284 (H) 09/12/2019   HGBA1C 11.4 (H) 09/04/2019    Review of Glycemic Control Results for Micheal Levy, Micheal Levy (MRN [id number]) as of 09/12/2019 14:23  Ref. Range 09/12/2019 07:48 09/12/2019 12:01  Glucose-Capillary Latest Ref Range: 70 - 99 mg/dL 654 (H) 650 (H)   Diabetes history: DM 2 Outpatient Diabetes medications: Glipizide 10 mg Daily, metformin 1000 mg bid, Lantus 20 units (stopped taking 2 years ago) Current orders for Inpatient glycemic control:  Levemir 15 units bid Novolog 0-9 units tid + hs  A1c 11.4% this admission  Inpatient Diabetes Program Recommendations:    Consider Novolog 4 units tid meal coverage if eating >50% of meals.  Spoke with pt at bedside regarding A1c and glucose control. Pt goes to the [facility] and last visit he reports being in January. Pt had not been on Lantus for 2 years. Pt reports not taking the glipizide and metformin as prescribed and described himself rationing out his oral medications as "pharmacy was trying to work out some things."  Pt needs close follow up with clinic with appointment made. Pt said he was able to get his medications through the pharmacy. Pt has questions about applying for insurance and assistance. informed him the clinic could assist. Will place [facility] consult just in case.   Discussed current A1c. Discussed glucose and A1c goals. Pt did not make a lot of eye contact during conversation.  Thanks,  Christena Deem RN, MSN, BC-ADM Inpatient Diabetes Coordinator Team Pager [phone] (8a-5p)

## 2019-09-13 LAB — CBC
HCT: 42.7 % (ref 39.0–52.0)
Hemoglobin: 12.5 g/dL — ABNORMAL LOW (ref 13.0–17.0)
MCH: 25 pg — ABNORMAL LOW (ref 26.0–34.0)
MCHC: 29.3 g/dL — ABNORMAL LOW (ref 30.0–36.0)
MCV: 85.2 fL (ref 80.0–100.0)
Platelets: 522 10*3/uL — ABNORMAL HIGH (ref 150–400)
RBC: 5.01 MIL/uL (ref 4.22–5.81)
RDW: 17.2 % — ABNORMAL HIGH (ref 11.5–15.5)
WBC: 12.6 10*3/uL — ABNORMAL HIGH (ref 4.0–10.5)
nRBC: 1 % — ABNORMAL HIGH (ref 0.0–0.2)

## 2019-09-13 LAB — MAGNESIUM: Magnesium: 1.9 mg/dL (ref 1.7–2.4)

## 2019-09-13 LAB — BASIC METABOLIC PANEL
Anion gap: 13 (ref 5–15)
BUN: 31 mg/dL — ABNORMAL HIGH (ref 6–20)
CO2: 27 mmol/L (ref 22–32)
Calcium: 9.5 mg/dL (ref 8.9–10.3)
Chloride: 107 mmol/L (ref 98–111)
Creatinine, Ser: 1.61 mg/dL — ABNORMAL HIGH (ref 0.61–1.24)
GFR calc Af Amer: >60 mL/min (ref >60)
GFR calc non Af Amer: 52 mL/min — ABNORMAL LOW (ref >60)
Glucose, Bld: 242 mg/dL — ABNORMAL HIGH (ref 70–99)
Potassium: 3.4 mmol/L — ABNORMAL LOW (ref 3.5–5.1)
Sodium: 147 mmol/L — ABNORMAL HIGH (ref 135–145)

## 2019-09-13 LAB — GLUCOSE, CAPILLARY
Glucose-Capillary: 162 mg/dL — ABNORMAL HIGH (ref 70–99)
Glucose-Capillary: 209 mg/dL — ABNORMAL HIGH (ref 70–99)
Glucose-Capillary: 266 mg/dL — ABNORMAL HIGH (ref 70–99)

## 2019-09-13 MED ORDER — HYDRALAZINE HCL 100 MG PO TABS: 100.0000 mg | ORAL_TABLET | Freq: Three times a day (TID) | ORAL | 0 refills | Status: DC

## 2019-09-13 MED ORDER — FUROSEMIDE 20 MG PO TABS
20.0000 mg | ORAL_TABLET | Freq: Every day | ORAL | 0 refills | Status: DC
Start: 2019-09-13 — End: 2019-11-16

## 2019-09-13 MED ORDER — INSULIN ASPART 100 UNIT/ML ~~LOC~~ SOLN
0.0000 [IU] | Freq: Three times a day (TID) | SUBCUTANEOUS | Status: DC
  Administered 2019-09-13: 8 [IU] via SUBCUTANEOUS

## 2019-09-13 MED ORDER — PANTOPRAZOLE SODIUM 40 MG PO TBEC: 40.0000 mg | DELAYED_RELEASE_TABLET | Freq: Every day | ORAL | 0 refills | Status: DC

## 2019-09-13 MED ORDER — FUROSEMIDE 10 MG/ML IJ SOLN
20.0000 mg | Freq: Once | INTRAMUSCULAR | Status: AC
  Administered 2019-09-13: 20 mg via INTRAVENOUS
  Filled 2019-09-13: qty 2

## 2019-09-13 MED ORDER — POLYETHYLENE GLYCOL 3350 17 G PO PACK: 17.0000 g | PACK | Freq: Every day | ORAL | 0 refills | Status: DC

## 2019-09-13 MED ORDER — GLUCERNA SHAKE PO LIQD: 237.0000 mL | Freq: Three times a day (TID) | ORAL | 0 refills | Status: DC

## 2019-09-13 MED ORDER — METOPROLOL TARTRATE 50 MG PO TABS: 50.0000 mg | ORAL_TABLET | Freq: Two times a day (BID) | ORAL | 0 refills | Status: DC

## 2019-09-13 MED ORDER — INSULIN ASPART 100 UNIT/ML ~~LOC~~ SOLN: 0.0000 [IU] | Freq: Every day | SUBCUTANEOUS | Status: DC

## 2019-09-13 MED ORDER — ISOSORBIDE MONONITRATE ER 30 MG PO TB24: 30.0000 mg | ORAL_TABLET | Freq: Every day | ORAL | 0 refills | Status: DC

## 2019-09-13 MED ORDER — LANTUS SOLOSTAR 100 UNIT/ML ~~LOC~~ SOPN: 30.0000 [IU] | PEN_INJECTOR | Freq: Every day | SUBCUTANEOUS | 0 refills | Status: DC

## 2019-09-13 MED ORDER — LEVETIRACETAM 1000 MG PO TABS: 1000.0000 mg | ORAL_TABLET | Freq: Two times a day (BID) | ORAL | 0 refills | Status: DC

## 2019-09-13 MED ORDER — ASPIRIN EC 81 MG PO TBEC: 81.0000 mg | DELAYED_RELEASE_TABLET | Freq: Every day | ORAL | 0 refills | Status: DC

## 2019-09-13 MED FILL — !LANTUS SOLOSTAR 100UNITS/M: 100 | 30 days supply | Qty: 9 | Fill #0

## 2019-09-13 NOTE — Plan of Care (Signed)
  Problem: Education: Goal: Knowledge of General Education information will improve Description: Including pain rating scale, medication(s)/side effects and non-pharmacologic comfort measures 09/13/2019 1429 by Coy Saunas, RN Outcome: Adequate for Discharge 09/13/2019 1108 by Coy Saunas, RN Outcome: Progressing   Problem: Health Behavior/Discharge Planning: Goal: Ability to manage health-related needs will improve 09/13/2019 1429 by Coy Saunas, RN Outcome: Adequate for Discharge 09/13/2019 1108 by Coy Saunas, RN Outcome: Progressing   Problem: Clinical Measurements: Goal: Ability to maintain clinical measurements within normal limits will improve 09/13/2019 1429 by Coy Saunas, RN Outcome: Adequate for Discharge 09/13/2019 1108 by Coy Saunas, RN Outcome: Progressing Goal: Will remain free from infection 09/13/2019 1429 by Coy Saunas, RN Outcome: Adequate for Discharge 09/13/2019 1108 by Coy Saunas, RN Outcome: Progressing Goal: Diagnostic test results will improve 09/13/2019 1429 by Coy Saunas, RN Outcome: Adequate for Discharge 09/13/2019 1108 by Coy Saunas, RN Outcome: Progressing Goal: Respiratory complications will improve 09/13/2019 1429 by Coy Saunas, RN Outcome: Adequate for Discharge 09/13/2019 1108 by Coy Saunas, RN Outcome: Progressing Goal: Cardiovascular complication will be avoided 09/13/2019 1429 by Coy Saunas, RN Outcome: Adequate for Discharge 09/13/2019 1108 by Coy Saunas, RN Outcome: Progressing   Problem: Activity: Goal: Risk for activity intolerance will decrease 09/13/2019 1429 by Coy Saunas, RN Outcome: Adequate for Discharge 09/13/2019 1108 by Coy Saunas, RN Outcome: Progressing   Problem: Nutrition: Goal: Adequate nutrition will be maintained 09/13/2019 1429 by Coy Saunas, RN Outcome: Adequate for Discharge 09/13/2019 1108 by Coy Saunas, RN Outcome: Progressing   Problem: Coping: Goal: Level of anxiety  will decrease 09/13/2019 1429 by Coy Saunas, RN Outcome: Adequate for Discharge 09/13/2019 1108 by Coy Saunas, RN Outcome: Progressing   Problem: Elimination: Goal: Will not experience complications related to bowel motility 09/13/2019 1429 by Coy Saunas, RN Outcome: Adequate for Discharge 09/13/2019 1108 by Coy Saunas, RN Outcome: Progressing Goal: Will not experience complications related to urinary retention 09/13/2019 1429 by Coy Saunas, RN Outcome: Adequate for Discharge 09/13/2019 1108 by Coy Saunas, RN Outcome: Progressing   Problem: Pain Managment: Goal: General experience of comfort will improve 09/13/2019 1429 by Coy Saunas, RN Outcome: Adequate for Discharge 09/13/2019 1108 by Coy Saunas, RN Outcome: Progressing   Problem: Safety: Goal: Ability to remain free from injury will improve 09/13/2019 1429 by Coy Saunas, RN Outcome: Adequate for Discharge 09/13/2019 1108 by Coy Saunas, RN Outcome: Progressing   Problem: Skin Integrity: Goal: Risk for impaired skin integrity will decrease 09/13/2019 1429 by Coy Saunas, RN Outcome: Adequate for Discharge 09/13/2019 1108 by Coy Saunas, RN Outcome: Progressing   Problem: Education: Goal: Expressions of having a comfortable level of knowledge regarding the disease process will increase 09/13/2019 1429 by Coy Saunas, RN Outcome: Adequate for Discharge 09/13/2019 1108 by Coy Saunas, RN Outcome: Progressing   Problem: Coping: Goal: Ability to adjust to condition or change in health will improve 09/13/2019 1429 by Coy Saunas, RN Outcome: Adequate for Discharge 09/13/2019 1108 by Coy Saunas, RN Outcome: Progressing Goal: Ability to identify appropriate support needs will improve 09/13/2019 1429 by Coy Saunas, RN Outcome: Adequate for Discharge 09/13/2019 1108 by Coy Saunas, RN Outcome: Progressing     Patient discharged home.  Patient and wife were given discharge instructions  and both stated understanding.

## 2019-09-13 NOTE — Progress Notes (Signed)
SATURATION QUALIFICATIONS: (This note is used to comply with regulatory documentation for home oxygen)  Patient Saturations on Room Air at Rest = 96%  Patient Saturations on Room Air while Ambulating = 90%  Patient Saturations on 1  Liters of oxygen while Ambulating = 95%  Please briefly explain why patient needs home oxygen: Patient did fine with out oxygen

## 2019-09-13 NOTE — Progress Notes (Signed)
Inpatient Diabetes Program Recommendations  AACE/ADA: New Consensus Statement on Inpatient Glycemic Control (2015)  Target Ranges:  Prepandial:   less than 140 mg/dL      Peak postprandial:   less than 180 mg/dL (1-2 hours)      Critically ill patients:  140 - 180 mg/dL   Lab Results  Component Value Date   GLUCAP 209 (H) 09/13/2019   HGBA1C 11.4 (H) 09/04/2019    Review of Glycemic Control   Diabetes history: DM 2 Outpatient Diabetes medications: Glipizide 10 mg Daily, metformin 1000 mg bid, Lantus 20 units (stopped taking 2 years ago) Current orders for Inpatient glycemic control:  Levemir 15 units bid Novolog 0-15 units tid + hs  A1c 11.4% this admission  Inpatient Diabetes Program Recommendations:    Consider Novolog 4 units tid meal coverage if eating >50% of meals.   Thanks,  Christena Deem RN, MSN, BC-ADM Inpatient Diabetes Coordinator Team Pager [phone] (8a-5p)

## 2019-09-13 NOTE — Plan of Care (Signed)
  Problem: Education: Goal: Knowledge of General Education information will improve Description: Including pain rating scale, medication(s)/side effects and non-pharmacologic comfort measures Outcome: Progressing   Problem: Health Behavior/Discharge Planning: Goal: Ability to manage health-related needs will improve Outcome: Progressing   Problem: Clinical Measurements: Goal: Ability to maintain clinical measurements within normal limits will improve Outcome: Progressing Goal: Will remain free from infection Outcome: Progressing Goal: Diagnostic test results will improve Outcome: Progressing Goal: Respiratory complications will improve Outcome: Progressing Goal: Cardiovascular complication will be avoided Outcome: Progressing   Problem: Activity: Goal: Risk for activity intolerance will decrease Outcome: Progressing   Problem: Nutrition: Goal: Adequate nutrition will be maintained Outcome: Progressing   Problem: Coping: Goal: Level of anxiety will decrease Outcome: Progressing   Problem: Elimination: Goal: Will not experience complications related to bowel motility Outcome: Progressing Goal: Will not experience complications related to urinary retention Outcome: Progressing   Problem: Pain Managment: Goal: General experience of comfort will improve Outcome: Progressing   Problem: Safety: Goal: Ability to remain free from injury will improve Outcome: Progressing   Problem: Skin Integrity: Goal: Risk for impaired skin integrity will decrease Outcome: Progressing   Problem: Education: Goal: Expressions of having a comfortable level of knowledge regarding the disease process will increase Outcome: Progressing   Problem: Coping: Goal: Ability to adjust to condition or change in health will improve Outcome: Progressing Goal: Ability to identify appropriate support needs will improve Outcome: Progressing   Problem: Health Behavior/Discharge Planning: Goal:  Compliance with prescribed medication regimen will improve Outcome: Progressing   Problem: Medication: Goal: Risk for medication side effects will decrease Outcome: Progressing   Problem: Clinical Measurements: Goal: Complications related to the disease process, condition or treatment will be avoided or minimized Outcome: Progressing Goal: Diagnostic test results will improve Outcome: Progressing   Problem: Safety: Goal: Verbalization of understanding the information provided will improve Outcome: Progressing   Problem: Self-Concept: Goal: Level of anxiety will decrease Outcome: Progressing Goal: Ability to verbalize feelings about condition will improve Outcome: Progressing   Problem: Activity: Goal: Ability to tolerate increased activity will improve Outcome: Progressing   Problem: Respiratory: Goal: Ability to maintain a clear airway and adequate ventilation will improve Outcome: Progressing   Problem: Role Relationship: Goal: Method of communication will improve Outcome: Progressing   

## 2019-09-13 NOTE — Plan of Care (Signed)
  Problem: Clinical Measurements: Goal: Ability to maintain clinical measurements within normal limits will improve Outcome: Progressing Goal: Will remain free from infection Outcome: Progressing Goal: Diagnostic test results will improve Outcome: Progressing Goal: Respiratory complications will improve Outcome: Progressing Goal: Cardiovascular complication will be avoided Outcome: Progressing   Problem: Activity: Goal: Risk for activity intolerance will decrease Outcome: Progressing   Problem: Nutrition: Goal: Adequate nutrition will be maintained Outcome: Progressing   Problem: Coping: Goal: Level of anxiety will decrease Outcome: Progressing   Problem: Elimination: Goal: Will not experience complications related to bowel motility Outcome: Progressing Goal: Will not experience complications related to urinary retention Outcome: Progressing   Problem: Pain Managment: Goal: General experience of comfort will improve Outcome: Progressing   

## 2019-09-14 NOTE — Discharge Summary (Signed)
[facility] Discharge Summary   Patient: Micheal Levy ZOX:[id number]  PCP: Mike Gip, FNP  Date of admission: 09/04/2019   Date of discharge: 09/13/2019      Discharge Diagnoses:  Principal diagnosis seizures   Active Problems:   Seizures (HCC)   Acute respiratory failure with hypoxemia (HCC)   Admitted From: home Disposition:  Home   Recommendations for Outpatient Follow-up:  1. PCP: please follow up with PCP in 1 week 2. Follow up LABS/TEST:  none  Follow-up Information    [facility]. Go on 09/21/2019.   Specialty: Internal Medicine Why: Hospital Follow up appointment on Wednesday 09/21/19 at 150pm.  Contact information: [address] [place] [address] [phone]         Diet recommendation: Cardiac diet  Activity: The patient is advised to gradually reintroduce usual activities, as tolerated  Discharge Condition: stable  Code Status: Full code   History of present illness: As per the H and P dictated on admission, "Micheal J Milleris a 39 y.o.malewith a history of substance abuse, DM, HTN , seizures in the setting of hyperglycemia, and previous stroke who was in his normal state of health when he went to bed at about midnight He did wake up in the middle the night got up to use the bathroom, but it is unclear if he was completely normal when he did that. 09/04/2019 around 3:45 am, his SO awoke to find  him having seizure activity. En route, he was speaking and moving his right side, but did not return to baseline.After arrival to the emergency department, he developed a progressive aphasia, and then was noted to be hemiparetic on the right. Due to previous history of stroke and DM, CTA/CTP was ordered which was negative.  He was seen in the ED by Dr. Amada Jupiter ( Neuro). There was concern for possibility of Thalmic stroke, emergent  MRI was done which was negative for acute infarct. Continuous EEG monitoring has  been ordered. He was loaded with 1 gram of Keppra. He was intubated in the ED for airway protection.    Labs in the Ed were significant for  K of 2.6 , Na of 133, Glucose of 592, then 616., CO2 25, WBC of 13,000 Home medication include Glucotrol and Metformin UA with > 300 protein and > 500 glucose. Initial ABG: 7/37 /59.2 /261/ 34.6 Orders were written for insulin gtt.  Pt was biting ETT, and he was started on Propofol and Fentanyl for sedation. Urine Drug screen is pending. PCCM were asked to admit and manage care."  Hospital Course:  Summary of his active problems in the hospital is as following. 1.  Seizure Acute metabolic encephalopathy Still remain confused. Etiology of the seizure not clear. UDS negative. EKG shows no evidence of epileptogenic foci. No active seizures. No further seizures since arrival. Started on Keppra will continue on Keppra.  At this point, patient will most likely be discharged on Keppra.  However if this was a provoked seizure in the setting of hyperglycemia, it is possible that patient can be weaned off of Keppra in near future.  -Follow-up with neurology in 8 to 12 weeks after discharge Metabolic work-up negative.  2.  Acute hypercapnic respiratory failure Intubated in ED for airway protection. Self extubated on 6 5. Currently has hoarse voice after extubation, gradually improving.  3.  Aspiration pneumonia MSSA positive on the respiratory culture. Blood cultures negative. Started on cefazolin.  completed oral antibiotics. Speech  cleared the pt for regular diet  4.  Acute kidney injury on chronic kidney disease stage II Renal function improving.  Monitor.  5.  Severe hypernatremia Likely from poor p.o. intake and dehydration. Improved significantly with D5 infusion and Lasix.  6.  Acute hypoxic respiratory failure Acute diastolic CHF IV Lasix x2. Oral PRN lasix at home  7.  Hypokalemia. Replaced  8.  Elevated LFT. Viral  hepatitis panel negative for LFT continues to improve Likely from shock  9.  Type 2 diabetes mellitus, uncontrolled with hyperglycemia Currently on Levemir and sliding scale insulin.  Continue home regimen  10.  Constipation. Currently resolved.  Patient was ambulatory without any assistance. On the day of the discharge the patient's vitals were stable, and no other acute medical condition were reported by patient. the patient was felt safe to be discharge at Home with no therapy needed on discharge.  Consultants: PCCM primary admission , Neurology  Procedures: LTM EEG  Discharge Exam: General: Appear in no distress, no Rash; Oral Mucosa Clear, moist. Cardiovascular: S1 and S2 Present, no Murmur, Respiratory: normal respiratory effort, Bilateral Air entry present and no Crackles, no wheezes Abdomen: Bowel Sound present, Soft and no tenderness, no hernia Extremities: bilateral Pedal edema, no calf tenderness Neurology: alert and oriented to time, place, and person affect appropriate.  Filed Weights   09/10/19 0500 09/11/19 0500 09/13/19 0342  Weight: 108.7 kg 108.5 kg 107 kg   Vitals:   09/13/19 0804 09/13/19 1400  BP: (!) 177/90 (!) 156/89  Pulse: (!) 101 99  Resp: 18   Temp: 99 F (37.2 C) 98.8 F (37.1 C)  SpO2: 96% 96%    DISCHARGE MEDICATION: Allergies as of 09/13/2019   No Known Allergies     Medication List    STOP taking these medications   hydrochlorothiazide 25 MG tablet Commonly known as: HYDRODIURIL   potassium chloride 10 MEQ tablet Commonly known as: KLOR-CON     TAKE these medications   albuterol 108 (90 Base) MCG/ACT inhaler Commonly known as: VENTOLIN HFA Inhale 2 puffs into the lungs every 6 (six) hours as needed for wheezing or shortness of breath.   albuterol (2.5 MG/3ML) 0.083% nebulizer solution Commonly known as: PROVENTIL Take 3 mLs (2.5 mg total) by nebulization every 6 (six) hours as needed for wheezing or shortness of breath.     amLODipine 10 MG tablet Commonly known as: NORVASC Take 1 tablet (10 mg total) by mouth daily.   aspirin EC 81 MG tablet Take 1 tablet (81 mg total) by mouth daily. What changed:   when to take this  reasons to take this   atorvastatin 10 MG tablet Commonly known as: LIPITOR Take 1 tablet (10 mg total) by mouth daily.   feeding supplement (GLUCERNA SHAKE) Liqd Take 237 mLs by mouth 3 (three) times daily between meals.   furosemide 20 MG tablet Commonly known as: Lasix Take 1 tablet (20 mg total) by mouth daily for 10 days.   glipiZIDE 10 MG 24 hr tablet Commonly known as: Glucotrol XL Take 1 tablet (10 mg total) by mouth daily with breakfast.   hydrALAZINE 100 MG tablet Commonly known as: APRESOLINE Take 1 tablet (100 mg total) by mouth 3 (three) times daily.   INSULIN SYRINGE .5CC/29G 29G X 1/2" 0.5 ML Misc Commonly known as: B-D INS SYR ULTRAFINE .5CC/29G 1 each by Does not apply route at bedtime.   isosorbide mononitrate 30 MG 24 hr tablet Commonly known as: IMDUR Take 1  tablet (30 mg total) by mouth daily.   Lancets Misc 1 each by Does not apply route 4 (four) times daily -  before meals and at bedtime.   Lantus SoloStar 100 UNIT/ML Solostar Pen Generic drug: insulin glargine Inject 30 Units into the skin daily at 10 pm. What changed: how much to take   levETIRAcetam 1000 MG tablet Commonly known as: KEPPRA Take 1 tablet (1,000 mg total) by mouth 2 (two) times daily. What changed:   medication strength  how much to take   metFORMIN 1000 MG tablet Commonly known as: GLUCOPHAGE Take 1 tablet (1,000 mg total) by mouth 2 (two) times daily with a meal.   metoprolol tartrate 50 MG tablet Commonly known as: LOPRESSOR Take 1 tablet (50 mg total) by mouth 2 (two) times daily.   pantoprazole 40 MG tablet Commonly known as: PROTONIX Take 1 tablet (40 mg total) by mouth daily.   Pen Needles 31G X 6 MM Misc 1 each by Does not apply route at bedtime.    polyethylene glycol 17 g packet Commonly known as: MIRALAX / GLYCOLAX Take 17 g by mouth daily.   True Metrix Air Glucose Meter Devi 1 each by Does not apply route 4 (four) times daily -  before meals and at bedtime.   True Metrix Blood Glucose Test test strip Generic drug: glucose blood Use as instructed   Vitamin D (Ergocalciferol) 1.25 MG (50000 UNIT) Caps capsule Commonly known as: DRISDOL Take 1 capsule (50,000 Units total) by mouth every 7 (seven) days.      No Known Allergies Discharge Instructions    Diet - low sodium heart healthy   Complete by: As directed    Diet Carb Modified   Complete by: As directed    Increase activity slowly   Complete by: As directed       The results of significant diagnostics from this hospitalization (including imaging, microbiology, ancillary and laboratory) are listed below for reference.    Significant Diagnostic Studies: CT Code Stroke CTA Head W/WO contrast  Result Date: 09/04/2019 CLINICAL DATA:  Aphasia EXAM: CT ANGIOGRAPHY HEAD AND NECK CT PERFUSION BRAIN TECHNIQUE: Multidetector CT imaging of the head and neck was performed using the standard protocol during bolus administration of intravenous contrast. Multiplanar CT image reconstructions and MIPs were obtained to evaluate the vascular anatomy. Carotid stenosis measurements (when applicable) are obtained utilizing NASCET criteria, using the distal internal carotid diameter as the denominator. Multiphase CT imaging of the brain was performed following IV bolus contrast injection. Subsequent parametric perfusion maps were calculated using RAPID software. CONTRAST:  OMNIPAQUE IOHEXOL 350 MG/ML SOLN COMPARISON:  None. FINDINGS: CTA NECK FINDINGS SKELETON: There is no bony spinal canal stenosis. No lytic or blastic lesion. OTHER NECK: Normal pharynx, larynx and major salivary glands. No cervical lymphadenopathy. Unremarkable thyroid gland. UPPER CHEST: No pneumothorax or pleural  effusion. No nodules or masses. AORTIC ARCH: There is no calcific atherosclerosis of the aortic arch. There is no aneurysm, dissection or hemodynamically significant stenosis of the visualized portion of the aorta. Conventional 3 vessel aortic branching pattern. The visualized proximal subclavian arteries are widely patent. RIGHT CAROTID SYSTEM: Normal without aneurysm, dissection or stenosis. LEFT CAROTID SYSTEM: Normal without aneurysm, dissection or stenosis. VERTEBRAL ARTERIES: Left dominant configuration. Both origins are clearly patent. There is no dissection, occlusion or flow-limiting stenosis to the skull base (V1-V3 segments). CTA HEAD FINDINGS POSTERIOR CIRCULATION: --Vertebral arteries: Normal V4 segments. --Inferior cerebellar arteries: Normal. --Basilar artery: Normal. --Superior cerebellar  arteries: Normal. --Posterior cerebral arteries (PCA): Normal. ANTERIOR CIRCULATION: --Intracranial internal carotid arteries: Normal. --Anterior cerebral arteries (ACA): Normal. Both A1 segments are present. Patent anterior communicating artery (a-comm). --Middle cerebral arteries (MCA): Normal. VENOUS SINUSES: As permitted by contrast timing, patent. ANATOMIC VARIANTS: None Review of the MIP images confirms the above findings. CT Brain Perfusion Findings: ASPECTS: 10 CBF (<30%) Volume: 0mL Perfusion (Tmax>6.0s) volume: 27mL Mismatch Volume: 27mL Infarction Location:The areas of ischemia calculated by the RAPID software do not conform to any coherent pattern and are likely artifacts caused by patient motion during the acquisition. IMPRESSION: 1. No emergent large vessel occlusion or high-grade stenosis. 2. Motion-degraded CT perfusion scan. Areas of calculated ischemia are likely artifactual. Electronically Signed   By: Deatra Robinson M.D.   On: 09/04/2019 05:58   CT Code Stroke CTA Neck W/WO contrast  Result Date: 09/04/2019 CLINICAL DATA:  Aphasia EXAM: CT ANGIOGRAPHY HEAD AND NECK CT PERFUSION BRAIN  TECHNIQUE: Multidetector CT imaging of the head and neck was performed using the standard protocol during bolus administration of intravenous contrast. Multiplanar CT image reconstructions and MIPs were obtained to evaluate the vascular anatomy. Carotid stenosis measurements (when applicable) are obtained utilizing NASCET criteria, using the distal internal carotid diameter as the denominator. Multiphase CT imaging of the brain was performed following IV bolus contrast injection. Subsequent parametric perfusion maps were calculated using RAPID software. CONTRAST:  OMNIPAQUE IOHEXOL 350 MG/ML SOLN COMPARISON:  None. FINDINGS: CTA NECK FINDINGS SKELETON: There is no bony spinal canal stenosis. No lytic or blastic lesion. OTHER NECK: Normal pharynx, larynx and major salivary glands. No cervical lymphadenopathy. Unremarkable thyroid gland. UPPER CHEST: No pneumothorax or pleural effusion. No nodules or masses. AORTIC ARCH: There is no calcific atherosclerosis of the aortic arch. There is no aneurysm, dissection or hemodynamically significant stenosis of the visualized portion of the aorta. Conventional 3 vessel aortic branching pattern. The visualized proximal subclavian arteries are widely patent. RIGHT CAROTID SYSTEM: Normal without aneurysm, dissection or stenosis. LEFT CAROTID SYSTEM: Normal without aneurysm, dissection or stenosis. VERTEBRAL ARTERIES: Left dominant configuration. Both origins are clearly patent. There is no dissection, occlusion or flow-limiting stenosis to the skull base (V1-V3 segments). CTA HEAD FINDINGS POSTERIOR CIRCULATION: --Vertebral arteries: Normal V4 segments. --Inferior cerebellar arteries: Normal. --Basilar artery: Normal. --Superior cerebellar arteries: Normal. --Posterior cerebral arteries (PCA): Normal. ANTERIOR CIRCULATION: --Intracranial internal carotid arteries: Normal. --Anterior cerebral arteries (ACA): Normal. Both A1 segments are present. Patent anterior communicating  artery (a-comm). --Middle cerebral arteries (MCA): Normal. VENOUS SINUSES: As permitted by contrast timing, patent. ANATOMIC VARIANTS: None Review of the MIP images confirms the above findings. CT Brain Perfusion Findings: ASPECTS: 10 CBF (<30%) Volume: 0mL Perfusion (Tmax>6.0s) volume: 27mL Mismatch Volume: 27mL Infarction Location:The areas of ischemia calculated by the RAPID software do not conform to any coherent pattern and are likely artifacts caused by patient motion during the acquisition. IMPRESSION: 1. No emergent large vessel occlusion or high-grade stenosis. 2. Motion-degraded CT perfusion scan. Areas of calculated ischemia are likely artifactual. Electronically Signed   By: Deatra Robinson M.D.   On: 09/04/2019 05:58   MR BRAIN WO CONTRAST  Result Date: 09/04/2019 CLINICAL DATA:  Aphasia, code stroke follow-up EXAM: MRI HEAD WITHOUT CONTRAST TECHNIQUE: Multiplanar, multiecho pulse sequences of the brain and surrounding structures were obtained without intravenous contrast. COMPARISON:  2018 FINDINGS: Brain: There is no acute infarction or intracranial hemorrhage. Prominence of the ventricles and sulci reflects mild parenchymal volume loss, increased since 2018 and primarily involving the central white  matter, noting relative ventricular prominence. Patchy foci of T2 hyperintensity in the supratentorial white matter is nonspecific but probably reflects mild chronic microvascular ischemic changes and sequelae of small infarcts on prior study. 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 Stable appearance of the hippocampi. There is no parenchymal mass, significant mass effect, or edema. There is no hydrocephalus or extra-axial fluid collection. Vascular: Major vessel flow voids at the skull base are preserved. Skull and upper cervical spine: Normal marrow signal is preserved. Sinuses/Orbits: Trace mucosal thickening.  Retained secretions within the posterior nasal cavity and pharynx. Orbits are unremarkable. Other: Sella is unremarkable.  Mastoid air cells are clear. IMPRESSION: No evidence of recent infarction, hemorrhage, or mass. Mild cerebral volume loss and chronic microvascular ischemic changes. Electronically Signed   By: Guadlupe Spanish M.D.   On: 09/04/2019 07:30   CT Code Stroke Cerebral Perfusion with contrast  Result Date: 09/04/2019 CLINICAL DATA:  Aphasia EXAM: CT ANGIOGRAPHY HEAD AND NECK CT PERFUSION BRAIN TECHNIQUE: Multidetector CT imaging of the head and neck was performed using the standard protocol during bolus administration of intravenous contrast. Multiplanar CT image reconstructions and MIPs were obtained to evaluate the vascular anatomy. Carotid stenosis measurements (when applicable) are obtained utilizing NASCET criteria, using the distal internal carotid diameter as the denominator. Multiphase CT imaging of the brain was performed following IV bolus contrast injection. Subsequent parametric perfusion maps were calculated using RAPID software. CONTRAST:  OMNIPAQUE IOHEXOL 350 MG/ML SOLN COMPARISON:  None. FINDINGS: CTA NECK FINDINGS SKELETON: There is no bony spinal canal stenosis. No lytic or blastic lesion. OTHER NECK: Normal pharynx, larynx and major salivary glands. No cervical lymphadenopathy. Unremarkable thyroid gland. UPPER CHEST: No pneumothorax or pleural effusion. No nodules or masses. AORTIC ARCH: There is no calcific atherosclerosis of the aortic arch. There is no aneurysm, dissection or hemodynamically significant stenosis of the visualized portion of the aorta. Conventional 3 vessel aortic branching pattern. The visualized proximal subclavian arteries are widely patent. RIGHT CAROTID SYSTEM: Normal without aneurysm, dissection or stenosis. LEFT CAROTID SYSTEM: Normal without aneurysm, dissection or stenosis. VERTEBRAL ARTERIES: Left dominant configuration. Both origins are  clearly patent. There is no dissection, occlusion or flow-limiting stenosis to the skull base (V1-V3 segments). CTA HEAD FINDINGS POSTERIOR CIRCULATION: --Vertebral arteries: Normal V4 segments. --Inferior cerebellar arteries: Normal. --Basilar artery: Normal. --Superior cerebellar arteries: Normal. --Posterior cerebral arteries (PCA): Normal. ANTERIOR CIRCULATION: --Intracranial internal carotid arteries: Normal. --Anterior cerebral arteries (ACA): Normal. Both A1 segments are present. Patent anterior communicating artery (a-comm). --Middle cerebral arteries (MCA): Normal. VENOUS SINUSES: As permitted by contrast timing, patent. ANATOMIC VARIANTS: None Review of the MIP images confirms the above findings. CT Brain Perfusion Findings: ASPECTS: 10 CBF (<30%) Volume: 0mL Perfusion (Tmax>6.0s) volume: 27mL Mismatch Volume: 27mL Infarction Location:The areas of ischemia calculated by the RAPID software do not conform to any coherent pattern and are likely artifacts caused by patient motion during the acquisition. IMPRESSION: 1. No emergent large vessel occlusion or high-grade stenosis. 2. Motion-degraded CT perfusion scan. Areas of calculated ischemia are likely artifactual. Electronically Signed   By: Deatra Robinson M.D.   On: 09/04/2019 05:58   DG CHEST PORT 1 VIEW  Result Date: 09/08/2019 CLINICAL DATA:  Respiratory failure EXAM: PORTABLE CHEST 1 VIEW COMPARISON:  09/06/2019 FINDINGS: Endotracheal tube terminates 3.6 cm superior to the carina. Enteric tube courses below the diaphragm with distal tip and side hole beyond the inferior margin  of the film. Stable cardiomegaly. Pulmonary vascular congestion. Improving aeration of the bilateral lung fields. Suspect small right pleural effusion. No pneumothorax. IMPRESSION: 1. Improving aeration of the bilateral lung fields. 2. Stable cardiomegaly and pulmonary vascular congestion. 3. Suspect small right pleural effusion. Electronically Signed   By: Duanne GuessNicholas  Plundo D.O.    On: 09/08/2019 14:09   DG Chest Port 1 View  Result Date: 09/06/2019 CLINICAL DATA:  Acute respiratory failure with hypoxemia EXAM: PORTABLE CHEST 1 VIEW COMPARISON:  Yesterday FINDINGS: Endotracheal tube tip is just below the clavicular heads. The enteric tube reaches the stomach. Cardiopericardial enlargement. Increasing bilateral pulmonary infiltrate which may be accentuated by overlapping artifact. No visible pneumothorax. IMPRESSION: 1. Unremarkable hardware positioning. 2. Cardiomegaly and worsening pulmonary opacity. Electronically Signed   By: Marnee SpringJonathon  Watts M.D.   On: 09/06/2019 05:04   DG Chest Port 1 View  Result Date: 09/05/2019 CLINICAL DATA:  Hypoxia EXAM: PORTABLE CHEST 1 VIEW COMPARISON:  Sep 04, 2019 FINDINGS: Endotracheal tube tip is 7.3 cm above the carina. Nasogastric tube tip and side port are below the diaphragm. No pneumothorax. There has been interval clearing of edema compared to 1 day prior. Currently the lungs are clear. Heart is enlarged with pulmonary vascularity normal. No adenopathy. No bone lesions. IMPRESSION: Tube positions as described without pneumothorax. Stable cardiomegaly. Lungs now clear. Electronically Signed   By: Bretta BangWilliam  Woodruff III M.D.   On: 09/05/2019 08:07   DG Chest Portable 1 View  Result Date: 09/04/2019 CLINICAL DATA:  Intubated EXAM: PORTABLE CHEST 1 VIEW COMPARISON:  12/15/2016 chest radiograph. FINDINGS: Endotracheal tube tip is 2.6 cm above the carina. Enteric tube enters stomach with the tip not seen on this image. Stable cardiomediastinal silhouette with mild cardiomegaly. No pneumothorax. No pleural effusion. Moderate pulmonary edema. IMPRESSION: 1. Well-positioned endotracheal and enteric tubes. 2. Stable mild cardiomegaly with moderate pulmonary edema, suggesting congestive heart failure. Electronically Signed   By: Delbert PhenixJason A Poff M.D.   On: 09/04/2019 06:17   DG Abd Portable 1V  Result Date: 09/04/2019 CLINICAL DATA:  Enteric tube  placement EXAM: PORTABLE ABDOMEN - 1 VIEW COMPARISON:  None. FINDINGS: Enteric tube terminates in the body of the stomach. No dilated small bowel loops. Mild colonic stool. No evidence of pneumatosis or pneumoperitoneum. IMPRESSION: Enteric tube terminates in the body of the stomach. Electronically Signed   By: Delbert PhenixJason A Poff M.D.   On: 09/04/2019 06:16   DG Swallowing Func-Speech Pathology  Result Date: 09/12/2019 Objective Swallowing Evaluation: Type of Study: Bedside Swallow Evaluation  Patient Details Name: Marianna FussShuron J Loeza MRN: [id number] Date of Birth: 07/06/1980 Today's Date: 09/12/2019 Time: SLP Start Time (ACUTE ONLY): 0841 -SLP Stop Time (ACUTE ONLY): 0856 SLP Time Calculation (min) (ACUTE ONLY): 15 min Past Medical History: Past Medical History: Diagnosis Date . Chronic kidney disease  . Diabetes mellitus without complication (HCC)  . Elevated serum cholesterol 04/2019 . Elevated serum creatinine 04/2019 . History of seizures  . Hyperglycemia  . Hypertension  . Hypertensive urgency 04/2019 . Hypokalemia  . Seizures (HCC) 03/2017  x 2  . Stroke (HCC) 03/2017 . Substance abuse (HCC)  . Vitamin D deficiency 04/2019 Past Surgical History: No past surgical history on file. HPI: Marianna FussShuron J Pargas is a 39 y.o. male with a history of substance abuse, DM, HTN , seizures in the setting of hyperglycemia, and previous stroke, who presented to the ED 5/30 early am with seizures, hyperglycemia and HTN. CT and MRI Head were negative for stroke. CTA was negative  for PE. CXR 6/3 showed improving B aeration. Pt was intubated 5/30-6/5 with self extubation.  Subjective: Pt awake, alert, pleasant, participative. Family/visitor arrived at end of session Assessment / Plan / Recommendation CHL IP CLINICAL IMPRESSIONS 09/12/2019 Clinical Impression Pt's oral and pharyngeal swallow strength and coordination for effective and safe swallowing was within normal limits. He performed several throat clears that were also noted during bedside  assessment without aspiration or significant penetration during MBS. Trace, flash penetration x 2 that is within normal limits and should not result in sensation. SLP will upgrade liquids to thin, continue regular texture, pills with thin. Educated pt to consume small sips and bites. ST will follow up briefly.        SLP Visit Diagnosis Dysphagia, unspecified (R13.10) Attention and concentration deficit following -- Frontal lobe and executive function deficit following -- Impact on safety and function Mild aspiration risk   CHL IP TREATMENT RECOMMENDATION 09/12/2019 Treatment Recommendations Therapy as outlined in treatment plan below   Prognosis 09/12/2019 Prognosis for Safe Diet Advancement Good Barriers to Reach Goals -- Barriers/Prognosis Comment -- CHL IP DIET RECOMMENDATION 09/12/2019 SLP Diet Recommendations Regular solids;Thin liquid Liquid Administration via Cup;Straw Medication Administration Whole meds with liquid Compensations Slow rate;Small sips/bites Postural Changes Seated upright at 90 degrees   CHL IP OTHER RECOMMENDATIONS 09/12/2019 Recommended Consults -- Oral Care Recommendations Oral care BID Other Recommendations --   CHL IP FOLLOW UP RECOMMENDATIONS 09/12/2019 Follow up Recommendations None   CHL IP FREQUENCY AND DURATION 09/12/2019 Speech Therapy Frequency (ACUTE ONLY) min 2x/week Treatment Duration 2 weeks      CHL IP ORAL PHASE 09/12/2019 Oral Phase WFL Oral - Pudding Teaspoon -- Oral - Pudding Cup -- Oral - Honey Teaspoon -- Oral - Honey Cup -- Oral - Nectar Teaspoon -- Oral - Nectar Cup -- Oral - Nectar Straw -- Oral - Thin Teaspoon -- Oral - Thin Cup -- Oral - Thin Straw -- Oral - Puree -- Oral - Mech Soft -- Oral - Regular -- Oral - Multi-Consistency -- Oral - Pill -- Oral Phase - Comment --  CHL IP PHARYNGEAL PHASE 09/12/2019 Pharyngeal Phase WFL Pharyngeal- Pudding Teaspoon -- Pharyngeal -- Pharyngeal- Pudding Cup -- Pharyngeal -- Pharyngeal- Honey Teaspoon -- Pharyngeal -- Pharyngeal- Honey Cup --  Pharyngeal -- Pharyngeal- Nectar Teaspoon -- Pharyngeal -- Pharyngeal- Nectar Cup -- Pharyngeal -- Pharyngeal- Nectar Straw -- Pharyngeal -- Pharyngeal- Thin Teaspoon -- Pharyngeal -- Pharyngeal- Thin Cup -- Pharyngeal -- Pharyngeal- Thin Straw -- Pharyngeal -- Pharyngeal- Puree -- Pharyngeal -- Pharyngeal- Mechanical Soft -- Pharyngeal -- Pharyngeal- Regular -- Pharyngeal -- Pharyngeal- Multi-consistency -- Pharyngeal -- Pharyngeal- Pill -- Pharyngeal -- Pharyngeal Comment --  CHL IP CERVICAL ESOPHAGEAL PHASE 09/12/2019 Cervical Esophageal Phase WFL Pudding Teaspoon -- Pudding Cup -- Honey Teaspoon -- Honey Cup -- Nectar Teaspoon -- Nectar Cup -- Nectar Straw -- Thin Teaspoon -- Thin Cup -- Thin Straw -- Puree -- Mechanical Soft -- Regular -- Multi-consistency -- Pill -- Cervical Esophageal Comment -- Royce Macadamia 09/12/2019, 11:56 AM Breck Coons Lonell [employer] administrator Pager [phone] Office [phone]              CT ANGIO CHEST AORTA W/CM & OR WO/CM  Result Date: 09/04/2019 CLINICAL DATA:  Aphasia.  Code stroke. EXAM: CT ANGIOGRAPHY CHEST WITH CONTRAST TECHNIQUE: Multidetector CT imaging of the chest was performed using the standard protocol during bolus administration of intravenous contrast. Multiplanar CT image reconstructions and MIPs were obtained to evaluate the vascular anatomy.  CONTRAST:  120mL OMNIPAQUE IOHEXOL 350 MG/ML SOLN COMPARISON:  12/15/2016 chest radiograph.  03/22/2017 chest CT. FINDINGS: Cardiovascular: The study is high quality for the evaluation of pulmonary embolism. There are no filling defects in the central, lobar, segmental or subsegmental pulmonary artery branches to suggest acute pulmonary embolism. Normal course and caliber of the thoracic aorta. Dilated main pulmonary artery (3.8 cm diameter). Mild cardiomegaly. No significant pericardial fluid/thickening. Mediastinum/Nodes: No discrete thyroid nodules. Unremarkable esophagus. No pathologically  enlarged axillary, mediastinal or hilar lymph nodes. Lungs/Pleura: No pneumothorax. No pleural effusion. No acute consolidative airspace disease or lung masses. Few scattered small solid right pulmonary nodules, largest 3 mm in peripheral right upper lobe (series 7/image 77), not definitely seen on prior chest CT. Mosaic attenuation throughout both lungs. Upper abdomen: No acute abnormality. Musculoskeletal:  No aggressive appearing focal osseous lesions. Review of the MIP images confirms the above findings. IMPRESSION: 1. No pulmonary embolism. 2. Dilated main pulmonary artery, suggesting pulmonary arterial hypertension. 3. Mild cardiomegaly. 4. Mosaic attenuation throughout both lungs, nonspecific, probably due to mosaic perfusion from pulmonary vascular disease. 5. Right pulmonary nodules, largest 3 mm. No follow-up needed if patient is low-risk (and has no known or suspected primary neoplasm). Non-contrast chest CT can be considered in 12 months if patient is high-risk. This recommendation follows the consensus statement: Guidelines for Management of Incidental Pulmonary Nodules Detected on CT Images: From the Fleischner Society 2017; Radiology 2017; 284:228-243. Electronically Signed   By: Ilona Sorrel M.D.   On: 09/04/2019 05:56   EEG LTVM - Continuous Bedside W/ Video Includes Portable EEG Read  Result Date: 09/05/2019 Lora Havens, MD     09/06/2019  9:16 AM Patient Name: MAURIE OLESEN MRN: [id number] Epilepsy Attending: Lora Havens Referring Physician/Provider: Dr. Kathrynn Speed Duration: 09/04/2019 2119 to 09/05/2019 0937 Patient history: 39 year old male with history of seizures in the setting of hyperglycemia presented with recurrent seizures in the setting of hyperglycemia and medication noncompliance.  EEG to evaluate for seizures. Level of alertness: Comatose AEDs during EEG study: Keppra, propofol Technical aspects: This EEG study was done with scalp electrodes positioned according to  the 10-20 International system of electrode placement. Electrical activity was acquired at a sampling rate of 500Hz  and reviewed with a high frequency filter of 70Hz  and a low frequency filter of 1Hz . EEG data were recorded continuously and digitally stored. Description:  EEG showed continuous generalized 3-6 Hz theta-delta slowing admixed with15 to 18 Hz beta activity with irregular morphology distributed symmetrically and diffusely. Hyperventilation and photic stimulation were not performed.   ABNORMALITY -Excessive beta, generalized -Continuous slow, generalized IMPRESSION: This study is suggestive of severe diffuse encephalopathy, nonspecific etiology but likely related to sedation. No seizures or epileptiform discharges were seen throughout the recording. Lora Havens   ECHOCARDIOGRAM COMPLETE  Result Date: 09/04/2019    ECHOCARDIOGRAM REPORT   Patient Name:   AMRO WINEBARGER Date of Exam: 09/04/2019 Medical Rec #:  [id number]       Height:       73.0 in Accession #:    [id number]      Weight:       237.4 lb Date of Birth:  1980/04/12       BSA:          2.314 m Patient Age:    49 years        BP:           129/90 mmHg Patient Gender: M  HR:           80 bpm. Exam Location:  Inpatient Procedure: 2D Echo Indications:    Cardiomyopathy-Hypertrophic 425.11 / I42.  History:        Patient has prior history of Echocardiogram examinations, most                 recent 03/22/2017. Stroke; Risk Factors:Hypertension, Diabetes                 and Former Smoker. Seizures, Acute Renal Failure, History of                 substance abuse.  Sonographer:    Jeryl Columbia Referring Phys: [id number] SARAH F GROCE IMPRESSIONS  1. Left ventricular ejection fraction, by estimation, is 50 to 55%. The left ventricle has low normal function. The left ventricle has no regional wall motion abnormalities. There is severe left ventricular hypertrophy consistent with hypertrophic cardiomyopathy. Left ventricular diastolic  parameters are consistent with Grade II diastolic dysfunction (pseudonormalization).  2. Right ventricular systolic function is normal. The right ventricular size is normal. There is normal pulmonary artery systolic pressure. The estimated right ventricular systolic pressure is 12.1 mmHg.  3. The mitral valve is grossly normal. No evidence of mitral valve regurgitation.  4. The aortic valve is tricuspid. Aortic valve regurgitation is not visualized.  5. The inferior vena cava is normal in size with greater than 50% respiratory variability, suggesting right atrial pressure of 3 mmHg. FINDINGS  Left Ventricle: Left ventricular ejection fraction, by estimation, is 50 to 55%. The left ventricle has low normal function. The left ventricle has no regional wall motion abnormalities. The left ventricular internal cavity size was normal in size. There is severe left ventricular hypertrophy. Left ventricular diastolic parameters are consistent with Grade II diastolic dysfunction (pseudonormalization). Right Ventricle: The right ventricular size is normal. No increase in right ventricular wall thickness. Right ventricular systolic function is normal. There is normal pulmonary artery systolic pressure. The tricuspid regurgitant velocity is 1.51 m/s, and  with an assumed right atrial pressure of 3 mmHg, the estimated right ventricular systolic pressure is 12.1 mmHg. Left Atrium: Left atrial size was normal in size. Right Atrium: Right atrial size was normal in size. Pericardium: There is no evidence of pericardial effusion. Mitral Valve: The mitral valve is grossly normal. No evidence of mitral valve regurgitation. Tricuspid Valve: The tricuspid valve is grossly normal. Tricuspid valve regurgitation is trivial. Aortic Valve: The aortic valve is tricuspid. Aortic valve regurgitation is not visualized. Pulmonic Valve: The pulmonic valve was grossly normal. Pulmonic valve regurgitation is trivial. Aorta: The aortic root is normal  in size and structure. Venous: The inferior vena cava is normal in size with greater than 50% respiratory variability, suggesting right atrial pressure of 3 mmHg. IAS/Shunts: No atrial level shunt detected by color flow Doppler.  LEFT VENTRICLE PLAX 2D LVIDd:         3.46 cm  Diastology LVIDs:         2.39 cm  LV e' lateral:   2.89 cm/s LV PW:         1.96 cm  LV E/e' lateral: 31.9 LV IVS:        1.53 cm  LV e' medial:    3.77 cm/s LVOT diam:     2.10 cm  LV E/e' medial:  24.5 LVOT Area:     3.46 cm  RIGHT VENTRICLE RV S prime:     10.40 cm/s TAPSE (M-mode):  1.4 cm LEFT ATRIUM             Index       RIGHT ATRIUM           Index LA diam:        3.30 cm 1.43 cm/m  RA Area:     17.00 cm LA Vol (A2C):   46.2 ml 19.96 ml/m RA Volume:   40.70 ml  17.59 ml/m LA Vol (A4C):   39.7 ml 17.15 ml/m LA Biplane Vol: 43.5 ml 18.80 ml/m   AORTA Ao Root diam: 3.30 cm MITRAL VALVE               TRICUSPID VALVE MV Area (PHT): 1.61 cm    TR Peak grad:   9.1 mmHg MV Decel Time: 470 msec    TR Vmax:        151.00 cm/s MV E velocity: 92.23 cm/s MV A velocity: 57.00 cm/s  SHUNTS MV E/A ratio:  1.62        Systemic Diam: 2.10 cm Nona Dell MD Electronically signed by Nona Dell MD Signature Date/Time: 09/04/2019/12:56:27 PM    Final    CT HEAD CODE STROKE WO CONTRAST  Result Date: 09/04/2019 CLINICAL DATA:  Code stroke.  Aphasia EXAM: CT HEAD WITHOUT CONTRAST TECHNIQUE: Contiguous axial images were obtained from the base of the skull through the vertex without intravenous contrast. COMPARISON:  03/21/2017 FINDINGS: Brain: There is no mass, hemorrhage or extra-axial collection. The size and configuration of the ventricles and extra-axial CSF spaces are normal. The brain parenchyma is normal, without evidence of acute or chronic infarction. Right middle cranial fossa arachnoid cyst. Vascular: No abnormal hyperdensity of the major intracranial arteries or dural venous sinuses. No intracranial atherosclerosis. Skull: The  visualized skull base, calvarium and extracranial soft tissues are normal. Sinuses/Orbits: No fluid levels or advanced mucosal thickening of the visualized paranasal sinuses. No mastoid or middle ear effusion. The orbits are normal. ASPECTS [facility] Stroke Program Early CT Score) - Ganglionic level infarction (caudate, lentiform nuclei, internal capsule, insula, M1-M3 cortex): 7 - Supraganglionic infarction (M4-M6 cortex): 3 Total score (0-10 with 10 being normal): 10 IMPRESSION: 1. No acute intracranial abnormality. 2. ASPECTS is 10. 3. These results were communicated to Dr. Ritta Slot at 5:06 am on 09/04/2019 by text page via the [facility] messaging system. Electronically Signed   By: Deatra Robinson M.D.   On: 09/04/2019 05:08   VAS [place] UPPER EXTREMITY VENOUS DUPLEX  Result Date: 09/11/2019 UPPER VENOUS STUDY  Indications: Swelling Comparison Study: no prior Performing Technologist: Blanch Media RVS  Examination Guidelines: A complete evaluation includes B-mode imaging, spectral Doppler, color Doppler, and power Doppler as needed of all accessible portions of each vessel. Bilateral testing is considered an integral part of a complete examination. Limited examinations for reoccurring indications may be performed as noted.  Left Findings: +----------+------------+---------+-----------+----------+-------+ LEFT      CompressiblePhasicitySpontaneousPropertiesSummary +----------+------------+---------+-----------+----------+-------+ IJV           Full       Yes       Yes                      +----------+------------+---------+-----------+----------+-------+ Subclavian    Full       Yes       Yes                      +----------+------------+---------+-----------+----------+-------+ Axillary      Full  Yes       Yes                      +----------+------------+---------+-----------+----------+-------+ Brachial      Full       Yes       Yes                       +----------+------------+---------+-----------+----------+-------+ Radial        Full                                          +----------+------------+---------+-----------+----------+-------+ Ulnar         Full                                          +----------+------------+---------+-----------+----------+-------+ Cephalic      Full                                          +----------+------------+---------+-----------+----------+-------+ Basilic       Full                                          +----------+------------+---------+-----------+----------+-------+  Summary:  Left: No evidence of deep vein thrombosis in the upper extremity. No evidence of superficial vein thrombosis in the upper extremity.  *See table(s) above for measurements and observations.  Diagnosing physician: Fabienne Bruns MD Electronically signed by Fabienne Bruns MD on 09/11/2019 at 7:18:03 PM.    Final     Microbiology: Recent Results (from the past 240 hour(s))  MRSA PCR Screening     Status: None   Collection Time: 09/04/19  3:28 PM   Specimen: Nasopharyngeal  Result Value Ref Range Status   MRSA by PCR NEGATIVE NEGATIVE Final    Comment:        The GeneXpert MRSA Assay (FDA approved for NASAL specimens only), is one component of a comprehensive MRSA colonization surveillance program. It is not intended to diagnose MRSA infection nor to guide or monitor treatment for MRSA infections. Performed at [facility], [address], [place], [place] [address]   Culture, respiratory (non-expectorated)     Status: None   Collection Time: 09/05/19  3:30 PM   Specimen: Tracheal Aspirate; Respiratory  Result Value Ref Range Status   Specimen Description TRACHEAL ASPIRATE  Final   Special Requests NONE  Final   Gram Stain   Final    RARE WBC PRESENT, PREDOMINANTLY PMN FEW GRAM POSITIVE COCCI Performed at [facility], [address], [place], [place] [address]    Culture FEW  STAPHYLOCOCCUS AUREUS  Final   Report Status 09/07/2019 FINAL  Final   Organism ID, Bacteria STAPHYLOCOCCUS AUREUS  Final      Susceptibility   Staphylococcus aureus - MIC*    CIPROFLOXACIN <=0.5 SENSITIVE Sensitive     ERYTHROMYCIN RESISTANT Resistant     GENTAMICIN <=0.5 SENSITIVE Sensitive     OXACILLIN <=0.25 SENSITIVE Sensitive     TETRACYCLINE <=1 SENSITIVE Sensitive     VANCOMYCIN <=0.5 SENSITIVE  Sensitive     TRIMETH/SULFA <=10 SENSITIVE Sensitive     CLINDAMYCIN RESISTANT Resistant     RIFAMPIN <=0.5 SENSITIVE Sensitive     Inducible Clindamycin POSITIVE Resistant     * FEW STAPHYLOCOCCUS AUREUS  Culture, blood (routine x 2)     Status: None   Collection Time: 09/06/19  1:30 AM   Specimen: BLOOD RIGHT ARM  Result Value Ref Range Status   Specimen Description BLOOD RIGHT ARM  Final   Special Requests   Final    BOTTLES DRAWN AEROBIC AND ANAEROBIC Blood Culture adequate volume   Culture   Final    NO GROWTH 5 DAYS Performed at [facility], [address], [place], [place] [address]    Report Status 09/11/2019 FINAL  Final  Culture, blood (routine x 2)     Status: None   Collection Time: 09/06/19  1:35 AM   Specimen: BLOOD LEFT HAND  Result Value Ref Range Status   Specimen Description BLOOD LEFT HAND  Final   Special Requests   Final    BOTTLES DRAWN AEROBIC ONLY Blood Culture results may not be optimal due to an inadequate volume of blood received in culture bottles   Culture   Final    NO GROWTH 5 DAYS Performed at [facility], [address], [place], [place] [address]    Report Status 09/11/2019 FINAL  Final     Labs: CBC: Recent Labs  Lab 09/09/19 0651 09/10/19 0428 09/11/19 0358 09/12/19 0600 09/13/19 0810  WBC 12.6* 15.3* 14.3* 12.4* 12.6*  NEUTROABS  --   --   --  8.1*  --   HGB 10.6* 12.7* 11.0* 10.7* 12.5*  HCT 36.4* 43.8 38.9* 37.6* 42.7  MCV 84.3 85.2 86.1 85.3 85.2  PLT 267 316 344 433* 522*   Basic Metabolic Panel: Recent  Labs  Lab 09/09/19 0651 09/10/19 0428 09/11/19 0358 09/12/19 0600 09/12/19 1300 09/12/19 1949 09/13/19 0810  NA 150*   < > 154* 152* 145 146* 147*  K 3.3*   < > 3.4* 3.3* 3.5 3.3* 3.4*  CL 110   < > 110 110 106 106 107  CO2 28   < > 29 33* 28 28 27   GLUCOSE 167*   < > 128* 160* 386* 235* 242*  BUN 48*   < > 40* 34* 33* 31* 31*  CREATININE 1.98*   < > 1.63* 1.67* 1.75* 1.70* 1.61*  CALCIUM 9.1   < > 10.0 9.6 9.5 9.3 9.5  MG 2.1  --   --   --   --   --  1.9  PHOS 4.0  --   --   --   --   --   --    < > = values in this interval not displayed.   Liver Function Tests: Recent Labs  Lab 09/08/19 0628 09/09/19 0651 09/10/19 0428 09/11/19 0358  AST 285* 96* 110* 126*  ALT 248* 83* 54* 55*  ALKPHOS 67 78 92 107  BILITOT 0.7 0.6 0.5 0.6  PROT 5.5* 6.1* 7.3 6.8  ALBUMIN 2.0* 2.0* 2.3* 2.3*   No results for input(s): LIPASE, AMYLASE in the last 168 hours. No results for input(s): AMMONIA in the last 168 hours. Cardiac Enzymes: No results for input(s): CKTOTAL, CKMB, CKMBINDEX, TROPONINI in the last 168 hours. BNP (last 3 results) Recent Labs    09/04/19 0830  BNP 513.1*   CBG: Recent Labs  Lab 09/12/19 1201 09/12/19 1554  09/12/19 2023 09/13/19 0802 09/13/19 1156  GLUCAP 284* 317* 227* 209* 266*    Time spent: 35 minutes  Signed:  Lynden Oxford  [facility] 09/13/2019 6:53 AM

## 2019-09-21 ENCOUNTER — Other Ambulatory Visit: Payer: Self-pay

## 2019-09-21 ENCOUNTER — Ambulatory Visit (INDEPENDENT_AMBULATORY_CARE_PROVIDER_SITE_OTHER): Payer: Self-pay | Admitting: Nurse Practitioner

## 2019-09-21 ENCOUNTER — Encounter: Payer: Self-pay | Admitting: Nurse Practitioner

## 2019-09-21 VITALS — BP 183/100 | HR 75 | Temp 97.4°F | Ht 69.0 in | Wt 250.0 lb

## 2019-09-21 DIAGNOSIS — Z87898 Personal history of other specified conditions: Secondary | ICD-10-CM

## 2019-09-21 DIAGNOSIS — F1911 Other psychoactive substance abuse, in remission: Secondary | ICD-10-CM

## 2019-09-21 DIAGNOSIS — I10 Essential (primary) hypertension: Secondary | ICD-10-CM

## 2019-09-21 DIAGNOSIS — D649 Anemia, unspecified: Secondary | ICD-10-CM

## 2019-09-21 DIAGNOSIS — E1165 Type 2 diabetes mellitus with hyperglycemia: Secondary | ICD-10-CM

## 2019-09-21 LAB — POCT URINALYSIS DIPSTICK
Bilirubin, UA: NEGATIVE
Blood, UA: NEGATIVE
Glucose, UA: NEGATIVE
Ketones, UA: NEGATIVE
Leukocytes, UA: NEGATIVE
Nitrite, UA: NEGATIVE
Protein, UA: POSITIVE — AB
Spec Grav, UA: 1.015 (ref 1.010–1.025)
Urobilinogen, UA: 0.2 E.U./dL
pH, UA: 7 (ref 5.0–8.0)

## 2019-09-21 NOTE — Progress Notes (Signed)
[place] [place], [place]  [address] Phone:  [phone]   Fax:  [phone]   Established Patient Office Visit  Subjective:  Patient ID: Micheal Levy, male    DOB: [birth year]  Age: 39 y.o. MRN: [id number]  CC:  Chief Complaint  Patient presents with  . Hospitalization Follow-up    in hospital for seizures,  hx of dm   . Seizures    HPI Micheal Levy presents for follow up. He  has a past medical history of Chronic kidney disease, Diabetes mellitus without complication ([place]), Elevated serum cholesterol (04/2019), Elevated serum creatinine (04/2019), History of seizures, Hyperglycemia, Hypertension, Hypertensive urgency (04/2019), Hypokalemia, Seizures ([place]) (03/2017), Stroke ([place]) (03/2017), Substance abuse ([place]), and Vitamin D deficiency (04/2019).  He was recently hospitalized on 530/2021 for Altered mental status after having a seizure.  His diagnosis at that time was seizure related to acute metabolic encephalopathy, acute hypercapnic Respiratory failure, aspiration pneumonia, acute kidney injury on chronic kidney disease stage II, Severe hypernatremia, hypokalemia, and uncontrolled type 2 diabetes.  He has a history of seizures and was started on Keppra..  He admits that he has been noncompliant with his current treatment regimen. He admits that "he wants to live. "  Hypertension Patient is here for follow-up of elevated blood pressure. He just started exercising and is adherent to a low-salt diet. Blood pressure is not monitored at home. Cardiac symptoms: none. Patient denies chest pain, dyspnea, irregular heart beat, lower extremity edema and palpitations. Cardiovascular risk factors: diabetes mellitus, dyslipidemia, hypertension, male gender, obesity (BMI >= 30 kg/m2) and sedentary lifestyle. Use of agents associated with hypertension: none. History of target organ damage: chronic kidney disease. He admits that he only restarts taking his  medication. Denies headache, dizziness, visual changes, shortness of breath, dyspnea on exertion, chest pain, nausea, vomiting or any edema.    Diabetes Mellitus Patient presents for follow up of diabetes. Current symptoms include: hyperglycemia. Symptoms have stabilized. Patient denies foot ulcerations, hypoglycemia , nausea, vomiting and weight loss. Evaluation to date has included: fasting blood sugar, fasting lipid panel and hemoglobin A1C.  Home sugars: BGs are high in the evening. Current treatment: Continued insulin which has been unable to assess effectiveness, Continued sulfonylurea which has been unable to assess effectiveness, Continued metformin which has been unable to assess effectiveness, Continued statin which has been unable to assess effectiveness and Continued ACE inhibitor/ARB which has been unable to assess effectiveness.Micheal Levy has not been compliant with his medications. He has not had an eye exam. He denies any visual changes.   Past Medical History:  Diagnosis Date  . Chronic kidney disease   . Diabetes mellitus without complication ([place])   . Elevated serum cholesterol 04/2019  . Elevated serum creatinine 04/2019  . History of seizures   . Hyperglycemia   . Hypertension   . Hypertensive urgency 04/2019  . Hypokalemia   . Seizures ([place]) 03/2017   x 2   . Stroke ([place]) 03/2017  . Substance abuse ([place])   . Vitamin D deficiency 04/2019    History reviewed. No pertinent surgical history.  Family History  Problem Relation Age of Onset  . Diabetes Father   . Heart attack Father   . Diabetes Sister   . Hypertension Brother     Social History   Socioeconomic History  . Marital status: Single    Spouse name: Not on file  . Number of children: Not on file  .  Years of education: Not on file  . Highest education level: Not on file  Occupational History  . Not on file  Tobacco Use  . Smoking status: Former Smoker    Types: Cigarettes    Quit date: 04/25/1999      Years since quitting: 20.4  . Smokeless tobacco: Never Used  . Tobacco comment: 2001  Vaping Use  . Vaping Use: Never used  Substance and Sexual Activity  . Alcohol use: Yes    Comment: occ, none since Dec 2018  . Drug use: Yes    Types: Cocaine, Marijuana    Comment: weed-daily. cocaine- once or twice a month, 05/25/17 avg 1/2 gm daily, no cocaine since Dec 2018   . Sexual activity: Yes  Other Topics Concern  . Not on file  Social History Narrative   ** Merged History Encounter **   05/25/17 Lives with girlfriend   05/25/17 currently out of work   Education- GED   Children- 3    no caffeine use   Social Determinants of Corporate investment banker Strain:   . Difficulty of Paying Living Expenses:   Food Insecurity:   . Worried About Programme researcher, broadcasting/film/video in the Last Year:   . Barista in the Last Year:   Transportation Needs:   . Freight forwarder (Medical):   [name] Lack of Transportation (Non-Medical):   Physical Activity:   . Days of Exercise per Week:   . Minutes of Exercise per Session:   Stress:   . Feeling of Stress :   Social Connections:   . Frequency of Communication with Friends and Family:   . Frequency of Social Gatherings with Friends and Family:   . Attends Religious Services:   . Active Member of Clubs or Organizations:   . Attends Banker Meetings:   [name] Marital Status:   Intimate Partner Violence:   . Fear of Current or Ex-Partner:   . Emotionally Abused:   [name] Physically Abused:   . Sexually Abused:     Outpatient Medications Prior to Visit  Medication Sig Dispense Refill  . albuterol (VENTOLIN HFA) 108 (90 Base) MCG/ACT inhaler Inhale 2 puffs into the lungs every 6 (six) hours as needed for wheezing or shortness of breath. 8 g 12  . amLODipine (NORVASC) 10 MG tablet Take 1 tablet (10 mg total) by mouth daily. 30 tablet 3  . aspirin EC 81 MG tablet Take 1 tablet (81 mg total) by mouth daily. 30 tablet 0  . atorvastatin  (LIPITOR) 10 MG tablet Take 1 tablet (10 mg total) by mouth daily. 30 tablet 6  . Blood Glucose Monitoring Suppl (TRUE METRIX AIR GLUCOSE METER) DEVI 1 each by Does not apply route 4 (four) times daily -  before meals and at bedtime. 1 each 0  . feeding supplement, GLUCERNA SHAKE, (GLUCERNA SHAKE) LIQD Take 237 mLs by mouth 3 (three) times daily between meals. 10000 mL 0  . furosemide (LASIX) 20 MG tablet Take 1 tablet (20 mg total) by mouth daily for 10 days. 10 tablet 0  . glipiZIDE (GLUCOTROL XL) 10 MG 24 hr tablet Take 1 tablet (10 mg total) by mouth daily with breakfast. 30 tablet 3  . glucose blood (TRUE METRIX BLOOD GLUCOSE TEST) test strip Use as instructed 100 each 12  . insulin glargine (LANTUS SOLOSTAR) 100 UNIT/ML Solostar Pen Inject 30 Units into the skin daily at 10 pm. 5 pen 0  . Insulin Pen  Needle (PEN NEEDLES) 31G X 6 MM MISC 1 each by Does not apply route at bedtime. 100 each 12  . Lancets MISC 1 each by Does not apply route 4 (four) times daily -  before meals and at bedtime. 100 each 5  . metFORMIN (GLUCOPHAGE) 1000 MG tablet Take 1 tablet (1,000 mg total) by mouth 2 (two) times daily with a meal. 180 tablet 3  . metoprolol tartrate (LOPRESSOR) 50 MG tablet Take 1 tablet (50 mg total) by mouth 2 (two) times daily. 60 tablet 0  . levETIRAcetam (KEPPRA) 1000 MG tablet Take 1 tablet (1,000 mg total) by mouth 2 (two) times daily. 60 tablet 0  . albuterol (PROVENTIL) (2.5 MG/3ML) 0.083% nebulizer solution Take 3 mLs (2.5 mg total) by nebulization every 6 (six) hours as needed for wheezing or shortness of breath. (Patient not taking: Reported on 09/21/2019) 75 mL 12  . hydrALAZINE (APRESOLINE) 100 MG tablet Take 1 tablet (100 mg total) by mouth 3 (three) times daily. 90 tablet 0  . INSULIN SYRINGE .5CC/29G (B-D INS SYR ULTRAFINE .5CC/29G) 29G X 1/2" 0.5 ML MISC 1 each by Does not apply route at bedtime. (Patient not taking: Reported on 04/21/2019) 100 each 5  . isosorbide mononitrate  (IMDUR) 30 MG 24 hr tablet Take 1 tablet (30 mg total) by mouth daily. 30 tablet 0  . pantoprazole (PROTONIX) 40 MG tablet Take 1 tablet (40 mg total) by mouth daily. 30 tablet 0  . polyethylene glycol (MIRALAX / GLYCOLAX) 17 g packet Take 17 g by mouth daily. (Patient not taking: Reported on 09/21/2019) 14 each 0  . Vitamin D, Ergocalciferol, (DRISDOL) 1.25 MG (50000 UNIT) CAPS capsule Take 1 capsule (50,000 Units total) by mouth every 7 (seven) days. (Patient not taking: Reported on 09/05/2019) 5 capsule 6   No facility-administered medications prior to visit.    No Known Allergies  ROS Review of Systems    Objective:    Physical Exam Vitals reviewed.  Constitutional:      Appearance: He is obese.  Cardiovascular:     Rate and Rhythm: Normal rate and regular rhythm.     Pulses: Normal pulses.  Pulmonary:     Effort: Pulmonary effort is normal.     Breath sounds: Normal breath sounds.  Abdominal:     General: Bowel sounds are normal.  Musculoskeletal:     Cervical back: Normal range of motion.     Right lower leg: Edema present.     Left lower leg: Edema present.     Comments: Trace to 1+ pitting  Skin:    General: Skin is dry.     Capillary Refill: Capillary refill takes less than 2 seconds.  Neurological:     Mental Status: He is alert and oriented to person, place, and time.  Psychiatric:        Mood and Affect: Mood normal.        Behavior: Behavior normal.        Thought Content: Thought content normal.        Judgment: Judgment normal.     BP (!) 183/100   Pulse 75   Temp (!) 97.4 F (36.3 C) (Temporal)   Ht 5\' 9"  (1.753 m)   Wt 250 lb (113.4 kg)   SpO2 100%   BMI 36.92 kg/m  Wt Readings from Last 3 Encounters:  09/21/19 250 lb (113.4 kg)  09/13/19 235 lb 14.3 oz (107 kg)  04/19/19 236 lb 9.6 oz (107.3 kg)  Health Maintenance Due  Topic Date Due  . COVID-19 Vaccine (1) Never done  . FOOT EXAM  04/02/2018  . OPHTHALMOLOGY EXAM  08/05/2018     There are no preventive care reminders to display for this patient.  Lab Results  Component Value Date   TSH 1.910 04/19/2019   Lab Results  Component Value Date   WBC 7.5 09/21/2019   HGB 11.2 (L) 09/21/2019   HCT 36.5 (L) 09/21/2019   MCV 79 09/21/2019   PLT 542 (H) 09/21/2019   Lab Results  Component Value Date   NA 138 09/21/2019   K 3.8 09/21/2019   CO2 27 09/13/2019   GLUCOSE 155 (H) 09/21/2019   BUN 12 09/21/2019   CREATININE 1.59 (H) 09/21/2019   BILITOT 0.3 09/21/2019   ALKPHOS 94 09/21/2019   AST 20 09/21/2019   ALT 55 (H) 09/11/2019   PROT 6.3 09/21/2019   ALBUMIN 3.5 (L) 09/21/2019   CALCIUM 9.2 09/21/2019   ANIONGAP 13 09/13/2019   Lab Results  Component Value Date   CHOL 141 04/19/2019   Lab Results  Component Value Date   HDL 35 (L) 04/19/2019   Lab Results  Component Value Date   LDLCALC 71 04/19/2019   Lab Results  Component Value Date   TRIG 197 (H) 09/05/2019   Lab Results  Component Value Date   CHOLHDL 4.0 04/19/2019   Lab Results  Component Value Date   HGBA1C 11.4 (H) 09/04/2019      Assessment & Plan:   Problem List Items Addressed This Visit      Cardiovascular and Mediastinum   Essential hypertension - Primary   Relevant Orders   POCT Urinalysis Dipstick (Completed) Clonidine 0.2 mg completed in the office today to treat his blood pressure 187 /113 Long discussion with patient regarding uncontrolled hypertension and compliance. Continue with current regimen amlodipine 10 mg daily, metoprolol 50 mg twice daily     Endocrine   Uncontrolled type 2 diabetes mellitus with hyperglycemia (HCC)   Relevant Orders   CBC with Differential/Platelet (Completed)   Comp. Metabolic Panel (12) (Completed) Continue with blood glucose monitoring twice daily Glipizide 10 mg, Lantus 30 units daily, Metformin 1000 mg twice daily Lifestyle modification with diet and exercise     Other   History of seizure Encourage patient to  follow-up with neurology as directed Continue with Keppra 1000 mg twice daily   History of substance abuse ([facility]CC)    Other Visit Diagnoses    Anemia, unspecified type       Relevant Orders   CBC with Differential/Platelet (Completed)      Meds ordered this encounter  Medications  . levETIRAcetam (KEPPRA) 1000 MG tablet    Sig: Take 1 tablet (1,000 mg total) by mouth 2 (two) times daily.    Dispense:  60 tablet    Refill:  0    Order Specific Question:   Supervising Provider    Answer:   Quentin Angst [id number]    Follow-up: Return in about 6 weeks (around 11/02/2019).    Barbette Merino, NP

## 2019-09-21 NOTE — Patient Instructions (Addendum)
Diabetes Mellitus and Exercise Exercising regularly is important for your overall health, especially when you have diabetes (diabetes mellitus). Exercising is not only about losing weight. It has many other health benefits, such as increasing muscle strength and bone density and reducing body fat and stress. This leads to improved fitness, flexibility, and endurance, all of which result in better overall health. Exercise has additional benefits for people with diabetes, including:  Reducing appetite.  Helping to lower and control blood glucose.  Lowering blood pressure.  Helping to control amounts of fatty substances (lipids) in the blood, such as cholesterol and triglycerides.  Helping the body to respond better to insulin (improving insulin sensitivity).  Reducing how much insulin the body needs.  Decreasing the risk for heart disease by: ? Lowering cholesterol and triglyceride levels. ? Increasing the levels of good cholesterol. ? Lowering blood glucose levels. What is my activity plan? Your health care provider or certified diabetes educator can help you make a plan for the type and frequency of exercise (activity plan) that works for you. Make sure that you:  Do at least 150 minutes of moderate-intensity or vigorous-intensity exercise each week. This could be brisk walking, biking, or water aerobics. ? Do stretching and strength exercises, such as yoga or weightlifting, at least 2 times a week. ? Spread out your activity over at least 3 days of the week.  Get some form of physical activity every day. ? Do not go more than 2 days in a row without some kind of physical activity. ? Avoid being inactive for more than 30 minutes at a time. Take frequent breaks to walk or stretch.  Choose a type of exercise or activity that you enjoy, and set realistic goals.  Start slowly, and gradually increase the intensity of your exercise over time. What do I need to know about managing my  diabetes?   Check your blood glucose before and after exercising. ? If your blood glucose is 240 mg/dL (13.3 mmol/L) or higher before you exercise, check your urine for ketones. If you have ketones in your urine, do not exercise until your blood glucose returns to normal. ? If your blood glucose is 100 mg/dL (5.6 mmol/L) or lower, eat a snack containing 15-20 grams of carbohydrate. Check your blood glucose 15 minutes after the snack to make sure that your level is above 100 mg/dL (5.6 mmol/L) before you start your exercise.  Know the symptoms of low blood glucose (hypoglycemia) and how to treat it. Your risk for hypoglycemia increases during and after exercise. Common symptoms of hypoglycemia can include: ? Hunger. ? Anxiety. ? Sweating and feeling clammy. ? Confusion. ? Dizziness or feeling light-headed. ? Increased heart rate or palpitations. ? Blurry vision. ? Tingling or numbness around the mouth, lips, or tongue. ? Tremors or shakes. ? Irritability.  Keep a rapid-acting carbohydrate snack available before, during, and after exercise to help prevent or treat hypoglycemia.  Avoid injecting insulin into areas of the body that are going to be exercised. For example, avoid injecting insulin into: ? The arms, when playing tennis. ? The legs, when jogging.  Keep records of your exercise habits. Doing this can help you and your health care provider adjust your diabetes management plan as needed. Write down: ? Food that you eat before and after you exercise. ? Blood glucose levels before and after you exercise. ? The type and amount of exercise you have done. ? When your insulin is expected to peak, if you use   insulin. Avoid exercising at times when your insulin is peaking.  When you start a new exercise or activity, work with your health care provider to make sure the activity is safe for you, and to adjust your insulin, medicines, or food intake as needed.  Drink plenty of water while  you exercise to prevent dehydration or heat stroke. Drink enough fluid to keep your urine clear or pale yellow. Summary  Exercising regularly is important for your overall health, especially when you have diabetes (diabetes mellitus).  Exercising has many health benefits, such as increasing muscle strength and bone density and reducing body fat and stress.  Your health care provider or certified diabetes educator can help you make a plan for the type and frequency of exercise (activity plan) that works for you.  When you start a new exercise or activity, work with your health care provider to make sure the activity is safe for you, and to adjust your insulin, medicines, or food intake as needed. This information is not intended to replace advice given to you by your health care provider. Make sure you discuss any questions you have with your health care provider. Document Revised: 10/16/2016 Document Reviewed: 09/03/2015 Elsevier Patient Education  2020 [employer]  

## 2019-09-22 LAB — CBC WITH DIFFERENTIAL/PLATELET
Basophils Absolute: 0.1 10*3/uL (ref 0.0–0.2)
Basos: 1 %
EOS (ABSOLUTE): 0.2 10*3/uL (ref 0.0–0.4)
Eos: 3 %
Hematocrit: 36.5 % — ABNORMAL LOW (ref 37.5–51.0)
Hemoglobin: 11.2 g/dL — ABNORMAL LOW (ref 13.0–17.7)
Immature Grans (Abs): 0 10*3/uL (ref 0.0–0.1)
Immature Granulocytes: 1 %
Lymphocytes Absolute: 1.7 10*3/uL (ref 0.7–3.1)
Lymphs: 23 %
MCH: 24.2 pg — ABNORMAL LOW (ref 26.6–33.0)
MCHC: 30.7 g/dL — ABNORMAL LOW (ref 31.5–35.7)
MCV: 79 fL (ref 79–97)
Monocytes Absolute: 0.7 10*3/uL (ref 0.1–0.9)
Monocytes: 9 %
Neutrophils Absolute: 4.9 10*3/uL (ref 1.4–7.0)
Neutrophils: 63 %
Platelets: 542 10*3/uL — ABNORMAL HIGH (ref 150–450)
RBC: 4.63 x10E6/uL (ref 4.14–5.80)
RDW: 16.4 % — ABNORMAL HIGH (ref 11.6–15.4)
WBC: 7.5 10*3/uL (ref 3.4–10.8)

## 2019-09-22 LAB — COMP. METABOLIC PANEL (12)
AST: 20 IU/L (ref 0–40)
Albumin/Globulin Ratio: 1.3 (ref 1.2–2.2)
Albumin: 3.5 g/dL — ABNORMAL LOW (ref 4.0–5.0)
Alkaline Phosphatase: 94 IU/L (ref 48–121)
BUN/Creatinine Ratio: 8 — ABNORMAL LOW (ref 9–20)
BUN: 12 mg/dL (ref 6–24)
Bilirubin Total: 0.3 mg/dL (ref 0.0–1.2)
Calcium: 9.2 mg/dL (ref 8.7–10.2)
Chloride: 100 mmol/L (ref 96–106)
Creatinine, Ser: 1.59 mg/dL — ABNORMAL HIGH (ref 0.76–1.27)
GFR calc Af Amer: 61 mL/min/{1.73_m2} (ref >59)
GFR calc non Af Amer: 53 mL/min/{1.73_m2} — ABNORMAL LOW (ref >59)
Globulin, Total: 2.8 g/dL (ref 1.5–4.5)
Glucose: 155 mg/dL — ABNORMAL HIGH (ref 65–99)
Potassium: 3.8 mmol/L (ref 3.5–5.2)
Sodium: 138 mmol/L (ref 134–144)
Total Protein: 6.3 g/dL (ref 6.0–8.5)

## 2019-09-22 MED ORDER — LEVETIRACETAM 1000 MG PO TABS: 1000.0000 mg | ORAL_TABLET | Freq: Two times a day (BID) | ORAL | 0 refills | Status: DC

## 2019-09-23 ENCOUNTER — Ambulatory Visit: Payer: Self-pay

## 2019-10-12 ENCOUNTER — Other Ambulatory Visit: Payer: Self-pay | Admitting: Nurse Practitioner

## 2019-10-12 MED ORDER — PANTOPRAZOLE SODIUM 40 MG PO TBEC: 40.0000 mg | DELAYED_RELEASE_TABLET | Freq: Every day | ORAL | 1 refills | Status: DC

## 2019-10-12 MED FILL — METOPROLOL TARTRATE 50 MG T: 50 | 30 days supply | Qty: 60 | Fill #1

## 2019-10-12 MED FILL — PANTOPRAZOLE SOD DR 40 MG T: 40 | 30 days supply | Qty: 30 | Fill #0

## 2019-10-12 MED FILL — metFORMIN HCL 1000 MG TABS: 1000 | 30 days supply | Qty: 60 | Fill #1

## 2019-10-12 MED FILL — levETIRAcetam 1000 MG TABS: 1000 | 30 days supply | Qty: 60 | Fill #0

## 2019-10-12 MED FILL — LANTUS SOLOSTAR 100 UNITS/M: 100 | 20 days supply | Qty: 6 | Fill #1

## 2019-11-02 ENCOUNTER — Ambulatory Visit: Payer: Self-pay | Admitting: Nurse Practitioner

## 2019-11-08 ENCOUNTER — Other Ambulatory Visit: Payer: Self-pay | Admitting: Nurse Practitioner

## 2019-11-08 DIAGNOSIS — Z87898 Personal history of other specified conditions: Secondary | ICD-10-CM

## 2019-11-08 MED FILL — levETIRAcetam 1000 MG TABS: 1000 | 30 days supply | Qty: 60 | Fill #0

## 2019-11-08 MED FILL — metFORMIN HCL 1000 MG TABS: 1000 | 30 days supply | Qty: 60 | Fill #2

## 2019-11-08 MED FILL — METOPROLOL TARTRATE 50 MG T: 50 | 30 days supply | Qty: 60 | Fill #2

## 2019-11-08 MED FILL — PANTOPRAZOLE SOD DR 40 MG T: 40 | 30 days supply | Qty: 30 | Fill #1

## 2019-11-08 MED FILL — hydrALAZINE HCL 100 MG TABS: 100 | 30 days supply | Qty: 90 | Fill #1

## 2019-11-11 ENCOUNTER — Other Ambulatory Visit: Payer: Self-pay

## 2019-11-11 ENCOUNTER — Other Ambulatory Visit: Payer: Self-pay | Admitting: Nurse Practitioner

## 2019-11-11 ENCOUNTER — Encounter: Payer: Self-pay | Admitting: Nurse Practitioner

## 2019-11-11 ENCOUNTER — Ambulatory Visit (INDEPENDENT_AMBULATORY_CARE_PROVIDER_SITE_OTHER): Payer: Self-pay | Admitting: Nurse Practitioner

## 2019-11-11 VITALS — BP 196/120 | HR 81 | Temp 98.6°F | Resp 16 | Ht 68.5 in | Wt 221.0 lb

## 2019-11-11 DIAGNOSIS — R600 Localized edema: Secondary | ICD-10-CM

## 2019-11-11 DIAGNOSIS — I10 Essential (primary) hypertension: Secondary | ICD-10-CM

## 2019-11-11 DIAGNOSIS — D649 Anemia, unspecified: Secondary | ICD-10-CM

## 2019-11-11 DIAGNOSIS — E1165 Type 2 diabetes mellitus with hyperglycemia: Secondary | ICD-10-CM

## 2019-11-11 DIAGNOSIS — Z87898 Personal history of other specified conditions: Secondary | ICD-10-CM

## 2019-11-11 LAB — POCT URINALYSIS DIPSTICK
Blood, UA: NEGATIVE
Glucose, UA: NEGATIVE
Leukocytes, UA: NEGATIVE
Nitrite, UA: NEGATIVE
Protein, UA: POSITIVE — AB
Spec Grav, UA: 1.025 (ref 1.010–1.025)
Urobilinogen, UA: 0.2 E.U./dL
pH, UA: 5.5 (ref 5.0–8.0)

## 2019-11-11 LAB — POCT GLYCOSYLATED HEMOGLOBIN (HGB A1C): Hemoglobin A1C: 6.5 % — AB (ref 4.0–5.6)

## 2019-11-11 MED ORDER — CLONIDINE 0.1 MG/24HR TD PTWK: 0.1000 mg | MEDICATED_PATCH | TRANSDERMAL | 12 refills | Status: DC

## 2019-11-11 MED ORDER — CLONIDINE HCL 0.1 MG PO TABS
0.1000 mg | ORAL_TABLET | Freq: Once | ORAL | Status: AC
  Administered 2019-11-11: 0.1 mg via ORAL

## 2019-11-11 MED ORDER — LEVETIRACETAM 1000 MG PO TABS: 1000.0000 mg | ORAL_TABLET | Freq: Two times a day (BID) | ORAL | 11 refills | Status: DC

## 2019-11-11 MED ORDER — LANTUS SOLOSTAR 100 UNIT/ML ~~LOC~~ SOPN: 30.0000 [IU] | PEN_INJECTOR | Freq: Every day | SUBCUTANEOUS | 11 refills | Status: DC

## 2019-11-11 MED ORDER — CLONIDINE HCL 0.1 MG PO TABS: 0.1000 mg | ORAL_TABLET | Freq: Once | ORAL | Status: DC

## 2019-11-11 MED ORDER — HYDRALAZINE HCL 100 MG PO TABS: 100.0000 mg | ORAL_TABLET | Freq: Three times a day (TID) | ORAL | 0 refills | Status: DC

## 2019-11-11 MED ORDER — CLONIDINE HCL 0.1 MG PO TABS
0.2000 mg | ORAL_TABLET | Freq: Once | ORAL | Status: AC
  Administered 2019-11-11: 0.2 mg via ORAL

## 2019-11-11 MED ORDER — AMLODIPINE BESYLATE 10 MG PO TABS: 10.0000 mg | ORAL_TABLET | Freq: Every day | ORAL | 3 refills | Status: DC

## 2019-11-11 MED FILL — !LANTUS SOLOSTAR 100UNITS/M: 100 | 30 days supply | Qty: 9 | Fill #0

## 2019-11-11 MED FILL — AMLODIPINE BESYLATE 10 MG T: 10 | 30 days supply | Qty: 30 | Fill #0

## 2019-11-11 MED FILL — CLONIDINE 0.1 MG/24HR PTWK: 0.1 | 28 days supply | Qty: 4 | Fill #0

## 2019-11-11 NOTE — Progress Notes (Signed)
[facility] Patient [facility] [address] [place], [place]  [address] Phone:  [phone]   Fax:  [phone]   Established Patient Office Visit  Subjective:  Patient ID: Micheal Levy, male    DOB: Jan 03, 1981  Age: 39 y.o. MRN: [id number]  CC:  Chief Complaint  Patient presents with   Hypertension   Diabetes    HPI Micheal Levy presents for follow up. He  has a past medical history of Chronic kidney disease, Diabetes mellitus without complication (HCC), Elevated serum cholesterol (04/2019), Elevated serum creatinine (04/2019), History of seizures, Hyperglycemia, Hypertension, Hypertensive urgency (04/2019), Hypokalemia, Seizures (HCC) (03/2017), Stroke (HCC) (03/2017), Substance abuse (HCC), and Vitamin D deficiency (04/2019).   Hypertension Patient is here for follow-up of elevated blood pressure. He is exercising and is adherent to a low-salt diet. Blood pressure is not well controlled at home. Cardiac symptoms: lower extremity edema. Patient denies chest pain, chest pressure/discomfort, dyspnea, fatigue, irregular heart beat, palpitations and syncope. Cardiovascular risk factors: diabetes mellitus, dyslipidemia, hypertension, male gender and obesity (BMI >= 30 kg/m2). Use of agents associated with hypertension: none. History of target organ damage: stroke.  Diabetes Mellitus Patient presents for follow up of diabetes. Current symptoms include: paresthesia of the feet and visual disturbances. Symptoms have stabilized. Patient denies foot ulcerations, increased appetite, nausea, polydipsia, polyuria, vomiting and weight loss. Evaluation to date has included: fasting blood sugar, fasting lipid panel, hemoglobin A1C and microalbuminuria.  Home sugars: BGs consistently in an acceptable range. Current treatment: more intensive attention to diet which has been effective, low cholesterol diet which has been effective, increased aerobic exercise which has been effective, Continued  insulin which has been effective, Continued metformin which has been effective and Continued statin which has been effective.  Past Medical History:  Diagnosis Date   Chronic kidney disease    Diabetes mellitus without complication (HCC)    Elevated serum cholesterol 04/2019   Elevated serum creatinine 04/2019   History of seizures    Hyperglycemia    Hypertension    Hypertensive urgency 04/2019   Hypokalemia    Seizures (HCC) 03/2017   x 2    Stroke (HCC) 03/2017   Substance abuse (HCC)    Vitamin D deficiency 04/2019    History reviewed. No pertinent surgical history.  Family History  Problem Relation Age of Onset   Diabetes Father    Heart attack Father    Diabetes Sister    Hypertension Brother     Social History   Socioeconomic History   Marital status: Single    Spouse name: Not on file   Number of children: Not on file   Years of education: Not on file   Highest education level: Not on file  Occupational History   Not on file  Tobacco Use   Smoking status: Former Smoker    Types: Cigarettes    Quit date: 04/25/1999    Years since quitting: 20.5   Smokeless tobacco: Never Used   Tobacco comment: 2001  Vaping Use   Vaping Use: Never used  Substance and Sexual Activity   Alcohol use: Yes    Comment: occ, none since Dec 2018   Drug use: Yes    Types: Cocaine, Marijuana    Comment: weed-daily. cocaine- once or twice a month, 05/25/17 avg 1/2 gm daily, no cocaine since Dec 2018    Sexual activity: Yes  Other Topics Concern   Not on file  Social History Narrative   **  Merged History Encounter **   05/25/17 Lives with girlfriend   05/25/17 currently out of work   Education- GED   Children- 3    no caffeine use   Social Determinants of Corporate investment banker Strain:    Difficulty of Paying Living Expenses:   Food Insecurity:    Worried About Programme researcher, broadcasting/film/video in the Last Year:    Barista in the Last  Year:   Transportation Needs:    Freight forwarder (Medical):    Lack of Transportation (Non-Medical):   Physical Activity:    Days of Exercise per Week:    Minutes of Exercise per Session:   Stress:    Feeling of Stress :   Social Connections:    Frequency of Communication with Friends and Family:    Frequency of Social Gatherings with Friends and Family:    Attends Religious Services:    Active Member of Clubs or Organizations:    Attends Engineer, structural:    Marital Status:   Intimate Partner Violence:    Fear of Current or Ex-Partner:    Emotionally Abused:    Physically Abused:    Sexually Abused:     Outpatient Medications Prior to Visit  Medication Sig Dispense Refill   albuterol (VENTOLIN HFA) 108 (90 Base) MCG/ACT inhaler Inhale 2 puffs into the lungs every 6 (six) hours as needed for wheezing or shortness of breath. 8 g 12   aspirin EC 81 MG tablet Take 1 tablet (81 mg total) by mouth daily. 30 tablet 0   atorvastatin (LIPITOR) 10 MG tablet Take 1 tablet (10 mg total) by mouth daily. 30 tablet 6   Blood Glucose Monitoring Suppl (TRUE METRIX AIR GLUCOSE METER) DEVI 1 each by Does not apply route 4 (four) times daily -  before meals and at bedtime. 1 each 0   feeding supplement, GLUCERNA SHAKE, (GLUCERNA SHAKE) LIQD Take 237 mLs by mouth 3 (three) times daily between meals. 10000 mL 0   glucose blood (TRUE METRIX BLOOD GLUCOSE TEST) test strip Use as instructed 100 each 12   Insulin Pen Needle (PEN NEEDLES) 31G X 6 MM MISC 1 each by Does not apply route at bedtime. 100 each 12   metFORMIN (GLUCOPHAGE) 1000 MG tablet Take 1 tablet (1,000 mg total) by mouth 2 (two) times daily with a meal. 180 tablet 3   metoprolol tartrate (LOPRESSOR) 50 MG tablet Take 1 tablet (50 mg total) by mouth 2 (two) times daily. 60 tablet 0   amLODipine (NORVASC) 10 MG tablet Take 1 tablet (10 mg total) by mouth daily. 30 tablet 3   hydrALAZINE  (APRESOLINE) 100 MG tablet Take 1 tablet (100 mg total) by mouth 3 (three) times daily. 90 tablet 0   insulin glargine (LANTUS SOLOSTAR) 100 UNIT/ML Solostar Pen Inject 30 Units into the skin daily at 10 pm. 5 pen 0   levETIRAcetam (KEPPRA) 1000 MG tablet TAKE 1 TABLET (1,000 MG TOTAL) BY MOUTH 2 (TWO) TIMES DAILY. 60 tablet 0   albuterol (PROVENTIL) (2.5 MG/3ML) 0.083% nebulizer solution Take 3 mLs (2.5 mg total) by nebulization every 6 (six) hours as needed for wheezing or shortness of breath. (Patient not taking: Reported on 09/21/2019) 75 mL 12   glipiZIDE (GLUCOTROL XL) 10 MG 24 hr tablet Take 1 tablet (10 mg total) by mouth daily with breakfast. (Patient not taking: Reported on 11/11/2019) 30 tablet 3   INSULIN SYRINGE .5CC/29G (B-D INS SYR ULTRAFINE .  5CC/29G) 29G X 1/2" 0.5 ML MISC 1 each by Does not apply route at bedtime. (Patient not taking: Reported on 04/21/2019) 100 each 5   isosorbide mononitrate (IMDUR) 30 MG 24 hr tablet Take 1 tablet (30 mg total) by mouth daily. (Patient not taking: Reported on 11/11/2019) 30 tablet 0   Lancets MISC 1 each by Does not apply route 4 (four) times daily -  before meals and at bedtime. 100 each 5   pantoprazole (PROTONIX) 40 MG tablet Take 1 tablet (40 mg total) by mouth daily. (Patient not taking: Reported on 11/11/2019) 30 tablet 1   polyethylene glycol (MIRALAX / GLYCOLAX) 17 g packet Take 17 g by mouth daily. (Patient not taking: Reported on 09/21/2019) 14 each 0   Vitamin D, Ergocalciferol, (DRISDOL) 1.25 MG (50000 UNIT) CAPS capsule Take 1 capsule (50,000 Units total) by mouth every 7 (seven) days. (Patient not taking: Reported on 09/05/2019) 5 capsule 6   furosemide (LASIX) 20 MG tablet Take 1 tablet (20 mg total) by mouth daily for 10 days. 10 tablet 0   No facility-administered medications prior to visit.    No Known Allergies  ROS Review of Systems  All other systems reviewed and are negative.     Objective:    Physical  Exam Constitutional:      General: He is not in acute distress.    Appearance: He is not ill-appearing or diaphoretic.  HENT:     Head: Normocephalic and atraumatic.     Nose: Nose normal.     Mouth/Throat:     Mouth: Mucous membranes are moist.  Cardiovascular:     Rate and Rhythm: Normal rate and regular rhythm.     Pulses: Normal pulses.     Heart sounds: Normal heart sounds.  Pulmonary:     Effort: Pulmonary effort is normal.     Breath sounds: Normal breath sounds.  Abdominal:     General: Bowel sounds are normal.     Palpations: Abdomen is soft.  Musculoskeletal:        General: Normal range of motion.     Cervical back: Normal range of motion.     Right lower leg: No edema.     Left lower leg: Edema present.  Skin:    General: Skin is warm.     Capillary Refill: Capillary refill takes less than 2 seconds.  Neurological:     General: No focal deficit present.     Mental Status: He is alert. He is disoriented.  Psychiatric:        Mood and Affect: Mood normal.        Behavior: Behavior normal.        Thought Content: Thought content normal.        Judgment: Judgment normal.     BP (!) 196/120 Comment: manually   Pulse 81    Temp 98.6 F (37 C) (Oral)    Resp 16    Ht 5' 8.5" (1.74 m)    Wt 221 lb (100.2 kg)    SpO2 100%    BMI 33.11 kg/m  Wt Readings from Last 3 Encounters:  11/11/19 221 lb (100.2 kg)  09/21/19 250 lb (113.4 kg)  09/13/19 235 lb 14.3 oz (107 kg)     There are no preventive care reminders to display for this patient.  There are no preventive care reminders to display for this patient.  Lab Results  Component Value Date   TSH 1.910 04/19/2019   Lab  Results  Component Value Date   WBC 9.0 11/11/2019   HGB 12.3 (L) 11/11/2019   HCT 40.0 11/11/2019   MCV 76 (L) 11/11/2019   PLT 309 11/11/2019   Lab Results  Component Value Date   NA 141 11/11/2019   K 3.7 11/11/2019   CO2 27 09/13/2019   GLUCOSE 136 (H) 11/11/2019   BUN 20  11/11/2019   CREATININE 1.61 (H) 11/11/2019   BILITOT 0.5 11/11/2019   ALKPHOS 71 11/11/2019   AST 15 11/11/2019   ALT 55 (H) 09/11/2019   PROT 6.8 11/11/2019   ALBUMIN 4.1 11/11/2019   CALCIUM 9.6 11/11/2019   ANIONGAP 13 09/13/2019   Lab Results  Component Value Date   CHOL 141 04/19/2019   Lab Results  Component Value Date   HDL 35 (L) 04/19/2019   Lab Results  Component Value Date   LDLCALC 71 04/19/2019   Lab Results  Component Value Date   TRIG 197 (H) 09/05/2019   Lab Results  Component Value Date   CHOLHDL 4.0 04/19/2019   Lab Results  Component Value Date   HGBA1C 6.5 (A) 11/11/2019      Assessment & Plan:   Problem List Items Addressed This Visit      Cardiovascular and Mediastinum   Essential hypertension - Primary Encouraged on going compliance with current medication regimen Trial of Catapres since his BP is uncontrolled. Wonder if compliance is an issue. Will further evaluate to use of ACE/ARB. Nephrology referral in the past. ? If seen . Patient does have insurance issues at this time.  Encouraged home monitoring and recording BP <130/80 Eating a heart-healthy diet with less salt Encouraged regular physical activity  Recommend Weight loss      Relevant Medications   amLODipine (NORVASC) 10 MG tablet   hydrALAZINE (APRESOLINE) 100 MG tablet   cloNIDine (CATAPRES - DOSED IN MG/24 HR) 0.1 mg/24hr patch   Other Relevant Orders   HgB A1c (Completed)   Urinalysis Dipstick (Completed)   Comp. Metabolic Panel (12) (Completed)     Endocrine   Uncontrolled type 2 diabetes mellitus with hyperglycemia (HCC) Patient has had a significant weight loss. He is exercising and trying to make changes. This is evident by his current A1C Encourage compliance with current treatment regimen   Encourage regular CBG monitoring Encourage contacting office if excessive hyperglycemia and or hypoglycemia Lifestyle modification with healthy diet (fewer calories,  more high fiber foods, whole grains and non-starchy vegetables, lower fat meat and fish, low-fat diary include healthy oils) regular exercise (physical activity) and weight loss Opthalmology exam discussed referral for speak with SW for [facility] application   Regular dental visits encouraged Home BP monitoring also encouraged goal <130/80     Relevant Medications   insulin glargine (LANTUS SOLOSTAR) 100 UNIT/ML Solostar Pen   Other Relevant Orders   HgB A1c (Completed)     Other   History of seizure Continue with current regimen   Relevant Medications   levETIRAcetam (KEPPRA) 1000 MG tablet    Other Visit Diagnoses    Localized edema  Encourage compliance with current treatment plan Work up to evaluate for DVT, hepatic and renal disease, venous insufficiency  Avoid sitting or staying in one position for long periods of time. Encourage movement Avoid eating too much salt Avoid NSAIDs Recommend elevation above heart level several times and day  Proper fitting compression socks or stocking Skin protection: keep skin clean, moisturized and free of injury  Relevant Orders   US Venous Img Lower Unilateral Left   Anemia, unspecified type       Relevant Orders   CBC with Differential/Platelet (Completed)      Meds ordered this encounter  Medications   amLODipine (NORVASC) 10 MG tablet    Sig: Take 1 tablet (10 mg total) by mouth daily.    Dispense:  30 tablet    Refill:  3    Order Specific Question:   Supervising Provider    Answer:   JEFFERY, MICHAEL J [[id number]]   hydrALAZINE (APRESOLINE) 100 MG tablet    Sig: Take 1 tablet (100 mg total) by mouth 3 (three) times daily.    Dispense:  90 tablet    Refill:  0    Dose change    Order Specific Question:   Supervising Provider    Answer:   JEFFERY, MICHAEL J [[id number]]   cloNIDine (CATAPRES - DOSED IN MG/24 HR) 0.1 mg/24hr patch    Sig: Place 1 patch (0.1 mg total) onto the skin once a week.    Dispense:  4 patch     Refill:  12    Order Specific Question:   Supervising Provider    Answer:   JEFFERY, MICHAEL J [[id number]]   insulin glargine (LANTUS SOLOSTAR) 100 UNIT/ML Solostar Pen    Sig: Inject 30 Units into the skin daily at 10 pm.    Dispense:  9 mL    Refill:  11    Order Specific Question:   Supervising Provider    Answer:   JEFFERY, MICHAEL J [[id number]]   levETIRAcetam (KEPPRA) 1000 MG tablet    Sig: Take 1 tablet (1,000 mg total) by mouth 2 (two) times daily.    Dispense:  60 tablet    Refill:  11    Order Specific Question:   Supervising Provider    Answer:   JEFFERY, MICHAEL J [[id number]]   DISCONTD: cloNIDine (CATAPRES) tablet 0.1 mg   cloNIDine (CATAPRES) tablet 0.1 mg   cloNIDine (CATAPRES) tablet 0.2 mg    Follow-up: Return in about 4 weeks (around 12/09/2019).    Barbette Merino, NP

## 2019-11-11 NOTE — Patient Instructions (Signed)
Illegal Drug Use Information, Adult Illegal drugs are chemicals and substances that are illegal to use, sell, or have (possess). Health care providers and pharmacies do not use or carry these types of drugs to treat medical problems because they can cause serious side effects and can lead to death. Examples of illegal drugs include:  Cocaine or crack.  Meth (methamphetamine) or Sostenes Kauffmann meth.  "Bath salts" (synthetic cathinones).  Heroin.  LSD, PCP, or acid.  Ecstasy. What is drug dependence? Using illegal drugs often leads to dependence or addiction. When you use certain drugs over a long period of time, your brain chemistry changes so that you can no longer function normally without that drug. This is called drug dependence. Drug dependence can cause you to:  Have unpleasant feelings and physical problems when you stop using the drug (withdrawal).  Be unable to perform at work or at home, or do the activities you used to do, without using the drug. Some drugs make people feel so good that they want to use the drug again and again. This is called drug addiction. People who are addicted spend a lot of time seeking out the drug so that they can get the feeling they want from it. Addiction and dependence can be very hard to overcome. How can illegal drug use and dependence affect me? Using an illegal drug only once can have a major impact on your life. It is possible to die from side effects after using a drug just once. If you use an illegal drug repeatedly, you may need to take larger and larger doses of the drug to experience the feelings you want. Drug dependency and addiction may lead to:  Being unable to care for yourself and others.  Withdrawal, if you stop using the drug.  Negative effects on your relationships and work performance. It causes others not to trust you. If you have children, you could lose custody of them.  Behaving in ways that do not match your values.  Lying and  crime, such as stealing.  Jail or prison. This can affect your ability to find a good job or continue your education.  Health problems such as tooth loss, skin problems, heart and lung disease, and stomach problems.  If you are a woman, you may have problems with pregnancy, including: ? Losing the pregnancy early (miscarriage), early delivery (premature birth), or delivering a lifeless infant (stillbirth). ? Slow or abnormal growth (birth defects) of your unborn child. ? Giving birth to a newborn who is addicted to illegal drugs.  A drug overdose. This is a dangerous situation that requires hospitalization and often leads to death. What are the benefits of avoiding illegal drug use? Avoiding illegal drug use can:  Keep your mind and body healthy. This can help improve work performance and participation in activities.  Keep you from developing drug dependency or addiction. This can help you avoid negative side effects such as withdrawal and overdose.  Help you have healthy relationships with your friends and family.  Help you have more stable finances. Instead of using your money for drugs, you can: ? Spend it on things you would like to have. ? Save it for future use, such as for retirement or for big purchases like a car or a house.  Help you avoid a permanent criminal record, jail time, or prison time. Having a clean record allows you to have more job and educational opportunities in the future. What actions can be taken? To avoid using  illegal drugs:  Find healthy ways to cope with stress, such as exercise, meditation, or spending time with family and friends. Talk with your health care provider about how you feel and how to cope with stress.  Spend time with people who do not use illegal drugs, or make new friends who do not use drugs.  Do something else instead of using drugs. You can exercise, take up a hobby, or participate in activities that you can do with others.  Do not  be afraid to say no if someone offers you an illegal drug. Speak up about why you do not want to use drugs. You can be a positive role model for others.  Work with a health care provider or counselor to create a program for yourself to help you deal with various aspects of your addiction. Where to find more information You can find more information about illegal drug use, dependence, and addiction from:  Your health care provider or mental health counselor.  Narcotics Anonymous: www.na.org  Substance Abuse and Mental Health Services Administration: ? Treatment finder: IdentityList.se ? National helpline: 1-800-662-HELP 817-636-6243) Contact a health care provider if:  You use illegal drugs.  You have missed family activities or work to use illegal drugs.  You have lied or stolen to get illegal drugs.  You lose interest in things you used to enjoy, like hobbies or family activities.  Your eating or sleeping habits change as a result of drug use.  You use medicine to get the same effects as a drug. This is illegal use and can become addictive as well.  You stopped illegal drug use previously and you start actively using it again (relapse).  You want help to change your addictive behavior. Get help right away if:  You have thoughts about hurting yourself or others. If you ever feel like you may hurt yourself or others, or have thoughts about taking your own life, get help right away. You can go to your nearest emergency department or call:  Your local emergency services (911 in the [place]  A suicide crisis helpline, such as the National Suicide Prevention Lifeline at 214 457 6811. This is open 24 hours a day. Summary  Illegal drugs are chemicals and substances that are illegal to use, sell, or possess.  Using illegal drugs often leads to dependence or addiction. This means that you need the drug to feel normal.  If you use illegal drugs, look for resources to help  you quit and find healthy ways to cope with stress. This information is not intended to replace advice given to you by your health care provider. Make sure you discuss any questions you have with your health care provider. Document Revised: 12/15/2018 Document Reviewed: 09/17/2016 Elsevier Patient Education  2020 [employer]. Managing Your Hypertension Hypertension is commonly called high blood pressure. This is when the force of your blood pressing against the walls of your arteries is too strong. Arteries are blood vessels that carry blood from your heart throughout your body. Hypertension forces the heart to work harder to pump blood, and may cause the arteries to become narrow or stiff. Having untreated or uncontrolled hypertension can cause heart attack, stroke, kidney disease, and other problems. What are blood pressure readings? A blood pressure reading consists of a higher number over a lower number. Ideally, your blood pressure should be below 120/80. The first ("top") number is called the systolic pressure. It is a measure of the pressure in your arteries as your heart beats.  The second ("bottom") number is called the diastolic pressure. It is a measure of the pressure in your arteries as the heart relaxes. What does my blood pressure reading mean? Blood pressure is classified into four stages. Based on your blood pressure reading, your health care provider may use the following stages to determine what type of treatment you need, if any. Systolic pressure and diastolic pressure are measured in a unit called mm Hg. Normal  Systolic pressure: below 120.  Diastolic pressure: below 80. Elevated  Systolic pressure: 120-129.  Diastolic pressure: below 80. Hypertension stage 1  Systolic pressure: 130-139.  Diastolic pressure: 80-89. Hypertension stage 2  Systolic pressure: 140 or above.  Diastolic pressure: 90 or above. What health risks are associated with hypertension? Managing  your hypertension is an important responsibility. Uncontrolled hypertension can lead to:  A heart attack.  A stroke.  A weakened blood vessel (aneurysm).  Heart failure.  Kidney damage.  Eye damage.  Metabolic syndrome.  Memory and concentration problems. What changes can I make to manage my hypertension? Hypertension can be managed by making lifestyle changes and possibly by taking medicines. Your health care provider will help you make a plan to bring your blood pressure within a normal range. Eating and drinking   Eat a diet that is high in fiber and potassium, and low in salt (sodium), added sugar, and fat. An example eating plan is called the DASH (Dietary Approaches to Stop Hypertension) diet. To eat this way: ? Eat plenty of fresh fruits and vegetables. Try to fill half of your plate at each meal with fruits and vegetables. ? Eat whole grains, such as whole wheat pasta, brown rice, or whole grain bread. Fill about one quarter of your plate with whole grains. ? Eat low-fat diary products. ? Avoid fatty cuts of meat, processed or cured meats, and poultry with skin. Fill about one quarter of your plate with lean proteins such as fish, chicken without skin, beans, eggs, and tofu. ? Avoid premade and processed foods. These tend to be higher in sodium, added sugar, and fat.  Reduce your daily sodium intake. Most people with hypertension should eat less than 1,500 mg of sodium a day.  Limit alcohol intake to no more than 1 drink a day for nonpregnant women and 2 drinks a day for men. One drink equals 12 oz of beer, 5 oz of wine, or 1 oz of hard liquor. Lifestyle  Work with your health care provider to maintain a healthy body weight, or to lose weight. Ask what an ideal weight is for you.  Get at least 30 minutes of exercise that causes your heart to beat faster (aerobic exercise) most days of the week. Activities may include walking, swimming, or biking.  Include exercise to  strengthen your muscles (resistance exercise), such as weight lifting, as part of your weekly exercise routine. Try to do these types of exercises for 30 minutes at least 3 days a week.  Do not use any products that contain nicotine or tobacco, such as cigarettes and e-cigarettes. If you need help quitting, ask your health care provider.  Control any long-term (chronic) conditions you have, such as high cholesterol or diabetes. Monitoring  Monitor your blood pressure at home as told by your health care provider. Your personal target blood pressure may vary depending on your medical conditions, your age, and other factors.  Have your blood pressure checked regularly, as often as told by your health care provider. Working with your  health care provider  Review all the medicines you take with your health care provider because there may be side effects or interactions.  Talk with your health care provider about your diet, exercise habits, and other lifestyle factors that may be contributing to hypertension.  Visit your health care provider regularly. Your health care provider can help you create and adjust your plan for managing hypertension. Will I need medicine to control my blood pressure? Your health care provider may prescribe medicine if lifestyle changes are not enough to get your blood pressure under control, and if:  Your systolic blood pressure is 130 or higher.  Your diastolic blood pressure is 80 or higher. Take medicines only as told by your health care provider. Follow the directions carefully. Blood pressure medicines must be taken as prescribed. The medicine does not work as well when you skip doses. Skipping doses also puts you at risk for problems. Contact a health care provider if:  You think you are having a reaction to medicines you have taken.  You have repeated (recurrent) headaches.  You feel dizzy.  You have swelling in your ankles.  You have trouble with your  vision. Get help right away if:  You develop a severe headache or confusion.  You have unusual weakness or numbness, or you feel faint.  You have severe pain in your chest or abdomen.  You vomit repeatedly.  You have trouble breathing. Summary  Hypertension is when the force of blood pumping through your arteries is too strong. If this condition is not controlled, it may put you at risk for serious complications.  Your personal target blood pressure may vary depending on your medical conditions, your age, and other factors. For most people, a normal blood pressure is less than 120/80.  Hypertension is managed by lifestyle changes, medicines, or both. Lifestyle changes include weight loss, eating a healthy, low-sodium diet, exercising more, and limiting alcohol. This information is not intended to replace advice given to you by your health care provider. Make sure you discuss any questions you have with your health care provider. Document Revised: 07/16/2018 Document Reviewed: 02/20/2016 Elsevier Patient Education  2020 [employer].

## 2019-11-12 LAB — CBC WITH DIFFERENTIAL/PLATELET
Basophils Absolute: 0.1 10*3/uL (ref 0.0–0.2)
Basos: 1 %
EOS (ABSOLUTE): 1.1 10*3/uL — ABNORMAL HIGH (ref 0.0–0.4)
Eos: 13 %
Hematocrit: 40 % (ref 37.5–51.0)
Hemoglobin: 12.3 g/dL — ABNORMAL LOW (ref 13.0–17.7)
Immature Grans (Abs): 0 10*3/uL (ref 0.0–0.1)
Immature Granulocytes: 0 %
Lymphocytes Absolute: 2.3 10*3/uL (ref 0.7–3.1)
Lymphs: 26 %
MCH: 23.3 pg — ABNORMAL LOW (ref 26.6–33.0)
MCHC: 30.8 g/dL — ABNORMAL LOW (ref 31.5–35.7)
MCV: 76 fL — ABNORMAL LOW (ref 79–97)
Monocytes Absolute: 0.6 10*3/uL (ref 0.1–0.9)
Monocytes: 7 %
Neutrophils Absolute: 4.8 10*3/uL (ref 1.4–7.0)
Neutrophils: 53 %
Platelets: 309 10*3/uL (ref 150–450)
RBC: 5.29 x10E6/uL (ref 4.14–5.80)
RDW: 16 % — ABNORMAL HIGH (ref 11.6–15.4)
WBC: 9 10*3/uL (ref 3.4–10.8)

## 2019-11-12 LAB — COMP. METABOLIC PANEL (12)
AST: 15 IU/L (ref 0–40)
Albumin/Globulin Ratio: 1.5 (ref 1.2–2.2)
Albumin: 4.1 g/dL (ref 4.0–5.0)
Alkaline Phosphatase: 71 IU/L (ref 48–121)
BUN/Creatinine Ratio: 12 (ref 9–20)
BUN: 20 mg/dL (ref 6–24)
Bilirubin Total: 0.5 mg/dL (ref 0.0–1.2)
Calcium: 9.6 mg/dL (ref 8.7–10.2)
Chloride: 100 mmol/L (ref 96–106)
Creatinine, Ser: 1.61 mg/dL — ABNORMAL HIGH (ref 0.76–1.27)
GFR calc Af Amer: 60 mL/min/{1.73_m2} (ref >59)
GFR calc non Af Amer: 52 mL/min/{1.73_m2} — ABNORMAL LOW (ref >59)
Globulin, Total: 2.7 g/dL (ref 1.5–4.5)
Glucose: 136 mg/dL — ABNORMAL HIGH (ref 65–99)
Potassium: 3.7 mmol/L (ref 3.5–5.2)
Sodium: 141 mmol/L (ref 134–144)
Total Protein: 6.8 g/dL (ref 6.0–8.5)

## 2019-11-16 ENCOUNTER — Encounter: Payer: Self-pay | Admitting: Nurse Practitioner

## 2019-11-16 ENCOUNTER — Other Ambulatory Visit: Payer: Self-pay | Admitting: Nurse Practitioner

## 2019-11-16 DIAGNOSIS — N1831 Chronic kidney disease, stage 3a: Secondary | ICD-10-CM

## 2019-12-09 ENCOUNTER — Ambulatory Visit: Payer: Self-pay | Admitting: Nurse Practitioner

## 2019-12-13 ENCOUNTER — Other Ambulatory Visit: Payer: Self-pay | Admitting: Nurse Practitioner

## 2019-12-13 MED FILL — AMLODIPINE BESYLATE 10 MG T: 10 | 30 days supply | Qty: 30 | Fill #1

## 2019-12-23 MED FILL — PANTOPRAZOLE SOD DR 40 MG T: 40 | 30 days supply | Qty: 30 | Fill #0

## 2019-12-24 ENCOUNTER — Emergency Department (HOSPITAL_COMMUNITY): Payer: Self-pay

## 2019-12-24 ENCOUNTER — Emergency Department (HOSPITAL_COMMUNITY)
Admission: EM | Admit: 2019-12-24 | Discharge: 2019-12-24 | Disposition: A | Discharge status: Home / Self Care | Payer: Self-pay | Attending: Emergency Medicine | Admitting: Emergency Medicine

## 2019-12-24 ENCOUNTER — Encounter (HOSPITAL_COMMUNITY): Payer: Self-pay | Admitting: Emergency Medicine

## 2019-12-24 ENCOUNTER — Other Ambulatory Visit: Payer: Self-pay

## 2019-12-24 DIAGNOSIS — I10 Essential (primary) hypertension: Secondary | ICD-10-CM

## 2019-12-24 DIAGNOSIS — Z7984 Long term (current) use of oral hypoglycemic drugs: Secondary | ICD-10-CM | POA: Insufficient documentation

## 2019-12-24 DIAGNOSIS — S8292XA Unspecified fracture of left lower leg, initial encounter for closed fracture: Secondary | ICD-10-CM | POA: Insufficient documentation

## 2019-12-24 DIAGNOSIS — Z79899 Other long term (current) drug therapy: Secondary | ICD-10-CM | POA: Insufficient documentation

## 2019-12-24 DIAGNOSIS — Z7982 Long term (current) use of aspirin: Secondary | ICD-10-CM | POA: Insufficient documentation

## 2019-12-24 DIAGNOSIS — S50811A Abrasion of right forearm, initial encounter: Secondary | ICD-10-CM | POA: Insufficient documentation

## 2019-12-24 DIAGNOSIS — S50812A Abrasion of left forearm, initial encounter: Secondary | ICD-10-CM | POA: Insufficient documentation

## 2019-12-24 DIAGNOSIS — F1590 Other stimulant use, unspecified, uncomplicated: Secondary | ICD-10-CM | POA: Insufficient documentation

## 2019-12-24 DIAGNOSIS — N189 Chronic kidney disease, unspecified: Secondary | ICD-10-CM | POA: Insufficient documentation

## 2019-12-24 DIAGNOSIS — I129 Hypertensive chronic kidney disease with stage 1 through stage 4 chronic kidney disease, or unspecified chronic kidney disease: Secondary | ICD-10-CM | POA: Insufficient documentation

## 2019-12-24 DIAGNOSIS — Z87891 Personal history of nicotine dependence: Secondary | ICD-10-CM | POA: Insufficient documentation

## 2019-12-24 DIAGNOSIS — E119 Type 2 diabetes mellitus without complications: Secondary | ICD-10-CM | POA: Insufficient documentation

## 2019-12-24 DIAGNOSIS — W010XXA Fall on same level from slipping, tripping and stumbling without subsequent striking against object, initial encounter: Secondary | ICD-10-CM | POA: Insufficient documentation

## 2019-12-24 DIAGNOSIS — S82892A Other fracture of left lower leg, initial encounter for closed fracture: Secondary | ICD-10-CM

## 2019-12-24 MED ORDER — CLONIDINE HCL 0.1 MG/24HR TD PTWK
0.1000 mg | MEDICATED_PATCH | Freq: Once | TRANSDERMAL | Status: DC
  Administered 2019-12-24: 0.1 mg via TRANSDERMAL
  Filled 2019-12-24: qty 1
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
## 2019-12-24 NOTE — Discharge Instructions (Addendum)
You can wear the clonidine patch we gave you here till you get your new prescription filled on Monday.  Continue to take your other blood pressure medicine.  Elevate the foot as often as you can to help with swelling and use the crutches and do not put weight on the foot until the specialist says you can.

## 2019-12-26 MED FILL — levETIRAcetam 1000 MG TABS: 1000 | 30 days supply | Qty: 60 | Fill #0

## 2019-12-26 MED FILL — METOPROLOL TARTRATE 50 MG T: 50 | 30 days supply | Qty: 60 | Fill #3

## 2019-12-26 MED FILL — metFORMIN HCL 1000 MG TABS: 1000 | 30 days supply | Qty: 60 | Fill #3

## 2019-12-26 MED FILL — AMLODIPINE BESYLATE 10 MG T: 10 | 30 days supply | Qty: 30 | Fill #1

## 2019-12-26 MED FILL — hydrALAZINE HCL 100 MG TABS: 100 | 30 days supply | Qty: 90 | Fill #2

## 2019-12-30 MED FILL — CLONIDINE 0.1 MG/24HR PTWK: 0.1 | 28 days supply | Qty: 4 | Fill #1

## 2020-01-18 ENCOUNTER — Encounter: Payer: Self-pay | Admitting: Orthopaedic Surgery

## 2020-01-18 ENCOUNTER — Other Ambulatory Visit: Payer: Self-pay

## 2020-01-18 ENCOUNTER — Ambulatory Visit (INDEPENDENT_AMBULATORY_CARE_PROVIDER_SITE_OTHER): Payer: Self-pay | Admitting: Orthopaedic Surgery

## 2020-01-18 ENCOUNTER — Ambulatory Visit (INDEPENDENT_AMBULATORY_CARE_PROVIDER_SITE_OTHER): Payer: Self-pay

## 2020-01-18 VITALS — Ht 69.0 in

## 2020-01-18 DIAGNOSIS — M25562 Pain in left knee: Secondary | ICD-10-CM

## 2020-01-18 DIAGNOSIS — M25572 Pain in left ankle and joints of left foot: Secondary | ICD-10-CM

## 2020-01-18 DIAGNOSIS — S82842A Displaced bimalleolar fracture of left lower leg, initial encounter for closed fracture: Secondary | ICD-10-CM
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
## 2020-01-30 ENCOUNTER — Other Ambulatory Visit: Payer: Self-pay | Admitting: Family Medicine

## 2020-01-30 MED FILL — AMLODIPINE BESYLATE 10 MG T: 10 | 30 days supply | Qty: 30 | Fill #2

## 2020-02-27 MED FILL — hydrALAZINE HCL 100 MG TABS: 100 | 30 days supply | Qty: 90 | Fill #3

## 2020-02-27 MED FILL — METFORMIN HCL 1000 MG TABS: 1000 | 30 days supply | Qty: 60 | Fill #4

## 2020-02-27 MED FILL — levETIRAcetam 1000 MG TABS: 1000 | 30 days supply | Qty: 60 | Fill #1

## 2020-02-27 MED FILL — AMLODIPINE BESYLATE 10 MG T: 10 | 30 days supply | Qty: 30 | Fill #2

## 2020-05-08 ENCOUNTER — Other Ambulatory Visit: Payer: Self-pay | Admitting: Family Medicine

## 2020-05-08 DIAGNOSIS — E1165 Type 2 diabetes mellitus with hyperglycemia: Secondary | ICD-10-CM

## 2020-05-08 MED FILL — hydrALAZINE HCL 100 MG TABS: 100 | 30 days supply | Qty: 90 | Fill #0

## 2020-05-08 MED FILL — levETIRAcetam 1000 MG TABS: 1000 | 30 days supply | Qty: 60 | Fill #2

## 2020-05-08 MED FILL — AMLODIPINE BESYLATE 10 MG T: 10 | 30 days supply | Qty: 30 | Fill #3

## 2020-05-08 MED FILL — METFORMIN HCL 1000 MG TABS: 1000 | 30 days supply | Qty: 60 | Fill #0

## 2020-05-08 NOTE — Telephone Encounter (Signed)
Please see refill request.

## 2020-05-09 NOTE — Telephone Encounter (Signed)
Please call an make this pt an apt for fu [phone] thanks He was suppose to return in 4 weeks that was in August. Thanks

## 2020-07-24 ENCOUNTER — Other Ambulatory Visit: Payer: Self-pay

## 2020-07-24 ENCOUNTER — Other Ambulatory Visit: Payer: Self-pay | Admitting: Nurse Practitioner

## 2020-07-24 ENCOUNTER — Other Ambulatory Visit: Payer: Self-pay | Admitting: Family Medicine

## 2020-07-24 DIAGNOSIS — E1165 Type 2 diabetes mellitus with hyperglycemia: Secondary | ICD-10-CM

## 2020-07-24 DIAGNOSIS — I10 Essential (primary) hypertension: Secondary | ICD-10-CM

## 2020-07-24 MED FILL — Levetiracetam Tab 1000 MG: ORAL | 30 days supply | Qty: 60 | Fill #0 | Status: AC

## 2020-07-25 ENCOUNTER — Other Ambulatory Visit: Payer: Self-pay

## 2020-07-25 MED ORDER — AMLODIPINE BESYLATE 10 MG PO TABS
ORAL_TABLET | Freq: Every day | ORAL | 3 refills | Status: DC
  Filled 2020-07-25: qty 30, 30d supply, fill #0

## 2020-07-25 MED ORDER — HYDRALAZINE HCL 100 MG PO TABS
ORAL_TABLET | Freq: Three times a day (TID) | ORAL | 0 refills | Status: DC
  Filled 2020-07-25: qty 90, 30d supply, fill #0

## 2020-07-25 MED ORDER — METFORMIN HCL 1000 MG PO TABS
ORAL_TABLET | Freq: Two times a day (BID) | ORAL | 0 refills | Status: DC
  Filled 2020-07-25: qty 60, 30d supply, fill #0

## 2020-07-26 ENCOUNTER — Other Ambulatory Visit: Payer: Self-pay

## 2021-01-12 ENCOUNTER — Emergency Department (HOSPITAL_COMMUNITY): Payer: Medicaid Other

## 2021-01-12 ENCOUNTER — Encounter (HOSPITAL_COMMUNITY): Payer: Self-pay | Admitting: Emergency Medicine

## 2021-01-12 ENCOUNTER — Inpatient Hospital Stay (HOSPITAL_COMMUNITY)
Admission: EM | Admit: 2021-01-12 | Discharge: 2021-01-19 | DRG: 304 | Disposition: A | Discharge status: Home / Self Care | Payer: Medicaid Other | Attending: Internal Medicine | Admitting: Internal Medicine

## 2021-01-12 ENCOUNTER — Inpatient Hospital Stay (HOSPITAL_COMMUNITY): Payer: Medicaid Other

## 2021-01-12 ENCOUNTER — Other Ambulatory Visit: Payer: Self-pay

## 2021-01-12 DIAGNOSIS — Z794 Long term (current) use of insulin: Secondary | ICD-10-CM

## 2021-01-12 DIAGNOSIS — Z20822 Contact with and (suspected) exposure to covid-19: Secondary | ICD-10-CM | POA: Diagnosis present

## 2021-01-12 DIAGNOSIS — J9601 Acute respiratory failure with hypoxia: Secondary | ICD-10-CM

## 2021-01-12 DIAGNOSIS — I6340 Cerebral infarction due to embolism of unspecified cerebral artery: Secondary | ICD-10-CM | POA: Diagnosis not present

## 2021-01-12 DIAGNOSIS — E1122 Type 2 diabetes mellitus with diabetic chronic kidney disease: Secondary | ICD-10-CM | POA: Diagnosis present

## 2021-01-12 DIAGNOSIS — F1911 Other psychoactive substance abuse, in remission: Secondary | ICD-10-CM | POA: Diagnosis not present

## 2021-01-12 DIAGNOSIS — Z8673 Personal history of transient ischemic attack (TIA), and cerebral infarction without residual deficits: Secondary | ICD-10-CM

## 2021-01-12 DIAGNOSIS — Z7984 Long term (current) use of oral hypoglycemic drugs: Secondary | ICD-10-CM

## 2021-01-12 DIAGNOSIS — I10 Essential (primary) hypertension: Secondary | ICD-10-CM

## 2021-01-12 DIAGNOSIS — I248 Other forms of acute ischemic heart disease: Secondary | ICD-10-CM | POA: Diagnosis present

## 2021-01-12 DIAGNOSIS — R778 Other specified abnormalities of plasma proteins: Secondary | ICD-10-CM

## 2021-01-12 DIAGNOSIS — Z6835 Body mass index (BMI) 35.0-35.9, adult: Secondary | ICD-10-CM | POA: Diagnosis not present

## 2021-01-12 DIAGNOSIS — D638 Anemia in other chronic diseases classified elsewhere: Secondary | ICD-10-CM | POA: Diagnosis present

## 2021-01-12 DIAGNOSIS — N179 Acute kidney failure, unspecified: Secondary | ICD-10-CM | POA: Diagnosis present

## 2021-01-12 DIAGNOSIS — Z79899 Other long term (current) drug therapy: Secondary | ICD-10-CM | POA: Diagnosis not present

## 2021-01-12 DIAGNOSIS — E785 Hyperlipidemia, unspecified: Secondary | ICD-10-CM | POA: Diagnosis present

## 2021-01-12 DIAGNOSIS — Z8249 Family history of ischemic heart disease and other diseases of the circulatory system: Secondary | ICD-10-CM

## 2021-01-12 DIAGNOSIS — G40909 Epilepsy, unspecified, not intractable, without status epilepticus: Secondary | ICD-10-CM | POA: Diagnosis present

## 2021-01-12 DIAGNOSIS — I5031 Acute diastolic (congestive) heart failure: Secondary | ICD-10-CM | POA: Diagnosis not present

## 2021-01-12 DIAGNOSIS — E1169 Type 2 diabetes mellitus with other specified complication: Secondary | ICD-10-CM

## 2021-01-12 DIAGNOSIS — E1165 Type 2 diabetes mellitus with hyperglycemia: Secondary | ICD-10-CM

## 2021-01-12 DIAGNOSIS — I160 Hypertensive urgency: Secondary | ICD-10-CM

## 2021-01-12 DIAGNOSIS — Z9114 Patient's other noncompliance with medication regimen: Secondary | ICD-10-CM

## 2021-01-12 DIAGNOSIS — R569 Unspecified convulsions: Secondary | ICD-10-CM | POA: Diagnosis not present

## 2021-01-12 DIAGNOSIS — E876 Hypokalemia: Secondary | ICD-10-CM | POA: Diagnosis present

## 2021-01-12 DIAGNOSIS — E669 Obesity, unspecified: Secondary | ICD-10-CM | POA: Diagnosis present

## 2021-01-12 DIAGNOSIS — Z87891 Personal history of nicotine dependence: Secondary | ICD-10-CM

## 2021-01-12 DIAGNOSIS — J189 Pneumonia, unspecified organism: Secondary | ICD-10-CM | POA: Diagnosis present

## 2021-01-12 DIAGNOSIS — N1831 Chronic kidney disease, stage 3a: Secondary | ICD-10-CM | POA: Diagnosis present

## 2021-01-12 DIAGNOSIS — I161 Hypertensive emergency: Principal | ICD-10-CM | POA: Diagnosis present

## 2021-01-12 DIAGNOSIS — R0600 Dyspnea, unspecified: Secondary | ICD-10-CM

## 2021-01-12 DIAGNOSIS — E559 Vitamin D deficiency, unspecified: Secondary | ICD-10-CM | POA: Diagnosis present

## 2021-01-12 DIAGNOSIS — Z833 Family history of diabetes mellitus: Secondary | ICD-10-CM

## 2021-01-12 DIAGNOSIS — I130 Hypertensive heart and chronic kidney disease with heart failure and stage 1 through stage 4 chronic kidney disease, or unspecified chronic kidney disease: Secondary | ICD-10-CM | POA: Diagnosis present

## 2021-01-12 DIAGNOSIS — Z7982 Long term (current) use of aspirin: Secondary | ICD-10-CM

## 2021-01-12 DIAGNOSIS — I5033 Acute on chronic diastolic (congestive) heart failure: Secondary | ICD-10-CM | POA: Diagnosis present

## 2021-01-12 DIAGNOSIS — Z841 Family history of disorders of kidney and ureter: Secondary | ICD-10-CM

## 2021-01-12 DIAGNOSIS — R7989 Other specified abnormal findings of blood chemistry: Secondary | ICD-10-CM | POA: Diagnosis present

## 2021-01-12 LAB — CBC WITH DIFFERENTIAL/PLATELET
Abs Immature Granulocytes: 0.04 10*3/uL (ref 0.00–0.07)
Basophils Absolute: 0 10*3/uL (ref 0.0–0.1)
Basophils Relative: 1 %
Eosinophils Absolute: 0 10*3/uL (ref 0.0–0.5)
Eosinophils Relative: 0 %
HCT: 46.3 % (ref 39.0–52.0)
Hemoglobin: 14.4 g/dL (ref 13.0–17.0)
Immature Granulocytes: 1 %
Lymphocytes Relative: 9 %
Lymphs Abs: 0.8 10*3/uL (ref 0.7–4.0)
MCH: 25.1 pg — ABNORMAL LOW (ref 26.0–34.0)
MCHC: 31.1 g/dL (ref 30.0–36.0)
MCV: 80.8 fL (ref 80.0–100.0)
Monocytes Absolute: 0.5 10*3/uL (ref 0.1–1.0)
Monocytes Relative: 6 %
Neutro Abs: 7 10*3/uL (ref 1.7–7.7)
Neutrophils Relative %: 83 %
Platelets: 288 10*3/uL (ref 150–400)
RBC: 5.73 MIL/uL (ref 4.22–5.81)
RDW: 16.6 % — ABNORMAL HIGH (ref 11.5–15.5)
WBC: 8.4 10*3/uL (ref 4.0–10.5)
nRBC: 0 % (ref 0.0–0.2)

## 2021-01-12 LAB — COMPREHENSIVE METABOLIC PANEL
ALT: 23 U/L (ref 0–44)
AST: 43 U/L — ABNORMAL HIGH (ref 15–41)
Albumin: 3.3 g/dL — ABNORMAL LOW (ref 3.5–5.0)
Alkaline Phosphatase: 52 U/L (ref 38–126)
Anion gap: 13 (ref 5–15)
BUN: 25 mg/dL — ABNORMAL HIGH (ref 6–20)
CO2: 25 mmol/L (ref 22–32)
Calcium: 8.8 mg/dL — ABNORMAL LOW (ref 8.9–10.3)
Chloride: 96 mmol/L — ABNORMAL LOW (ref 98–111)
Creatinine, Ser: 2.29 mg/dL — ABNORMAL HIGH (ref 0.61–1.24)
GFR, Estimated: 36 mL/min — ABNORMAL LOW (ref >60)
Glucose, Bld: 169 mg/dL — ABNORMAL HIGH (ref 70–99)
Potassium: 2.8 mmol/L — ABNORMAL LOW (ref 3.5–5.1)
Sodium: 134 mmol/L — ABNORMAL LOW (ref 135–145)
Total Bilirubin: 1.3 mg/dL — ABNORMAL HIGH (ref 0.3–1.2)
Total Protein: 7.4 g/dL (ref 6.5–8.1)

## 2021-01-12 LAB — C-REACTIVE PROTEIN: CRP: 7.4 mg/dL — ABNORMAL HIGH (ref <1.0)

## 2021-01-12 LAB — RAPID URINE DRUG SCREEN, HOSP PERFORMED
Amphetamines: NOT DETECTED
Barbiturates: NOT DETECTED
Benzodiazepines: NOT DETECTED
Cocaine: NOT DETECTED
Opiates: NOT DETECTED
Tetrahydrocannabinol: NOT DETECTED

## 2021-01-12 LAB — RESP PANEL BY RT-PCR (FLU A&B, COVID) ARPGX2
Influenza A by PCR: NEGATIVE
Influenza B by PCR: NEGATIVE
SARS Coronavirus 2 by RT PCR: NEGATIVE

## 2021-01-12 LAB — BASIC METABOLIC PANEL
Anion gap: 11 (ref 5–15)
BUN: 28 mg/dL — ABNORMAL HIGH (ref 6–20)
CO2: 25 mmol/L (ref 22–32)
Calcium: 7.8 mg/dL — ABNORMAL LOW (ref 8.9–10.3)
Chloride: 96 mmol/L — ABNORMAL LOW (ref 98–111)
Creatinine, Ser: 2.41 mg/dL — ABNORMAL HIGH (ref 0.61–1.24)
GFR, Estimated: 34 mL/min — ABNORMAL LOW (ref >60)
Glucose, Bld: 161 mg/dL — ABNORMAL HIGH (ref 70–99)
Potassium: 3.2 mmol/L — ABNORMAL LOW (ref 3.5–5.1)
Sodium: 132 mmol/L — ABNORMAL LOW (ref 135–145)

## 2021-01-12 LAB — MRSA NEXT GEN BY PCR, NASAL: MRSA by PCR Next Gen: NOT DETECTED

## 2021-01-12 LAB — ECHOCARDIOGRAM COMPLETE
Area-P 1/2: 5.75 cm2
Calc EF: 58.2 %
Height: 69 in
S' Lateral: 4.6 cm
Single Plane A2C EF: 55.9 %
Single Plane A4C EF: 59.4 %
Weight: 3680 oz

## 2021-01-12 LAB — BLOOD GAS, ARTERIAL
Acid-Base Excess: 2 mmol/L (ref 0.0–2.0)
Bicarbonate: 25.1 mmol/L (ref 20.0–28.0)
O2 Saturation: 88.4 %
Patient temperature: 98.6
pCO2 arterial: 35.3 mmHg (ref 32.0–48.0)
pH, Arterial: 7.466 — ABNORMAL HIGH (ref 7.350–7.450)
pO2, Arterial: 57.7 mmHg — ABNORMAL LOW (ref 83.0–108.0)

## 2021-01-12 LAB — GLUCOSE, CAPILLARY
Glucose-Capillary: 158 mg/dL — ABNORMAL HIGH (ref 70–99)
Glucose-Capillary: 140 mg/dL — ABNORMAL HIGH (ref 70–99)
Glucose-Capillary: 158 mg/dL — ABNORMAL HIGH (ref 70–99)
Glucose-Capillary: 138 mg/dL — ABNORMAL HIGH (ref 70–99)

## 2021-01-12 LAB — CBG MONITORING, ED
Glucose-Capillary: 163 mg/dL — ABNORMAL HIGH (ref 70–99)
Glucose-Capillary: 142 mg/dL — ABNORMAL HIGH (ref 70–99)

## 2021-01-12 LAB — HEMOGLOBIN A1C
Hgb A1c MFr Bld: 7.1 % — ABNORMAL HIGH (ref 4.8–5.6)
Mean Plasma Glucose: 157.07 mg/dL

## 2021-01-12 LAB — MAGNESIUM: Magnesium: 1.6 mg/dL — ABNORMAL LOW (ref 1.7–2.4)

## 2021-01-12 LAB — TROPONIN I (HIGH SENSITIVITY)
Troponin I (High Sensitivity): 135 ng/L (ref <18)
Troponin I (High Sensitivity): 176 ng/L (ref <18)
Troponin I (High Sensitivity): 215 ng/L (ref <18)

## 2021-01-12 LAB — STREP PNEUMONIAE URINARY ANTIGEN: Strep Pneumo Urinary Antigen: NEGATIVE

## 2021-01-12 LAB — PROCALCITONIN: Procalcitonin: 0.23 ng/mL

## 2021-01-12 LAB — BRAIN NATRIURETIC PEPTIDE: B Natriuretic Peptide: 853.5 pg/mL — ABNORMAL HIGH (ref 0.0–100.0)

## 2021-01-12 LAB — HIV ANTIBODY (ROUTINE TESTING W REFLEX): HIV Screen 4th Generation wRfx: NONREACTIVE

## 2021-01-12 MED ORDER — LEVETIRACETAM IN NACL 1000 MG/100ML IV SOLN
1000.0000 mg | Freq: Two times a day (BID) | INTRAVENOUS | Status: DC
  Administered 2021-01-12: 1000 mg via INTRAVENOUS
  Filled 2021-01-12 (×3): qty 100

## 2021-01-12 MED ORDER — FUROSEMIDE 10 MG/ML IJ SOLN
40.0000 mg | Freq: Once | INTRAMUSCULAR | Status: AC
  Administered 2021-01-12: 40 mg via INTRAVENOUS
  Filled 2021-01-12: qty 4

## 2021-01-12 MED ORDER — INSULIN ASPART 100 UNIT/ML IJ SOLN
0.0000 [IU] | Freq: Three times a day (TID) | INTRAMUSCULAR | Status: DC
  Administered 2021-01-12 – 2021-01-13 (×3): 2 [IU] via SUBCUTANEOUS
  Administered 2021-01-14: 1 [IU] via SUBCUTANEOUS
  Administered 2021-01-14: 2 [IU] via SUBCUTANEOUS
  Administered 2021-01-14 – 2021-01-16 (×4): 1 [IU] via SUBCUTANEOUS
  Administered 2021-01-17: 2 [IU] via SUBCUTANEOUS
  Administered 2021-01-17 – 2021-01-19 (×3): 1 [IU] via SUBCUTANEOUS
  Filled 2021-01-12: qty 0.09

## 2021-01-12 MED ORDER — SODIUM CHLORIDE 0.9 % IV SOLN
500.0000 mg | Freq: Once | INTRAVENOUS | Status: AC
  Administered 2021-01-12: 500 mg via INTRAVENOUS
  Filled 2021-01-12: qty 500

## 2021-01-12 MED ORDER — INSULIN ASPART 100 UNIT/ML IJ SOLN
0.0000 [IU] | Freq: Every day | INTRAMUSCULAR | Status: DC
  Filled 2021-01-12: qty 0.05

## 2021-01-12 MED ORDER — PERFLUTREN LIPID MICROSPHERE
1.0000 mL | INTRAVENOUS | Status: DC | PRN
  Administered 2021-01-12: 3 mL via INTRAVENOUS
  Filled 2021-01-12: qty 10

## 2021-01-12 MED ORDER — SODIUM CHLORIDE 0.9 % IV SOLN
1.0000 g | Freq: Once | INTRAVENOUS | Status: AC
  Administered 2021-01-12: 1 g via INTRAVENOUS
  Filled 2021-01-12: qty 10

## 2021-01-12 MED ORDER — LORAZEPAM 1 MG PO TABS
1.0000 mg | ORAL_TABLET | Freq: Four times a day (QID) | ORAL | Status: DC | PRN
  Administered 2021-01-12: 1 mg via ORAL
  Filled 2021-01-12: qty 1

## 2021-01-12 MED ORDER — SODIUM CHLORIDE 0.9 % IV SOLN
2.0000 g | INTRAVENOUS | Status: AC
  Administered 2021-01-13 – 2021-01-16 (×4): 2 g via INTRAVENOUS
  Filled 2021-01-12 (×5): qty 20

## 2021-01-12 MED ORDER — AMLODIPINE BESYLATE 10 MG PO TABS
10.0000 mg | ORAL_TABLET | Freq: Every day | ORAL | Status: DC
  Administered 2021-01-12 – 2021-01-19 (×8): 10 mg via ORAL
  Filled 2021-01-12 (×8): qty 1

## 2021-01-12 MED ORDER — CLONIDINE HCL 0.1 MG/24HR TD PTWK
0.1000 mg | MEDICATED_PATCH | TRANSDERMAL | Status: DC
  Administered 2021-01-12: 0.1 mg via TRANSDERMAL
  Filled 2021-01-12: qty 1

## 2021-01-12 MED ORDER — FUROSEMIDE 10 MG/ML IJ SOLN: 40.0000 mg | Freq: Once | INTRAMUSCULAR | Status: DC

## 2021-01-12 MED ORDER — NITROGLYCERIN IN D5W 200-5 MCG/ML-% IV SOLN
0.0000 ug/min | INTRAVENOUS | Status: DC
  Administered 2021-01-12: 195 ug/min via INTRAVENOUS
  Administered 2021-01-12: 200 ug/min via INTRAVENOUS
  Administered 2021-01-12: 160 ug/min via INTRAVENOUS
  Administered 2021-01-12: 5 ug/min via INTRAVENOUS
  Administered 2021-01-13: 190 ug/min via INTRAVENOUS
  Administered 2021-01-13: 170 ug/min via INTRAVENOUS
  Administered 2021-01-13: 45 ug/min via INTRAVENOUS
  Administered 2021-01-14: 110 ug/min via INTRAVENOUS
  Filled 2021-01-12 (×6): qty 250

## 2021-01-12 MED ORDER — GUAIFENESIN-DM 100-10 MG/5ML PO SYRP
5.0000 mL | ORAL_SOLUTION | ORAL | Status: DC | PRN
  Administered 2021-01-12 – 2021-01-18 (×15): 5 mL via ORAL
  Filled 2021-01-12 (×15): qty 10

## 2021-01-12 MED ORDER — CLONIDINE HCL 0.1 MG/24HR TD PTWK: 0.1000 mg | MEDICATED_PATCH | TRANSDERMAL | Status: DC

## 2021-01-12 MED ORDER — ASPIRIN EC 81 MG PO TBEC
81.0000 mg | DELAYED_RELEASE_TABLET | Freq: Every day | ORAL | Status: DC
  Administered 2021-01-12 – 2021-01-19 (×8): 81 mg via ORAL
  Filled 2021-01-12 (×8): qty 1

## 2021-01-12 MED ORDER — FUROSEMIDE 10 MG/ML IJ SOLN
60.0000 mg | Freq: Once | INTRAMUSCULAR | Status: AC
  Administered 2021-01-12: 60 mg via INTRAVENOUS
  Filled 2021-01-12: qty 6

## 2021-01-12 MED ORDER — POTASSIUM CHLORIDE 10 MEQ/100ML IV SOLN
10.0000 meq | INTRAVENOUS | Status: AC
  Administered 2021-01-12 (×4): 10 meq via INTRAVENOUS
  Filled 2021-01-12 (×4): qty 100

## 2021-01-12 MED ORDER — POTASSIUM CHLORIDE CRYS ER 20 MEQ PO TBCR: 40.0000 meq | EXTENDED_RELEASE_TABLET | Freq: Every day | ORAL | Status: DC

## 2021-01-12 MED ORDER — ACETAMINOPHEN 325 MG PO TABS
650.0000 mg | ORAL_TABLET | ORAL | Status: DC | PRN
  Administered 2021-01-12 – 2021-01-13 (×2): 650 mg via ORAL
  Filled 2021-01-12 (×2): qty 2

## 2021-01-12 MED ORDER — NITROGLYCERIN 2 % TD OINT
1.0000 [in_us] | TOPICAL_OINTMENT | Freq: Once | TRANSDERMAL | Status: AC
  Administered 2021-01-12: 1 [in_us] via TOPICAL
  Filled 2021-01-12: qty 1

## 2021-01-12 MED ORDER — POTASSIUM CHLORIDE CRYS ER 20 MEQ PO TBCR: 40.0000 meq | EXTENDED_RELEASE_TABLET | Freq: Two times a day (BID) | ORAL | Status: DC

## 2021-01-12 MED ORDER — SODIUM CHLORIDE 0.9 % IV SOLN: 2.0000 g | INTRAVENOUS | Status: DC

## 2021-01-12 MED ORDER — ATORVASTATIN CALCIUM 10 MG PO TABS: 10.0000 mg | ORAL_TABLET | Freq: Every day | ORAL | Status: DC

## 2021-01-12 MED ORDER — ALBUTEROL SULFATE HFA 108 (90 BASE) MCG/ACT IN AERS: 2.0000 | INHALATION_SPRAY | RESPIRATORY_TRACT | Status: DC | PRN

## 2021-01-12 MED ORDER — METOPROLOL TARTRATE 25 MG PO TABS: 50.0000 mg | ORAL_TABLET | Freq: Two times a day (BID) | ORAL | Status: DC

## 2021-01-12 MED ORDER — LABETALOL HCL 5 MG/ML IV SOLN: 20.0000 mg | Freq: Once | INTRAVENOUS | Status: DC

## 2021-01-12 MED ORDER — CARVEDILOL 3.125 MG PO TABS
3.1250 mg | ORAL_TABLET | Freq: Two times a day (BID) | ORAL | Status: DC
  Administered 2021-01-12 – 2021-01-14 (×5): 3.125 mg via ORAL
  Filled 2021-01-12 (×5): qty 1

## 2021-01-12 MED ORDER — FUROSEMIDE 10 MG/ML IJ SOLN
40.0000 mg | Freq: Two times a day (BID) | INTRAMUSCULAR | Status: DC
  Administered 2021-01-13 – 2021-01-15 (×5): 40 mg via INTRAVENOUS
  Filled 2021-01-12 (×6): qty 4

## 2021-01-12 MED ORDER — ORAL CARE MOUTH RINSE
15.0000 mL | Freq: Two times a day (BID) | OROMUCOSAL | Status: DC
  Administered 2021-01-13 – 2021-01-14 (×3): 15 mL via OROMUCOSAL

## 2021-01-12 MED ORDER — HYDRALAZINE HCL 20 MG/ML IJ SOLN
10.0000 mg | INTRAMUSCULAR | Status: DC | PRN
  Administered 2021-01-12 – 2021-01-19 (×6): 10 mg via INTRAVENOUS
  Filled 2021-01-12 (×6): qty 1

## 2021-01-12 MED ORDER — LABETALOL HCL 5 MG/ML IV SOLN
10.0000 mg | INTRAVENOUS | Status: DC | PRN
  Administered 2021-01-12 – 2021-01-19 (×6): 10 mg via INTRAVENOUS
  Filled 2021-01-12 (×6): qty 4

## 2021-01-12 MED ORDER — HYDRALAZINE HCL 50 MG PO TABS
100.0000 mg | ORAL_TABLET | Freq: Three times a day (TID) | ORAL | Status: DC
  Administered 2021-01-12 – 2021-01-19 (×20): 100 mg via ORAL
  Filled 2021-01-12 (×21): qty 2

## 2021-01-12 MED ORDER — ONDANSETRON HCL 4 MG/2ML IJ SOLN
4.0000 mg | Freq: Four times a day (QID) | INTRAMUSCULAR | Status: DC | PRN
  Administered 2021-01-14: 4 mg via INTRAVENOUS
  Filled 2021-01-12: qty 2

## 2021-01-12 MED ORDER — ALBUTEROL SULFATE (2.5 MG/3ML) 0.083% IN NEBU
2.5000 mg | INHALATION_SOLUTION | RESPIRATORY_TRACT | Status: DC | PRN
  Administered 2021-01-12: 2.5 mg via RESPIRATORY_TRACT
  Filled 2021-01-12 (×2): qty 3

## 2021-01-12 MED ORDER — SODIUM CHLORIDE 0.9 % IV SOLN
250.0000 mL | INTRAVENOUS | Status: DC | PRN
  Administered 2021-01-12: 250 mL via INTRAVENOUS

## 2021-01-12 MED ORDER — CHLORHEXIDINE GLUCONATE CLOTH 2 % EX PADS
6.0000 | MEDICATED_PAD | Freq: Every day | CUTANEOUS | Status: DC
  Administered 2021-01-12 – 2021-01-17 (×6): 6 via TOPICAL

## 2021-01-12 MED ORDER — POTASSIUM CHLORIDE CRYS ER 20 MEQ PO TBCR
40.0000 meq | EXTENDED_RELEASE_TABLET | ORAL | Status: AC
  Administered 2021-01-12 (×2): 40 meq via ORAL
  Filled 2021-01-12 (×2): qty 2

## 2021-01-12 MED ORDER — MAGNESIUM SULFATE IN D5W 1-5 GM/100ML-% IV SOLN
1.0000 g | Freq: Once | INTRAVENOUS | Status: AC
  Administered 2021-01-12: 1 g via INTRAVENOUS
  Filled 2021-01-12: qty 100

## 2021-01-12 MED ORDER — LEVETIRACETAM 500 MG PO TABS
1000.0000 mg | ORAL_TABLET | Freq: Two times a day (BID) | ORAL | Status: DC
  Administered 2021-01-12: 1000 mg via ORAL

## 2021-01-12 MED ORDER — POTASSIUM CHLORIDE 10 MEQ/100ML IV SOLN
10.0000 meq | INTRAVENOUS | Status: AC
  Administered 2021-01-12 (×2): 10 meq via INTRAVENOUS
  Filled 2021-01-12 (×2): qty 100

## 2021-01-12 MED ORDER — SODIUM CHLORIDE 0.9 % IV SOLN
500.0000 mg | INTRAVENOUS | Status: DC
  Administered 2021-01-13 – 2021-01-14 (×2): 500 mg via INTRAVENOUS
  Filled 2021-01-12 (×2): qty 500

## 2021-01-12 MED ORDER — POTASSIUM CHLORIDE CRYS ER 20 MEQ PO TBCR: 40.0000 meq | EXTENDED_RELEASE_TABLET | Freq: Once | ORAL | Status: DC

## 2021-01-12 MED ORDER — HYDRALAZINE HCL 100 MG PO TABS: 100.0000 mg | ORAL_TABLET | Freq: Three times a day (TID) | ORAL | Status: DC

## 2021-01-12 MED ORDER — HEPARIN SODIUM (PORCINE) 5000 UNIT/ML IJ SOLN
5000.0000 [IU] | Freq: Three times a day (TID) | INTRAMUSCULAR | Status: DC
  Administered 2021-01-12 – 2021-01-19 (×22): 5000 [IU] via SUBCUTANEOUS
  Filled 2021-01-12 (×21): qty 1

## 2021-01-12 MED ORDER — NITROGLYCERIN IN D5W 200-5 MCG/ML-% IV SOLN
0.0000 ug/min | INTRAVENOUS | Status: DC
  Filled 2021-01-12: qty 250

## 2021-01-12 NOTE — Progress Notes (Signed)
Dr. Delton Coombes came to bedside and assessed patient. Wife at bedside. Patient following commands and speaking with MD. Dr. Delton Coombes stated that patient should be on BiPAP overnight. No further orders given.

## 2021-01-12 NOTE — Progress Notes (Signed)
Stanton Kidney, RN paged and spoke with Dr. Tyson Babinski and made MD aware that patient has increased work of breathing, maintaining o2 sats on 3L at 95 % and currently A&Ox4 but drowsy, BP remains elevated on nitro drip which is maxxed out. MD stated he would increase lasix dose order abg and bipap.

## 2021-01-12 NOTE — H&P (Signed)
History and Physical    Micheal Levy:[id number] DOB: [birth year] DOA: 01/12/2021  PCP: Barbette Merino, NP   Patient coming from: Home   Chief Complaint: SOB, cough  HPI: Micheal Levy is a 40 y.o. male with medical history significant for chronic renal insufficiency, type 2 diabetes mellitus, severe hypertension, seizure, history of CVA, substance abuse in remission, now presenting to the emergency department with shortness of breath.  The patient reports 2 days of shortness of breath and cough productive of thick white sputum initially and now brownish sputum.  He denies any fevers, chills, chest pain, or increase in mild chronic leg swelling.  He denies night sweats, chronic cough, hemoptysis, or travel.  He was incarcerated several years ago and has lost some weight recently but reports that this is a result of exercise and dietary changes.  He denies any recent alcohol or substance use.  ED Course: Upon arrival to the ED, patient is found to be afebrile, saturating upper 80s on room air, tachypneic, tachycardic in the 110s, and severely hypertensive.  EKG features sinus tachycardia 315.  Chest x-ray with diffuse nodular parenchymal opacities.  Chest CT demonstrates nodular and confluent infiltrates throughout both lungs.  Chemistry panel with potassium 2.8, creatinine 2.29, and slight elevation in AST.  CBC is unremarkable.  High-sensitivity troponin 135 and then 176.  BNP elevated to 854.  Patient was given 40 mg IV Lasix, Rocephin, azithromycin, oral potassium, nitroglycerin paste, and started on nitroglycerin infusion in the ED.    Review of Systems:  All other systems reviewed and apart from HPI, are negative.  Past Medical History:  Diagnosis Date   Chronic kidney disease    Diabetes mellitus without complication (HCC)    Elevated serum cholesterol 04/2019   Elevated serum creatinine 04/2019   History of seizures    Hyperglycemia    Hypertension    Hypertensive urgency  04/2019   Hypokalemia    Seizures (HCC) 03/2017   x 2    Stroke (HCC) 03/2017   Substance abuse (HCC)    Vitamin D deficiency 04/2019    History reviewed. No pertinent surgical history.  Social History:   reports that he quit smoking about 21 years ago. His smoking use included cigarettes. He has never used smokeless tobacco. He reports current alcohol use. He reports current drug use. Drugs: Cocaine and Marijuana.  No Known Allergies  Family History  Problem Relation Age of Onset   Diabetes Father    Heart attack Father    Diabetes Sister    Hypertension Brother      Prior to Admission medications   Medication Sig Start Date End Date Taking? Authorizing Provider  albuterol (PROVENTIL) (2.5 MG/3ML) 0.083% nebulizer solution Take 3 mLs (2.5 mg total) by nebulization every 6 (six) hours as needed for wheezing or shortness of breath. 04/19/19   [name], FNP  albuterol (VENTOLIN HFA) 108 (90 Base) MCG/ACT inhaler Inhale 2 puffs into the lungs every 6 (six) hours as needed for wheezing or shortness of breath. 04/19/19   [name], FNP  amLODipine (NORVASC) 10 MG tablet TAKE 1 TABLET (10 MG TOTAL) BY MOUTH DAILY. 07/25/20 07/25/21  Barbette Merino, NP  aspirin EC 81 MG tablet Take 1 tablet (81 mg total) by mouth daily. 09/13/19   Rolly Salter, MD  atorvastatin (LIPITOR) 10 MG tablet Take 1 tablet (10 mg total) by mouth daily. 04/25/19   [name], FNP  Blood Glucose Monitoring  Suppl (TRUE METRIX AIR GLUCOSE METER) DEVI 1 each by Does not apply route 4 (four) times daily -  before meals and at bedtime. 04/19/19   [name], FNP  cloNIDine (CATAPRES - DOSED IN MG/24 HR) 0.1 mg/24hr patch Place 1 patch (0.1 mg total) onto the skin once a week. 11/11/19   Barbette Merino, NP  feeding supplement, GLUCERNA SHAKE, (GLUCERNA SHAKE) LIQD Take 237 mLs by mouth 3 (three) times daily between meals. 09/13/19   Rolly Salter, MD  glipiZIDE (GLUCOTROL XL) 10 MG 24 hr tablet  Take 1 tablet (10 mg total) by mouth daily with breakfast. 04/19/19   [name], FNP  glucose blood (TRUE METRIX BLOOD GLUCOSE TEST) test strip Use as instructed 04/19/19   [name], FNP  hydrALAZINE (APRESOLINE) 100 MG tablet TAKE 1 TABLET (100 MG TOTAL) BY MOUTH 3 (THREE) TIMES DAILY. 07/25/20 07/25/21  Barbette Merino, NP  insulin glargine (LANTUS SOLOSTAR) 100 UNIT/ML Solostar Pen Inject 30 Units into the skin daily at 10 pm. 11/11/19 11/10/20  Barbette Merino, NP  Insulin Pen Needle (PEN NEEDLES) 31G X 6 MM MISC 1 each by Does not apply route at bedtime. 10/05/17   Massie Maroon, FNP  INSULIN SYRINGE .5CC/29G (B-D INS SYR ULTRAFINE .5CC/29G) 29G X 1/2" 0.5 ML MISC 1 each by Does not apply route at bedtime. 04/02/17   Massie Maroon, FNP  isosorbide mononitrate (IMDUR) 30 MG 24 hr tablet Take 1 tablet (30 mg total) by mouth daily. 09/14/19   Rolly Salter, MD  Lancets MISC 1 each by Does not apply route 4 (four) times daily -  before meals and at bedtime. 04/19/19   [name], FNP  levETIRAcetam (KEPPRA) 1000 MG tablet TAKE 1 TABLET (1,000 MG TOTAL) BY MOUTH 2 (TWO) TIMES DAILY. 11/11/19 11/10/20  Barbette Merino, NP  metFORMIN (GLUCOPHAGE) 1000 MG tablet TAKE 1 TABLET (1,000 MG TOTAL) BY MOUTH 2 (TWO) TIMES DAILY WITH A MEAL. 07/25/20 07/25/21  [name], FNP  metoprolol tartrate (LOPRESSOR) 50 MG tablet Take 1 tablet (50 mg total) by mouth 2 (two) times daily. 09/13/19   Rolly Salter, MD  pantoprazole (PROTONIX) 40 MG tablet TAKE 1 TABLET (40 MG TOTAL) BY MOUTH DAILY. 12/23/19   Barbette Merino, NP  polyethylene glycol (MIRALAX / GLYCOLAX) 17 g packet Take 17 g by mouth daily. 09/13/19   Rolly Salter, MD  Vitamin D, Ergocalciferol, (DRISDOL) 1.25 MG (50000 UNIT) CAPS capsule Take 1 capsule (50,000 Units total) by mouth every 7 (seven) days. 04/25/19   [name], FNP    Physical Exam: Vitals:   01/12/21 0553 01/12/21 0558 01/12/21 0600 01/12/21 0602  BP: (!)  192/121 (!) 190/130 (!) 193/125 (!) 188/132  Pulse: (!) 102 (!) 105 (!) 104 (!) 102  Resp: (!) 45 (!) 42 (!) 36 (!) 40  Temp:      TempSrc:      SpO2: 96% 97% 97% 97%  Weight:      Height:        Constitutional: NAD, calm  Eyes: PERTLA, lids and conjunctivae normal ENMT: Mucous membranes are moist. Posterior pharynx clear of any exudate or lesions.   Neck: supple, no masses  Respiratory: Tachypneic, increased WOB. Speaking full sentences. Scattered rales bilaterally.    Cardiovascular: Rate ~120 and regular. No extremity edema.  Abdomen: No distension, no tenderness, soft. Bowel sounds active.  Musculoskeletal: no clubbing / cyanosis. No joint  deformity upper and lower extremities.   Skin: no significant rashes, lesions, ulcers. Warm, dry, well-perfused. Neurologic: CN 2-12 grossly intact. Moving all extremities. Alert and oriented.  Psychiatric: Pleasant. Cooperative.    Labs and Imaging on Admission: I have personally reviewed following labs and imaging studies  CBC: Recent Labs  Lab 01/12/21 0202  WBC 8.4  NEUTROABS 7.0  HGB 14.4  HCT 46.3  MCV 80.8  PLT 288   Basic Metabolic Panel: Recent Labs  Lab 01/12/21 0202  NA 134*  K 2.8*  CL 96*  CO2 25  GLUCOSE 169*  BUN 25*  CREATININE 2.29*  CALCIUM 8.8*   GFR: Estimated Creatinine Clearance: 50 mL/min (A) (by C-G formula based on SCr of 2.29 mg/dL (H)). Liver Function Tests: Recent Labs  Lab 01/12/21 0202  AST 43*  ALT 23  ALKPHOS 52  BILITOT 1.3*  PROT 7.4  ALBUMIN 3.3*   No results for input(s): LIPASE, AMYLASE in the last 168 hours. No results for input(s): AMMONIA in the last 168 hours. Coagulation Profile: No results for input(s): INR, PROTIME in the last 168 hours. Cardiac Enzymes: No results for input(s): CKTOTAL, CKMB, CKMBINDEX, TROPONINI in the last 168 hours. BNP (last 3 results) No results for input(s): PROBNP in the last 8760 hours. HbA1C: No results for input(s): HGBA1C in the last  72 hours. CBG: Recent Labs  Lab 01/12/21 0136  GLUCAP 163*   Lipid Profile: No results for input(s): CHOL, HDL, LDLCALC, TRIG, CHOLHDL, LDLDIRECT in the last 72 hours. Thyroid Function Tests: No results for input(s): TSH, T4TOTAL, FREET4, T3FREE, THYROIDAB in the last 72 hours. Anemia Panel: No results for input(s): VITAMINB12, FOLATE, FERRITIN, TIBC, IRON, RETICCTPCT in the last 72 hours. Urine analysis:    Component Value Date/Time   COLORURINE STRAW (A) 09/04/2019 0616   APPEARANCEUR CLEAR 09/04/2019 0616   LABSPEC 1.016 09/04/2019 0616   PHURINE 7.0 09/04/2019 0616   GLUCOSEU >=500 (A) 09/04/2019 0616   HGBUR NEGATIVE 09/04/2019 0616   BILIRUBINUR small 11/11/2019 1206   KETONESUR NEGATIVE 09/04/2019 0616   PROTEINUR Positive (A) 11/11/2019 1206   PROTEINUR >=300 (A) 09/04/2019 0616   UROBILINOGEN 0.2 11/11/2019 1206   UROBILINOGEN 0.2 06/04/2017 1058   NITRITE neg 11/11/2019 1206   NITRITE NEGATIVE 09/04/2019 0616   LEUKOCYTESUR Negative 11/11/2019 1206   LEUKOCYTESUR NEGATIVE 09/04/2019 0616   Sepsis Labs: @LABRCNTIP (procalcitonin:4,lacticidven:4) ) Recent Results (from the past 240 hour(s))  Resp Panel by RT-PCR (Flu A&B, Covid) Nasopharyngeal Swab     Status: None   Collection Time: 01/12/21  2:17 AM   Specimen: Nasopharyngeal Swab; Nasopharyngeal(NP) swabs in vial transport medium  Result Value Ref Range Status   SARS Coronavirus 2 by RT PCR NEGATIVE NEGATIVE Final    Comment: (NOTE) SARS-CoV-2 target nucleic acids are NOT DETECTED.  The SARS-CoV-2 RNA is generally detectable in upper respiratory specimens during the acute phase of infection. The lowest concentration of SARS-CoV-2 viral copies this assay can detect is 138 copies/mL. A negative result does not preclude SARS-Cov-2 infection and should not be used as the sole basis for treatment or other patient management decisions. A negative result may occur with  improper specimen collection/handling,  submission of specimen other than nasopharyngeal swab, presence of viral mutation(s) within the areas targeted by this assay, and inadequate number of viral copies(<138 copies/mL). A negative result must be combined with clinical observations, patient history, and epidemiological information. The expected result is Negative.  Fact Sheet for Patients:  03/14/21  Fact Sheet  for Healthcare Providers:  SeriousBroker.it  This test is no t yet approved or cleared by the [place] and  has been authorized for detection and/or diagnosis of SARS-CoV-2 by FDA under an Emergency Use Authorization (EUA). This EUA will remain  in effect (meaning this test can be used) for the duration of the COVID-19 declaration under Section 564(b)(1) of the Act, 21 U.S.C.section 360bbb-3(b)(1), unless the authorization is terminated  or revoked sooner.       Influenza A by PCR NEGATIVE NEGATIVE Final   Influenza B by PCR NEGATIVE NEGATIVE Final    Comment: (NOTE) The Xpert Xpress SARS-CoV-2/FLU/RSV plus assay is intended as an aid in the diagnosis of influenza from Nasopharyngeal swab specimens and should not be used as a sole basis for treatment. Nasal washings and aspirates are unacceptable for Xpert Xpress SARS-CoV-2/FLU/RSV testing.  Fact Sheet for Patients: BloggerCourse.com  Fact Sheet for Healthcare Providers: SeriousBroker.it  This test is not yet approved or cleared by the [place] FDA and has been authorized for detection and/or diagnosis of SARS-CoV-2 by FDA under an Emergency Use Authorization (EUA). This EUA will remain in effect (meaning this test can be used) for the duration of the COVID-19 declaration under Section 564(b)(1) of the Act, 21 U.S.C. section 360bbb-3(b)(1), unless the authorization is terminated or revoked.  Performed at [facility], [address], [place], [place] [address]      Radiological Exams on Admission: DG Chest 2 View  Result Date: 01/12/2021 CLINICAL DATA:  Shortness of breath starting yesterday. EXAM: CHEST - 2 VIEW COMPARISON:  09/08/2019 FINDINGS: There is a diffuse nodular parenchymal pattern to both lungs consistent with multiple pulmonary nodules. This appears to be progressing since the previous study. Differential diagnosis would include multifocal pneumonia, atypical pneumonia, septic emboli, or developing metastatic disease. CT suggested for further evaluation. No pleural effusions. No pneumothorax. Mediastinal contours appear intact. Cardiac enlargement. IMPRESSION: Diffuse nodular parenchymal pattern to the lungs suggesting multifocal pneumonia versus developing metastatic disease. CT suggested. Electronically Signed   By: Burman Nieves M.D.   On: 01/12/2021 02:00   CT Chest Wo Contrast  Result Date: 01/12/2021 CLINICAL DATA:  Chest pain or shortness of breath. Pleurisy or effusion suspected. Abnormal chest radiograph. EXAM: CT CHEST WITHOUT CONTRAST TECHNIQUE: Multidetector CT imaging of the chest was performed following the standard protocol without IV contrast. COMPARISON:  Chest radiograph 01/12/2021 FINDINGS: Cardiovascular: Diffuse cardiac enlargement. No pericardial effusions. Normal caliber thoracic aorta. Mediastinum/Nodes: Esophagus is decompressed. Thyroid gland is unremarkable. Scattered lymph nodes in the mediastinum are not pathologically enlarged, likely reactive. Lungs/Pleura: Motion artifact limits examination. Diffuse pattern of focal and confluent nodular opacities throughout the lungs, most prominent in the central and upper lung distribution. Some air bronchograms are present within the larger areas of involvement. Changes are most likely to represent multifocal pneumonia. This could represent bacterial, viral, or TB/fungal varieties. Short-term follow-up in 3 months is  recommended to exclude metastatic disease. No pleural effusions. No pneumothorax. Upper Abdomen: No acute abnormalities identified. Musculoskeletal: No chest wall mass or suspicious bone lesions identified. IMPRESSION: Nodular and confluent infiltrates throughout both lungs most likely infectious or inflammatory. Consider multifocal bacterial pneumonia, COVID pneumonia, or TB/fungal disease. Short-term follow-up CT in 3 months is recommended to exclude metastatic disease. Electronically Signed   By: Burman Nieves M.D.   On: 01/12/2021 03:32    EKG: Independently reviewed. Sinus tachycardia, rate 115.   Assessment/Plan   1. Multifocal pneumonia; acute hypoxic respiratory failure -  Presents with 2 days of productive cough and SOB, has elevated BNP and is afebrile with no leukocytosis but CT findings most suggestive of infectious etiology  - No travel, fevers, night sweats, or unintentional wt loss, and sxs were acute-onset  - Rocephin and azithromycin started in ED  - Continue Rocephin and azithromycin, check procalcitonin, sputum culture, and strep pneumo and legionella antigens, continue supplemental O2 as needed    2. Acute on chronic diastolic CHF  - Presents with 2 days of productive cough and SOB, has elevated BNP higher than priors  - EF was preserved in May 2021  - Diuresing well in ED after IV Lasix  - Continue diuresis with 40 mg IV Lasix q12h, follow daily wt and I/Os, update echo    3. Hypertensive urgency  - BP as high as 254/160 in ED, consistently severely elevated per pt report and clinic notes  - Patient not sure about his current antihypertensive medications but no longer using clonidine patch  - Improved with NTG in ED  - Check plasma aldosterone and renin activity, follow-up pharmacy medication reconciliation, titrate NTG infusion    4. Hypokalemia  - Serum potassium is 2.8 on admission  - Replace, repeat chem panel   5. CKD III - SCr is 2.29 on admission, up from  1.61 in August 2021, unclear if this is AKI or progression in CKD  - Renally-dose medications, follow closely while diuresing    6. Seizures  - No seizure in over a yr  - Continue Keppra    7. Type II DM  - A1c was 6.5% in August 2021  - Check CBGs and use SSI for now   8. History of CVA  - Continue statin and ASA    9. Elevated troponin  - HS troponin 135 then 175 in ED, similar to priors - No chest pain, low suspicion for ACS, likely demand ischemia in setting of severe HTN and respiratory failure  - Continue to treat HTN and respiratory failure as above, check echo    DVT prophylaxis: sq heparin  Code Status: Full  Level of Care: Level of care: Stepdown Family Communication: none present  Disposition Plan:  Patient is from: Home  Anticipated d/c is to: Home Anticipated d/c date is: 01/15/21 Patient currently: Pending improvement in respiratory status  Consults called: none  Admission status: Inpatient     Briscoe Deutscher, MD [facility]  01/12/2021, 6:24 AM

## 2021-01-12 NOTE — Progress Notes (Addendum)
Same day note  Patient seen and examined at bedside.  Patient was admitted to the hospital for shortness of breath and cough.  At the time of my evaluation, patient complains of cough and shortness of breath.  Denies any headache nausea vomiting.  Complains of sweats.  Physical examination reveals obese male, in mild distress, nasal cannula oxygen, diminished breath sounds bilaterally with occasional rhonchi.  Coarse breath sounds noted.  Blood pressure extremely elevated  Laboratory data and imaging was reviewed  Assessment and Plan.  Acute hypoxic respiratory failure secondary to multifocal pneumonia and heart failure.. Patient complains of productive cough and shortness of breath.  Has elevated BNP.  Now spiking a fever.  Patient complains of diaphoresis though.  Procalcitonin 0.2.  CRP elevated at 7.4.  On Rocephin and Zithromax. Strep pneumo antigen negative.  HIV nonreactive.  MRSA was negative.  Follow legionella antigens, continue supplemental oxygen.  COVID and influenza was negative.  Negative urine drug screen.   Acute on chronic diastolic CHF likely exacerbated by hypertension Increased cough shortness of breath, elevated BNP.  2D echocardiogram from 2021 May showed preserved LV function.  Continue IV diuretics.  Check chest x-ray in a.m.  Strict intake and output charting Daily weights.  Check echocardiogram again.  Patient is currently on nitroglycerin drip.  Will initiate the patient on oral antihypertensives.  Patient was not taking antihypertensive at home but  Possible hypertensive emergency with heart failure. - BP as high as 254/160 in ED, was taking hydrochlorothiazide but is not compliant with his medication.   Will resume hydralazine amlodipine at low-dose Coreg and continue with clonidine patch.  We will wean nitroglycerin drip as able.  Work-up for hypertension sent.  Hypokalemia. Replenished.  Check levels in a.m. clinic through IV.   Possible AKI on CKD IIIa   Unclear whether this is AKI on CKD or progression in CKD.  Serum creatinine was 2.2 on admission.  Was 1.6 in August 2021.  We will continue to monitor.   History of seizures  No reported seizures in 1 year.  Continue Keppra from home.   Type II DM  Latest A1c was 6.5% in August 2021.  We will get the A1c.  Continue sliding-scale insulin diabetic diet Accu-Cheks.   History of CVA  Continue statins and aspirin.   Elevated troponin  - HS troponin 135 then 175 in ED, similar to priors.  Likely demand ischemia secondary to accelerated hypertension respiratory failure pneumonia and congestive heart failure.  Check 2D echocardiogram.   .Addendum:  01/12/2021 6:32 PM    Patient had worsening dyspnea and increased work of breathing. ABG was obtained which showed hypoxia.  BiPap was initiated, Patient received total of 100 mg of Lasix with limited urine output.  He got 1 dose of p.o. Ativan to help with agitation prior to placing BiPAP.  Currently on BiPAP.  Patient to be mildly sedated at this time but with breathing rate at 40/min. there is possibility that patient could continue to deteriorate and need further oxygenation support. I spoke with Dr. Delton Coombes critical care for further evaluation in case the patient for ICU level of care.  Patient is on IV nitro drip for hypertensive crisis.  Signed,  Tenny Craw, MD [facility]

## 2021-01-12 NOTE — ED Triage Notes (Signed)
Patient here from home reporting SOB that started yesterday. Long medical history including diabetes, substance abuse, seizures, and hypertension. 2L of O2 placed. 91%

## 2021-01-12 NOTE — Progress Notes (Signed)
Sent Dr. Tyson Babinski secure chat and let MD know that patient is restless, frequent cough and wheezing.

## 2021-01-12 NOTE — Consult Note (Signed)
NAME:  Micheal Levy, MRN:  [id number], DOB:  1980-06-09, LOS: 0 ADMISSION DATE:  01/12/2021, CONSULTATION DATE:  10/8 REFERRING MD:  Dr Tyson Babinski, CHIEF COMPLAINT:  Acute respiratory failure   History of Present Illness:  40 year old man with poorly controlled severe hypertension, diabetes, complications including chronic renal failure, seizures, history of CVA.  He came to the emergency department 10/8 with progressive dyspnea, cough productive of initially white and then brownish sputum.  Found to have profound hypertension systolic 250.  Chest x-ray and then CT chest showed bilateral alveolar infiltrates, some which were more nodular and consolidated in nature.  He was diuresed and started on nitroglycerin for blood pressure control.  Also received empiric ceftriaxone and azithromycin. Evening of 10/8 he had some increase in his work to breathe, using accessory abdominal muscles, tachypnea.  Also with some associated lethargy.  He did not have desaturation on monitor ABG showed hypoxemia but no evidence of hypercapnia or acidosis.  BiPAP initiated.  PCCM asked to evaluate.  Pertinent  Medical History   Past Medical History:  Diagnosis Date   Chronic kidney disease    Diabetes mellitus without complication (HCC)    Elevated serum cholesterol 04/2019   Elevated serum creatinine 04/2019   History of seizures    Hyperglycemia    Hypertension    Hypertensive urgency 04/2019   Hypokalemia    Seizures (HCC) 03/2017   x 2    Stroke (HCC) 03/2017   Substance abuse (HCC)    Vitamin D deficiency 04/2019   Significant Hospital Events: Including procedures, antibiotic start and stop dates in addition to other pertinent events   CT chest 10/8 >>   Interim History / Subjective:  Improved respiratory pattern on BiPAP  Objective   Blood pressure (!) 184/103, pulse 95, temperature 99.6 F (37.6 C), temperature source Oral, resp. rate (!) 37, height 5\' 8"  (1.727 m), weight 109.3 kg, SpO2 100  %.        Intake/Output Summary (Last 24 hours) at 01/12/2021 1856 Last data filed at 01/12/2021 1827 Gross per 24 hour  Intake 1784.54 ml  Output 800 ml  Net 984.54 ml   Filed Weights   01/12/21 0532 01/12/21 0841  Weight: 104.3 kg 109.3 kg    Examination: General: Obese man, awake, alert, interacting on BiPAP HENT: Tolerating the BiPAP mask without leak, oropharynx appears to be clear Lungs: Decreased bilateral breath sounds with inspiratory crackles bilaterally Cardiovascular: Distant, regular, no murmur Abdomen: Obese, protuberant, nontender and nondistended with positive bowel sounds Extremities: Trace lower extremity edema Neuro: Awake, alert, interacts appropriately, follows commands  Resolved Hospital Problem list     Assessment & Plan:  Acute hypoxemic respiratory failure in the setting of hypertensive emergency and bilateral pulmonary infiltrates, suspect acute pulmonary edema but consider also CAP Suspected (undiagnosed) obstructive sleep apnea -tolerating BiPAP. Believe he should stay on o/n, then we can assess him for transition off in the am -agree with empiric abx -diuresis as he can tolerate -if he were to require intubation then there would probably be benefit to bronchoscopy for BAL and cx data  Hypertensive emergency with associated diastolic CHF -on NTG gtt.  -target SBP ~180 for first 24h, then can decrease BP goal.  -norvasc, hydralazine, coreg and clonidine patch as ordered  Acute on chronic (stage IIIa) renal failure -follow UOP and BMP -careful diuresis  Diabetes type 2 -ssi as ordered -metformin on hold -restart home lantus when CBG rise  History of seizures -keppra as ordered  History of CVA -ASA  Elevated troponin in the setting of the above -follow serial trop, ECG  Best Practice (right click and "Reselect all SmartList Selections" daily)   Diet/type: Regular consistency (see orders) DVT prophylaxis: prophylactic heparin  GI  prophylaxis: N/A Lines: N/A Foley:  N/A Code Status:  full code Last date of multidisciplinary goals of care discussion [pending]  Labs   CBC: Recent Labs  Lab 01/12/21 0202  WBC 8.4  NEUTROABS 7.0  HGB 14.4  HCT 46.3  MCV 80.8  PLT 288    Basic Metabolic Panel: Recent Labs  Lab 01/12/21 0202 01/12/21 1429  NA 134* 132*  K 2.8* 3.2*  CL 96* 96*  CO2 25 25  GLUCOSE 169* 161*  BUN 25* 28*  CREATININE 2.29* 2.41*  CALCIUM 8.8* 7.8*  MG 1.6*  --    GFR: Estimated Creatinine Clearance: 47.9 mL/min (A) (by C-G formula based on SCr of 2.41 mg/dL (H)). Recent Labs  Lab 01/12/21 0202  PROCALCITON 0.23  WBC 8.4    Liver Function Tests: Recent Labs  Lab 01/12/21 0202  AST 43*  ALT 23  ALKPHOS 52  BILITOT 1.3*  PROT 7.4  ALBUMIN 3.3*   No results for input(s): LIPASE, AMYLASE in the last 168 hours. No results for input(s): AMMONIA in the last 168 hours.  ABG    Component Value Date/Time   PHART 7.466 (H) 01/12/2021 1639   PCO2ART 35.3 01/12/2021 1639   PO2ART 57.7 (L) 01/12/2021 1639   HCO3 25.1 01/12/2021 1639   TCO2 30 09/05/2019 0305   ACIDBASEDEF 0.8 03/24/2017 0330   O2SAT 88.4 01/12/2021 1639     Coagulation Profile: No results for input(s): INR, PROTIME in the last 168 hours.  Cardiac Enzymes: No results for input(s): CKTOTAL, CKMB, CKMBINDEX, TROPONINI in the last 168 hours.  HbA1C: Hemoglobin A1C  Date/Time Value Ref Range Status  11/11/2019 09:03 AM 6.5 (A) 4.0 - 5.6 % Final   Hgb A1c MFr Bld  Date/Time Value Ref Range Status  01/12/2021 06:28 AM 7.1 (H) 4.8 - 5.6 % Final    Comment:    (NOTE) Pre diabetes:          5.7%-6.4%  Diabetes:              >6.4%  Glycemic control for   <7.0% adults with diabetes   09/04/2019 06:22 PM 11.4 (H) 4.8 - 5.6 % Final    Comment:    (NOTE) Pre diabetes:          5.7%-6.4% Diabetes:              >6.4% Glycemic control for   <7.0% adults with diabetes     CBG: Recent Labs  Lab  01/12/21 0136 01/12/21 0801 01/12/21 0855 01/12/21 1214 01/12/21 1638  GLUCAP 163* 142* 158* 140* 158*    Review of Systems:   As per HPI  Past Medical History:  He,  has a past medical history of Chronic kidney disease, Diabetes mellitus without complication (HCC), Elevated serum cholesterol (04/2019), Elevated serum creatinine (04/2019), History of seizures, Hyperglycemia, Hypertension, Hypertensive urgency (04/2019), Hypokalemia, Seizures (HCC) (03/2017), Stroke (HCC) (03/2017), Substance abuse (HCC), and Vitamin D deficiency (04/2019).   Surgical History:  History reviewed. No pertinent surgical history.   Social History:   reports that he quit smoking about 21 years ago. His smoking use included cigarettes. He has never used smokeless tobacco. He reports current alcohol use. He reports current drug use. Drugs: Cocaine and Marijuana.  Family History:  His family history includes Diabetes in his father and sister; Heart attack in his father; Hypertension in his brother.   Allergies No Known Allergies   Home Medications  Prior to Admission medications   Medication Sig Start Date End Date Taking? Authorizing Provider  albuterol (VENTOLIN HFA) 108 (90 Base) MCG/ACT inhaler Inhale 2 puffs into the lungs every 6 (six) hours as needed for wheezing or shortness of breath. 04/19/19  Yes [name], FNP  aspirin EC 81 MG tablet Take 1 tablet (81 mg total) by mouth daily. 09/13/19  Yes Rolly Salter, MD  Blood Glucose Monitoring Suppl (TRUE METRIX AIR GLUCOSE METER) DEVI 1 each by Does not apply route 4 (four) times daily -  before meals and at bedtime. 04/19/19  Yes [name], FNP  glucose blood (TRUE METRIX BLOOD GLUCOSE TEST) test strip Use as instructed Patient taking differently: 1 each by Other route as directed. 04/19/19  Yes [name], FNP  levETIRAcetam (KEPPRA) 1000 MG tablet TAKE 1 TABLET (1,000 MG TOTAL) BY MOUTH 2 (TWO) TIMES DAILY. Patient taking  differently: Take 1,000 mg by mouth 2 (two) times daily. 11/11/19 01/12/21 Yes King, Shana Chute, NP  metFORMIN (GLUCOPHAGE) 1000 MG tablet TAKE 1 TABLET (1,000 MG TOTAL) BY MOUTH 2 (TWO) TIMES DAILY WITH A MEAL. 07/25/20 07/25/21 Yes [name], FNP  Multiple Vitamins-Minerals (MENS ONE DAILY PO) Take 1 tablet by mouth daily.   Yes [provider]  PRESCRIPTION MEDICATION Take 1,000 mg by mouth in the morning and at bedtime. Blood pressure medication. Patient unsure of name but says it's 1000mg  bid.   Yes [provider]  albuterol (PROVENTIL) (2.5 MG/3ML) 0.083% nebulizer solution Take 3 mLs (2.5 mg total) by nebulization every 6 (six) hours as needed for wheezing or shortness of breath. Patient not taking: Reported on 01/12/2021 04/19/19   06/17/19, FNP  amLODipine (NORVASC) 10 MG tablet TAKE 1 TABLET (10 MG TOTAL) BY MOUTH DAILY. Patient not taking: Reported on 01/12/2021 07/25/20 07/25/21  07/27/21, NP  atorvastatin (LIPITOR) 10 MG tablet Take 1 tablet (10 mg total) by mouth daily. Patient not taking: Reported on 01/12/2021 04/25/19   04/27/19, FNP  cloNIDine (CATAPRES - DOSED IN MG/24 HR) 0.1 mg/24hr patch Place 1 patch (0.1 mg total) onto the skin once a week. Patient not taking: Reported on 01/12/2021 11/11/19   01/11/20, NP  feeding supplement, GLUCERNA SHAKE, (GLUCERNA SHAKE) LIQD Take 237 mLs by mouth 3 (three) times daily between meals. Patient not taking: Reported on 01/12/2021 09/13/19   11/13/19, MD  glipiZIDE (GLUCOTROL XL) 10 MG 24 hr tablet Take 1 tablet (10 mg total) by mouth daily with breakfast. Patient not taking: Reported on 01/12/2021 04/19/19   06/17/19, FNP  hydrALAZINE (APRESOLINE) 100 MG tablet TAKE 1 TABLET (100 MG TOTAL) BY MOUTH 3 (THREE) TIMES DAILY. Patient not taking: Reported on 01/12/2021 07/25/20 07/25/21  07/27/21, NP  insulin glargine (LANTUS SOLOSTAR) 100 UNIT/ML Solostar Pen Inject 30 Units into the skin  daily at 10 pm. Patient not taking: Reported on 01/12/2021 11/11/19 11/10/20  01/10/21, NP  Insulin Pen Needle (PEN NEEDLES) 31G X 6 MM MISC 1 each by Does not apply route at bedtime. Patient not taking: Reported on 01/12/2021 10/05/17   12/06/17, FNP  INSULIN SYRINGE .5CC/29G (B-D INS SYR ULTRAFINE .5CC/29G) 29G X 1/2" 0.5 ML MISC 1 each by  Does not apply route at bedtime. Patient not taking: Reported on 01/12/2021 04/02/17   Massie Maroon, FNP  isosorbide mononitrate (IMDUR) 30 MG 24 hr tablet Take 1 tablet (30 mg total) by mouth daily. Patient not taking: Reported on 01/12/2021 09/14/19   Rolly Salter, MD  Lancets MISC 1 each by Does not apply route 4 (four) times daily -  before meals and at bedtime. Patient not taking: Reported on 01/12/2021 04/19/19   [name], FNP  metoprolol tartrate (LOPRESSOR) 50 MG tablet Take 1 tablet (50 mg total) by mouth 2 (two) times daily. Patient not taking: Reported on 01/12/2021 09/13/19   Rolly Salter, MD  pantoprazole (PROTONIX) 40 MG tablet TAKE 1 TABLET (40 MG TOTAL) BY MOUTH DAILY. Patient not taking: Reported on 01/12/2021 12/23/19   Barbette Merino, NP  polyethylene glycol (MIRALAX / GLYCOLAX) 17 g packet Take 17 g by mouth daily. Patient not taking: Reported on 01/12/2021 09/13/19   Rolly Salter, MD  Vitamin D, Ergocalciferol, (DRISDOL) 1.25 MG (50000 UNIT) CAPS capsule Take 1 capsule (50,000 Units total) by mouth every 7 (seven) days. Patient not taking: Reported on 01/12/2021 04/25/19   [name], FNP     Critical care time: 40 minutes     Levy Pupa, MD, PhD 01/12/2021, 8:15 PM [facility] [phone] or if no answer before 7:00PM call [phone] For any issues after 7:00PM please call eLink [phone]

## 2021-01-12 NOTE — ED Provider Notes (Signed)
[facility]-EMERGENCY DEPT Provider Note   CSN: [id number] Arrival date & time: 01/12/21  0119     History Chief Complaint  Patient presents with   Shortness of Breath    Micheal Levy is a 40 y.o. male with a hx of DM, hypertension, CKD, and substance use who presents to the ED with complaints of shortness of breath since yesterday. Patient reports he has had some productive cough & fatigue x 3 days and then yesterday started to feel short of breath. Dyspnea is constant, worse when laying flat, no alleviating factors. No intervention PTA. No sick contacts @ home with similar sxs. Denies fever, chest pain, hemoptysis, acute leg pain/swelling, vomiting, abdominal pain, or syncope. States he take metformin and 1 blood pressure medicine for his health problems, no other meds.   HPI     Past Medical History:  Diagnosis Date   Chronic kidney disease    Diabetes mellitus without complication (HCC)    Elevated serum cholesterol 04/2019   Elevated serum creatinine 04/2019   History of seizures    Hyperglycemia    Hypertension    Hypertensive urgency 04/2019   Hypokalemia    Seizures (HCC) 03/2017   x 2    Stroke (HCC) 03/2017   Substance abuse (HCC)    Vitamin D deficiency 04/2019    Patient Active Problem List   Diagnosis Date Noted   Ankle fracture, bimalleolar, closed, left, initial encounter 01/18/2020   Seizures (HCC) 09/04/2019   Altered mental status    Acute respiratory failure with hypoxemia (HCC)    Hypertensive urgency 04/21/2019   Essential hypertension 04/21/2019   Uncontrolled type 2 diabetes mellitus with hyperglycemia (HCC) 04/21/2019   Hemoglobin A1C between 7% and 9% indicating borderline diabetic control (HCC) 04/21/2019   Incarceration 04/21/2019   History of seizure 04/21/2019   Hypokalemia 04/21/2019   History of substance abuse (HCC) 04/05/2017   Cerebral embolism with cerebral infarction 03/23/2017   Acute renal failure (ARF)  (HCC)    Hypertensive emergency    Ventilator dependent (HCC)    Hyperglycemia 03/21/2017    History reviewed. No pertinent surgical history.     Family History  Problem Relation Age of Onset   Diabetes Father    Heart attack Father    Diabetes Sister    Hypertension Brother     Social History   Tobacco Use   Smoking status: Former    Types: Cigarettes    Quit date: 04/25/1999    Years since quitting: 21.7   Smokeless tobacco: Never   Tobacco comments:    2001  Vaping Use   Vaping Use: Never used  Substance Use Topics   Alcohol use: Yes    Comment: occ, none since Dec 2018   Drug use: Yes    Types: Cocaine, Marijuana    Comment: weed-daily. cocaine- once or twice a month, 05/25/17 avg 1/2 gm daily, no cocaine since Dec 2018     Home Medications Prior to Admission medications   Medication Sig Start Date End Date Taking? Authorizing Provider  albuterol (PROVENTIL) (2.5 MG/3ML) 0.083% nebulizer solution Take 3 mLs (2.5 mg total) by nebulization every 6 (six) hours as needed for wheezing or shortness of breath. 04/19/19   [name], FNP  albuterol (VENTOLIN HFA) 108 (90 Base) MCG/ACT inhaler Inhale 2 puffs into the lungs every 6 (six) hours as needed for wheezing or shortness of breath. 04/19/19   [name], FNP  amLODipine (NORVASC) 10  MG tablet TAKE 1 TABLET (10 MG TOTAL) BY MOUTH DAILY. 07/25/20 07/25/21  Barbette Merino, NP  aspirin EC 81 MG tablet Take 1 tablet (81 mg total) by mouth daily. 09/13/19   Rolly Salter, MD  atorvastatin (LIPITOR) 10 MG tablet Take 1 tablet (10 mg total) by mouth daily. 04/25/19   [name], FNP  Blood Glucose Monitoring Suppl (TRUE METRIX AIR GLUCOSE METER) DEVI 1 each by Does not apply route 4 (four) times daily -  before meals and at bedtime. 04/19/19   [name], FNP  cloNIDine (CATAPRES - DOSED IN MG/24 HR) 0.1 mg/24hr patch Place 1 patch (0.1 mg total) onto the skin once a week. 11/11/19   Barbette Merino, NP   feeding supplement, GLUCERNA SHAKE, (GLUCERNA SHAKE) LIQD Take 237 mLs by mouth 3 (three) times daily between meals. 09/13/19   Rolly Salter, MD  glipiZIDE (GLUCOTROL XL) 10 MG 24 hr tablet Take 1 tablet (10 mg total) by mouth daily with breakfast. 04/19/19   [name], FNP  glucose blood (TRUE METRIX BLOOD GLUCOSE TEST) test strip Use as instructed 04/19/19   [name], FNP  hydrALAZINE (APRESOLINE) 100 MG tablet TAKE 1 TABLET (100 MG TOTAL) BY MOUTH 3 (THREE) TIMES DAILY. 07/25/20 07/25/21  Barbette Merino, NP  insulin glargine (LANTUS SOLOSTAR) 100 UNIT/ML Solostar Pen Inject 30 Units into the skin daily at 10 pm. 11/11/19 11/10/20  Barbette Merino, NP  Insulin Pen Needle (PEN NEEDLES) 31G X 6 MM MISC 1 each by Does not apply route at bedtime. 10/05/17   Massie Maroon, FNP  INSULIN SYRINGE .5CC/29G (B-D INS SYR ULTRAFINE .5CC/29G) 29G X 1/2" 0.5 ML MISC 1 each by Does not apply route at bedtime. 04/02/17   Massie Maroon, FNP  isosorbide mononitrate (IMDUR) 30 MG 24 hr tablet Take 1 tablet (30 mg total) by mouth daily. 09/14/19   Rolly Salter, MD  Lancets MISC 1 each by Does not apply route 4 (four) times daily -  before meals and at bedtime. 04/19/19   [name], FNP  levETIRAcetam (KEPPRA) 1000 MG tablet TAKE 1 TABLET (1,000 MG TOTAL) BY MOUTH 2 (TWO) TIMES DAILY. 11/11/19 11/10/20  Barbette Merino, NP  metFORMIN (GLUCOPHAGE) 1000 MG tablet TAKE 1 TABLET (1,000 MG TOTAL) BY MOUTH 2 (TWO) TIMES DAILY WITH A MEAL. 07/25/20 07/25/21  [name], FNP  metoprolol tartrate (LOPRESSOR) 50 MG tablet Take 1 tablet (50 mg total) by mouth 2 (two) times daily. 09/13/19   Rolly Salter, MD  pantoprazole (PROTONIX) 40 MG tablet TAKE 1 TABLET (40 MG TOTAL) BY MOUTH DAILY. 12/23/19   Barbette Merino, NP  polyethylene glycol (MIRALAX / GLYCOLAX) 17 g packet Take 17 g by mouth daily. 09/13/19   Rolly Salter, MD  Vitamin D, Ergocalciferol, (DRISDOL) 1.25 MG (50000 UNIT) CAPS capsule Take 1  capsule (50,000 Units total) by mouth every 7 (seven) days. 04/25/19   [name], FNP    Allergies    Patient has no known allergies.  Review of Systems   Review of Systems  Constitutional:  Positive for fatigue. Negative for fever.  Respiratory:  Positive for cough and shortness of breath.   Cardiovascular:  Negative for chest pain and leg swelling.  Gastrointestinal:  Negative for abdominal pain, nausea and vomiting.  Neurological:  Negative for syncope.  All other systems reviewed and are negative.  Physical Exam Updated Vital Signs BP [name])  254/158 (BP Location: Left Arm)   Pulse (!) 118   Temp 98.5 F (36.9 C) (Oral)   Resp (!) 32   SpO2 (!) 88%   Physical Exam Vitals and nursing note reviewed.  Constitutional:      General: He is in acute distress.  HENT:     Head: Normocephalic and atraumatic.  Cardiovascular:     Rate and Rhythm: Regular rhythm. Tachycardia present.  Pulmonary:     Effort: Tachypnea present.     Comments: Course breath sounds throughout mid and lower lung fields.  Abdominal:     Palpations: Abdomen is soft.     Tenderness: There is no abdominal tenderness. There is no guarding or rebound.  Musculoskeletal:     Cervical back: Neck supple.     Right lower leg: Edema present.     Left lower leg: Edema present.     Comments: Symmetric pitting edema to bilateral lower extremities.   Skin:    General: Skin is warm and dry.  Neurological:     Mental Status: He is alert.  Psychiatric:        Mood and Affect: Mood normal.    ED Results / Procedures / Treatments   Labs (all labs ordered are listed, but only abnormal results are displayed) Labs Reviewed  COMPREHENSIVE METABOLIC PANEL - Abnormal; Notable for the following components:      Result Value   Sodium 134 (*)    Potassium 2.8 (*)    Chloride 96 (*)    Glucose, Bld 169 (*)    BUN 25 (*)    Creatinine, Ser 2.29 (*)    Calcium 8.8 (*)    Albumin 3.3 (*)    AST 43 (*)    Total  Bilirubin 1.3 (*)    GFR, Estimated 36 (*)    All other components within normal limits  CBC WITH DIFFERENTIAL/PLATELET - Abnormal; Notable for the following components:   MCH 25.1 (*)    RDW 16.6 (*)    All other components within normal limits  BRAIN NATRIURETIC PEPTIDE - Abnormal; Notable for the following components:   B Natriuretic Peptide 853.5 (*)    All other components within normal limits  CBG MONITORING, ED - Abnormal; Notable for the following components:   Glucose-Capillary 163 (*)    All other components within normal limits  TROPONIN I (HIGH SENSITIVITY) - Abnormal; Notable for the following components:   Troponin I (High Sensitivity) 135 (*)    All other components within normal limits  RESP PANEL BY RT-PCR (FLU A&B, COVID) ARPGX2  TROPONIN I (HIGH SENSITIVITY)    EKG EKG Interpretation  Date/Time:  Saturday January 12 2021 01:37:15 EDT Ventricular Rate:  115 PR Interval:  165 QRS Duration: 91 QT Interval:  345 QTC Calculation: 478 R Axis:   30 Text Interpretation: Sinus tachycardia LAE, consider biatrial enlargement Probable left ventricular hypertrophy Abnormal T, consider ischemia, lateral leads Confirmed by Geoffery Lyons ([id number]) on 01/12/2021 2:16:18 AM  Radiology DG Chest 2 View  Result Date: 01/12/2021 CLINICAL DATA:  Shortness of breath starting yesterday. EXAM: CHEST - 2 VIEW COMPARISON:  09/08/2019 FINDINGS: There is a diffuse nodular parenchymal pattern to both lungs consistent with multiple pulmonary nodules. This appears to be progressing since the previous study. Differential diagnosis would include multifocal pneumonia, atypical pneumonia, septic emboli, or developing metastatic disease. CT suggested for further evaluation. No pleural effusions. No pneumothorax. Mediastinal contours appear intact. Cardiac enlargement. IMPRESSION: Diffuse nodular parenchymal pattern to the  lungs suggesting multifocal pneumonia versus developing metastatic disease. CT  suggested. Electronically Signed   By: Burman Nieves M.D.   On: 01/12/2021 02:00   CT Chest Wo Contrast  Result Date: 01/12/2021 CLINICAL DATA:  Chest pain or shortness of breath. Pleurisy or effusion suspected. Abnormal chest radiograph. EXAM: CT CHEST WITHOUT CONTRAST TECHNIQUE: Multidetector CT imaging of the chest was performed following the standard protocol without IV contrast. COMPARISON:  Chest radiograph 01/12/2021 FINDINGS: Cardiovascular: Diffuse cardiac enlargement. No pericardial effusions. Normal caliber thoracic aorta. Mediastinum/Nodes: Esophagus is decompressed. Thyroid gland is unremarkable. Scattered lymph nodes in the mediastinum are not pathologically enlarged, likely reactive. Lungs/Pleura: Motion artifact limits examination. Diffuse pattern of focal and confluent nodular opacities throughout the lungs, most prominent in the central and upper lung distribution. Some air bronchograms are present within the larger areas of involvement. Changes are most likely to represent multifocal pneumonia. This could represent bacterial, viral, or TB/fungal varieties. Short-term follow-up in 3 months is recommended to exclude metastatic disease. No pleural effusions. No pneumothorax. Upper Abdomen: No acute abnormalities identified. Musculoskeletal: No chest wall mass or suspicious bone lesions identified. IMPRESSION: Nodular and confluent infiltrates throughout both lungs most likely infectious or inflammatory. Consider multifocal bacterial pneumonia, COVID pneumonia, or TB/fungal disease. Short-term follow-up CT in 3 months is recommended to exclude metastatic disease. Electronically Signed   By: Burman Nieves M.D.   On: 01/12/2021 03:32    Procedures .Critical Care Performed by: Cherly Anderson, PA-C Authorized by: Cherly Anderson, PA-C    CRITICAL CARE Performed by: Harvie Heck   Total critical care time: 35 minutes  Critical care time was exclusive of  separately billable procedures and treating other patients.  Critical care was necessary to treat or prevent imminent or life-threatening deterioration.  Critical care was time spent personally by me on the following activities: development of treatment plan with patient and/or surrogate as well as nursing, discussions with consultants, evaluation of patient's response to treatment, examination of patient, obtaining history from patient or surrogate, ordering and performing treatments and interventions, ordering and review of laboratory studies, ordering and review of radiographic studies, pulse oximetry and re-evaluation of patient's condition.  Medications Ordered in ED Medications  azithromycin (ZITHROMAX) 500 mg in sodium chloride 0.9 % 250 mL IVPB (500 mg Intravenous New Bag/Given 01/12/21 0459)  nitroGLYCERIN 50 mg in dextrose 5 % 250 mL (0.2 mg/mL) infusion (140 mcg/min Intravenous Rate/Dose Change 01/12/21 0531)  nitroGLYCERIN (NITROGLYN) 2 % ointment 1 inch (1 inch Topical Given 01/12/21 0222)  cefTRIAXone (ROCEPHIN) 1 g in sodium chloride 0.9 % 100 mL IVPB (0 g Intravenous Stopped 01/12/21 0459)  furosemide (LASIX) injection 40 mg (40 mg Intravenous Given 01/12/21 0410)  potassium chloride SA (KLOR-CON) CR tablet 40 mEq (40 mEq Oral Given 01/12/21 0534)    ED Course  I have reviewed the triage vital signs and the nursing notes.  Pertinent labs & imaging results that were available during my care of the patient were reviewed by me and considered in my medical decision making (see chart for details).    MDM Rules/Calculators/A&P                           Patient presents to the ED with complaints of dyspnea. He is markedly hypertensive, tachypneic, and hypoxic on RA- requiring 4L via [place] to maintain SPO2 > 90%. Course breath sounds and LE edema noted.   DDX: Acute chf/pulmonary edema, HTN emergency, pneumonia, covid  19, atypical ACS, PE, critical anemia.   Clinically most suspicious  for acute CHF/pulmonary edema--> nitro paste ordered.   Additional history obtained:  Additional history obtained from chart review & nursing note review.  Chart review:  Echo in May of 2021:  EF 50-55%, severe LVH noted consistent w/ hypertrophic cardiomyopathy, grade II diastolic dysfunction noted.   Lab Tests:  I Ordered, reviewed, and interpreted labs, which included:  CBC: Unremarkable CMP: Increase in creatinine from baseline, electrolyte abnormalities including hypokalemia noted. Troponin: Elevated at 135, has been similar in the past. BNP: Elevated 853.5  Imaging Studies ordered:  I ordered imaging studies which included chest x-ray and subsequently chest CT without contrast as patient's renal function would not allow for CT with contrast, I independently reviewed, formal radiology impression shows:  CXR: Diffuse nodular parenchymal pattern to the lungs suggesting multifocal pneumonia versus developing metastatic disease. CT suggested.  ED Course:  03:20: RE-EVAL: Patient remains tachypneic and hypertensive, BP 246/142 now, not much change with nitro paste.   Chest CT: Nodular and confluent infiltrates throughout both lungs most likely infectious or inflammatory. Consider multifocal bacterial pneumonia, COVID pneumonia, or TB/fungal disease. Short-term follow-up CT in 3 months is recommended to exclude metastatic disease  Overall somewhat atypical presentation, clinically seemed suspicious for acute CHF given his hx, presentation- course breath sounds w/ peripheral edema, and elevated BNP, suspect troponin elevation from demand, however Chest CT more consistent with inflammation/infection- no leukocytosis & afebrile- discussed w/ attending- plan for nitroglycerin drip, lasix, oral potasium, and IV abx.   COVID/flu: Negative  04:57: RE-EVAL: BP improving some, 213/135- overall remains elevated, patient putting out urine and states he is starting to feel much better, he does seem  less tachypneic. Will discuss w/ medicine for admission.   06:26: CONSULT: Discussed with hospitalist Dr. Antionette Char- accepts admission.   This is a shared visit with supervising physician Dr. Judd Lien who has independently evaluated patient & provided guidance in evaluation/management/disposition, in agreement with care   Portions of this note were generated with Dragon dictation software. Dictation errors may occur despite best attempts at proofreading.  Final Clinical Impression(s) / ED Diagnoses Final diagnoses:  Acute hypoxemic respiratory failure [facility])    Rx / DC Orders ED Discharge Orders     None        Cherly Anderson, PA-C 01/12/21 1610    Geoffery Lyons, MD 01/12/21 [phone]

## 2021-01-12 NOTE — Progress Notes (Signed)
Since admission to stepdown RN has made several attempts to find out which MD is attending to this patient today and Dr. Tyson Babinski now at bedside. MD aware that BP is elevated 200's/120's maxxed on nitro drip. MD will address. Initially RN sent secure chat to Dr. Antionette Char who was listed as admitting MD, then Spoke with Dr. Robb Matar who directed RN to Dr. Tyson Babinski.

## 2021-01-12 NOTE — Progress Notes (Signed)
Sent Dr. Tyson Babinski secure chat and let MD know that patient's respiratory rate remains 35-40/min and that patient is lethargic, will respond to voice and follow commands but mentation has deteriorated compared to earlier in shift. MD acknowledged and stated he will come see patient. RN will continue to monitor.

## 2021-01-13 DIAGNOSIS — I5033 Acute on chronic diastolic (congestive) heart failure: Secondary | ICD-10-CM

## 2021-01-13 LAB — GLUCOSE, CAPILLARY
Glucose-Capillary: 116 mg/dL — ABNORMAL HIGH (ref 70–99)
Glucose-Capillary: 169 mg/dL — ABNORMAL HIGH (ref 70–99)
Glucose-Capillary: 118 mg/dL — ABNORMAL HIGH (ref 70–99)
Glucose-Capillary: 101 mg/dL — ABNORMAL HIGH (ref 70–99)

## 2021-01-13 LAB — BASIC METABOLIC PANEL
Anion gap: 12 (ref 5–15)
BUN: 33 mg/dL — ABNORMAL HIGH (ref 6–20)
CO2: 25 mmol/L (ref 22–32)
Calcium: 8.4 mg/dL — ABNORMAL LOW (ref 8.9–10.3)
Chloride: 100 mmol/L (ref 98–111)
Creatinine, Ser: 2.65 mg/dL — ABNORMAL HIGH (ref 0.61–1.24)
GFR, Estimated: 30 mL/min — ABNORMAL LOW (ref >60)
Glucose, Bld: 123 mg/dL — ABNORMAL HIGH (ref 70–99)
Potassium: 3.3 mmol/L — ABNORMAL LOW (ref 3.5–5.1)
Sodium: 137 mmol/L (ref 135–145)

## 2021-01-13 LAB — CBC
HCT: 36.6 % — ABNORMAL LOW (ref 39.0–52.0)
Hemoglobin: 11.6 g/dL — ABNORMAL LOW (ref 13.0–17.0)
MCH: 25.2 pg — ABNORMAL LOW (ref 26.0–34.0)
MCHC: 31.7 g/dL (ref 30.0–36.0)
MCV: 79.4 fL — ABNORMAL LOW (ref 80.0–100.0)
Platelets: 284 10*3/uL (ref 150–400)
RBC: 4.61 MIL/uL (ref 4.22–5.81)
RDW: 16.6 % — ABNORMAL HIGH (ref 11.5–15.5)
WBC: 9.5 10*3/uL (ref 4.0–10.5)
nRBC: 0 % (ref 0.0–0.2)

## 2021-01-13 LAB — PROCALCITONIN: Procalcitonin: 0.81 ng/mL

## 2021-01-13 LAB — MAGNESIUM: Magnesium: 1.7 mg/dL (ref 1.7–2.4)

## 2021-01-13 LAB — PHOSPHORUS: Phosphorus: 3.6 mg/dL (ref 2.5–4.6)

## 2021-01-13 MED ORDER — POTASSIUM CHLORIDE CRYS ER 20 MEQ PO TBCR
40.0000 meq | EXTENDED_RELEASE_TABLET | Freq: Two times a day (BID) | ORAL | Status: DC
  Administered 2021-01-13 (×2): 40 meq via ORAL
  Filled 2021-01-13 (×2): qty 2

## 2021-01-13 MED ORDER — LEVETIRACETAM 500 MG PO TABS
1000.0000 mg | ORAL_TABLET | Freq: Two times a day (BID) | ORAL | Status: DC
  Administered 2021-01-13 – 2021-01-19 (×13): 1000 mg via ORAL
  Filled 2021-01-13 (×13): qty 2

## 2021-01-13 MED ORDER — POTASSIUM CHLORIDE 10 MEQ/100ML IV SOLN
10.0000 meq | INTRAVENOUS | Status: DC
Start: 2021-01-13 — End: 2021-01-13

## 2021-01-13 NOTE — Progress Notes (Signed)
Drowsy, responds to voice and following commands. Patient placed on bipap. Nitro drip restarted due to SBP > 140.

## 2021-01-13 NOTE — Progress Notes (Signed)
PROGRESS NOTE  Micheal Levy ZOX:[id number] DOB: [birth year] DOA: 01/12/2021 PCP: Barbette Merino, NP   LOS: 1 day   Brief narrative: Micheal Levy is a 40 y.o. male with medical history significant f CKD, diabetes mellitus, hypertension history of CVA, seizure disorder history of substance abuse in the past in remission presented to hospital with worsening shortness of breath with cough productive in nature.  In the ED patient was noted to be hypoxic with a pulse ox of 80% on room air, tachypneic tachycardic in the 110s and severely hypertensive.  EKG showed sinus tachycardia.  Chest x-ray showed diffuse nodular parenchymal opacities.  Chest CT demonstrated nodular and confluent infiltrates throughout both lungs.  Initial BMP showed hypokalemia with potassium of 2.8, creatinine was elevated at 2.2 and mildly elevated AST.  CBC was unremarkable.  Patient did have mild elevated troponins.  BNP was elevated at 854.  Patient was given 40 mg of Lasix, IV Rocephin and Zithromax oral potassium Nitropaste and was admitted to the hospital.  He continued to have elevated blood pressure so nitroglycerin drip was initiated.  Assessment/Plan:  Principal Problem:   Acute respiratory failure with hypoxia (HCC) Active Problems:   Cerebral embolism with cerebral infarction   History of substance abuse (HCC)   Hypertensive urgency   Uncontrolled type 2 diabetes mellitus with hyperglycemia (HCC)   Hypokalemia   Seizures (HCC)   Chronic kidney disease, stage 3a (HCC)   Acute on chronic diastolic CHF (congestive heart failure) (HCC)   Elevated troponin  Acute hypoxic respiratory failure secondary to multifocal pneumonia and heart failure, possible underlying obstructive sleep apnea. Patient had increased work of breathing yesterday and was put on BiPAP.  Critical care was consulted for further evaluation.  Pulmonary recommended continuation of BiPAP.  Procalcitonin was 0.2 yesterday and 0.8 today..  CRP  elevated.  On Rocephin and Zithromax.  . Strep pneumo antigen negative.  HIV nonreactive.  MRSA was negative.  Follow legionella urinary antigens. COVID and influenza was negative.  Negative urine drug screen.  Temperature max of 101.5 F.  We will repeat x-ray of the chest.   Acute on chronic diastolic CHF likely exacerbated by hypertensive emergency On nitro drip and multiple oral medications.  We will continue to monitor closely.  2D echocardiogram with preserved LV function.  Patient does have history of not taking oral medications at home.  Currently on hydralazine, amlodipine, added low-dose Coreg and continue with clonidine patch.  We will wean nitroglycerin drip as able.     Hypokalemia. Will continue to replenish.  Patient is on IV diuretics high-dose so we will continue to replenish aggressively.   Possible AKI on CKD IIIa  Unclear whether this is AKI on CKD or progression in CKD.  Serum creatinine was 2.2 on admission.  Was 1.6 in August 2021.  We will continue to monitor.  Serum creatinine is 2.6 today.   History of seizures  No reported seizures in 1 year.  Continue Keppra from home.   Type II DM  Latest A1c was 6.5% in August 2021.  Hemoglobin A1c from 01/12/2021 at 7.1.  Continue sliding-scale insulin diabetic diet Accu-Cheks.   History of CVA  Continue statins and aspirin.   Elevated troponin  - HS troponin 135 then 175 in ED, similar to priors.  Likely demand ischemia secondary to accelerated hypertension, respiratory failure pneumonia and congestive heart failure.  2D echocardiogram from 01/12/2021 showed LV ejection fraction of 55 to 60% with no regional wall motion  abnormality.  Grade 3 diastolic dysfunction was noted.  DVT prophylaxis: heparin injection 5,000 Units Start: 01/12/21 0615   Code Status: Full code  Family Communication: Spoke with the patient's spouse on the phone and updated her about the clinical condition of the patient.  Status is:  Inpatient  Remains inpatient appropriate because:Unsafe d/c plan, IV treatments appropriate due to intensity of illness or inability to take PO, and Inpatient level of care appropriate due to severity of illness  Dispo: The patient is from: Home              Anticipated d/c is to: Home              Patient currently is not medically stable to d/c.   Difficult to place patient No   Consultants: PCCM  Procedures: None  Anti-infectives:  Rocephin and Zithromax IV 10/8>  Anti-infectives (From admission, onward)    Start     Dose/Rate Route Frequency Ordered Stop   01/13/21 1000  cefTRIAXone (ROCEPHIN) 2 g in sodium chloride 0.9 % 100 mL IVPB        2 g 200 mL/hr over 30 Minutes Intravenous Every 24 hours 01/12/21 0635 01/17/21 0959   01/13/21 0400  azithromycin (ZITHROMAX) 500 mg in sodium chloride 0.9 % 250 mL IVPB        500 mg 250 mL/hr over 60 Minutes Intravenous Every 24 hours 01/12/21 0604 01/17/21 0359   01/12/21 2200  cefTRIAXone (ROCEPHIN) 2 g in sodium chloride 0.9 % 100 mL IVPB  Status:  Discontinued        2 g 200 mL/hr over 30 Minutes Intravenous Every 24 hours 01/12/21 0604 01/12/21 0635   01/12/21 1000  cefTRIAXone (ROCEPHIN) 1 g in sodium chloride 0.9 % 100 mL IVPB        1 g 200 mL/hr over 30 Minutes Intravenous  Once 01/12/21 0636 01/12/21 1253   01/12/21 0345  cefTRIAXone (ROCEPHIN) 1 g in sodium chloride 0.9 % 100 mL IVPB        1 g 200 mL/hr over 30 Minutes Intravenous  Once 01/12/21 0338 01/12/21 0459   01/12/21 0345  azithromycin (ZITHROMAX) 500 mg in sodium chloride 0.9 % 250 mL IVPB        500 mg 250 mL/hr over 60 Minutes Intravenous  Once 01/12/21 0338 01/12/21 0636      Subjective: Today, patient was seen and examined at bedside.  Patient continues to feel little better.  Patient denies any chest pain, dizziness, lightheadedness, nausea, vomiting.  Was on BiPAP overnight but on nasal cannula today  Objective: Vitals:   01/13/21 0751 01/13/21  0800  BP:  (!) 155/84  Pulse: 93 91  Resp:  20  Temp:    SpO2:  95%    Intake/Output Summary (Last 24 hours) at 01/13/2021 0802 Last data filed at 01/13/2021 0500 Gross per 24 hour  Intake 1764.34 ml  Output 1700 ml  Net 64.34 ml   Filed Weights   01/12/21 0532 01/12/21 0841 01/13/21 0500  Weight: 104.3 kg 109.3 kg 114.5 kg   Body mass index is 38.38 kg/m.   Physical Exam: GENERAL: Patient is awake and communicative, obese, on nasal cannula oxygen HENT: No scleral pallor or icterus. Pupils equally reactive to light. Oral mucosa is moist NECK: is supple, no gross swelling noted. CHEST: Diminished breath sounds bilaterally, coarse breath sounds, CVS: S1 and S2 heard, no murmur. Regular rate and rhythm.  ABDOMEN: Soft, non-tender, bowel sounds are present. EXTREMITIES:  Bilateral lower extremity edema noted  CNS: Cranial nerves are intact. No focal motor deficits. SKIN: warm and dry without rashes.  Data Review: I have personally reviewed the following laboratory data and studies,  CBC: Recent Labs  Lab 01/12/21 0202 01/13/21 0309  WBC 8.4 9.5  NEUTROABS 7.0  --   HGB 14.4 11.6*  HCT 46.3 36.6*  MCV 80.8 79.4*  PLT 288 284   Basic Metabolic Panel: Recent Labs  Lab 01/12/21 0202 01/12/21 1429 01/13/21 0309  NA 134* 132* 137  K 2.8* 3.2* 3.3*  CL 96* 96* 100  CO2 GLUCOSE 169* 161* 123*  BUN 25* 28* 33*  CREATININE 2.29* 2.41* 2.65*  CALCIUM 8.8* 7.8* 8.4*  MG 1.6*  --  1.7  PHOS  --   --  3.6   Liver Function Tests: Recent Labs  Lab 01/12/21 0202  AST 43*  ALT 23  ALKPHOS 52  BILITOT 1.3*  PROT 7.4  ALBUMIN 3.3*   No results for input(s): LIPASE, AMYLASE in the last 168 hours. No results for input(s): AMMONIA in the last 168 hours. Cardiac Enzymes: No results for input(s): CKTOTAL, CKMB, CKMBINDEX, TROPONINI in the last 168 hours. BNP (last 3 results) Recent Labs    01/12/21 0202  BNP 853.5*    ProBNP (last 3 results) No results  for input(s): PROBNP in the last 8760 hours.  CBG: Recent Labs  Lab 01/12/21 0855 01/12/21 1214 01/12/21 1638 01/12/21 2111 01/13/21 0757  GLUCAP 158* 140* 158* 138* 116*   Recent Results (from the past 240 hour(s))  Resp Panel by RT-PCR (Flu A&B, Covid) Nasopharyngeal Swab     Status: None   Collection Time: 01/12/21  2:17 AM   Specimen: Nasopharyngeal Swab; Nasopharyngeal(NP) swabs in vial transport medium  Result Value Ref Range Status   SARS Coronavirus 2 by RT PCR NEGATIVE NEGATIVE Final    Comment: (NOTE) SARS-CoV-2 target nucleic acids are NOT DETECTED.  The SARS-CoV-2 RNA is generally detectable in upper respiratory specimens during the acute phase of infection. The lowest concentration of SARS-CoV-2 viral copies this assay can detect is 138 copies/mL. A negative result does not preclude SARS-Cov-2 infection and should not be used as the sole basis for treatment or other patient management decisions. A negative result may occur with  improper specimen collection/handling, submission of specimen other than nasopharyngeal swab, presence of viral mutation(s) within the areas targeted by this assay, and inadequate number of viral copies(<138 copies/mL). A negative result must be combined with clinical observations, patient history, and epidemiological information. The expected result is Negative.  Fact Sheet for Patients:  BloggerCourse.com  Fact Sheet for Healthcare Providers:  SeriousBroker.it  This test is no t yet approved or cleared by the [place] FDA and  has been authorized for detection and/or diagnosis of SARS-CoV-2 by FDA under an Emergency Use Authorization (EUA). This EUA will remain  in effect (meaning this test can be used) for the duration of the COVID-19 declaration under Section 564(b)(1) of the Act, 21 U.S.C.section 360bbb-3(b)(1), unless the authorization is terminated  or revoked sooner.        Influenza A by PCR NEGATIVE NEGATIVE Final   Influenza B by PCR NEGATIVE NEGATIVE Final    Comment: (NOTE) The Xpert Xpress SARS-CoV-2/FLU/RSV plus assay is intended as an aid in the diagnosis of influenza from Nasopharyngeal swab specimens and should not be used as a sole basis for treatment. Nasal washings and aspirates are unacceptable for Xpert Xpress  SARS-CoV-2/FLU/RSV testing.  Fact Sheet for Patients: BloggerCourse.com  Fact Sheet for Healthcare Providers: SeriousBroker.it  This test is not yet approved or cleared by the [place] FDA and has been authorized for detection and/or diagnosis of SARS-CoV-2 by FDA under an Emergency Use Authorization (EUA). This EUA will remain in effect (meaning this test can be used) for the duration of the COVID-19 declaration under Section 564(b)(1) of the Act, 21 U.S.C. section 360bbb-3(b)(1), unless the authorization is terminated or revoked.  Performed at [facility], [address] [place], [place] [address]   MRSA Next Gen by PCR, Nasal     Status: None   Collection Time: 01/12/21  8:41 AM  Result Value Ref Range Status   MRSA by PCR Next Gen NOT DETECTED NOT DETECTED Final    Comment: (NOTE) The GeneXpert MRSA Assay (FDA approved for NASAL specimens only), is one component of a comprehensive MRSA colonization surveillance program. It is not intended to diagnose MRSA infection nor to guide or monitor treatment for MRSA infections. Test performance is not FDA approved in patients less than 22 years old. Performed at [facility], [address], [place], [place] [address]      Studies: DG Chest 2 View  Result Date: 01/12/2021 CLINICAL DATA:  Shortness of breath starting yesterday. EXAM: CHEST - 2 VIEW COMPARISON:  09/08/2019 FINDINGS: There is a diffuse nodular parenchymal pattern to both lungs consistent with multiple pulmonary  nodules. This appears to be progressing since the previous study. Differential diagnosis would include multifocal pneumonia, atypical pneumonia, septic emboli, or developing metastatic disease. CT suggested for further evaluation. No pleural effusions. No pneumothorax. Mediastinal contours appear intact. Cardiac enlargement. IMPRESSION: Diffuse nodular parenchymal pattern to the lungs suggesting multifocal pneumonia versus developing metastatic disease. CT suggested. Electronically Signed   By: Burman Nieves M.D.   On: 01/12/2021 02:00   CT Chest Wo Contrast  Result Date: 01/12/2021 CLINICAL DATA:  Chest pain or shortness of breath. Pleurisy or effusion suspected. Abnormal chest radiograph. EXAM: CT CHEST WITHOUT CONTRAST TECHNIQUE: Multidetector CT imaging of the chest was performed following the standard protocol without IV contrast. COMPARISON:  Chest radiograph 01/12/2021 FINDINGS: Cardiovascular: Diffuse cardiac enlargement. No pericardial effusions. Normal caliber thoracic aorta. Mediastinum/Nodes: Esophagus is decompressed. Thyroid gland is unremarkable. Scattered lymph nodes in the mediastinum are not pathologically enlarged, likely reactive. Lungs/Pleura: Motion artifact limits examination. Diffuse pattern of focal and confluent nodular opacities throughout the lungs, most prominent in the central and upper lung distribution. Some air bronchograms are present within the larger areas of involvement. Changes are most likely to represent multifocal pneumonia. This could represent bacterial, viral, or TB/fungal varieties. Short-term follow-up in 3 months is recommended to exclude metastatic disease. No pleural effusions. No pneumothorax. Upper Abdomen: No acute abnormalities identified. Musculoskeletal: No chest wall mass or suspicious bone lesions identified. IMPRESSION: Nodular and confluent infiltrates throughout both lungs most likely infectious or inflammatory. Consider multifocal bacterial  pneumonia, COVID pneumonia, or TB/fungal disease. Short-term follow-up CT in 3 months is recommended to exclude metastatic disease. Electronically Signed   By: Burman Nieves M.D.   On: 01/12/2021 03:32   ECHOCARDIOGRAM COMPLETE  Result Date: 01/12/2021    ECHOCARDIOGRAM REPORT   Patient Name:   Micheal Levy Date of Exam: 01/12/2021 Medical Rec #:  [id number]       Height:       69.0 in Accession #:    [id number]      Weight:  230.0 lb Date of Birth:  1981-01-12       BSA:          2.192 m Patient Age:    40 years        BP:           171/132 mmHg Patient Gender: M               HR:           101 bpm. Exam Location:  Inpatient Procedure: 2D Echo, Cardiac Doppler, Color Doppler and Intracardiac            Opacification Agent Indications:    CHF-Acute Diastolic I50.31                 Elevated Troponin  History:        Patient has prior history of Echocardiogram examinations, most                 recent 09/04/2019. Stroke, Signs/Symptoms:Shortness of Breath;                 Risk Factors:Hypertension and Diabetes. Chronic renal                 insufficiency. Chronic Kidney Disease. Tachypneic, tachycardic,                 very hypertensive. Multifocal pneumonia; acute hypoxic                 respiratory failure. Acute on chronic diastolic CHF.  Sonographer:    Leta Jungling RDCS Referring Phys: [id number] TIMOTHY S OPYD  Sonographer Comments: Images hard to obtain due to dyspnea. Images obtained with the patient upright for comfort. IMPRESSIONS  1. Left ventricular ejection fraction, by estimation, is 55 to 60%. The left ventricle has normal function. The left ventricle has no regional wall motion abnormalities. There is moderate concentric left ventricular hypertrophy. Left ventricular diastolic parameters are consistent with Grade III diastolic dysfunction (restrictive). Elevated left atrial pressure.  2. Right ventricular systolic function was not well visualized. The right ventricular size is not well  visualized. There is moderately elevated pulmonary artery systolic pressure. The estimated right ventricular systolic pressure is 56.0 mmHg.  3. Left atrial size was mild to moderately dilated.  4. The mitral valve is normal in structure. No evidence of mitral valve regurgitation.  5. The aortic valve is tricuspid. Aortic valve regurgitation is not visualized. No aortic stenosis is present.  6. The inferior vena cava is dilated in size with <50% respiratory variability, suggesting right atrial pressure of 15 mmHg. FINDINGS  Left Ventricle: Left ventricular ejection fraction, by estimation, is 55 to 60%. The left ventricle has normal function. The left ventricle has no regional wall motion abnormalities. Definity contrast agent was given IV to delineate the left ventricular  endocardial borders. The left ventricular internal cavity size was normal in size. There is moderate concentric left ventricular hypertrophy. Left ventricular diastolic parameters are consistent with Grade III diastolic dysfunction (restrictive). Elevated left atrial pressure. Right Ventricle: The right ventricular size is not well visualized. Right vetricular wall thickness was not well visualized. Right ventricular systolic function was not well visualized. There is moderately elevated pulmonary artery systolic pressure. The  tricuspid regurgitant velocity is 3.20 m/s, and with an assumed right atrial pressure of 15 mmHg, the estimated right ventricular systolic pressure is 56.0 mmHg. Left Atrium: Left atrial size was mild to moderately dilated. Right Atrium: Right atrial size was not well visualized.  Pericardium: There is no evidence of pericardial effusion. Mitral Valve: The mitral valve is normal in structure. No evidence of mitral valve regurgitation. Tricuspid Valve: The tricuspid valve is normal in structure. Tricuspid valve regurgitation is mild. Aortic Valve: The aortic valve is tricuspid. Aortic valve regurgitation is not visualized.  No aortic stenosis is present. Pulmonic Valve: The pulmonic valve was grossly normal. Pulmonic valve regurgitation is not visualized. Aorta: The aortic root and ascending aorta are structurally normal, with no evidence of dilitation. Venous: The inferior vena cava is dilated in size with less than 50% respiratory variability, suggesting right atrial pressure of 15 mmHg. IAS/Shunts: No atrial level shunt detected by color flow Doppler.  LEFT VENTRICLE PLAX 2D LVIDd:         5.60 cm     Diastology LVIDs:         4.60 cm     LV e' medial:    5.66 cm/s LV PW:         1.40 cm     LV E/e' medial:  18.6 LV IVS:        1.50 cm     LV e' lateral:   6.31 cm/s LVOT diam:     2.30 cm     LV E/e' lateral: 16.6 LV SV:         57 LV SV Index:   26 LVOT Area:     4.15 cm  LV Volumes (MOD) LV vol d, MOD A2C: 73.3 ml LV vol d, MOD A4C: 83.9 ml LV vol s, MOD A2C: 32.3 ml LV vol s, MOD A4C: 34.1 ml LV SV MOD A2C:     41.0 ml LV SV MOD A4C:     49.8 ml LV SV MOD BP:      46.9 ml LEFT ATRIUM         Index LA diam:    4.20 cm 1.92 cm/m  AORTIC VALVE LVOT Vmax:   113.00 cm/s LVOT Vmean:  80.900 cm/s LVOT VTI:    0.138 m  AORTA Ao Root diam: 3.30 cm MITRAL VALVE                TRICUSPID VALVE MV Area (PHT): 5.75 cm     TR Peak grad:   41.0 mmHg MV Decel Time: 132 msec     TR Vmax:        320.00 cm/s MV E velocity: 105.00 cm/s MV A velocity: 39.80 cm/s   SHUNTS MV E/A ratio:  2.64         Systemic VTI:  0.14 m                             Systemic Diam: 2.30 cm [name] [name] MD Electronically signed by Thurmon Fair MD Signature Date/Time: 01/12/2021/12:11:16 PM    Final       Joycelyn Das, MD  [facility] 01/13/2021  If 7PM-7AM, please contact night-coverage

## 2021-01-13 NOTE — Progress Notes (Signed)
Pt is off the bipap at this time to eat. No resp distress noted. Pt is sitting comfortably. RN is aware. RT will come back in few hours to place him back on the bipap.

## 2021-01-13 NOTE — Progress Notes (Signed)
NAME:  Micheal Levy, MRN:  [id number], DOB:  14-Dec-1980, LOS: 1 ADMISSION DATE:  01/12/2021, CONSULTATION DATE:  10/8 REFERRING MD:  Dr Tyson Babinski, CHIEF COMPLAINT:  Acute respiratory failure   History of Present Illness:  40 year old man with poorly controlled severe hypertension, diabetes, complications including chronic renal failure, seizures, history of CVA.  He came to the emergency department 10/8 with progressive dyspnea, cough productive of initially white and then brownish sputum.  Found to have profound hypertension systolic 250.  Chest x-ray and then CT chest showed bilateral alveolar infiltrates, some which were more nodular and consolidated in nature.  He was diuresed and started on nitroglycerin for blood pressure control.  Also received empiric ceftriaxone and azithromycin. Evening of 10/8 he had some increase in his work to breathe, using accessory abdominal muscles, tachypnea.  Also with some associated lethargy.  He did not have desaturation on monitor ABG showed hypoxemia but no evidence of hypercapnia or acidosis.  BiPAP initiated.  PCCM asked to evaluate.  Pertinent  Medical History   Past Medical History:  Diagnosis Date   Chronic kidney disease    Diabetes mellitus without complication (HCC)    Elevated serum cholesterol 04/2019   Elevated serum creatinine 04/2019   History of seizures    Hyperglycemia    Hypertension    Hypertensive urgency 04/2019   Hypokalemia    Seizures (HCC) 03/2017   x 2    Stroke (HCC) 03/2017   Substance abuse (HCC)    Vitamin D deficiency 04/2019   Significant Hospital Events: Including procedures, antibiotic start and stop dates in addition to other pertinent events   CT chest 10/8 >> bilateral focal alveolar opacities, nodular with some areas of confluence and consolidation.  Interim History / Subjective:   Wore BiPAP overnight without incident, transition to 2 L/min nasal cannula this morning SBP 136-166 through the night S Cr  2.29 > 2.41 > 2.65; UOP 1700cc / 24h; I/O + 455cc total   Objective   Blood pressure (!) 166/91, pulse 93, temperature 98.3 F (36.8 C), temperature source Oral, resp. rate (!) 36, height 5\' 8"  (1.727 m), weight 114.5 kg, SpO2 96 %.        Intake/Output Summary (Last 24 hours) at 01/13/2021 0752 Last data filed at 01/13/2021 0500 Gross per 24 hour  Intake 1764.34 ml  Output 1700 ml  Net 64.34 ml   Filed Weights   01/12/21 0532 01/12/21 0841 01/13/21 0500  Weight: 104.3 kg 109.3 kg 114.5 kg    Examination: General: Obese man, awake, alert, interacting on BiPAP HENT: Tolerating the BiPAP mask without leak, oropharynx appears to be clear Lungs: Decreased bilateral breath sounds with inspiratory crackles bilaterally Cardiovascular: Distant, regular, no murmur Abdomen: Obese, protuberant, nontender and nondistended with positive bowel sounds Extremities: Trace lower extremity edema Neuro: Awake, alert, interacts appropriately, follows commands  Resolved Hospital Problem list     Assessment & Plan:  Acute hypoxemic respiratory failure in the setting of hypertensive emergency and bilateral pulmonary infiltrates, suspect acute pulmonary edema but consider also CAP, other causes infiltrates Suspected (undiagnosed) obstructive sleep apnea -Benefited from BiPAP overnight, now stable on 2 L/min.  Wean as able.  Suspect he would benefit from nocturnal CPAP given probable diagnosis of OSA.  He will need a formal sleep study as an outpatient -Agree with empiric antibiotics, although low threshold to discontinue if his chest x-ray rapidly clears with diuresis, blood pressure control -If his pulmonary infiltrates persist then he may need repeat  CT chest, consideration of other diagnostics including possible bronchoscopy  Hypertensive emergency with associated diastolic CHF -Remains on nitroglycerin drip, goal weaned off as he is maintenance BP regimen is adjusted -Okay to decrease target SBP  to 140 on 10/9 -Currently on amlodipine, hydralazine, carvedilol, clonidine patch.  May need further adjustment to obtain adequate control  Acute on chronic (stage IIIa) renal failure -Following BMP, urine output -Gentle diuresis  Diabetes type 2 -Slight scale insulin as ordered -Metformin on hold -We will need to restart his home Lantus as he begins to take nutrition, CBGs rise  History of seizures -Continue home Keppra  History of CVA -On ASA  Elevated troponin in the setting of the above -Following serial troponin, suspect stress and hypertension related  Best Practice (right click and "Reselect all SmartList Selections" daily)   Diet/type: Regular consistency (see orders) DVT prophylaxis: prophylactic heparin  GI prophylaxis: N/A Lines: N/A Foley:  N/A Code Status:  full code Last date of multidisciplinary goals of care discussion [pending] Family: Wife updated at bedside 10/8, patient updated 10/9  Labs   CBC: Recent Labs  Lab 01/12/21 0202 01/13/21 0309  WBC 8.4 9.5  NEUTROABS 7.0  --   HGB 14.4 11.6*  HCT 46.3 36.6*  MCV 80.8 79.4*  PLT 288 284    Basic Metabolic Panel: Recent Labs  Lab 01/12/21 0202 01/12/21 1429 01/13/21 0309  NA 134* 132* 137  K 2.8* 3.2* 3.3*  CL 96* 96* 100  CO2 25 25 25   GLUCOSE 169* 161* 123*  BUN 25* 28* 33*  CREATININE 2.29* 2.41* 2.65*  CALCIUM 8.8* 7.8* 8.4*  MG 1.6*  --  1.7  PHOS  --   --  3.6   GFR: Estimated Creatinine Clearance: 44.6 mL/min (A) (by C-G formula based on SCr of 2.65 mg/dL (H)). Recent Labs  Lab 01/12/21 0202 01/13/21 0309  PROCALCITON 0.23 0.81  WBC 8.4 9.5    Liver Function Tests: Recent Labs  Lab 01/12/21 0202  AST 43*  ALT 23  ALKPHOS 52  BILITOT 1.3*  PROT 7.4  ALBUMIN 3.3*   No results for input(s): LIPASE, AMYLASE in the last 168 hours. No results for input(s): AMMONIA in the last 168 hours.  ABG    Component Value Date/Time   PHART 7.466 (H) 01/12/2021 1639    PCO2ART 35.3 01/12/2021 1639   PO2ART 57.7 (L) 01/12/2021 1639   HCO3 25.1 01/12/2021 1639   TCO2 30 09/05/2019 0305   ACIDBASEDEF 0.8 03/24/2017 0330   O2SAT 88.4 01/12/2021 1639     Coagulation Profile: No results for input(s): INR, PROTIME in the last 168 hours.  Cardiac Enzymes: No results for input(s): CKTOTAL, CKMB, CKMBINDEX, TROPONINI in the last 168 hours.  HbA1C: Hemoglobin A1C  Date/Time Value Ref Range Status  11/11/2019 09:03 AM 6.5 (A) 4.0 - 5.6 % Final   Hgb A1c MFr Bld  Date/Time Value Ref Range Status  01/12/2021 06:28 AM 7.1 (H) 4.8 - 5.6 % Final    Comment:    (NOTE) Pre diabetes:          5.7%-6.4%  Diabetes:              >6.4%  Glycemic control for   <7.0% adults with diabetes   09/04/2019 06:22 PM 11.4 (H) 4.8 - 5.6 % Final    Comment:    (NOTE) Pre diabetes:          5.7%-6.4% Diabetes:              >  6.4% Glycemic control for   <7.0% adults with diabetes     CBG: Recent Labs  Lab 01/12/21 0801 01/12/21 0855 01/12/21 1214 01/12/21 1638 01/12/21 2111  GLUCAP 142* 158* 140* 158* 138*     Critical care time: NA     Levy Pupa, MD, PhD 01/13/2021, 7:52 AM [facility] [phone] or if no answer before 7:00PM call [phone] For any issues after 7:00PM please call eLink [phone]

## 2021-01-13 NOTE — Progress Notes (Signed)
PHARMACIST - PHYSICIAN COMMUNICATION  DR:   Tyson Babinski  CONCERNING: IV to Oral Route Change Policy  RECOMMENDATION: This patient is receiving Keppra by the intravenous route.  Based on criteria approved by the Pharmacy and Therapeutics Committee, the intravenous medication(s) is/are being converted to the equivalent oral dose form(s).   DESCRIPTION: These criteria include: The patient is eating (either orally or via tube) and/or has been taking other orally administered medications for a least 24 hours The patient has no evidence of active gastrointestinal bleeding or impaired GI absorption (gastrectomy, short bowel, patient on TNA or NPO).  If you have questions about this conversion, please contact the Pharmacy Department  []   [phone] )  ( [phone] []   [phone] )  [facility] []   [phone] )  [facility] []   [phone] )  [facility] [x]   [phone] )  [facility]   ( [phone], PharmD, BCPS 01/13/2021 10:18 AM

## 2021-01-14 ENCOUNTER — Inpatient Hospital Stay (HOSPITAL_COMMUNITY): Payer: Medicaid Other

## 2021-01-14 DIAGNOSIS — J189 Pneumonia, unspecified organism: Secondary | ICD-10-CM

## 2021-01-14 LAB — BASIC METABOLIC PANEL
Anion gap: 10 (ref 5–15)
BUN: 40 mg/dL — ABNORMAL HIGH (ref 6–20)
CO2: 27 mmol/L (ref 22–32)
Calcium: 8.5 mg/dL — ABNORMAL LOW (ref 8.9–10.3)
Chloride: 103 mmol/L (ref 98–111)
Creatinine, Ser: 2.92 mg/dL — ABNORMAL HIGH (ref 0.61–1.24)
GFR, Estimated: 27 mL/min — ABNORMAL LOW (ref >60)
Glucose, Bld: 135 mg/dL — ABNORMAL HIGH (ref 70–99)
Potassium: 3.1 mmol/L — ABNORMAL LOW (ref 3.5–5.1)
Sodium: 140 mmol/L (ref 135–145)

## 2021-01-14 LAB — GLUCOSE, CAPILLARY
Glucose-Capillary: 127 mg/dL — ABNORMAL HIGH (ref 70–99)
Glucose-Capillary: 126 mg/dL — ABNORMAL HIGH (ref 70–99)
Glucose-Capillary: 154 mg/dL — ABNORMAL HIGH (ref 70–99)
Glucose-Capillary: 103 mg/dL — ABNORMAL HIGH (ref 70–99)

## 2021-01-14 LAB — MAGNESIUM: Magnesium: 1.9 mg/dL (ref 1.7–2.4)

## 2021-01-14 LAB — CBC
HCT: 35.5 % — ABNORMAL LOW (ref 39.0–52.0)
Hemoglobin: 11.2 g/dL — ABNORMAL LOW (ref 13.0–17.0)
MCH: 25.6 pg — ABNORMAL LOW (ref 26.0–34.0)
MCHC: 31.5 g/dL (ref 30.0–36.0)
MCV: 81.1 fL (ref 80.0–100.0)
Platelets: 300 10*3/uL (ref 150–400)
RBC: 4.38 MIL/uL (ref 4.22–5.81)
RDW: 16.7 % — ABNORMAL HIGH (ref 11.5–15.5)
WBC: 10.7 10*3/uL — ABNORMAL HIGH (ref 4.0–10.5)
nRBC: 0 % (ref 0.0–0.2)

## 2021-01-14 LAB — LEGIONELLA PNEUMOPHILA SEROGP 1 UR AG: L. pneumophila Serogp 1 Ur Ag: NEGATIVE

## 2021-01-14 LAB — TROPONIN I (HIGH SENSITIVITY)
Troponin I (High Sensitivity): 128 ng/L (ref <18)
Troponin I (High Sensitivity): 118 ng/L (ref <18)

## 2021-01-14 LAB — PROCALCITONIN: Procalcitonin: 0.81 ng/mL

## 2021-01-14 MED ORDER — CHLORHEXIDINE GLUCONATE 0.12 % MT SOLN
15.0000 mL | Freq: Two times a day (BID) | OROMUCOSAL | Status: DC
  Administered 2021-01-14 – 2021-01-19 (×7): 15 mL via OROMUCOSAL
  Filled 2021-01-14 (×7): qty 15

## 2021-01-14 MED ORDER — AZITHROMYCIN 250 MG PO TABS
500.0000 mg | ORAL_TABLET | Freq: Every day | ORAL | Status: AC
  Administered 2021-01-15 – 2021-01-16 (×2): 500 mg via ORAL
  Filled 2021-01-14 (×2): qty 2

## 2021-01-14 MED ORDER — CARVEDILOL 6.25 MG PO TABS: 6.2500 mg | ORAL_TABLET | Freq: Two times a day (BID) | ORAL | Status: DC

## 2021-01-14 MED ORDER — METOPROLOL TARTRATE 25 MG PO TABS: 50.0000 mg | ORAL_TABLET | Freq: Two times a day (BID) | ORAL | Status: DC

## 2021-01-14 MED ORDER — POTASSIUM CHLORIDE CRYS ER 20 MEQ PO TBCR
40.0000 meq | EXTENDED_RELEASE_TABLET | Freq: Three times a day (TID) | ORAL | Status: AC
  Administered 2021-01-14 – 2021-01-15 (×4): 40 meq via ORAL
  Filled 2021-01-14 (×4): qty 2

## 2021-01-14 MED ORDER — METOPROLOL TARTRATE 25 MG PO TABS
100.0000 mg | ORAL_TABLET | Freq: Two times a day (BID) | ORAL | Status: DC
  Administered 2021-01-14 (×2): 100 mg via ORAL
  Filled 2021-01-14 (×2): qty 4

## 2021-01-14 NOTE — Progress Notes (Signed)
PHARMACIST - PHYSICIAN COMMUNICATION  CONCERNING: Antibiotic IV to Oral Route Change Policy  RECOMMENDATION: This patient is receiving azithromycin by the intravenous route.  Based on criteria approved by the Pharmacy and Therapeutics Committee, the antibiotic(s) is/are being converted to the equivalent oral dose form(s).   DESCRIPTION: These criteria include:  Patient being treated for a respiratory tract infection, urinary tract infection, cellulitis or clostridium difficile associated diarrhea if on metronidazole  The patient is not neutropenic and does not exhibit a GI malabsorption state  The patient is eating (either orally or via tube) and/or has been taking other orally administered medications for a least 24 hours  The patient is improving clinically and has a Tmax < 100.5  If you have questions about this conversion, please contact the Pharmacy Department  []  ( [phone] )  [facility] []  ( [phone] )  [facility] []  ( [phone] )  [facility] []  ( [phone] )  [facility] [x]  ( [phone] )  [facility]  

## 2021-01-14 NOTE — Progress Notes (Signed)
PROGRESS NOTE  Micheal Levy IEP:[id number] DOB: [birth year] DOA: 01/12/2021 PCP: Barbette Merino, NP   LOS: 2 days   Brief narrative: Micheal Levy is a 40 y.o. male with medical history significant for CKD, diabetes mellitus, hypertension history of CVA, seizure disorder history of substance abuse in the past in remission presented to hospital with worsening shortness of breath with cough productive in nature.  In the ED, patient was noted to be hypoxic with a pulse ox of 80% on room air, tachypneic tachycardic in the 110s and severely hypertensive.  EKG showed sinus tachycardia.  Chest x-ray showed diffuse nodular parenchymal opacities.  Chest CT demonstrated nodular and confluent infiltrates throughout both lungs.  Initial BMP showed hypokalemia with potassium of 2.8, creatinine was elevated at 2.2 and mildly elevated AST.  CBC was unremarkable.  Patient did have mild elevated troponins.  BNP was elevated at 854.  Patient was given 40 mg of Lasix, IV Rocephin and Zithromax oral potassium Nitropaste and was admitted to the hospital.  He continued to have elevated blood pressure so nitroglycerin drip was initiated.  During hospitalization, patient had acute respiratory distress and was put on BiPAP.  He was transferred to stepdown unit with nitro drip and BiPAP.  Pulmonary critical care was consulted as well.  Assessment/Plan:  Principal Problem:   Acute respiratory failure with hypoxia (HCC) Active Problems:   Cerebral embolism with cerebral infarction   History of substance abuse (HCC)   Hypertensive urgency   Uncontrolled type 2 diabetes mellitus with hyperglycemia (HCC)   Hypokalemia   Seizures (HCC)   Chronic kidney disease, stage 3a (HCC)   Acute on chronic diastolic CHF (congestive heart failure) (HCC)   Elevated troponin  Acute hypoxic respiratory failure secondary to multifocal pneumonia and heart failure, possible underlying obstructive sleep apnea. Quired BiPAP for  respiratory failure and used over the night.  Currently on nasal cannula oxygen. On Rocephin and Zithromax.  . Strep pneumo antigen negative.  HIV nonreactive.  MRSA was negative.  Ending legionella urinary antigens. COVID and influenza was negative.  Negative urine drug screen.  Temperature max of 100.6 F.  Chest x-ray showed persistent pneumonia without substantial change.   Acute on chronic diastolic CHF likely exacerbated by hypertensive emergency On nitro drip and multiple oral medications.  We will continue to monitor closely.  2D echocardiogram with preserved LV function.  She was not taking medications at home.  Currently on hydralazine, amlodipine, added low-dose Coreg and continue with clonidine patch.  We will wean nitroglycerin drip as able.     Hypokalemia. Potassium level of 3.1.  We will continue to monitor closely.  We will replenish potassium.  On potassium supplements.   Possible AKI on CKD IIIa    Serum creatinine was 2.2 on admission.  Was 1.6 in August 2021.  We will continue to monitor.  Serum creatinine is 2.9 today.  We will need to strict intake and output charting.  We will get renal artery duplex due to accelerated hypertension.  Might need nephrology evaluation if worsening renal function.   History of seizures  No reported seizures in 1 year.  Continue Keppra from home.   Type II DM  Latest hemoglobin A1c was 6.5% in August 2021.  Hemoglobin A1c from 01/12/2021 at 7.1.  Continue sliding-scale insulin, diabetic diet, Accu-Cheks.  Latest blood glucose level of 127   History of CVA  Continue statins and aspirin.   Elevated troponin  - HS troponin 135 then 175  in ED, similar to priors.  Likely demand ischemia secondary to accelerated hypertension, respiratory failure pneumonia and congestive heart failure.  2D echocardiogram from 01/12/2021 showed LV ejection fraction of 55 to 60% with no regional wall motion abnormality.  Grade 3 diastolic dysfunction was noted.  DVT  prophylaxis: heparin injection 5,000 Units Start: 01/12/21 0615   Code Status: Full code  Family Communication:  None today.  Spoke with the patient's spouse on the phone yesterday.  Status is: Inpatient  Remains inpatient appropriate because:Unsafe d/c plan, IV treatments appropriate due to intensity of illness or inability to take PO, and Inpatient level of care appropriate due to severity of illness  Dispo: The patient is from: Home              Anticipated d/c is to: Home              Patient currently is not medically stable to d/c.   Difficult to place patient No  Consultants: PCCM  Procedures: None  Anti-infectives:  Rocephin and Zithromax IV 10/8>  Anti-infectives (From admission, onward)    Start     Dose/Rate Route Frequency Ordered Stop   01/13/21 1000  cefTRIAXone (ROCEPHIN) 2 g in sodium chloride 0.9 % 100 mL IVPB        2 g 200 mL/hr over 30 Minutes Intravenous Every 24 hours 01/12/21 0635 01/17/21 0959   01/13/21 0400  azithromycin (ZITHROMAX) 500 mg in sodium chloride 0.9 % 250 mL IVPB        500 mg 250 mL/hr over 60 Minutes Intravenous Every 24 hours 01/12/21 0604 01/17/21 0359   01/12/21 2200  cefTRIAXone (ROCEPHIN) 2 g in sodium chloride 0.9 % 100 mL IVPB  Status:  Discontinued        2 g 200 mL/hr over 30 Minutes Intravenous Every 24 hours 01/12/21 0604 01/12/21 0635   01/12/21 1000  cefTRIAXone (ROCEPHIN) 1 g in sodium chloride 0.9 % 100 mL IVPB        1 g 200 mL/hr over 30 Minutes Intravenous  Once 01/12/21 0636 01/12/21 1253   01/12/21 0345  cefTRIAXone (ROCEPHIN) 1 g in sodium chloride 0.9 % 100 mL IVPB        1 g 200 mL/hr over 30 Minutes Intravenous  Once 01/12/21 0338 01/12/21 0459   01/12/21 0345  azithromycin (ZITHROMAX) 500 mg in sodium chloride 0.9 % 250 mL IVPB        500 mg 250 mL/hr over 60 Minutes Intravenous  Once 01/12/21 0338 01/12/21 0636      Subjective: Today, patient was seen and examined at bedside.  Continues to feel  okay.  Denies any pain, nausea, vomiting, dizziness.  Was on BiPAP overnight.  Currently on nasal cannula oxygen.  Continues to have productive sputum.   Objective: Vitals:   01/14/21 0845 01/14/21 0848  BP:  (!) 144/90  Pulse: 90 91  Resp: (!) 26 (!) 22  Temp:    SpO2: 98% 99%    Intake/Output Summary (Last 24 hours) at 01/14/2021 1113 Last data filed at 01/14/2021 1022 Gross per 24 hour  Intake 856.03 ml  Output 1885 ml  Net -1028.97 ml    Filed Weights   01/12/21 0841 01/13/21 0500 01/14/21 0500  Weight: 109.3 kg 114.5 kg 119.5 kg   Body mass index is 40.06 kg/m.   Physical Exam: GENERAL: Patient is awake and communicative, obese, on nasal cannula oxygen HENT: No scleral pallor or icterus. Pupils equally reactive to light. Oral  mucosa is moist NECK: is supple, no gross swelling noted. CHEST: Diminished breath sounds bilaterally, coarse breath sounds with occasional crackles noted. CVS: S1 and S2 heard, no murmur. Regular rate and rhythm.  ABDOMEN: Soft, non-tender, bowel sounds are present. EXTREMITIES: Bilateral lower extremity edema noted  CNS: Cranial nerves are intact. No focal motor deficits. SKIN: warm and dry without rashes.  Data Review: I have personally reviewed the following laboratory data and studies,  CBC: Recent Labs  Lab 01/12/21 0202 01/13/21 0309 01/14/21 0319  WBC 8.4 9.5 10.7*  NEUTROABS 7.0  --   --   HGB 14.4 11.6* 11.2*  HCT 46.3 36.6* 35.5*  MCV 80.8 79.4* 81.1  PLT 288 284 300    Basic Metabolic Panel: Recent Labs  Lab 01/12/21 0202 01/12/21 1429 01/13/21 0309 01/14/21 0319  NA 134* 132* 137 140  K 2.8* 3.2* 3.3* 3.1*  CL 96* 96* 100 103  CO2 25 25 25 27   GLUCOSE 169* 161* 123* 135*  BUN 25* 28* 33* 40*  CREATININE 2.29* 2.41* 2.65* 2.92*  CALCIUM 8.8* 7.8* 8.4* 8.5*  MG 1.6*  --  1.7 1.9  PHOS  --   --  3.6  --     Liver Function Tests: Recent Labs  Lab 01/12/21 0202  AST 43*  ALT 23  ALKPHOS 52  BILITOT 1.3*   PROT 7.4  ALBUMIN 3.3*    No results for input(s): LIPASE, AMYLASE in the last 168 hours. No results for input(s): AMMONIA in the last 168 hours. Cardiac Enzymes: No results for input(s): CKTOTAL, CKMB, CKMBINDEX, TROPONINI in the last 168 hours. BNP (last 3 results) Recent Labs    01/12/21 0202  BNP 853.5*     ProBNP (last 3 results) No results for input(s): PROBNP in the last 8760 hours.  CBG: Recent Labs  Lab 01/13/21 0757 01/13/21 1225 01/13/21 1638 01/13/21 2151 01/14/21 0821  GLUCAP 116* 169* 118* 101* 127*    Recent Results (from the past 240 hour(s))  Resp Panel by RT-PCR (Flu A&B, Covid) Nasopharyngeal Swab     Status: None   Collection Time: 01/12/21  2:17 AM   Specimen: Nasopharyngeal Swab; Nasopharyngeal(NP) swabs in vial transport medium  Result Value Ref Range Status   SARS Coronavirus 2 by RT PCR NEGATIVE NEGATIVE Final    Comment: (NOTE) SARS-CoV-2 target nucleic acids are NOT DETECTED.  The SARS-CoV-2 RNA is generally detectable in upper respiratory specimens during the acute phase of infection. The lowest concentration of SARS-CoV-2 viral copies this assay can detect is 138 copies/mL. A negative result does not preclude SARS-Cov-2 infection and should not be used as the sole basis for treatment or other patient management decisions. A negative result may occur with  improper specimen collection/handling, submission of specimen other than nasopharyngeal swab, presence of viral mutation(s) within the areas targeted by this assay, and inadequate number of viral copies(<138 copies/mL). A negative result must be combined with clinical observations, patient history, and epidemiological information. The expected result is Negative.  Fact Sheet for Patients:  03/14/21  Fact Sheet for Healthcare Providers:  BloggerCourse.com  This test is no t yet approved or cleared by the [employer] FDA  and  has been authorized for detection and/or diagnosis of SARS-CoV-2 by FDA under an Emergency Use Authorization (EUA). This EUA will remain  in effect (meaning this test can be used) for the duration of the COVID-19 declaration under Section 564(b)(1) of the Act, 21 U.S.C.section 360bbb-3(b)(1), unless the authorization is terminated  or revoked sooner.       Influenza A by PCR NEGATIVE NEGATIVE Final   Influenza B by PCR NEGATIVE NEGATIVE Final    Comment: (NOTE) The Xpert Xpress SARS-CoV-2/FLU/RSV plus assay is intended as an aid in the diagnosis of influenza from Nasopharyngeal swab specimens and should not be used as a sole basis for treatment. Nasal washings and aspirates are unacceptable for Xpert Xpress SARS-CoV-2/FLU/RSV testing.  Fact Sheet for Patients: BloggerCourse.com  Fact Sheet for Healthcare Providers: SeriousBroker.it  This test is not yet approved or cleared by the [place] FDA and has been authorized for detection and/or diagnosis of SARS-CoV-2 by FDA under an Emergency Use Authorization (EUA). This EUA will remain in effect (meaning this test can be used) for the duration of the COVID-19 declaration under Section 564(b)(1) of the Act, 21 U.S.C. section 360bbb-3(b)(1), unless the authorization is terminated or revoked.  Performed at [facility], [address], [place], [place] [address]   Culture, blood (Routine X 2) w Reflex to ID Panel     Status: None (Preliminary result)   Collection Time: 01/12/21  6:29 AM   Specimen: BLOOD  Result Value Ref Range Status   Specimen Description   Final    BLOOD BLOOD RIGHT HAND Performed at [facility], [address] [place], [place] [address]    Special Requests   Final    BOTTLES DRAWN AEROBIC AND ANAEROBIC Blood Culture adequate volume Performed at [facility], [address] [place],  [place] [address]    Culture   Final    NO GROWTH 2 DAYS Performed at [facility], [address] [place], [place] [address]    Report Status PENDING  Incomplete  Culture, blood (Routine X 2) w Reflex to ID Panel     Status: None (Preliminary result)   Collection Time: 01/12/21  6:29 AM   Specimen: BLOOD  Result Value Ref Range Status   Specimen Description   Final    BLOOD BLOOD LEFT FOREARM Performed at [facility], [address] [place], [place] [address]    Special Requests   Final    BOTTLES DRAWN AEROBIC AND ANAEROBIC Blood Culture adequate volume Performed at [facility], [address], [place], [place] [address]    Culture   Final    NO GROWTH 2 DAYS Performed at [facility], [address], [place], [place] [address]    Report Status PENDING  Incomplete  MRSA Next Gen by PCR, Nasal     Status: None   Collection Time: 01/12/21  8:41 AM  Result Value Ref Range Status   MRSA by PCR Next Gen NOT DETECTED NOT DETECTED Final    Comment: (NOTE) The GeneXpert MRSA Assay (FDA approved for NASAL specimens only), is one component of a comprehensive MRSA colonization surveillance program. It is not intended to diagnose MRSA infection nor to guide or monitor treatment for MRSA infections. Test performance is not FDA approved in patients less than 86 years old. Performed at [facility], [address] [place], [place] [address]       Studies: DG CHEST PORT 1 VIEW  Result Date: 01/14/2021 CLINICAL DATA:  Pneumonia, follow-up EXAM: PORTABLE CHEST 1 VIEW COMPARISON:  01/12/2021 FINDINGS: Persistent bilateral pulmonary opacities. No pleural effusion. Stable cardiomegaly. IMPRESSION: Persistent pneumonia without substantial change. Electronically Signed   By: Guadlupe Spanish M.D.   On: 01/14/2021 08:40  Joycelyn Das, MD  [facility] 01/14/2021  If 7PM-7AM, please contact night-coverage

## 2021-01-14 NOTE — Progress Notes (Signed)
Attempted to perform renal artery duplex on patient, however he ordered and ate lunch.  This is after Morrie Sheldon, RN had spoken to patient about the importance of staying NPO until this afternoon so this exam could be performed.  Informed both patient and nurse that he would need to be NPO after midnight and we will re-attempt in the morning.   01/14/2021 1:49 PM Micheal Levy RVT, RDMS

## 2021-01-14 NOTE — Progress Notes (Signed)
NAME:  Micheal Levy, MRN:  [id number], DOB:  1980/08/31, LOS: 2 ADMISSION DATE:  01/12/2021, CONSULTATION DATE:  10/8 REFERRING MD:  Dr Tyson Babinski, CHIEF COMPLAINT:  Acute respiratory failure   History of Present Illness:  40 year old man with poorly controlled severe hypertension, diabetes, complications including chronic renal failure, seizures, history of CVA.  He came to the emergency department 10/8 with progressive dyspnea, cough productive of initially white and then brownish sputum.  Found to have profound hypertension systolic 250.  Chest x-ray and then CT chest showed bilateral alveolar infiltrates, some which were more nodular and consolidated in nature.  He was diuresed and started on nitroglycerin for blood pressure control.  Also received empiric ceftriaxone and azithromycin. Evening of 10/8 he had some increase in his work to breathe, using accessory abdominal muscles, tachypnea.  Also with some associated lethargy.  He did not have desaturation on monitor ABG showed hypoxemia but no evidence of hypercapnia or acidosis.  BiPAP initiated.  PCCM asked to evaluate.  Pertinent  Medical History  CKD Diabetes HLD HTN Seizures Stroke Substance abuse   Significant Hospital Events:  10/8 admitted with SOB and cough, CT chest with bilateral focal alveolar opacities, nodular with some areas of confluence and consolidation.  10/10 No acute issues overnight, remains on Nitro drip   Interim History / Subjective:  States he feels a bit better but continues to have productive cough  Worse BIPAP overnight   Objective   Blood pressure (!) 135/93, pulse 91, temperature 98.9 F (37.2 C), temperature source Oral, resp. rate 15, height 5\' 8"  (1.727 m), weight 119.5 kg, SpO2 97 %.    FiO2 (%):  [40 %] 40 %   Intake/Output Summary (Last 24 hours) at 01/14/2021 0723 Last data filed at 01/14/2021 0600 Gross per 24 hour  Intake 2255.61 ml  Output 1485 ml  Net 770.61 ml    Filed Weights    01/12/21 0841 01/13/21 0500 01/14/21 0500  Weight: 109.3 kg 114.5 kg 119.5 kg    Examination: General: Acute ill appearing adult male lying in bed in NAD HEENT: [place]/AT, MM pink/moist, PERRL,  Neuro: Alert and oriented x3, non-focal  CV: s1s2 regular rate and rhythm, no murmur, rubs, or gallops,  PULM:  Clear to ascultation bilaterally, very faint expiratory wheeze, no increased work of breathing, on 2L [place]  GI: soft, bowel sounds active in all 4 quadrants, non-tender, non-distended, tolerating oral diet  Extremities: warm/dry, non-pitting LE edema  Skin: no rashes or lesions   Resolved Hospital Problem list     Assessment & Plan:  Acute hypoxemic respiratory failure  -In the setting of hypertensive emergency and bilateral pulmonary infiltrates, suspect acute pulmonary edema but consider also CAP, other causes infiltrates Suspected (undiagnosed) obstructive sleep apnea -Benefited from BiPAP overnight, now stable on 2 L/min. Suspect he would benefit from nocturnal CPAP given probable diagnosis of OSA. P: Continue empiric CAP coverage  Head of bed elevated 30 degrees Follow intermittent chest x-ray and ABG  Ensure adequate pulmonary hygiene  Follow cultures  Mobilize as able  Wean supplemental oxygen  Nocturnal BIPAP   Hypertensive emergency with associated diastolic CHF -ECHO 10/8 with preserved EF of 55-60% and moderate concentric LV hypertrophy and grade III diastolic dysfunction  Elevated troponin  P: Continuous telemetry  Check lipid panel  Trend BNP and HS troponin  Heart health diet with sodium restriction  Strict intake and output  Daily weight to assess volume status Daily assessment for need to diurese  Closely monitor renal function and electrolytes  Continue supplemtal oxygen to maintain oxygen saturations above 90% BIPAP as above  Ensure hemodynamic control: continue oral Hydralazine Norvasc, Clonidine, and Lasix  Change Coreg back to home Lopressor and  increase dose  Continue to wean Nitro drip  Check renal artery [place]  Acute on chronic (stage IIIa) renal failure -Creatinine on admit 01/12/21 2.29, creatinine 11/11/19 1.61 P: Follow renal function  Monitor urine output Trend Bmet Avoid nephrotoxins Ensure adequate renal perfusion   Diabetes type 2 P: Continue SSI  Home metformin on hold  Trend CBG to determine when long acting insulin needs to be resumed   History of seizures P: Continue home Keppra Seizure precautions   History of CVA P: Continue home aASA  Best Practice (right click and "Reselect all SmartList Selections" daily)   Diet/type: Regular consistency (see orders) DVT prophylaxis: prophylactic heparin  GI prophylaxis: N/A Lines: N/A Foley:  N/A Code Status:  full code Last date of multidisciplinary goals of care discussion [pending] Family: Wife updated at bedside 10/8, patient updated 10/9  Critical care time:    Performed by: Erick Murin D. Harris  Total critical care time: 40 minutes  Critical care time was exclusive of separately billable procedures and treating other patients.  Critical care was necessary to treat or prevent imminent or life-threatening deterioration.  Critical care was time spent personally by me on the following activities: development of treatment plan with patient and/or surrogate as well as nursing, discussions with consultants, evaluation of patient's response to treatment, examination of patient, obtaining history from patient or surrogate, ordering and performing treatments and interventions, ordering and review of laboratory studies, ordering and review of radiographic studies, pulse oximetry and re-evaluation of patient's condition.  Hermen Mario D. Tiburcio Pea, NP-C [facility] Personal contact information can be found on Amion  01/14/2021, 7:38 AM

## 2021-01-14 NOTE — Progress Notes (Signed)
RT went to check on pt. Pt was off of bipap and on [place]. Pt said he doesn't want to wear it and resting well at this time. RN aware.

## 2021-01-15 ENCOUNTER — Inpatient Hospital Stay (HOSPITAL_COMMUNITY): Payer: Medicaid Other

## 2021-01-15 DIAGNOSIS — I160 Hypertensive urgency: Secondary | ICD-10-CM

## 2021-01-15 LAB — LIPID PANEL
Cholesterol: 91 mg/dL (ref 0–200)
HDL: 28 mg/dL — ABNORMAL LOW (ref >40)
LDL Cholesterol: 47 mg/dL (ref 0–99)
Total CHOL/HDL Ratio: 3.3 RATIO
Triglycerides: 81 mg/dL (ref <150)
VLDL: 16 mg/dL (ref 0–40)

## 2021-01-15 LAB — BASIC METABOLIC PANEL
Anion gap: 9 (ref 5–15)
BUN: 45 mg/dL — ABNORMAL HIGH (ref 6–20)
CO2: 26 mmol/L (ref 22–32)
Calcium: 8.6 mg/dL — ABNORMAL LOW (ref 8.9–10.3)
Chloride: 99 mmol/L (ref 98–111)
Creatinine, Ser: 2.9 mg/dL — ABNORMAL HIGH (ref 0.61–1.24)
GFR, Estimated: 27 mL/min — ABNORMAL LOW (ref >60)
Glucose, Bld: 114 mg/dL — ABNORMAL HIGH (ref 70–99)
Potassium: 4.1 mmol/L (ref 3.5–5.1)
Sodium: 134 mmol/L — ABNORMAL LOW (ref 135–145)

## 2021-01-15 LAB — GLUCOSE, CAPILLARY
Glucose-Capillary: 99 mg/dL (ref 70–99)
Glucose-Capillary: 146 mg/dL — ABNORMAL HIGH (ref 70–99)
Glucose-Capillary: 119 mg/dL — ABNORMAL HIGH (ref 70–99)
Glucose-Capillary: 128 mg/dL — ABNORMAL HIGH (ref 70–99)

## 2021-01-15 LAB — URINALYSIS, ROUTINE W REFLEX MICROSCOPIC
Bacteria, UA: NONE SEEN
Bilirubin Urine: NEGATIVE
Glucose, UA: NEGATIVE mg/dL
Hgb urine dipstick: NEGATIVE
Ketones, ur: NEGATIVE mg/dL
Leukocytes,Ua: NEGATIVE
Nitrite: NEGATIVE
Protein, ur: 100 mg/dL — AB
Specific Gravity, Urine: 1.013 (ref 1.005–1.030)
pH: 5 (ref 5.0–8.0)

## 2021-01-15 LAB — CBC
HCT: 37.2 % — ABNORMAL LOW (ref 39.0–52.0)
Hemoglobin: 11.2 g/dL — ABNORMAL LOW (ref 13.0–17.0)
MCH: 24.8 pg — ABNORMAL LOW (ref 26.0–34.0)
MCHC: 30.1 g/dL (ref 30.0–36.0)
MCV: 82.5 fL (ref 80.0–100.0)
Platelets: 340 10*3/uL (ref 150–400)
RBC: 4.51 MIL/uL (ref 4.22–5.81)
RDW: 17 % — ABNORMAL HIGH (ref 11.5–15.5)
WBC: 9.4 10*3/uL (ref 4.0–10.5)
nRBC: 0 % (ref 0.0–0.2)

## 2021-01-15 LAB — MAGNESIUM: Magnesium: 2.2 mg/dL (ref 1.7–2.4)

## 2021-01-15 MED ORDER — ISOSORBIDE MONONITRATE ER 30 MG PO TB24
30.0000 mg | ORAL_TABLET | Freq: Every day | ORAL | Status: DC
  Administered 2021-01-15 – 2021-01-18 (×4): 30 mg via ORAL
  Filled 2021-01-15 (×4): qty 1

## 2021-01-15 MED ORDER — PANTOPRAZOLE SODIUM 40 MG PO TBEC
40.0000 mg | DELAYED_RELEASE_TABLET | Freq: Every day | ORAL | Status: DC
  Administered 2021-01-15 – 2021-01-19 (×5): 40 mg via ORAL
  Filled 2021-01-15 (×5): qty 1

## 2021-01-15 MED ORDER — INSULIN GLARGINE-YFGN 100 UNIT/ML ~~LOC~~ SOLN
5.0000 [IU] | Freq: Every day | SUBCUTANEOUS | Status: DC
  Administered 2021-01-15 – 2021-01-18 (×4): 5 [IU] via SUBCUTANEOUS
  Filled 2021-01-15 (×4): qty 0.05

## 2021-01-15 MED ORDER — METOPROLOL TARTRATE 25 MG PO TABS
50.0000 mg | ORAL_TABLET | Freq: Two times a day (BID) | ORAL | Status: DC
  Administered 2021-01-15: 50 mg via ORAL
  Filled 2021-01-15: qty 2

## 2021-01-15 MED ORDER — INSULIN GLARGINE 100 UNIT/ML SOLOSTAR PEN: 30.0000 [IU] | PEN_INJECTOR | Freq: Every day | SUBCUTANEOUS | Status: DC

## 2021-01-15 MED ORDER — FUROSEMIDE 10 MG/ML IJ SOLN
40.0000 mg | Freq: Two times a day (BID) | INTRAMUSCULAR | Status: DC
  Administered 2021-01-15 – 2021-01-19 (×8): 40 mg via INTRAVENOUS
  Filled 2021-01-15 (×8): qty 4

## 2021-01-15 MED ORDER — CARVEDILOL 12.5 MG PO TABS
12.5000 mg | ORAL_TABLET | Freq: Two times a day (BID) | ORAL | Status: DC
  Administered 2021-01-15 – 2021-01-16 (×3): 12.5 mg via ORAL
  Filled 2021-01-15 (×3): qty 1

## 2021-01-15 NOTE — Consult Note (Signed)
Nephrology Consult   Assessment/Recommendations:   AKI on CKD3: -last known Cr was 1.6 in 11/2019. Cr currently stable today 2.9, remains non-oliguric. Unclear at this junction if he has had some progression of disease. Underlying CKD likely related to HTN and DM. AKI likely related to HTN emergency and infection -Renal artery duplex today-report pending -check UA w/ microscopy today -currently on lasix 40mg  IV BID. If Cr worsens, would recommend holding this. -Continue to monitor daily Cr, Dose meds for GFR<15 -Monitor Daily I/Os, Daily weight  -Maintain MAP>65 for optimal renal perfusion.  -Avoid further nephrotoxins including NSAIDS, Morphine.  Unless absolutely necessary, avoid CT with contrast and/or MRI with gadolinium.     AHRF secondary to multifocal pneumonia, possibly CHF related as well -despite lasix, cxr is unchanged. Abx per primary service, currently on azithromycin and rocephin -recommend holding lasix if Cr worsens. Volume status relatively acceptable. CXR unchanged despite diuretics.  Hypertension emergency: -Currently on amlodipine, Coreg, clonidine patch, Lasix IV, hydralazine, Imdur. BP is better as compared to admit, coreg just added 10/11, can titrate coreg or clonidine patch as needed -renal artery duplex 10/11  Anemia due to chronic disease: -Transfuse for Hgb<7 g/dL. Hgb currently acceptable/stable  Diabetes Mellitus Type 2 -per primary  12/11 [facility] 01/15/2021 9:58 AM   _____________________________________________________________________________________   History of Present Illness: Micheal Levy is a/an 40 y.o. male with a past medical history of CKD 3, hypertension, DM 2, seizure disorder, history of CVA, history of substance abuse who presents to [facility] with shortness of breath.  Was found to be severely hypertensive on presentation in the ER.  His chest x-ray did show diffuse nodular parenchymal opacities.  He did require a  nitro drip briefly.  Has been receiving Lasix 40 mg twice daily given concerns for acute on chronic diastolic CHF exacerbation.  COVID and flu negative. He reports that his home medications had been confusing, every time he went to go pick up his medications, there would be a medication that would be discontinued. He reports feeling a lot better as compared to admission. Still coughing a lot.   He does report a positive family history of kidney disease/ESRD in his father who has been since deceased, was on dialysis. His kidney disease was thought to be secondary to DM.  Patient denies any other complaints.  Medications:  Current Facility-Administered Medications  Medication Dose Route Frequency Provider Last Rate Last Admin   0.9 %  sodium chloride infusion  250 mL Intravenous PRN Opyd, [facility], MD   Stopped at 01/15/21 0537   acetaminophen (TYLENOL) tablet 650 mg  650 mg Oral Q4H PRN Opyd, 03/17/21, MD   650 mg at 01/13/21 1230   albuterol (PROVENTIL) (2.5 MG/3ML) 0.083% nebulizer solution 2.5 mg  2.5 mg Nebulization Q4H PRN Pokhrel, Laxman, MD   2.5 mg at 01/12/21 1346   amLODipine (NORVASC) tablet 10 mg  10 mg Oral Daily Pokhrel, Laxman, MD   10 mg at 01/15/21 0932   aspirin EC tablet 81 mg  81 mg Oral Daily Opyd, 03/17/21, MD   81 mg at 01/15/21 0932   azithromycin (ZITHROMAX) tablet 500 mg  500 mg Oral Daily 03/17/21 T, RPH   500 mg at 01/15/21 0932   cefTRIAXone (ROCEPHIN) 2 g in sodium chloride 0.9 % 100 mL IVPB  2 g Intravenous Q24H Opyd, 03/17/21, MD 200 mL/hr at 01/15/21 0945 2 g at 01/15/21 0945   chlorhexidine (PERIDEX) 0.12 % solution  15 mL  15 mL Mouth Rinse BID Pokhrel, Laxman, MD   15 mL at 01/15/21 0932   Chlorhexidine Gluconate Cloth 2 % PADS 6 each  6 each Topical Daily Pokhrel, Laxman, MD   6 each at 01/14/21 1610   cloNIDine (CATAPRES - Dosed in mg/24 hr) patch 0.1 mg  0.1 mg Transdermal Weekly Pokhrel, Laxman, MD   0.1 mg at 01/12/21 1138   furosemide (LASIX)  injection 40 mg  40 mg Intravenous Q12H Opyd, Lavone Neri, MD   40 mg at 01/15/21 0521   guaiFENesin-dextromethorphan (ROBITUSSIN DM) 100-10 MG/5ML syrup 5 mL  5 mL Oral Q4H PRN Pokhrel, Laxman, MD   5 mL at 01/15/21 0947   heparin injection 5,000 Units  5,000 Units Subcutaneous Q8H Opyd, Lavone Neri, MD   5,000 Units at 01/15/21 0522   hydrALAZINE (APRESOLINE) injection 10 mg  10 mg Intravenous Q4H PRN Karl Ito, MD   10 mg at 01/13/21 0141   hydrALAZINE (APRESOLINE) tablet 100 mg  100 mg Oral TID Pokhrel, Laxman, MD   100 mg at 01/15/21 0932   insulin aspart (novoLOG) injection 0-5 Units  0-5 Units Subcutaneous QHS Opyd, Lavone Neri, MD       insulin aspart (novoLOG) injection 0-9 Units  0-9 Units Subcutaneous TID WC Opyd, Lavone Neri, MD   2 Units at 01/14/21 1620   labetalol (NORMODYNE) injection 10 mg  10 mg Intravenous Q2H PRN Pokhrel, Laxman, MD   10 mg at 01/14/21 0011   levETIRAcetam (KEPPRA) tablet 1,000 mg  1,000 mg Oral BID Pokhrel, Laxman, MD   1,000 mg at 01/15/21 0932   LORazepam (ATIVAN) tablet 1 mg  1 mg Oral Q6H PRN Pokhrel, Laxman, MD   1 mg at 01/12/21 1338   metoprolol tartrate (LOPRESSOR) tablet 50 mg  50 mg Oral BID Janeann Forehand D, NP   50 mg at 01/15/21 0932   ondansetron (ZOFRAN) injection 4 mg  4 mg Intravenous Q6H PRN Opyd, Lavone Neri, MD   4 mg at 01/14/21 1103     ALLERGIES Patient has no known allergies.  MEDICAL HISTORY Past Medical History:  Diagnosis Date   Chronic kidney disease    Diabetes mellitus without complication (HCC)    Elevated serum cholesterol 04/2019   Elevated serum creatinine 04/2019   History of seizures    Hyperglycemia    Hypertension    Hypertensive urgency 04/2019   Hypokalemia    Seizures (HCC) 03/2017   x 2    Stroke (HCC) 03/2017   Substance abuse (HCC)    Vitamin D deficiency 04/2019     SOCIAL HISTORY Social History   Socioeconomic History   Marital status: Single    Spouse name: Not on file   Number of children:  Not on file   Years of education: Not on file   Highest education level: Not on file  Occupational History   Not on file  Tobacco Use   Smoking status: Former    Types: Cigarettes    Quit date: 04/25/1999    Years since quitting: 21.7   Smokeless tobacco: Never   Tobacco comments:    2001  Vaping Use   Vaping Use: Never used  Substance and Sexual Activity   Alcohol use: Yes    Comment: occ, none since Dec 2018   Drug use: Yes    Types: Cocaine, Marijuana    Comment: weed-daily. cocaine- once or twice a month, 05/25/17 avg 1/2 gm daily, no cocaine since  Dec 2018    Sexual activity: Yes  Other Topics Concern   Not on file  Social History Narrative   ** Merged History Encounter **   05/25/17 Lives with girlfriend   05/25/17 currently out of work   Education- GED   Children- 3    no caffeine use   Social Determinants of Corporate investment banker Strain: Not on file  Food Insecurity: Not on file  Transportation Needs: Not on file  Physical Activity: Not on file  Stress: Not on file  Social Connections: Not on file  Intimate Partner Violence: Not on file     FAMILY HISTORY Family History  Problem Relation Age of Onset   Diabetes Father    Heart attack Father    Diabetes Sister    Hypertension Brother     Review of Systems: 12 systems reviewed Otherwise as per HPI, all other systems reviewed and negative  Physical Exam: Vitals:   01/15/21 0726 01/15/21 0730  BP: 115/85   Pulse:    Resp: 19   Temp:  99 F (37.2 C)  SpO2:     No intake/output data recorded.  Intake/Output Summary (Last 24 hours) at 01/15/2021 0958 Last data filed at 01/15/2021 0700 Gross per 24 hour  Intake 515.19 ml  Output 1350 ml  Net -834.81 ml   General: well-appearing, no acute distress HEENT: anicteric sclera, oropharynx clear without lesions CV: regular rate, normal rhythm, no murmurs, no gallops, no rubs Lungs: poor air exchange bilaterally with some end expiratory  wheezing Abd: obese, soft, non-tender, non-distended Skin: no visible lesions or rashes Ext: 1+ pitting edema b/l LE's Neuro: normal speech, no gross focal deficits   Test Results Reviewed Lab Results  Component Value Date   NA 134 (L) 01/15/2021   K 4.1 01/15/2021   CL 99 01/15/2021   CO2 26 01/15/2021   BUN 45 (H) 01/15/2021   CREATININE 2.90 (H) 01/15/2021   CALCIUM 8.6 (L) 01/15/2021   ALBUMIN 3.3 (L) 01/12/2021   PHOS 3.6 01/13/2021     I have reviewed all relevant outside healthcare records related to the patient's kidney injury.

## 2021-01-15 NOTE — Progress Notes (Signed)
Renal artery duplex has been completed. Preliminary results can be found in CV Proc through chart review.   01/15/21 10:31 AM Olen Cordial RVT

## 2021-01-15 NOTE — Progress Notes (Signed)
PROGRESS NOTE  Micheal Levy LZJ:[id number] DOB: May 14, 1980 DOA: 01/12/2021 PCP: Barbette Merino, NP   LOS: 3 days   Brief narrative: Micheal Levy is a 40 y.o. male with medical history significant for CKD, diabetes mellitus, hypertension history of CVA, seizure disorder history of substance abuse in the past in remission presented to hospital with worsening shortness of breath with cough productive in nature.  In the ED, patient was noted to be hypoxic with a pulse ox of 80% on room air, tachypneic tachycardic in the 110s and severely hypertensive.  EKG showed sinus tachycardia.  Chest x-ray showed diffuse nodular parenchymal opacities.  Chest CT demonstrated nodular and confluent infiltrates throughout both lungs.  Initial BMP showed hypokalemia with potassium of 2.8, creatinine was elevated at 2.2 and mildly elevated AST.  CBC was unremarkable.  Patient did have mild elevated troponins.  BNP was elevated at 854.  Patient was given 40 mg of Lasix, IV Rocephin and Zithromax oral potassium Nitropaste and was admitted to the hospital.  He continued to have elevated blood pressure so nitroglycerin drip was initiated.  During hospitalization, patient had acute respiratory distress and was put on BiPAP.  He was transferred to stepdown unit with nitro drip and BiPAP.  Pulmonary critical care was consulted as well.  Assessment/Plan:  Principal Problem:   Acute respiratory failure with hypoxia (HCC) Active Problems:   Cerebral embolism with cerebral infarction   History of substance abuse (HCC)   Hypertensive urgency   Uncontrolled type 2 diabetes mellitus with hyperglycemia (HCC)   Hypokalemia   Seizures (HCC)   Chronic kidney disease, stage 3a (HCC)   Acute on chronic diastolic CHF (congestive heart failure) (HCC)   Elevated troponin  Acute hypoxic respiratory failure secondary to multifocal pneumonia and heart failure, possible underlying obstructive sleep apnea. Patient required BiPAP  for respiratory failure, now on nasal cannula oxygen.  Continue Rocephin and Zithromax to complete 5-day course..  Strep pneumo and legionella antigen negative.  HIV nonreactive.  MRSA was negative.  COVID and influenza was negative.  Negative urine drug screen.  Chest x-ray showed persistent pneumonia without substantial change on repeat from 01/14/2021.[name]   Acute on chronic diastolic CHF likely exacerbated by hypertensive emergency Currently on multiple oral medication.  Was on nitro drip but blood pressure still elevated.  Will change metoprolol to Coreg 12.5 twice daily.  2D echocardiogram with preserved LV function.  Patient was not taking medications at home.  Currently on hydralazine, amlodipine,  clonidine patch and added Coreg 12.5 twice daily.  Hypertensive emergency.  Received nitro drip.  Off nitro drip at the time but accelerated BP.  On hydralazine amlodipine clonidine patch.  Added Coreg 12.5 twice daily starting today.  Was on metoprolol 50 mg twice daily.  We will get nephrology assistance.  Renal artery duplex was ordered by the admitting provider.  We will follow.  Will resume Imdur from home.  Hypokalemia. Improved after aggressive replacement.  We will continue to monitor.  On IV diuretics.   Possible AKI on CKD IIIa   Serum creatinine was 2.2 on admission.  Was 1.6 in August 2021.  We will continue to monitor.  Serum creatinine is 2.9 today.  On IV diuretics.  Renal duplex was ordered.  We will get nephrology evaluation due to hypertensive emergency, CKD and no nephrology evaluation in the past.  Communicated with nephrology for consultation   History of seizures  No reported seizures in 1 year.  Continue Keppra from home.  Type II DM  Latest hemoglobin A1c was 6.5% in August 2021.  Hemoglobin A1c from 01/12/2021 at 7.1.  Continue sliding-scale insulin, diabetic diet, Accu-Cheks.  Latest blood glucose level of 127.  Patient is on glipizide and Lantus at home.  Will resume Lantus  today.   History of CVA  Continue statins and aspirin.   Elevated troponin  - HS troponin 135 then 175 in ED, similar to priors.  Likely demand ischemia secondary to accelerated hypertension, respiratory failure pneumonia and congestive heart failure.  2D echocardiogram from 01/12/2021 showed LV ejection fraction of 55 to 60% with no regional wall motion abnormality.  Grade 3 diastolic dysfunction was noted.  DVT prophylaxis: heparin injection 5,000 Units Start: 01/12/21 0615   Code Status: Full code  Family Communication:  None today.    Status is: Inpatient  Remains inpatient appropriate because:Unsafe d/c plan, IV treatments appropriate due to intensity of illness or inability to take PO, and Inpatient level of care appropriate due to severity of illness  Dispo: The patient is from: Home              Anticipated d/c is to: Home              Patient currently is not medically stable to d/c.   Difficult to place patient No  Consultants: PCCM Nephrology  Procedures: None  Anti-infectives:  Rocephin and Zithromax IV 10/8>  Anti-infectives (From admission, onward)    Start     Dose/Rate Route Frequency Ordered Stop   01/15/21 1000  azithromycin (ZITHROMAX) tablet 500 mg        500 mg Oral Daily 01/14/21 1343 01/17/21 0959   01/13/21 1000  cefTRIAXone (ROCEPHIN) 2 g in sodium chloride 0.9 % 100 mL IVPB        2 g 200 mL/hr over 30 Minutes Intravenous Every 24 hours 01/12/21 0635 01/17/21 0959   01/13/21 0400  azithromycin (ZITHROMAX) 500 mg in sodium chloride 0.9 % 250 mL IVPB  Status:  Discontinued        500 mg 250 mL/hr over 60 Minutes Intravenous Every 24 hours 01/12/21 0604 01/14/21 1343   01/12/21 2200  cefTRIAXone (ROCEPHIN) 2 g in sodium chloride 0.9 % 100 mL IVPB  Status:  Discontinued        2 g 200 mL/hr over 30 Minutes Intravenous Every 24 hours 01/12/21 0604 01/12/21 0635   01/12/21 1000  cefTRIAXone (ROCEPHIN) 1 g in sodium chloride 0.9 % 100 mL IVPB         1 g 200 mL/hr over 30 Minutes Intravenous  Once 01/12/21 0636 01/12/21 1253   01/12/21 0345  cefTRIAXone (ROCEPHIN) 1 g in sodium chloride 0.9 % 100 mL IVPB        1 g 200 mL/hr over 30 Minutes Intravenous  Once 01/12/21 0338 01/12/21 0459   01/12/21 0345  azithromycin (ZITHROMAX) 500 mg in sodium chloride 0.9 % 250 mL IVPB        500 mg 250 mL/hr over 60 Minutes Intravenous  Once 01/12/21 0338 01/12/21 0636      Subjective: Today, patient was seen and examined at bedside.  Blood pressure was noted to be high.  Patient states that his breathing has improved.  Has been diuresing some.  Complains of mild cough with sputum.  Denies chest pain dizziness or lightheadedness.  Objective: Vitals:   01/15/21 0726 01/15/21 0730  BP: 115/85   Pulse:    Resp: 19   Temp:  99 F (  37.2 C)  SpO2:      Intake/Output Summary (Last 24 hours) at 01/15/2021 1013 Last data filed at 01/15/2021 0700 Gross per 24 hour  Intake 515.19 ml  Output 1350 ml  Net -834.81 ml    Filed Weights   01/13/21 0500 01/14/21 0500 01/15/21 0500  Weight: 114.5 kg 119.5 kg 111.6 kg   Body mass index is 37.41 kg/m.   Physical Exam: GENERAL: Patient is awake and communicative, obese, on nasal cannula oxygen, obese HENT: No scleral pallor or icterus. Pupils equally reactive to light. Oral mucosa is moist NECK: is supple, no gross swelling noted. CHEST: Diminished breath sounds bilaterally, coarse breath sounds  CVS: S1 and S2 heard, no murmur. Regular rate and rhythm.  ABDOMEN: Soft, non-tender, bowel sounds are present. EXTREMITIES: Bilateral lower extremity edema, pitting edema noted  CNS: Cranial nerves are intact. No focal motor deficits. SKIN: warm and dry without rashes.  Data Review: I have personally reviewed the following laboratory data and studies,  CBC: Recent Labs  Lab 01/12/21 0202 01/13/21 0309 01/14/21 0319 01/15/21 0255  WBC 8.4 9.5 10.7* 9.4  NEUTROABS 7.0  --   --   --   HGB 14.4  11.6* 11.2* 11.2*  HCT 46.3 36.6* 35.5* 37.2*  MCV 80.8 79.4* 81.1 82.5  PLT 288 284 300 340    Basic Metabolic Panel: Recent Labs  Lab 01/12/21 0202 01/12/21 1429 01/13/21 0309 01/14/21 0319 01/15/21 0255  NA 134* 132* 137 140 134*  K 2.8* 3.2* 3.3* 3.1* 4.1  CL 96* 96* 100 103 99  CO2 25 25 25 27 26   GLUCOSE 169* 161* 123* 135* 114*  BUN 25* 28* 33* 40* 45*  CREATININE 2.29* 2.41* 2.65* 2.92* 2.90*  CALCIUM 8.8* 7.8* 8.4* 8.5* 8.6*  MG 1.6*  --  1.7 1.9 2.2  PHOS  --   --  3.6  --   --     Liver Function Tests: Recent Labs  Lab 01/12/21 0202  AST 43*  ALT 23  ALKPHOS 52  BILITOT 1.3*  PROT 7.4  ALBUMIN 3.3*    No results for input(s): LIPASE, AMYLASE in the last 168 hours. No results for input(s): AMMONIA in the last 168 hours. Cardiac Enzymes: No results for input(s): CKTOTAL, CKMB, CKMBINDEX, TROPONINI in the last 168 hours. BNP (last 3 results) Recent Labs    01/12/21 0202  BNP 853.5*     ProBNP (last 3 results) No results for input(s): PROBNP in the last 8760 hours.  CBG: Recent Labs  Lab 01/14/21 0821 01/14/21 1213 01/14/21 1604 01/14/21 2140 01/15/21 0754  GLUCAP 127* 126* 154* 103* 99    Recent Results (from the past 240 hour(s))  Resp Panel by RT-PCR (Flu A&B, Covid) Nasopharyngeal Swab     Status: None   Collection Time: 01/12/21  2:17 AM   Specimen: Nasopharyngeal Swab; Nasopharyngeal(NP) swabs in vial transport medium  Result Value Ref Range Status   SARS Coronavirus 2 by RT PCR NEGATIVE NEGATIVE Final    Comment: (NOTE) SARS-CoV-2 target nucleic acids are NOT DETECTED.  The SARS-CoV-2 RNA is generally detectable in upper respiratory specimens during the acute phase of infection. The lowest concentration of SARS-CoV-2 viral copies this assay can detect is 138 copies/mL. A negative result does not preclude SARS-Cov-2 infection and should not be used as the sole basis for treatment or other patient management decisions. A  negative result may occur with  improper specimen collection/handling, submission of specimen other than nasopharyngeal swab, presence  of viral mutation(s) within the areas targeted by this assay, and inadequate number of viral copies(<138 copies/mL). A negative result must be combined with clinical observations, patient history, and epidemiological information. The expected result is Negative.  Fact Sheet for Patients:  BloggerCourse.com  Fact Sheet for Healthcare Providers:  SeriousBroker.it  This test is no t yet approved or cleared by the [place] FDA and  has been authorized for detection and/or diagnosis of SARS-CoV-2 by FDA under an Emergency Use Authorization (EUA). This EUA will remain  in effect (meaning this test can be used) for the duration of the COVID-19 declaration under Section 564(b)(1) of the Act, 21 U.S.C.section 360bbb-3(b)(1), unless the authorization is terminated  or revoked sooner.       Influenza A by PCR NEGATIVE NEGATIVE Final   Influenza B by PCR NEGATIVE NEGATIVE Final    Comment: (NOTE) The Xpert Xpress SARS-CoV-2/FLU/RSV plus assay is intended as an aid in the diagnosis of influenza from Nasopharyngeal swab specimens and should not be used as a sole basis for treatment. Nasal washings and aspirates are unacceptable for Xpert Xpress SARS-CoV-2/FLU/RSV testing.  Fact Sheet for Patients: BloggerCourse.com  Fact Sheet for Healthcare Providers: SeriousBroker.it  This test is not yet approved or cleared by the [place] FDA and has been authorized for detection and/or diagnosis of SARS-CoV-2 by FDA under an Emergency Use Authorization (EUA). This EUA will remain in effect (meaning this test can be used) for the duration of the COVID-19 declaration under Section 564(b)(1) of the Act, 21 U.S.C. section 360bbb-3(b)(1), unless the authorization  is terminated or revoked.  Performed at [facility], [address] [place], [place] [address]   Culture, blood (Routine X 2) w Reflex to ID Panel     Status: None (Preliminary result)   Collection Time: 01/12/21  6:29 AM   Specimen: BLOOD  Result Value Ref Range Status   Specimen Description   Final    BLOOD BLOOD RIGHT HAND Performed at [facility], [address], [place], [place] [address]    Special Requests   Final    BOTTLES DRAWN AEROBIC AND ANAEROBIC Blood Culture adequate volume Performed at [facility], [address] [place], [place] [address]    Culture   Final    NO GROWTH 3 DAYS Performed at [facility], [address] [place], [place] [address]    Report Status PENDING  Incomplete  Culture, blood (Routine X 2) w Reflex to ID Panel     Status: None (Preliminary result)   Collection Time: 01/12/21  6:29 AM   Specimen: BLOOD  Result Value Ref Range Status   Specimen Description   Final    BLOOD BLOOD LEFT FOREARM Performed at [facility], [address], [place], [place] [address]    Special Requests   Final    BOTTLES DRAWN AEROBIC AND ANAEROBIC Blood Culture adequate volume Performed at [facility], [address], [place], [place] [address]    Culture   Final    NO GROWTH 3 DAYS Performed at [facility], [address], [place], [place] [address]    Report Status PENDING  Incomplete  MRSA Next Gen by PCR, Nasal     Status: None   Collection Time: 01/12/21  8:41 AM  Result Value Ref Range Status   MRSA by PCR Next Gen NOT DETECTED NOT DETECTED Final    Comment: (NOTE) The GeneXpert MRSA  Assay (FDA approved for NASAL specimens only), is one component of a comprehensive MRSA colonization surveillance program. It is not intended to diagnose MRSA infection nor to guide or monitor treatment for MRSA infections. Test performance is not FDA  approved in patients less than 58 years old. Performed at [facility], [address] [place], [place] [address]       Studies: DG CHEST PORT 1 VIEW  Result Date: 01/14/2021 CLINICAL DATA:  Pneumonia, follow-up EXAM: PORTABLE CHEST 1 VIEW COMPARISON:  01/12/2021 FINDINGS: Persistent bilateral pulmonary opacities. No pleural effusion. Stable cardiomegaly. IMPRESSION: Persistent pneumonia without substantial change. Electronically Signed   By: Guadlupe Spanish M.D.   On: 01/14/2021 08:40      Joycelyn Das, MD  [facility] 01/15/2021  If 7PM-7AM, please contact night-coverage

## 2021-01-15 NOTE — Progress Notes (Signed)
PCCM Progress Note  No further critical care needs identified.   PCCM will sign off. Thank you for the opportunity to participate in this patient's care. Please contact if we can be of further assistance.  Vastie Douty D. Tiburcio Pea, NP-C [facility] Personal contact information can be found on Amion  01/15/2021, 11:23 AM

## 2021-01-16 LAB — BASIC METABOLIC PANEL
Anion gap: 10 (ref 5–15)
BUN: 43 mg/dL — ABNORMAL HIGH (ref 6–20)
CO2: 25 mmol/L (ref 22–32)
Calcium: 8.6 mg/dL — ABNORMAL LOW (ref 8.9–10.3)
Chloride: 100 mmol/L (ref 98–111)
Creatinine, Ser: 2.87 mg/dL — ABNORMAL HIGH (ref 0.61–1.24)
GFR, Estimated: 27 mL/min — ABNORMAL LOW (ref >60)
Glucose, Bld: 119 mg/dL — ABNORMAL HIGH (ref 70–99)
Potassium: 4 mmol/L (ref 3.5–5.1)
Sodium: 135 mmol/L (ref 135–145)

## 2021-01-16 LAB — GLUCOSE, CAPILLARY
Glucose-Capillary: 135 mg/dL — ABNORMAL HIGH (ref 70–99)
Glucose-Capillary: 126 mg/dL — ABNORMAL HIGH (ref 70–99)
Glucose-Capillary: 108 mg/dL — ABNORMAL HIGH (ref 70–99)
Glucose-Capillary: 138 mg/dL — ABNORMAL HIGH (ref 70–99)

## 2021-01-16 LAB — CBC
HCT: 37.2 % — ABNORMAL LOW (ref 39.0–52.0)
Hemoglobin: 11.4 g/dL — ABNORMAL LOW (ref 13.0–17.0)
MCH: 25.4 pg — ABNORMAL LOW (ref 26.0–34.0)
MCHC: 30.6 g/dL (ref 30.0–36.0)
MCV: 83 fL (ref 80.0–100.0)
Platelets: 438 10*3/uL — ABNORMAL HIGH (ref 150–400)
RBC: 4.48 MIL/uL (ref 4.22–5.81)
RDW: 17.2 % — ABNORMAL HIGH (ref 11.5–15.5)
WBC: 7.3 10*3/uL (ref 4.0–10.5)
nRBC: 0 % (ref 0.0–0.2)

## 2021-01-16 LAB — MAGNESIUM: Magnesium: 2 mg/dL (ref 1.7–2.4)

## 2021-01-16 LAB — PROTEIN / CREATININE RATIO, URINE
Creatinine, Urine: 67.82 mg/dL
Protein Creatinine Ratio: 1.39 mg/mg{Cre} — ABNORMAL HIGH (ref 0.00–0.15)
Total Protein, Urine: 94 mg/dL

## 2021-01-16 NOTE — Progress Notes (Signed)
PROGRESS NOTE  Micheal Levy CWU:[id number] DOB: May 22, 1980 DOA: 01/12/2021 PCP: Barbette Merino, NP   LOS: 4 days   Brief narrative: Micheal Levy is a 40 y.o. male with medical history significant for CKD, diabetes mellitus, hypertension history of CVA, seizure disorder history of substance abuse in the past in remission presented to hospital with worsening shortness of breath with cough productive in nature.  In the ED, patient was noted to be hypoxic with a pulse ox of 80% on room air, tachypneic tachycardic in the 110s and severely hypertensive.  EKG showed sinus tachycardia.  Chest x-ray showed diffuse nodular parenchymal opacities.  Chest CT demonstrated nodular and confluent infiltrates throughout both lungs.  Initial BMP showed hypokalemia with potassium of 2.8, creatinine was elevated at 2.2 and mildly elevated AST.  CBC was unremarkable.  Patient did have mild elevated troponins.  BNP was elevated at 854.  Patient was given 40 mg of Lasix, IV Rocephin and Zithromax oral potassium Nitropaste and was admitted to the hospital.  He continued to have elevated blood pressure so nitroglycerin drip was initiated.  During hospitalization, patient had acute respiratory distress and was put on BiPAP.  He was transferred to stepdown unit with nitro drip and BiPAP.  Pulmonary critical care was consulted as well.  Subsequently he was put on nitro drip as well for hypertensive emergency.  At this time he has been weaned down to nasal cannula oxygen and multiple oral antihypertensives has been initiated.  Patient is still on supplemental oxygen and has not ambulated.  Assessment/Plan:  Principal Problem:   Acute respiratory failure with hypoxia (HCC) Active Problems:   Cerebral embolism with cerebral infarction   History of substance abuse (HCC)   Hypertensive urgency   Uncontrolled type 2 diabetes mellitus with hyperglycemia (HCC)   Hypokalemia   Seizures (HCC)   Chronic kidney disease, stage  3a (HCC)   Acute on chronic diastolic CHF (congestive heart failure) (HCC)   Elevated troponin  Acute hypoxic respiratory failure secondary to multifocal pneumonia and heart failure, possible underlying obstructive sleep apnea. Patient required BiPAP for respiratory failure, now on nasal cannula oxygen.  Required BiPAP during the nighttime.  Continue Rocephin and Zithromax to complete course..  Strep pneumo and legionella antigen negative.  HIV nonreactive.  MRSA was negative.  COVID and influenza was negative.  Negative urine drug screen.  Chest x-ray showed persistent pneumonia without substantial change on repeat from 01/14/2021.[name]   Acute on chronic diastolic CHF likely exacerbated by hypertensive emergency Multiple antihypertensives.  On IV diuretics as well.  Still on supplemental oxygen, continue to wean as able.  We will need to ambulate the patient today.  Hypertensive emergency.  Initially received nitro drip.  Off nitro drip at the time and has been initiated on hydralazine amlodipine clonidine patch.  Started Coreg 12.5 twice daily starting 01/15/2021.   No renal artery stenosis noted on the ultrasound.  Can titrate Coreg or clonidine patch as needed for blood pressure.  Hypokalemia. Improved after replacement.  Continue to monitor.   Possible AKI on CKD IIIa   Serum creatinine was 2.2 on admission.  Creatinine was 1.6 in August 2021.  Serum creatinine is 2.8 today.  On IV diuretics.  Renal duplex was negative for hemodynamically significant stenosis.  Nephrology has been consulted for follow-up.    History of seizures  No reported seizures in 1 year.  Continue Keppra from home.   Type II DM  Latest hemoglobin A1c was 6.5% in  August 2021.  Hemoglobin A1c from 01/12/2021 at 7.1.  On sliding scale insulin.   Patient is on glipizide and Lantus at home.  Lantus dose is uncertain at this time so patient was started on 5 units at nighttime.   History of CVA  Continue statins and  aspirin.   Elevated troponin  Likely demand ischemia.  2D echocardiogram with preserved LV function.  Patient had accelerated hypertension, respiratory failure pneumonia on presentation.  DVT prophylaxis: heparin injection 5,000 Units Start: 01/12/21 0615  Code Status: Full code  Family Communication:  None today.    Status is: Inpatient  Remains inpatient appropriate because:Unsafe d/c plan, IV treatments appropriate due to intensity of illness or inability to take PO, and Inpatient level of care appropriate due to severity of illness  Dispo: The patient is from: Home              Anticipated d/c is to: Home likely by tomorrow if he continues to improve, wean oxygen.  Follow nephrology recommendation.              Patient currently is not medically stable to d/c.   Difficult to place patient No  Consultants: PCCM Nephrology  Procedures: None  Anti-infectives:  Rocephin and Zithromax IV 10/8>  Subjective: Today, patient was seen and examined at bedside.  Patient states that he continues to feel better with breathing.  Still on supplemental oxygen.  Denies any chest pain, nausea vomiting.  Has not ambulated.  Objective: Vitals:   01/16/21 1100 01/16/21 1200  BP: (!) 177/101 (!) 150/90  Pulse: 80 66  Resp: 18 18  Temp:  97.7 F (36.5 C)  SpO2: 98% 100%    Intake/Output Summary (Last 24 hours) at 01/16/2021 1310 Last data filed at 01/16/2021 0530 Gross per 24 hour  Intake 144.4 ml  Output 1175 ml  Net -1030.6 ml    Filed Weights   01/14/21 0500 01/15/21 0500 01/16/21 0527  Weight: 119.5 kg 111.6 kg 113.2 kg   Body mass index is 37.95 kg/m.   Physical Exam: General: Obese built, not in obvious distress, on nasal cannula oxygen HENT:   No scleral pallor or icterus noted. Oral mucosa is moist.  Chest:  .  Diminished breath sounds bilaterally.  Coarse breath sounds noted. CVS: S1 &S2 heard. No murmur.  Regular rate and rhythm. Abdomen: Soft, nontender,  nondistended.  Bowel sounds are heard.   Extremities: No cyanosis, clubbing bilateral lower extremity edema-trace edema, peripheral pulses are palpable. Psych: Alert, awake and oriented, normal mood CNS:  No cranial nerve deficits.  Power equal in all extremities.   Skin: Warm and dry.  No rashes noted.  Data Review: I have personally reviewed the following laboratory data and studies,  CBC: Recent Labs  Lab 01/12/21 0202 01/13/21 0309 01/14/21 0319 01/15/21 0255 01/16/21 0259  WBC 8.4 9.5 10.7* 9.4 7.3  NEUTROABS 7.0  --   --   --   --   HGB 14.4 11.6* 11.2* 11.2* 11.4*  HCT 46.3 36.6* 35.5* 37.2* 37.2*  MCV 80.8 79.4* 81.1 82.5 83.0  PLT 288 284 300 340 438*    Basic Metabolic Panel: Recent Labs  Lab 01/12/21 0202 01/12/21 1429 01/13/21 0309 01/14/21 0319 01/15/21 0255 01/16/21 0259  NA 134* 132* 137 140 134* 135  K 2.8* 3.2* 3.3* 3.1* 4.1 4.0  CL 96* 96* 100 103 99 100  CO2 25 25 25 27 26 25   GLUCOSE 169* 161* 123* 135* 114* 119*  BUN  25* 28* 33* 40* 45* 43*  CREATININE 2.29* 2.41* 2.65* 2.92* 2.90* 2.87*  CALCIUM 8.8* 7.8* 8.4* 8.5* 8.6* 8.6*  MG 1.6*  --  1.7 1.9 2.2 2.0  PHOS  --   --  3.6  --   --   --     Liver Function Tests: Recent Labs  Lab 01/12/21 0202  AST 43*  ALT 23  ALKPHOS 52  BILITOT 1.3*  PROT 7.4  ALBUMIN 3.3*    No results for input(s): LIPASE, AMYLASE in the last 168 hours. No results for input(s): AMMONIA in the last 168 hours. Cardiac Enzymes: No results for input(s): CKTOTAL, CKMB, CKMBINDEX, TROPONINI in the last 168 hours. BNP (last 3 results) Recent Labs    01/12/21 0202  BNP 853.5*     ProBNP (last 3 results) No results for input(s): PROBNP in the last 8760 hours.  CBG: Recent Labs  Lab 01/15/21 1205 01/15/21 1649 01/15/21 2106 01/16/21 0723 01/16/21 1128  GLUCAP 146* 119* 128* 135* 126*    Recent Results (from the past 240 hour(s))  Resp Panel by RT-PCR (Flu A&B, Covid) Nasopharyngeal Swab     Status:  None   Collection Time: 01/12/21  2:17 AM   Specimen: Nasopharyngeal Swab; Nasopharyngeal(NP) swabs in vial transport medium  Result Value Ref Range Status   SARS Coronavirus 2 by RT PCR NEGATIVE NEGATIVE Final    Comment: (NOTE) SARS-CoV-2 target nucleic acids are NOT DETECTED.  The SARS-CoV-2 RNA is generally detectable in upper respiratory specimens during the acute phase of infection. The lowest concentration of SARS-CoV-2 viral copies this assay can detect is 138 copies/mL. A negative result does not preclude SARS-Cov-2 infection and should not be used as the sole basis for treatment or other patient management decisions. A negative result may occur with  improper specimen collection/handling, submission of specimen other than nasopharyngeal swab, presence of viral mutation(s) within the areas targeted by this assay, and inadequate number of viral copies(<138 copies/mL). A negative result must be combined with clinical observations, patient history, and epidemiological information. The expected result is Negative.  Fact Sheet for Patients:  BloggerCourse.com  Fact Sheet for Healthcare Providers:  SeriousBroker.it  This test is no t yet approved or cleared by the [place] FDA and  has been authorized for detection and/or diagnosis of SARS-CoV-2 by FDA under an Emergency Use Authorization (EUA). This EUA will remain  in effect (meaning this test can be used) for the duration of the COVID-19 declaration under Section 564(b)(1) of the Act, 21 U.S.C.section 360bbb-3(b)(1), unless the authorization is terminated  or revoked sooner.       Influenza A by PCR NEGATIVE NEGATIVE Final   Influenza B by PCR NEGATIVE NEGATIVE Final    Comment: (NOTE) The Xpert Xpress SARS-CoV-2/FLU/RSV plus assay is intended as an aid in the diagnosis of influenza from Nasopharyngeal swab specimens and should not be used as a sole basis for  treatment. Nasal washings and aspirates are unacceptable for Xpert Xpress SARS-CoV-2/FLU/RSV testing.  Fact Sheet for Patients: BloggerCourse.com  Fact Sheet for Healthcare Providers: SeriousBroker.it  This test is not yet approved or cleared by the [place] FDA and has been authorized for detection and/or diagnosis of SARS-CoV-2 by FDA under an Emergency Use Authorization (EUA). This EUA will remain in effect (meaning this test can be used) for the duration of the COVID-19 declaration under Section 564(b)(1) of the Act, 21 U.S.C. section 360bbb-3(b)(1), unless the authorization is terminated or revoked.  Performed at [facility], [address], [place], [place] [address]   Culture, blood (Routine X 2) w Reflex to ID Panel     Status: None (Preliminary result)   Collection Time: 01/12/21  6:29 AM   Specimen: BLOOD  Result Value Ref Range Status   Specimen Description   Final    BLOOD BLOOD RIGHT HAND Performed at [facility], [address], [place], [place] [address]    Special Requests   Final    BOTTLES DRAWN AEROBIC AND ANAEROBIC Blood Culture adequate volume Performed at [facility], [address] [place], [place] [address]    Culture   Final    NO GROWTH 4 DAYS Performed at [facility], [address] [place], [place] [address]    Report Status PENDING  Incomplete  Culture, blood (Routine X 2) w Reflex to ID Panel     Status: None (Preliminary result)   Collection Time: 01/12/21  6:29 AM   Specimen: BLOOD  Result Value Ref Range Status   Specimen Description   Final    BLOOD BLOOD LEFT FOREARM Performed at [facility], [address], [place], [place] [address]    Special Requests   Final    BOTTLES DRAWN AEROBIC AND ANAEROBIC Blood Culture adequate volume Performed at [facility], [address], [place], [place] [address]    Culture   Final    NO GROWTH 4 DAYS Performed at [facility], [address], [place], [place] [address]    Report Status PENDING  Incomplete  MRSA Next Gen by PCR, Nasal     Status: None   Collection Time: 01/12/21  8:41 AM  Result Value Ref Range Status   MRSA by PCR Next Gen NOT DETECTED NOT DETECTED Final    Comment: (NOTE) The GeneXpert MRSA Assay (FDA approved for NASAL specimens only), is one component of a comprehensive MRSA colonization surveillance program. It is not intended to diagnose MRSA infection nor to guide or monitor treatment for MRSA infections. Test performance is not FDA approved in patients less than 67 years old. Performed at [facility], [address] [place], [place] [address]       Studies: VAS US RENAL ARTERY DUPLEX  Result Date: 01/15/2021 ABDOMINAL VISCERAL Patient Name:  ARBEN PACKMAN  Date of Exam:   01/15/2021 Medical Rec #: [id number]        Accession #:    [id number] Date of Birth: 1981-02-19        Patient Gender: M Patient Age:   1 years Exam Location:  [facility] Procedure:      VAS US RENAL ARTERY DUPLEX Referring Phys: [id number] WHITNEY D HARRIS -------------------------------------------------------------------------------- Indications: Hypertension High Risk Factors: Hypertension, Diabetes. Limitations: Air/bowel gas, obesity and patient respiratory distrubance, patient movement. Comparison Study: No prior studies. Performing Technologist: Chanda Busing RVT  Examination Guidelines: A complete evaluation includes B-mode imaging, spectral Doppler, color Doppler, and power Doppler as needed of all accessible portions of each vessel. Bilateral testing is considered an integral part of a complete examination. Limited examinations for reoccurring indications may be performed as noted.  Duplex Findings: +--------------------+--------+--------+------+--------+ Mesenteric          PSV  cm/sEDV cm/sPlaqueComments +--------------------+--------+--------+------+--------+ Aorta Mid             204      22                  +--------------------+--------+--------+------+--------+  Celiac Artery Origin  109                          +--------------------+--------+--------+------+--------+ SMA Proximal          185      19                  +--------------------+--------+--------+------+--------+    +------------------+--------+--------+-------+ Right Renal ArteryPSV cm/sEDV cm/sComment +------------------+--------+--------+-------+ Origin               33      8            +------------------+--------+--------+-------+ Proximal             79      14           +------------------+--------+--------+-------+ Mid                  62      15           +------------------+--------+--------+-------+ Distal               46      15           +------------------+--------+--------+-------+ +-----------------+--------+--------+-------+ Left Renal ArteryPSV cm/sEDV cm/sComment +-----------------+--------+--------+-------+ Origin              66      12           +-----------------+--------+--------+-------+ Proximal            66      13           +-----------------+--------+--------+-------+ Mid                 33      10           +-----------------+--------+--------+-------+ Distal              34      8            +-----------------+--------+--------+-------+ +------------+--------+--------+----+-----------+--------+--------+----+ Right KidneyPSV cm/sEDV cm/sRI  Left KidneyPSV cm/sEDV cm/sRI   +------------+--------+--------+----+-----------+--------+--------+----+ Upper Pole  14      6       0.60Upper Pole 11      5       0.52 +------------+--------+--------+----+-----------+--------+--------+----+ Mid         17      5       0.        13      6       0.54  +------------+--------+--------+----+-----------+--------+--------+----+ Lower Pole  14      4       0.69Lower Pole 15      5       0.68 +------------+--------+--------+----+-----------+--------+--------+----+ Hilar       18      6       0.65Hilar      36      6       0.83 +------------+--------+--------+----+-----------+--------+--------+----+ +------------------+-----+------------------+-----+ Right Kidney           Left Kidney             +------------------+-----+------------------+-----+ RAR                    RAR                     +------------------+-----+------------------+-----+ RAR (manual)      0.39 RAR (manual)      0.32  +------------------+-----+------------------+-----+  Cortex                 Cortex                  +------------------+-----+------------------+-----+ Cortex thickness       Corex thickness         +------------------+-----+------------------+-----+ Kidney length (cm)12.50Kidney length (cm)11.50 +------------------+-----+------------------+-----+  Summary: Renal:  Right: Normal size right kidney. Normal right Resisitive Index.        1-59% stenosis of the right renal artery. Left:  Normal size of left kidney. Normal left Resistive Index.        1-59% stenosis of the left renal artery.  *See table(s) above for measurements and observations.  Diagnosing physician: Lemar Livings MD  Electronically signed by Lemar Livings MD on 01/15/2021 at 1:49:53 PM.    Final       Joycelyn Das, MD  [facility] 01/16/2021  If 7PM-7AM, please contact night-coverage

## 2021-01-16 NOTE — Progress Notes (Signed)
[facility] Progress Note    Assessment/ Plan:   AKI on CKD3: -last known Cr was 1.6 in 11/2019. Cr currently stable today 2.9, remains non-oliguric. Unclear at this junction if he has had some progression of disease. 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 AKI likely related to HTN emergency and infection -Renal artery duplex today-no RAS -check UA w/ microscopy significant for hyaline casts. Historically, has proteinuria--will quantify -currently on lasix 40mg  IV BID. If Cr worsens, would recommend holding this. -Continue to monitor daily Cr, Dose meds for GFR<15 -Monitor Daily I/Os, Daily weight  -Maintain MAP>65 for optimal renal perfusion.  -Avoid further nephrotoxins including NSAIDS, Morphine.  Unless absolutely necessary, avoid CT with contrast and/or MRI with gadolinium.      AHRF secondary to multifocal pneumonia, possibly dCHF related as well -despite lasix, cxr is unchanged. Abx per primary service, s/p azithromycin and rocephin -recommend holding lasix if Cr worsens. Still hypervolemic, would continue today, check daily weights and strict I/O -repeat CXR?   Hypertension emergency: -Currently on amlodipine, Coreg, clonidine patch, Lasix IV, hydralazine, Imdur. 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 If results are abnormal, will need MRI abd w and wo contrast to rule out an adenoma at the very least   Anemia due to chronic disease: -Transfuse for Hgb<7 g/dL. Hgb currently acceptable/stable   Diabetes Mellitus Type 2 -per primary    Subjective:   No acute events. Continues to feel better in regards to breathing. No other complaints, has been eating and  keeping hydrated.   Objective:   BP (!) 150/90   Pulse 66   Temp 97.7 F (36.5 C) (Oral)   Resp 18   Ht 5\' 8"  (1.727 m)   Wt 113.2 kg   SpO2 100%   BMI 37.95 kg/m   Intake/Output Summary (Last 24 hours) at 01/16/2021 1328 Last data filed at 01/16/2021 0530 Gross per 24 hour  Intake 144.4 ml  Output 875 ml  Net -730.6 ml   Weight change: 1.6 kg  Physical Exam: Gen:nad CVS:rrr Resp:poor air exchange b/l, on [place] 03/18/2021, soft Ext:2+ pitting edema b/l LE's Neuro: awake, alert  Imaging: VAS 03/18/2021 RENAL ARTERY DUPLEX  Result Date: 01/15/2021 ABDOMINAL VISCERAL Patient Name:  Micheal Levy  Date of Exam:   01/15/2021 Medical Rec #: [name]        Accession #:    03/17/2021 Date of Birth: 19-Apr-1980        Patient Gender: M Patient Age:   40 years Exam Location:  [facility] Procedure:      VAS 45 RENAL ARTERY DUPLEX Referring Phys: [facility] -------------------------------------------------------------------------------- Indications: Hypertension High Risk Factors: Hypertension, Diabetes. Limitations: Air/bowel gas, obesity and patient respiratory distrubance, patient movement. Comparison Study: No prior studies. Performing Technologist: [place] RVT  Examination Guidelines: A complete evaluation includes B-mode imaging, spectral Doppler, color Doppler, and power Doppler as needed of all accessible portions of each vessel. Bilateral testing is considered an integral part of a complete examination. Limited examinations for reoccurring indications may be performed as noted.  Duplex Findings: +--------------------+--------+--------+------+--------+ Mesenteric          PSV cm/sEDV cm/sPlaqueComments +--------------------+--------+--------+------+--------+ Aorta Mid  204      22                  +--------------------+--------+--------+------+--------+ Celiac Artery Origin  109                           +--------------------+--------+--------+------+--------+ SMA Proximal          185      19                  +--------------------+--------+--------+------+--------+    +------------------+--------+--------+-------+ Right Renal ArteryPSV cm/sEDV cm/sComment +------------------+--------+--------+-------+ Origin               33      8            +------------------+--------+--------+-------+ Proximal             79      14           +------------------+--------+--------+-------+ Mid                  62      15           +------------------+--------+--------+-------+ Distal               46      15           +------------------+--------+--------+-------+ +-----------------+--------+--------+-------+ Left Renal ArteryPSV cm/sEDV cm/sComment +-----------------+--------+--------+-------+ Origin              66      12           +-----------------+--------+--------+-------+ Proximal            66      13           +-----------------+--------+--------+-------+ Mid                 33      10           +-----------------+--------+--------+-------+ Distal              34      8            +-----------------+--------+--------+-------+ +------------+--------+--------+----+-----------+--------+--------+----+ Right KidneyPSV cm/sEDV cm/sRI  Left KidneyPSV cm/sEDV cm/sRI   +------------+--------+--------+----+-----------+--------+--------+----+ Upper Pole  14      6       0.60Upper Pole 11      5       0.52 +------------+--------+--------+----+-----------+--------+--------+----+ Mid         17      5       0.        13      6       0.54 +------------+--------+--------+----+-----------+--------+--------+----+ Lower Pole  14      4       0.69Lower Pole 15      5       0.68 +------------+--------+--------+----+-----------+--------+--------+----+ Hilar       18      6       0.65Hilar      36      6       0.83  +------------+--------+--------+----+-----------+--------+--------+----+ +------------------+-----+------------------+-----+ Right Kidney           Left Kidney             +------------------+-----+------------------+-----+ RAR                    RAR                     +------------------+-----+------------------+-----+  RAR (manual)      0.39 RAR (manual)      0.32  +------------------+-----+------------------+-----+ Cortex                 Cortex                  +------------------+-----+------------------+-----+ Cortex thickness       Corex thickness         +------------------+-----+------------------+-----+ Kidney length (cm)12.50Kidney length (cm)11.50 +------------------+-----+------------------+-----+  Summary: Renal:  Right: Normal size right kidney. Normal right Resisitive Index.        1-59% stenosis of the right renal artery. Left:  Normal size of left kidney. Normal left Resistive Index.        1-59% stenosis of the left renal artery.  *See table(s) above for measurements and observations.  Diagnosing physician: Lemar Livings MD  Electronically signed by Lemar Livings MD on 01/15/2021 at 1:49:53 PM.    Final     Labs: BMET Recent Labs  Lab 01/12/21 0202 01/12/21 1429 01/13/21 0309 01/14/21 0319 01/15/21 0255 01/16/21 0259  NA 134* 132* 137 140 134* 135  K 2.8* 3.2* 3.3* 3.1* 4.1 4.0  CL 96* 96* 100 103 99 100  CO2 25 25 25 27 26 25   GLUCOSE 169* 161* 123* 135* 114* 119*  BUN 25* 28* 33* 40* 45* 43*  CREATININE 2.29* 2.41* 2.65* 2.92* 2.90* 2.87*  CALCIUM 8.8* 7.8* 8.4* 8.5* 8.6* 8.6*  PHOS  --   --  3.6  --   --   --    CBC Recent Labs  Lab 01/12/21 0202 01/13/21 0309 01/14/21 0319 01/15/21 0255 01/16/21 0259  WBC 8.4 9.5 10.7* 9.4 7.3  NEUTROABS 7.0  --   --   --   --   HGB 14.4 11.6* 11.2* 11.2* 11.4*  HCT 46.3 36.6* 35.5* 37.2* 37.2*  MCV 80.8 79.4* 81.1 82.5 83.0  PLT 288 284 300 340 438*    Medications:     amLODipine  10 mg Oral  Daily   aspirin EC  81 mg Oral Daily   carvedilol  12.5 mg Oral BID WC   chlorhexidine  15 mL Mouth Rinse BID   Chlorhexidine Gluconate Cloth  6 each Topical Daily   cloNIDine  0.1 mg Transdermal Weekly   furosemide  40 mg Intravenous BID   heparin  5,000 Units Subcutaneous Q8H   hydrALAZINE  100 mg Oral TID   insulin aspart  0-5 Units Subcutaneous QHS   insulin aspart  0-9 Units Subcutaneous TID WC   insulin glargine-yfgn  5 Units Subcutaneous QHS   isosorbide mononitrate  30 mg Oral Daily   levETIRAcetam  1,000 mg Oral BID   pantoprazole  40 mg Oral Daily      03/18/21, MD [facility] 01/16/2021, 1:28 PM

## 2021-01-17 LAB — CBC
HCT: 38 % — ABNORMAL LOW (ref 39.0–52.0)
Hemoglobin: 11.5 g/dL — ABNORMAL LOW (ref 13.0–17.0)
MCH: 25.3 pg — ABNORMAL LOW (ref 26.0–34.0)
MCHC: 30.3 g/dL (ref 30.0–36.0)
MCV: 83.7 fL (ref 80.0–100.0)
Platelets: 523 10*3/uL — ABNORMAL HIGH (ref 150–400)
RBC: 4.54 MIL/uL (ref 4.22–5.81)
RDW: 17.2 % — ABNORMAL HIGH (ref 11.5–15.5)
WBC: 6.3 10*3/uL (ref 4.0–10.5)
nRBC: 0 % (ref 0.0–0.2)

## 2021-01-17 LAB — CULTURE, BLOOD (ROUTINE X 2)
Culture: NO GROWTH
Culture: NO GROWTH
Special Requests: ADEQUATE
Special Requests: ADEQUATE

## 2021-01-17 LAB — BASIC METABOLIC PANEL
Anion gap: 12 (ref 5–15)
BUN: 44 mg/dL — ABNORMAL HIGH (ref 6–20)
CO2: 24 mmol/L (ref 22–32)
Calcium: 8.9 mg/dL (ref 8.9–10.3)
Chloride: 103 mmol/L (ref 98–111)
Creatinine, Ser: 2.38 mg/dL — ABNORMAL HIGH (ref 0.61–1.24)
GFR, Estimated: 34 mL/min — ABNORMAL LOW (ref >60)
Glucose, Bld: 116 mg/dL — ABNORMAL HIGH (ref 70–99)
Potassium: 3.7 mmol/L (ref 3.5–5.1)
Sodium: 139 mmol/L (ref 135–145)

## 2021-01-17 LAB — MAGNESIUM: Magnesium: 2.1 mg/dL (ref 1.7–2.4)

## 2021-01-17 LAB — GLUCOSE, CAPILLARY
Glucose-Capillary: 106 mg/dL — ABNORMAL HIGH (ref 70–99)
Glucose-Capillary: 122 mg/dL — ABNORMAL HIGH (ref 70–99)
Glucose-Capillary: 131 mg/dL — ABNORMAL HIGH (ref 70–99)
Glucose-Capillary: 152 mg/dL — ABNORMAL HIGH (ref 70–99)
Glucose-Capillary: 187 mg/dL — ABNORMAL HIGH (ref 70–99)

## 2021-01-17 LAB — ALDOSTERONE + RENIN ACTIVITY W/ RATIO
ALDO / PRA Ratio: 17.4 (ref 0.0–30.0)
Aldosterone: 24.2 ng/dL (ref 0.0–30.0)
PRA LC/MS/MS: 1.394 ng/mL/hr (ref 0.167–5.380)

## 2021-01-17 MED ORDER — CLONIDINE HCL 0.2 MG/24HR TD PTWK
0.2000 mg | MEDICATED_PATCH | TRANSDERMAL | Status: DC
  Administered 2021-01-17: 0.2 mg via TRANSDERMAL
  Filled 2021-01-17 (×2): qty 1

## 2021-01-17 MED ORDER — CARVEDILOL 25 MG PO TABS
25.0000 mg | ORAL_TABLET | Freq: Two times a day (BID) | ORAL | Status: DC
  Administered 2021-01-17 – 2021-01-19 (×5): 25 mg via ORAL
  Filled 2021-01-17 (×3): qty 1
  Filled 2021-01-17: qty 2
  Filled 2021-01-17: qty 1

## 2021-01-17 NOTE — Plan of Care (Signed)

## 2021-01-17 NOTE — Progress Notes (Signed)
PROGRESS NOTE  Micheal Levy YQI:[id number] DOB: Sep 13, 1980 DOA: 01/12/2021 PCP: Barbette Merino, NP   LOS: 5 days   Brief Narrative / Interim history: 40 year old male with history of CKD 3A, DM 2, HTN, prior CVA, seizure disorder, prior substance abuse comes into the hospital with shortness of breath.  She was hypoxic in the ER, tachypneic and severely hypertensive.  Chest CT showed nodular and complete infiltrates throughout both lungs.  He was hypokalemic.  He was found to be significantly fluid overloaded, BNP was elevated and he was started on Lasix along with antibiotics.  He also required nitroglycerin infusion due to severely elevated blood pressure.  Subjective / 24h Interval events: Doing a bit better this morning, denies any significant shortness of breath.  No chest pain.  Assessment & Plan: Principal Problem Acute hypoxic respiratory failure secondary to multifocal pneumonia and heart failure, possible underlying obstructive sleep apnea - Patient required BiPAP for respiratory failure, now on nasal cannula oxygen.  Wean off to room air as tolerated. -He is status post 5 days of ceftriaxone and azithromycin, completed course 10/12.  Strep pneumo and Legionella antigen negative.  Active Problems Acute on chronic diastolic CHF likely exacerbated by hypertensive emergency -Multiple antihypertensives.  On IV diuretics as well.  Still on supplemental oxygen this morning, wean off to room air as tolerated  Hypertensive emergency-Initially received nitro drip.  Off nitro drip at the time and has been initiated on hydralazine amlodipine clonidine patch.  Increase clonidine as well as Coreg dose due to persistently elevated blood pressure.  No renal artery stenosis noted on the ultrasound.    Hypokalemia-Improved after replacement.  Continue to monitor.   Possible AKI on CKD IIIa -Serum creatinine was 2.2 on admission.  Creatinine was 1.6 in August 2021. Renal duplex was negative for  hemodynamically significant stenosis.  Nephrology has been consulted for follow-up.  Creatinine overall stable, improving on diuretics, 2.3 this morning   History of seizures  -No reported seizures in 1 year.  Continue Keppra from home.   Type II DM -Latest hemoglobin A1c was 6.5% in August 2021.  Hemoglobin A1c from 01/12/2021 at 7.1.  On sliding scale insulin.   Patient is on glipizide and Lantus at home.  Lantus dose is uncertain at this time so patient was started on 5 units at nighttime.   History of CVA  -Continue statins and aspirin.   Elevated troponin -Likely demand ischemia.  2D echocardiogram with preserved LV function.  Patient had accelerated hypertension, respiratory failure pneumonia on presentation.  Scheduled Meds:  amLODipine  10 mg Oral Daily   aspirin EC  81 mg Oral Daily   carvedilol  25 mg Oral BID WC   chlorhexidine  15 mL Mouth Rinse BID   Chlorhexidine Gluconate Cloth  6 each Topical Daily   cloNIDine  0.2 mg Transdermal Weekly   furosemide  40 mg Intravenous BID   heparin  5,000 Units Subcutaneous Q8H   hydrALAZINE  100 mg Oral TID   insulin aspart  0-5 Units Subcutaneous QHS   insulin aspart  0-9 Units Subcutaneous TID WC   insulin glargine-yfgn  5 Units Subcutaneous QHS   isosorbide mononitrate  30 mg Oral Daily   levETIRAcetam  1,000 mg Oral BID   pantoprazole  40 mg Oral Daily   Continuous Infusions:  sodium chloride Stopped (01/16/21 1557)   PRN Meds:.sodium chloride, acetaminophen, albuterol, guaiFENesin-dextromethorphan, hydrALAZINE, labetalol, LORazepam, ondansetron (ZOFRAN) IV  Diet Orders (From admission, onward)  Start     Ordered   01/12/21 0553  Diet heart healthy/carb modified Room service appropriate? Yes; Fluid consistency: Thin  Diet effective now       Question Answer Comment  Diet-HS Snack? Nothing   Room service appropriate? Yes   Fluid consistency: Thin      01/12/21 0604            DVT prophylaxis: heparin injection  5,000 Units Start: 01/12/21 0615     Code Status: Full Code  Family Communication: No family at bedside  Status is: Inpatient  Remains inpatient appropriate because:Inpatient level of care appropriate due to severity of illness  Dispo: The patient is from: Home              Anticipated d/c is to: Home              Patient currently is not medically stable to d/c.   Difficult to place patient No   Level of care: Stepdown  Consultants:  Nephrology   Procedures:  2D echo  Microbiology  none  Antimicrobials: none    Objective: Vitals:   01/17/21 0800 01/17/21 0858 01/17/21 0900 01/17/21 1000  BP: (!) 159/78 (!) 159/78    Pulse: 81 83 81 83  Resp: (!) 28  (!) 21 (!) 27  Temp: 98.8 F (37.1 C)     TempSrc: Oral     SpO2: 97%  96% 96%  Weight:      Height:        Intake/Output Summary (Last 24 hours) at 01/17/2021 1027 Last data filed at 01/17/2021 0830 Gross per 24 hour  Intake 608.85 ml  Output 3400 ml  Net -2791.15 ml   Filed Weights   01/15/21 0500 01/16/21 0527 01/17/21 0400  Weight: 111.6 kg 113.2 kg 113.6 kg    Examination:  Constitutional: NAD Eyes: no scleral icterus ENMT: Mucous membranes are moist.  Neck: normal, supple Respiratory: clear to auscultation bilaterally, no wheezing, no crackles. Normal respiratory effort. No accessory muscle use.  Cardiovascular: Regular rate and rhythm, no murmurs / rubs / gallops.  1+ pitting lower extremity edema Abdomen: non distended, no tenderness. Bowel sounds positive.  Musculoskeletal: no clubbing / cyanosis.  Skin: no rashes Neurologic: non focal    Data Reviewed: I have independently reviewed following labs and imaging studies   CBC: Recent Labs  Lab 01/12/21 0202 01/13/21 0309 01/14/21 0319 01/15/21 0255 01/16/21 0259 01/17/21 0259  WBC 8.4 9.5 10.7* 9.4 7.3 6.3  NEUTROABS 7.0  --   --   --   --   --   HGB 14.4 11.6* 11.2* 11.2* 11.4* 11.5*  HCT 46.3 36.6* 35.5* 37.2* 37.2* 38.0*  MCV  80.8 79.4* 81.1 82.5 83.0 83.7  PLT 288 284 300 340 438* 523*   Basic Metabolic Panel: Recent Labs  Lab 01/13/21 0309 01/14/21 0319 01/15/21 0255 01/16/21 0259 01/17/21 0259  NA 137 140 134* 135 139  K 3.3* 3.1* 4.1 4.0 3.7  CL 100 103 99 100 103  CO2 25 27 26 25 24   GLUCOSE 123* 135* 114* 119* 116*  BUN 33* 40* 45* 43* 44*  CREATININE 2.65* 2.92* 2.90* 2.87* 2.38*  CALCIUM 8.4* 8.5* 8.6* 8.6* 8.9  MG 1.7 1.9 2.2 2.0 2.1  PHOS 3.6  --   --   --   --    Liver Function Tests: Recent Labs  Lab 01/12/21 0202  AST 43*  ALT 23  ALKPHOS 52  BILITOT 1.3*  PROT  7.4  ALBUMIN 3.3*   Coagulation Profile: No results for input(s): INR, PROTIME in the last 168 hours. HbA1C: No results for input(s): HGBA1C in the last 72 hours. CBG: Recent Labs  Lab 01/16/21 0723 01/16/21 1128 01/16/21 1648 01/16/21 2140 01/17/21 0749  GLUCAP 135* 126* 108* 138* 106*    Recent Results (from the past 240 hour(s))  Resp Panel by RT-PCR (Flu A&B, Covid) Nasopharyngeal Swab     Status: None   Collection Time: 01/12/21  2:17 AM   Specimen: Nasopharyngeal Swab; Nasopharyngeal(NP) swabs in vial transport medium  Result Value Ref Range Status   SARS Coronavirus 2 by RT PCR NEGATIVE NEGATIVE Final    Comment: (NOTE) SARS-CoV-2 target nucleic acids are NOT DETECTED.  The SARS-CoV-2 RNA is generally detectable in upper respiratory specimens during the acute phase of infection. The lowest concentration of SARS-CoV-2 viral copies this assay can detect is 138 copies/mL. A negative result does not preclude SARS-Cov-2 infection and should not be used as the sole basis for treatment or other patient management decisions. A negative result may occur with  improper specimen collection/handling, submission of specimen other than nasopharyngeal swab, presence of viral mutation(s) within the areas targeted by this assay, and inadequate number of viral copies(<138 copies/mL). A negative result must be  combined with clinical observations, patient history, and epidemiological information. The expected result is Negative.  Fact Sheet for Patients:  BloggerCourse.com  Fact Sheet for Healthcare Providers:  SeriousBroker.it  This test is no t yet approved or cleared by the [place] FDA and  has been authorized for detection and/or diagnosis of SARS-CoV-2 by FDA under an Emergency Use Authorization (EUA). This EUA will remain  in effect (meaning this test can be used) for the duration of the COVID-19 declaration under Section 564(b)(1) of the Act, 21 U.S.C.section 360bbb-3(b)(1), unless the authorization is terminated  or revoked sooner.       Influenza A by PCR NEGATIVE NEGATIVE Final   Influenza B by PCR NEGATIVE NEGATIVE Final    Comment: (NOTE) The Xpert Xpress SARS-CoV-2/FLU/RSV plus assay is intended as an aid in the diagnosis of influenza from Nasopharyngeal swab specimens and should not be used as a sole basis for treatment. Nasal washings and aspirates are unacceptable for Xpert Xpress SARS-CoV-2/FLU/RSV testing.  Fact Sheet for Patients: BloggerCourse.com  Fact Sheet for Healthcare Providers: SeriousBroker.it  This test is not yet approved or cleared by the [place] FDA and has been authorized for detection and/or diagnosis of SARS-CoV-2 by FDA under an Emergency Use Authorization (EUA). This EUA will remain in effect (meaning this test can be used) for the duration of the COVID-19 declaration under Section 564(b)(1) of the Act, 21 U.S.C. section 360bbb-3(b)(1), unless the authorization is terminated or revoked.  Performed at [facility], [address], [place], [place] [address]   Culture, blood (Routine X 2) w Reflex to ID Panel     Status: None   Collection Time: 01/12/21  6:29 AM   Specimen: BLOOD  Result Value Ref Range Status    Specimen Description   Final    BLOOD BLOOD RIGHT HAND Performed at [facility], [address] [place], [place] [address]    Special Requests   Final    BOTTLES DRAWN AEROBIC AND ANAEROBIC Blood Culture adequate volume Performed at [facility], [address] [place], [place] [address]    Culture   Final    NO GROWTH 5 DAYS Performed at  [facility], [address] [place], [place] [address]    Report Status 01/17/2021 FINAL  Final  Culture, blood (Routine X 2) w Reflex to ID Panel     Status: None   Collection Time: 01/12/21  6:29 AM   Specimen: BLOOD  Result Value Ref Range Status   Specimen Description   Final    BLOOD BLOOD LEFT FOREARM Performed at [facility], [address], [place], [place] [address]    Special Requests   Final    BOTTLES DRAWN AEROBIC AND ANAEROBIC Blood Culture adequate volume Performed at [facility], [address], [place], [place] [address]    Culture   Final    NO GROWTH 5 DAYS Performed at [facility], [address] [place], [place] [address]    Report Status 01/17/2021 FINAL  Final  MRSA Next Gen by PCR, Nasal     Status: None   Collection Time: 01/12/21  8:41 AM  Result Value Ref Range Status   MRSA by PCR Next Gen NOT DETECTED NOT DETECTED Final    Comment: (NOTE) The GeneXpert MRSA Assay (FDA approved for NASAL specimens only), is one component of a comprehensive MRSA colonization surveillance program. It is not intended to diagnose MRSA infection nor to guide or monitor treatment for MRSA infections. Test performance is not FDA approved in patients less than 67 years old. Performed at [facility], [address], [place], [place] [address]      Radiology Studies: No results found.   Pamella Pert, MD, PhD [facility]  Between 7 am - 7 pm I am available, please contact me via Amion (for emergencies) or  Securechat (non urgent messages)  Between 7 pm - 7 am I am not available, please contact night coverage MD/APP via Amion

## 2021-01-17 NOTE — Progress Notes (Signed)
[facility] Progress Note    Assessment/ Plan:   AKI on CKD3: -last known Cr was 1.6 in 11/2019. Cr currently stable today 2.9, remains non-oliguric. Unclear at this junction if he has had some progression of disease. 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 AKI likely related to HTN emergency and infection. Cr better today, down to 2.4, remains nonoliguric. -Renal artery duplex today-no RAS -UA w/ microscopy significant for hyaline casts. Historically, has proteinuria--will quantify -currently on lasix 40mg  IV BID. If Cr worsens, would recommend holding this. -Continue to monitor daily Cr, Dose meds for GFR<15 -Monitor Daily I/Os, Daily weight  -Maintain MAP>65 for optimal renal perfusion.  -Avoid further nephrotoxins including NSAIDS, Morphine.  Unless absolutely necessary, avoid CT with contrast and/or MRI with gadolinium.      AHRF secondary to multifocal pneumonia, possibly dCHF related as well -despite lasix, cxr is unchanged. Abx per primary service, s/p azithromycin and rocephin -recommend holding lasix if Cr worsens. Still hypervolemic, would continue today, check daily standing weights and strict I/O -repeat CXR?   Hypertension emergency: -Currently on amlodipine, Coreg, clonidine patch, Lasix IV, hydralazine, Imdur. BP is better as compared to admit. 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 If results are abnormal, will need MRI abd w and wo contrast to rule out an adenoma at the very least -If needed, would recommend adding spironolactone (can replace amlodipine with this and titrate accordingly)   Anemia due to chronic disease: -Transfuse for Hgb<7 g/dL. Hgb currently acceptable/stable   Diabetes Mellitus Type 2 -per primary     Subjective:   No acute events. Breathing improving, still has swelling but also better. Out of ICU now and no longer requiring O2.No other complaints. Uop 2.8L   Objective:   BP (!) 157/92   Pulse 72   Temp (!) 97.5 F (36.4 C) (Oral)   Resp (!) 22   Ht 5\' 8"  (1.727 m)   Wt 113.6 kg   SpO2 92%   BMI 38.08 kg/m   Intake/Output Summary (Last 24 hours) at 01/17/2021 1342 Last data filed at 01/17/2021 1307 Gross per 24 hour  Intake 608.85 ml  Output 2650 ml  Net -2041.15 ml   Weight change: 0.4 kg  Physical Exam: Gen:nad CVS:rrr Resp:poor air exchange b/l, on [place] 01/19/2021, soft Ext:2+ pitting edema b/l LE's Neuro: awake, alert  Imaging: No results found.  Labs: BMET Recent Labs  Lab 01/12/21 0202 01/12/21 1429 01/13/21 0309 01/14/21 0319 01/15/21 0255 01/16/21 0259 01/17/21 0259  NA 134* 132* 137 140 134* 135 139  K 2.8* 3.2* 3.3* 3.1* 4.1 4.0 3.7  CL 96* 96* 100 103 99 100 103  CO2 25 25 25 27 26 25 24   GLUCOSE 169* 161* 123* 135* 114* 119* 116*  BUN 25* 28* 33* 40* 45* 43* 44*  CREATININE 2.29* 2.41* 2.65* 2.92* 2.90* 2.87* 2.38*  CALCIUM 8.8* 7.8* 8.4* 8.5* 8.6* 8.6* 8.9  PHOS  --   --  3.6  --   --   --   --    CBC Recent Labs  Lab 01/12/21 0202 01/13/21 0309 01/14/21 0319 01/15/21 0255 01/16/21 0259 01/17/21 0259  WBC 8.4   < > 10.7* 9.4 7.3 6.3  NEUTROABS 7.0  --   --   --   --   --  HGB 14.4   < > 11.2* 11.2* 11.4* 11.5*  HCT 46.3   < > 35.5* 37.2* 37.2* 38.0*  MCV 80.8   < > 81.1 82.5 83.0 83.7  PLT 288   < > 300 340 438* 523*   < > = values in this interval not displayed.    Medications:     amLODipine  10 mg Oral Daily   aspirin EC  81 mg Oral Daily   carvedilol  25 mg Oral BID WC   chlorhexidine  15 mL Mouth Rinse BID   Chlorhexidine Gluconate Cloth  6 each Topical Daily   cloNIDine  0.2 mg Transdermal Weekly   furosemide  40 mg Intravenous BID   heparin  5,000 Units Subcutaneous Q8H   hydrALAZINE  100 mg Oral TID    insulin aspart  0-5 Units Subcutaneous QHS   insulin aspart  0-9 Units Subcutaneous TID WC   insulin glargine-yfgn  5 Units Subcutaneous QHS   isosorbide mononitrate  30 mg Oral Daily   levETIRAcetam  1,000 mg Oral BID   pantoprazole  40 mg Oral Daily      Anthony Sar, MD  [facility] 01/17/2021, 1:42 PM

## 2021-01-17 NOTE — Evaluation (Signed)
Physical Therapy Evaluation Patient Details Name: Micheal Levy MRN: [id number] DOB: 1980-06-22 Today's Date: 01/17/2021  History of Present Illness  40 year old male with history of CKD 3A, DM 2, HTN, prior CVA, seizure disorder, prior substance abuse comes into the hospital  01/12/21 with shortness of breath, hypoxic, tachypneic and severely hypertensive., HYPOKALEMIC.  Chest CT showed nodular and complete infiltrates throughout both lungs.   BNP was elevated . He also required nitroglycerin infusion due to severely elevated blood pressure  Clinical Impression    The patient is slow to respond and move. Does not recal events  getting to hospital. Oriented x 3. Patient independent and resides with family. Unsure of accurate information provided  by patient about house environment.  Patient should progress to return home. Pt admitted with above diagnosis.  Pt currently with functional limitations due to the deficits listed below (see PT Problem List). Pt will benefit from skilled PT to increase their independence and safety with mobility to allow discharge to the venue listed below.        Recommendations for follow up therapy are one component of a multi-disciplinary discharge planning process, led by the attending physician.  Recommendations may be updated based on patient status, additional functional criteria and insurance authorization.  Follow Up Recommendations No PT follow up    Equipment Recommendations   (TBA)    Recommendations for Other Services       Precautions / Restrictions Precautions Precaution Comments: monitor sats      Mobility  Bed Mobility               General bed mobility comments: seated on bed edge    Transfers Overall transfer level: Needs assistance Equipment used: Rolling walker (2 wheeled) Transfers: Sit to/from Stand Sit to Stand: Min guard         General transfer comment: no support  Ambulation/Gait Ambulation/Gait assistance:  Min guard Gait Distance (Feet): 210 Feet Assistive device: Rolling walker (2 wheeled) Gait Pattern/deviations: Step-through pattern Gait velocity: decr   General Gait Details: gait steady with RW on RA >88%  Stairs            Wheelchair Mobility    Modified Rankin (Stroke Patients Only)       Balance Overall balance assessment: Needs assistance Sitting-balance support: No upper extremity supported;Feet supported Sitting balance-Leahy Scale: Good Sitting balance - Comments: pt declined to lean  over to pull up his socks   Standing balance support: Bilateral upper extremity supported;During functional activity Standing balance-Leahy Scale: Fair                               Pertinent Vitals/Pain Pain Assessment: No/denies pain    Home Living Family/patient expects to be discharged to:: Private residence Living Arrangements: Spouse/significant other Available Help at Discharge: Family;Available 24 hours/day Type of Home: House Home Access: Stairs to enter   [employer] of Steps: 15- unsure this is correct, previous encounter stated 3 steps. Home Layout: Two level Home Equipment: None Additional Comments: unsure accurate stairs and levels is correct- patient provided info.    Prior Function Level of Independence: Independent               Hand Dominance   Dominant Hand: Right    Extremity/Trunk Assessment   Upper Extremity Assessment Upper Extremity Assessment: Overall WFL for tasks assessed    Lower Extremity Assessment Lower Extremity Assessment: Overall WFL for tasks assessed  Cervical / Trunk Assessment Cervical / Trunk Assessment: Normal  Communication   Communication: No difficulties  Cognition Arousal/Alertness: Awake/alert Behavior During Therapy: WFL for tasks assessed/performed Overall Cognitive Status: Impaired/Different from baseline Area of Impairment: Awareness                            Awareness: Emergent   General Comments: slow to respond, tends to stay on phone, ? home access accuracy      General Comments      Exercises     Assessment/Plan    PT Assessment Patient needs continued PT services  PT Problem List Decreased strength;Decreased mobility;Decreased safety awareness;Decreased knowledge of precautions;Decreased activity tolerance;Decreased balance       PT Treatment Interventions DME instruction;Therapeutic activities;Gait training;Therapeutic exercise;Patient/family education;Stair training;Functional mobility training    PT Goals (Current goals can be found in the Care Plan section)  Acute Rehab PT Goals Patient Stated Goal: go home to my kids PT Goal Formulation: With patient/family Time For Goal Achievement: 01/31/21 Potential to Achieve Goals: Good    Frequency Min 3X/week   Barriers to discharge        Co-evaluation               AM-PAC PT "6 Clicks" Mobility  Outcome Measure Help needed turning from your back to your side while in a flat bed without using bedrails?: A Little Help needed moving from lying on your back to sitting on the side of a flat bed without using bedrails?: A Little Help needed moving to and from a bed to a chair (including a wheelchair)?: A Little Help needed standing up from a chair using your arms (e.g., wheelchair or bedside chair)?: A Little Help needed to walk in hospital room?: A Little Help needed climbing 3-5 steps with a railing? : A Lot 6 Click Score: 17    End of Session Equipment Utilized During Treatment: Gait belt Activity Tolerance: Patient tolerated treatment well Patient left: in chair;with call bell/phone within reach Nurse Communication: Mobility status PT Visit Diagnosis: Unsteadiness on feet (R26.81)    Time: 1245-8099 PT Time Calculation (min) (ACUTE ONLY): 30 min   Charges:   PT Evaluation $PT Eval Low Complexity: 1 Low PT Treatments $Gait Training: 8-22 mins         Blanchard Kelch PT Acute Rehabilitation Services Pager [phone] Office [phone]   Rada Hay 01/17/2021, 12:29 PM

## 2021-01-18 ENCOUNTER — Inpatient Hospital Stay (HOSPITAL_COMMUNITY): Payer: Medicaid Other

## 2021-01-18 LAB — CBC
HCT: 37.1 % — ABNORMAL LOW (ref 39.0–52.0)
Hemoglobin: 11.1 g/dL — ABNORMAL LOW (ref 13.0–17.0)
MCH: 25 pg — ABNORMAL LOW (ref 26.0–34.0)
MCHC: 29.9 g/dL — ABNORMAL LOW (ref 30.0–36.0)
MCV: 83.6 fL (ref 80.0–100.0)
Platelets: 546 10*3/uL — ABNORMAL HIGH (ref 150–400)
RBC: 4.44 MIL/uL (ref 4.22–5.81)
RDW: 16.7 % — ABNORMAL HIGH (ref 11.5–15.5)
WBC: 6.3 10*3/uL (ref 4.0–10.5)
nRBC: 0 % (ref 0.0–0.2)

## 2021-01-18 LAB — MAGNESIUM: Magnesium: 1.9 mg/dL (ref 1.7–2.4)

## 2021-01-18 LAB — BASIC METABOLIC PANEL
Anion gap: 11 (ref 5–15)
BUN: 39 mg/dL — ABNORMAL HIGH (ref 6–20)
CO2: 24 mmol/L (ref 22–32)
Calcium: 8.7 mg/dL — ABNORMAL LOW (ref 8.9–10.3)
Chloride: 100 mmol/L (ref 98–111)
Creatinine, Ser: 2.29 mg/dL — ABNORMAL HIGH (ref 0.61–1.24)
GFR, Estimated: 36 mL/min — ABNORMAL LOW (ref >60)
Glucose, Bld: 105 mg/dL — ABNORMAL HIGH (ref 70–99)
Potassium: 3.3 mmol/L — ABNORMAL LOW (ref 3.5–5.1)
Sodium: 135 mmol/L (ref 135–145)

## 2021-01-18 LAB — GLUCOSE, CAPILLARY
Glucose-Capillary: 91 mg/dL (ref 70–99)
Glucose-Capillary: 113 mg/dL — ABNORMAL HIGH (ref 70–99)
Glucose-Capillary: 147 mg/dL — ABNORMAL HIGH (ref 70–99)
Glucose-Capillary: 142 mg/dL — ABNORMAL HIGH (ref 70–99)
Glucose-Capillary: 139 mg/dL — ABNORMAL HIGH (ref 70–99)

## 2021-01-18 LAB — MICROALBUMIN / CREATININE URINE RATIO
Creatinine, Urine: 56.2 mg/dL
Microalb Creat Ratio: 610 mg/g creat — ABNORMAL HIGH (ref 0–29)
Microalb, Ur: 342.9 ug/mL — ABNORMAL HIGH

## 2021-01-18 MED ORDER — POTASSIUM CHLORIDE CRYS ER 20 MEQ PO TBCR
40.0000 meq | EXTENDED_RELEASE_TABLET | Freq: Once | ORAL | Status: AC
  Administered 2021-01-18: 40 meq via ORAL
  Filled 2021-01-18: qty 2

## 2021-01-18 NOTE — Progress Notes (Signed)
Physical Therapy Treatment Patient Details Name: Micheal Levy MRN: [id number] DOB: 1980/09/24 Today's Date: 01/18/2021   History of Present Illness 40 year old male with history of CKD 3A, DM 2, HTN, prior CVA, seizure disorder, prior substance abuse comes into the hospital with shortness of breath.  She was hypoxic in the ER, tachypneic and severely hypertensive.  Chest CT showed nodular and complete infiltrates throughout both lungs.  He was hypokalemic.  He was found to be significantly fluid overloaded, BNP was elevated and he was started on Lasix along with antibiotics.  He also required nitroglycerin infusion due to severely elevated blood pressure    PT Comments    Patient is progressing well and ambulated ~400' in 3 minutes with no assistive device and no LOB noted. Pt also able to complete SLS with no UE support for ~5 sec on bil LE's. He has a handrail on steps to enter home and anticipate no difficulty with stairs at discharge. Patient encouraged to mobilize with NT/RN staff on unit and will recommend mobility team follow up with pt to increased activity during hospital stay. Patient no longer requires skilled PT interventions, please re-consult if there is a decline in functional mobility.    Recommendations for follow up therapy are one component of a multi-disciplinary discharge planning process, led by the attending physician.  Recommendations may be updated based on patient status, additional functional criteria and insurance authorization.  Follow Up Recommendations  No PT follow up     Equipment Recommendations  None recommended by PT    Recommendations for Other Services       Precautions / Restrictions Precautions Precautions: Fall Precaution Comments: monitor sats Restrictions Weight Bearing Restrictions: No     Mobility  Bed Mobility               General bed mobility comments: OOB in recliner    Transfers Overall transfer level: Needs  assistance Equipment used: None Transfers: Sit to/from Stand Sit to Stand: Supervision         General transfer comment: supervision for safety; pt steady  Ambulation/Gait Ambulation/Gait assistance: Supervision Gait Distance (Feet): 400 Feet (3 minutes) Assistive device: None Gait Pattern/deviations: WFL(Within Functional Limits);Wide base of support Gait velocity: fair Gait velocity interpretation: 1.31 - 2.62 ft/sec, indicative of limited community ambulator General Gait Details: pt steady with no device. WBOS during gait and slight drift/sway but no overt LOB noted.   Stairs             Wheelchair Mobility    Modified Rankin (Stroke Patients Only)       Balance Overall balance assessment: Mild deficits observed, not formally tested Sitting-balance support: No upper extremity supported;Feet supported Sitting balance-Leahy Scale: Good     Standing balance support: During functional activity;No upper extremity supported Standing balance-Leahy Scale: Good   Single Leg Stance - Right Leg: 7 Single Leg Stance - Left Leg: 5           High Level Balance Comments: Pt unable to perform SLS without set up assist at counter.            Cognition Arousal/Alertness: Awake/alert Behavior During Therapy: WFL for tasks assessed/performed Overall Cognitive Status: Impaired/Different from baseline Area of Impairment: Awareness                           Awareness: Emergent   General Comments: pt easily distracted by TV and by activity in other rooms while ambulating  in hallway      Exercises      General Comments        Pertinent Vitals/Pain Pain Assessment: No/denies pain    Home Living                      Prior Function            PT Goals (current goals can now be found in the care plan section) Acute Rehab PT Goals Patient Stated Goal: go home to my kids PT Goal Formulation: With patient/family Time For Goal  Achievement: 01/31/21 Potential to Achieve Goals: Good Progress towards PT goals: Progressing toward goals    Frequency    Min 3X/week      PT Plan Current plan remains appropriate (PT signing off)    Co-evaluation              AM-PAC PT "6 Clicks" Mobility   Outcome Measure  Help needed turning from your back to your side while in a flat bed without using bedrails?: A Little Help needed moving from lying on your back to sitting on the side of a flat bed without using bedrails?: A Little Help needed moving to and from a bed to a chair (including a wheelchair)?: A Little Help needed standing up from a chair using your arms (e.g., wheelchair or bedside chair)?: A Little Help needed to walk in hospital room?: A Little Help needed climbing 3-5 steps with a railing? : A Little 6 Click Score: 18    End of Session   Activity Tolerance: Patient tolerated treatment well Patient left: in chair;with call bell/phone within reach Nurse Communication: Mobility status PT Visit Diagnosis: Unsteadiness on feet (R26.81)     Time: 1725-1735 PT Time Calculation (min) (ACUTE ONLY): 10 min  Charges:  $Gait Training: 8-22 mins                     Wynn Maudlin, DPT Acute Rehabilitation Services Office [phone] Pager [phone]    Anitra Lauth 01/18/2021, 6:08 PM

## 2021-01-18 NOTE — Progress Notes (Signed)
OT Cancellation Note  Patient Details Name: Micheal Levy MRN: [id number] DOB: 26-Aug-1980   Cancelled Treatment:    Reason Eval/Treat Not Completed: OT screened, no needs identified, will sign off.  Patient reports no needs - reports he is ambulating in room independently and performing ADLs. Reports no weakness or shortness of breath with activity.   Chaitanya Amedee L Leler Brion 01/18/2021, 8:54 AM

## 2021-01-18 NOTE — Progress Notes (Signed)
PROGRESS NOTE  Micheal Levy KGM:[id number] DOB: September 19, 1980 DOA: 01/12/2021 PCP: Barbette Merino, NP   LOS: 6 days   Brief Narrative / Interim history: 40 year old male with history of CKD 3A, DM 2, HTN, prior CVA, seizure disorder, prior substance abuse comes into the hospital with shortness of breath.  She was hypoxic in the ER, tachypneic and severely hypertensive.  Chest CT showed nodular and complete infiltrates throughout both lungs.  He was hypokalemic.  He was found to be significantly fluid overloaded, BNP was elevated and he was started on Lasix along with antibiotics.  He also required nitroglycerin infusion due to severely elevated blood pressure.  Subjective / 24h Interval events: Feeling better, breathing is better.  He was able to walk better on room air.  Assessment & Plan: Principal Problem Acute hypoxic respiratory failure secondary to multifocal pneumonia and heart failure, possible underlying obstructive sleep apnea - Patient required BiPAP for respiratory failure, now on nasal cannula oxygen.  Wean off to room air as tolerated. -He is status post 5 days of ceftriaxone and azithromycin, completed course 10/12.  Strep pneumo and Legionella antigen negative. -Chest x-ray 10/14 shows resolving multilobar bilateral pneumonia, still cardiomegaly with pulmonary venous congestion.  Continue IV Lasix, renal function is stable  Active Problems Acute on chronic diastolic CHF likely exacerbated by hypertensive emergency -Multiple antihypertensives.  On IV diuretics as well.  Fortunately he was weaned off to room air  Hypertensive emergency-Initially received nitro drip.  Off nitro drip at the time and has been initiated on hydralazine amlodipine clonidine patch.  Increase clonidine as well as Coreg dose due to persistently elevated blood pressure.  No renal artery stenosis noted on the ultrasound.  -Blood pressure overall better   Hypokalemia-Improved after replacement.  Continue  to monitor.   Possible AKI on CKD IIIa -Serum creatinine was 2.2 on admission.  Creatinine was 1.6 in August 2021. Renal duplex was negative for hemodynamically significant stenosis.  Nephrology has been consulted for follow-up.  Creatinine overall stable today, continue diuresis as chest x-ray still shows some vascular congestion  History of seizures  -No reported seizures in 1 year.  Continue Keppra from home.   Type II DM -Latest hemoglobin A1c was 6.5% in August 2021.  Hemoglobin A1c from 01/12/2021 at 7.1.  On sliding scale insulin.   Patient is on glipizide and Lantus at home.  Lantus dose is uncertain at this time so patient was started on 5 units at nighttime.   History of CVA  -Continue statins and aspirin.   Elevated troponin -Likely demand ischemia.  2D echocardiogram with preserved LV function.  Patient had accelerated hypertension, respiratory failure pneumonia on presentation.  Scheduled Meds:  amLODipine  10 mg Oral Daily   aspirin EC  81 mg Oral Daily   carvedilol  25 mg Oral BID WC   chlorhexidine  15 mL Mouth Rinse BID   Chlorhexidine Gluconate Cloth  6 each Topical Daily   cloNIDine  0.2 mg Transdermal Weekly   furosemide  40 mg Intravenous BID   heparin  5,000 Units Subcutaneous Q8H   hydrALAZINE  100 mg Oral TID   insulin aspart  0-5 Units Subcutaneous QHS   insulin aspart  0-9 Units Subcutaneous TID WC   insulin glargine-yfgn  5 Units Subcutaneous QHS   isosorbide mononitrate  30 mg Oral Daily   levETIRAcetam  1,000 mg Oral BID   pantoprazole  40 mg Oral Daily   Continuous Infusions:  sodium chloride Stopped (01/16/21  1557)   PRN Meds:.sodium chloride, acetaminophen, albuterol, guaiFENesin-dextromethorphan, hydrALAZINE, labetalol, LORazepam, ondansetron (ZOFRAN) IV  Diet Orders (From admission, onward)     Start     Ordered   01/12/21 0553  Diet heart healthy/carb modified Room service appropriate? Yes; Fluid consistency: Thin  Diet effective now        Question Answer Comment  Diet-HS Snack? Nothing   Room service appropriate? Yes   Fluid consistency: Thin      01/12/21 0604            DVT prophylaxis: heparin injection 5,000 Units Start: 01/12/21 0615     Code Status: Full Code  Family Communication: No family at bedside  Status is: Inpatient  Remains inpatient appropriate because:Inpatient level of care appropriate due to severity of illness  Dispo: The patient is from: Home              Anticipated d/c is to: Home              Patient currently is not medically stable to d/c.   Difficult to place patient No   Level of care: Progressive  Consultants:  Nephrology   Procedures:  2D echo  Microbiology  none  Antimicrobials: none    Objective: Vitals:   01/17/21 1939 01/17/21 2339 01/18/21 0459 01/18/21 1123  BP: (!) 151/84 (!) 158/99 (!) 178/100 (!) 150/78  Pulse: 75 64 71 71  Resp:    20  Temp: 98 F (36.7 C)  98.4 F (36.9 C) 98.3 F (36.8 C)  TempSrc: Oral  Oral Oral  SpO2: 96% 97% 97% 97%  Weight:      Height:        Intake/Output Summary (Last 24 hours) at 01/18/2021 1316 Last data filed at 01/18/2021 1100 Gross per 24 hour  Intake 640 ml  Output 1400 ml  Net -760 ml    Filed Weights   01/15/21 0500 01/16/21 0527 01/17/21 0400  Weight: 111.6 kg 113.2 kg 113.6 kg    Examination:  Constitutional: No distress, eating Eyes: Anicteric sclera ENMT: mmm Neck: normal, supple Respiratory: Clear bilaterally, no wheezing, no crackles, normal respiratory effort Cardiovascular: Regular rate and rhythm, no murmurs, 1+ edema Abdomen: Soft, NT, ND, bowel sounds positive Musculoskeletal: no clubbing / cyanosis.  Skin: No rashes appreciated Neurologic: No focal deficits   Data Reviewed: I have independently reviewed following labs and imaging studies   CBC: Recent Labs  Lab 01/12/21 0202 01/13/21 0309 01/14/21 0319 01/15/21 0255 01/16/21 0259 01/17/21 0259 01/18/21 0347  WBC  8.4   < > 10.7* 9.4 7.3 6.3 6.3  NEUTROABS 7.0  --   --   --   --   --   --   HGB 14.4   < > 11.2* 11.2* 11.4* 11.5* 11.1*  HCT 46.3   < > 35.5* 37.2* 37.2* 38.0* 37.1*  MCV 80.8   < > 81.1 82.5 83.0 83.7 83.6  PLT 288   < > 300 340 438* 523* 546*   < > = values in this interval not displayed.    Basic Metabolic Panel: Recent Labs  Lab 01/13/21 0309 01/14/21 0319 01/15/21 0255 01/16/21 0259 01/17/21 0259 01/18/21 0347  NA 137 140 134* 135 139 135  K 3.3* 3.1* 4.1 4.0 3.7 3.3*  CL 100 103 99 100 103 100  CO2 25 27 26 25 24 24   GLUCOSE 123* 135* 114* 119* 116* 105*  BUN 33* 40* 45* 43* 44* 39*  CREATININE 2.65* 2.92*  2.90* 2.87* 2.38* 2.29*  CALCIUM 8.4* 8.5* 8.6* 8.6* 8.9 8.7*  MG 1.7 1.9 2.2 2.0 2.1 1.9  PHOS 3.6  --   --   --   --   --     Liver Function Tests: Recent Labs  Lab 01/12/21 0202  AST 43*  ALT 23  ALKPHOS 52  BILITOT 1.3*  PROT 7.4  ALBUMIN 3.3*    Coagulation Profile: No results for input(s): INR, PROTIME in the last 168 hours. HbA1C: No results for input(s): HGBA1C in the last 72 hours. CBG: Recent Labs  Lab 01/17/21 2101 01/17/21 2335 01/18/21 0457 01/18/21 0726 01/18/21 1121  GLUCAP 131* 152* 91 113* 147*     Recent Results (from the past 240 hour(s))  Resp Panel by RT-PCR (Flu A&B, Covid) Nasopharyngeal Swab     Status: None   Collection Time: 01/12/21  2:17 AM   Specimen: Nasopharyngeal Swab; Nasopharyngeal(NP) swabs in vial transport medium  Result Value Ref Range Status   SARS Coronavirus 2 by RT PCR NEGATIVE NEGATIVE Final    Comment: (NOTE) SARS-CoV-2 target nucleic acids are NOT DETECTED.  The SARS-CoV-2 RNA is generally detectable in upper respiratory specimens during the acute phase of infection. The lowest concentration of SARS-CoV-2 viral copies this assay can detect is 138 copies/mL. A negative result does not preclude SARS-Cov-2 infection and should not be used as the sole basis for treatment or other patient  management decisions. A negative result may occur with  improper specimen collection/handling, submission of specimen other than nasopharyngeal swab, presence of viral mutation(s) within the areas targeted by this assay, and inadequate number of viral copies(<138 copies/mL). A negative result must be combined with clinical observations, patient history, and epidemiological information. The expected result is Negative.  Fact Sheet for Patients:  BloggerCourse.com  Fact Sheet for Healthcare Providers:  SeriousBroker.it  This test is no t yet approved or cleared by the [place] FDA and  has been authorized for detection and/or diagnosis of SARS-CoV-2 by FDA under an Emergency Use Authorization (EUA). This EUA will remain  in effect (meaning this test can be used) for the duration of the COVID-19 declaration under Section 564(b)(1) of the Act, 21 U.S.C.section 360bbb-3(b)(1), unless the authorization is terminated  or revoked sooner.       Influenza A by PCR NEGATIVE NEGATIVE Final   Influenza B by PCR NEGATIVE NEGATIVE Final    Comment: (NOTE) The Xpert Xpress SARS-CoV-2/FLU/RSV plus assay is intended as an aid in the diagnosis of influenza from Nasopharyngeal swab specimens and should not be used as a sole basis for treatment. Nasal washings and aspirates are unacceptable for Xpert Xpress SARS-CoV-2/FLU/RSV testing.  Fact Sheet for Patients: BloggerCourse.com  Fact Sheet for Healthcare Providers: SeriousBroker.it  This test is not yet approved or cleared by the [place] FDA and has been authorized for detection and/or diagnosis of SARS-CoV-2 by FDA under an Emergency Use Authorization (EUA). This EUA will remain in effect (meaning this test can be used) for the duration of the COVID-19 declaration under Section 564(b)(1) of the Act, 21 U.S.C. section 360bbb-3(b)(1),  unless the authorization is terminated or revoked.  Performed at [facility], [address], [place], [place] [address]   Culture, blood (Routine X 2) w Reflex to ID Panel     Status: None   Collection Time: 01/12/21  6:29 AM   Specimen: BLOOD  Result Value Ref Range Status   Specimen Description   Final  BLOOD BLOOD RIGHT HAND Performed at [facility], [address] [place], [place] [address]    Special Requests   Final    BOTTLES DRAWN AEROBIC AND ANAEROBIC Blood Culture adequate volume Performed at [facility], [address], [place], [place] [address]    Culture   Final    NO GROWTH 5 DAYS Performed at [facility], [address], [place], [place] [address]    Report Status 01/17/2021 FINAL  Final  Culture, blood (Routine X 2) w Reflex to ID Panel     Status: None   Collection Time: 01/12/21  6:29 AM   Specimen: BLOOD  Result Value Ref Range Status   Specimen Description   Final    BLOOD BLOOD LEFT FOREARM Performed at [facility], [address], [place], [place] [address]    Special Requests   Final    BOTTLES DRAWN AEROBIC AND ANAEROBIC Blood Culture adequate volume Performed at [facility], [address] [place], [place] [address]    Culture   Final    NO GROWTH 5 DAYS Performed at [facility], [address], [place], [place] [address]    Report Status 01/17/2021 FINAL  Final  MRSA Next Gen by PCR, Nasal     Status: None   Collection Time: 01/12/21  8:41 AM  Result Value Ref Range Status   MRSA by PCR Next Gen NOT DETECTED NOT DETECTED Final    Comment: (NOTE) The GeneXpert MRSA Assay (FDA approved for NASAL specimens only), is one component of a comprehensive MRSA colonization surveillance program. It is not intended to diagnose MRSA infection nor to guide or monitor treatment for MRSA infections. Test performance is not FDA approved in  patients less than 43 years old. Performed at [facility], [address], [place], [place] [address]       Radiology Studies: DG CHEST PORT 1 VIEW  Result Date: 01/18/2021 CLINICAL DATA:  40 year old male with history of dyspnea. EXAM: PORTABLE CHEST 1 VIEW COMPARISON:  Chest x-ray 01/14/2021. FINDINGS: Lung volumes are normal. No consolidative airspace disease. No pleural effusions. No suspicious appearing pulmonary nodules or masses are noted. No pneumothorax. Cephalization of the pulmonary vasculature, without frank pulmonary edema. Heart size is moderately enlarged. Upper mediastinal contours are within normal limits. IMPRESSION: 1. Resolving multilobar bilateral pneumonia. 2. Cardiomegaly with pulmonary venous congestion, but no frank pulmonary edema. Electronically Signed   By: Trudie Reed M.D.   On: 01/18/2021 12:30     Pamella Pert, MD, PhD [facility]  Between 7 am - 7 pm I am available, please contact me via Amion (for emergencies) or Securechat (non urgent messages)  Between 7 pm - 7 am I am not available, please contact night coverage MD/APP via Amion

## 2021-01-18 NOTE — Progress Notes (Signed)
[facility] Progress Note    Assessment/ Plan:   AKI on CKD3: -last known Cr was 1.6 in 11/2019. Cr currently stable today 2.9, remains non-oliguric. Unclear at this junction if he has had some progression of disease. 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 AKI likely related to HTN emergency and infection. Cr better today, down to 2.4, remains nonoliguric. -Renal artery duplex today-no RAS -UA w/ microscopy significant for hyaline casts. Historically, has proteinuria--will quantify -currently on lasix 40mg  IV BID. If Cr worsens, would recommend holding this. -Continue to monitor daily Cr, Dose meds for GFR<15 -Monitor Daily I/Os, Daily weight  -Maintain MAP>65 for optimal renal perfusion.  -Avoid further nephrotoxins including NSAIDS, Morphine.  Unless absolutely necessary, avoid CT with contrast and/or MRI with gadolinium.     - Fortunately renal function stabilizing; likely has had some progression since 11/2019 but only time will tell. Will sign off at this time. Please have patient f/u with Dr. 12/2019 with [facility] [phone] in 6 weeks.   AHRF secondary to multifocal pneumonia, possibly dCHF related as well -despite lasix, cxr is unchanged. Abx per primary service, s/p azithromycin and rocephin - Check daily standing weights and strict I/O -repeat CXR?   Hypertension emergency: -Currently on amlodipine, Coreg, clonidine patch, Lasix IV, hydralazine, Imdur. BP is better as compared to admit. 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 If results are abnormal, will need MRI abd w and wo contrast to rule out an adenoma at the very least -If needed, would recommend adding spironolactone (can replace amlodipine with this and titrate accordingly)   Anemia  due to chronic disease: -Transfuse for Hgb<7 g/dL. Hgb currently acceptable/stable   Diabetes Mellitus Type 2 -per primary    Subjective:   No acute events. Breathing improving, still has swelling but also better.  No other complaints. [place]    Objective:   BP (!) 188/107 (BP Location: Left Arm) Comment: just got done walking  Pulse 76   Temp 97.8 F (36.6 C) (Axillary)   Resp 20   Ht 5\' 8"  (1.727 m)   Wt 113.6 kg   SpO2 95%   BMI 38.08 kg/m   Intake/Output Summary (Last 24 hours) at 01/18/2021 2010 Last data filed at 01/18/2021 1700 Gross per 24 hour  Intake 960 ml  Output 1750 ml  Net -790 ml   Weight change:   Physical Exam: Gen:nad CVS:rrr Resp:poor air exchange b/l, on RA 01/20/2021, soft Ext:2+ pitting edema b/l LE's Neuro: awake, alert  Imaging: DG CHEST PORT 1 VIEW  Result Date: 01/18/2021 CLINICAL DATA:  40 year old male with history of dyspnea. EXAM: PORTABLE CHEST 1 VIEW COMPARISON:  Chest x-ray 01/14/2021. FINDINGS: Lung volumes are normal. No consolidative airspace disease. No pleural effusions. No suspicious appearing pulmonary nodules or masses are noted. No pneumothorax. Cephalization of the pulmonary vasculature, without frank pulmonary edema. Heart size is moderately enlarged. Upper mediastinal contours are within normal limits. IMPRESSION: 1. Resolving multilobar bilateral pneumonia. 2. Cardiomegaly with pulmonary venous congestion, but no frank pulmonary edema. Electronically Signed   By: 43 M.D.   On: 01/18/2021 12:30    Labs: BMET Recent Labs  Lab 01/12/21 1429 01/13/21 0309 01/14/21 0319 01/15/21 0255 01/16/21 0259 01/17/21 0259 01/18/21 0347  NA 132* 137 140 134* 135  139 135  K 3.2* 3.3* 3.1* 4.1 4.0 3.7 3.3*  CL 96* 100 103 99 100 103 100  CO2 25 25 27 26 25 24 24   GLUCOSE 161* 123* 135* 114* 119* 116* 105*  BUN 28* 33* 40* 45* 43* 44* 39*  CREATININE 2.41* 2.65* 2.92* 2.90* 2.87* 2.38* 2.29*  CALCIUM 7.8* 8.4*  8.5* 8.6* 8.6* 8.9 8.7*  PHOS  --  3.6  --   --   --   --   --    CBC Recent Labs  Lab 01/12/21 0202 01/13/21 0309 01/15/21 0255 01/16/21 0259 01/17/21 0259 01/18/21 0347  WBC 8.4   < > 9.4 7.3 6.3 6.3  NEUTROABS 7.0  --   --   --   --   --   HGB 14.4   < > 11.2* 11.4* 11.5* 11.1*  HCT 46.3   < > 37.2* 37.2* 38.0* 37.1*  MCV 80.8   < > 82.5 83.0 83.7 83.6  PLT 288   < > 340 438* 523* 546*   < > = values in this interval not displayed.    Medications:     amLODipine  10 mg Oral Daily   aspirin EC  81 mg Oral Daily   carvedilol  25 mg Oral BID WC   chlorhexidine  15 mL Mouth Rinse BID   Chlorhexidine Gluconate Cloth  6 each Topical Daily   cloNIDine  0.2 mg Transdermal Weekly   furosemide  40 mg Intravenous BID   heparin  5,000 Units Subcutaneous Q8H   hydrALAZINE  100 mg Oral TID   insulin aspart  0-5 Units Subcutaneous QHS   insulin aspart  0-9 Units Subcutaneous TID WC   insulin glargine-yfgn  5 Units Subcutaneous QHS   isosorbide mononitrate  30 mg Oral Daily   levETIRAcetam  1,000 mg Oral BID   pantoprazole  40 mg Oral Daily

## 2021-01-19 LAB — BASIC METABOLIC PANEL
Anion gap: 10 (ref 5–15)
BUN: 32 mg/dL — ABNORMAL HIGH (ref 6–20)
CO2: 29 mmol/L (ref 22–32)
Calcium: 9.1 mg/dL (ref 8.9–10.3)
Chloride: 101 mmol/L (ref 98–111)
Creatinine, Ser: 2.28 mg/dL — ABNORMAL HIGH (ref 0.61–1.24)
GFR, Estimated: 36 mL/min — ABNORMAL LOW (ref >60)
Glucose, Bld: 100 mg/dL — ABNORMAL HIGH (ref 70–99)
Potassium: 3.5 mmol/L (ref 3.5–5.1)
Sodium: 140 mmol/L (ref 135–145)

## 2021-01-19 LAB — CBC
HCT: 37.7 % — ABNORMAL LOW (ref 39.0–52.0)
Hemoglobin: 11.6 g/dL — ABNORMAL LOW (ref 13.0–17.0)
MCH: 25.2 pg — ABNORMAL LOW (ref 26.0–34.0)
MCHC: 30.8 g/dL (ref 30.0–36.0)
MCV: 81.8 fL (ref 80.0–100.0)
Platelets: 625 10*3/uL — ABNORMAL HIGH (ref 150–400)
RBC: 4.61 MIL/uL (ref 4.22–5.81)
RDW: 16.8 % — ABNORMAL HIGH (ref 11.5–15.5)
WBC: 5.7 10*3/uL (ref 4.0–10.5)
nRBC: 0 % (ref 0.0–0.2)

## 2021-01-19 LAB — GLUCOSE, CAPILLARY
Glucose-Capillary: 103 mg/dL — ABNORMAL HIGH (ref 70–99)
Glucose-Capillary: 143 mg/dL — ABNORMAL HIGH (ref 70–99)

## 2021-01-19 LAB — MAGNESIUM: Magnesium: 1.9 mg/dL (ref 1.7–2.4)

## 2021-01-19 MED ORDER — GLIPIZIDE ER 10 MG PO TB24: 10.0000 mg | ORAL_TABLET | Freq: Every day | ORAL | 0 refills | Status: DC

## 2021-01-19 MED ORDER — FUROSEMIDE 40 MG PO TABS: 40.0000 mg | ORAL_TABLET | Freq: Every day | ORAL | 0 refills | Status: DC

## 2021-01-19 MED ORDER — ATORVASTATIN CALCIUM 10 MG PO TABS: 10.0000 mg | ORAL_TABLET | Freq: Every day | ORAL | 0 refills | Status: DC

## 2021-01-19 MED ORDER — AMLODIPINE BESYLATE 10 MG PO TABS: ORAL_TABLET | Freq: Every day | ORAL | 0 refills | Status: DC

## 2021-01-19 MED ORDER — HYDRALAZINE HCL 100 MG PO TABS
ORAL_TABLET | Freq: Three times a day (TID) | ORAL | 0 refills | Status: DC
Start: 2021-01-19 — End: 2021-02-21

## 2021-01-19 MED ORDER — CARVEDILOL 25 MG PO TABS: 25.0000 mg | ORAL_TABLET | Freq: Two times a day (BID) | ORAL | 0 refills | Status: DC

## 2021-01-19 MED ORDER — ISOSORBIDE MONONITRATE ER 60 MG PO TB24
60.0000 mg | ORAL_TABLET | Freq: Every day | ORAL | Status: DC
  Administered 2021-01-19: 60 mg via ORAL
  Filled 2021-01-19: qty 1

## 2021-01-19 MED ORDER — ISOSORBIDE MONONITRATE ER 60 MG PO TB24: 60.0000 mg | ORAL_TABLET | Freq: Every day | ORAL | 0 refills | Status: DC

## 2021-01-19 MED ORDER — CLONIDINE 0.3 MG/24HR TD PTWK: 0.3000 mg | MEDICATED_PATCH | TRANSDERMAL | 0 refills | Status: DC

## 2021-01-19 MED ORDER — CLONIDINE HCL 0.3 MG/24HR TD PTWK
0.3000 mg | MEDICATED_PATCH | TRANSDERMAL | Status: DC
  Administered 2021-01-19: 0.3 mg via TRANSDERMAL
  Filled 2021-01-19: qty 1

## 2021-01-19 MED ORDER — LANTUS SOLOSTAR 100 UNIT/ML ~~LOC~~ SOPN: 5.0000 [IU] | PEN_INJECTOR | Freq: Every day | SUBCUTANEOUS | 0 refills | Status: DC

## 2021-01-19 NOTE — Plan of Care (Signed)
Discharge instructions provided to patient.  Patient verbalized understanding.  PIV removed, cardiac monitoring discontinued.  New medication prescriptions provided to patient in print form.  Patient escorted to main entrance via wheelchair for transport home with wife.  Bradd Burner, RN

## 2021-01-19 NOTE — Progress Notes (Signed)
Patient's BP 161/105 after orders for discharge in place.  MD notified.  PRN hydralazine administered.  Bradd Burner, RN

## 2021-01-19 NOTE — Discharge Summary (Addendum)
Physician Discharge Summary  Micheal Levy VFI:[id number] DOB: [birth year] DOA: 01/12/2021  PCP: Barbette Merino, NP  Admit date: 01/12/2021 Discharge date: 01/19/2021  Admitted From: home Disposition:  home  Recommendations for Outpatient Follow-up:  Follow up with PCP in 1 week Obtain a BMP in 1 week  Home Health: none Equipment/Devices: none  Discharge Condition: stable CODE STATUS: Full code Diet recommendation: low sodium, heart healthy  HPI: Per admitting MD, Micheal Levy is a 40 y.o. male with medical history significant for chronic renal insufficiency, type 2 diabetes mellitus, severe hypertension, seizure, history of CVA, substance abuse in remission, now presenting to the emergency department with shortness of breath.  The patient reports 2 days of shortness of breath and cough productive of thick white sputum initially and now brownish sputum.  He denies any fevers, chills, chest pain, or increase in mild chronic leg swelling.  He denies night sweats, chronic cough, hemoptysis, or travel.  He was incarcerated several years ago and has lost some weight recently but reports that this is a result of exercise and dietary changes.  He denies any recent alcohol or substance use.  Hospital Course / Discharge diagnoses: Principal Problem Acute hypoxic respiratory failure secondary to multifocal pneumonia and heart failure, possible underlying obstructive sleep apnea -patient was initially admitted to stepdown requiring BiPAP for hypoxic respiratory failure.  He was placed on antibiotics and completed a course of ceftriaxone and azithromycin in the hospital.  Strep pneumonia and Legionella antigens were negative.  He was also placed on IV diuresis.  Repeat chest x-ray on 10/14 shows improvement, and he was eventually being able to be weaned off to room air, now ambulating in the hallway without any supplemental oxygen, difficulties and feels back to baseline.  He is diuretics were  changed to p.o. and will be discharged home in stable conditio   Active Problems Acute on chronic diastolic CHF likely exacerbated by hypertensive emergency -continue multiple antihypertensives as below.  His blood pressure still on the high side but much more stable and controlled compared to admission.  I recommend further follow-up as an outpatient for ambulatory BP monitoring and adjustments as necessary  Hypertensive emergency-Initially received nitro drip.  Off nitro drip at the time and has been initiated on hydralazine, amlodipine, clonidine patch, Imdur along with Lasix. No renal artery stenosis noted on the ultrasound.  Hypokalemia-Improved after replacement.  Continue to monitor. Possible AKI on CKD IIIa -Serum creatinine was 2.2 on admission.  Creatinine was 1.6 in August 2021. Renal duplex was negative for hemodynamically significant stenosis.  Nephrology has been consulted for follow-up.  Creatinine overall stable today, his creatinine on discharge is around 2.2, stable despite diuresis.  This may be his new baseline.  Recommend blood work next week as an outpatient.  History of seizures  -No reported seizures in 1 year.  Continue Keppra from home. Type II DM -Latest hemoglobin A1c was 6.5% in August 2021.  Hemoglobin A1c from 01/12/2021 at 7.1.  Lantus dose has been decreased.  Metformin on hold due to worsening renal function  History of CVA  -Continue statins and aspirin. Elevated troponin -Likely demand ischemia.  2D echocardiogram with preserved LV function.  Patient had accelerated hypertension, respiratory failure pneumonia on presentation.  Sepsis ruled out   Discharge Instructions   Allergies as of 01/19/2021   No Known Allergies      Medication List     STOP taking these medications    cloNIDine 0.1 mg/24hr patch  Commonly known as: CATAPRES - Dosed in mg/24 hr Replaced by: cloNIDine 0.3 mg/24hr patch   metFORMIN 1000 MG tablet Commonly known as: GLUCOPHAGE    metoprolol tartrate 50 MG tablet Commonly known as: LOPRESSOR   pantoprazole 40 MG tablet Commonly known as: PROTONIX   polyethylene glycol 17 g packet Commonly known as: MIRALAX / GLYCOLAX       TAKE these medications    albuterol 108 (90 Base) MCG/ACT inhaler Commonly known as: VENTOLIN HFA Inhale 2 puffs into the lungs every 6 (six) hours as needed for wheezing or shortness of breath.   albuterol (2.5 MG/3ML) 0.083% nebulizer solution Commonly known as: PROVENTIL Take 3 mLs (2.5 mg total) by nebulization every 6 (six) hours as needed for wheezing or shortness of breath.   amLODipine 10 MG tablet Commonly known as: NORVASC TAKE 1 TABLET (10 MG TOTAL) BY MOUTH DAILY.   aspirin EC 81 MG tablet Take 1 tablet (81 mg total) by mouth daily.   atorvastatin 10 MG tablet Commonly known as: LIPITOR Take 1 tablet (10 mg total) by mouth daily.   carvedilol 25 MG tablet Commonly known as: COREG Take 1 tablet (25 mg total) by mouth 2 (two) times daily with a meal.   cloNIDine 0.3 mg/24hr patch Commonly known as: CATAPRES - Dosed in mg/24 hr Place 1 patch (0.3 mg total) onto the skin once a week. Start taking on: January 26, 2021 Replaces: cloNIDine 0.1 mg/24hr patch   feeding supplement (GLUCERNA SHAKE) Liqd Take 237 mLs by mouth 3 (three) times daily between meals.   furosemide 40 MG tablet Commonly known as: Lasix Take 1 tablet (40 mg total) by mouth daily.   glipiZIDE 10 MG 24 hr tablet Commonly known as: Glucotrol XL Take 1 tablet (10 mg total) by mouth daily with breakfast.   hydrALAZINE 100 MG tablet Commonly known as: APRESOLINE TAKE 1 TABLET (100 MG TOTAL) BY MOUTH 3 (THREE) TIMES DAILY.   INSULIN SYRINGE .5CC/29G 29G X 1/2" 0.5 ML Misc Commonly known as: B-D INS SYR ULTRAFINE .5CC/29G 1 each by Does not apply route at bedtime.   isosorbide mononitrate 60 MG 24 hr tablet Commonly known as: IMDUR Take 1 tablet (60 mg total) by mouth daily. What changed:   medication strength how much to take   Lancets Misc 1 each by Does not apply route 4 (four) times daily -  before meals and at bedtime.   Lantus SoloStar 100 UNIT/ML Solostar Pen Generic drug: insulin glargine Inject 5 Units into the skin daily at 10 pm. What changed: how much to take   levETIRAcetam 1000 MG tablet Commonly known as: KEPPRA TAKE 1 TABLET (1,000 MG TOTAL) BY MOUTH 2 (TWO) TIMES DAILY. What changed: how much to take   MENS ONE DAILY PO Take 1 tablet by mouth daily.   Pen Needles 31G X 6 MM Misc 1 each by Does not apply route at bedtime.   PRESCRIPTION MEDICATION Take 1,000 mg by mouth in the morning and at bedtime. Blood pressure medication. Patient unsure of name but says it's 1000mg  bid.   True Metrix Air Glucose Meter Devi 1 each by Does not apply route 4 (four) times daily -  before meals and at bedtime.   True Metrix Blood Glucose Test test strip Generic drug: glucose blood Use as instructed What changed:  how much to take how to take this when to take this additional instructions   Vitamin D (Ergocalciferol) 1.25 MG (50000 UNIT) Caps capsule Commonly known  as: DRISDOL Take 1 capsule (50,000 Units total) by mouth every 7 (seven) days.        Consultations: Nephrology   Procedures/Studies:  DG Chest 2 View  Result Date: 01/12/2021 CLINICAL DATA:  Shortness of breath starting yesterday. EXAM: CHEST - 2 VIEW COMPARISON:  09/08/2019 FINDINGS: There is a diffuse nodular parenchymal pattern to both lungs consistent with multiple pulmonary nodules. This appears to be progressing since the previous study. Differential diagnosis would include multifocal pneumonia, atypical pneumonia, septic emboli, or developing metastatic disease. CT suggested for further evaluation. No pleural effusions. No pneumothorax. Mediastinal contours appear intact. Cardiac enlargement. IMPRESSION: Diffuse nodular parenchymal pattern to the lungs suggesting multifocal  pneumonia versus developing metastatic disease. CT suggested. Electronically Signed   By: Burman Nieves M.D.   On: 01/12/2021 02:00   CT Chest Wo Contrast  Result Date: 01/12/2021 CLINICAL DATA:  Chest pain or shortness of breath. Pleurisy or effusion suspected. Abnormal chest radiograph. EXAM: CT CHEST WITHOUT CONTRAST TECHNIQUE: Multidetector CT imaging of the chest was performed following the standard protocol without IV contrast. COMPARISON:  Chest radiograph 01/12/2021 FINDINGS: Cardiovascular: Diffuse cardiac enlargement. No pericardial effusions. Normal caliber thoracic aorta. Mediastinum/Nodes: Esophagus is decompressed. Thyroid gland is unremarkable. Scattered lymph nodes in the mediastinum are not pathologically enlarged, likely reactive. Lungs/Pleura: Motion artifact limits examination. Diffuse pattern of focal and confluent nodular opacities throughout the lungs, most prominent in the central and upper lung distribution. Some air bronchograms are present within the larger areas of involvement. Changes are most likely to represent multifocal pneumonia. This could represent bacterial, viral, or TB/fungal varieties. Short-term follow-up in 3 months is recommended to exclude metastatic disease. No pleural effusions. No pneumothorax. Upper Abdomen: No acute abnormalities identified. Musculoskeletal: No chest wall mass or suspicious bone lesions identified. IMPRESSION: Nodular and confluent infiltrates throughout both lungs most likely infectious or inflammatory. Consider multifocal bacterial pneumonia, COVID pneumonia, or TB/fungal disease. Short-term follow-up CT in 3 months is recommended to exclude metastatic disease. Electronically Signed   By: Burman Nieves M.D.   On: 01/12/2021 03:32   DG CHEST PORT 1 VIEW  Result Date: 01/18/2021 CLINICAL DATA:  40 year old male with history of dyspnea. EXAM: PORTABLE CHEST 1 VIEW COMPARISON:  Chest x-ray 01/14/2021. FINDINGS: Lung volumes are normal. No  consolidative airspace disease. No pleural effusions. No suspicious appearing pulmonary nodules or masses are noted. No pneumothorax. Cephalization of the pulmonary vasculature, without frank pulmonary edema. Heart size is moderately enlarged. Upper mediastinal contours are within normal limits. IMPRESSION: 1. Resolving multilobar bilateral pneumonia. 2. Cardiomegaly with pulmonary venous congestion, but no frank pulmonary edema. Electronically Signed   By: Trudie Reed M.D.   On: 01/18/2021 12:30   DG CHEST PORT 1 VIEW  Result Date: 01/14/2021 CLINICAL DATA:  Pneumonia, follow-up EXAM: PORTABLE CHEST 1 VIEW COMPARISON:  01/12/2021 FINDINGS: Persistent bilateral pulmonary opacities. No pleural effusion. Stable cardiomegaly. IMPRESSION: Persistent pneumonia without substantial change. Electronically Signed   By: Guadlupe Spanish M.D.   On: 01/14/2021 08:40   ECHOCARDIOGRAM COMPLETE  Result Date: 01/12/2021    ECHOCARDIOGRAM REPORT   Patient Name:   Micheal Levy Date of Exam: 01/12/2021 Medical Rec #:  [id number]       Height:       69.0 in Accession #:    [id number]      Weight:       230.0 lb Date of Birth:  05-14-1980       BSA:  2.192 m Patient Age:    40 years        BP:           171/132 mmHg Patient Gender: M               HR:           101 bpm. Exam Location:  Inpatient Procedure: 2D Echo, Cardiac Doppler, Color Doppler and Intracardiac            Opacification Agent Indications:    CHF-Acute Diastolic I50.31                 Elevated Troponin  History:        Patient has prior history of Echocardiogram examinations, most                 recent 09/04/2019. Stroke, Signs/Symptoms:Shortness of Breath;                 Risk Factors:Hypertension and Diabetes. Chronic renal                 insufficiency. Chronic Kidney Disease. Tachypneic, tachycardic,                 very hypertensive. Multifocal pneumonia; acute hypoxic                 respiratory failure. Acute on chronic diastolic CHF.   Sonographer:    Leta Jungling RDCS Referring Phys: [id number] TIMOTHY S OPYD  Sonographer Comments: Images hard to obtain due to dyspnea. Images obtained with the patient upright for comfort. IMPRESSIONS  1. Left ventricular ejection fraction, by estimation, is 55 to 60%. The left ventricle has normal function. The left ventricle has no regional wall motion abnormalities. There is moderate concentric left ventricular hypertrophy. Left ventricular diastolic parameters are consistent with Grade III diastolic dysfunction (restrictive). Elevated left atrial pressure.  2. Right ventricular systolic function was not well visualized. The right ventricular size is not well visualized. There is moderately elevated pulmonary artery systolic pressure. The estimated right ventricular systolic pressure is 56.0 mmHg.  3. Left atrial size was mild to moderately dilated.  4. The mitral valve is normal in structure. No evidence of mitral valve regurgitation.  5. The aortic valve is tricuspid. Aortic valve regurgitation is not visualized. No aortic stenosis is present.  6. The inferior vena cava is dilated in size with <50% respiratory variability, suggesting right atrial pressure of 15 mmHg. FINDINGS  Left Ventricle: Left ventricular ejection fraction, by estimation, is 55 to 60%. The left ventricle has normal function. The left ventricle has no regional wall motion abnormalities. Definity contrast agent was given IV to delineate the left ventricular  endocardial borders. The left ventricular internal cavity size was normal in size. There is moderate concentric left ventricular hypertrophy. Left ventricular diastolic parameters are consistent with Grade III diastolic dysfunction (restrictive). Elevated left atrial pressure. Right Ventricle: The right ventricular size is not well visualized. Right vetricular wall thickness was not well visualized. Right ventricular systolic function was not well visualized. There is moderately elevated  pulmonary artery systolic pressure. The  tricuspid regurgitant velocity is 3.20 m/s, and with an assumed right atrial pressure of 15 mmHg, the estimated right ventricular systolic pressure is 56.0 mmHg. Left Atrium: Left atrial size was mild to moderately dilated. Right Atrium: Right atrial size was not well visualized. Pericardium: There is no evidence of pericardial effusion. Mitral Valve: The mitral valve is normal in structure. No evidence of mitral valve  regurgitation. Tricuspid Valve: The tricuspid valve is normal in structure. Tricuspid valve regurgitation is mild. Aortic Valve: The aortic valve is tricuspid. Aortic valve regurgitation is not visualized. No aortic stenosis is present. Pulmonic Valve: The pulmonic valve was grossly normal. Pulmonic valve regurgitation is not visualized. Aorta: The aortic root and ascending aorta are structurally normal, with no evidence of dilitation. Venous: The inferior vena cava is dilated in size with less than 50% respiratory variability, suggesting right atrial pressure of 15 mmHg. IAS/Shunts: No atrial level shunt detected by color flow Doppler.  LEFT VENTRICLE PLAX 2D LVIDd:         5.60 cm     Diastology LVIDs:         4.60 cm     LV e' medial:    5.66 cm/s LV PW:         1.40 cm     LV E/e' medial:  18.6 LV IVS:        1.50 cm     LV e' lateral:   6.31 cm/s LVOT diam:     2.30 cm     LV E/e' lateral: 16.6 LV SV:         57 LV SV Index:   26 LVOT Area:     4.15 cm  LV Volumes (MOD) LV vol d, MOD A2C: 73.3 ml LV vol d, MOD A4C: 83.9 ml LV vol s, MOD A2C: 32.3 ml LV vol s, MOD A4C: 34.1 ml LV SV MOD A2C:     41.0 ml LV SV MOD A4C:     49.8 ml LV SV MOD BP:      46.9 ml LEFT ATRIUM         Index LA diam:    4.20 cm 1.92 cm/m  AORTIC VALVE LVOT Vmax:   113.00 cm/s LVOT Vmean:  80.900 cm/s LVOT VTI:    0.138 m  AORTA Ao Root diam: 3.30 cm MITRAL VALVE                TRICUSPID VALVE MV Area (PHT): 5.75 cm     TR Peak grad:   41.0 mmHg MV Decel Time: 132 msec     TR Vmax:         320.00 cm/s MV E velocity: 105.00 cm/s MV A velocity: 39.80 cm/s   SHUNTS MV E/A ratio:  2.64         Systemic VTI:  0.14 m                             Systemic Diam: 2.30 cm [name] [name] MD Electronically signed by Thurmon Fair MD Signature Date/Time: 01/12/2021/12:11:16 PM    Final    VAS US RENAL ARTERY DUPLEX  Result Date: 01/15/2021 ABDOMINAL VISCERAL Patient Name:  Micheal Levy  Date of Exam:   01/15/2021 Medical Rec #: [id number]        Accession #:    [id number] Date of Birth: 1981-01-23        Patient Gender: M Patient Age:   61 years Exam Location:  [facility] Procedure:      VAS US RENAL ARTERY DUPLEX Referring Phys: [id number] WHITNEY D HARRIS -------------------------------------------------------------------------------- Indications: Hypertension High Risk Factors: Hypertension, Diabetes. Limitations: Air/bowel gas, obesity and patient respiratory distrubance, patient movement. Comparison Study: No prior studies. Performing Technologist: Chanda Busing RVT  Examination Guidelines: A complete evaluation includes B-mode imaging, spectral Doppler, color Doppler, and power Doppler  as needed of all accessible portions of each vessel. Bilateral testing is considered an integral part of a complete examination. Limited examinations for reoccurring indications may be performed as noted.  Duplex Findings: +--------------------+--------+--------+------+--------+ Mesenteric          PSV cm/sEDV cm/sPlaqueComments +--------------------+--------+--------+------+--------+ Aorta Mid             204      22                  +--------------------+--------+--------+------+--------+ Celiac Artery Origin  109                          +--------------------+--------+--------+------+--------+ SMA Proximal          185      19                  +--------------------+--------+--------+------+--------+    +------------------+--------+--------+-------+ Right Renal ArteryPSV cm/sEDV  cm/sComment +------------------+--------+--------+-------+ Origin               33      8            +------------------+--------+--------+-------+ Proximal             79      14           +------------------+--------+--------+-------+ Mid                  62      15           +------------------+--------+--------+-------+ Distal               46      15           +------------------+--------+--------+-------+ +-----------------+--------+--------+-------+ Left Renal ArteryPSV cm/sEDV cm/sComment +-----------------+--------+--------+-------+ Origin              66      12           +-----------------+--------+--------+-------+ Proximal            66      13           +-----------------+--------+--------+-------+ Mid                 33      10           +-----------------+--------+--------+-------+ Distal              34      8            +-----------------+--------+--------+-------+ +------------+--------+--------+----+-----------+--------+--------+----+ Right KidneyPSV cm/sEDV cm/sRI  Left KidneyPSV cm/sEDV cm/sRI   +------------+--------+--------+----+-----------+--------+--------+----+ Upper Pole  14      6       0.60Upper Pole 11      5       0.52 +------------+--------+--------+----+-----------+--------+--------+----+ Mid         17      5       0.        13      6       0.54 +------------+--------+--------+----+-----------+--------+--------+----+ Lower Pole  14      4       0.69Lower Pole 15      5       0.68 +------------+--------+--------+----+-----------+--------+--------+----+ Hilar       18      6       0.65Hilar      36      6       0.83 +------------+--------+--------+----+-----------+--------+--------+----+ +------------------+-----+------------------+-----+  Right Kidney           Left Kidney             +------------------+-----+------------------+-----+ RAR                    RAR                      +------------------+-----+------------------+-----+ RAR (manual)      0.39 RAR (manual)      0.32  +------------------+-----+------------------+-----+ Cortex                 Cortex                  +------------------+-----+------------------+-----+ Cortex thickness       Corex thickness         +------------------+-----+------------------+-----+ Kidney length (cm)12.50Kidney length (cm)11.50 +------------------+-----+------------------+-----+  Summary: Renal:  Right: Normal size right kidney. Normal right Resisitive Index.        1-59% stenosis of the right renal artery. Left:  Normal size of left kidney. Normal left Resistive Index.        1-59% stenosis of the left renal artery.  *See table(s) above for measurements and observations.  Diagnosing physician: Lemar Livings MD  Electronically signed by Lemar Livings MD on 01/15/2021 at 1:49:53 PM.    Final      Subjective: - no chest pain, shortness of breath, no abdominal pain, nausea or vomiting.   Discharge Exam: BP (!) 161/105 (BP Location: Left Arm)   Pulse 72   Temp 98.4 F (36.9 C) (Oral)   Resp 20   Ht 5\' 9"  (1.753 m)   Wt 108.3 kg   SpO2 97%   BMI 35.26 kg/m   General: Pt is alert, awake, not in acute distress Cardiovascular: RRR, S1/S2 +, no rubs, no gallops Respiratory: CTA bilaterally, no wheezing, no rhonchi Abdominal: Soft, NT, ND, bowel sounds + Extremities: no edema, no cyanosis    The results of significant diagnostics from this hospitalization (including imaging, microbiology, ancillary and laboratory) are listed below for reference.     Microbiology: Recent Results (from the past 240 hour(s))  Resp Panel by RT-PCR (Flu A&B, Covid) Nasopharyngeal Swab     Status: None   Collection Time: 01/12/21  2:17 AM   Specimen: Nasopharyngeal Swab; Nasopharyngeal(NP) swabs in vial transport medium  Result Value Ref Range Status   SARS Coronavirus 2 by RT PCR NEGATIVE NEGATIVE Final    Comment:  (NOTE) SARS-CoV-2 target nucleic acids are NOT DETECTED.  The SARS-CoV-2 RNA is generally detectable in upper respiratory specimens during the acute phase of infection. The lowest concentration of SARS-CoV-2 viral copies this assay can detect is 138 copies/mL. A negative result does not preclude SARS-Cov-2 infection and should not be used as the sole basis for treatment or other patient management decisions. A negative result may occur with  improper specimen collection/handling, submission of specimen other than nasopharyngeal swab, presence of viral mutation(s) within the areas targeted by this assay, and inadequate number of viral copies(<138 copies/mL). A negative result must be combined with clinical observations, patient history, and epidemiological information. The expected result is Negative.  Fact Sheet for Patients:  BloggerCourse.com  Fact Sheet for Healthcare Providers:  SeriousBroker.it  This test is no t yet approved or cleared by the [place] FDA and  has been authorized for detection and/or diagnosis of SARS-CoV-2 by FDA under an Emergency Use Authorization (EUA). This EUA will remain  in effect (  meaning this test can be used) for the duration of the COVID-19 declaration under Section 564(b)(1) of the Act, 21 U.S.C.section 360bbb-3(b)(1), unless the authorization is terminated  or revoked sooner.       Influenza A by PCR NEGATIVE NEGATIVE Final   Influenza B by PCR NEGATIVE NEGATIVE Final    Comment: (NOTE) The Xpert Xpress SARS-CoV-2/FLU/RSV plus assay is intended as an aid in the diagnosis of influenza from Nasopharyngeal swab specimens and should not be used as a sole basis for treatment. Nasal washings and aspirates are unacceptable for Xpert Xpress SARS-CoV-2/FLU/RSV testing.  Fact Sheet for Patients: BloggerCourse.com  Fact Sheet for Healthcare  Providers: SeriousBroker.it  This test is not yet approved or cleared by the [place] FDA and has been authorized for detection and/or diagnosis of SARS-CoV-2 by FDA under an Emergency Use Authorization (EUA). This EUA will remain in effect (meaning this test can be used) for the duration of the COVID-19 declaration under Section 564(b)(1) of the Act, 21 U.S.C. section 360bbb-3(b)(1), unless the authorization is terminated or revoked.  Performed at [facility], [address], [place], [place] [address]   Culture, blood (Routine X 2) w Reflex to ID Panel     Status: None   Collection Time: 01/12/21  6:29 AM   Specimen: BLOOD  Result Value Ref Range Status   Specimen Description   Final    BLOOD BLOOD RIGHT HAND Performed at [facility], [address], [place], [place] [address]    Special Requests   Final    BOTTLES DRAWN AEROBIC AND ANAEROBIC Blood Culture adequate volume Performed at [facility], [address], [place], [place] [address]    Culture   Final    NO GROWTH 5 DAYS Performed at [facility], [address], [place], [place] [address]    Report Status 01/17/2021 FINAL  Final  Culture, blood (Routine X 2) w Reflex to ID Panel     Status: None   Collection Time: 01/12/21  6:29 AM   Specimen: BLOOD  Result Value Ref Range Status   Specimen Description   Final    BLOOD BLOOD LEFT FOREARM Performed at [facility], [address] [place], [place] [address]    Special Requests   Final    BOTTLES DRAWN AEROBIC AND ANAEROBIC Blood Culture adequate volume Performed at [facility], [address], [place], [place] [address]    Culture   Final    NO GROWTH 5 DAYS Performed at [facility], [address] [place], [place] [address]    Report Status 01/17/2021 FINAL  Final  MRSA Next Gen by PCR, Nasal     Status: None    Collection Time: 01/12/21  8:41 AM  Result Value Ref Range Status   MRSA by PCR Next Gen NOT DETECTED NOT DETECTED Final    Comment: (NOTE) The GeneXpert MRSA Assay (FDA approved for NASAL specimens only), is one component of a comprehensive MRSA colonization surveillance program. It is not intended to diagnose MRSA infection nor to guide or monitor treatment for MRSA infections. Test performance is not FDA approved in patients less than 30 years old. Performed at [facility], [address], [place], [place] [address]      Labs: Basic Metabolic Panel: Recent Labs  Lab 01/13/21 0309 01/14/21 0319 01/15/21 0255 01/16/21 0259 01/17/21 0259 01/18/21 0347 01/19/21 0414  NA 137   < >  134* 135 139 135 140  K 3.3*   < > 4.1 4.0 3.7 3.3* 3.5  CL 100   < > 99 100 103 100 101  CO2 25   < > 26 25 24 24 29   GLUCOSE 123*   < > 114* 119* 116* 105* 100*  BUN 33*   < > 45* 43* 44* 39* 32*  CREATININE 2.65*   < > 2.90* 2.87* 2.38* 2.29* 2.28*  CALCIUM 8.4*   < > 8.6* 8.6* 8.9 8.7* 9.1  MG 1.7   < > 2.2 2.0 2.1 1.9 1.9  PHOS 3.6  --   --   --   --   --   --    < > = values in this interval not displayed.   Liver Function Tests: No results for input(s): AST, ALT, ALKPHOS, BILITOT, PROT, ALBUMIN in the last 168 hours. CBC: Recent Labs  Lab 01/15/21 0255 01/16/21 0259 01/17/21 0259 01/18/21 0347 01/19/21 0414  WBC 9.4 7.3 6.3 6.3 5.7  HGB 11.2* 11.4* 11.5* 11.1* 11.6*  HCT 37.2* 37.2* 38.0* 37.1* 37.7*  MCV 82.5 83.0 83.7 83.6 81.8  PLT 340 438* 523* 546* 625*   CBG: Recent Labs  Lab 01/18/21 1121 01/18/21 1616 01/18/21 2041 01/19/21 0728 01/19/21 1138  GLUCAP 147* 142* 139* 103* 143*   Hgb A1c No results for input(s): HGBA1C in the last 72 hours. Lipid Profile No results for input(s): CHOL, HDL, LDLCALC, TRIG, CHOLHDL, LDLDIRECT in the last 72 hours. Thyroid function studies No results for input(s): TSH, T4TOTAL, T3FREE, THYROIDAB in the last 72  hours.  Invalid input(s): FREET3 Urinalysis    Component Value Date/Time   COLORURINE YELLOW 01/15/2021 1839   APPEARANCEUR HAZY (A) 01/15/2021 1839   LABSPEC 1.013 01/15/2021 1839   PHURINE 5.0 01/15/2021 1839   GLUCOSEU NEGATIVE 01/15/2021 1839   HGBUR NEGATIVE 01/15/2021 1839   BILIRUBINUR NEGATIVE 01/15/2021 1839   BILIRUBINUR small 11/11/2019 1206   KETONESUR NEGATIVE 01/15/2021 1839   PROTEINUR 100 (A) 01/15/2021 1839   UROBILINOGEN 0.2 11/11/2019 1206   UROBILINOGEN 0.2 06/04/2017 1058   NITRITE NEGATIVE 01/15/2021 1839   LEUKOCYTESUR NEGATIVE 01/15/2021 1839    FURTHER DISCHARGE INSTRUCTIONS:   Get Medicines reviewed and adjusted: Please take all your medications with you for your next visit with your Primary MD   Laboratory/radiological data: Please request your Primary MD to go over all hospital tests and procedure/radiological results at the follow up, please ask your Primary MD to get all Hospital records sent to his/her office.   In some cases, they will be blood work, cultures and biopsy results pending at the time of your discharge. Please request that your primary care M.D. goes through all the records of your hospital data and follows up on these results.   Also Note the following: If you experience worsening of your admission symptoms, develop shortness of breath, life threatening emergency, suicidal or homicidal thoughts you must seek medical attention immediately by calling 911 or calling your MD immediately  if symptoms less severe.   You must read complete instructions/literature along with all the possible adverse reactions/side effects for all the Medicines you take and that have been prescribed to you. Take any new Medicines after you have completely understood and accpet all the possible adverse reactions/side effects.    Do not drive when taking Pain medications or sleeping medications (Benzodaizepines)   Do not take more than prescribed Pain, Sleep  and Anxiety Medications. It is not advisable  to combine anxiety,sleep and pain medications without talking with your primary care practitioner   Special Instructions: If you have smoked or chewed Tobacco  in the last 2 yrs please stop smoking, stop any regular Alcohol  and or any Recreational drug use.   Wear Seat belts while driving.   Please note: You were cared for by a hospitalist during your hospital stay. Once you are discharged, your primary care physician will handle any further medical issues. Please note that NO REFILLS for any discharge medications will be authorized once you are discharged, as it is imperative that you return to your primary care physician (or establish a relationship with a primary care physician if you do not have one) for your post hospital discharge needs so that they can reassess your need for medications and monitor your lab values.  Time coordinating discharge: 40 minutes  SIGNED:  Pamella Pert, MD, PhD 01/19/2021, 1:43 PM

## 2021-01-19 NOTE — Progress Notes (Signed)
Mobility Specialist - Progress Note    01/19/21 1050  Mobility  Activity Refused mobility   Pt refused mobility at this time and is preparing to d/c.  Arliss Journey Mobility Specialist Acute Rehabilitation Services Phone: [phone] 01/19/21, 10:51 AM

## 2021-01-23 ENCOUNTER — Telehealth: Payer: Self-pay

## 2021-01-23 NOTE — Telephone Encounter (Signed)
Transition Care Management Unsuccessful Follow-up Telephone Call  Date of discharge and from where:  01/19/21 from [facility]  Attempts:  1st Attempt  Reason for unsuccessful TCM follow-up call:  Left voice message   Kaitlynd Phillips, RN, MSN, BSN, CCM Care Management Coordinator [phone]     

## 2021-01-24 ENCOUNTER — Telehealth: Payer: Self-pay

## 2021-01-24 NOTE — Telephone Encounter (Signed)
Transition Care Management Unsuccessful Follow-up Telephone Call  Date of discharge and from where:  01/19/21 from [facility]  Attempts:  2nd Attempt  Reason for unsuccessful TCM follow-up call:  Left voice message   Okechukwu Regnier, RN, MSN, BSN, CCM [facility] Care Management Coordinator [phone]     

## 2021-02-19 ENCOUNTER — Observation Stay (HOSPITAL_COMMUNITY)
Admission: EM | Admit: 2021-02-19 | Discharge: 2021-02-21 | Disposition: A | Discharge status: Home / Self Care | Payer: Medicaid Other | Attending: Family Medicine | Admitting: Family Medicine

## 2021-02-19 ENCOUNTER — Emergency Department (HOSPITAL_BASED_OUTPATIENT_CLINIC_OR_DEPARTMENT_OTHER)
Admit: 2021-02-19 | Discharge: 2021-02-19 | Disposition: A | Discharge status: Home / Self Care | Payer: Medicaid Other | Attending: Emergency Medicine | Admitting: Emergency Medicine

## 2021-02-19 ENCOUNTER — Emergency Department (HOSPITAL_COMMUNITY): Payer: Medicaid Other

## 2021-02-19 ENCOUNTER — Encounter (HOSPITAL_COMMUNITY): Payer: Self-pay

## 2021-02-19 DIAGNOSIS — R9431 Abnormal electrocardiogram [ECG] [EKG]: Secondary | ICD-10-CM

## 2021-02-19 DIAGNOSIS — R0602 Shortness of breath: Secondary | ICD-10-CM | POA: Diagnosis present

## 2021-02-19 DIAGNOSIS — J9601 Acute respiratory failure with hypoxia: Secondary | ICD-10-CM | POA: Diagnosis not present

## 2021-02-19 DIAGNOSIS — I130 Hypertensive heart and chronic kidney disease with heart failure and stage 1 through stage 4 chronic kidney disease, or unspecified chronic kidney disease: Principal | ICD-10-CM | POA: Insufficient documentation

## 2021-02-19 DIAGNOSIS — Z20822 Contact with and (suspected) exposure to covid-19: Secondary | ICD-10-CM | POA: Diagnosis not present

## 2021-02-19 DIAGNOSIS — Z7982 Long term (current) use of aspirin: Secondary | ICD-10-CM | POA: Insufficient documentation

## 2021-02-19 DIAGNOSIS — Z8673 Personal history of transient ischemic attack (TIA), and cerebral infarction without residual deficits: Secondary | ICD-10-CM | POA: Insufficient documentation

## 2021-02-19 DIAGNOSIS — E1122 Type 2 diabetes mellitus with diabetic chronic kidney disease: Secondary | ICD-10-CM | POA: Diagnosis not present

## 2021-02-19 DIAGNOSIS — Z794 Long term (current) use of insulin: Secondary | ICD-10-CM | POA: Diagnosis not present

## 2021-02-19 DIAGNOSIS — E1169 Type 2 diabetes mellitus with other specified complication: Secondary | ICD-10-CM

## 2021-02-19 DIAGNOSIS — I160 Hypertensive urgency: Secondary | ICD-10-CM | POA: Diagnosis present

## 2021-02-19 DIAGNOSIS — Z87891 Personal history of nicotine dependence: Secondary | ICD-10-CM | POA: Insufficient documentation

## 2021-02-19 DIAGNOSIS — E876 Hypokalemia: Secondary | ICD-10-CM | POA: Insufficient documentation

## 2021-02-19 DIAGNOSIS — I5031 Acute diastolic (congestive) heart failure: Secondary | ICD-10-CM

## 2021-02-19 DIAGNOSIS — R609 Edema, unspecified: Secondary | ICD-10-CM

## 2021-02-19 DIAGNOSIS — E785 Hyperlipidemia, unspecified: Secondary | ICD-10-CM

## 2021-02-19 DIAGNOSIS — I10 Essential (primary) hypertension: Secondary | ICD-10-CM

## 2021-02-19 DIAGNOSIS — Z7984 Long term (current) use of oral hypoglycemic drugs: Secondary | ICD-10-CM | POA: Diagnosis not present

## 2021-02-19 DIAGNOSIS — Z87898 Personal history of other specified conditions: Secondary | ICD-10-CM

## 2021-02-19 DIAGNOSIS — I5033 Acute on chronic diastolic (congestive) heart failure: Secondary | ICD-10-CM | POA: Diagnosis not present

## 2021-02-19 DIAGNOSIS — N1831 Chronic kidney disease, stage 3a: Secondary | ICD-10-CM | POA: Diagnosis not present

## 2021-02-19 DIAGNOSIS — I509 Heart failure, unspecified: Secondary | ICD-10-CM

## 2021-02-19 LAB — BRAIN NATRIURETIC PEPTIDE: B Natriuretic Peptide: 518.4 pg/mL — ABNORMAL HIGH (ref 0.0–100.0)

## 2021-02-19 LAB — CBC WITH DIFFERENTIAL/PLATELET
Abs Immature Granulocytes: 0.03 10*3/uL (ref 0.00–0.07)
Basophils Absolute: 0 10*3/uL (ref 0.0–0.1)
Basophils Relative: 0 %
Eosinophils Absolute: 0.2 10*3/uL (ref 0.0–0.5)
Eosinophils Relative: 2 %
HCT: 39.7 % (ref 39.0–52.0)
Hemoglobin: 12.4 g/dL — ABNORMAL LOW (ref 13.0–17.0)
Immature Granulocytes: 0 %
Lymphocytes Relative: 15 %
Lymphs Abs: 1.7 10*3/uL (ref 0.7–4.0)
MCH: 24.9 pg — ABNORMAL LOW (ref 26.0–34.0)
MCHC: 31.2 g/dL (ref 30.0–36.0)
MCV: 79.7 fL — ABNORMAL LOW (ref 80.0–100.0)
Monocytes Absolute: 0.8 10*3/uL (ref 0.1–1.0)
Monocytes Relative: 7 %
Neutro Abs: 8.2 10*3/uL — ABNORMAL HIGH (ref 1.7–7.7)
Neutrophils Relative %: 76 %
Platelets: 350 10*3/uL (ref 150–400)
RBC: 4.98 MIL/uL (ref 4.22–5.81)
RDW: 16.6 % — ABNORMAL HIGH (ref 11.5–15.5)
WBC: 10.9 10*3/uL — ABNORMAL HIGH (ref 4.0–10.5)
nRBC: 0 % (ref 0.0–0.2)

## 2021-02-19 LAB — RESP PANEL BY RT-PCR (FLU A&B, COVID) ARPGX2
Influenza A by PCR: NEGATIVE
Influenza B by PCR: NEGATIVE
SARS Coronavirus 2 by RT PCR: NEGATIVE

## 2021-02-19 LAB — BASIC METABOLIC PANEL
Anion gap: 8 (ref 5–15)
BUN: 19 mg/dL (ref 6–20)
CO2: 25 mmol/L (ref 22–32)
Calcium: 9 mg/dL (ref 8.9–10.3)
Chloride: 105 mmol/L (ref 98–111)
Creatinine, Ser: 1.68 mg/dL — ABNORMAL HIGH (ref 0.61–1.24)
GFR, Estimated: 52 mL/min — ABNORMAL LOW (ref >60)
Glucose, Bld: 143 mg/dL — ABNORMAL HIGH (ref 70–99)
Potassium: 2.9 mmol/L — ABNORMAL LOW (ref 3.5–5.1)
Sodium: 138 mmol/L (ref 135–145)

## 2021-02-19 MED ORDER — POTASSIUM CHLORIDE CRYS ER 20 MEQ PO TBCR
40.0000 meq | EXTENDED_RELEASE_TABLET | Freq: Once | ORAL | Status: AC
  Administered 2021-02-19: 40 meq via ORAL
  Filled 2021-02-19: qty 2

## 2021-02-19 MED ORDER — IPRATROPIUM BROMIDE 0.02 % IN SOLN
0.5000 mg | Freq: Once | RESPIRATORY_TRACT | Status: AC
  Administered 2021-02-19: 0.5 mg via RESPIRATORY_TRACT
  Filled 2021-02-19: qty 2.5

## 2021-02-19 MED ORDER — CLONIDINE HCL 0.3 MG/24HR TD PTWK
0.3000 mg | MEDICATED_PATCH | TRANSDERMAL | Status: DC
  Administered 2021-02-19: 0.3 mg via TRANSDERMAL
  Filled 2021-02-19: qty 1

## 2021-02-19 MED ORDER — HYDRALAZINE HCL 20 MG/ML IJ SOLN
20.0000 mg | Freq: Once | INTRAMUSCULAR | Status: AC
  Administered 2021-02-19: 20 mg via INTRAVENOUS
  Filled 2021-02-19: qty 1

## 2021-02-19 MED ORDER — ALBUTEROL SULFATE (2.5 MG/3ML) 0.083% IN NEBU
5.0000 mg | INHALATION_SOLUTION | Freq: Once | RESPIRATORY_TRACT | Status: AC
  Administered 2021-02-19: 5 mg via RESPIRATORY_TRACT
  Filled 2021-02-19: qty 6

## 2021-02-19 MED ORDER — FUROSEMIDE 10 MG/ML IJ SOLN
40.0000 mg | Freq: Once | INTRAMUSCULAR | Status: AC
  Administered 2021-02-19: 40 mg via INTRAVENOUS
  Filled 2021-02-19: qty 4

## 2021-02-19 NOTE — ED Provider Notes (Signed)
Care assumed with Dr. Lynelle Doctor.  Patient with a history of hypertension, CKD, diabetes here with increasing shortness of breath and leg swelling.  Has been out of his clonidine patch been taking his other medications.  Chest x-ray consistent with pulmonary edema.  He is tachypneic but not hypoxic on room air.  Creatinine is at baseline.  Patient becomes quite dyspneic with exertion and desaturates to 87%. Would benefit from diuresis overnight.   D/w Dr. Loney Loh.    Micheal Octave, MD 02/20/21 [phone]

## 2021-02-19 NOTE — ED Triage Notes (Signed)
Pt presents with c/o shortness of breath for several days with a cough.

## 2021-02-19 NOTE — ED Notes (Addendum)
Pt said he has not had his BP meds

## 2021-02-19 NOTE — ED Provider Notes (Signed)
[facility] DEPT Provider Note   CSN: [id number] Arrival date & time: 02/19/21  1350     History Chief Complaint  Patient presents with   Shortness of Breath    Micheal Levy is a 40 y.o. male.   Shortness of Breath   Pt has been feeling short of breath since leaving the hospital.  He has noticed his legs still feel swollen.  No fevers.  Pt still is coughing.  He is bringing up phlegm.  Sx get worse with activity.  NO trouble at rest.  Pt sill urinating ok.  Denies other complaints.    Pt has not taken two doses of his blood pressure medication today.  He also ran out of his clonidine medication.  Past Medical History:  Diagnosis Date   Chronic kidney disease    Diabetes mellitus without complication (HCC)    Elevated serum cholesterol 04/2019   Elevated serum creatinine 04/2019   History of seizures    Hyperglycemia    Hypertension    Hypertensive urgency 04/2019   Hypokalemia    Seizures ([place]) 03/2017   x 2    Stroke ([place]) 03/2017   Substance abuse ([employer])    Vitamin D deficiency 04/2019    Patient Active Problem List   Diagnosis Date Noted   Acute respiratory failure with hypoxia ([place]) 01/12/2021   Chronic kidney disease, stage 3a ([place])    Acute on chronic diastolic CHF (congestive heart failure) (HCC)    Elevated troponin    History of ischemic stroke    Ankle fracture, bimalleolar, closed, left, initial encounter 01/18/2020   Seizures ([place]) 09/04/2019   Altered mental status    Acute respiratory failure with hypoxemia ([place])    Hypertensive urgency 04/21/2019   Essential hypertension 04/21/2019   Uncontrolled type 2 diabetes mellitus with hyperglycemia ([place]) 04/21/2019   Hemoglobin A1C between 7% and 9% indicating borderline diabetic control ([place]) 04/21/2019   Incarceration 04/21/2019   History of seizure 04/21/2019   Hypokalemia 04/21/2019   History of substance abuse ([place]) 04/05/2017   Cerebral embolism with cerebral  infarction 03/23/2017   Acute renal failure (ARF) ([place])    Hypertensive emergency    Ventilator dependent ([place])    Hyperglycemia 03/21/2017    History reviewed. No pertinent surgical history.     Family History  Problem Relation Age of Onset   Diabetes Father    Heart attack Father    Diabetes Sister    Hypertension Brother     Social History   Tobacco Use   Smoking status: Former    Types: Cigarettes    Quit date: 04/25/1999    Years since quitting: 21.8   Smokeless tobacco: Never   Tobacco comments:    2001  Vaping Use   Vaping Use: Never used  Substance Use Topics   Alcohol use: Yes    Comment: occ, none since Dec 2018   Drug use: Yes    Types: Cocaine, Marijuana    Comment: weed-daily. cocaine- once or twice a month, 05/25/17 avg 1/2 gm daily, no cocaine since Dec 2018     Home Medications Prior to Admission medications   Medication Sig Start Date End Date Taking? Authorizing Provider  albuterol (PROVENTIL) (2.5 MG/3ML) 0.083% nebulizer solution Take 3 mLs (2.5 mg total) by nebulization every 6 (six) hours as needed for wheezing or shortness of breath. Patient not taking: Reported on 01/12/2021 04/19/19   Azzie Glatter, FNP  albuterol (VENTOLIN HFA) [phone]  Base) MCG/ACT inhaler Inhale 2 puffs into the lungs every 6 (six) hours as needed for wheezing or shortness of breath. 04/19/19   [name], FNP  amLODipine (NORVASC) 10 MG tablet TAKE 1 TABLET (10 MG TOTAL) BY MOUTH DAILY. 01/19/21 01/19/22  Leatha Gilding, MD  aspirin EC 81 MG tablet Take 1 tablet (81 mg total) by mouth daily. 09/13/19   Rolly Salter, MD  atorvastatin (LIPITOR) 10 MG tablet Take 1 tablet (10 mg total) by mouth daily. 01/19/21   Leatha Gilding, MD  Blood Glucose Monitoring Suppl (TRUE METRIX AIR GLUCOSE METER) DEVI 1 each by Does not apply route 4 (four) times daily -  before meals and at bedtime. 04/19/19   [name], FNP  carvedilol (COREG) 25 MG tablet Take 1 tablet (25  mg total) by mouth 2 (two) times daily with a meal. 01/19/21   Gherghe, Daylene Katayama, MD  cloNIDine (CATAPRES - DOSED IN MG/24 HR) 0.3 mg/24hr patch Place 1 patch (0.3 mg total) onto the skin once a week. 01/26/21   Leatha Gilding, MD  feeding supplement, GLUCERNA SHAKE, (GLUCERNA SHAKE) LIQD Take 237 mLs by mouth 3 (three) times daily between meals. Patient not taking: Reported on 01/12/2021 09/13/19   Rolly Salter, MD  furosemide (LASIX) 40 MG tablet Take 1 tablet (40 mg total) by mouth daily. 01/19/21 01/19/22  Leatha Gilding, MD  glipiZIDE (GLUCOTROL XL) 10 MG 24 hr tablet Take 1 tablet (10 mg total) by mouth daily with breakfast. 01/19/21   Gherghe, Daylene Katayama, MD  glucose blood (TRUE METRIX BLOOD GLUCOSE TEST) test strip Use as instructed Patient taking differently: 1 each by Other route as directed. 04/19/19   [name], FNP  hydrALAZINE (APRESOLINE) 100 MG tablet TAKE 1 TABLET (100 MG TOTAL) BY MOUTH 3 (THREE) TIMES DAILY. 01/19/21 01/19/22  Leatha Gilding, MD  insulin glargine (LANTUS SOLOSTAR) 100 UNIT/ML Solostar Pen Inject 5 Units into the skin daily at 10 pm. 01/19/21 01/19/22  Leatha Gilding, MD  Insulin Pen Needle (PEN NEEDLES) 31G X 6 MM MISC 1 each by Does not apply route at bedtime. Patient not taking: Reported on 01/12/2021 10/05/17   Massie Maroon, FNP  INSULIN SYRINGE .5CC/29G (B-D INS SYR ULTRAFINE .5CC/29G) 29G X 1/2" 0.5 ML MISC 1 each by Does not apply route at bedtime. Patient not taking: Reported on 01/12/2021 04/02/17   Massie Maroon, FNP  isosorbide mononitrate (IMDUR) 60 MG 24 hr tablet Take 1 tablet (60 mg total) by mouth daily. 01/19/21   Leatha Gilding, MD  Lancets MISC 1 each by Does not apply route 4 (four) times daily -  before meals and at bedtime. Patient not taking: Reported on 01/12/2021 04/19/19   [name], FNP  levETIRAcetam (KEPPRA) 1000 MG tablet TAKE 1 TABLET (1,000 MG TOTAL) BY MOUTH 2 (TWO) TIMES DAILY. Patient taking  differently: Take 1,000 mg by mouth 2 (two) times daily. 11/11/19 01/12/21  Barbette Merino, NP  Multiple Vitamins-Minerals (MENS ONE DAILY PO) Take 1 tablet by mouth daily.    [provider]  PRESCRIPTION MEDICATION Take 1,000 mg by mouth in the morning and at bedtime. Blood pressure medication. Patient unsure of name but says it's 1000mg  bid.    [provider]  Vitamin D, Ergocalciferol, (DRISDOL) 1.25 MG (50000 UNIT) CAPS capsule Take 1 capsule (50,000 Units total) by mouth every 7 (seven) days. Patient not taking: Reported on 01/12/2021 04/25/19  [name], FNP    Allergies    Patient has no known allergies.  Review of Systems   Review of Systems  Respiratory:  Positive for shortness of breath.   All other systems reviewed and are negative.  Physical Exam Updated Vital Signs BP (!) 190/118   Pulse 92   Temp 98.3 F (36.8 C)   Resp (!) 30   Ht 1.727 m (5\' 8" )   Wt 104.3 kg   SpO2 93%   BMI 34.97 kg/m   Physical Exam Vitals and nursing note reviewed.  Constitutional:      General: He is not in acute distress.    Appearance: He is well-developed.  HENT:     Head: Normocephalic and atraumatic.     Right Ear: External ear normal.     Left Ear: External ear normal.  Eyes:     General: No scleral icterus.       Right eye: No discharge.        Left eye: No discharge.     Conjunctiva/sclera: Conjunctivae normal.  Neck:     Trachea: No tracheal deviation.  Cardiovascular:     Rate and Rhythm: Normal rate and regular rhythm.  Pulmonary:     Effort: Pulmonary effort is normal. No respiratory distress.     Breath sounds: No stridor. Wheezing and rhonchi present. No rales.  Abdominal:     General: Bowel sounds are normal. There is no distension.     Palpations: Abdomen is soft.     Tenderness: There is no abdominal tenderness. There is no guarding or rebound.  Musculoskeletal:        General: No tenderness or deformity.     Cervical back: Neck  supple.     Right lower leg: Edema present.     Left lower leg: Edema present.  Skin:    General: Skin is warm and dry.     Findings: No rash.  Neurological:     General: No focal deficit present.     Mental Status: He is alert.     Cranial Nerves: No cranial nerve deficit (no facial droop, extraocular movements intact, no slurred speech).     Sensory: No sensory deficit.     Motor: No abnormal muscle tone or seizure activity.     Coordination: Coordination normal.  Psychiatric:        Mood and Affect: Mood normal.    ED Results / Procedures / Treatments   Labs (all labs ordered are listed, but only abnormal results are displayed) Labs Reviewed  CBC WITH DIFFERENTIAL/PLATELET - Abnormal; Notable for the following components:      Result Value   WBC 10.9 (*)    Hemoglobin 12.4 (*)    MCV 79.7 (*)    MCH 24.9 (*)    RDW 16.6 (*)    Neutro Abs 8.2 (*)    All other components within normal limits  BASIC METABOLIC PANEL - Abnormal; Notable for the following components:   Potassium 2.9 (*)    Glucose, Bld 143 (*)    Creatinine, Ser 1.68 (*)    GFR, Estimated 52 (*)    All other components within normal limits  BRAIN NATRIURETIC PEPTIDE - Abnormal; Notable for the following components:   B Natriuretic Peptide 518.4 (*)    All other components within normal limits  RESP PANEL BY RT-PCR (FLU A&B, COVID) [id number]    EKG None  Radiology VAS LOWER EXTREMITY VENOUS (DVT) (ONLY [facility] &  [facility])  Result Date: 02/19/2021  Lower Venous DVT Study Patient Name:  DETROY FULLEN  Date of Exam:   02/19/2021 Medical Rec #: [id number]        Accession #:    [id number] Date of Birth: 15-Sep-1980        Patient Gender: M Patient Age:   29 years Exam Location:  [facility] Procedure:      VAS [place] LOWER EXTREMITY VENOUS (DVT) Referring Phys: MADISON REDWINE --------------------------------------------------------------------------------  Indications: Edema.  Risk Factors: None identified.  Limitations: Body habitus and poor ultrasound/tissue interface. Comparison Study: No prior studies. Performing Technologist: Oliver Hum RVT  Examination Guidelines: A complete evaluation includes B-mode imaging, spectral Doppler, color Doppler, and power Doppler as needed of all accessible portions of each vessel. Bilateral testing is considered an integral part of a complete examination. Limited examinations for reoccurring indications may be performed as noted. The reflux portion of the exam is performed with the patient in reverse Trendelenburg.  +---------+---------------+---------+-----------+----------+-------------------+ RIGHT    CompressibilityPhasicitySpontaneityPropertiesThrombus Aging      +---------+---------------+---------+-----------+----------+-------------------+ CFV      Full           Yes      Yes                                      +---------+---------------+---------+-----------+----------+-------------------+ SFJ      Full                                                             +---------+---------------+---------+-----------+----------+-------------------+ FV Prox  Full                                                             +---------+---------------+---------+-----------+----------+-------------------+ FV Mid   Full                                                             +---------+---------------+---------+-----------+----------+-------------------+ FV DistalFull                                                             +---------+---------------+---------+-----------+----------+-------------------+ PFV      Full                                                             +---------+---------------+---------+-----------+----------+-------------------+ POP      Full           Yes      Yes                                      +---------+---------------+---------+-----------+----------+-------------------+  PTV       Full                                                             +---------+---------------+---------+-----------+----------+-------------------+ PERO                                                  Not well visualized +---------+---------------+---------+-----------+----------+-------------------+   +---------+---------------+---------+-----------+----------+-------------------+ LEFT     CompressibilityPhasicitySpontaneityPropertiesThrombus Aging      +---------+---------------+---------+-----------+----------+-------------------+ CFV      Full           Yes      Yes                                      +---------+---------------+---------+-----------+----------+-------------------+ SFJ      Full                                                             +---------+---------------+---------+-----------+----------+-------------------+ FV Prox  Full                                                             +---------+---------------+---------+-----------+----------+-------------------+ FV Mid   Full                                                             +---------+---------------+---------+-----------+----------+-------------------+ FV DistalFull                                                             +---------+---------------+---------+-----------+----------+-------------------+ PFV      Full                                                             +---------+---------------+---------+-----------+----------+-------------------+ POP      Full           Yes      Yes                                      +---------+---------------+---------+-----------+----------+-------------------+ PTV      Full                                                             +---------+---------------+---------+-----------+----------+-------------------+  PERO                                                  Not well visualized  +---------+---------------+---------+-----------+----------+-------------------+     Summary: RIGHT: - There is no evidence of deep vein thrombosis in the lower extremity. However, portions of this examination were limited- see technologist comments above.  - No cystic structure found in the popliteal fossa.  LEFT: - There is no evidence of deep vein thrombosis in the lower extremity. However, portions of this examination were limited- see technologist comments above.  - No cystic structure found in the popliteal fossa.  *See table(s) above for measurements and observations. Electronically signed by Deitra Mayo MD on 02/19/2021 at 3:40:54 PM.    Final     Procedures Procedures   Medications Ordered in ED Medications  cloNIDine (CATAPRES - Dosed in mg/24 hr) patch 0.3 mg (0.3 mg Transdermal Patch Applied 02/19/21 2244)  potassium chloride SA (KLOR-CON) CR tablet 40 mEq (has no administration in time range)  hydrALAZINE (APRESOLINE) injection 20 mg (20 mg Intravenous Given 02/19/21 2242)  albuterol (PROVENTIL) (2.5 MG/3ML) 0.083% nebulizer solution 5 mg (5 mg Nebulization Given 02/19/21 2246)  ipratropium (ATROVENT) nebulizer solution 0.5 mg (0.5 mg Nebulization Given 02/19/21 2246)    ED Course  I have reviewed the triage vital signs and the nursing notes.  Pertinent labs & imaging results that were available during my care of the patient were reviewed by me and considered in my medical decision making (see chart for details).  Clinical Course as of 02/19/21 2324  Tue Feb 19, 2021  2226 Doppler studies negative for DVT [JK]  [id number] Metabolic panel shows decreased potassium of 2.9 [JK]  2316 BNP elevated at 518 but similar to baseline [JK]  2316 CBC reviewed.  Hemoglobin is stable.  White blood cell count slightly elevated [JK]    Clinical Course User Index [JK] Dorie Rank, MD   MDM Rules/Calculators/A&P                           Patient presented to the ED with complaints of  cough and dyspnea.  Patient noted to have peripheral edema on exam.  Is also noted to be significantly hypertensive.  Patient indicated that he had been taking his medications but did run out of his clonidine and did not take his afternoon and evening doses of blood pressure medications because he was here in the ED.  Patient does have some slight wheezing on exam.  Will check a chest x-ray to assess for pneumonia.  We will also assess for pulmonary edema.  Laboratory tests are otherwise unremarkable.  No signs of DVT on Doppler study.  Blood pressure medications have been ordered.  Chest x-ray is still pending.  Patient is not in any respiratory distress and anticipate if his blood pressure improves he may be stable for discharge.  Care turned over to Dr. Wyvonnia Dusky. Final Clinical Impression(s) / ED Diagnoses Final diagnoses:  Uncontrolled hypertension    Rx / DC Orders ED Discharge Orders     None        Dorie Rank, MD 02/19/21 2324

## 2021-02-19 NOTE — ED Provider Notes (Signed)
Emergency Medicine Provider Triage Evaluation Note  Micheal Levy , a 40 y.o. male  was evaluated in triage.  Pt was discharged from the hospital after a 1 week stay due to respiratory failure in the setting of pneumonia.  He went home and has been taking his fluid pills as well as elevating his legs however has noted that they swell up immediately after putting his legs back down.  Also with a cough and exertional shortness of breath.  Was on daily Lovenox during his admission.  He also has chronic renal insufficiency.  Review of Systems  Positive: As above Negative: Hemoptysis  Physical Exam  BP (!) 158/103 (BP Location: Right Arm)   Pulse 76   Temp 98.3 F (36.8 C)   Resp 16   SpO2 94%  Gen:   Awake, no distress   Resp:  Normal effort  MSK:   Moves extremities without difficulty  Other:  2+ pitting edema of the bilateral lower extremities to the level of the knee.  Medical Decision Making  Medically screening exam initiated at 2:30 PM.  Appropriate orders placed.  Gad SAJJAD HONEA was informed that the remainder of the evaluation will be completed by another provider, this initial triage assessment does not replace that evaluation, and the importance of remaining in the ED until their evaluation is complete.    DVT scan ordered bilaterally.  CTA may not be feasible due to his kidney function.   Woodroe Chen 02/19/21 1432    Lorre Nick, MD 02/20/21 [phone]

## 2021-02-19 NOTE — Progress Notes (Signed)
Bilateral lower extremity venous duplex has been completed. Preliminary results can be found in CV Proc through chart review.  Results were given to [facility] [place].  02/19/21 2:55 PM Olen Cordial RVT

## 2021-02-19 NOTE — ED Notes (Signed)
Patient is short of breath with complaints of leg swelling since being discharged from the hospital. Patient also states he has had a persistent cough. Patient has elevated blood pressure and takes 100mg  of hydrochlorothiazide TID. Patient has missed 2 does today.

## 2021-02-20 DIAGNOSIS — I5033 Acute on chronic diastolic (congestive) heart failure: Secondary | ICD-10-CM

## 2021-02-20 DIAGNOSIS — R9431 Abnormal electrocardiogram [ECG] [EKG]: Secondary | ICD-10-CM

## 2021-02-20 DIAGNOSIS — E785 Hyperlipidemia, unspecified: Secondary | ICD-10-CM

## 2021-02-20 DIAGNOSIS — I509 Heart failure, unspecified: Secondary | ICD-10-CM

## 2021-02-20 DIAGNOSIS — E1169 Type 2 diabetes mellitus with other specified complication: Secondary | ICD-10-CM

## 2021-02-20 LAB — CBG MONITORING, ED
Glucose-Capillary: 78 mg/dL (ref 70–99)
Glucose-Capillary: 99 mg/dL (ref 70–99)
Glucose-Capillary: 76 mg/dL (ref 70–99)
Glucose-Capillary: 95 mg/dL (ref 70–99)

## 2021-02-20 LAB — RAPID URINE DRUG SCREEN, HOSP PERFORMED
Amphetamines: NOT DETECTED
Barbiturates: NOT DETECTED
Benzodiazepines: NOT DETECTED
Cocaine: NOT DETECTED
Opiates: NOT DETECTED
Tetrahydrocannabinol: NOT DETECTED

## 2021-02-20 LAB — BASIC METABOLIC PANEL
Anion gap: 11 (ref 5–15)
BUN: 18 mg/dL (ref 6–20)
CO2: 25 mmol/L (ref 22–32)
Calcium: 9 mg/dL (ref 8.9–10.3)
Chloride: 105 mmol/L (ref 98–111)
Creatinine, Ser: 1.65 mg/dL — ABNORMAL HIGH (ref 0.61–1.24)
GFR, Estimated: 53 mL/min — ABNORMAL LOW (ref >60)
Glucose, Bld: 72 mg/dL (ref 70–99)
Potassium: 2.9 mmol/L — ABNORMAL LOW (ref 3.5–5.1)
Sodium: 141 mmol/L (ref 135–145)

## 2021-02-20 LAB — MAGNESIUM: Magnesium: 2 mg/dL (ref 1.7–2.4)

## 2021-02-20 MED ORDER — HYDRALAZINE HCL 20 MG/ML IJ SOLN: 10.0000 mg | INTRAMUSCULAR | Status: DC | PRN

## 2021-02-20 MED ORDER — AMLODIPINE BESYLATE 5 MG PO TABS
10.0000 mg | ORAL_TABLET | Freq: Every day | ORAL | Status: DC
  Administered 2021-02-20 – 2021-02-21 (×2): 10 mg via ORAL
  Filled 2021-02-20 (×2): qty 2

## 2021-02-20 MED ORDER — FUROSEMIDE 10 MG/ML IJ SOLN
40.0000 mg | Freq: Two times a day (BID) | INTRAMUSCULAR | Status: DC
  Administered 2021-02-20 – 2021-02-21 (×3): 40 mg via INTRAVENOUS
  Filled 2021-02-20 (×3): qty 4

## 2021-02-20 MED ORDER — ENOXAPARIN SODIUM 40 MG/0.4ML IJ SOSY
40.0000 mg | PREFILLED_SYRINGE | INTRAMUSCULAR | Status: DC
  Administered 2021-02-20 – 2021-02-21 (×2): 40 mg via SUBCUTANEOUS
  Filled 2021-02-20 (×2): qty 0.4

## 2021-02-20 MED ORDER — INSULIN ASPART 100 UNIT/ML IJ SOLN
0.0000 [IU] | Freq: Three times a day (TID) | INTRAMUSCULAR | Status: DC
  Filled 2021-02-20: qty 0.15

## 2021-02-20 MED ORDER — ACETAMINOPHEN 325 MG PO TABS: 650.0000 mg | ORAL_TABLET | Freq: Four times a day (QID) | ORAL | Status: DC | PRN

## 2021-02-20 MED ORDER — CARVEDILOL 12.5 MG PO TABS
25.0000 mg | ORAL_TABLET | Freq: Two times a day (BID) | ORAL | Status: DC
  Administered 2021-02-20 – 2021-02-21 (×3): 25 mg via ORAL
  Filled 2021-02-20 (×3): qty 2

## 2021-02-20 MED ORDER — POTASSIUM CHLORIDE CRYS ER 20 MEQ PO TBCR
40.0000 meq | EXTENDED_RELEASE_TABLET | Freq: Once | ORAL | Status: AC
  Administered 2021-02-20: 40 meq via ORAL
  Filled 2021-02-20: qty 2

## 2021-02-20 MED ORDER — ATORVASTATIN CALCIUM 10 MG PO TABS
10.0000 mg | ORAL_TABLET | Freq: Every day | ORAL | Status: DC
  Administered 2021-02-20 – 2021-02-21 (×2): 10 mg via ORAL
  Filled 2021-02-20 (×2): qty 1

## 2021-02-20 MED ORDER — ISOSORBIDE MONONITRATE ER 60 MG PO TB24
60.0000 mg | ORAL_TABLET | Freq: Every day | ORAL | Status: DC
  Administered 2021-02-20 – 2021-02-21 (×2): 60 mg via ORAL
  Filled 2021-02-20 (×3): qty 1

## 2021-02-20 MED ORDER — HYDRALAZINE HCL 50 MG PO TABS
100.0000 mg | ORAL_TABLET | Freq: Three times a day (TID) | ORAL | Status: DC
  Administered 2021-02-20 – 2021-02-21 (×4): 100 mg via ORAL
  Filled 2021-02-20 (×8): qty 2

## 2021-02-20 MED ORDER — INSULIN GLARGINE-YFGN 100 UNIT/ML ~~LOC~~ SOLN
5.0000 [IU] | Freq: Every day | SUBCUTANEOUS | Status: DC
  Administered 2021-02-20: 5 [IU] via SUBCUTANEOUS
  Filled 2021-02-20 (×2): qty 0.05

## 2021-02-20 MED ORDER — ACETAMINOPHEN 650 MG RE SUPP: 650.0000 mg | Freq: Four times a day (QID) | RECTAL | Status: DC | PRN

## 2021-02-20 MED ORDER — ASPIRIN EC 81 MG PO TBEC
81.0000 mg | DELAYED_RELEASE_TABLET | Freq: Every day | ORAL | Status: DC
  Administered 2021-02-20 – 2021-02-21 (×2): 81 mg via ORAL
  Filled 2021-02-20 (×2): qty 1

## 2021-02-20 MED ORDER — POTASSIUM CHLORIDE CRYS ER 20 MEQ PO TBCR
40.0000 meq | EXTENDED_RELEASE_TABLET | ORAL | Status: AC
  Administered 2021-02-20 (×2): 40 meq via ORAL
  Filled 2021-02-20 (×2): qty 2

## 2021-02-20 MED ORDER — INSULIN ASPART 100 UNIT/ML IJ SOLN
0.0000 [IU] | Freq: Every day | INTRAMUSCULAR | Status: DC
  Filled 2021-02-20: qty 0.05

## 2021-02-20 NOTE — H&P (Signed)
History and Physical    Micheal Levy [id number] DOB: 09-Apr-1980 DOA: 02/19/2021  PCP: [name], NP Patient coming from: Home  Chief Complaint: Shortness of breath  HPI: Micheal Levy is a 40 y.o. male with medical history significant of chronic diastolic CHF, CKD stage IIIa, insulin-dependent type 2 diabetes, uncontrolled hypertension, hyperlipidemia, seizures, CVA, substance abuse (cocaine and marijuana).  Admitted in October 2022 for multifocal pneumonia, decompensated CHF, hypertensive emergency, AKI.  He presented to the ED yesterday complaining of shortness of breath, cough, and symptoms concerning for volume overload.  Ran out of his home clonidine.  Blood pressure significantly elevated with systolic up to [id number] and diastolic up to [id number].  Dyspneic with ambulation and sats dropped to 87%.  No fever or significant leukocytosis.  Hemoglobin stable.  Potassium 2.9.  Creatinine 1.6, at baseline.  BNP 518.  COVID and influenza PCR negative.  Chest x-ray showing cardiomegaly and vascular congestion.  EKG without acute ischemic changes. Bilateral lower extremity Dopplers negative for DVT. Patient was given bronchodilator treatment, clonidine, IV Lasix 40 mg, IV hydralazine, and potassium supplement.  Patient reports 1 day history of dyspnea on exertion and cough.  States his legs are swollen.  He does not take Lasix.  He drinks 5 (20 oz) bottles of water and another 2 or 3 bottles of Gatorade every day.  He does not eat a lot of salt.  States he is taking all of his blood pressure medications except was due for his weekly clonidine patch yesterday but ran out.  States his blood pressure normally always runs high despite taking all his medications.  Patient states he was previously on [place] for seizures which was stopped by his primary care physician 2 years ago and since then he has not had any recurrent seizures.  Denies fevers, chest pain, nausea, vomiting, abdominal pain, diarrhea, or  dysuria.  Review of Systems:  All systems reviewed and apart from history of presenting illness, are negative.  Past Medical History:  Diagnosis Date   Chronic kidney disease    Diabetes mellitus without complication (HCC)    Elevated serum cholesterol 04/2019   Elevated serum creatinine 04/2019   History of seizures    Hyperglycemia    Hypertension    Hypertensive urgency 04/2019   Hypokalemia    Seizures ([place]) 03/2017   x 2    Stroke ([place]) 03/2017   Substance abuse ([place])    Vitamin D deficiency 04/2019    History reviewed. No pertinent surgical history.   reports that he quit smoking about 21 years ago. His smoking use included cigarettes. He has never used smokeless tobacco. He reports current alcohol use. He reports current drug use. Drugs: Cocaine and Marijuana.  No Known Allergies  Family History  Problem Relation Age of Onset   Diabetes Father    Heart attack Father    Diabetes Sister    Hypertension Brother     Prior to Admission medications   Medication Sig Start Date End Date Taking? Authorizing Provider  amLODipine (NORVASC) 10 MG tablet TAKE 1 TABLET (10 MG TOTAL) BY MOUTH DAILY. Patient taking differently: Take 10 mg by mouth daily. 01/19/21 01/19/22 Yes Caren Griffins, MD  aspirin EC 81 MG tablet Take 1 tablet (81 mg total) by mouth daily. 09/13/19  Yes Lavina Hamman, MD  atorvastatin (LIPITOR) 10 MG tablet Take 1 tablet (10 mg total) by mouth daily. 01/19/21  Yes Caren Griffins, MD  carvedilol (COREG) 25 MG  tablet Take 1 tablet (25 mg total) by mouth 2 (two) times daily with a meal. 01/19/21  Yes Gherghe, Vella Redhead, MD  cloNIDine (CATAPRES - DOSED IN MG/24 HR) 0.3 mg/24hr patch Place 1 patch (0.3 mg total) onto the skin once a week. 01/26/21  Yes Gherghe, Vella Redhead, MD  furosemide (LASIX) 40 MG tablet Take 1 tablet (40 mg total) by mouth daily. 01/19/21 01/19/22 Yes Gherghe, Vella Redhead, MD  glipiZIDE (GLUCOTROL XL) 10 MG 24 hr tablet Take 1 tablet (10 mg  total) by mouth daily with breakfast. 01/19/21  Yes Gherghe, Vella Redhead, MD  hydrALAZINE (APRESOLINE) 100 MG tablet TAKE 1 TABLET (100 MG TOTAL) BY MOUTH 3 (THREE) TIMES DAILY. Patient taking differently: Take 100 mg by mouth 3 (three) times daily. 01/19/21 01/19/22 Yes Gherghe, Vella Redhead, MD  isosorbide mononitrate (IMDUR) 60 MG 24 hr tablet Take 1 tablet (60 mg total) by mouth daily. 01/19/21  Yes Gherghe, Vella Redhead, MD  Multiple Vitamins-Minerals (MENS ONE DAILY PO) Take 1 tablet by mouth daily.   Yes [provider]  albuterol (PROVENTIL) (2.5 MG/3ML) 0.083% nebulizer solution Take 3 mLs (2.5 mg total) by nebulization every 6 (six) hours as needed for wheezing or shortness of breath. Patient not taking: No sig reported 04/19/19   Azzie Glatter, FNP  albuterol (VENTOLIN HFA) 108 (90 Base) MCG/ACT inhaler Inhale 2 puffs into the lungs every 6 (six) hours as needed for wheezing or shortness of breath. Patient not taking: Reported on 02/20/2021 04/19/19   Azzie Glatter, FNP  Blood Glucose Monitoring Suppl (TRUE METRIX AIR GLUCOSE METER) DEVI 1 each by Does not apply route 4 (four) times daily -  before meals and at bedtime. 04/19/19   Azzie Glatter, FNP  glucose blood (TRUE METRIX BLOOD GLUCOSE TEST) test strip Use as instructed Patient taking differently: 1 each by Other route as directed. 04/19/19   Azzie Glatter, FNP  insulin glargine (LANTUS SOLOSTAR) 100 UNIT/ML Solostar Pen Inject 5 Units into the skin daily at 10 pm. 01/19/21 01/19/22  Caren Griffins, MD  Insulin Pen Needle (PEN NEEDLES) 31G X 6 MM MISC 1 each by Does not apply route at bedtime. Patient not taking: Reported on 01/12/2021 10/05/17   Dorena Dew, FNP  INSULIN SYRINGE .5CC/29G (B-D INS SYR ULTRAFINE .5CC/29G) 29G X 1/2" 0.5 ML MISC 1 each by Does not apply route at bedtime. Patient not taking: Reported on 01/12/2021 04/02/17   Dorena Dew, FNP  Lancets MISC 1 each by Does not apply route 4 (four)  times daily -  before meals and at bedtime. Patient not taking: Reported on 01/12/2021 04/19/19   Azzie Glatter, FNP  levETIRAcetam (KEPPRA) 1000 MG tablet TAKE 1 TABLET (1,000 MG TOTAL) BY MOUTH 2 (TWO) TIMES DAILY. Patient not taking: Reported on 02/20/2021 11/11/19 01/12/21  [name], NP  Vitamin D, Ergocalciferol, (DRISDOL) 1.25 MG (50000 UNIT) CAPS capsule Take 1 capsule (50,000 Units total) by mouth every 7 (seven) days. Patient not taking: No sig reported 04/25/19   Azzie Glatter, FNP    Physical Exam: Vitals:   02/20/21 0230 02/20/21 0300 02/20/21 0330 02/20/21 0400  BP: (!) 162/103 (!) 155/109 (!) 160/110 (!) 168/101  Pulse: 77 84 91 73  Resp: (!) 23 (!) 28 (!) 27 (!) 21  Temp:      SpO2: 95% 90% 93% 95%  Weight:      Height:        Physical Exam  Constitutional:      General: He is not in acute distress. HENT:     Head: Normocephalic and atraumatic.  Eyes:     Extraocular Movements: Extraocular movements intact.     Conjunctiva/sclera: Conjunctivae normal.  Neck:     Comments: +JVD Cardiovascular:     Rate and Rhythm: Normal rate and regular rhythm.     Pulses: Normal pulses.  Pulmonary:     Effort: Pulmonary effort is normal. No respiratory distress.     Breath sounds: Rales present. No wheezing.  Abdominal:     General: Bowel sounds are normal. There is distension.     Palpations: Abdomen is soft.     Tenderness: There is no abdominal tenderness. There is no guarding or rebound.  Musculoskeletal:     Cervical back: Normal range of motion and neck supple.     Right lower leg: Edema present.     Left lower leg: Edema present.  Skin:    General: Skin is warm and dry.  Neurological:     General: No focal deficit present.     Mental Status: He is alert and oriented to person, place, and time.     Labs on Admission: I have personally reviewed following labs and imaging studies  CBC: Recent Labs  Lab 02/19/21 2211  WBC 10.9*  NEUTROABS 8.2*  HGB  12.4*  HCT 39.7  MCV 79.7*  PLT 350   Basic Metabolic Panel: Recent Labs  Lab 02/19/21 2211  NA 138  K 2.9*  CL 105  CO2 25  GLUCOSE 143*  BUN 19  CREATININE 1.68*  CALCIUM 9.0   GFR: Estimated Creatinine Clearance: 67.1 mL/min (A) (by C-G formula based on SCr of 1.68 mg/dL (H)). Liver Function Tests: No results for input(s): AST, ALT, ALKPHOS, BILITOT, PROT, ALBUMIN in the last 168 hours. No results for input(s): LIPASE, AMYLASE in the last 168 hours. No results for input(s): AMMONIA in the last 168 hours. Coagulation Profile: No results for input(s): INR, PROTIME in the last 168 hours. Cardiac Enzymes: No results for input(s): CKTOTAL, CKMB, CKMBINDEX, TROPONINI in the last 168 hours. BNP (last 3 results) No results for input(s): PROBNP in the last 8760 hours. HbA1C: No results for input(s): HGBA1C in the last 72 hours. CBG: No results for input(s): GLUCAP in the last 168 hours. Lipid Profile: No results for input(s): CHOL, HDL, LDLCALC, TRIG, CHOLHDL, LDLDIRECT in the last 72 hours. Thyroid Function Tests: No results for input(s): TSH, T4TOTAL, FREET4, T3FREE, THYROIDAB in the last 72 hours. Anemia Panel: No results for input(s): VITAMINB12, FOLATE, FERRITIN, TIBC, IRON, RETICCTPCT in the last 72 hours. Urine analysis:    Component Value Date/Time   COLORURINE YELLOW 01/15/2021 1839   APPEARANCEUR HAZY (A) 01/15/2021 1839   LABSPEC 1.013 01/15/2021 1839   PHURINE 5.0 01/15/2021 1839   GLUCOSEU NEGATIVE 01/15/2021 1839   HGBUR NEGATIVE 01/15/2021 1839   BILIRUBINUR NEGATIVE 01/15/2021 1839   BILIRUBINUR small 11/11/2019 1206   KETONESUR NEGATIVE 01/15/2021 1839   PROTEINUR 100 (A) 01/15/2021 1839   UROBILINOGEN 0.2 11/11/2019 1206   UROBILINOGEN 0.2 06/04/2017 1058   NITRITE NEGATIVE 01/15/2021 1839   LEUKOCYTESUR NEGATIVE 01/15/2021 1839    Radiological Exams on Admission: DG Chest Port 1 View  Result Date: 02/19/2021 CLINICAL DATA:  Shortness of  breath EXAM: PORTABLE CHEST 1 VIEW COMPARISON:  01/18/2021 FINDINGS: Cardiomegaly, vascular congestion. No confluent opacities or effusions. No acute bony abnormality. IMPRESSION: Cardiomegaly, vascular congestion Electronically Signed   By: Caryn Bee  [place] M.D.   On: 02/19/2021 23:38   VAS [place] LOWER EXTREMITY VENOUS (DVT) (ONLY [facility] & [facility])  Result Date: 02/19/2021  Lower Venous DVT Study Patient Name:  Micheal Levy  Date of Exam:   02/19/2021 Medical Rec #: [id number]        Accession #:    [id number] Date of Birth: 07/28/1980        Patient Gender: M Patient Age:   52 years Exam Location:  [facility] Procedure:      VAS [place] LOWER EXTREMITY VENOUS (DVT) Referring Phys: MADISON REDWINE --------------------------------------------------------------------------------  Indications: Edema.  Risk Factors: None identified. Limitations: Body habitus and poor ultrasound/tissue interface. Comparison Study: No prior studies. Performing Technologist: Oliver Hum RVT  Examination Guidelines: A complete evaluation includes B-mode imaging, spectral Doppler, color Doppler, and power Doppler as needed of all accessible portions of each vessel. Bilateral testing is considered an integral part of a complete examination. Limited examinations for reoccurring indications may be performed as noted. The reflux portion of the exam is performed with the patient in reverse Trendelenburg.  +---------+---------------+---------+-----------+----------+-------------------+ RIGHT    CompressibilityPhasicitySpontaneityPropertiesThrombus Aging      +---------+---------------+---------+-----------+----------+-------------------+ CFV      Full           Yes      Yes                                      +---------+---------------+---------+-----------+----------+-------------------+ SFJ      Full                                                              +---------+---------------+---------+-----------+----------+-------------------+ FV Prox  Full                                                             +---------+---------------+---------+-----------+----------+-------------------+ FV Mid   Full                                                             +---------+---------------+---------+-----------+----------+-------------------+ FV DistalFull                                                             +---------+---------------+---------+-----------+----------+-------------------+ PFV      Full                                                             +---------+---------------+---------+-----------+----------+-------------------+ POP      Full  Yes      Yes                                      +---------+---------------+---------+-----------+----------+-------------------+ PTV      Full                                                             +---------+---------------+---------+-----------+----------+-------------------+ PERO                                                  Not well visualized +---------+---------------+---------+-----------+----------+-------------------+   +---------+---------------+---------+-----------+----------+-------------------+ LEFT     CompressibilityPhasicitySpontaneityPropertiesThrombus Aging      +---------+---------------+---------+-----------+----------+-------------------+ CFV      Full           Yes      Yes                                      +---------+---------------+---------+-----------+----------+-------------------+ SFJ      Full                                                             +---------+---------------+---------+-----------+----------+-------------------+ FV Prox  Full                                                             +---------+---------------+---------+-----------+----------+-------------------+ FV  Mid   Full                                                             +---------+---------------+---------+-----------+----------+-------------------+ FV DistalFull                                                             +---------+---------------+---------+-----------+----------+-------------------+ PFV      Full                                                             +---------+---------------+---------+-----------+----------+-------------------+ POP      Full           Yes      Yes                                      +---------+---------------+---------+-----------+----------+-------------------+  PTV      Full                                                             +---------+---------------+---------+-----------+----------+-------------------+ PERO                                                  Not well visualized +---------+---------------+---------+-----------+----------+-------------------+     Summary: RIGHT: - There is no evidence of deep vein thrombosis in the lower extremity. However, portions of this examination were limited- see technologist comments above.  - No cystic structure found in the popliteal fossa.  LEFT: - There is no evidence of deep vein thrombosis in the lower extremity. However, portions of this examination were limited- see technologist comments above.  - No cystic structure found in the popliteal fossa.  *See table(s) above for measurements and observations. Electronically signed by Deitra Mayo MD on 02/19/2021 at 3:40:54 PM.    Final     EKG: Independently reviewed.  Sinus rhythm, no significant change since prior tracing.  Assessment/Plan Principal Problem:   CHF exacerbation (HCC) Active Problems:   Hypertensive urgency   Hypokalemia   Acute hypoxemic respiratory failure (HCC)   Chronic kidney disease, stage 3a (HCC)   Acute hypoxemic respiratory failure secondary to acute on chronic diastolic CHF -Not  hypoxic at rest but became dyspneic and sats dropped to 87% with ambulation in the ED -Appears volume overloaded with JVD, rales, abdominal distention, and significant peripheral edema. -BNP elevated at 518 -Chest x-ray showing cardiomegaly and vascular congestion. -Echo done 01/12/2021 showing EF 55 to 60% and grade 3 diastolic dysfunction. -Suspect decompensated CHF is due to Lasix noncompliance and dietary indiscretion. -Continue diuresis with IV Lasix 40 mg twice a day -Monitor intake and output -Daily weights -Low-sodium diet with fluid restriction -Continuous pulse ox, supplemental oxygen if needed -Cardiac monitoring  Hypertensive urgency -Patient reports compliance with all of his medications except was due for his weekly clonidine patch yesterday and ran out. -Blood pressure significantly elevated in the ED with systolic up to [id number] and diastolic up to [id number] -Now improved after clonidine, IV Lasix, and IV hydralazine.  Systolic currently in the [id number] and diastolic in the 0000000. -Resume home medications including -Continue weekly clonidine patch.  Continue oral medications including amlodipine, Coreg, hydralazine, and Imdur. -IV hydralazine PRN SBP >170 or DBP >110 -Renal artery duplex done 01/15/2021 negative for renal artery stenosis -Plasma renin activity and aldosterone/renin ratio normal on labs done 01/13/2021 -Consider adding spironolactone to his regimen if blood pressure continues to be uncontrolled -UDS ordered given history of cocaine use  Hypokalemia -EKG without acute changes -Monitor potassium and magnesium levels, replenish as needed -Cardiac monitoring  CKD stage IIIa -Creatinine 1.6, at baseline. -Continue to monitor renal function  Insulin-dependent type 2 diabetes -A1c 7.1 on 01/12/2021 -Continue home basal insulin -Sliding scale insulin moderate ACHS  Hyperlipidemia -Continue Lipitor  History of CVA -Continue aspirin and Lipitor  History of  seizures -Per patient, Keppra discontinued by PCP 2 years ago and no recurrence of seizures since then.  DVT prophylaxis: Lovenox Code Status: Full code Family Communication: No family available at this  time. Disposition Plan: Status is: Observation  The patient remains OBS appropriate and will d/c before 2 midnights.  Level of care: Level of care: Telemetry  The medical decision making on this patient was of high complexity and the patient is at high risk for clinical deterioration, therefore this is a level 3 visit.  [name] MD [facility]  If 7PM-7AM, please contact night-coverage [email or web address]  02/20/2021, 4:11 AM

## 2021-02-20 NOTE — Assessment & Plan Note (Signed)
Continue aspirin, lipitor.  

## 2021-02-20 NOTE — Assessment & Plan Note (Addendum)
improved

## 2021-02-20 NOTE — ED Notes (Signed)
Pt resting in bed at this time, MD at bedside. Juice and medications administered, tolerated well.

## 2021-02-20 NOTE — Assessment & Plan Note (Signed)
lipitor

## 2021-02-20 NOTE — Progress Notes (Addendum)
PROGRESS NOTE    Micheal Levy  NTI:[id number] DOB: Jun 10, 1980 DOA: 02/19/2021 PCP: Barbette Merino, NP   Chief Complaint  Patient presents with   Shortness of Breath    Brief Narrative:  Micheal Levy is Micheal Levy 40 y.o. male with medical history significant of chronic diastolic CHF, CKD stage IIIa, insulin-dependent type 2 diabetes, uncontrolled hypertension, hyperlipidemia, seizures, CVA, substance abuse (cocaine and marijuana).  Admitted in October 2022 for multifocal pneumonia, decompensated CHF, hypertensive emergency, AKI.  He presented to the ED yesterday complaining of shortness of breath, cough, and symptoms concerning for volume overload.  Ran out of his home clonidine.  Blood pressure significantly elevated with systolic up to 220s and diastolic up to 120s.  Dyspneic with ambulation and sats dropped to 87%.  No fever or significant leukocytosis.  Hemoglobin stable.  Potassium 2.9.  Creatinine 1.6, at baseline.  BNP 518.  COVID and influenza PCR negative.  Chest x-ray showing cardiomegaly and vascular congestion.  EKG without acute ischemic changes. Bilateral lower extremity Dopplers negative for DVT. Patient was given bronchodilator treatment, clonidine, IV Lasix 40 mg, IV hydralazine, and potassium supplement.   Patient reports 1 day history of dyspnea on exertion and cough.  States his legs are swollen.  He does not take Lasix.  He drinks 5 (20 oz) bottles of water and another 2 or 3 bottles of Gatorade every day.  He does not eat Angelika Jerrett lot of salt.  States he is taking all of his blood pressure medications except was due for his weekly clonidine patch yesterday but ran out.  States his blood pressure normally always runs high despite taking all his medications.  Patient states he was previously on Keppra for seizures which was stopped by his primary care physician 2 years ago and since then he has not had any recurrent seizures.  Denies fevers, chest pain, nausea, vomiting, abdominal pain,  diarrhea, or dysuria.  Assessment & Plan:   Principal Problem:   CHF exacerbation (HCC) Active Problems:   Acute hypoxemic respiratory failure (HCC)   Acute diastolic CHF (congestive heart failure) (HCC)   Hypertensive urgency   Hypokalemia   Abnormal EKG   Chronic kidney disease, stage 3a (HCC)   Type 2 diabetes mellitus with other specified complication (HCC)   Dyslipidemia   History of seizures   History of CVA (cerebrovascular accident)  Acute hypoxemic respiratory failure (HCC) Sats drop to 87% with ambulation Treating for HF exacerbation given overload noted on exam  Acute diastolic CHF (congestive heart failure) (HCC) Echo 01/2021 with EF 55-60%, grade 3 diastolic dysfunction Thought exacerbation/decompensation 2/2 dietary indiscretion and nonadherence to lasix CXR with cardiomegaly and vascular congestion Elevated BNP Continue IV lasix BID Strict I/O, daily weights RD education  Hypertensive urgency SBP up to 220's on presentation Resume home meds - much improved Clonidine patch, coreg, amlodipine, hydralazine Normal renal artery [place] 10/11 (negative for renal artery stenosis) Normal aldosterone/renin ratio 10/9 Follow UDS with hx substance abuse  Abnormal EKG q waves in III and aVF which appear new Follow troponin, no CP at this time Consider repeat limited echo for WMA  Hypokalemia Replace and follow  Chronic kidney disease, stage 3a (HCC) Appears at baseline Continue to monitor with diuresis  Dyslipidemia lipitor  Type 2 diabetes mellitus with other specified complication (HCC) Continue basal and SSI   History of seizures No longer on antiepileptic D/c'd by PCP about 2 yrs ago per his report  History of CVA (cerebrovascular accident) Continue aspirin,  lipitor       DVT prophylaxis: lovenox Code Status: full Family Communication: none at bedside Disposition:   Status is: Observation  The patient remains OBS appropriate and will d/c  before 2 midnights.      Consultants:  none  Procedures:  LE [place] Summary:  RIGHT:  - There is no evidence of deep vein thrombosis in the lower extremity.  However, portions of this examination were limited- see technologist  comments above.     - No cystic structure found in the popliteal fossa.     LEFT:  - There is no evidence of deep vein thrombosis in the lower extremity.  However, portions of this examination were limited- see technologist  comments above.     - No cystic structure found in the popliteal fossa.   Antimicrobials:  Anti-infectives (From admission, onward)    None          Subjective: C/o SOB and LE edema  Objective: Vitals:   02/20/21 1300 02/20/21 1710 02/20/21 1724 02/20/21 1900  BP: 123/73 (!) 143/98 (!) 143/98 121/83  Pulse: 74 67  (!) 58  Resp: (!) 23 17  17   Temp:      SpO2: 99% 99%  96%  Weight:      Height:        Intake/Output Summary (Last 24 hours) at 02/20/2021 2021 Last data filed at 02/20/2021 0236 Gross per 24 hour  Intake --  Output 1125 ml  Net -1125 ml   Filed Weights   02/19/21 2220  Weight: 104.3 kg    Examination:  General exam: Appears calm and comfortable  Respiratory system: Clear to auscultation.  Cardiovascular system: RRR Gastrointestinal system: Abdomen is nondistended, soft and nontender. Central nervous system: Alert and oriented. No focal neurological deficits. Extremities: bilateral LE edema, 2+ Skin: No rashes, lesions or ulcers Psychiatry: Judgement and insight appear normal. Mood & affect appropriate.     Data Reviewed: I have personally reviewed following labs and imaging studies  CBC: Recent Labs  Lab 02/19/21 2211  WBC 10.9*  NEUTROABS 8.2*  HGB 12.4*  HCT 39.7  MCV 79.7*  PLT [id number]    Basic Metabolic Panel: Recent Labs  Lab 02/19/21 2211 02/20/21 0428  NA 138 141  K 2.9* 2.9*  CL 105 105  CO2 25 25  GLUCOSE 143* 72  BUN 19 18  CREATININE 1.68* 1.65*  CALCIUM 9.0  9.0  MG  --  2.0    GFR: Estimated Creatinine Clearance: 68.3 mL/min (Micheal Levy) (by C-G formula based on SCr of 1.65 mg/dL (H)).  Liver Function Tests: No results for input(s): AST, ALT, ALKPHOS, BILITOT, PROT, ALBUMIN in the last 168 hours.  CBG: Recent Labs  Lab 02/20/21 0819 02/20/21 1204 02/20/21 1721  GLUCAP 78 99 76     Recent Results (from the past 240 hour(s))  Resp Panel by RT-PCR (Flu Micheal Levy&B, Covid) Nasopharyngeal Swab     Status: None   Collection Time: 02/19/21 10:41 PM   Specimen: Nasopharyngeal Swab; Nasopharyngeal(NP) swabs in vial transport medium  Result Value Ref Range Status   SARS Coronavirus 2 by RT PCR NEGATIVE NEGATIVE Final    Comment: (NOTE) SARS-CoV-2 target nucleic acids are NOT DETECTED.  The SARS-CoV-2 RNA is generally detectable in upper respiratory specimens during the acute phase of infection. The lowest concentration of SARS-CoV-2 viral copies this assay can detect is 138 copies/mL. Micheal Levy negative result does not preclude SARS-Cov-2 infection and should not be used as the sole basis  for treatment or other patient management decisions. Micheal Levy negative result may occur with  improper specimen collection/handling, submission of specimen other than nasopharyngeal swab, presence of viral mutation(s) within the areas targeted by this assay, and inadequate number of viral copies(<138 copies/mL). Micheal Levy negative result must be combined with clinical observations, patient history, and epidemiological information. The expected result is Negative.  Fact Sheet for Patients:  EntrepreneurPulse.com.au  Fact Sheet for Healthcare Providers:  IncredibleEmployment.be  This test is no t yet approved or cleared by the [place] FDA and  has been authorized for detection and/or diagnosis of SARS-CoV-2 by FDA under an Emergency Use Authorization (EUA). This EUA will remain  in effect (meaning this test can be used) for the duration of  the COVID-19 declaration under Section 564(b)(1) of the Act, 21 U.S.C.section 360bbb-3(b)(1), unless the authorization is terminated  or revoked sooner.       Influenza Micheal Levy by PCR NEGATIVE NEGATIVE Final   Influenza B by PCR NEGATIVE NEGATIVE Final    Comment: (NOTE) The Xpert Xpress SARS-CoV-2/FLU/RSV plus assay is intended as an aid in the diagnosis of influenza from Nasopharyngeal swab specimens and should not be used as Micheal Levy sole basis for treatment. Nasal washings and aspirates are unacceptable for Xpert Xpress SARS-CoV-2/FLU/RSV testing.  Fact Sheet for Patients: EntrepreneurPulse.com.au  Fact Sheet for Healthcare Providers: IncredibleEmployment.be  This test is not yet approved or cleared by the [place] FDA and has been authorized for detection and/or diagnosis of SARS-CoV-2 by FDA under an Emergency Use Authorization (EUA). This EUA will remain in effect (meaning this test can be used) for the duration of the COVID-19 declaration under Section 564(b)(1) of the Act, 21 U.S.C. section 360bbb-3(b)(1), unless the authorization is terminated or revoked.  Performed at [facility], [place] [address], [place], [place] [address]          Radiology Studies: [facility] Chest Port 1 View  Result Date: 02/19/2021 CLINICAL DATA:  Shortness of breath EXAM: PORTABLE CHEST 1 VIEW COMPARISON:  01/18/2021 FINDINGS: Cardiomegaly, vascular congestion. No confluent opacities or effusions. No acute bony abnormality. IMPRESSION: Cardiomegaly, vascular congestion Electronically Signed   By: Rolm Baptise M.D.   On: 02/19/2021 23:38   VAS [place] LOWER EXTREMITY VENOUS (DVT) (ONLY [facility] & [facility])  Result Date: 02/19/2021  Lower Venous DVT Study Patient Name:  Micheal Levy  Date of Exam:   02/19/2021 Medical Rec #: [id number]        Accession #:    [id number] Date of Birth: 1981/01/26        Patient Gender: M Patient Age:   64 years Exam Location:  [facility] Procedure:      VAS [place] LOWER EXTREMITY VENOUS (DVT) Referring Phys: Micheal Levy --------------------------------------------------------------------------------  Indications: Edema.  Risk Factors: None identified. Limitations: Body habitus and poor ultrasound/tissue interface. Comparison Study: No prior studies. Performing Technologist: Micheal Levy RVT  Examination Guidelines: Micheal Levy complete evaluation includes B-mode imaging, spectral Doppler, color Doppler, and power Doppler as needed of all accessible portions of each vessel. Bilateral testing is considered an integral part of Micheal Levy complete examination. Limited examinations for reoccurring indications may be performed as noted. The reflux portion of the exam is performed with the patient in reverse Trendelenburg.  +---------+---------------+---------+-----------+----------+-------------------+ RIGHT    CompressibilityPhasicitySpontaneityPropertiesThrombus Aging      +---------+---------------+---------+-----------+----------+-------------------+ CFV      Full           Yes      Yes                                      +---------+---------------+---------+-----------+----------+-------------------+  SFJ      Full                                                             +---------+---------------+---------+-----------+----------+-------------------+ FV Prox  Full                                                             +---------+---------------+---------+-----------+----------+-------------------+ FV Mid   Full                                                             +---------+---------------+---------+-----------+----------+-------------------+ FV DistalFull                                                             +---------+---------------+---------+-----------+----------+-------------------+ PFV      Full                                                              +---------+---------------+---------+-----------+----------+-------------------+ POP      Full           Yes      Yes                                      +---------+---------------+---------+-----------+----------+-------------------+ PTV      Full                                                             +---------+---------------+---------+-----------+----------+-------------------+ PERO                                                  Not well visualized +---------+---------------+---------+-----------+----------+-------------------+   +---------+---------------+---------+-----------+----------+-------------------+ LEFT     CompressibilityPhasicitySpontaneityPropertiesThrombus Aging      +---------+---------------+---------+-----------+----------+-------------------+ CFV      Full           Yes      Yes                                      +---------+---------------+---------+-----------+----------+-------------------+ SFJ      Full                                                             +---------+---------------+---------+-----------+----------+-------------------+  FV Prox  Full                                                             +---------+---------------+---------+-----------+----------+-------------------+ FV Mid   Full                                                             +---------+---------------+---------+-----------+----------+-------------------+ FV DistalFull                                                             +---------+---------------+---------+-----------+----------+-------------------+ PFV      Full                                                             +---------+---------------+---------+-----------+----------+-------------------+ POP      Full           Yes      Yes                                      +---------+---------------+---------+-----------+----------+-------------------+  PTV      Full                                                             +---------+---------------+---------+-----------+----------+-------------------+ PERO                                                  Not well visualized +---------+---------------+---------+-----------+----------+-------------------+     Summary: RIGHT: - There is no evidence of deep vein thrombosis in the lower extremity. However, portions of this examination were limited- see technologist comments above.  - No cystic structure found in the popliteal fossa.  LEFT: - There is no evidence of deep vein thrombosis in the lower extremity. However, portions of this examination were limited- see technologist comments above.  - No cystic structure found in the popliteal fossa.  *See table(s) above for measurements and observations. Electronically signed by Deitra Mayo MD on 02/19/2021 at 3:40:54 PM.    Final         Scheduled Meds:  amLODipine  10 mg Oral Daily   aspirin EC  81 mg Oral Daily   atorvastatin  10 mg Oral Daily   carvedilol  25 mg Oral BID WC   cloNIDine  0.3 mg Transdermal Weekly   enoxaparin (  LOVENOX) injection  40 mg Subcutaneous Q24H   furosemide  40 mg Intravenous BID   hydrALAZINE  100 mg Oral TID   insulin aspart  0-15 Units Subcutaneous TID WC   insulin aspart  0-5 Units Subcutaneous QHS   insulin glargine-yfgn  5 Units Subcutaneous Q2200   isosorbide mononitrate  60 mg Oral Daily   Continuous Infusions:   LOS: 0 days    Time spent: over 30 min    Fayrene Helper, MD [facility]   To contact the attending provider between 7A-7P or the covering provider during after hours 7P-7A, please log into the web site [email or web address] and access using universal [place] password for that web site. If you do not have the password, please call the hospital operator.  02/20/2021, 8:21 PM

## 2021-02-20 NOTE — Assessment & Plan Note (Addendum)
Sats drop to 87% with ambulation on admission Treating for HF exacerbation given overload noted on exam  Improved today on day of discharge with diuresis.  Maintaining sats on RA.

## 2021-02-20 NOTE — ED Notes (Signed)
Meal tray given with cup of juice at this time

## 2021-02-20 NOTE — Assessment & Plan Note (Addendum)
Echo 01/2021 with EF 55-60%, grade 3 diastolic dysfunction Thought exacerbation/decompensation 2/2 dietary indiscretion and nonadherence to lasix CXR with cardiomegaly and vascular congestion Elevated BNP Continue IV lasix BID -> will transition to lasix 40 mg daily on discharge Strict I/O, daily weights RD education Cardiology referral

## 2021-02-20 NOTE — Assessment & Plan Note (Addendum)
SBP up to 220's on presentation Resume home meds - much improved Clonidine patch, coreg, amlodipine, hydralazine, imdur --- refills prescribed  Normal renal artery [place] 10/11 (negative for renal artery stenosis) Normal aldosterone/renin ratio 10/9 Follow UDS with hx substance abuse - negative Follow with cardiology and PCP outpatient

## 2021-02-20 NOTE — Assessment & Plan Note (Signed)
No longer on antiepileptic D/c'd by PCP about 2 yrs ago per his report

## 2021-02-20 NOTE — Assessment & Plan Note (Addendum)
Resume home meds ?

## 2021-02-20 NOTE — Assessment & Plan Note (Addendum)
Appears at baseline Continue to monitor with diuresis - creatinine increased to 2 with diuresis - still appears lower than recent baseline, continue diuresis outpatient  Renal referral outpatient

## 2021-02-20 NOTE — Assessment & Plan Note (Addendum)
Troponin mildly elevated and flat Will refer to cardiology outpatient  Has had recent echo, will defer repeat to cardiology outpatient.  His symptoms have improved.

## 2021-02-20 NOTE — ED Notes (Signed)
Pt oxygen level was good at the start of ambulation, decreased under 90 and heavy breathing on the way back to the pts room.

## 2021-02-21 ENCOUNTER — Other Ambulatory Visit: Payer: Self-pay

## 2021-02-21 ENCOUNTER — Other Ambulatory Visit (HOSPITAL_COMMUNITY): Payer: Medicaid Other

## 2021-02-21 DIAGNOSIS — I5033 Acute on chronic diastolic (congestive) heart failure: Secondary | ICD-10-CM | POA: Diagnosis not present

## 2021-02-21 LAB — CBC WITH DIFFERENTIAL/PLATELET
Abs Immature Granulocytes: 0.02 10*3/uL (ref 0.00–0.07)
Basophils Absolute: 0 10*3/uL (ref 0.0–0.1)
Basophils Relative: 1 %
Eosinophils Absolute: 0.2 10*3/uL (ref 0.0–0.5)
Eosinophils Relative: 3 %
HCT: 36.3 % — ABNORMAL LOW (ref 39.0–52.0)
Hemoglobin: 11.2 g/dL — ABNORMAL LOW (ref 13.0–17.0)
Immature Granulocytes: 0 %
Lymphocytes Relative: 27 %
Lymphs Abs: 1.7 10*3/uL (ref 0.7–4.0)
MCH: 25.1 pg — ABNORMAL LOW (ref 26.0–34.0)
MCHC: 30.9 g/dL (ref 30.0–36.0)
MCV: 81.4 fL (ref 80.0–100.0)
Monocytes Absolute: 0.9 10*3/uL (ref 0.1–1.0)
Monocytes Relative: 14 %
Neutro Abs: 3.4 10*3/uL (ref 1.7–7.7)
Neutrophils Relative %: 55 %
Platelets: 323 10*3/uL (ref 150–400)
RBC: 4.46 MIL/uL (ref 4.22–5.81)
RDW: 16.9 % — ABNORMAL HIGH (ref 11.5–15.5)
WBC: 6.2 10*3/uL (ref 4.0–10.5)
nRBC: 0 % (ref 0.0–0.2)

## 2021-02-21 LAB — URINALYSIS, COMPLETE (UACMP) WITH MICROSCOPIC
Bilirubin Urine: NEGATIVE
Glucose, UA: NEGATIVE mg/dL
Hgb urine dipstick: NEGATIVE
Ketones, ur: NEGATIVE mg/dL
Leukocytes,Ua: NEGATIVE
Nitrite: NEGATIVE
Protein, ur: 30 mg/dL — AB
Specific Gravity, Urine: 1.005 (ref 1.005–1.030)
pH: 6 (ref 5.0–8.0)

## 2021-02-21 LAB — COMPREHENSIVE METABOLIC PANEL
ALT: 15 U/L (ref 0–44)
AST: 29 U/L (ref 15–41)
Albumin: 3.4 g/dL — ABNORMAL LOW (ref 3.5–5.0)
Alkaline Phosphatase: 52 U/L (ref 38–126)
Anion gap: 7 (ref 5–15)
BUN: 21 mg/dL — ABNORMAL HIGH (ref 6–20)
CO2: 23 mmol/L (ref 22–32)
Calcium: 8.7 mg/dL — ABNORMAL LOW (ref 8.9–10.3)
Chloride: 108 mmol/L (ref 98–111)
Creatinine, Ser: 2.01 mg/dL — ABNORMAL HIGH (ref 0.61–1.24)
GFR, Estimated: 42 mL/min — ABNORMAL LOW (ref >60)
Glucose, Bld: 88 mg/dL (ref 70–99)
Potassium: 3.5 mmol/L (ref 3.5–5.1)
Sodium: 138 mmol/L (ref 135–145)
Total Bilirubin: 0.8 mg/dL (ref 0.3–1.2)
Total Protein: 6.7 g/dL (ref 6.5–8.1)

## 2021-02-21 LAB — PHOSPHORUS: Phosphorus: 3.2 mg/dL (ref 2.5–4.6)

## 2021-02-21 LAB — CBG MONITORING, ED
Glucose-Capillary: 100 mg/dL — ABNORMAL HIGH (ref 70–99)
Glucose-Capillary: 114 mg/dL — ABNORMAL HIGH (ref 70–99)

## 2021-02-21 LAB — TROPONIN I (HIGH SENSITIVITY)
Troponin I (High Sensitivity): 33 ng/L — ABNORMAL HIGH (ref <18)
Troponin I (High Sensitivity): 34 ng/L — ABNORMAL HIGH (ref <18)

## 2021-02-21 LAB — MAGNESIUM: Magnesium: 2 mg/dL (ref 1.7–2.4)

## 2021-02-21 MED ORDER — CARVEDILOL 25 MG PO TABS: 25.0000 mg | ORAL_TABLET | Freq: Two times a day (BID) | ORAL | 1 refills | Status: DC

## 2021-02-21 MED ORDER — AMLODIPINE BESYLATE 10 MG PO TABS: 10.0000 mg | ORAL_TABLET | Freq: Every day | ORAL | 1 refills | Status: DC

## 2021-02-21 MED ORDER — ATORVASTATIN CALCIUM 10 MG PO TABS: 10.0000 mg | ORAL_TABLET | Freq: Every day | ORAL | 1 refills | Status: DC

## 2021-02-21 MED ORDER — FUROSEMIDE 40 MG PO TABS: 40.0000 mg | ORAL_TABLET | Freq: Every day | ORAL | 1 refills | Status: DC

## 2021-02-21 MED ORDER — ISOSORBIDE MONONITRATE ER 60 MG PO TB24: 60.0000 mg | ORAL_TABLET | Freq: Every day | ORAL | 1 refills | Status: DC

## 2021-02-21 MED ORDER — CARVEDILOL 25 MG PO TABS
25.0000 mg | ORAL_TABLET | Freq: Two times a day (BID) | ORAL | 1 refills | Status: DC
Start: 2021-02-21 — End: 2021-02-21
  Filled 2021-02-21: qty 60, 30d supply, fill #0

## 2021-02-21 MED ORDER — HYDRALAZINE HCL 100 MG PO TABS
100.0000 mg | ORAL_TABLET | Freq: Three times a day (TID) | ORAL | 1 refills | Status: DC
  Filled 2021-02-21: qty 90, 30d supply, fill #0

## 2021-02-21 MED ORDER — ISOSORBIDE MONONITRATE ER 60 MG PO TB24
60.0000 mg | ORAL_TABLET | Freq: Every day | ORAL | 1 refills | Status: DC
  Filled 2021-02-21: qty 30, 30d supply, fill #0

## 2021-02-21 MED ORDER — CLONIDINE 0.3 MG/24HR TD PTWK: 0.3000 mg | MEDICATED_PATCH | TRANSDERMAL | 1 refills | Status: AC

## 2021-02-21 MED ORDER — CLONIDINE 0.3 MG/24HR TD PTWK: 0.3000 mg | MEDICATED_PATCH | TRANSDERMAL | 1 refills | Status: DC

## 2021-02-21 MED ORDER — ATORVASTATIN CALCIUM 10 MG PO TABS
10.0000 mg | ORAL_TABLET | Freq: Every day | ORAL | 1 refills | Status: DC
Start: 2021-02-21 — End: 2021-02-21
  Filled 2021-02-21: qty 30, 30d supply, fill #0

## 2021-02-21 MED ORDER — HYDRALAZINE HCL 100 MG PO TABS: 100.0000 mg | ORAL_TABLET | Freq: Three times a day (TID) | ORAL | 1 refills | Status: DC

## 2021-02-21 MED ORDER — ATORVASTATIN CALCIUM 10 MG PO TABS
10.0000 mg | ORAL_TABLET | Freq: Every day | ORAL | 1 refills | Status: DC
Start: 2021-02-21 — End: 2021-02-21

## 2021-02-21 MED ORDER — FUROSEMIDE 40 MG PO TABS
40.0000 mg | ORAL_TABLET | Freq: Every day | ORAL | 1 refills | Status: DC
  Filled 2021-02-21: qty 30, 30d supply, fill #0

## 2021-02-21 MED ORDER — CLONIDINE 0.3 MG/24HR TD PTWK
0.3000 mg | MEDICATED_PATCH | TRANSDERMAL | 1 refills | Status: DC
Start: 2021-02-21 — End: 2021-02-21
  Filled 2021-02-21: qty 4, 28d supply, fill #0

## 2021-02-21 MED ORDER — AMLODIPINE BESYLATE 10 MG PO TABS
10.0000 mg | ORAL_TABLET | Freq: Every day | ORAL | 1 refills | Status: DC
  Filled 2021-02-21: qty 30, 30d supply, fill #0

## 2021-02-21 NOTE — ED Notes (Signed)
Pts O2 sats dropped to 92% while ambulating but then increased to 96% and maintained steady around 95/96%

## 2021-02-21 NOTE — Discharge Summary (Signed)
Physician Discharge Summary  OWENS REMALEY [id number] DOB: [birth year] DOA: 02/19/2021  PCP: [name], NP  Admit date: 02/19/2021 Discharge date: 02/21/2021  Time spent: 40 minutes  Recommendations for Outpatient Follow-up:  Follow outpatient CBC/CMP Follow volume status/lytes/renal function outpatient with resumption of lasix  Cardiology referral outpatient -> ensure he gets follow up Nephrology referral outpatient -> ensure he's able to follow up  Discharge Diagnoses:  Principal Problem:   CHF exacerbation ([place]) Active Problems:   Acute hypoxemic respiratory failure ([place])   Acute diastolic CHF (congestive heart failure) ([place])   Hypertensive urgency   Hypokalemia   Abnormal EKG   Chronic kidney disease, stage 3a ([place])   Type 2 diabetes mellitus with other specified complication ([place])   Dyslipidemia   History of seizures   History of CVA (cerebrovascular accident)   Discharge Condition: stable  Diet recommendation: heart healthy  Filed Weights   02/19/21 2220  Weight: 104.3 kg    History of present illness:  Micheal Levy is Micheal Levy 40 y.o. male with medical history significant of chronic diastolic CHF, CKD stage IIIa, insulin-dependent type 2 diabetes, uncontrolled hypertension, hyperlipidemia, seizures, CVA, substance abuse (cocaine and marijuana).  Admitted in October 2022 for multifocal pneumonia, decompensated CHF, hypertensive emergency, AKI.  He presented to the ED yesterday complaining of shortness of breath, cough, and symptoms concerning for volume overload.  Ran out of his home clonidine.  His blood pressure was significantly elevated.  He was admitted for heart failure exacerbation in the setting of hypertensive urgency.  He's improved with resumption of his blood pressure medicines and lasix.  Plan for discharge 11/17 with outpatient follow up with nephrology and cardiology.  See below for additional details  Hospital Course:  Acute hypoxemic  respiratory failure (HCC) Sats drop to 87% with ambulation on admission Treating for HF exacerbation given overload noted on exam  Improved today on day of discharge with diuresis.  Maintaining sats on RA.  Acute diastolic CHF (congestive heart failure) ([place]) Echo 01/2021 with EF 0000000, grade 3 diastolic dysfunction Thought exacerbation/decompensation 2/2 dietary indiscretion and nonadherence to lasix CXR with cardiomegaly and vascular congestion Elevated BNP Continue IV lasix BID -> will transition to lasix 40 mg daily on discharge Strict I/O, daily weights RD education Cardiology referral  Hypertensive urgency SBP up to 220's on presentation Resume home meds - much improved Clonidine patch, coreg, amlodipine, hydralazine, imdur --- refills prescribed  Normal renal artery [place] 10/11 (negative for renal artery stenosis) Normal aldosterone/renin ratio 10/9 Follow UDS with hx substance abuse - negative Follow with cardiology and PCP outpatient  Abnormal EKG Troponin mildly elevated and flat Will refer to cardiology outpatient  Has had recent echo, will defer repeat to cardiology outpatient.  His symptoms have improved.  Hypokalemia improved  Chronic kidney disease, stage 3a ([place]) Appears at baseline Continue to monitor with diuresis - creatinine increased to 2 with diuresis - still appears lower than recent baseline, continue diuresis outpatient  Renal referral outpatient  Dyslipidemia lipitor  Type 2 diabetes mellitus with other specified complication ([place]) Resume home meds   History of seizures No longer on antiepileptic D/c'd by PCP about 2 yrs ago per his report  History of CVA (cerebrovascular accident) Continue aspirin, lipitor    Procedures: LE [place] Summary:  RIGHT:  - There is no evidence of deep vein thrombosis in the lower extremity.  However, portions of this examination were limited- see technologist  comments above.     - No cystic  structure  found in the popliteal fossa.     LEFT:  - There is no evidence of deep vein thrombosis in the lower extremity.  However, portions of this examination were limited- see technologist  comments above.     - No cystic structure found in the popliteal fossa.   Consultations: none  Discharge Exam: Vitals:   02/21/21 0905 02/21/21 1000  BP: (!) 145/90   Pulse: 74 72  Resp: 20 20  Temp:    SpO2: 97% 93%   Feeling better, got up to bathroom last night without issue Needs refills of BP meds  General: No acute distress. Cardiovascular: RRR Lungs: clear bilaterally Abdomen: Soft, nontender, nondistended Neurological: Alert and oriented 3. Moves all extremities 4. Cranial nerves II through XII grossly intact. Skin: Warm and dry. No rashes or lesions. Extremities: bilateral LE edema  Discharge Instructions   Discharge Instructions     Ambulatory referral to Cardiology   Complete by: As directed    Ambulatory referral to Nephrology   Complete by: As directed    Avoid straining   Complete by: As directed    Call MD for:  difficulty breathing, headache or visual disturbances   Complete by: As directed    Call MD for:  extreme fatigue   Complete by: As directed    Call MD for:  hives   Complete by: As directed    Call MD for:  persistant dizziness or light-headedness   Complete by: As directed    Call MD for:  persistant nausea and vomiting   Complete by: As directed    Call MD for:  redness, tenderness, or signs of infection (pain, swelling, redness, odor or green/yellow discharge around incision site)   Complete by: As directed    Call MD for:  severe uncontrolled pain   Complete by: As directed    Call MD for:  temperature >100.4   Complete by: As directed    Diet - low sodium heart healthy   Complete by: As directed    Diet - low sodium heart healthy   Complete by: As directed    Discharge instructions   Complete by: As directed    You were seen for Micheal Levy heart  failure exacerbation.  This is due to running out of medicines and not taking lasix in addition to dietary indiscretion.  We've refilled your blood pressure medicines.  Follow Micheal Levy low sodium diet.  Continue your lasix as prescribed.  You should have repeat labs within Haeleigh Streiff week or so to ensure your electrolytes and kidney function is stable.  Watch your weights daily.  If your weight increases by more than 2-3 lbs in 1 day or more than 5 lbs in Broly Hatfield week, take an extra dose of lasix and call your PCP for further instructions.  Your kidney function is not normal.  You should follow up with your kidney doctors as an outpatient.  You were seen by the doctors at [facility] at your last hospital stay.  I'll place Mariene Dickerman referral for the cardiologists as well.  Return for new, recurrent, or worsening symptoms.  Please ask your PCP to request records from this hospitalization so they know what was done and what the next steps will be.   Heart Failure patients record your daily weight using the same scale at the same time of day   Complete by: As directed    Increase activity slowly   Complete by: As directed    Increase  activity slowly   Complete by: As directed    STOP any activity that causes chest pain, shortness of breath, dizziness, sweating, or exessive weakness   Complete by: As directed       Allergies as of 02/21/2021   No Known Allergies      Medication List     STOP taking these medications    levETIRAcetam 1000 MG tablet Commonly known as: KEPPRA       TAKE these medications    albuterol 108 (90 Base) MCG/ACT inhaler Commonly known as: VENTOLIN HFA Inhale 2 puffs into the lungs every 6 (six) hours as needed for wheezing or shortness of breath.   albuterol (2.5 MG/3ML) 0.083% nebulizer solution Commonly known as: PROVENTIL Take 3 mLs (2.5 mg total) by nebulization every 6 (six) hours as needed for wheezing or shortness of breath.   amLODipine 10 MG  tablet Commonly known as: NORVASC Take 1 tablet (10 mg total) by mouth daily.   aspirin EC 81 MG tablet Take 1 tablet (81 mg total) by mouth daily.   atorvastatin 10 MG tablet Commonly known as: LIPITOR Take 1 tablet (10 mg total) by mouth daily.   carvedilol 25 MG tablet Commonly known as: COREG Take 1 tablet (25 mg total) by mouth 2 (two) times daily with Brave Dack meal.   cloNIDine 0.3 mg/24hr patch Commonly known as: CATAPRES - Dosed in mg/24 hr Place 1 patch (0.3 mg total) onto the skin once Enrica Corliss week.   furosemide 40 MG tablet Commonly known as: Lasix Take 1 tablet (40 mg total) by mouth daily.   glipiZIDE 10 MG 24 hr tablet Commonly known as: Glucotrol XL Take 1 tablet (10 mg total) by mouth daily with breakfast.   hydrALAZINE 100 MG tablet Commonly known as: APRESOLINE Take 1 tablet (100 mg total) by mouth 3 (three) times daily.   INSULIN SYRINGE .5CC/29G 29G X 1/2" 0.5 ML Misc Commonly known as: B-D INS SYR ULTRAFINE .5CC/29G 1 each by Does not apply route at bedtime.   isosorbide mononitrate 60 MG 24 hr tablet Commonly known as: IMDUR Take 1 tablet (60 mg total) by mouth daily.   Lancets Misc 1 each by Does not apply route 4 (four) times daily -  before meals and at bedtime.   Lantus SoloStar 100 UNIT/ML Solostar Pen Generic drug: insulin glargine Inject 5 Units into the skin daily at 10 pm.   MENS ONE DAILY PO Take 1 tablet by mouth daily.   Pen Needles 31G X 6 MM Misc 1 each by Does not apply route at bedtime.   True Metrix Air Glucose Meter Devi 1 each by Does not apply route 4 (four) times daily -  before meals and at bedtime.   True Metrix Blood Glucose Test test strip Generic drug: glucose blood Use as instructed What changed:  how much to take how to take this when to take this additional instructions   Vitamin D (Ergocalciferol) 1.25 MG (50000 UNIT) Caps capsule Commonly known as: DRISDOL Take 1 capsule (50,000 Units total) by mouth every 7  (seven) days.       No Known Allergies  Follow-up Information     [name], NP Follow up.   Specialty: Adult Health Nurse Practitioner Why: Call for Darcell Yacoub follow up appointment Contact information: [address] [place] [address] [phone]         [place] [facility] Follow up.   Specialty: Cardiology Why: I've placed Lonnette Shrode cardiology  referral, call if you don't receive Lathen Seal phone call within Jacaden Forbush week or so Contact information: [address], [address] [place]        Gean Quint, MD Follow up.   Specialty: Nephrology Why: Call for Caylan Schifano follow up nephrology appointment, you have chronic kidney disease Contact information: [place] [address] [phone]                  The results of significant diagnostics from this hospitalization (including imaging, microbiology, ancillary and laboratory) are listed below for reference.    Significant Diagnostic Studies: DG Chest Port 1 View  Result Date: 02/19/2021 CLINICAL DATA:  Shortness of breath EXAM: PORTABLE CHEST 1 VIEW COMPARISON:  01/18/2021 FINDINGS: Cardiomegaly, vascular congestion. No confluent opacities or effusions. No acute bony abnormality. IMPRESSION: Cardiomegaly, vascular congestion Electronically Signed   By: Rolm Baptise M.D.   On: 02/19/2021 23:38   VAS [place] LOWER EXTREMITY VENOUS (DVT) (ONLY [facility] & [facility])  Result Date: 02/19/2021  Lower Venous DVT Study Patient Name:  KHAMARI TERNES  Date of Exam:   02/19/2021 Medical Rec #: [id number]        Accession #:    [id number] Date of Birth: 09-08-1980        Patient Gender: M Patient Age:   40 years Exam Location:  [facility] Procedure:      VAS [place] LOWER EXTREMITY VENOUS (DVT) Referring Phys: MADISON REDWINE --------------------------------------------------------------------------------  Indications: Edema.  Risk Factors: None identified. Limitations: Body habitus and poor  ultrasound/tissue interface. Comparison Study: No prior studies. Performing Technologist: Oliver Hum RVT  Examination Guidelines: Elizabet Schweppe complete evaluation includes B-mode imaging, spectral Doppler, color Doppler, and power Doppler as needed of all accessible portions of each vessel. Bilateral testing is considered an integral part of Donnamaria Shands complete examination. Limited examinations for reoccurring indications may be performed as noted. The reflux portion of the exam is performed with the patient in reverse Trendelenburg.  +---------+---------------+---------+-----------+----------+-------------------+ RIGHT    CompressibilityPhasicitySpontaneityPropertiesThrombus Aging      +---------+---------------+---------+-----------+----------+-------------------+ CFV      Full           Yes      Yes                                      +---------+---------------+---------+-----------+----------+-------------------+ SFJ      Full                                                             +---------+---------------+---------+-----------+----------+-------------------+ FV Prox  Full                                                             +---------+---------------+---------+-----------+----------+-------------------+ FV Mid   Full                                                             +---------+---------------+---------+-----------+----------+-------------------+  FV DistalFull                                                             +---------+---------------+---------+-----------+----------+-------------------+ PFV      Full                                                             +---------+---------------+---------+-----------+----------+-------------------+ POP      Full           Yes      Yes                                      +---------+---------------+---------+-----------+----------+-------------------+ PTV      Full                                                              +---------+---------------+---------+-----------+----------+-------------------+ PERO                                                  Not well visualized +---------+---------------+---------+-----------+----------+-------------------+   +---------+---------------+---------+-----------+----------+-------------------+ LEFT     CompressibilityPhasicitySpontaneityPropertiesThrombus Aging      +---------+---------------+---------+-----------+----------+-------------------+ CFV      Full           Yes      Yes                                      +---------+---------------+---------+-----------+----------+-------------------+ SFJ      Full                                                             +---------+---------------+---------+-----------+----------+-------------------+ FV Prox  Full                                                             +---------+---------------+---------+-----------+----------+-------------------+ FV Mid   Full                                                             +---------+---------------+---------+-----------+----------+-------------------+ FV DistalFull                                                             +---------+---------------+---------+-----------+----------+-------------------+  PFV      Full                                                             +---------+---------------+---------+-----------+----------+-------------------+ POP      Full           Yes      Yes                                      +---------+---------------+---------+-----------+----------+-------------------+ PTV      Full                                                             +---------+---------------+---------+-----------+----------+-------------------+ PERO                                                  Not well visualized  +---------+---------------+---------+-----------+----------+-------------------+     Summary: RIGHT: - There is no evidence of deep vein thrombosis in the lower extremity. However, portions of this examination were limited- see technologist comments above.  - No cystic structure found in the popliteal fossa.  LEFT: - There is no evidence of deep vein thrombosis in the lower extremity. However, portions of this examination were limited- see technologist comments above.  - No cystic structure found in the popliteal fossa.  *See table(s) above for measurements and observations. Electronically signed by Waverly Ferrari MD on 02/19/2021 at 3:40:54 PM.    Final     Microbiology: Recent Results (from the past 240 hour(s))  Resp Panel by RT-PCR (Flu Arius Harnois&B, Covid) Nasopharyngeal Swab     Status: None   Collection Time: 02/19/21 10:41 PM   Specimen: Nasopharyngeal Swab; Nasopharyngeal(NP) swabs in vial transport medium  Result Value Ref Range Status   SARS Coronavirus 2 by RT PCR NEGATIVE NEGATIVE Final    Comment: (NOTE) SARS-CoV-2 target nucleic acids are NOT DETECTED.  The SARS-CoV-2 RNA is generally detectable in upper respiratory specimens during the acute phase of infection. The lowest concentration of SARS-CoV-2 viral copies this assay can detect is 138 copies/mL. Keyontay Stolz negative result does not preclude SARS-Cov-2 infection and should not be used as the sole basis for treatment or other patient management decisions. Jadah Bobak negative result may occur with  improper specimen collection/handling, submission of specimen other than nasopharyngeal swab, presence of viral mutation(s) within the areas targeted by this assay, and inadequate number of viral copies(<138 copies/mL). Talayla Doyel negative result must be combined with clinical observations, patient history, and epidemiological information. The expected result is Negative.  Fact Sheet for Patients:  BloggerCourse.com  Fact Sheet for  Healthcare Providers:  SeriousBroker.it  This test is no t yet approved or cleared by the [place] FDA and  has been authorized for detection and/or diagnosis of SARS-CoV-2 by FDA under an Emergency Use Authorization (EUA). This EUA will remain  in effect (meaning this test can be used) for  the duration of the COVID-19 declaration under Section 564(b)(1) of the Act, 21 U.S.C.section 360bbb-3(b)(1), unless the authorization is terminated  or revoked sooner.       Influenza Jibreel Fedewa by PCR NEGATIVE NEGATIVE Final   Influenza B by PCR NEGATIVE NEGATIVE Final    Comment: (NOTE) The Xpert Xpress SARS-CoV-2/FLU/RSV plus assay is intended as an aid in the diagnosis of influenza from Nasopharyngeal swab specimens and should not be used as Dilyn Smiles sole basis for treatment. Nasal washings and aspirates are unacceptable for Xpert Xpress SARS-CoV-2/FLU/RSV testing.  Fact Sheet for Patients: EntrepreneurPulse.com.au  Fact Sheet for Healthcare Providers: IncredibleEmployment.be  This test is not yet approved or cleared by the [place] FDA and has been authorized for detection and/or diagnosis of SARS-CoV-2 by FDA under an Emergency Use Authorization (EUA). This EUA will remain in effect (meaning this test can be used) for the duration of the COVID-19 declaration under Section 564(b)(1) of the Act, 21 U.S.C. section 360bbb-3(b)(1), unless the authorization is terminated or revoked.  Performed at [facility], [place] [address], [place], [place] [address]      Labs: Basic Metabolic Panel: Recent Labs  Lab 02/19/21 2211 02/20/21 0428 02/21/21 0518  NA 138 141 138  K 2.9* 2.9* 3.5  CL 105 105 108  CO2 25 25 23   GLUCOSE 143* 72 88  BUN 19 18 21*  CREATININE 1.68* 1.65* 2.01*  CALCIUM 9.0 9.0 8.7*  MG  --  2.0 2.0  PHOS  --   --  3.2   Liver Function Tests: Recent Labs  Lab 02/21/21 0518  AST 29  ALT  15  ALKPHOS 52  BILITOT 0.8  PROT 6.7  ALBUMIN 3.4*   No results for input(s): LIPASE, AMYLASE in the last 168 hours. No results for input(s): AMMONIA in the last 168 hours. CBC: Recent Labs  Lab 02/19/21 2211 02/21/21 0518  WBC 10.9* 6.2  NEUTROABS 8.2* 3.4  HGB 12.4* 11.2*  HCT 39.7 36.3*  MCV 79.7* 81.4  PLT 350 323   Cardiac Enzymes: No results for input(s): CKTOTAL, CKMB, CKMBINDEX, TROPONINI in the last 168 hours. BNP: BNP (last 3 results) Recent Labs    01/12/21 0202 02/19/21 2211  BNP 853.5* 518.4*    ProBNP (last 3 results) No results for input(s): PROBNP in the last 8760 hours.  CBG: Recent Labs  Lab 02/20/21 1204 02/20/21 1721 02/20/21 2208 02/21/21 0759 02/21/21 1149  GLUCAP 99 76 95 100* 114*       Signed:  Fayrene Helper MD.  [facility] 02/21/2021, 11:57 AM

## 2021-02-21 NOTE — ED Notes (Signed)
Pt given lunch tray.

## 2021-02-22 ENCOUNTER — Other Ambulatory Visit: Payer: Self-pay

## 2021-02-23 ENCOUNTER — Telehealth (HOSPITAL_COMMUNITY): Payer: Self-pay | Admitting: *Deleted

## 2021-02-26 ENCOUNTER — Other Ambulatory Visit: Payer: Self-pay

## 2021-02-26 ENCOUNTER — Ambulatory Visit (INDEPENDENT_AMBULATORY_CARE_PROVIDER_SITE_OTHER): Payer: Medicaid Other | Admitting: Cardiology

## 2021-02-26 ENCOUNTER — Encounter: Payer: Self-pay | Admitting: Cardiology

## 2021-02-26 VITALS — BP 160/92 | HR 74 | Ht 68.0 in | Wt 253.0 lb

## 2021-02-26 DIAGNOSIS — E669 Obesity, unspecified: Secondary | ICD-10-CM | POA: Insufficient documentation

## 2021-02-26 DIAGNOSIS — E1165 Type 2 diabetes mellitus with hyperglycemia: Secondary | ICD-10-CM | POA: Diagnosis not present

## 2021-02-26 DIAGNOSIS — Z79899 Other long term (current) drug therapy: Secondary | ICD-10-CM

## 2021-02-26 DIAGNOSIS — N1831 Chronic kidney disease, stage 3a: Secondary | ICD-10-CM

## 2021-02-26 DIAGNOSIS — I10 Essential (primary) hypertension: Secondary | ICD-10-CM

## 2021-02-26 DIAGNOSIS — E66812 Obesity, class 2: Secondary | ICD-10-CM | POA: Insufficient documentation

## 2021-02-26 DIAGNOSIS — I152 Hypertension secondary to endocrine disorders: Secondary | ICD-10-CM | POA: Insufficient documentation

## 2021-02-26 LAB — BASIC METABOLIC PANEL
BUN/Creatinine Ratio: 12 (ref 9–20)
BUN: 24 mg/dL (ref 6–24)
CO2: 26 mmol/L (ref 20–29)
Calcium: 9.5 mg/dL (ref 8.7–10.2)
Chloride: 102 mmol/L (ref 96–106)
Creatinine, Ser: 2.02 mg/dL — ABNORMAL HIGH (ref 0.76–1.27)
Glucose: 105 mg/dL — ABNORMAL HIGH (ref 70–99)
Potassium: 3.8 mmol/L (ref 3.5–5.2)
Sodium: 141 mmol/L (ref 134–144)
eGFR: 41 mL/min/{1.73_m2} — ABNORMAL LOW (ref >59)

## 2021-02-26 LAB — MAGNESIUM: Magnesium: 2 mg/dL (ref 1.6–2.3)

## 2021-02-26 MED ORDER — VALSARTAN 40 MG PO TABS
40.0000 mg | ORAL_TABLET | Freq: Every day | ORAL | 3 refills | Status: DC
  Filled 2021-02-26 – 2021-03-14 (×2): qty 30, 30d supply, fill #0

## 2021-02-26 NOTE — Progress Notes (Signed)
Cardiology Office Note:    Date:  02/26/2021   ID:  Micheal Levy, DOB 19-Mar-1981, MRN [id number]  PCP:  [name], NP  Cardiologist:  Berniece Salines, DO  Electrophysiologist:  None   Referring MD: Elodia Florence.,*   " I am doing fine"  History of Present Illness:    Micheal Levy is a 40 y.o. male with a hx of diabetes mellitus, hypertension with hypertensive heart disease ejection fraction on January 12, 2021 was 77 to 60% with grade 3 diastolic dysfunction,, chronic diastolic heart failure, history of CVA, chronic kidney disease, history of seizures and substance abuse in remission is here today to establish cardiac care.  The patient tells me that he had been asked to see cardiology given the fact that he has been noted over the last several months that have uncontrolled hypertension.  Back in October he was admitted to the hospital for acute on chronic heart failure at which time he was also treated for questionable pneumonia.  Since his discharge from the hospital he has visited the emergency department last week due to the same issue.  His blood pressure was treated he was also given some diuretics and he was discharged to home.  He denies any chest pain or shortness of breath.  But is worried about his blood pressure.    Past Medical History:  Diagnosis Date   Chronic kidney disease    Diabetes mellitus without complication (HCC)    Elevated serum cholesterol 04/2019   Elevated serum creatinine 04/2019   History of seizures    Hyperglycemia    Hypertension    Hypertensive urgency 04/2019   Hypokalemia    Seizures ([place]) 03/2017   x 2    Stroke ([place]) 03/2017   Substance abuse ([place])    Vitamin D deficiency 04/2019    No past surgical history on file.  Current Medications: Current Meds  Medication Sig   albuterol (VENTOLIN HFA) 108 (90 Base) MCG/ACT inhaler Inhale 2 puffs into the lungs every 6 (six) hours as needed for wheezing or shortness of breath.    amLODipine (NORVASC) 10 MG tablet Take 1 tablet (10 mg total) by mouth daily.   aspirin EC 81 MG tablet Take 1 tablet (81 mg total) by mouth daily.   atorvastatin (LIPITOR) 10 MG tablet Take 1 tablet (10 mg total) by mouth daily.   Blood Glucose Monitoring Suppl (TRUE METRIX AIR GLUCOSE METER) DEVI 1 each by Does not apply route 4 (four) times daily -  before meals and at bedtime.   carvedilol (COREG) 25 MG tablet Take 1 tablet (25 mg total) by mouth 2 (two) times daily with a meal.   cloNIDine (CATAPRES - DOSED IN MG/24 HR) 0.3 mg/24hr patch Place 1 patch (0.3 mg total) onto the skin once a week.   furosemide (LASIX) 40 MG tablet Take 1 tablet (40 mg total) by mouth daily.   glipiZIDE (GLUCOTROL XL) 10 MG 24 hr tablet Take 1 tablet (10 mg total) by mouth daily with breakfast.   glucose blood (TRUE METRIX BLOOD GLUCOSE TEST) test strip Use as instructed (Patient taking differently: 1 each by Other route as directed.)   hydrALAZINE (APRESOLINE) 100 MG tablet Take 1 tablet (100 mg total) by mouth 3 (three) times daily.   insulin glargine (LANTUS SOLOSTAR) 100 UNIT/ML Solostar Pen Inject 5 Units into the skin daily at 10 pm.   Multiple Vitamins-Minerals (MENS ONE DAILY PO) Take 1 tablet by mouth daily.  valsartan (DIOVAN) 40 MG tablet Take 1 tablet (40 mg total) by mouth daily.   Vitamin D, Ergocalciferol, (DRISDOL) 1.25 MG (50000 UNIT) CAPS capsule Take 1 capsule (50,000 Units total) by mouth every 7 (seven) days.     Allergies:   Patient has no known allergies.   Social History   Socioeconomic History   Marital status: Single    Spouse name: Not on file   Number of children: Not on file   Years of education: Not on file   Highest education level: Not on file  Occupational History   Not on file  Tobacco Use   Smoking status: Former    Types: Cigarettes    Quit date: 04/25/1999    Years since quitting: 21.8   Smokeless tobacco: Never   Tobacco comments:    2001  Vaping Use    Vaping Use: Never used  Substance and Sexual Activity   Alcohol use: Yes    Comment: occ, none since Dec 2018   Drug use: Yes    Types: Cocaine, Marijuana    Comment: weed-daily. cocaine- once or twice a month, 05/25/17 avg 1/2 gm daily, no cocaine since Dec 2018    Sexual activity: Yes  Other Topics Concern   Not on file  Social History Narrative   ** Merged History Encounter **   05/25/17 Lives with girlfriend   05/25/17 currently out of work   Education- GED   Children- 3    no caffeine use   Social Determinants of Radio broadcast assistant Strain: Not on file  Food Insecurity: Not on file  Transportation Needs: Not on file  Physical Activity: Not on file  Stress: Not on file  Social Connections: Not on file     Family History: The patient's family history includes Diabetes in his father and sister; Heart attack in his father; Hypertension in his brother.  ROS:   Review of Systems  Constitution: Negative for decreased appetite, fever and weight gain.  HENT: Negative for congestion, ear discharge, hoarse voice and sore throat.   Eyes: Negative for discharge, redness, vision loss in right eye and visual halos.  Cardiovascular: Negative for chest pain, dyspnea on exertion, leg swelling, orthopnea and palpitations.  Respiratory: Negative for cough, hemoptysis, shortness of breath and snoring.   Endocrine: Negative for heat intolerance and polyphagia.  Hematologic/Lymphatic: Negative for bleeding problem. Does not bruise/bleed easily.  Skin: Negative for flushing, nail changes, rash and suspicious lesions.  Musculoskeletal: Negative for arthritis, joint pain, muscle cramps, myalgias, neck pain and stiffness.  Gastrointestinal: Negative for abdominal pain, bowel incontinence, diarrhea and excessive appetite.  Genitourinary: Negative for decreased libido, genital sores and incomplete emptying.  Neurological: Negative for brief paralysis, focal weakness, headaches and loss of  balance.  Psychiatric/Behavioral: Negative for altered mental status, depression and suicidal ideas.  Allergic/Immunologic: Negative for HIV exposure and persistent infections.    EKGs/Labs/Other Studies Reviewed:    The following studies were reviewed today:   EKG:  The ekg ordered today demonstrates sinus rhythm, heart rate 76 beats minute with ST segment changes suggesting old anterior wall infarction, T wave inversion in the lateral leads suggestive of lateral wall ischemia however compared to EKG done January 12, 2021 no new findings.  Infarction  Echocardiogram January 12, 2021 IMPRESSIONS   1. Left ventricular ejection fraction, by estimation, is 55 to 60%. The left ventricle has normal function. The left ventricle has no regional  wall motion abnormalities. There is moderate concentric left  ventricular hypertrophy. Left ventricular  diastolic parameters are consistent with Grade III diastolic dysfunction (restrictive). Elevated left atrial pressure.   2. Right ventricular systolic function was not well visualized. The right ventricular size is not well visualized. There is moderately elevated  pulmonary artery systolic pressure. The estimated right ventricular systolic pressure is 56.0 mmHg.   3. Left atrial size was mild to moderately dilated.   4. The mitral valve is normal in structure. No evidence of mitral valve regurgitation.   5. The aortic valve is tricuspid. Aortic valve regurgitation is not visualized. No aortic stenosis is present.   6. The inferior vena cava is dilated in size with <50% respiratory variability, suggesting right atrial pressure of 15 mmHg.   FINDINGS   Left Ventricle: Left ventricular ejection fraction, by estimation, is 55 to 60%. The left ventricle has normal function. The left ventricle has no  regional wall motion abnormalities. Definity contrast agent was given IV to delineate the left ventricular   endocardial borders. The left ventricular internal  cavity size was normal in size. There is moderate concentric left ventricular hypertrophy. Left  ventricular diastolic parameters are consistent with Grade III diastolic dysfunction (restrictive).  Elevated left atrial pressure.   Right Ventricle: The right ventricular size is not well visualized. Right ventricular wall thickness was not well visualized. Right ventricular  systolic function was not well visualized. There is moderately elevated pulmonary artery systolic pressure. The  tricuspid regurgitant velocity is 3.20 m/s, and with an assumed right atrial pressure of 15 mmHg, the estimated right ventricular systolic  pressure is 56.0 mmHg.   Left Atrium: Left atrial size was mild to moderately dilated.   Right Atrium: Right atrial size was not well visualized.   Pericardium: There is no evidence of pericardial effusion.   Mitral Valve: The mitral valve is normal in structure. No evidence of  mitral valve regurgitation.   Tricuspid Valve: The tricuspid valve is normal in structure. Tricuspid  valve regurgitation is mild.   Aortic Valve: The aortic valve is tricuspid. Aortic valve regurgitation is  not visualized. No aortic stenosis is present.   Pulmonic Valve: The pulmonic valve was grossly normal. Pulmonic valve  regurgitation is not visualized.   Aorta: The aortic root and ascending aorta are structurally normal, with  no evidence of dilitation.   Venous: The inferior vena cava is dilated in size with less than 50%  respiratory variability, suggesting right atrial pressure of 15 mmHg.   IAS/Shunts: No atrial level shunt detected by color flow Doppler.       DG Chest 2 View Result Date: 01/12/2021 CLINICAL DATA:  Shortness of breath starting yesterday. EXAM: CHEST - 2 VIEW COMPARISON:  09/08/2019 FINDINGS: There is a diffuse nodular parenchymal pattern to both lungs consistent with multiple pulmonary nodules. This appears to be progressing since the previous study.  Differential diagnosis would include multifocal pneumonia, atypical pneumonia, septic emboli, or developing metastatic disease. CT suggested for further evaluation. No pleural effusions. No pneumothorax. Mediastinal contours appear intact. Cardiac enlargement. IMPRESSION: Diffuse nodular parenchymal pattern to the lungs suggesting multifocal pneumonia versus developing metastatic disease. CT suggested. Electronically Signed   By: Burman NievesWilliam  Stevens M.D.   On: 01/12/2021 02:00    CT Chest Wo Contrast Result Date: 01/12/2021 CLINICAL DATA:  Chest pain or shortness of breath. Pleurisy or effusion suspected. Abnormal chest radiograph. EXAM: CT CHEST WITHOUT CONTRAST TECHNIQUE: Multidetector CT imaging of the chest was performed following the standard protocol without IV contrast. COMPARISON:  Chest  radiograph 01/12/2021 FINDINGS: Cardiovascular: Diffuse cardiac enlargement. No pericardial effusions. Normal caliber thoracic aorta. Mediastinum/Nodes: Esophagus is decompressed. Thyroid gland is unremarkable. Scattered lymph nodes in the mediastinum are not pathologically enlarged, likely reactive. Lungs/Pleura: Motion artifact limits examination. Diffuse pattern of focal and confluent nodular opacities throughout the lungs, most prominent in the central and upper lung distribution. Some air bronchograms are present within the larger areas of involvement. Changes are most likely to represent multifocal pneumonia. This could represent bacterial, viral, or TB/fungal varieties. Short-term follow-up in 3 months is recommended to exclude metastatic disease. No pleural effusions. No pneumothorax. Upper Abdomen: No acute abnormalities identified. Musculoskeletal: No chest wall mass or suspicious bone lesions identified. IMPRESSION: Nodular and confluent infiltrates throughout both lungs most likely infectious or inflammatory. Consider multifocal bacterial pneumonia, COVID pneumonia, or TB/fungal disease. Short-term follow-up CT  in 3 months is recommended to exclude metastatic disease. Electronically Signed   By: Lucienne Capers M.D.   On: 01/12/2021   Recent Labs: 02/19/2021: B Natriuretic Peptide 518.4 02/21/2021: ALT 15; Hemoglobin 11.2; Platelets 323 02/26/2021: BUN 24; Creatinine, Ser 2.02; Magnesium 2.0; Potassium 3.8; Sodium 141  Recent Lipid Panel    Component Value Date/Time   CHOL 91 01/15/2021 0255   CHOL 141 04/19/2019 1134   TRIG 81 01/15/2021 0255   HDL 28 (L) 01/15/2021 0255   HDL 35 (L) 04/19/2019 1134   CHOLHDL 3.3 01/15/2021 0255   VLDL 16 01/15/2021 0255   LDLCALC 47 01/15/2021 0255   LDLCALC 71 04/19/2019 1134    Physical Exam:    VS:  BP (!) 160/92   Pulse 74   Ht 5\' 8"  (1.727 m)   Wt 253 lb (114.8 kg)   SpO2 96%   BMI 38.47 kg/m     Wt Readings from Last 3 Encounters:  02/26/21 253 lb (114.8 kg)  02/19/21 230 lb (104.3 kg)  01/19/21 238 lb 12.1 oz (108.3 kg)     GEN: Well nourished, well developed in no acute distress HEENT: Normal NECK: No JVD; No carotid bruits LYMPHATICS: No lymphadenopathy CARDIAC: S1S2 noted,RRR, no murmurs, rubs, gallops RESPIRATORY:  Clear to auscultation without rales, wheezing or rhonchi  ABDOMEN: Soft, non-tender, non-distended, +bowel sounds, no guarding. EXTREMITIES: No edema, No cyanosis, no clubbing MUSCULOSKELETAL:  No deformity  SKIN: Warm and dry NEUROLOGIC:  Alert and oriented x 3, non-focal PSYCHIATRIC:  Normal affect, good insight  ASSESSMENT:    1. Uncontrolled hypertension   2. Uncontrolled type 2 diabetes mellitus with hyperglycemia ([place])   3. Medication management   4. Obesity (BMI 30-39.9)   5. Stage 3a chronic kidney disease (HCC)    PLAN:    His initial blood pressure in the office was significantly elevated 200/110 mmHg.  Thankfully he had his medications with him and he had not taking these medications.  Therefore I had the patient take his morning medications and monitor him in the office for several hours.  I was  then able to take his blood pressure again manually which his blood pressure slightly improved to 160/90 mmHg with no need for acute hospitalization.  I went over the patient's antihypertensive regimen: He is currently taking carvedilol 25 mg twice daily, amlodipine 10 mg daily, hydralazine 100 mg every 8 hours, he is on a clonidine patch and he has Imdur which she thinks was given to him for his blood pressure.  He has chronic kidney disease but his GFR is greater than 30 and his serum creatinine level is 2, he should  be able to tolerate angiotensin receptor blocker therefore I am going to add valsartan 40 mg daily to his regimen for now.  We will get blood work today for his kidney function as well.  I do not think the clonidine patch is the best for this young patient for optimizing his antihypertensive regimen.  He also does have endorgan damage on his echocardiogram showing LVH which I explained to the patient.  I expressed to him that it is imperative that we work as a team to help him with his hypertension.  Therefore, in the meantime I think he would also benefit from care in our advanced hypertension clinic, I have discussed this with the patient he is agreeable to be seen in the event hypertensive clinic with Dr. Duke Salvia.  The patient is in agreement with the above plan. The patient left the office in stable condition.  The patient will follow up in 6 months or sooner if needed as he will be followed in our advanced hypertensive clinic therefore we will hold off on any close follow-ups with me for now.   Medication Adjustments/Labs and Tests Ordered: Current medicines are reviewed at length with the patient today.  Concerns regarding medicines are outlined above.  Orders Placed This Encounter  Procedures   Basic Metabolic Panel (BMET)   Magnesium   Ambulatory referral to Advanced Hypertension Clinic - [facility] [address]   EKG 12-Lead   Meds ordered this encounter  Medications    valsartan (DIOVAN) 40 MG tablet    Sig: Take 1 tablet (40 mg total) by mouth daily.    Dispense:  90 tablet    Refill:  3    Patient Instructions  Medication Instructions:  Your physician has recommended you make the following change in your medication:  START: Valsartan 40 mg once daily *If you need a refill on your cardiac medications before your next appointment, please call your pharmacy*   Lab Work: Your physician recommends that you return for lab work in:  TODAY: BMET, Mag If you have labs (blood work) drawn today and your tests are completely normal, you will receive your results only by: MyChart Message (if you have MyChart) OR A paper copy in the mail If you have any lab test that is abnormal or we need to change your treatment, we will call you to review the results.   Testing/Procedures: None   Follow-Up: At [facility], you and your health needs are our priority.  As part of our continuing mission to provide you with exceptional heart care, we have created designated Provider Care Teams.  These Care Teams include your primary Cardiologist (physician) and Advanced Practice Providers (APPs -  Physician Assistants and Nurse Practitioners) who all work together to provide you with the care you need, when you need it.  We recommend signing up for the patient portal called "MyChart".  Sign up information is provided on this After Visit Summary.  MyChart is used to connect with patients for Virtual Visits (Telemedicine).  Patients are able to view lab/test results, encounter notes, upcoming appointments, etc.  Non-urgent messages can be sent to your provider as well.   To learn more about what you can do with MyChart, go to [email or web address].    Your next appointment:   6 month(s)  The format for your next appointment:   In Person  Provider:   Thomasene Ripple, DO   Other Instructions     Adopting a Healthy Lifestyle.  Know what a healthy  weight is for you  (roughly BMI <25) and aim to maintain this   Aim for 7+ servings of fruits and vegetables daily   65-80+ fluid ounces of water or unsweet tea for healthy kidneys   Limit to max 1 drink of alcohol per day; avoid smoking/tobacco   Limit animal fats in diet for cholesterol and heart health - choose grass fed whenever available   Avoid highly processed foods, and foods high in saturated/trans fats   Aim for low stress - take time to unwind and care for your mental health   Aim for 150 min of moderate intensity exercise weekly for heart health, and weights twice weekly for bone health   Aim for 7-9 hours of sleep daily   When it comes to diets, agreement about the perfect plan isnt easy to find, even among the experts. Experts at the [employer] developed an idea known as the Healthy Eating Plate. Just imagine a plate divided into logical, healthy portions.   The emphasis is on diet quality:   Load up on vegetables and fruits - one-half of your plate: Aim for color and variety, and remember that potatoes dont count.   Go for whole grains - one-quarter of your plate: Whole wheat, barley, wheat berries, quinoa, oats, brown rice, and foods made with them. If you want pasta, go with whole wheat pasta.   Protein power - one-quarter of your plate: Fish, chicken, beans, and nuts are all healthy, versatile protein sources. Limit red meat.   The diet, however, does go beyond the plate, offering a few other suggestions.   Use healthy plant oils, such as olive, canola, soy, corn, sunflower and peanut. Check the labels, and avoid partially hydrogenated oil, which have unhealthy trans fats.   If youre thirsty, drink water. Coffee and tea are good in moderation, but skip sugary drinks and limit milk and dairy products to one or two daily servings.   The type of carbohydrate in the diet is more important than the amount. Some sources of carbohydrates, such as vegetables, fruits,  whole grains, and beans-are healthier than others.   Finally, stay active  Signed, Berniece Salines, DO  02/26/2021 8:29 PM    [facility]

## 2021-02-26 NOTE — Patient Instructions (Signed)
Medication Instructions:  Your physician has recommended you make the following change in your medication:  START: Valsartan 40 mg once daily *If you need a refill on your cardiac medications before your next appointment, please call your pharmacy*   Lab Work: Your physician recommends that you return for lab work in:  TODAY: BMET, Mag If you have labs (blood work) drawn today and your tests are completely normal, you will receive your results only by: MyChart Message (if you have MyChart) OR A paper copy in the mail If you have any lab test that is abnormal or we need to change your treatment, we will call you to review the results.   Testing/Procedures: None   Follow-Up: At [facility], you and your health needs are our priority.  As part of our continuing mission to provide you with exceptional heart care, we have created designated Provider Care Teams.  These Care Teams include your primary Cardiologist (physician) and Advanced Practice Providers (APPs -  Physician Assistants and Nurse Practitioners) who all work together to provide you with the care you need, when you need it.  We recommend signing up for the patient portal called "MyChart".  Sign up information is provided on this After Visit Summary.  MyChart is used to connect with patients for Virtual Visits (Telemedicine).  Patients are able to view lab/test results, encounter notes, upcoming appointments, etc.  Non-urgent messages can be sent to your provider as well.   To learn more about what you can do with MyChart, go to [email or web address].    Your next appointment:   6 month(s)  The format for your next appointment:   In Person  Provider:   Thomasene Ripple, DO   Other Instructions

## 2021-03-05 ENCOUNTER — Other Ambulatory Visit: Payer: Self-pay

## 2021-03-11 ENCOUNTER — Other Ambulatory Visit: Payer: Self-pay

## 2021-03-14 ENCOUNTER — Other Ambulatory Visit: Payer: Self-pay

## 2021-04-22 ENCOUNTER — Emergency Department (HOSPITAL_COMMUNITY): Payer: Medicaid Other

## 2021-04-22 ENCOUNTER — Encounter (HOSPITAL_COMMUNITY): Payer: Self-pay | Admitting: Emergency Medicine

## 2021-04-22 ENCOUNTER — Observation Stay (HOSPITAL_COMMUNITY)
Admission: EM | Admit: 2021-04-22 | Discharge: 2021-04-24 | Disposition: A | Discharge status: Home / Self Care | Payer: Medicaid Other | Attending: Internal Medicine | Admitting: Internal Medicine

## 2021-04-22 DIAGNOSIS — Z79899 Other long term (current) drug therapy: Secondary | ICD-10-CM | POA: Diagnosis not present

## 2021-04-22 DIAGNOSIS — Z7982 Long term (current) use of aspirin: Secondary | ICD-10-CM | POA: Diagnosis not present

## 2021-04-22 DIAGNOSIS — Z8673 Personal history of transient ischemic attack (TIA), and cerebral infarction without residual deficits: Secondary | ICD-10-CM | POA: Insufficient documentation

## 2021-04-22 DIAGNOSIS — E669 Obesity, unspecified: Secondary | ICD-10-CM | POA: Diagnosis not present

## 2021-04-22 DIAGNOSIS — I160 Hypertensive urgency: Secondary | ICD-10-CM | POA: Insufficient documentation

## 2021-04-22 DIAGNOSIS — E785 Hyperlipidemia, unspecified: Secondary | ICD-10-CM | POA: Diagnosis present

## 2021-04-22 DIAGNOSIS — J0300 Acute streptococcal tonsillitis, unspecified: Principal | ICD-10-CM | POA: Insufficient documentation

## 2021-04-22 DIAGNOSIS — Z794 Long term (current) use of insulin: Secondary | ICD-10-CM | POA: Insufficient documentation

## 2021-04-22 DIAGNOSIS — E1122 Type 2 diabetes mellitus with diabetic chronic kidney disease: Secondary | ICD-10-CM | POA: Diagnosis not present

## 2021-04-22 DIAGNOSIS — J029 Acute pharyngitis, unspecified: Secondary | ICD-10-CM | POA: Diagnosis present

## 2021-04-22 DIAGNOSIS — I129 Hypertensive chronic kidney disease with stage 1 through stage 4 chronic kidney disease, or unspecified chronic kidney disease: Secondary | ICD-10-CM | POA: Diagnosis not present

## 2021-04-22 DIAGNOSIS — Z87891 Personal history of nicotine dependence: Secondary | ICD-10-CM | POA: Insufficient documentation

## 2021-04-22 DIAGNOSIS — I5032 Chronic diastolic (congestive) heart failure: Secondary | ICD-10-CM | POA: Diagnosis not present

## 2021-04-22 DIAGNOSIS — Z87898 Personal history of other specified conditions: Secondary | ICD-10-CM

## 2021-04-22 DIAGNOSIS — N189 Chronic kidney disease, unspecified: Secondary | ICD-10-CM | POA: Diagnosis not present

## 2021-04-22 DIAGNOSIS — E1169 Type 2 diabetes mellitus with other specified complication: Secondary | ICD-10-CM | POA: Diagnosis present

## 2021-04-22 DIAGNOSIS — J020 Streptococcal pharyngitis: Secondary | ICD-10-CM

## 2021-04-22 DIAGNOSIS — Z7984 Long term (current) use of oral hypoglycemic drugs: Secondary | ICD-10-CM | POA: Diagnosis not present

## 2021-04-22 LAB — I-STAT CHEM 8, ED
BUN: 20 mg/dL (ref 6–20)
Calcium, Ion: 1.12 mmol/L — ABNORMAL LOW (ref 1.15–1.40)
Chloride: 99 mmol/L (ref 98–111)
Creatinine, Ser: 1.8 mg/dL — ABNORMAL HIGH (ref 0.61–1.24)
Glucose, Bld: 152 mg/dL — ABNORMAL HIGH (ref 70–99)
HCT: 46 % (ref 39.0–52.0)
Hemoglobin: 15.6 g/dL (ref 13.0–17.0)
Potassium: 5 mmol/L (ref 3.5–5.1)
Sodium: 138 mmol/L (ref 135–145)
TCO2: 30 mmol/L (ref 22–32)

## 2021-04-22 LAB — GLUCOSE, CAPILLARY: Glucose-Capillary: 249 mg/dL — ABNORMAL HIGH (ref 70–99)

## 2021-04-22 LAB — GROUP A STREP BY PCR: Group A Strep by PCR: DETECTED — AB

## 2021-04-22 LAB — CBG MONITORING, ED
Glucose-Capillary: 147 mg/dL — ABNORMAL HIGH (ref 70–99)
Glucose-Capillary: 254 mg/dL — ABNORMAL HIGH (ref 70–99)

## 2021-04-22 MED ORDER — HYDRALAZINE HCL 100 MG PO TABS: 100.0000 mg | ORAL_TABLET | Freq: Three times a day (TID) | ORAL | Status: DC

## 2021-04-22 MED ORDER — ENOXAPARIN SODIUM 40 MG/0.4ML IJ SOSY
40.0000 mg | PREFILLED_SYRINGE | INTRAMUSCULAR | Status: DC
  Administered 2021-04-22 – 2021-04-23 (×2): 40 mg via SUBCUTANEOUS
  Filled 2021-04-22: qty 0.4

## 2021-04-22 MED ORDER — PENICILLIN G BENZATHINE 1200000 UNIT/2ML IM SUSY
1.2000 10*6.[IU] | PREFILLED_SYRINGE | Freq: Once | INTRAMUSCULAR | Status: AC
  Administered 2021-04-22: 1.2 10*6.[IU] via INTRAMUSCULAR
  Filled 2021-04-22: qty 2

## 2021-04-22 MED ORDER — DEXAMETHASONE SODIUM PHOSPHATE 10 MG/ML IJ SOLN
10.0000 mg | Freq: Once | INTRAMUSCULAR | Status: DC
  Filled 2021-04-22: qty 1

## 2021-04-22 MED ORDER — SODIUM CHLORIDE 0.9 % IV BOLUS
1000.0000 mL | Freq: Once | INTRAVENOUS | Status: AC
Start: 2021-04-22 — End: 2021-04-22
  Administered 2021-04-22: 1000 mL via INTRAVENOUS

## 2021-04-22 MED ORDER — AMLODIPINE BESYLATE 10 MG PO TABS
10.0000 mg | ORAL_TABLET | Freq: Every day | ORAL | Status: DC
  Administered 2021-04-22 – 2021-04-24 (×3): 10 mg via ORAL
  Filled 2021-04-22: qty 1
  Filled 2021-04-22: qty 2
  Filled 2021-04-22: qty 1

## 2021-04-22 MED ORDER — HYDROMORPHONE HCL 1 MG/ML IJ SOLN: 1.0000 mg | INTRAMUSCULAR | Status: DC | PRN

## 2021-04-22 MED ORDER — METOPROLOL TARTRATE 5 MG/5ML IV SOLN
5.0000 mg | Freq: Four times a day (QID) | INTRAVENOUS | Status: DC
  Administered 2021-04-22: 5 mg via INTRAVENOUS
  Filled 2021-04-22: qty 5

## 2021-04-22 MED ORDER — INSULIN GLARGINE 100 UNIT/ML SOLOSTAR PEN: 5.0000 [IU] | PEN_INJECTOR | Freq: Every day | SUBCUTANEOUS | Status: DC

## 2021-04-22 MED ORDER — ATORVASTATIN CALCIUM 10 MG PO TABS
10.0000 mg | ORAL_TABLET | Freq: Every day | ORAL | Status: DC
  Administered 2021-04-23 – 2021-04-24 (×2): 10 mg via ORAL
  Filled 2021-04-22 (×2): qty 1

## 2021-04-22 MED ORDER — FUROSEMIDE 40 MG PO TABS
40.0000 mg | ORAL_TABLET | Freq: Every day | ORAL | Status: DC
  Administered 2021-04-22 – 2021-04-24 (×3): 40 mg via ORAL
  Filled 2021-04-22 (×3): qty 1

## 2021-04-22 MED ORDER — GLIPIZIDE ER 5 MG PO TB24
10.0000 mg | ORAL_TABLET | Freq: Every day | ORAL | Status: DC
  Administered 2021-04-23 – 2021-04-24 (×2): 10 mg via ORAL
  Filled 2021-04-22 (×2): qty 2
  Filled 2021-04-22: qty 1

## 2021-04-22 MED ORDER — METOPROLOL TARTRATE 5 MG/5ML IV SOLN
5.0000 mg | Freq: Once | INTRAVENOUS | Status: AC
Start: 2021-04-22 — End: 2021-04-22
  Administered 2021-04-22: 5 mg via INTRAVENOUS
  Filled 2021-04-22: qty 5

## 2021-04-22 MED ORDER — ONDANSETRON HCL 4 MG/2ML IJ SOLN: 4.0000 mg | Freq: Four times a day (QID) | INTRAMUSCULAR | Status: DC | PRN

## 2021-04-22 MED ORDER — HYDRALAZINE HCL 20 MG/ML IJ SOLN
20.0000 mg | INTRAMUSCULAR | Status: DC | PRN
  Administered 2021-04-23: 20 mg via INTRAVENOUS
  Filled 2021-04-22: qty 1

## 2021-04-22 MED ORDER — CARVEDILOL 12.5 MG PO TABS
25.0000 mg | ORAL_TABLET | Freq: Two times a day (BID) | ORAL | Status: DC
Start: 2021-04-22 — End: 2021-04-22

## 2021-04-22 MED ORDER — INSULIN GLARGINE-YFGN 100 UNIT/ML ~~LOC~~ SOLN
5.0000 [IU] | Freq: Every day | SUBCUTANEOUS | Status: DC
  Administered 2021-04-23 (×2): 5 [IU] via SUBCUTANEOUS
  Filled 2021-04-22 (×4): qty 0.05

## 2021-04-22 MED ORDER — ASPIRIN EC 81 MG PO TBEC
81.0000 mg | DELAYED_RELEASE_TABLET | Freq: Every day | ORAL | Status: DC
  Administered 2021-04-23 – 2021-04-24 (×2): 81 mg via ORAL
  Filled 2021-04-22 (×2): qty 1

## 2021-04-22 MED ORDER — SODIUM CHLORIDE (PF) 0.9 % IJ SOLN
INTRAMUSCULAR | Status: AC
  Filled 2021-04-22: qty 50

## 2021-04-22 MED ORDER — ALBUTEROL SULFATE (2.5 MG/3ML) 0.083% IN NEBU: 2.5000 mg | INHALATION_SOLUTION | Freq: Four times a day (QID) | RESPIRATORY_TRACT | Status: DC | PRN

## 2021-04-22 MED ORDER — INSULIN ASPART 100 UNIT/ML IJ SOLN
0.0000 [IU] | Freq: Three times a day (TID) | INTRAMUSCULAR | Status: DC
  Administered 2021-04-22: 2 [IU] via SUBCUTANEOUS
  Administered 2021-04-23 – 2021-04-24 (×2): 3 [IU] via SUBCUTANEOUS
  Filled 2021-04-22: qty 0.15

## 2021-04-22 MED ORDER — SODIUM CHLORIDE 0.45 % IV SOLN: INTRAVENOUS | Status: DC

## 2021-04-22 MED ORDER — IOHEXOL 300 MG/ML  SOLN
75.0000 mL | Freq: Once | INTRAMUSCULAR | Status: AC | PRN
  Administered 2021-04-22: 75 mL via INTRAVENOUS

## 2021-04-22 MED ORDER — ISOSORBIDE MONONITRATE ER 60 MG PO TB24
60.0000 mg | ORAL_TABLET | Freq: Every day | ORAL | Status: DC
  Administered 2021-04-22 – 2021-04-24 (×3): 60 mg via ORAL
  Filled 2021-04-22 (×3): qty 1

## 2021-04-22 MED ORDER — CARVEDILOL 25 MG PO TABS
25.0000 mg | ORAL_TABLET | Freq: Two times a day (BID) | ORAL | Status: DC
  Administered 2021-04-22 – 2021-04-24 (×4): 25 mg via ORAL
  Filled 2021-04-22: qty 2
  Filled 2021-04-22 (×3): qty 1

## 2021-04-22 MED ORDER — HYDRALAZINE HCL 50 MG PO TABS
100.0000 mg | ORAL_TABLET | Freq: Three times a day (TID) | ORAL | Status: DC
  Administered 2021-04-22 – 2021-04-24 (×6): 100 mg via ORAL
  Filled 2021-04-22: qty 2
  Filled 2021-04-22: qty 4
  Filled 2021-04-22 (×4): qty 2

## 2021-04-22 MED ORDER — INSULIN GLARGINE 100 UNIT/ML SOLOSTAR PEN
5.0000 [IU] | PEN_INJECTOR | Freq: Every day | SUBCUTANEOUS | Status: DC
Start: 2021-04-23 — End: 2021-04-22

## 2021-04-22 MED ORDER — KETOROLAC TROMETHAMINE 15 MG/ML IJ SOLN
15.0000 mg | Freq: Once | INTRAMUSCULAR | Status: AC
  Administered 2021-04-22: 15 mg via INTRAVENOUS
  Filled 2021-04-22: qty 1

## 2021-04-22 MED ORDER — AMLODIPINE BESYLATE 5 MG PO TABS: 10.0000 mg | ORAL_TABLET | Freq: Every day | ORAL | Status: DC

## 2021-04-22 MED ORDER — KETOROLAC TROMETHAMINE 30 MG/ML IJ SOLN
30.0000 mg | Freq: Once | INTRAMUSCULAR | Status: DC
  Filled 2021-04-22: qty 1

## 2021-04-22 MED ORDER — METOPROLOL TARTRATE 5 MG/5ML IV SOLN: 10.0000 mg | Freq: Once | INTRAVENOUS | Status: DC

## 2021-04-22 MED ORDER — IRBESARTAN 75 MG PO TABS
37.5000 mg | ORAL_TABLET | Freq: Every day | ORAL | Status: DC
  Administered 2021-04-22 – 2021-04-24 (×3): 37.5 mg via ORAL
  Filled 2021-04-22: qty 1
  Filled 2021-04-22: qty 0.5
  Filled 2021-04-22: qty 1

## 2021-04-22 MED ORDER — ONDANSETRON HCL 4 MG PO TABS: 4.0000 mg | ORAL_TABLET | Freq: Four times a day (QID) | ORAL | Status: DC | PRN

## 2021-04-22 MED ORDER — METHYLPREDNISOLONE SODIUM SUCC 125 MG IJ SOLR
125.0000 mg | Freq: Once | INTRAMUSCULAR | Status: AC
  Administered 2021-04-22: 125 mg via INTRAVENOUS
  Filled 2021-04-22: qty 2

## 2021-04-22 NOTE — H&P (Signed)
History and Physical    Micheal Levy [id number] DOB: [birth year] DOA: 04/22/2021  PCP: [name], NP   Patient coming from: Home.  I have personally briefly reviewed patient's old medical records in [place]  Chief Complaint: Muffled voice, difficulty swallowing and sore throat.  HPI: Micheal Levy is a 41 y.o. male with medical history significant of CKD, type II DM, hypertension, history of seizures no longer on antiepileptic medication, history of other nonhemorrhagic stroke, history of polysubstance abuse, vitamin D deficiency, class II obesity who is coming to the emergency department with complaints of severe sore throat with odynophagia when trying to eat.  He has been unable to take his medications in the past 2 days due to pain.  No fever, chills, but positive malaise and cervical adenopathies.  No chest pain, palpitations, dyspnea, wheezing or hemoptysis.  No diaphoresis, PND, orthopnea or or recent pitting edema of the lower extremities.  Denied nausea, vomiting, diarrhea, constipation, melena hematochezia.  No dysuria, frequency or hematuria.  ED Course: Initial vital signs were temperature Initial vital signs temperature 98.8 F, pulse 102, respirations 18, BP 236/146 mmHg O2 sat 96% on room air.  The patient received 125 mg of Solu-Medrol, 1,200,000 units of penicillin G IM, metoprolol 5 mg IVP, Toradol 15 mg IVP x1 and 1000 mL of NS bolus.    Lab work: Group A strep by PCR was detected.  I-STAT Chem-8 showed a creatinine of 1.8, glucose of 152 mg/dL.  Review of Systems: As per HPI otherwise all other systems reviewed and are negative.  Past Medical History:  Diagnosis Date   Chronic kidney disease    Diabetes mellitus without complication (HCC)    Elevated serum cholesterol 04/2019   Elevated serum creatinine 04/2019   History of seizures    Hyperglycemia    Hypertension    Hypertensive urgency 04/2019   Hypokalemia    Seizures ([place]) 03/2017   x 2     Stroke ([place]) 03/2017   Substance abuse ([place])    Vitamin D deficiency 04/2019    History reviewed. No pertinent surgical history.  Social History  reports that he quit smoking about 22 years ago. His smoking use included cigarettes. He has never used smokeless tobacco. He reports current alcohol use. He reports current drug use. Drugs: Cocaine and Marijuana.  No Known Allergies  Family History  Problem Relation Age of Onset   Diabetes Father    Heart attack Father    Diabetes Sister    Hypertension Brother    Prior to Admission medications   Medication Sig Start Date End Date Taking? Authorizing Provider  albuterol (VENTOLIN HFA) 108 (90 Base) MCG/ACT inhaler Inhale 2 puffs into the lungs every 6 (six) hours as needed for wheezing or shortness of breath. 04/19/19  Yes Azzie Glatter, FNP  amLODipine (NORVASC) 10 MG tablet Take 1 tablet (10 mg total) by mouth daily. 02/21/21 04/22/21 Yes Elodia Florence., MD  aspirin EC 81 MG tablet Take 1 tablet (81 mg total) by mouth daily. 09/13/19  Yes Lavina Hamman, MD  atorvastatin (LIPITOR) 10 MG tablet Take 1 tablet (10 mg total) by mouth daily. 02/21/21 04/22/21 Yes Elodia Florence., MD  carvedilol (COREG) 25 MG tablet Take 1 tablet (25 mg total) by mouth 2 (two) times daily with a meal. 02/21/21 04/22/21 Yes Elodia Florence., MD  furosemide (LASIX) 40 MG tablet Take 1 tablet (40 mg total) by mouth daily. 02/21/21  04/22/21 Yes Elodia Florence., MD  glipiZIDE (GLUCOTROL XL) 10 MG 24 hr tablet Take 1 tablet (10 mg total) by mouth daily with breakfast. 01/19/21  Yes Gherghe, Vella Redhead, MD  hydrALAZINE (APRESOLINE) 100 MG tablet Take 1 tablet (100 mg total) by mouth 3 (three) times daily. 02/21/21 04/22/21 Yes Elodia Florence., MD  insulin glargine (LANTUS SOLOSTAR) 100 UNIT/ML Solostar Pen Inject 5 Units into the skin daily at 10 pm. 01/19/21 01/19/22 Yes Gherghe, Vella Redhead, MD  isosorbide mononitrate (IMDUR) 60 MG 24 hr  tablet Take 1 tablet (60 mg total) by mouth daily. 02/21/21 04/22/21 Yes Elodia Florence., MD  Multiple Vitamins-Minerals (MENS ONE DAILY PO) Take 1 tablet by mouth daily.   Yes [provider]  valsartan (DIOVAN) 40 MG tablet Take 1 tablet (40 mg total) by mouth daily. 02/26/21  Yes [name], Kardie, DO  Vitamin D, Ergocalciferol, (DRISDOL) 1.25 MG (50000 UNIT) CAPS capsule Take 1 capsule (50,000 Units total) by mouth every 7 (seven) days. 04/25/19  Yes Azzie Glatter, FNP  albuterol (PROVENTIL) (2.5 MG/3ML) 0.083% nebulizer solution Take 3 mLs (2.5 mg total) by nebulization every 6 (six) hours as needed for wheezing or shortness of breath. Patient not taking: Reported on 01/12/2021 04/19/19   Azzie Glatter, FNP  Blood Glucose Monitoring Suppl (TRUE METRIX AIR GLUCOSE METER) DEVI 1 each by Does not apply route 4 (four) times daily -  before meals and at bedtime. 04/19/19   Azzie Glatter, FNP  glucose blood (TRUE METRIX BLOOD GLUCOSE TEST) test strip Use as instructed Patient taking differently: 1 each by Other route as directed. 04/19/19   Azzie Glatter, FNP  Insulin Pen Needle (PEN NEEDLES) 31G X 6 MM MISC 1 each by Does not apply route at bedtime. Patient not taking: Reported on 02/26/2021 10/05/17   Dorena Dew, FNP  INSULIN SYRINGE .5CC/29G (B-D INS SYR ULTRAFINE .5CC/29G) 29G X 1/2" 0.5 ML MISC 1 each by Does not apply route at bedtime. Patient not taking: Reported on 01/12/2021 04/02/17   Dorena Dew, FNP  Lancets MISC 1 each by Does not apply route 4 (four) times daily -  before meals and at bedtime. Patient not taking: Reported on 01/12/2021 04/19/19   Azzie Glatter, FNP    Physical Exam: Vitals:   04/22/21 1245 04/22/21 1300 04/22/21 1445 04/22/21 1500  BP:  (!) 216/135 (!) 209/131 (!) 206/129  Pulse: 88 79 82 77  Resp:  17  15  Temp:      TempSrc:      SpO2: 95% 98% 96% 98%  Weight:      Height:        Constitutional: NAD, calm,  comfortable Eyes: PERRL, lids and conjunctivae normal ENMT: Bilateral tonsillar hypertrophy with erythema.  Mucous membranes are moist. Posterior pharynx clear of any exudate or lesions. Neck: normal, supple, no masses, no thyromegaly Respiratory: clear to auscultation bilaterally, no wheezing, no crackles. Normal respiratory effort. No accessory muscle use.  Cardiovascular: Regular rate and rhythm, no murmurs / rubs / gallops. No extremity edema. 2+ pedal pulses. No carotid bruits.  Abdomen: Obese, no distention.  No tenderness, no masses palpated. No hepatosplenomegaly. Bowel sounds positive.  Musculoskeletal: no clubbing / cyanosis. No joint deformity upper and lower extremities. Good ROM, no contractures. Normal muscle tone.  Skin: no rashes, lesions, ulcers. No induration Neurologic: CN 2-12 grossly intact. Sensation intact, DTR normal. Strength 5/5 in all 4.  Psychiatric: Normal judgment  and insight. Alert and oriented x 3. Normal mood.   Labs on Admission: I have personally reviewed following labs and imaging studies  CBC: Recent Labs  Lab 04/22/21 1315  HGB 15.6  HCT [id number]    Basic Metabolic Panel: Recent Labs  Lab 04/22/21 1315  NA 138  K 5.0  CL 99  GLUCOSE 152*  BUN 20  CREATININE 1.80*    GFR: Estimated Creatinine Clearance: 65.8 mL/min (A) (by C-G formula based on SCr of 1.8 mg/dL (H)).  Liver Function Tests: No results for input(s): AST, ALT, ALKPHOS, BILITOT, PROT, ALBUMIN in the last 168 hours.  Radiological Exams on Admission: CT Soft Tissue Neck W Contrast  Result Date: 04/22/2021 CLINICAL DATA:  Epiglottitis or tonsillitis suspected. Sore throat for 2 days with cough and muffled voice. Group A strep positive. EXAM: CT NECK WITH CONTRAST TECHNIQUE: Multidetector CT imaging of the neck was performed using the standard protocol following the bolus administration of intravenous contrast. RADIATION DOSE REDUCTION: This exam was performed according to the  departmental dose-optimization program which includes automated exposure control, adjustment of the mA and/or kV according to patient size and/or use of iterative reconstruction technique. CONTRAST:  87mL OMNIPAQUE IOHEXOL 300 MG/ML  SOLN COMPARISON:  CTA head and neck 09/04/2019 FINDINGS: Pharynx and larynx: There is prominent asymmetric enlargement of the right-sided palatine tonsillar soft tissues with surrounding low-density submucosal edema extending more inferiorly in the oropharynx and with milder tonsillar enlargement on the left. A small retropharyngeal effusion measures up to 6 mm in thickness. Tonsillar enlargement moderately narrows the upper oropharyngeal airway. No peritonsillar abscess is evident. There is asymmetric inflammation involving the right parapharyngeal space and posterior right submandibular space. There is no significant epiglottic edema. Salivary glands: No inflammation, mass, or stone. Thyroid: Unremarkable. Lymph nodes: Enlarged level II lymph nodes measure up to 1.5 cm in short axis on the right and 1.3 cm on the left. Mildly enlarged subcentimeter short axis lymph nodes are noted in levels III-V. Vascular: Major vascular structures of the neck are grossly patent. Limited intracranial: Partially visualized small quadrigeminal cistern lipoma. Small right middle cranial fossa arachnoid cyst. Visualized orbits: Unremarkable. Mastoids and visualized paranasal sinuses: Mild mucosal thickening in the right maxillary sinus. Clear mastoid air cells. Skeleton: No acute osseous abnormality or suspicious osseous lesion. Upper chest: No apical lung consolidation or mass. Other: None. IMPRESSION: 1. Right larger than left tonsillar enlargement and regional inflammation consistent with acute tonsillitis/pharyngitis acute tonsillitis including a small retropharyngeal effusion. Definite no abscess. 2. Bilateral cervical lymphadenopathy, likely reactive. Electronically Signed   By: Logan Bores M.D.    On: 04/22/2021 14:03    EKG: Independently reviewed.   Assessment/Plan Principal Problem:   Acute streptococcal tonsillitis Observation/PCU. Monitor respiratory status. Analgesics as needed. Received benzathine penicillin and Solu-Medrol earlier. Recontact ENT if symptoms recur.  Active Problems:   Hypertensive urgency Feeling better. Initially treated with IV metoprolol. Resume carvedilol, amlodipine, hydralazine and ARB. Monitor blood pressure closely.    History of seizures No longer taking medications.    Type 2 diabetes mellitus with other specified complication (HCC) Continue Lantus 5 units daily at bedtime. CBG monitoring with RI SS. Continue Glucotrol XL 10 mg p.o. daily.    Dyslipidemia Resume atorvastatin once able to swallow.    Class 2 obesity Advised on modifications. Follow-up with PCP.    Chronic diastolic heart failure (HCC) No signs of decompensation.   DVT prophylaxis: Lovenox SQ. Code Status:   Full code. Family Communication:  Disposition Plan:   Patient is from:  Home.  Anticipated DC to:  Home.  Anticipated DC date:  04/23/2021.  Anticipated DC barriers: Clinical status.  Consults called:   Admission status:  Observation/PCU.  Severity of Illness: High severity in the setting of severe edema of tonsils and hypertensive urgency.  The patient will stay in the hospital for blood pressure control and close monitoring at the request of ENT.  [name] MD [facility]  How to contact the [facility] Attending or Consulting provider [place] or covering provider during after hours [place], for this patient?   Check the care team in [facility] and look for a) attending/consulting [facility] provider listed and b) the [facility] team listed Log into [email or web address] and use [place]'s universal password to access. If you do not have the password, please contact the hospital operator. Locate the [facility] provider you are looking for under [facility] and page  to a number that you can be directly reached. If you still have difficulty reaching the provider, please page the [facility] (Director on Call) for the Hospitalists listed on amion for assistance.  04/22/2021, 4:19 PM   This document was preparation Dragon voice recognition software and may contain some unintended transcription errors.

## 2021-04-22 NOTE — ED Triage Notes (Addendum)
Patient reports sore throat x2 days with cough. Denies fever and congestion. Muffled voice. Speaking in full sentences. Patient adds he has not taken BP medication in two days.

## 2021-04-22 NOTE — ED Notes (Signed)
ED TO INPATIENT HANDOFF REPORT  S Name/Age/Gender Micheal Levy 41 y.o. male Room/Bed: WA14/WA14  Code Status   Code Status: Full Code  Home/SNF/Other Home Patient oriented to: place, time, self,  Is this baseline? Yes   Triage Complete: Triage complete  Chief Complaint Acute streptococcal tonsillitis [J03.00]  Triage Note Patient reports sore throat x2 days with cough. Denies fever and congestion. Muffled voice. Speaking in full sentences. Patient adds he has not taken BP medication in two days.   Allergies No Known Allergies  Level of Care/Admitting Diagnosis ED Disposition     ED Disposition  Admit   Condition  --   Comment  Hospital Area: [facility] [[id number]]  Level of Care: Progressive [102]  Admit to Progressive based on following criteria: RESPIRATORY PROBLEMS hypoxemic/hypercapnic respiratory failure that is responsive to NIPPV (BiPAP) or High Flow Nasal Cannula (6-80 lpm). Frequent assessment/intervention, no > Q2 hrs < Q4 hrs, to maintain oxygenation and pulmonary hygiene.  May place patient in observation at [facility] or [facility] if equivalent level of care is available:: No  Covid Evaluation: Asymptomatic Screening Protocol (No Symptoms)  Diagnosis: Acute streptococcal tonsillitis [124580]  Admitting Physician: Bobette Mo [[id number]]  Attending Physician: Bobette Mo [[id number]]          B Medical/Surgery History Past Medical History:  Diagnosis Date   Chronic kidney disease    Diabetes mellitus without complication (HCC)    Elevated serum cholesterol 04/2019   Elevated serum creatinine 04/2019   History of seizures    Hyperglycemia    Hypertension    Hypertensive urgency 04/2019   Hypokalemia    Seizures (HCC) 03/2017   x 2    Stroke (HCC) 03/2017   Substance abuse (HCC)    Vitamin D deficiency 04/2019   History reviewed. No pertinent surgical history.   A IV Location/Drains/Wounds Patient  Lines/Drains/Airways Status     Active Line/Drains/Airways     Name Placement date Placement time Site Days   Peripheral IV 04/22/21 20 G Anterior;Distal;Right;Upper Arm 04/22/21  1312  Arm  less than 1            Intake/Output Last 24 hours  Intake/Output Summary (Last 24 hours) at 04/22/2021 2018 Last data filed at 04/22/2021 1739 Gross per 24 hour  Intake 1000 ml  Output --  Net 1000 ml    Labs/Imaging Results for orders placed or performed during the hospital encounter of 04/22/21 (from the past 48 hour(s))  Group A Strep by PCR     Status: Abnormal   Collection Time: 04/22/21  8:57 AM   Specimen: Throat; Sterile Swab  Result Value Ref Range   Group A Strep by PCR DETECTED (A) NOT DETECTED    Comment: Performed at [facility], [address], [place], [place] [address]  I-stat chem 8, ED (not at [facility] or [facility])     Status: Abnormal   Collection Time: 04/22/21  1:15 PM  Result Value Ref Range   Sodium 138 135 - 145 mmol/L   Potassium 5.0 3.5 - 5.1 mmol/L   Chloride 99 98 - 111 mmol/L   BUN 20 6 - 20 mg/dL   Creatinine, Ser 3.79 (H) 0.61 - 1.24 mg/dL   Glucose, Bld 024 (H) 70 - 99 mg/dL    Comment: Glucose reference range applies only to samples taken after fasting for at least 8 hours.   Calcium, Ion 1.12 (L) 1.15 - 1.40 mmol/L   TCO2  30 22 - 32 mmol/L   Hemoglobin 15.6 13.0 - 17.0 g/dL   HCT 16.146.0 09.639.0 - 04.552.0 %  CBG monitoring, ED     Status: Abnormal   Collection Time: 04/22/21  5:46 PM  Result Value Ref Range   Glucose-Capillary 147 (H) 70 - 99 mg/dL    Comment: Glucose reference range applies only to samples taken after fasting for at least 8 hours.  POC CBG, ED     Status: Abnormal   Collection Time: 04/22/21  8:16 PM  Result Value Ref Range   Glucose-Capillary 254 (H) 70 - 99 mg/dL    Comment: Glucose reference range applies only to samples taken after fasting for at least 8 hours.   CT Soft Tissue Neck W Contrast  Result Date:  04/22/2021 CLINICAL DATA:  Epiglottitis or tonsillitis suspected. Sore throat for 2 days with cough and muffled voice. Group A strep positive. EXAM: CT NECK WITH CONTRAST TECHNIQUE: Multidetector CT imaging of the neck was performed using the standard protocol following the bolus administration of intravenous contrast. RADIATION DOSE REDUCTION: This exam was performed according to the departmental dose-optimization program which includes automated exposure control, adjustment of the mA and/or kV according to patient size and/or use of iterative reconstruction technique. CONTRAST:  75mL OMNIPAQUE IOHEXOL 300 MG/ML  SOLN COMPARISON:  CTA head and neck 09/04/2019 FINDINGS: Pharynx and larynx: There is prominent asymmetric enlargement of the right-sided palatine tonsillar soft tissues with surrounding low-density submucosal edema extending more inferiorly in the oropharynx and with milder tonsillar enlargement on the left. A small retropharyngeal effusion measures up to 6 mm in thickness. Tonsillar enlargement moderately narrows the upper oropharyngeal airway. No peritonsillar abscess is evident. There is asymmetric inflammation involving the right parapharyngeal space and posterior right submandibular space. There is no significant epiglottic edema. Salivary glands: No inflammation, mass, or stone. Thyroid: Unremarkable. Lymph nodes: Enlarged level II lymph nodes measure up to 1.5 cm in short axis on the right and 1.3 cm on the left. Mildly enlarged subcentimeter short axis lymph nodes are noted in levels III-V. Vascular: Major vascular structures of the neck are grossly patent. Limited intracranial: Partially visualized small quadrigeminal cistern lipoma. Small right middle cranial fossa arachnoid cyst. Visualized orbits: Unremarkable. Mastoids and visualized paranasal sinuses: Mild mucosal thickening in the right maxillary sinus. Clear mastoid air cells. Skeleton: No acute osseous abnormality or suspicious osseous  lesion. Upper chest: No apical lung consolidation or mass. Other: None. IMPRESSION: 1. Right larger than left tonsillar enlargement and regional inflammation consistent with acute tonsillitis/pharyngitis acute tonsillitis including a small retropharyngeal effusion. Definite no abscess. 2. Bilateral cervical lymphadenopathy, likely reactive. Electronically Signed   By: Sebastian AcheAllen  Grady M.D.   On: 04/22/2021 14:03    Pending Labs Unresulted Labs (From admission, onward)     Start     Ordered   04/29/21 0500  Creatinine, serum  (enoxaparin (LOVENOX)    CrCl >/= 30 ml/min)  Weekly,   R     Comments: while on enoxaparin therapy    04/22/21 1605   04/23/21 0500  Hemoglobin A1c  Tomorrow morning,   R       Comments: To assess prior glycemic control    04/22/21 1617            Vitals/Pain Today's Vitals   04/22/21 1800 04/22/21 1830 04/22/21 1834 04/22/21 1900  BP: (!) 170/108 (!) 141/86  118/84  Pulse: 89 75  81  Resp: 18     Temp:  TempSrc:      SpO2: 99% 98%  100%  Weight:      Height:      PainSc:   0-No pain     Isolation Precautions No active isolations  Medications Medications  hydrALAZINE (APRESOLINE) injection 20 mg (has no administration in time range)  enoxaparin (LOVENOX) injection 40 mg (has no administration in time range)  ondansetron (ZOFRAN) tablet 4 mg (has no administration in time range)    Or  ondansetron (ZOFRAN) injection 4 mg (has no administration in time range)  HYDROmorphone (DILAUDID) injection 1 mg (has no administration in time range)  0.45 % sodium chloride infusion ( Intravenous New Bag/Given 04/22/21 1739)  irbesartan (AVAPRO) tablet 37.5 mg (37.5 mg Oral Given 04/22/21 1734)  isosorbide mononitrate (IMDUR) 24 hr tablet 60 mg (60 mg Oral Given 04/22/21 1734)  glipiZIDE (GLUCOTROL XL) 24 hr tablet 10 mg (has no administration in time range)  furosemide (LASIX) tablet 40 mg (40 mg Oral Given 04/22/21 1732)  atorvastatin (LIPITOR) tablet 10 mg (has  no administration in time range)  aspirin EC tablet 81 mg (has no administration in time range)  albuterol (VENTOLIN HFA) 108 (90 Base) MCG/ACT inhaler 2 puff (has no administration in time range)  amLODipine (NORVASC) tablet 10 mg (10 mg Oral Given 04/22/21 1732)  carvedilol (COREG) tablet 25 mg (25 mg Oral Given 04/22/21 1732)  insulin glargine (LANTUS) Solostar Pen 5 Units (has no administration in time range)  insulin aspart (novoLOG) injection 0-15 Units (2 Units Subcutaneous Given 04/22/21 1828)  hydrALAZINE (APRESOLINE) tablet 100 mg (100 mg Oral Given 04/22/21 1732)  penicillin g benzathine (BICILLIN LA) 1200000 UNIT/2ML injection 1.2 Million Units (1.2 Million Units Intramuscular Given 04/22/21 1046)  methylPREDNISolone sodium succinate (SOLU-MEDROL) 125 mg/2 mL injection 125 mg (125 mg Intravenous Given 04/22/21 1044)  sodium chloride 0.9 % bolus 1,000 mL (0 mLs Intravenous Stopped 04/22/21 1739)  ketorolac (TORADOL) 15 MG/ML injection 15 mg (15 mg Intravenous Given 04/22/21 1045)  metoprolol tartrate (LOPRESSOR) injection 5 mg (5 mg Intravenous Given 04/22/21 1044)  iohexol (OMNIPAQUE) 300 MG/ML solution 75 mL (75 mLs Intravenous Contrast Given 04/22/21 1333)  sodium chloride (PF) 0.9 % injection (  Given by Other 04/22/21 1337)    Mobility walks Low fall risk   Focused Assessments Blood sugar   R Recommendations: See Admitting Provider Note  Report given to:   Additional Notes:

## 2021-04-22 NOTE — ED Provider Notes (Signed)
[facility] DEPT Provider Note   CSN: [place]:[id number] Arrival date & time: 04/22/21  0844     History  Chief Complaint  Patient presents with   Sore Throat    Micheal Levy is a 41 y.o. male.  Pt is a 41 yo male presenting with throat pain. Pt admits to throat pain and fevers. States he has been unable to take home meds x 3 days due to pain with swallowing. Denies drooling or difficulty tolerating secretions. Denies sob.   The history is provided by the patient. No language interpreter was used.  Sore Throat Pertinent negatives include no chest pain, no abdominal pain and no shortness of breath.      Home Medications Prior to Admission medications   Medication Sig Start Date End Date Taking? Authorizing Provider  albuterol (PROVENTIL) (2.5 MG/3ML) 0.083% nebulizer solution Take 3 mLs (2.5 mg total) by nebulization every 6 (six) hours as needed for wheezing or shortness of breath. Patient not taking: Reported on 01/12/2021 04/19/19   Azzie Glatter, FNP  albuterol (VENTOLIN HFA) 108 (90 Base) MCG/ACT inhaler Inhale 2 puffs into the lungs every 6 (six) hours as needed for wheezing or shortness of breath. 04/19/19   Azzie Glatter, FNP  amLODipine (NORVASC) 10 MG tablet Take 1 tablet (10 mg total) by mouth daily. 02/21/21 04/22/21  Elodia Florence., MD  aspirin EC 81 MG tablet Take 1 tablet (81 mg total) by mouth daily. 09/13/19   Lavina Hamman, MD  atorvastatin (LIPITOR) 10 MG tablet Take 1 tablet (10 mg total) by mouth daily. 02/21/21 04/22/21  Elodia Florence., MD  Blood Glucose Monitoring Suppl (TRUE METRIX AIR GLUCOSE METER) DEVI 1 each by Does not apply route 4 (four) times daily -  before meals and at bedtime. 04/19/19   Azzie Glatter, FNP  carvedilol (COREG) 25 MG tablet Take 1 tablet (25 mg total) by mouth 2 (two) times daily with a meal. 02/21/21 04/22/21  Elodia Florence., MD  furosemide (LASIX) 40 MG tablet Take 1 tablet (40  mg total) by mouth daily. 02/21/21 04/22/21  Elodia Florence., MD  glipiZIDE (GLUCOTROL XL) 10 MG 24 hr tablet Take 1 tablet (10 mg total) by mouth daily with breakfast. 01/19/21   Caren Griffins, MD  glucose blood (TRUE METRIX BLOOD GLUCOSE TEST) test strip Use as instructed Patient taking differently: 1 each by Other route as directed. 04/19/19   Azzie Glatter, FNP  hydrALAZINE (APRESOLINE) 100 MG tablet Take 1 tablet (100 mg total) by mouth 3 (three) times daily. 02/21/21 04/22/21  Elodia Florence., MD  insulin glargine (LANTUS SOLOSTAR) 100 UNIT/ML Solostar Pen Inject 5 Units into the skin daily at 10 pm. 01/19/21 01/19/22  Caren Griffins, MD  Insulin Pen Needle (PEN NEEDLES) 31G X 6 MM MISC 1 each by Does not apply route at bedtime. Patient not taking: Reported on 02/26/2021 10/05/17   Dorena Dew, FNP  INSULIN SYRINGE .5CC/29G (B-D INS SYR ULTRAFINE .5CC/29G) 29G X 1/2" 0.5 ML MISC 1 each by Does not apply route at bedtime. Patient not taking: Reported on 01/12/2021 04/02/17   Dorena Dew, FNP  isosorbide mononitrate (IMDUR) 60 MG 24 hr tablet Take 1 tablet (60 mg total) by mouth daily. 02/21/21 04/22/21  Elodia Florence., MD  Lancets MISC 1 each by Does not apply route 4 (four) times daily -  before meals and at bedtime. Patient  not taking: Reported on 01/12/2021 04/19/19   Azzie Glatter, FNP  Multiple Vitamins-Minerals (MENS ONE DAILY PO) Take 1 tablet by mouth daily.    [provider]  valsartan (DIOVAN) 40 MG tablet Take 1 tablet (40 mg total) by mouth daily. 02/26/21   [name], Kardie, DO  Vitamin D, Ergocalciferol, (DRISDOL) 1.25 MG (50000 UNIT) CAPS capsule Take 1 capsule (50,000 Units total) by mouth every 7 (seven) days. 04/25/19   Azzie Glatter, FNP      Allergies    Patient has no known allergies.    Review of Systems   Review of Systems  Constitutional:  Negative for chills and fever.  HENT:  Positive for sore throat and voice  change. Negative for ear pain.   Eyes:  Negative for pain and visual disturbance.  Respiratory:  Negative for cough and shortness of breath.   Cardiovascular:  Negative for chest pain and palpitations.  Gastrointestinal:  Negative for abdominal pain and vomiting.  Genitourinary:  Negative for dysuria and hematuria.  Musculoskeletal:  Negative for arthralgias and back pain.  Skin:  Negative for color change and rash.  Neurological:  Negative for seizures and syncope.  All other systems reviewed and are negative.  Physical Exam Updated Vital Signs BP (!) 216/135    Pulse 79    Temp 99.8 F (37.7 C) (Oral)    Resp 17    Ht 5\' 8"  (1.727 m)    Wt 114.8 kg    SpO2 98%    BMI 38.47 kg/m  Physical Exam Vitals and nursing note reviewed.  Constitutional:      General: He is not in acute distress.    Appearance: He is well-developed.  HENT:     Head: Normocephalic and atraumatic.     Comments: Muffled voice    Mouth/Throat:     Lips: Pink.     Mouth: Mucous membranes are moist.     Tonsils: 3+ on the right. 3+ on the left.  Eyes:     Conjunctiva/sclera: Conjunctivae normal.  Cardiovascular:     Rate and Rhythm: Normal rate and regular rhythm.     Heart sounds: No murmur heard. Pulmonary:     Effort: Pulmonary effort is normal. No respiratory distress.     Breath sounds: Normal breath sounds.  Abdominal:     Palpations: Abdomen is soft.     Tenderness: There is no abdominal tenderness.  Musculoskeletal:        General: No swelling.     Cervical back: Neck supple.  Skin:    General: Skin is warm and dry.     Capillary Refill: Capillary refill takes less than 2 seconds.  Neurological:     Mental Status: He is alert.  Psychiatric:        Mood and Affect: Mood normal.    ED Results / Procedures / Treatments   Labs (all labs ordered are listed, but only abnormal results are displayed) Labs Reviewed  GROUP A STREP BY PCR - Abnormal; Notable for the following components:       Result Value   Group A Strep by PCR DETECTED (*)    All other components within normal limits  I-STAT CHEM 8, ED - Abnormal; Notable for the following components:   Creatinine, Ser 1.80 (*)    Glucose, Bld 152 (*)    Calcium, Ion 1.12 (*)    All other components within normal limits    EKG None  Radiology CT Soft Tissue  Neck W Contrast  Result Date: 04/22/2021 CLINICAL DATA:  Epiglottitis or tonsillitis suspected. Sore throat for 2 days with cough and muffled voice. Group A strep positive. EXAM: CT NECK WITH CONTRAST TECHNIQUE: Multidetector CT imaging of the neck was performed using the standard protocol following the bolus administration of intravenous contrast. RADIATION DOSE REDUCTION: This exam was performed according to the departmental dose-optimization program which includes automated exposure control, adjustment of the mA and/or kV according to patient size and/or use of iterative reconstruction technique. CONTRAST:  63mL OMNIPAQUE IOHEXOL 300 MG/ML  SOLN COMPARISON:  CTA head and neck 09/04/2019 FINDINGS: Pharynx and larynx: There is prominent asymmetric enlargement of the right-sided palatine tonsillar soft tissues with surrounding low-density submucosal edema extending more inferiorly in the oropharynx and with milder tonsillar enlargement on the left. A small retropharyngeal effusion measures up to 6 mm in thickness. Tonsillar enlargement moderately narrows the upper oropharyngeal airway. No peritonsillar abscess is evident. There is asymmetric inflammation involving the right parapharyngeal space and posterior right submandibular space. There is no significant epiglottic edema. Salivary glands: No inflammation, mass, or stone. Thyroid: Unremarkable. Lymph nodes: Enlarged level II lymph nodes measure up to 1.5 cm in short axis on the right and 1.3 cm on the left. Mildly enlarged subcentimeter short axis lymph nodes are noted in levels III-V. Vascular: Major vascular structures of the  neck are grossly patent. Limited intracranial: Partially visualized small quadrigeminal cistern lipoma. Small right middle cranial fossa arachnoid cyst. Visualized orbits: Unremarkable. Mastoids and visualized paranasal sinuses: Mild mucosal thickening in the right maxillary sinus. Clear mastoid air cells. Skeleton: No acute osseous abnormality or suspicious osseous lesion. Upper chest: No apical lung consolidation or mass. Other: None. IMPRESSION: 1. Right larger than left tonsillar enlargement and regional inflammation consistent with acute tonsillitis/pharyngitis acute tonsillitis including a small retropharyngeal effusion. Definite no abscess. 2. Bilateral cervical lymphadenopathy, likely reactive. Electronically Signed   By: Logan Bores M.D.   On: 04/22/2021 14:03    Procedures Procedures    Medications Ordered in ED Medications  penicillin g benzathine (BICILLIN LA) 1200000 UNIT/2ML injection 1.2 Million Units (1.2 Million Units Intramuscular Given 04/22/21 1046)  methylPREDNISolone sodium succinate (SOLU-MEDROL) 125 mg/2 mL injection 125 mg (125 mg Intravenous Given 04/22/21 1044)  sodium chloride 0.9 % bolus 1,000 mL (1,000 mLs Intravenous New Bag/Given 04/22/21 1046)  ketorolac (TORADOL) 15 MG/ML injection 15 mg (15 mg Intravenous Given 04/22/21 1045)  metoprolol tartrate (LOPRESSOR) injection 5 mg (5 mg Intravenous Given 04/22/21 1044)  iohexol (OMNIPAQUE) 300 MG/ML solution 75 mL (75 mLs Intravenous Contrast Given 04/22/21 1333)  sodium chloride (PF) 0.9 % injection (  Given by Other 04/22/21 1337)    ED Course/ Medical Decision Making/ A&P                           Medical Decision Making Amount and/or Complexity of Data Reviewed Radiology: ordered.  Risk Prescription drug management. Decision regarding hospitalization.   41 yo male presenting for sore throat. On exam pt has low grade fever, hypertensive at 236/146. Muffled, "hot-potatoe" voice. No drooling. No hypoxia. Positive  for strep. Toradol, Penicillin G, IV fluids started. CT neck demonstrates enlarged tonsils with retropharyngeal fluid. No abscess. I spoke with on call ENT Dr. Melrose Nakayama who recommends no ENT needed but admit for IV meds and obs of airway. Pt agreeable with plan.   Pt agreeable with plan.        Final Clinical Impression(s) /  ED Diagnoses Final diagnoses:  Pharyngitis due to Streptococcus species    Rx / DC Orders ED Discharge Orders     None         Lianne Cure, DO [id number]

## 2021-04-23 DIAGNOSIS — I5032 Chronic diastolic (congestive) heart failure: Secondary | ICD-10-CM

## 2021-04-23 DIAGNOSIS — J0300 Acute streptococcal tonsillitis, unspecified: Secondary | ICD-10-CM | POA: Diagnosis not present

## 2021-04-23 DIAGNOSIS — Z794 Long term (current) use of insulin: Secondary | ICD-10-CM

## 2021-04-23 DIAGNOSIS — E785 Hyperlipidemia, unspecified: Secondary | ICD-10-CM | POA: Diagnosis not present

## 2021-04-23 DIAGNOSIS — E669 Obesity, unspecified: Secondary | ICD-10-CM | POA: Diagnosis not present

## 2021-04-23 DIAGNOSIS — Z87898 Personal history of other specified conditions: Secondary | ICD-10-CM

## 2021-04-23 DIAGNOSIS — E1169 Type 2 diabetes mellitus with other specified complication: Secondary | ICD-10-CM

## 2021-04-23 LAB — CBC WITH DIFFERENTIAL/PLATELET
Abs Immature Granulocytes: 0.23 10*3/uL — ABNORMAL HIGH (ref 0.00–0.07)
Basophils Absolute: 0 10*3/uL (ref 0.0–0.1)
Basophils Relative: 0 %
Eosinophils Absolute: 0 10*3/uL (ref 0.0–0.5)
Eosinophils Relative: 0 %
HCT: 40.3 % (ref 39.0–52.0)
Hemoglobin: 12.6 g/dL — ABNORMAL LOW (ref 13.0–17.0)
Immature Granulocytes: 1 %
Lymphocytes Relative: 9 %
Lymphs Abs: 2 10*3/uL (ref 0.7–4.0)
MCH: 25 pg — ABNORMAL LOW (ref 26.0–34.0)
MCHC: 31.3 g/dL (ref 30.0–36.0)
MCV: 80.1 fL (ref 80.0–100.0)
Monocytes Absolute: 1.7 10*3/uL — ABNORMAL HIGH (ref 0.1–1.0)
Monocytes Relative: 8 %
Neutro Abs: 18.4 10*3/uL — ABNORMAL HIGH (ref 1.7–7.7)
Neutrophils Relative %: 82 %
Platelets: 316 10*3/uL (ref 150–400)
RBC: 5.03 MIL/uL (ref 4.22–5.81)
RDW: 15.9 % — ABNORMAL HIGH (ref 11.5–15.5)
WBC: 22.3 10*3/uL — ABNORMAL HIGH (ref 4.0–10.5)
nRBC: 0 % (ref 0.0–0.2)

## 2021-04-23 LAB — GLUCOSE, CAPILLARY
Glucose-Capillary: 156 mg/dL — ABNORMAL HIGH (ref 70–99)
Glucose-Capillary: 168 mg/dL — ABNORMAL HIGH (ref 70–99)
Glucose-Capillary: 112 mg/dL — ABNORMAL HIGH (ref 70–99)
Glucose-Capillary: 138 mg/dL — ABNORMAL HIGH (ref 70–99)

## 2021-04-23 LAB — HEMOGLOBIN A1C
Hgb A1c MFr Bld: 7.3 % — ABNORMAL HIGH (ref 4.8–5.6)
Mean Plasma Glucose: 162.81 mg/dL

## 2021-04-23 LAB — BASIC METABOLIC PANEL
Anion gap: 9 (ref 5–15)
BUN: 28 mg/dL — ABNORMAL HIGH (ref 6–20)
CO2: 24 mmol/L (ref 22–32)
Calcium: 8.7 mg/dL — ABNORMAL LOW (ref 8.9–10.3)
Chloride: 102 mmol/L (ref 98–111)
Creatinine, Ser: 2.2 mg/dL — ABNORMAL HIGH (ref 0.61–1.24)
GFR, Estimated: 37 mL/min — ABNORMAL LOW (ref >60)
Glucose, Bld: 175 mg/dL — ABNORMAL HIGH (ref 70–99)
Potassium: 3.2 mmol/L — ABNORMAL LOW (ref 3.5–5.1)
Sodium: 135 mmol/L (ref 135–145)

## 2021-04-23 MED ORDER — POTASSIUM CHLORIDE CRYS ER 20 MEQ PO TBCR
40.0000 meq | EXTENDED_RELEASE_TABLET | ORAL | Status: AC
  Administered 2021-04-23 (×2): 40 meq via ORAL
  Filled 2021-04-23 (×2): qty 2

## 2021-04-23 MED ORDER — ORAL CARE MOUTH RINSE
15.0000 mL | Freq: Two times a day (BID) | OROMUCOSAL | Status: DC
  Administered 2021-04-23 – 2021-04-24 (×3): 15 mL via OROMUCOSAL

## 2021-04-23 NOTE — Progress Notes (Signed)
PROGRESS NOTE  ANDRO GROSSER [id number] DOB: 30-Dec-1980 DOA: 04/22/2021 PCP: [name], NP  Brief History   COVEN DYCE is a 41 y.o. male with medical history significant of CKD, type II DM, hypertension, history of seizures no longer on antiepileptic medication, history of other nonhemorrhagic stroke, history of polysubstance abuse, vitamin D deficiency, class II obesity who is coming to the emergency department with complaints of severe sore throat with odynophagia when trying to eat.  He has been unable to take his medications in the past 2 days due to pain.  No fever, chills, but positive malaise and cervical adenopathies.  No chest pain, palpitations, dyspnea, wheezing or hemoptysis.  No diaphoresis, PND, orthopnea or or recent pitting edema of the lower extremities.  Denied nausea, vomiting, diarrhea, constipation, melena hematochezia.  No dysuria, frequency or hematuria.   ED Course: Initial vital signs were temperature Initial vital signs temperature 98.8 F, pulse 102, respirations 18, BP 236/146 mmHg O2 sat 96% on room air.  The patient received 125 mg of Solu-Medrol, 1,200,000 units of penicillin G IM, metoprolol 5 mg IVP, Toradol 15 mg IVP x1 and 1000 mL of NS bolus.    [facility] were consulted to admit the patient for further evaluation and treatment.   Today the patient's throat is less painful, but he has a WBC count of 22.3. Will transition to oral augmentin and monitor WBC and patient status overnight. Consultants    Procedures    Antibiotics   Anti-infectives (From admission, onward)    Start     Dose/Rate Route Frequency Ordered Stop   04/22/21 1015  penicillin g benzathine (BICILLIN LA) 1200000 UNIT/2ML injection 1.2 Million Units        1.2 Million Units Intramuscular  Once 04/22/21 1011 04/22/21 1046        Subjective  The patient is resting comfortably. He states that his throat is less painful. Will try oral antibiotics.  Objective    Vitals:  Vitals:   04/23/21 0819 04/23/21 1345  BP: (!) 152/90 134/77  Pulse:  80  Resp:  14  Temp:  98.2 F (36.8 C)  SpO2:  96%    Exam:  Constitutional:  The patient is awake, alert, and oriented x 3. No acute distress. Neck:  neck appears normal, no masses, normal ROM, supple no thyromegaly Positive for lymphadenopathy Respiratory:  No increased work of breathing. No wheezes, rales, or rhonchi No tactile fremitus Cardiovascular:  Regular rate and rhythm No murmurs, ectopy, or gallups. No lateral PMI. No thrills. Abdomen:  Abdomen is soft, non-tender, non-distended No hernias, masses, or organomegaly Normoactive bowel sounds.  Musculoskeletal:  No cyanosis, clubbing, or edema Skin:  No rashes, lesions, ulcers palpation of skin: no induration or nodules Neurologic:  CN 2-12 intact Sensation all 4 extremities intact Psychiatric:  Mental status Mood, affect appropriate Orientation to person, place, time  judgment and insight appear intact   I have personally reviewed the following:   Today's Data  vitals  Lab Data  CBC BMP  Micro Data  Group A strep detected by PCR  Imaging    Cardiology Data    Other Data    Scheduled Meds:  amLODipine  10 mg Oral Daily   aspirin EC  81 mg Oral Daily   atorvastatin  10 mg Oral Daily   carvedilol  25 mg Oral BID WC   enoxaparin (LOVENOX) injection  40 mg Subcutaneous Q24H   furosemide  40 mg Oral Daily  glipiZIDE  10 mg Oral Q breakfast   hydrALAZINE  100 mg Oral TID   insulin aspart  0-15 Units Subcutaneous TID WC   insulin glargine-yfgn  5 Units Subcutaneous QHS   irbesartan  37.5 mg Oral Daily   isosorbide mononitrate  60 mg Oral Daily   mouth rinse  15 mL Mouth Rinse BID   potassium chloride  40 mEq Oral Q4H   Continuous Infusions:  sodium chloride Stopped (04/23/21 0156)    Principal Problem:   Acute streptococcal tonsillitis Active Problems:   Hypertensive urgency   History of  seizures   Type 2 diabetes mellitus with other specified complication (HCC)   Dyslipidemia   Class 2 obesity   Chronic diastolic heart failure ([place])   LOS: 0 days   A & P    Acute streptococcal tonsillitis Observation/PCU. Monitor respiratory status. Analgesics as needed. Received benzathine penicillin and Solu-Medrol earlier. Recontact ENT if symptoms recur.   Active Problems:   Hypertensive urgency Feeling better. Initially treated with IV metoprolol. Resume carvedilol, amlodipine, hydralazine and ARB. Monitor blood pressure closely.     History of seizures No longer taking medications.     Type 2 diabetes mellitus with other specified complication (HCC) Continue Lantus 5 units daily at bedtime. CBG monitoring with RI SS. Continue Glucotrol XL 10 mg p.o. daily.     Dyslipidemia Resume atorvastatin once able to swallow.     Class 2 obesity Advised on modifications. Follow-up with PCP.     Chronic diastolic heart failure (HCC) No signs of decompensation.  I have seen and examined this patient myself. I have spent 35 minutes in his evaluation and care.   DVT prophylaxis:      Lovenox SQ. Code Status:              Full code. Family Communication:        Disposition Plan:              Patient is from:                        Home.             Anticipated DC to:                   Home.             Anticipated DC date:               04/23/2021.             Anticipated DC barriers:         Clinical status.             Ryker Sudbury, DO [facility] Direct contact: see [email or web address]  7PM-7AM contact night coverage as above 04/23/2021, 6:32 PM  LOS: 0 days

## 2021-04-23 NOTE — Progress Notes (Signed)
Pt admitted to the unit from ED. Pt A&O x4, up ad lib in room. Pt oriented to the unit and room call light. VSS, telemetry applied and verified with CCMD, NT called to second verify. Pt skin dry and intact with no pressure ulcer or opened wound noted. Pt denies any discomfort and rested well during shift. BP prn medication adm x1 effective. Reported off to oncoming. Micheal Bucy RN

## 2021-04-23 NOTE — TOC Initial Note (Signed)
Transition of Care [facility]) - Initial/Assessment Note    Patient Details  Name: Micheal Levy MRN: [id number] Date of Birth: 1981/04/01  Transition of Care [facility]) CM/SW Contact:    Leeroy Cha, RN Phone Number: 04/23/2021, 8:44 AM  Clinical Narrative:                  Transition of Care [facility]) Screening Note   Patient Details  Name: Micheal Levy Date of Birth: Jun 28, 1980   [facility]) CM/SW Contact:    Leeroy Cha, RN Phone Number: 04/23/2021, 8:44 AM    Transition of Care Department (TOC) has reviewed patient and no TOC needs have been identified at this time. We will continue to monitor patient advancement through interdisciplinary progression rounds. If new patient transition needs arise, please place a TOC consult.    Expected Discharge Plan: Home/Self Care Barriers to Discharge: Continued Medical Work up   Patient Goals and CMS Choice Patient states their goals for this hospitalization and ongoing recovery are:: to go home CMS Medicare.gov Compare Post Acute Care list provided to:: Patient    Expected Discharge Plan and Services Expected Discharge Plan: Home/Self Care   Discharge Planning Services: CM Consult   Living arrangements for the past 2 months: Apartment                                      Prior Living Arrangements/Services Living arrangements for the past 2 months: Apartment Lives with:: Self Patient language and need for interpreter reviewed:: Yes Do you feel safe going back to the place where you live?: Yes            Criminal Activity/Legal Involvement Pertinent to Current Situation/Hospitalization: No - Comment as needed  Activities of Daily Living      Permission Sought/Granted                  Emotional Assessment Appearance:: Appears stated age Attitude/Demeanor/Rapport: Engaged Affect (typically observed): Calm Orientation: : Oriented to Place, Oriented to Self, Oriented to  Time,  Oriented to Situation Alcohol / Substance Use: Not Applicable Psych Involvement: No (comment)  Admission diagnosis:  Acute streptococcal tonsillitis [J03.00] Pharyngitis due to Streptococcus species [J02.0] Patient Active Problem List   Diagnosis Date Noted   Acute streptococcal tonsillitis 04/22/2021   Chronic diastolic heart failure ([place]) 04/22/2021   Uncontrolled hypertension 02/26/2021   Medication management 02/26/2021   Class 2 obesity 02/26/2021   Stage 3a chronic kidney disease ([place]) 02/26/2021   CHF exacerbation ([place]) 02/20/2021   Type 2 diabetes mellitus with other specified complication ([place]) 0000000   Dyslipidemia 02/20/2021   Abnormal EKG 02/20/2021   Acute respiratory failure with hypoxia ([place]) 01/12/2021   Chronic kidney disease, stage 3a ([place])    Acute diastolic CHF (congestive heart failure) (HCC)    Elevated troponin    History of CVA (cerebrovascular accident)    Ankle fracture, bimalleolar, closed, left, initial encounter 01/18/2020   Seizures ([place]) 09/04/2019   Altered mental status    Acute hypoxemic respiratory failure ([place])    Hypertensive urgency 04/21/2019   Essential hypertension 04/21/2019   Uncontrolled type 2 diabetes mellitus with hyperglycemia ([place]) 04/21/2019   Hemoglobin A1C between 7% and 9% indicating borderline diabetic control ([place]) 04/21/2019   Incarceration 04/21/2019   History of seizures 04/21/2019   Hypokalemia 04/21/2019   History of substance abuse ([place]) 04/05/2017  Cerebral embolism with cerebral infarction 03/23/2017   Acute renal failure (ARF) ([place])    Hypertensive emergency    Ventilator dependent ([place])    Hyperglycemia 03/21/2017   PCP:  [name], NP Pharmacy:   [facility] [address] [place] [address] Phone: [phone] Fax: [place], [place] [place] [place] [address] Phone: [phone] Fax: [phone]     Social Determinants of Health (SDOH) Interventions    Readmission Risk Interventions No flowsheet data found.

## 2021-04-24 DIAGNOSIS — J0300 Acute streptococcal tonsillitis, unspecified: Secondary | ICD-10-CM | POA: Diagnosis not present

## 2021-04-24 DIAGNOSIS — E669 Obesity, unspecified: Secondary | ICD-10-CM | POA: Diagnosis not present

## 2021-04-24 DIAGNOSIS — I5032 Chronic diastolic (congestive) heart failure: Secondary | ICD-10-CM | POA: Diagnosis not present

## 2021-04-24 DIAGNOSIS — E785 Hyperlipidemia, unspecified: Secondary | ICD-10-CM | POA: Diagnosis not present

## 2021-04-24 LAB — BASIC METABOLIC PANEL
Anion gap: 10 (ref 5–15)
BUN: 43 mg/dL — ABNORMAL HIGH (ref 6–20)
CO2: 24 mmol/L (ref 22–32)
Calcium: 8.7 mg/dL — ABNORMAL LOW (ref 8.9–10.3)
Chloride: 104 mmol/L (ref 98–111)
Creatinine, Ser: 2.73 mg/dL — ABNORMAL HIGH (ref 0.61–1.24)
GFR, Estimated: 29 mL/min — ABNORMAL LOW (ref >60)
Glucose, Bld: 70 mg/dL (ref 70–99)
Potassium: 4.1 mmol/L (ref 3.5–5.1)
Sodium: 138 mmol/L (ref 135–145)

## 2021-04-24 LAB — CBC WITH DIFFERENTIAL/PLATELET
Abs Immature Granulocytes: 0.15 10*3/uL — ABNORMAL HIGH (ref 0.00–0.07)
Basophils Absolute: 0.1 10*3/uL (ref 0.0–0.1)
Basophils Relative: 0 %
Eosinophils Absolute: 0.1 10*3/uL (ref 0.0–0.5)
Eosinophils Relative: 0 %
HCT: 39.5 % (ref 39.0–52.0)
Hemoglobin: 12.3 g/dL — ABNORMAL LOW (ref 13.0–17.0)
Immature Granulocytes: 1 %
Lymphocytes Relative: 17 %
Lymphs Abs: 2.5 10*3/uL (ref 0.7–4.0)
MCH: 25.1 pg — ABNORMAL LOW (ref 26.0–34.0)
MCHC: 31.1 g/dL (ref 30.0–36.0)
MCV: 80.6 fL (ref 80.0–100.0)
Monocytes Absolute: 1.6 10*3/uL — ABNORMAL HIGH (ref 0.1–1.0)
Monocytes Relative: 11 %
Neutro Abs: 10.8 10*3/uL — ABNORMAL HIGH (ref 1.7–7.7)
Neutrophils Relative %: 71 %
Platelets: 333 10*3/uL (ref 150–400)
RBC: 4.9 MIL/uL (ref 4.22–5.81)
RDW: 15.9 % — ABNORMAL HIGH (ref 11.5–15.5)
WBC: 15.2 10*3/uL — ABNORMAL HIGH (ref 4.0–10.5)
nRBC: 0 % (ref 0.0–0.2)

## 2021-04-24 LAB — GLUCOSE, CAPILLARY
Glucose-Capillary: 94 mg/dL (ref 70–99)
Glucose-Capillary: 169 mg/dL — ABNORMAL HIGH (ref 70–99)

## 2021-04-24 NOTE — Discharge Summary (Signed)
Physician Discharge Summary  [name] [id number] DOB: 10-21-1980 DOA: 04/22/2021  PCP: [name], NP  Admit date: 04/22/2021 Discharge date: 04/24/2021  Time spent: 30 minutes  Recommendations for Outpatient Follow-up: Acute streptococcal tonsillitis -Patient received penicillin G 1,200,000 units intramuscular as a one-time dose.       Acute streptococcal tonsillitis Observation/PCU. Monitor respiratory status. Analgesics as needed. Received benzathine penicillin and Solu-Medrol earlier. Recontact ENT if symptoms recur.       Hypertensive urgency Feeling better. Initially treated with IV metoprolol. Resume carvedilol, amlodipine, hydralazine and ARB. Monitor blood pressure closely.     History of seizures No longer taking medications.     Type 2 diabetes mellitus with other specified complication (HCC) Continue Lantus 5 units daily at bedtime. CBG monitoring with RI SS. Continue Glucotrol XL 10 mg p.o. daily.     Dyslipidemia Resume atorvastatin once able to swallow.     Class 2 obesity Advised on modifications. Follow-up with PCP.     Chronic diastolic heart failure (HCC) No signs of decompensation.   Discharge Diagnoses:  Principal Problem:   Acute streptococcal tonsillitis Active Problems:   Hypertensive urgency   History of seizures   Type 2 diabetes mellitus with other specified complication (HCC)   Dyslipidemia   Class 2 obesity   Chronic diastolic heart failure [facility])   Discharge Condition: Stable  Diet recommendation: Carb modified   Filed Weights   04/22/21 1017 04/22/21 2045  Weight: 114.8 kg 103.4 kg    History of present illness:  41 y.o. BM PMHx CKD, type II DM, hypertension, history of seizures no longer on antiepileptic medication, history of other nonhemorrhagic stroke, history of polysubstance abuse, vitamin D deficiency, class II obesity   Coming to the emergency department with complaints of severe sore throat with  odynophagia when trying to eat.  He has been unable to take his medications in the past 2 days due to pain.  No fever, chills, but positive malaise and cervical adenopathies.  No chest pain, palpitations, dyspnea, wheezing or hemoptysis.  No diaphoresis, PND, orthopnea or or recent pitting edema of the lower extremities.  Denied nausea, vomiting, diarrhea, constipation, melena hematochezia.  No dysuria, frequency or hematuria.   ED Course: Initial vital signs were temperature Initial vital signs temperature 98.8 F, pulse 102, respirations 18, BP 236/146 mmHg O2 sat 96% on room air.  The patient received 125 mg of Solu-Medrol, 1,200,000 units of penicillin G IM, metoprolol 5 mg IVP, Toradol 15 mg IVP x1 and 1000 mL of NS bolus.     [facility] were consulted to admit the patient for further evaluation and treatment.    Today the patient's throat is less painful, but he has a WBC count of 22.3. Will transition to oral augmentin and monitor WBC and patient status overnight  Hospital Course:  See above   Cultures  1/16 group A strep by PCR  Antibiotics Anti-infectives (From admission, onward)    Start     Ordered Stop   04/22/21 1015  penicillin g benzathine (BICILLIN LA) 1200000 UNIT/2ML injection 1.2 Million Units        04/22/21 1011 04/22/21 1046         Discharge Exam: Vitals:   04/23/21 1345 04/23/21 2054 04/24/21 0545 04/24/21 1319  BP: 134/77 137/74 (!) 151/101 (!) 143/81  Pulse: 80 76 63 (!) 56  Resp: 14 17 17 20   Temp: 98.2 F (36.8 C) 97.8 F (36.6 C) 98.4 F (36.9 C) 97.6  F (36.4 C)  TempSrc: Oral Oral Oral Oral  SpO2: 96% 98% 99% 100%  Weight:      Height:        General: No acute respiratory distress Eyes: negative scleral hemorrhage, negative anisocoria, negative icterus ENT: Negative Runny nose, negative gingival bleeding, Neck:  Negative scars, masses, torticollis, lymphadenopathy, JVD Lungs: Clear to auscultation bilaterally without wheezes or  crackles Cardiovascular: Regular rate and rhythm without murmur gallop or rub normal S1 and S2  Discharge Instructions   Allergies as of 04/24/2021   No Known Allergies      Medication List     TAKE these medications    albuterol 108 (90 Base) MCG/ACT inhaler Commonly known as: VENTOLIN HFA Inhale 2 puffs into the lungs every 6 (six) hours as needed for wheezing or shortness of breath. What changed: Another medication with the same name was removed. Continue taking this medication, and follow the directions you see here.   amLODipine 10 MG tablet Commonly known as: NORVASC Take 1 tablet (10 mg total) by mouth daily.   aspirin EC 81 MG tablet Take 1 tablet (81 mg total) by mouth daily.   atorvastatin 10 MG tablet Commonly known as: LIPITOR Take 1 tablet (10 mg total) by mouth daily.   carvedilol 25 MG tablet Commonly known as: COREG Take 1 tablet (25 mg total) by mouth 2 (two) times daily with a meal.   furosemide 40 MG tablet Commonly known as: Lasix Take 1 tablet (40 mg total) by mouth daily.   glipiZIDE 10 MG 24 hr tablet Commonly known as: Glucotrol XL Take 1 tablet (10 mg total) by mouth daily with breakfast.   hydrALAZINE 100 MG tablet Commonly known as: APRESOLINE Take 1 tablet (100 mg total) by mouth 3 (three) times daily.   INSULIN SYRINGE .5CC/29G 29G X 1/2" 0.5 ML Misc Commonly known as: B-D INS SYR ULTRAFINE .5CC/29G 1 each by Does not apply route at bedtime.   isosorbide mononitrate 60 MG 24 hr tablet Commonly known as: IMDUR Take 1 tablet (60 mg total) by mouth daily.   Lancets Misc 1 each by Does not apply route 4 (four) times daily -  before meals and at bedtime.   Lantus SoloStar 100 UNIT/ML Solostar Pen Generic drug: insulin glargine Inject 5 Units into the skin daily at 10 pm.   MENS ONE DAILY PO Take 1 tablet by mouth daily.   Pen Needles 31G X 6 MM Misc 1 each by Does not apply route at bedtime.   True Metrix Air Glucose Meter  Devi 1 each by Does not apply route 4 (four) times daily -  before meals and at bedtime.   True Metrix Blood Glucose Test test strip Generic drug: glucose blood Use as instructed What changed:  how much to take how to take this when to take this additional instructions   valsartan 40 MG tablet Commonly known as: Diovan Take 1 tablet (40 mg total) by mouth daily.   Vitamin D (Ergocalciferol) 1.25 MG (50000 UNIT) Caps capsule Commonly known as: DRISDOL Take 1 capsule (50,000 Units total) by mouth every 7 (seven) days.       No Known Allergies    The results of significant diagnostics from this hospitalization (including imaging, microbiology, ancillary and laboratory) are listed below for reference.    Significant Diagnostic Studies: CT Soft Tissue Neck W Contrast  Result Date: 04/22/2021 CLINICAL DATA:  Epiglottitis or tonsillitis suspected. Sore throat for 2 days with cough and muffled  voice. Group A strep positive. EXAM: CT NECK WITH CONTRAST TECHNIQUE: Multidetector CT imaging of the neck was performed using the standard protocol following the bolus administration of intravenous contrast. RADIATION DOSE REDUCTION: This exam was performed according to the departmental dose-optimization program which includes automated exposure control, adjustment of the mA and/or kV according to patient size and/or use of iterative reconstruction technique. CONTRAST:  40mL OMNIPAQUE IOHEXOL 300 MG/ML  SOLN COMPARISON:  CTA head and neck 09/04/2019 FINDINGS: Pharynx and larynx: There is prominent asymmetric enlargement of the right-sided palatine tonsillar soft tissues with surrounding low-density submucosal edema extending more inferiorly in the oropharynx and with milder tonsillar enlargement on the left. A small retropharyngeal effusion measures up to 6 mm in thickness. Tonsillar enlargement moderately narrows the upper oropharyngeal airway. No peritonsillar abscess is evident. There is asymmetric  inflammation involving the right parapharyngeal space and posterior right submandibular space. There is no significant epiglottic edema. Salivary glands: No inflammation, mass, or stone. Thyroid: Unremarkable. Lymph nodes: Enlarged level II lymph nodes measure up to 1.5 cm in short axis on the right and 1.3 cm on the left. Mildly enlarged subcentimeter short axis lymph nodes are noted in levels III-V. Vascular: Major vascular structures of the neck are grossly patent. Limited intracranial: Partially visualized small quadrigeminal cistern lipoma. Small right middle cranial fossa arachnoid cyst. Visualized orbits: Unremarkable. Mastoids and visualized paranasal sinuses: Mild mucosal thickening in the right maxillary sinus. Clear mastoid air cells. Skeleton: No acute osseous abnormality or suspicious osseous lesion. Upper chest: No apical lung consolidation or mass. Other: None. IMPRESSION: 1. Right larger than left tonsillar enlargement and regional inflammation consistent with acute tonsillitis/pharyngitis acute tonsillitis including a small retropharyngeal effusion. Definite no abscess. 2. Bilateral cervical lymphadenopathy, likely reactive. Electronically Signed   By: Logan Bores M.D.   On: 04/22/2021 14:03    Microbiology: Recent Results (from the past 240 hour(s))  Group A Strep by PCR     Status: Abnormal   Collection Time: 04/22/21  8:57 AM   Specimen: Throat; Sterile Swab  Result Value Ref Range Status   Group A Strep by PCR DETECTED (A) NOT DETECTED Final    Comment: Performed at [facility], [place] [address], [place], [place] [address]     Labs: Basic Metabolic Panel: Recent Labs  Lab 04/22/21 1315 04/23/21 1047 04/24/21 0339  NA 138 135 138  K 5.0 3.2* 4.1  CL 99 102 104  CO2  --  24 24  GLUCOSE 152* 175* 70  BUN 20 28* 43*  CREATININE 1.80* 2.20* 2.73*  CALCIUM  --  8.7* 8.7*   Liver Function Tests: No results for input(s): AST, ALT, ALKPHOS, BILITOT, PROT,  ALBUMIN in the last 168 hours. No results for input(s): LIPASE, AMYLASE in the last 168 hours. No results for input(s): AMMONIA in the last 168 hours. CBC: Recent Labs  Lab 04/22/21 1315 04/23/21 1047 04/24/21 0520  WBC  --  22.3* 15.2*  NEUTROABS  --  18.4* 10.8*  HGB 15.6 12.6* 12.3*  HCT 46.0 40.3 39.5  MCV  --  80.1 80.6  PLT  --  316 333   Cardiac Enzymes: No results for input(s): CKTOTAL, CKMB, CKMBINDEX, TROPONINI in the last 168 hours. BNP: BNP (last 3 results) Recent Labs    01/12/21 0202 02/19/21 2211  BNP 853.5* 518.4*    ProBNP (last 3 results) No results for input(s): PROBNP in the last 8760 hours.  CBG: Recent Labs  Lab 04/23/21 1143 04/23/21  1627 04/23/21 2057 04/24/21 0739 04/24/21 1149  GLUCAP 168* 112* 138* 94 169*       Signed:  Dia Crawford, MD [facility]

## 2021-04-24 NOTE — Progress Notes (Incomplete)
PROGRESS NOTE    Micheal Levy  UJW:[id number] DOB: [birth year] DOA: 04/22/2021 PCP: Barbette Merino, NP     Brief Narrative:  41 y.o. male with medical history significant of CKD, type II DM, hypertension, history of seizures no longer on antiepileptic medication, history of other nonhemorrhagic stroke, history of polysubstance abuse, vitamin D deficiency, class II obesity who is coming to the emergency department with complaints of severe sore throat with odynophagia when trying to eat.  He has been unable to take his medications in the past 2 days due to pain.  No fever, chills, but positive malaise and cervical adenopathies.  No chest pain, palpitations, dyspnea, wheezing or hemoptysis.  No diaphoresis, PND, orthopnea or or recent pitting edema of the lower extremities.  Denied nausea, vomiting, diarrhea, constipation, melena hematochezia.  No dysuria, frequency or hematuria.   ED Course: Initial vital signs were temperature Initial vital signs temperature 98.8 F, pulse 102, respirations 18, BP 236/146 mmHg O2 sat 96% on room air.  The patient received 125 mg of Solu-Medrol, 1,200,000 units of penicillin G IM, metoprolol 5 mg IVP, Toradol 15 mg IVP x1 and 1000 mL of NS bolus.     [facility] were consulted to admit the patient for further evaluation and treatment.    Today the patient's throat is less painful, but he has a WBC count of 22.3. Will transition to oral augmentin and monitor WBC and patient status overnight.   Subjective: ***   Assessment & Plan: Covid vaccination;   Principal Problem:   Acute streptococcal tonsillitis Active Problems:   Hypertensive urgency   History of seizures   Type 2 diabetes mellitus with other specified complication (HCC)   Dyslipidemia   Class 2 obesity   Chronic diastolic heart failure (HCC)   Acute streptococcal tonsillitis -Patient received penicillin G 1,200,000 units intramuscular as a one-time dose.     Acute  streptococcal tonsillitis Observation/PCU. Monitor respiratory status. Analgesics as needed. Received benzathine penicillin and Solu-Medrol earlier. Recontact ENT if symptoms recur.      Hypertensive urgency Feeling better. Initially treated with IV metoprolol. Resume carvedilol, amlodipine, hydralazine and ARB. Monitor blood pressure closely.     History of seizures No longer taking medications.     Type 2 diabetes mellitus with other specified complication (HCC) Continue Lantus 5 units daily at bedtime. CBG monitoring with RI SS. Continue Glucotrol XL 10 mg p.o. daily.     Dyslipidemia Resume atorvastatin once able to swallow.     Class 2 obesity Advised on modifications. Follow-up with PCP.     Chronic diastolic heart failure (HCC) No signs of decompensation.         Body mass index is 34.66 kg/m.  ***   DVT prophylaxis: *** Code Status: *** Family Communication: *** Status is: Inpatient  {Inpatient:23812}  Dispo: The patient is from: {From:23814}              Anticipated d/c is to: {To:23815}              Anticipated d/c date is: {Days:23816}              Patient currently {Medically stable:23817}      Consultants:  ***  Procedures/Significant Events:  ***  I have personally reviewed and interpreted all radiology studies and my findings are as above.  VENTILATOR SETTINGS: ***   Cultures ***  Antimicrobials: Anti-infectives (From admission, onward)    Start     Dose/Rate Ordered Stop  04/22/21 1015  penicillin g benzathine (BICILLIN LA) 1200000 UNIT/2ML injection 1.2 Million Units        1.2 Million Units 04/22/21 1011 04/22/21 1046         Devices ***   LINES / TUBES:  ***    Continuous Infusions:  sodium chloride Stopped (04/23/21 0156)     Objective: Vitals:   04/23/21 0819 04/23/21 1345 04/23/21 2054 04/24/21 0545  BP: (!) 152/90 134/77 137/74 (!) 151/101  Pulse:  80 76 63  Resp:  14 17 17   Temp:  98.2 F  (36.8 C) 97.8 F (36.6 C) 98.4 F (36.9 C)  TempSrc:  Oral Oral Oral  SpO2:  96% 98% 99%  Weight:      Height:        Intake/Output Summary (Last 24 hours) at 04/24/2021 1043 Last data filed at 04/23/2021 2101 Gross per 24 hour  Intake 240 ml  Output --  Net 240 ml   Filed Weights   04/22/21 1017 04/22/21 2045  Weight: 114.8 kg 103.4 kg    Examination:  General: No acute respiratory distress Eyes: negative scleral hemorrhage, negative anisocoria, negative icterus*** ENT: Negative Runny nose, negative gingival bleeding,*** Neck:  Negative scars, masses, torticollis, lymphadenopathy, JVD*** Lungs: Clear to auscultation bilaterally without wheezes or crackles Cardiovascular: Regular rate and rhythm without murmur gallop or rub normal S1 and S2 Abdomen: negative abdominal pain, nondistended, positive soft, bowel sounds, no rebound, no ascites, no appreciable mass Extremities: No significant cyanosis, clubbing, or edema bilateral lower extremities Skin: Negative rashes, lesions, ulcers*** Psychiatric:  Negative depression, negative anxiety, negative fatigue, negative mania *** Central nervous system:  Cranial nerves II through XII intact, tongue/uvula midline, all extremities muscle strength 5/5, sensation intact throughout, finger nose finger bilateral within normal limits, quick finger touch bilateral within normal limits, negative Romberg sign, heel to shin bilateral within normal limits, standing on 1 foot bilateral within normal limits, walking on tiptoes within normal limits, walking on heels within normal limits, negative dysarthria, negative expressive aphasia, negative receptive aphasia.***  .     Data Reviewed: Care during the described time interval was provided by me .  I have reviewed this patient's available data, including medical history, events of note, physical examination, and all test results as part of my evaluation.  CBC: Recent Labs  Lab 04/22/21 1315  04/23/21 1047 04/24/21 0520  WBC  --  22.3* 15.2*  NEUTROABS  --  18.4* 10.8*  HGB 15.6 12.6* 12.3*  HCT 46.0 40.3 39.5  MCV  --  80.1 80.6  PLT  --  316 0000000   Basic Metabolic Panel: Recent Labs  Lab 04/22/21 1315 04/23/21 1047 04/24/21 0339  NA 138 135 138  K 5.0 3.2* 4.1  CL 99 102 104  CO2  --  24 24  GLUCOSE 152* 175* 70  BUN 20 28* 43*  CREATININE 1.80* 2.20* 2.73*  CALCIUM  --  8.7* 8.7*   GFR: Estimated Creatinine Clearance: 41.1 mL/min (A) (by C-G formula based on SCr of 2.73 mg/dL (H)). Liver Function Tests: No results for input(s): AST, ALT, ALKPHOS, BILITOT, PROT, ALBUMIN in the last 168 hours. No results for input(s): LIPASE, AMYLASE in the last 168 hours. No results for input(s): AMMONIA in the last 168 hours. Coagulation Profile: No results for input(s): INR, PROTIME in the last 168 hours. Cardiac Enzymes: No results for input(s): CKTOTAL, CKMB, CKMBINDEX, TROPONINI in the last 168 hours. BNP (last 3 results) No results for input(s): PROBNP in  the last 8760 hours. HbA1C: Recent Labs    04/23/21 0337  HGBA1C 7.3*   CBG: Recent Labs  Lab 04/23/21 0725 04/23/21 1143 04/23/21 1627 04/23/21 2057 04/24/21 0739  GLUCAP 156* 168* 112* 138* 94   Lipid Profile: No results for input(s): CHOL, HDL, LDLCALC, TRIG, CHOLHDL, LDLDIRECT in the last 72 hours. Thyroid Function Tests: No results for input(s): TSH, T4TOTAL, FREET4, T3FREE, THYROIDAB in the last 72 hours. Anemia Panel: No results for input(s): VITAMINB12, FOLATE, FERRITIN, TIBC, IRON, RETICCTPCT in the last 72 hours. Sepsis Labs: No results for input(s): PROCALCITON, LATICACIDVEN in the last 168 hours.  Recent Results (from the past 240 hour(s))  Group A Strep by PCR     Status: Abnormal   Collection Time: 04/22/21  8:57 AM   Specimen: Throat; Sterile Swab  Result Value Ref Range Status   Group A Strep by PCR DETECTED (A) NOT DETECTED Final    Comment: Performed at [facility], [place] [address], [place], [place] [address]         Radiology Studies: CT Soft Tissue Neck W Contrast  Result Date: 04/22/2021 CLINICAL DATA:  Epiglottitis or tonsillitis suspected. Sore throat for 2 days with cough and muffled voice. Group A strep positive. EXAM: CT NECK WITH CONTRAST TECHNIQUE: Multidetector CT imaging of the neck was performed using the standard protocol following the bolus administration of intravenous contrast. RADIATION DOSE REDUCTION: This exam was performed according to the departmental dose-optimization program which includes automated exposure control, adjustment of the mA and/or kV according to patient size and/or use of iterative reconstruction technique. CONTRAST:  75mL OMNIPAQUE IOHEXOL 300 MG/ML  SOLN COMPARISON:  CTA head and neck 09/04/2019 FINDINGS: Pharynx and larynx: There is prominent asymmetric enlargement of the right-sided palatine tonsillar soft tissues with surrounding low-density submucosal edema extending more inferiorly in the oropharynx and with milder tonsillar enlargement on the left. A small retropharyngeal effusion measures up to 6 mm in thickness. Tonsillar enlargement moderately narrows the upper oropharyngeal airway. No peritonsillar abscess is evident. There is asymmetric inflammation involving the right parapharyngeal space and posterior right submandibular space. There is no significant epiglottic edema. Salivary glands: No inflammation, mass, or stone. Thyroid: Unremarkable. Lymph nodes: Enlarged level II lymph nodes measure up to 1.5 cm in short axis on the right and 1.3 cm on the left. Mildly enlarged subcentimeter short axis lymph nodes are noted in levels III-V. Vascular: Major vascular structures of the neck are grossly patent. Limited intracranial: Partially visualized small quadrigeminal cistern lipoma. Small right middle cranial fossa arachnoid cyst. Visualized orbits: Unremarkable. Mastoids and visualized paranasal sinuses: Mild  mucosal thickening in the right maxillary sinus. Clear mastoid air cells. Skeleton: No acute osseous abnormality or suspicious osseous lesion. Upper chest: No apical lung consolidation or mass. Other: None. IMPRESSION: 1. Right larger than left tonsillar enlargement and regional inflammation consistent with acute tonsillitis/pharyngitis acute tonsillitis including a small retropharyngeal effusion. Definite no abscess. 2. Bilateral cervical lymphadenopathy, likely reactive. Electronically Signed   By: Logan Bores M.D.   On: 04/22/2021 14:03        Scheduled Meds:  amLODipine  10 mg Oral Daily   aspirin EC  81 mg Oral Daily   atorvastatin  10 mg Oral Daily   carvedilol  25 mg Oral BID WC   enoxaparin (LOVENOX) injection  40 mg Subcutaneous Q24H   furosemide  40 mg Oral Daily   glipiZIDE  10 mg Oral Q breakfast   hydrALAZINE  100 mg  Oral TID   insulin aspart  0-15 Units Subcutaneous TID WC   insulin glargine-yfgn  5 Units Subcutaneous QHS   irbesartan  37.5 mg Oral Daily   isosorbide mononitrate  60 mg Oral Daily   mouth rinse  15 mL Mouth Rinse BID   Continuous Infusions:  sodium chloride Stopped (04/23/21 0156)     LOS: 0 days    Time spent:40 min***    Leevon Upperman, Geraldo Docker, MD [facility]   If 7PM-7AM, please contact night-coverage 04/24/2021, 10:43 AM

## 2021-04-24 NOTE — Plan of Care (Signed)
°  Problem: Clinical Measurements: Goal: Respiratory complications will improve Outcome: Adequate for Discharge   Problem: Clinical Measurements: Goal: Ability to maintain clinical measurements within normal limits will improve Outcome: Adequate for Discharge   Problem: Clinical Measurements: Goal: Cardiovascular complication will be avoided Outcome: Adequate for Discharge

## 2021-05-07 ENCOUNTER — Ambulatory Visit (HOSPITAL_BASED_OUTPATIENT_CLINIC_OR_DEPARTMENT_OTHER): Payer: Medicaid Other | Admitting: Cardiovascular Disease

## 2021-05-08 ENCOUNTER — Encounter (HOSPITAL_COMMUNITY): Payer: Self-pay

## 2021-05-08 ENCOUNTER — Inpatient Hospital Stay (HOSPITAL_COMMUNITY)
Admission: EM | Admit: 2021-05-08 | Discharge: 2021-05-11 | DRG: 291 | Disposition: A | Discharge status: Home / Self Care | Payer: Medicaid Other | Attending: Internal Medicine | Admitting: Internal Medicine

## 2021-05-08 ENCOUNTER — Emergency Department (HOSPITAL_COMMUNITY): Payer: Medicaid Other

## 2021-05-08 DIAGNOSIS — E785 Hyperlipidemia, unspecified: Secondary | ICD-10-CM | POA: Diagnosis present

## 2021-05-08 DIAGNOSIS — E1122 Type 2 diabetes mellitus with diabetic chronic kidney disease: Secondary | ICD-10-CM | POA: Diagnosis present

## 2021-05-08 DIAGNOSIS — Z8249 Family history of ischemic heart disease and other diseases of the circulatory system: Secondary | ICD-10-CM

## 2021-05-08 DIAGNOSIS — Z7982 Long term (current) use of aspirin: Secondary | ICD-10-CM

## 2021-05-08 DIAGNOSIS — E559 Vitamin D deficiency, unspecified: Secondary | ICD-10-CM | POA: Diagnosis present

## 2021-05-08 DIAGNOSIS — Z20822 Contact with and (suspected) exposure to covid-19: Secondary | ICD-10-CM | POA: Diagnosis present

## 2021-05-08 DIAGNOSIS — I509 Heart failure, unspecified: Secondary | ICD-10-CM

## 2021-05-08 DIAGNOSIS — Z833 Family history of diabetes mellitus: Secondary | ICD-10-CM

## 2021-05-08 DIAGNOSIS — I5033 Acute on chronic diastolic (congestive) heart failure: Secondary | ICD-10-CM

## 2021-05-08 DIAGNOSIS — N1831 Chronic kidney disease, stage 3a: Secondary | ICD-10-CM

## 2021-05-08 DIAGNOSIS — E1169 Type 2 diabetes mellitus with other specified complication: Secondary | ICD-10-CM

## 2021-05-08 DIAGNOSIS — R609 Edema, unspecified: Secondary | ICD-10-CM | POA: Diagnosis not present

## 2021-05-08 DIAGNOSIS — N179 Acute kidney failure, unspecified: Secondary | ICD-10-CM

## 2021-05-08 DIAGNOSIS — Z7984 Long term (current) use of oral hypoglycemic drugs: Secondary | ICD-10-CM

## 2021-05-08 DIAGNOSIS — Z8673 Personal history of transient ischemic attack (TIA), and cerebral infarction without residual deficits: Secondary | ICD-10-CM

## 2021-05-08 DIAGNOSIS — I130 Hypertensive heart and chronic kidney disease with heart failure and stage 1 through stage 4 chronic kidney disease, or unspecified chronic kidney disease: Principal | ICD-10-CM | POA: Diagnosis present

## 2021-05-08 DIAGNOSIS — Z79899 Other long term (current) drug therapy: Secondary | ICD-10-CM

## 2021-05-08 DIAGNOSIS — N189 Chronic kidney disease, unspecified: Secondary | ICD-10-CM

## 2021-05-08 DIAGNOSIS — I10 Essential (primary) hypertension: Secondary | ICD-10-CM

## 2021-05-08 DIAGNOSIS — Z87891 Personal history of nicotine dependence: Secondary | ICD-10-CM

## 2021-05-08 LAB — CBC WITH DIFFERENTIAL/PLATELET
Abs Immature Granulocytes: 0.02 10*3/uL (ref 0.00–0.07)
Basophils Absolute: 0.1 10*3/uL (ref 0.0–0.1)
Basophils Relative: 1 %
Eosinophils Absolute: 0.2 10*3/uL (ref 0.0–0.5)
Eosinophils Relative: 2 %
HCT: 33.1 % — ABNORMAL LOW (ref 39.0–52.0)
Hemoglobin: 10.4 g/dL — ABNORMAL LOW (ref 13.0–17.0)
Immature Granulocytes: 0 %
Lymphocytes Relative: 22 %
Lymphs Abs: 1.5 10*3/uL (ref 0.7–4.0)
MCH: 25.2 pg — ABNORMAL LOW (ref 26.0–34.0)
MCHC: 31.4 g/dL (ref 30.0–36.0)
MCV: 80.3 fL (ref 80.0–100.0)
Monocytes Absolute: 0.7 10*3/uL (ref 0.1–1.0)
Monocytes Relative: 10 %
Neutro Abs: 4.6 10*3/uL (ref 1.7–7.7)
Neutrophils Relative %: 65 %
Platelets: 405 10*3/uL — ABNORMAL HIGH (ref 150–400)
RBC: 4.12 MIL/uL — ABNORMAL LOW (ref 4.22–5.81)
RDW: 16.5 % — ABNORMAL HIGH (ref 11.5–15.5)
WBC: 7 10*3/uL (ref 4.0–10.5)
nRBC: 0 % (ref 0.0–0.2)

## 2021-05-08 LAB — COMPREHENSIVE METABOLIC PANEL
ALT: 17 U/L (ref 0–44)
AST: 19 U/L (ref 15–41)
Albumin: 3.1 g/dL — ABNORMAL LOW (ref 3.5–5.0)
Alkaline Phosphatase: 73 U/L (ref 38–126)
Anion gap: 7 (ref 5–15)
BUN: 76 mg/dL — ABNORMAL HIGH (ref 6–20)
CO2: 22 mmol/L (ref 22–32)
Calcium: 8.9 mg/dL (ref 8.9–10.3)
Chloride: 108 mmol/L (ref 98–111)
Creatinine, Ser: 3.35 mg/dL — ABNORMAL HIGH (ref 0.61–1.24)
GFR, Estimated: 23 mL/min — ABNORMAL LOW (ref >60)
Glucose, Bld: 128 mg/dL — ABNORMAL HIGH (ref 70–99)
Potassium: 4.3 mmol/L (ref 3.5–5.1)
Sodium: 137 mmol/L (ref 135–145)
Total Bilirubin: 1 mg/dL (ref 0.3–1.2)
Total Protein: 7.8 g/dL (ref 6.5–8.1)

## 2021-05-08 LAB — TROPONIN I (HIGH SENSITIVITY)
Troponin I (High Sensitivity): 24 ng/L — ABNORMAL HIGH (ref <18)
Troponin I (High Sensitivity): 23 ng/L — ABNORMAL HIGH (ref <18)

## 2021-05-08 LAB — RESP PANEL BY RT-PCR (FLU A&B, COVID) ARPGX2
Influenza A by PCR: NEGATIVE
Influenza B by PCR: NEGATIVE
SARS Coronavirus 2 by RT PCR: NEGATIVE

## 2021-05-08 LAB — BRAIN NATRIURETIC PEPTIDE: B Natriuretic Peptide: 959.9 pg/mL — ABNORMAL HIGH (ref 0.0–100.0)

## 2021-05-08 MED ORDER — ONDANSETRON HCL 4 MG PO TABS: 4.0000 mg | ORAL_TABLET | Freq: Four times a day (QID) | ORAL | Status: DC | PRN

## 2021-05-08 MED ORDER — ACETAMINOPHEN 650 MG RE SUPP: 650.0000 mg | Freq: Four times a day (QID) | RECTAL | Status: DC | PRN

## 2021-05-08 MED ORDER — SENNOSIDES-DOCUSATE SODIUM 8.6-50 MG PO TABS: 1.0000 | ORAL_TABLET | Freq: Every evening | ORAL | Status: DC | PRN

## 2021-05-08 MED ORDER — ONDANSETRON HCL 4 MG/2ML IJ SOLN: 4.0000 mg | Freq: Four times a day (QID) | INTRAMUSCULAR | Status: DC | PRN

## 2021-05-08 MED ORDER — INSULIN GLARGINE-YFGN 100 UNIT/ML ~~LOC~~ SOLN
5.0000 [IU] | Freq: Every day | SUBCUTANEOUS | Status: DC
  Administered 2021-05-09 – 2021-05-10 (×3): 5 [IU] via SUBCUTANEOUS
  Filled 2021-05-08 (×4): qty 0.05

## 2021-05-08 MED ORDER — ISOSORBIDE MONONITRATE ER 60 MG PO TB24
60.0000 mg | ORAL_TABLET | Freq: Every day | ORAL | Status: DC
  Administered 2021-05-09 – 2021-05-11 (×3): 60 mg via ORAL
  Filled 2021-05-08 (×4): qty 1

## 2021-05-08 MED ORDER — CARVEDILOL 25 MG PO TABS
25.0000 mg | ORAL_TABLET | Freq: Two times a day (BID) | ORAL | Status: DC
  Administered 2021-05-09 – 2021-05-11 (×6): 25 mg via ORAL
  Filled 2021-05-08 (×2): qty 2
  Filled 2021-05-08: qty 1
  Filled 2021-05-08 (×2): qty 2
  Filled 2021-05-08: qty 1

## 2021-05-08 MED ORDER — HEPARIN SODIUM (PORCINE) 5000 UNIT/ML IJ SOLN
5000.0000 [IU] | Freq: Three times a day (TID) | INTRAMUSCULAR | Status: DC
  Administered 2021-05-08 – 2021-05-11 (×8): 5000 [IU] via SUBCUTANEOUS
  Filled 2021-05-08 (×8): qty 1

## 2021-05-08 MED ORDER — HYDRALAZINE HCL 20 MG/ML IJ SOLN
10.0000 mg | INTRAMUSCULAR | Status: DC | PRN
  Administered 2021-05-08: 10 mg via INTRAVENOUS
  Filled 2021-05-08: qty 1

## 2021-05-08 MED ORDER — SODIUM CHLORIDE 0.9% FLUSH
3.0000 mL | Freq: Two times a day (BID) | INTRAVENOUS | Status: DC
  Administered 2021-05-08 – 2021-05-11 (×6): 3 mL via INTRAVENOUS

## 2021-05-08 MED ORDER — ASPIRIN EC 81 MG PO TBEC
81.0000 mg | DELAYED_RELEASE_TABLET | Freq: Every day | ORAL | Status: DC
  Administered 2021-05-09 – 2021-05-11 (×3): 81 mg via ORAL
  Filled 2021-05-08 (×3): qty 1

## 2021-05-08 MED ORDER — FUROSEMIDE 10 MG/ML IJ SOLN
40.0000 mg | Freq: Two times a day (BID) | INTRAMUSCULAR | Status: DC
  Administered 2021-05-08 – 2021-05-11 (×6): 40 mg via INTRAVENOUS
  Filled 2021-05-08 (×6): qty 4

## 2021-05-08 MED ORDER — INSULIN ASPART 100 UNIT/ML IJ SOLN
0.0000 [IU] | Freq: Three times a day (TID) | INTRAMUSCULAR | Status: DC
  Administered 2021-05-09 – 2021-05-10 (×3): 1 [IU] via SUBCUTANEOUS
  Filled 2021-05-08: qty 0.09

## 2021-05-08 MED ORDER — ALBUTEROL SULFATE (2.5 MG/3ML) 0.083% IN NEBU: 2.5000 mg | INHALATION_SOLUTION | Freq: Four times a day (QID) | RESPIRATORY_TRACT | Status: DC | PRN

## 2021-05-08 MED ORDER — ATORVASTATIN CALCIUM 10 MG PO TABS
10.0000 mg | ORAL_TABLET | Freq: Every day | ORAL | Status: DC
  Administered 2021-05-09 – 2021-05-11 (×3): 10 mg via ORAL
  Filled 2021-05-08 (×3): qty 1

## 2021-05-08 MED ORDER — AMLODIPINE BESYLATE 10 MG PO TABS
10.0000 mg | ORAL_TABLET | Freq: Every day | ORAL | Status: DC
  Administered 2021-05-09 – 2021-05-11 (×3): 10 mg via ORAL
  Filled 2021-05-08 (×2): qty 2
  Filled 2021-05-08: qty 1

## 2021-05-08 MED ORDER — HYDRALAZINE HCL 50 MG PO TABS
100.0000 mg | ORAL_TABLET | Freq: Three times a day (TID) | ORAL | Status: DC
  Administered 2021-05-09 – 2021-05-11 (×8): 100 mg via ORAL
  Filled 2021-05-08 (×9): qty 2

## 2021-05-08 MED ORDER — HYDRALAZINE HCL 20 MG/ML IJ SOLN
10.0000 mg | Freq: Once | INTRAMUSCULAR | Status: AC
  Administered 2021-05-08: 10 mg via INTRAVENOUS
  Filled 2021-05-08: qty 1

## 2021-05-08 MED ORDER — ACETAMINOPHEN 325 MG PO TABS: 650.0000 mg | ORAL_TABLET | Freq: Four times a day (QID) | ORAL | Status: DC | PRN

## 2021-05-08 MED ORDER — INSULIN GLARGINE 100 UNIT/ML SOLOSTAR PEN: 5.0000 [IU] | PEN_INJECTOR | Freq: Every day | SUBCUTANEOUS | Status: DC

## 2021-05-08 NOTE — Assessment & Plan Note (Addendum)
Presenting with orthopnea, DOE, PND, bilateral lower extremity swelling, asymmetric pulmonary edema on CXR, and elevated BNP consistent with acute on chronic diastolic CHF exacerbation. TTE 01/12/2021 showed EF 55-60% with G3 DD. -Continue on IV Lasix 40 mg twice daily -Monitor strict I/O's and daily weights -Unfortunately, he is in the emergency room for the last 2 days, no output is recorded.

## 2021-05-08 NOTE — Assessment & Plan Note (Addendum)
Creatinine 3.35 on admission compared to recent baseline 2.0-2.2.  Suspect secondary to volume overload in setting of acute on chronic HFpEF. -Monitor renal function with diuresis -Hold valsartan -Slight improvement of renal functions. -Renal ultrasound with no hydronephrosis or obstruction.  Shows advanced medical renal disease.

## 2021-05-08 NOTE — Assessment & Plan Note (Addendum)
Chronic uncontrolled hypertension.  Better today. -Amlodipine 10 mg daily -Coreg 25 mg twice daily -Hydralazine 100 mg 3 times daily -Imdur 60 mg daily

## 2021-05-08 NOTE — ED Provider Notes (Signed)
[facility] DEPT Provider Note   CSN: [id number] Arrival date & time: 05/08/21  1455     History  Chief Complaint  Patient presents with   Shortness of Breath    Micheal Levy is a 41 y.o. male with a past medical history of uncontrolled hypertension, DM, CKD, stroke, seizures, CHF, dyslipidemia, who presents today for evaluation of shortness of breath and swelling. He states that he used to be on a clonidine patch, however he ran out of that 1 to 2 weeks ago. He reports compliance with all of his other medications stating that he took them today. He states that he feels like his right leg is more swollen than his left.  He denies any fevers.  He has a dry cough.    Denies any chest pain, however reported in triage that he felt like his chest was tight..  No sick contacts.  HPI     Home Medications Prior to Admission medications   Medication Sig Start Date End Date Taking? Authorizing Provider  albuterol (VENTOLIN HFA) 108 (90 Base) MCG/ACT inhaler Inhale 2 puffs into the lungs every 6 (six) hours as needed for wheezing or shortness of breath. 04/19/19   Azzie Glatter, FNP  amLODipine (NORVASC) 10 MG tablet Take 1 tablet (10 mg total) by mouth daily. 02/21/21 04/22/21  Elodia Florence., MD  aspirin EC 81 MG tablet Take 1 tablet (81 mg total) by mouth daily. 09/13/19   Lavina Hamman, MD  atorvastatin (LIPITOR) 10 MG tablet Take 1 tablet (10 mg total) by mouth daily. 02/21/21 04/22/21  Elodia Florence., MD  Blood Glucose Monitoring Suppl (TRUE METRIX AIR GLUCOSE METER) DEVI 1 each by Does not apply route 4 (four) times daily -  before meals and at bedtime. 04/19/19   Azzie Glatter, FNP  carvedilol (COREG) 25 MG tablet Take 1 tablet (25 mg total) by mouth 2 (two) times daily with a meal. 02/21/21 04/22/21  Elodia Florence., MD  furosemide (LASIX) 40 MG tablet Take 1 tablet (40 mg total) by mouth daily. 02/21/21 04/22/21  Elodia Florence., MD  glipiZIDE (GLUCOTROL XL) 10 MG 24 hr tablet Take 1 tablet (10 mg total) by mouth daily with breakfast. 01/19/21   Caren Griffins, MD  glucose blood (TRUE METRIX BLOOD GLUCOSE TEST) test strip Use as instructed Patient taking differently: 1 each by Other route as directed. 04/19/19   Azzie Glatter, FNP  hydrALAZINE (APRESOLINE) 100 MG tablet Take 1 tablet (100 mg total) by mouth 3 (three) times daily. 02/21/21 04/22/21  Elodia Florence., MD  insulin glargine (LANTUS SOLOSTAR) 100 UNIT/ML Solostar Pen Inject 5 Units into the skin daily at 10 pm. 01/19/21 01/19/22  Caren Griffins, MD  Insulin Pen Needle (PEN NEEDLES) 31G X 6 MM MISC 1 each by Does not apply route at bedtime. Patient not taking: Reported on 02/26/2021 10/05/17   Dorena Dew, FNP  INSULIN SYRINGE .5CC/29G (B-D INS SYR ULTRAFINE .5CC/29G) 29G X 1/2" 0.5 ML MISC 1 each by Does not apply route at bedtime. Patient not taking: Reported on 01/12/2021 04/02/17   Dorena Dew, FNP  isosorbide mononitrate (IMDUR) 60 MG 24 hr tablet Take 1 tablet (60 mg total) by mouth daily. 02/21/21 04/22/21  Elodia Florence., MD  Lancets MISC 1 each by Does not apply route 4 (four) times daily -  before meals and at bedtime. Patient not taking:  Reported on 01/12/2021 04/19/19   Azzie Glatter, FNP  Multiple Vitamins-Minerals (MENS ONE DAILY PO) Take 1 tablet by mouth daily.    [provider]  valsartan (DIOVAN) 40 MG tablet Take 1 tablet (40 mg total) by mouth daily. 02/26/21   [name], Kardie, DO  Vitamin D, Ergocalciferol, (DRISDOL) 1.25 MG (50000 UNIT) CAPS capsule Take 1 capsule (50,000 Units total) by mouth every 7 (seven) days. 04/25/19   Azzie Glatter, FNP      Allergies    Patient has no known allergies.     Physical Exam Updated Vital Signs BP (!) 164/104    Pulse 75    Temp 98.1 F (36.7 C) (Oral)    Resp (!) 26    SpO2 97%  Physical Exam Vitals and nursing note reviewed.   Constitutional:      General: He is not in acute distress.    Appearance: He is not diaphoretic.  HENT:     Head: Normocephalic and atraumatic.  Eyes:     General: No scleral icterus.       Right eye: No discharge.        Left eye: No discharge.     Conjunctiva/sclera: Conjunctivae normal.  Cardiovascular:     Rate and Rhythm: Normal rate and regular rhythm.  Pulmonary:     Effort: Pulmonary effort is normal. No respiratory distress.     Breath sounds: No stridor. Examination of the right-middle field reveals rales. Examination of the left-middle field reveals rales. Examination of the right-lower field reveals rales. Examination of the left-lower field reveals rales. Rales present.  Chest:     Chest wall: No tenderness.  Abdominal:     General: There is no distension.     Palpations: Abdomen is soft.  Musculoskeletal:        General: No deformity.     Cervical back: Normal range of motion.     Right lower leg: Edema present.     Left lower leg: Edema present.     Comments: Right leg is larger than the left.  +3 pitting edema bilateral lower extremities up to the hips.  Skin:    General: Skin is warm and dry.  Neurological:     Mental Status: He is alert.     Motor: No abnormal muscle tone.     Comments: Patient is awake and alert.  Answers questions appropriately without difficulty.  Psychiatric:        Behavior: Behavior normal.    ED Results / Procedures / Treatments   Labs (all labs ordered are listed, but only abnormal results are displayed) Labs Reviewed  CBC WITH DIFFERENTIAL/PLATELET - Abnormal; Notable for the following components:      Result Value   RBC 4.12 (*)    Hemoglobin 10.4 (*)    HCT 33.1 (*)    MCH 25.2 (*)    RDW 16.5 (*)    Platelets 405 (*)    All other components within normal limits  BRAIN NATRIURETIC PEPTIDE - Abnormal; Notable for the following components:   B Natriuretic Peptide 959.9 (*)    All other components within normal limits   COMPREHENSIVE METABOLIC PANEL - Abnormal; Notable for the following components:   Glucose, Bld 128 (*)    BUN 76 (*)    Creatinine, Ser 3.35 (*)    Albumin 3.1 (*)    GFR, Estimated 23 (*)    All other components within normal limits  TROPONIN I (HIGH SENSITIVITY) -  Abnormal; Notable for the following components:   Troponin I (High Sensitivity) 24 (*)    All other components within normal limits  TROPONIN I (HIGH SENSITIVITY) - Abnormal; Notable for the following components:   Troponin I (High Sensitivity) 23 (*)    All other components within normal limits  RESP PANEL BY RT-PCR (FLU A&B, COVID) [id number]    EKG EKG Interpretation  Date/Time:  Wednesday May 08 2021 16:07:55 EST Ventricular Rate:  73 PR Interval:  152 QRS Duration: 89 QT Interval:  411 QTC Calculation: 453 R Axis:   80 Text Interpretation: Sinus rhythm Probable left atrial enlargement Abnormal T, consider ischemia, lateral leads When compared to prior, similar appearance. No STEMI Confirmed by Antony Blackbird [id number]) on 05/08/2021 5:22:31 PM  Radiology DG Chest 2 View  Result Date: 05/08/2021 CLINICAL DATA:  A 41 year old male presents for evaluation of suspected CHF. EXAM: CHEST - 2 VIEW COMPARISON:  February 19, 2021. FINDINGS: EKG leads project over the chest. Cardiac enlargement is redemonstrated. Fullness of pulmonary vasculature is also a similar finding compared to prior studies. Diffuse bilateral interstitial and nodular airspace disease, associated with a small RIGHT and likely trace LEFT pleural effusion. On limited assessment there is no acute skeletal process. IMPRESSION: Findings are again suspicious for multifocal pneumonia perhaps with a background of asymmetric pulmonary edema and bilateral effusions. Suggest follow-up to ensure resolution given the background nodular pattern that is present currently. This pattern is similar to radiographs from October of 2022. Electronically Signed   By: Zetta Bills  M.D.   On: 05/08/2021 15:31   VAS [place] LOWER EXTREMITY VENOUS (DVT) (7a-7p)  Result Date: 05/08/2021  Lower Venous DVT Study Patient Name:  Micheal Levy  Date of Exam:   05/08/2021 Medical Rec #: [id number]        Accession #:    [id number] Date of Birth: [birth year]        Patient Gender: M Patient Age:   4 years Exam Location:  [facility] Procedure:      VAS [place] LOWER EXTREMITY VENOUS (DVT) Referring Phys: Benjamine Mola Bijou Easler --------------------------------------------------------------------------------  Indications: Edema.  Limitations: Poor ultrasound/tissue interface and body habitus. Comparison Study: Previous exam on 02/19/2021 was negative for DVT Performing Technologist: Rogelia Rohrer RVT, RDMS  Examination Guidelines: A complete evaluation includes B-mode imaging, spectral Doppler, color Doppler, and power Doppler as needed of all accessible portions of each vessel. Bilateral testing is considered an integral part of a complete examination. Limited examinations for reoccurring indications may be performed as noted. The reflux portion of the exam is performed with the patient in reverse Trendelenburg.  +---------+---------------+---------+-----------+----------+--------------+  RIGHT     Compressibility Phasicity Spontaneity Properties Thrombus Aging  +---------+---------------+---------+-----------+----------+--------------+  CFV       Full            Yes       Yes                                    +---------+---------------+---------+-----------+----------+--------------+  SFJ       Full                                                             +---------+---------------+---------+-----------+----------+--------------+  FV Prox   Full            Yes       Yes                                    +---------+---------------+---------+-----------+----------+--------------+  FV Mid    Full            Yes       Yes                                     +---------+---------------+---------+-----------+----------+--------------+  FV Distal Full            Yes       Yes                                    +---------+---------------+---------+-----------+----------+--------------+  PFV       Full                                                             +---------+---------------+---------+-----------+----------+--------------+  POP       Full            Yes       Yes                                    +---------+---------------+---------+-----------+----------+--------------+  PTV       Full                                                             +---------+---------------+---------+-----------+----------+--------------+  PERO      Full                                                             +---------+---------------+---------+-----------+----------+--------------+   +----+---------------+---------+-----------+----------+--------------+  LEFT Compressibility Phasicity Spontaneity Properties Thrombus Aging  +----+---------------+---------+-----------+----------+--------------+  CFV  Full            Yes       Yes                                    +----+---------------+---------+-----------+----------+--------------+     Summary: RIGHT: - There is no evidence of deep vein thrombosis in the lower extremity. - There is no evidence of superficial venous thrombosis.  - No cystic structure found in the popliteal fossa. subcutaneous edema noted.  LEFT: - No evidence of common femoral vein obstruction.  *See table(s) above for measurements and observations.    Preliminary     Procedures Procedures    Medications  Ordered in ED Medications  hydrALAZINE (APRESOLINE) injection 10 mg (10 mg Intravenous Given 05/08/21 1736)    ED Course/ Medical Decision Making/ A&P Clinical Course as of 05/08/21 1956  Wed May 08, 2021  1738 Creatinine(!): 3.35 2 weeks ago was 2.2 raising concern for AKI. [EH]  1948 I spoke with hospitalist who will see patient for  admission.  [EH]    Clinical Course User Index [EH] Lorin Glass, PA-C                           Medical Decision Making Amount and/or Complexity of Data Reviewed Labs: ordered. Decision-making details documented in ED Course. ECG/medicine tests: ordered.  Risk Prescription drug management. Decision regarding hospitalization.    This patient presents to the ED for concern of leg edema, chest tightness, shortness of breath this involves an extensive number of treatment options, and is a complaint that carries with it a high risk of complications and morbidity.  The differential diagnosis includes ACS, CHF, PE, COVID, viral infection, bacterial infection, pneumothorax, dissection, tamponade, liver failure,  Co morbidities that complicate the patient evaluation  Elevated BMI,    Additional history obtained:  Additional history obtained from significant other at bedside External records from outside source obtained and reviewed including: No pertinent external records available for review  Lab Tests:  I Ordered, and personally interpreted labs.  The pertinent results include: Opponent slightly elevated at 24, 23, BNP is elevated at 959, creatinine is elevated at 3.35, 2 weeks ago was only 2.2 raising concern for AKI.  Flu and COVID testing is negative   Imaging Studies ordered:  I ordered imaging studies including chest x-ray, DVT study Chest x-ray shows concern for asymmetric pulmonary edema.  There was the concern of atypical infection raise, however patient denies fevers, recent sick contacts and does not have any symptoms that would be consistent with URI, I suspect that this is more edema given clinical picture. DVT study is negative.     Medicines ordered and prescription drug management:  I ordered medication including hydralazine for hypertension Reevaluation of the patient after these medicines showed that the patient improved    Test  Considered:  PE/CT of the chest, not felt to be indicated due to clinical picture consistent with fluid overload with appropriate history for fluid overload.   Critical Interventions:  Treatment of hypertension with hydralazine   Problem List / ED Course:  Hypertension, shortness of breath, leg swelling   Reevaluation:  After the interventions noted above, I reevaluated the patient and found that they have :stayed the same  Dispostion:  After consideration of the diagnostic results and the patients response to treatment, I feel that the patent would benefit from admission. He has AKI, needs ongoing diuresis and better control of his blood pressure.  Spoke with hospitalist who will see the patient for admission.  The patient appears reasonably stabilized for admission considering the current resources, flow, and capabilities available in the ED at this time, and I doubt any other [facility] requiring further screening and/or treatment in the ED prior to admission assuming timely admission and bed placement.  Note: Portions of this report may have been transcribed using voice recognition software. Every effort was made to ensure accuracy; however, inadvertent computerized transcription errors may be present  Final Clinical Impression(s) / ED Diagnoses Final diagnoses:  History of CVA (cerebrovascular accident)  AKI (acute kidney injury) ([place])  Uncontrolled hypertension  Acute on  chronic diastolic CHF (congestive heart failure) [facility])    Rx / DC Orders ED Discharge Orders     None         Ollen Gross 05/08/21 2208    Tegeler, Gwenyth Allegra, MD 05/08/21 2240

## 2021-05-08 NOTE — H&P (Signed)
. History and Physical    Micheal Levy RSW:[id number] DOB: [birth year] DOA: 05/08/2021  PCP: Barbette Merino, NP  Patient coming from: Home  I have personally briefly reviewed patient's old medical records in [facility]  Chief Complaint: Shortness of breath, lower extremity swelling  HPI: Micheal Levy is a 41 y.o. male with medical history significant for chronic diastolic CHF (EF 09-38%, G3 DD by TTE 01/2021), history of CVA, CKD stage IIIa, T2DM, HTN, HLD, history of substance use who presented to the ED for evaluation of shortness of breath and lower extremity swelling.  Patient reports 2 days of progressive exertional dyspnea, orthopnea, paroxysmal nocturnal dyspnea, and lower extremity swelling.  He has not had any chest pain.  He reports associated nonproductive cough.  He reports good urine output.  He has been taking oral Lasix daily.  He says he ran out of his clonidine patch about 1 and half weeks ago.  He has not been checking his weight regularly.  ED Course:  Initial vitals showed BP 180/80, pulse 66, RR 18, temp 98.1 F, SPO2 88% on room air.  Labs show WBC 7.0, hemoglobin 10.4, platelets 405,000, sodium 137, potassium 4.3, bicarb 22, BUN 76, creatinine 3.35 (recent baseline 2.0-2.2), serum glucose 128, LFTs within normal limits, troponin 24 > 23, BNP 959.9.  SARS-CoV-2 and influenza PCR negative.  2 view chest x-ray shows asymmetric pulmonary edema and bilateral effusions.  Patient was given IV hydralazine 10 mg.  RLE venous ultrasound negative for DVT.  The hospitalist service was consulted to admit for further evaluation and management.  Review of Systems: All systems reviewed and are negative except as documented in history of present illness above.   Past Medical History:  Diagnosis Date   Chronic kidney disease    Diabetes mellitus without complication (HCC)    Elevated serum cholesterol 04/2019   Elevated serum creatinine 04/2019   History of seizures     Hyperglycemia    Hypertension    Hypertensive urgency 04/2019   Hypokalemia    Seizures (HCC) 03/2017   x 2    Stroke (HCC) 03/2017   Substance abuse (HCC)    Vitamin D deficiency 04/2019    History reviewed. No pertinent surgical history.  Social History:  reports that he quit smoking about 22 years ago. His smoking use included cigarettes. He has never used smokeless tobacco. He reports current alcohol use. He reports current drug use. Drugs: Cocaine and Marijuana.  No Known Allergies  Family History  Problem Relation Age of Onset   Diabetes Father    Heart attack Father    Diabetes Sister    Hypertension Brother      Prior to Admission medications   Medication Sig Start Date End Date Taking? Authorizing Provider  albuterol (VENTOLIN HFA) 108 (90 Base) MCG/ACT inhaler Inhale 2 puffs into the lungs every 6 (six) hours as needed for wheezing or shortness of breath. 04/19/19   [name], FNP  amLODipine (NORVASC) 10 MG tablet Take 1 tablet (10 mg total) by mouth daily. 02/21/21 04/22/21  Zigmund Daniel., MD  aspirin EC 81 MG tablet Take 1 tablet (81 mg total) by mouth daily. 09/13/19   Rolly Salter, MD  atorvastatin (LIPITOR) 10 MG tablet Take 1 tablet (10 mg total) by mouth daily. 02/21/21 04/22/21  Zigmund Daniel., MD  Blood Glucose Monitoring Suppl (TRUE METRIX AIR GLUCOSE METER) DEVI 1 each by Does not apply route 4 (four)  times daily -  before meals and at bedtime. 04/19/19   [name], FNP  carvedilol (COREG) 25 MG tablet Take 1 tablet (25 mg total) by mouth 2 (two) times daily with a meal. 02/21/21 04/22/21  Zigmund Daniel., MD  furosemide (LASIX) 40 MG tablet Take 1 tablet (40 mg total) by mouth daily. 02/21/21 04/22/21  Zigmund Daniel., MD  glipiZIDE (GLUCOTROL XL) 10 MG 24 hr tablet Take 1 tablet (10 mg total) by mouth daily with breakfast. 01/19/21   Leatha Gilding, MD  glucose blood (TRUE METRIX BLOOD GLUCOSE TEST) test  strip Use as instructed Patient taking differently: 1 each by Other route as directed. 04/19/19   [name], FNP  hydrALAZINE (APRESOLINE) 100 MG tablet Take 1 tablet (100 mg total) by mouth 3 (three) times daily. 02/21/21 04/22/21  Zigmund Daniel., MD  insulin glargine (LANTUS SOLOSTAR) 100 UNIT/ML Solostar Pen Inject 5 Units into the skin daily at 10 pm. 01/19/21 01/19/22  Leatha Gilding, MD  Insulin Pen Needle (PEN NEEDLES) 31G X 6 MM MISC 1 each by Does not apply route at bedtime. Patient not taking: Reported on 02/26/2021 10/05/17   Massie Maroon, FNP  INSULIN SYRINGE .5CC/29G (B-D INS SYR ULTRAFINE .5CC/29G) 29G X 1/2" 0.5 ML MISC 1 each by Does not apply route at bedtime. Patient not taking: Reported on 01/12/2021 04/02/17   Massie Maroon, FNP  isosorbide mononitrate (IMDUR) 60 MG 24 hr tablet Take 1 tablet (60 mg total) by mouth daily. 02/21/21 04/22/21  Zigmund Daniel., MD  Lancets MISC 1 each by Does not apply route 4 (four) times daily -  before meals and at bedtime. Patient not taking: Reported on 01/12/2021 04/19/19   [name], FNP  Multiple Vitamins-Minerals (MENS ONE DAILY PO) Take 1 tablet by mouth daily.    [provider]  valsartan (DIOVAN) 40 MG tablet Take 1 tablet (40 mg total) by mouth daily. 02/26/21   [name], Kardie, DO  Vitamin D, Ergocalciferol, (DRISDOL) 1.25 MG (50000 UNIT) CAPS capsule Take 1 capsule (50,000 Units total) by mouth every 7 (seven) days. 04/25/19   [name], FNP    Physical Exam: Vitals:   05/08/21 1900 05/08/21 1915 05/08/21 2215 05/08/21 2300  BP: (!) 159/98 (!) 164/104 (!) 186/114 (!) 180/113  Pulse: 73 75 83 81  Resp: (!) 24 (!) 26 (!) 34 (!) 31  Temp:      TempSrc:      SpO2: 98% 97% 93% 94%   Constitutional: Resting in bed with head elevated, NAD, calm, comfortable Eyes: PERRL, lids and conjunctivae normal ENMT: Mucous membranes are moist. Posterior pharynx clear of any exudate or  lesions.Normal dentition.  Neck: normal, supple, no masses. Respiratory: clear to auscultation bilaterally, no wheezing, no crackles. Normal respiratory effort. No accessory muscle use.  Cardiovascular: Regular rate and rhythm, no murmurs / rubs / gallops.  +2 bilateral lower extremity edema. 2+ pedal pulses. Abdomen: no tenderness, no masses palpated. No hepatosplenomegaly. Bowel sounds positive.  Musculoskeletal: no clubbing / cyanosis. No joint deformity upper and lower extremities. Good ROM, no contractures. Normal muscle tone.  Skin: no rashes, lesions, ulcers. No induration Neurologic: CN 2-12 grossly intact. Sensation intact. Strength 5/5 in all 4.  Psychiatric: Normal judgment and insight. Alert and oriented x 3. Normal mood.  Labs on Admission: I have personally reviewed following labs and imaging studies  EKG: Personally reviewed. Normal sinus rhythm, LAE, rate  73.  Similar to prior.  Assessment/Plan Principal Problem:   Acute on chronic diastolic CHF (congestive heart failure) (HCC) Active Problems:   Acute kidney injury superimposed on chronic kidney disease (HCC)   Essential hypertension   History of CVA (cerebrovascular accident)   Type 2 diabetes mellitus with other specified complication (HCC)   Micheal Levy is a 41 y.o. male with medical history significant for chronic diastolic CHF (EF 0000000, G3 DD by TTE 01/2021), history of CVA, CKD stage IIIa, T2DM, HTN, HLD, history of substance use who is admitted with acute on chronic diastolic CHF exacerbation with associated AKI superimposed on CKD stage IIIa.  Assessment and Plan: * Acute on chronic diastolic CHF (congestive heart failure) ([place])- (present on admission) Presenting with orthopnea, DOE, PND, bilateral lower extremity swelling, asymmetric pulmonary edema on CXR, and elevated BNP consistent with acute on chronic diastolic CHF exacerbation. TTE 01/12/2021 showed EF 55-60% with G3 DD. -Start on IV Lasix 40 mg twice  daily -Monitor strict I/O's and daily weights  Acute kidney injury superimposed on chronic kidney disease ([place])- (present on admission) Creatinine 3.35 on admission compared to recent baseline 2.0-2.2.  Suspect secondary to volume overload in setting of acute on chronic HFpEF. -Monitor renal function with diuresis -Hold valsartan  Essential hypertension- (present on admission) Chronic uncontrolled hypertension.  Resume home meds. -Amlodipine 10 mg daily -Coreg 25 mg twice daily -Hydralazine 100 mg 3 times daily -Imdur 60 mg daily  History of CVA (cerebrovascular accident) Continue aspirin and statin.  Type 2 diabetes mellitus with other specified complication ([place])- (present on admission) Continue Lantus 5 mg nightly, add SSI.  Hold glipizide.  DVT prophylaxis: heparin injection 5,000 Units Start: 05/08/21 2200 Code Status: Full code, confirmed with patient on admission Family Communication: Significant other at bedside Disposition Plan: From home, dispo pending clinical progress Consults called: None Severity of Illness: The appropriate patient status for this patient is OBSERVATION. Observation status is judged to be reasonable and necessary in order to provide the required intensity of service to ensure the patient's safety. The patient's presenting symptoms, physical exam findings, and initial radiographic and laboratory data in the context of their medical condition is felt to place them at decreased risk for further clinical deterioration. Furthermore, it is anticipated that the patient will be medically stable for discharge from the hospital within 2 midnights of admission.   Zada Finders MD [facility]  If 7PM-7AM, please contact night-coverage [email or web address]  05/08/2021, 11:59 PM

## 2021-05-08 NOTE — Assessment & Plan Note (Addendum)
Continue Lantus 5 mg nightly, add SSI.  Hold glipizide.  Blood sugar stable.

## 2021-05-08 NOTE — Progress Notes (Signed)
RLE venous duplex has been completed.  Preliminary results given to Lyndel Safe, PA-C.   Results can be found under chart review under CV PROC. 05/08/2021 6:46 PM Tymarion Everard RVT, RDMS

## 2021-05-08 NOTE — ED Triage Notes (Signed)
Pt arrived via POV, c/o SOB and legg swelling. Worsening with mvmt and while sleeping at night. Hypoxic 88% RA maintained for some time. Up to 90% after several deep breaths.

## 2021-05-08 NOTE — Assessment & Plan Note (Signed)
Continue aspirin and statin. 

## 2021-05-08 NOTE — Hospital Course (Addendum)
Micheal Levy is a 41 y.o. male with medical history significant for chronic diastolic CHF (EF 0000000, G3 DD by TTE 01/2021), history of CVA, CKD stage IIIa, T2DM, HTN, HLD, history of substance use who is admitted with acute on chronic diastolic CHF exacerbation with associated AKI superimposed on CKD stage IIIa.

## 2021-05-08 NOTE — ED Provider Triage Note (Addendum)
Emergency Medicine Provider Triage Evaluation Note  Micheal Levy , Levy 41 y.o. male  was evaluated in triage.  Pt complains of SOB and increase le edema. On diuretic per patient. Has not missed any doses. Feels tight in chest. Sleeps with 2 pillows. Denies PND, orthopnea. Some dry cough. No hx of PE, DVT. No fever, sick contacts. Patient unsure of hx of heart failure.  Review of Systems  Positive: Cough, sob, le edema Negative: Fever, emesis  Physical Exam  There were no vitals taken for this visit. Gen:   Awake, no distress   Resp:  Normal effort, mild crackles at bases bl MSK:   Moves extremities without difficulty, 2 + pitting edema bl Other:    Medical Decision Making  Medically screening exam initiated at 3:12 PM.  Appropriate orders placed.  Micheal Levy was informed that the remainder of the evaluation will be completed by another provider, this initial triage assessment does not replace that evaluation, and the importance of remaining in the ED until their evaluation is complete.  Sob, increase in LE edema  Hypoxic from 88-91% on RA. Recent admission for HTN urgency  Nursing aware patient needs room in back    Micheal Bashore A, PA-C 05/08/21 1517

## 2021-05-09 ENCOUNTER — Observation Stay (HOSPITAL_COMMUNITY): Payer: Medicaid Other

## 2021-05-09 DIAGNOSIS — N1831 Chronic kidney disease, stage 3a: Secondary | ICD-10-CM | POA: Diagnosis present

## 2021-05-09 DIAGNOSIS — E1169 Type 2 diabetes mellitus with other specified complication: Secondary | ICD-10-CM | POA: Diagnosis present

## 2021-05-09 DIAGNOSIS — I5033 Acute on chronic diastolic (congestive) heart failure: Secondary | ICD-10-CM | POA: Diagnosis present

## 2021-05-09 DIAGNOSIS — Z8673 Personal history of transient ischemic attack (TIA), and cerebral infarction without residual deficits: Secondary | ICD-10-CM | POA: Diagnosis not present

## 2021-05-09 DIAGNOSIS — E559 Vitamin D deficiency, unspecified: Secondary | ICD-10-CM | POA: Diagnosis present

## 2021-05-09 DIAGNOSIS — Z7984 Long term (current) use of oral hypoglycemic drugs: Secondary | ICD-10-CM | POA: Diagnosis not present

## 2021-05-09 DIAGNOSIS — I130 Hypertensive heart and chronic kidney disease with heart failure and stage 1 through stage 4 chronic kidney disease, or unspecified chronic kidney disease: Secondary | ICD-10-CM | POA: Diagnosis present

## 2021-05-09 DIAGNOSIS — Z8249 Family history of ischemic heart disease and other diseases of the circulatory system: Secondary | ICD-10-CM | POA: Diagnosis not present

## 2021-05-09 DIAGNOSIS — Z833 Family history of diabetes mellitus: Secondary | ICD-10-CM | POA: Diagnosis not present

## 2021-05-09 DIAGNOSIS — Z7982 Long term (current) use of aspirin: Secondary | ICD-10-CM | POA: Diagnosis not present

## 2021-05-09 DIAGNOSIS — N179 Acute kidney failure, unspecified: Secondary | ICD-10-CM | POA: Diagnosis present

## 2021-05-09 DIAGNOSIS — E1122 Type 2 diabetes mellitus with diabetic chronic kidney disease: Secondary | ICD-10-CM | POA: Diagnosis present

## 2021-05-09 DIAGNOSIS — E785 Hyperlipidemia, unspecified: Secondary | ICD-10-CM | POA: Diagnosis present

## 2021-05-09 DIAGNOSIS — Z79899 Other long term (current) drug therapy: Secondary | ICD-10-CM | POA: Diagnosis not present

## 2021-05-09 DIAGNOSIS — I509 Heart failure, unspecified: Secondary | ICD-10-CM | POA: Diagnosis present

## 2021-05-09 DIAGNOSIS — Z87891 Personal history of nicotine dependence: Secondary | ICD-10-CM | POA: Diagnosis not present

## 2021-05-09 DIAGNOSIS — Z20822 Contact with and (suspected) exposure to covid-19: Secondary | ICD-10-CM | POA: Diagnosis present

## 2021-05-09 LAB — CBG MONITORING, ED
Glucose-Capillary: 113 mg/dL — ABNORMAL HIGH (ref 70–99)
Glucose-Capillary: 147 mg/dL — ABNORMAL HIGH (ref 70–99)
Glucose-Capillary: 132 mg/dL — ABNORMAL HIGH (ref 70–99)
Glucose-Capillary: 113 mg/dL — ABNORMAL HIGH (ref 70–99)

## 2021-05-09 LAB — BASIC METABOLIC PANEL
Anion gap: 8 (ref 5–15)
BUN: 72 mg/dL — ABNORMAL HIGH (ref 6–20)
CO2: 21 mmol/L — ABNORMAL LOW (ref 22–32)
Calcium: 8.6 mg/dL — ABNORMAL LOW (ref 8.9–10.3)
Chloride: 107 mmol/L (ref 98–111)
Creatinine, Ser: 3.23 mg/dL — ABNORMAL HIGH (ref 0.61–1.24)
GFR, Estimated: 24 mL/min — ABNORMAL LOW (ref >60)
Glucose, Bld: 143 mg/dL — ABNORMAL HIGH (ref 70–99)
Potassium: 4 mmol/L (ref 3.5–5.1)
Sodium: 136 mmol/L (ref 135–145)

## 2021-05-09 LAB — CBC
HCT: 30.7 % — ABNORMAL LOW (ref 39.0–52.0)
Hemoglobin: 9.7 g/dL — ABNORMAL LOW (ref 13.0–17.0)
MCH: 25.1 pg — ABNORMAL LOW (ref 26.0–34.0)
MCHC: 31.6 g/dL (ref 30.0–36.0)
MCV: 79.5 fL — ABNORMAL LOW (ref 80.0–100.0)
Platelets: 392 10*3/uL (ref 150–400)
RBC: 3.86 MIL/uL — ABNORMAL LOW (ref 4.22–5.81)
RDW: 16.7 % — ABNORMAL HIGH (ref 11.5–15.5)
WBC: 7.4 10*3/uL (ref 4.0–10.5)
nRBC: 0 % (ref 0.0–0.2)

## 2021-05-09 LAB — MAGNESIUM: Magnesium: 2.1 mg/dL (ref 1.7–2.4)

## 2021-05-09 NOTE — ED Notes (Signed)
Pt received dinner tray.

## 2021-05-09 NOTE — Progress Notes (Signed)
°  Progress Note   Patient: Micheal Levy [id number] DOB: 10-29-1980 DOA: 05/08/2021     0 DOS: the patient was seen and examined on 05/09/2021   Brief hospital course: Micheal Levy is a 41 y.o. male with medical history significant for chronic diastolic CHF (EF 0000000, G3 DD by TTE 01/2021), history of CVA, CKD stage IIIa, T2DM, HTN, HLD, history of substance use who is admitted with acute on chronic diastolic CHF exacerbation with associated AKI superimposed on CKD stage IIIa.  Assessment and Plan: Acute on chronic diastolic CHF (congestive heart failure) ([place])- (present on admission) Presenting with orthopnea, DOE, PND, bilateral lower extremity swelling, asymmetric pulmonary edema on CXR, and elevated BNP consistent with acute on chronic diastolic CHF exacerbation. TTE 01/12/2021 showed EF 55-60% with G3 DD. -Currently remains on IV Lasix 40 mg twice daily, urine output is not accurately measured.  Continue intake output monitoring.  Daily weight.  Currently no indication for repeat echocardiogram.  Type 2 diabetes mellitus with other specified complication ([place])- (present on admission) Continue Lantus 5 mg nightly, add SSI.  Hold glipizide.  History of CVA (cerebrovascular accident) Continue aspirin and statin.  Stable.  Essential hypertension- (present on admission) Chronic uncontrolled hypertension.  Resume home meds. -Amlodipine 10 mg daily -Coreg 25 mg twice daily -Hydralazine 100 mg 3 times daily -Imdur 60 mg daily  Acute kidney injury superimposed on chronic kidney disease ([place])- (present on admission) Creatinine 3.35 on admission compared to recent baseline 2.0-2.2.  Suspect secondary to volume overload in setting of acute on chronic HFpEF. -Remains stable with slight improvement today.  Continue to monitor daily on diuresis. -Hold valsartan -Due to recent worsening renal functions, will check kidney ultrasound.       Subjective: Patient seen and examined.  Still in  the emergency room.  Denies any complaints including chest pain or shortness of breath at rest.  Physical Exam: Vitals:   05/09/21 0800 05/09/21 1000 05/09/21 1100 05/09/21 1130  BP: (!) 176/102 (!) 149/91 (!) 122/92 (!) 154/95  Pulse: 75 69 67 71  Resp: (!) 28 (!) 25 (!) 21 20  Temp:      TempSrc:      SpO2: 93% 94% 94% 96%   General: Comfortably sitting in hospital bed.  Without any distress.  On room air. Cardiovascular: S1-S2 normal.  No added sounds.  Regular rate rhythm. Respiratory: Bilateral clear.  No added sounds. Gastrointestinal: Soft.  Nontender.  Bowel sound present. Ext: 2+ bilateral pedal edema.  No cyanosis or edema. Neuro: Alert oriented x4.  No focal deficits.   Data Reviewed:  All the results are reviewed from today.  Also discussed with patient about his kidney functions.  Family Communication: None at the bedside.  Disposition: Status is: Inpatient Remains inpatient appropriate because: IV diuresis.  Significant anasarca.          Planned Discharge Destination: Home     Time spent: 35 minutes  Author: Barb Merino, MD 05/09/2021 1:47 PM  For on call review [email or web address].

## 2021-05-10 LAB — CBC WITH DIFFERENTIAL/PLATELET
Abs Immature Granulocytes: 0.03 10*3/uL (ref 0.00–0.07)
Basophils Absolute: 0.1 10*3/uL (ref 0.0–0.1)
Basophils Relative: 1 %
Eosinophils Absolute: 0.2 10*3/uL (ref 0.0–0.5)
Eosinophils Relative: 2 %
HCT: 31.2 % — ABNORMAL LOW (ref 39.0–52.0)
Hemoglobin: 9.8 g/dL — ABNORMAL LOW (ref 13.0–17.0)
Immature Granulocytes: 0 %
Lymphocytes Relative: 28 %
Lymphs Abs: 2 10*3/uL (ref 0.7–4.0)
MCH: 25.3 pg — ABNORMAL LOW (ref 26.0–34.0)
MCHC: 31.4 g/dL (ref 30.0–36.0)
MCV: 80.4 fL (ref 80.0–100.0)
Monocytes Absolute: 0.7 10*3/uL (ref 0.1–1.0)
Monocytes Relative: 10 %
Neutro Abs: 4.1 10*3/uL (ref 1.7–7.7)
Neutrophils Relative %: 59 %
Platelets: 386 10*3/uL (ref 150–400)
RBC: 3.88 MIL/uL — ABNORMAL LOW (ref 4.22–5.81)
RDW: 16.9 % — ABNORMAL HIGH (ref 11.5–15.5)
WBC: 7.1 10*3/uL (ref 4.0–10.5)
nRBC: 0 % (ref 0.0–0.2)

## 2021-05-10 LAB — GLUCOSE, CAPILLARY
Glucose-Capillary: 108 mg/dL — ABNORMAL HIGH (ref 70–99)
Glucose-Capillary: 137 mg/dL — ABNORMAL HIGH (ref 70–99)
Glucose-Capillary: 110 mg/dL — ABNORMAL HIGH (ref 70–99)

## 2021-05-10 LAB — BASIC METABOLIC PANEL
Anion gap: 8 (ref 5–15)
BUN: 76 mg/dL — ABNORMAL HIGH (ref 6–20)
CO2: 21 mmol/L — ABNORMAL LOW (ref 22–32)
Calcium: 8.6 mg/dL — ABNORMAL LOW (ref 8.9–10.3)
Chloride: 108 mmol/L (ref 98–111)
Creatinine, Ser: 3.14 mg/dL — ABNORMAL HIGH (ref 0.61–1.24)
GFR, Estimated: 24 mL/min — ABNORMAL LOW (ref >60)
Glucose, Bld: 111 mg/dL — ABNORMAL HIGH (ref 70–99)
Potassium: 3.9 mmol/L (ref 3.5–5.1)
Sodium: 137 mmol/L (ref 135–145)

## 2021-05-10 LAB — CBG MONITORING, ED: Glucose-Capillary: 107 mg/dL — ABNORMAL HIGH (ref 70–99)

## 2021-05-10 LAB — PHOSPHORUS: Phosphorus: 4.9 mg/dL — ABNORMAL HIGH (ref 2.5–4.6)

## 2021-05-10 LAB — MAGNESIUM: Magnesium: 2.2 mg/dL (ref 1.7–2.4)

## 2021-05-10 NOTE — ED Notes (Signed)
Breakfast tray offered. Pt did not want it 

## 2021-05-10 NOTE — Progress Notes (Signed)
°  Progress Note   Patient: Micheal Levy [id number] DOB: 02-20-1981 DOA: 05/08/2021     1 DOS: the patient was seen and examined on 05/10/2021   Brief hospital course: Micheal Levy is a 41 y.o. male with medical history significant for chronic diastolic CHF (EF 0000000, G3 DD by TTE 01/2021), history of CVA, CKD stage IIIa, T2DM, HTN, HLD, history of substance use who is admitted with acute on chronic diastolic CHF exacerbation with associated AKI superimposed on CKD stage IIIa.  Assessment and Plan: * Acute on chronic diastolic CHF (congestive heart failure) ([place])- (present on admission) Presenting with orthopnea, DOE, PND, bilateral lower extremity swelling, asymmetric pulmonary edema on CXR, and elevated BNP consistent with acute on chronic diastolic CHF exacerbation. TTE 01/12/2021 showed EF 55-60% with G3 DD. -Continue on IV Lasix 40 mg twice daily -Monitor strict I/O's and daily weights -Unfortunately, he is in the emergency room for the last 2 days, no output is recorded.  Type 2 diabetes mellitus with other specified complication ([place])- (present on admission) Continue Lantus 5 mg nightly, add SSI.  Hold glipizide.  Blood sugar stable.  History of CVA (cerebrovascular accident) Continue aspirin and statin.  Essential hypertension- (present on admission) Chronic uncontrolled hypertension.  Better today. -Amlodipine 10 mg daily -Coreg 25 mg twice daily -Hydralazine 100 mg 3 times daily -Imdur 60 mg daily  Acute kidney injury superimposed on chronic kidney disease ([place])- (present on admission) Creatinine 3.35 on admission compared to recent baseline 2.0-2.2.  Suspect secondary to volume overload in setting of acute on chronic HFpEF. -Monitor renal function with diuresis -Hold valsartan -Slight improvement of renal functions. -Renal ultrasound with no hydronephrosis or obstruction.  Shows advanced medical renal disease.        Subjective: Patient seen and examined.  He  was still in the emergency room.  Tired of laying in the stretcher for the last 2 days.  He was more sleepy today.  He said he did not get good rest. Urine output is not recorded.  Weight is not recorded. He has not mobilized around so not sure his shortness of breath has improved.  Physical Exam: Vitals:   05/10/21 0900 05/10/21 0930 05/10/21 1100 05/10/21 1116  BP: (!) 181/112 (!) 175/105 (!) 170/107 (!) 174/109  Pulse: 78 70 73 76  Resp: (!) 24 (!) 26 20 18   Temp:   98.5 F (36.9 C) 97.8 F (36.6 C)  TempSrc:    Oral  SpO2: 93% 92% 92% 94%   General: Sleepy.  Not in any distress.  On room air. Cardiovascular: S1-S2 normal.  Regular rate rhythm. Respiratory: Bilateral clear.  No added sounds. Gastrointestinal: Soft.  Nontender.  Bowel sound present. Ext: Has 2+ lower extremity edema, mostly chronic. Neuro: Sleepy.  Alert oriented x4.  No focal logical deficits.   Data Reviewed:  Kidney functions reviewed, discussed with patient.  Updated.  Family Communication: None  Disposition: Status is: Inpatient Remains inpatient appropriate because: IV diuresis.          Planned Discharge Destination: Home     Time spent: 35 minutes  Author: Barb Merino, MD 05/10/2021 11:21 AM  For on call review [email or web address].

## 2021-05-10 NOTE — ED Notes (Signed)
Pt resting in stretcher, offers no complaints. NAD.  

## 2021-05-11 LAB — BASIC METABOLIC PANEL
Anion gap: 9 (ref 5–15)
BUN: 72 mg/dL — ABNORMAL HIGH (ref 6–20)
CO2: 22 mmol/L (ref 22–32)
Calcium: 8.5 mg/dL — ABNORMAL LOW (ref 8.9–10.3)
Chloride: 106 mmol/L (ref 98–111)
Creatinine, Ser: 3.03 mg/dL — ABNORMAL HIGH (ref 0.61–1.24)
GFR, Estimated: 25 mL/min — ABNORMAL LOW (ref >60)
Glucose, Bld: 100 mg/dL — ABNORMAL HIGH (ref 70–99)
Potassium: 3.8 mmol/L (ref 3.5–5.1)
Sodium: 137 mmol/L (ref 135–145)

## 2021-05-11 LAB — GLUCOSE, CAPILLARY: Glucose-Capillary: 93 mg/dL (ref 70–99)

## 2021-05-11 MED ORDER — FUROSEMIDE 40 MG PO TABS
40.0000 mg | ORAL_TABLET | Freq: Two times a day (BID) | ORAL | 0 refills | Status: DC
  Filled 2021-05-11: qty 120, 60d supply, fill #0

## 2021-05-11 NOTE — Discharge Summary (Signed)
Physician Discharge Summary   Patient: Micheal Levy MRN: [id number] DOB: 1980-12-15  Admit date:     05/08/2021  Discharge date: 05/11/21  Discharge Physician: Barb Merino   PCP: [name], NP   Recommendations at discharge:   Continue taking prescribed medications.  Increasing dose of Lasix.  We will send referral to nephrology for long-term follow-up.  Discharge Diagnoses: Principal Problem:   Acute on chronic diastolic CHF (congestive heart failure) (HCC) Active Problems:   Acute kidney injury superimposed on chronic kidney disease (HCC)   Essential hypertension   History of CVA (cerebrovascular accident)   CHF exacerbation (HCC)   Type 2 diabetes mellitus with other specified complication ([place])  Resolved Problems:   * No resolved hospital problems. *   Hospital Course: Micheal Levy is a 41 y.o. male with medical history significant for chronic diastolic CHF (EF 0000000, G3 DD by TTE 01/2021), history of CVA, CKD stage IIIa, T2DM, HTN, HLD, history of substance use who is admitted with acute on chronic diastolic CHF exacerbation with associated AKI superimposed on CKD stage IIIa.  Assessment and Plan: * Acute on chronic diastolic CHF (congestive heart failure) ([place])- (present on admission) Presenting with orthopnea, DOE, PND, bilateral lower extremity swelling, asymmetric pulmonary edema on CXR, and elevated BNP consistent with acute on chronic diastolic CHF exacerbation. TTE 01/12/2021 showed EF 55-60% with G3 DD. -Treated with IV Lasix 40 mg twice a day.  Respiratory symptoms improved.  He is able to lay flat.  Able to walk around without shortness of breath.  Still has 1+ pedal edema but mostly chronic.  Wants to go home today. -We will discharge him with Lasix 40 mg twice daily, heart failure therapies including beta-blockers.  We will hold ARB given fluctuating renal functions.  We will send referral to nephrology for follow-up. -Lifestyle modifications.  Dietary  modification including salt and water intake.  Type 2 diabetes mellitus with other specified complication ([place])- (present on admission) Continue Lantus 5 mg nightly.  Resume glipizide.  Blood sugar stable.  History of CVA (cerebrovascular accident) Continue aspirin and statin.  Essential hypertension- (present on admission) Chronic uncontrolled hypertension.  Better today. -Amlodipine 10 mg daily -Coreg 25 mg twice daily -Hydralazine 100 mg 3 times daily -Imdur 60 mg daily Resume all on discharge.  Acute kidney injury superimposed on chronic kidney disease ([place])- (present on admission) Creatinine 3.35 on admission compared to recent baseline 2.0-2.2.  Suspect secondary to volume overload in setting of acute on chronic HFpEF. -Gradually normalizing.  Creatinine 3.3 today. -Hold valsartan -Slight improvement of renal functions. -Renal ultrasound with no hydronephrosis or obstruction.  Shows advanced medical renal disease.  Sent to nephrology for follow-up.  Patient medically stabilized.  Able to be discharged home today with outpatient follow-up.          Consultants: None Procedures performed: None Disposition: Home Diet recommendation:  Discharge Diet Orders (From admission, onward)     Start     Ordered   05/11/21 0000  Diet - low sodium heart healthy        05/11/21 0955   05/11/21 0000  Diet Carb Modified        05/11/21 0955           Cardiac and Carb modified diet  DISCHARGE MEDICATION: Allergies as of 05/11/2021   No Known Allergies      Medication List     STOP taking these medications    valsartan 40 MG tablet Commonly known  as: Diovan   Vitamin D (Ergocalciferol) 1.25 MG (50000 UNIT) Caps capsule Commonly known as: DRISDOL       TAKE these medications    albuterol 108 (90 Base) MCG/ACT inhaler Commonly known as: VENTOLIN HFA Inhale 2 puffs into the lungs every 6 (six) hours as needed for wheezing or shortness of breath.   amLODipine  10 MG tablet Commonly known as: NORVASC Take 1 tablet (10 mg total) by mouth daily.   aspirin EC 81 MG tablet Take 1 tablet (81 mg total) by mouth daily.   atorvastatin 10 MG tablet Commonly known as: LIPITOR Take 1 tablet (10 mg total) by mouth daily.   carvedilol 25 MG tablet Commonly known as: COREG Take 1 tablet (25 mg total) by mouth 2 (two) times daily with a meal.   furosemide 40 MG tablet Commonly known as: Lasix Take 1 tablet (40 mg total) by mouth 2 (two) times daily. What changed: when to take this   glipiZIDE 10 MG 24 hr tablet Commonly known as: Glucotrol XL Take 1 tablet (10 mg total) by mouth daily with breakfast.   hydrALAZINE 100 MG tablet Commonly known as: APRESOLINE Take 1 tablet (100 mg total) by mouth 3 (three) times daily.   INSULIN SYRINGE .5CC/29G 29G X 1/2" 0.5 ML Misc Commonly known as: B-D INS SYR ULTRAFINE .5CC/29G 1 each by Does not apply route at bedtime.   isosorbide mononitrate 60 MG 24 hr tablet Commonly known as: IMDUR Take 1 tablet (60 mg total) by mouth daily.   Lancets Misc 1 each by Does not apply route 4 (four) times daily -  before meals and at bedtime.   Lantus SoloStar 100 UNIT/ML Solostar Pen Generic drug: insulin glargine Inject 5 Units into the skin daily at 10 pm. What changed: when to take this   MENS ONE DAILY PO Take 1 tablet by mouth daily.   Pen Needles 31G X 6 MM Misc 1 each by Does not apply route at bedtime.   True Metrix Air Glucose Meter Devi 1 each by Does not apply route 4 (four) times daily -  before meals and at bedtime.   True Metrix Blood Glucose Test test strip Generic drug: glucose blood Use as instructed What changed:  how much to take how to take this when to take this additional instructions        Follow-up Information     [name], NP Follow up in 2 week(s).   Specialty: Adult Health Nurse Practitioner Contact information: [address] [place]  [address] [phone]         Berniece Salines, DO .   Specialty: Cardiology Contact information: [address] [place] [address] [phone]                 Discharge Exam: There were no vitals filed for this visit. General: Looks comfortable.  Walking in the room.  On room air. Cardiovascular: S1-S2 normal.  Regular rate rhythm. Respiratory: Bilateral clear.  No added sounds.  No crackles or wheezing. Gastrointestinal: Soft.  Nontender.  Bowel sound present. Ext: No cyanosis.  1+ pedal edema bilateral. Neuro: Alert oriented x4.  No focal deficits.   Condition at discharge: good  The results of significant diagnostics from this hospitalization (including imaging, microbiology, ancillary and laboratory) are listed below for reference.   Imaging Studies: DG Chest 2 View  Result Date: 05/08/2021 CLINICAL DATA:  A 41 year old male presents for evaluation of suspected CHF.  EXAM: CHEST - 2 VIEW COMPARISON:  February 19, 2021. FINDINGS: EKG leads project over the chest. Cardiac enlargement is redemonstrated. Fullness of pulmonary vasculature is also a similar finding compared to prior studies. Diffuse bilateral interstitial and nodular airspace disease, associated with a small RIGHT and likely trace LEFT pleural effusion. On limited assessment there is no acute skeletal process. IMPRESSION: Findings are again suspicious for multifocal pneumonia perhaps with a background of asymmetric pulmonary edema and bilateral effusions. Suggest follow-up to ensure resolution given the background nodular pattern that is present currently. This pattern is similar to radiographs from October of 2022. Electronically Signed   By: Zetta Bills M.D.   On: 05/08/2021 15:31   CT Soft Tissue Neck W Contrast  Result Date: 04/22/2021 CLINICAL DATA:  Epiglottitis or tonsillitis suspected. Sore throat for 2 days with cough and muffled voice. Group A strep positive. EXAM: CT NECK WITH CONTRAST  TECHNIQUE: Multidetector CT imaging of the neck was performed using the standard protocol following the bolus administration of intravenous contrast. RADIATION DOSE REDUCTION: This exam was performed according to the departmental dose-optimization program which includes automated exposure control, adjustment of the mA and/or kV according to patient size and/or use of iterative reconstruction technique. CONTRAST:  44mL OMNIPAQUE IOHEXOL 300 MG/ML  SOLN COMPARISON:  CTA head and neck 09/04/2019 FINDINGS: Pharynx and larynx: There is prominent asymmetric enlargement of the right-sided palatine tonsillar soft tissues with surrounding low-density submucosal edema extending more inferiorly in the oropharynx and with milder tonsillar enlargement on the left. A small retropharyngeal effusion measures up to 6 mm in thickness. Tonsillar enlargement moderately narrows the upper oropharyngeal airway. No peritonsillar abscess is evident. There is asymmetric inflammation involving the right parapharyngeal space and posterior right submandibular space. There is no significant epiglottic edema. Salivary glands: No inflammation, mass, or stone. Thyroid: Unremarkable. Lymph nodes: Enlarged level II lymph nodes measure up to 1.5 cm in short axis on the right and 1.3 cm on the left. Mildly enlarged subcentimeter short axis lymph nodes are noted in levels III-V. Vascular: Major vascular structures of the neck are grossly patent. Limited intracranial: Partially visualized small quadrigeminal cistern lipoma. Small right middle cranial fossa arachnoid cyst. Visualized orbits: Unremarkable. Mastoids and visualized paranasal sinuses: Mild mucosal thickening in the right maxillary sinus. Clear mastoid air cells. Skeleton: No acute osseous abnormality or suspicious osseous lesion. Upper chest: No apical lung consolidation or mass. Other: None. IMPRESSION: 1. Right larger than left tonsillar enlargement and regional inflammation consistent with  acute tonsillitis/pharyngitis acute tonsillitis including a small retropharyngeal effusion. Definite no abscess. 2. Bilateral cervical lymphadenopathy, likely reactive. Electronically Signed   By: Logan Bores M.D.   On: 04/22/2021 14:03   US RENAL  Result Date: 05/09/2021 CLINICAL DATA:  Acute renal failure with elevated creatinine EXAM: RENAL / URINARY TRACT ULTRASOUND COMPLETE COMPARISON:  03/22/2017 FINDINGS: Right Kidney: Renal measurements: 11.6 x 5.2 x 5.5 cm = volume: 175.5 mL. Diffusely increased parenchymal echogenicity noted. No mass or hydronephrosis visualized. Left Kidney: Renal measurements: 11.2 x 4.2 x 5.5 cm = volume: 135.1 mL. Diffusely increased parenchymal echogenicity. No mass or hydronephrosis visualized. Bladder: Appears normal for degree of bladder distention. Other: None. IMPRESSION: 1. No hydronephrosis. 2. Bilateral echogenic kidneys compatible with chronic medical renal disease. Electronically Signed   By: Kerby Moors M.D.   On: 05/09/2021 11:28   VAS [place] LOWER EXTREMITY VENOUS (DVT) (7a-7p)  Result Date: 05/09/2021  Lower Venous DVT Study Patient Name:  SANDRA LAFEVERS  Date  of Exam:   05/08/2021 Medical Rec #: [id number]        Accession #:    [id number] Date of Birth: 19-Dec-1980        Patient Gender: M Patient Age:   72 years Exam Location:  [facility] Procedure:      VAS [place] LOWER EXTREMITY VENOUS (DVT) Referring Phys: Benjamine Mola HAMMOND --------------------------------------------------------------------------------  Indications: Edema.  Limitations: Poor ultrasound/tissue interface and body habitus. Comparison Study: Previous exam on 02/19/2021 was negative for DVT Performing Technologist: Rogelia Rohrer RVT, RDMS  Examination Guidelines: A complete evaluation includes B-mode imaging, spectral Doppler, color Doppler, and power Doppler as needed of all accessible portions of each vessel. Bilateral testing is considered an integral part of a complete examination. Limited  examinations for reoccurring indications may be performed as noted. The reflux portion of the exam is performed with the patient in reverse Trendelenburg.  +---------+---------------+---------+-----------+----------+--------------+  RIGHT     Compressibility Phasicity Spontaneity Properties Thrombus Aging  +---------+---------------+---------+-----------+----------+--------------+  CFV       Full            Yes       Yes                                    +---------+---------------+---------+-----------+----------+--------------+  SFJ       Full                                                             +---------+---------------+---------+-----------+----------+--------------+  FV Prox   Full            Yes       Yes                                    +---------+---------------+---------+-----------+----------+--------------+  FV Mid    Full            Yes       Yes                                    +---------+---------------+---------+-----------+----------+--------------+  FV Distal Full            Yes       Yes                                    +---------+---------------+---------+-----------+----------+--------------+  PFV       Full                                                             +---------+---------------+---------+-----------+----------+--------------+  POP       Full            Yes       Yes                                    +---------+---------------+---------+-----------+----------+--------------+  PTV       Full                                                             +---------+---------------+---------+-----------+----------+--------------+  PERO      Full                                                             +---------+---------------+---------+-----------+----------+--------------+   +----+---------------+---------+-----------+----------+--------------+  LEFT Compressibility Phasicity Spontaneity Properties Thrombus Aging   +----+---------------+---------+-----------+----------+--------------+  CFV  Full            Yes       Yes                                    +----+---------------+---------+-----------+----------+--------------+     Summary: RIGHT: - There is no evidence of deep vein thrombosis in the lower extremity. - There is no evidence of superficial venous thrombosis.  - No cystic structure found in the popliteal fossa. subcutaneous edema noted.  LEFT: - No evidence of common femoral vein obstruction.  *See table(s) above for measurements and observations. Electronically signed by Orlie Pollen on 05/09/2021 at 5:49:30 PM.    Final     Microbiology: Results for orders placed or performed during the hospital encounter of 05/08/21  Resp Panel by RT-PCR (Flu A&B, Covid) Nasopharyngeal Swab     Status: None   Collection Time: 05/08/21  4:03 PM   Specimen: Nasopharyngeal Swab; Nasopharyngeal(NP) swabs in vial transport medium  Result Value Ref Range Status   SARS Coronavirus 2 by RT PCR NEGATIVE NEGATIVE Final    Comment: (NOTE) SARS-CoV-2 target nucleic acids are NOT DETECTED.  The SARS-CoV-2 RNA is generally detectable in upper respiratory specimens during the acute phase of infection. The lowest concentration of SARS-CoV-2 viral copies this assay can detect is 138 copies/mL. A negative result does not preclude SARS-Cov-2 infection and should not be used as the sole basis for treatment or other patient management decisions. A negative result may occur with  improper specimen collection/handling, submission of specimen other than nasopharyngeal swab, presence of viral mutation(s) within the areas targeted by this assay, and inadequate number of viral copies(<138 copies/mL). A negative result must be combined with clinical observations, patient history, and epidemiological information. The expected result is Negative.  Fact Sheet for Patients:  EntrepreneurPulse.com.au  Fact Sheet for  Healthcare Providers:  IncredibleEmployment.be  This test is no t yet approved or cleared by the [place] FDA and  has been authorized for detection and/or diagnosis of SARS-CoV-2 by FDA under an Emergency Use Authorization (EUA). This EUA will remain  in effect (meaning this test can be used) for the duration of the COVID-19 declaration under Section 564(b)(1) of the Act, 21 U.S.C.section 360bbb-3(b)(1), unless the authorization is terminated  or revoked sooner.       Influenza A by PCR NEGATIVE NEGATIVE Final   Influenza B by PCR NEGATIVE NEGATIVE Final    Comment: (NOTE) The Xpert Xpress SARS-CoV-2/FLU/RSV plus assay is intended as an aid  in the diagnosis of influenza from Nasopharyngeal swab specimens and should not be used as a sole basis for treatment. Nasal washings and aspirates are unacceptable for Xpert Xpress SARS-CoV-2/FLU/RSV testing.  Fact Sheet for Patients: EntrepreneurPulse.com.au  Fact Sheet for Healthcare Providers: IncredibleEmployment.be  This test is not yet approved or cleared by the [place] FDA and has been authorized for detection and/or diagnosis of SARS-CoV-2 by FDA under an Emergency Use Authorization (EUA). This EUA will remain in effect (meaning this test can be used) for the duration of the COVID-19 declaration under Section 564(b)(1) of the Act, 21 U.S.C. section 360bbb-3(b)(1), unless the authorization is terminated or revoked.  Performed at [facility], [place] [address] [place], [place] [address]     Labs: CBC: Recent Labs  Lab 05/08/21 1634 05/09/21 0357 05/10/21 0622  WBC 7.0 7.4 7.1  NEUTROABS 4.6  --  4.1  HGB 10.4* 9.7* 9.8*  HCT 33.1* 30.7* 31.2*  MCV 80.3 79.5* 80.4  PLT 405* 392 [id number]   Basic Metabolic Panel: Recent Labs  Lab 05/08/21 1634 05/09/21 0357 05/10/21 0622 05/11/21 0605  NA 137 136 137 137  K 4.3 4.0 3.9 3.8  CL 108 107  108 106  CO2 22 21* 21* 22  GLUCOSE 128* 143* 111* 100*  BUN 76* 72* 76* 72*  CREATININE 3.35* 3.23* 3.14* 3.03*  CALCIUM 8.9 8.6* 8.6* 8.5*  MG  --  2.1 2.2  --   PHOS  --   --  4.9*  --    Liver Function Tests: Recent Labs  Lab 05/08/21 1634  AST 19  ALT 17  ALKPHOS 73  BILITOT 1.0  PROT 7.8  ALBUMIN 3.1*   CBG: Recent Labs  Lab 05/10/21 0740 05/10/21 1122 05/10/21 1628 05/10/21 2116 05/11/21 0728  GLUCAP 107* 108* 137* 110* 93    Discharge time spent: greater than 30 minutes.  Signed: Barb Merino, MD [facility] 05/11/2021

## 2021-05-11 NOTE — Plan of Care (Signed)
  Problem: Activity: Goal: Capacity to carry out activities will improve Outcome: Adequate for Discharge   

## 2021-05-13 ENCOUNTER — Other Ambulatory Visit: Payer: Self-pay

## 2021-05-16 ENCOUNTER — Encounter (HOSPITAL_COMMUNITY): Payer: Self-pay | Admitting: Radiology

## 2021-05-17 ENCOUNTER — Other Ambulatory Visit: Payer: Self-pay

## 2021-05-18 ENCOUNTER — Emergency Department (HOSPITAL_COMMUNITY): Payer: Medicaid Other

## 2021-05-18 ENCOUNTER — Inpatient Hospital Stay (HOSPITAL_COMMUNITY)
Admission: EM | Admit: 2021-05-18 | Discharge: 2021-06-08 | DRG: 286 | Disposition: A | Discharge status: Home / Self Care | Payer: Medicaid Other | Attending: Internal Medicine | Admitting: Internal Medicine

## 2021-05-18 ENCOUNTER — Inpatient Hospital Stay (HOSPITAL_COMMUNITY): Payer: Medicaid Other

## 2021-05-18 ENCOUNTER — Other Ambulatory Visit: Payer: Self-pay

## 2021-05-18 ENCOUNTER — Encounter (HOSPITAL_COMMUNITY): Payer: Self-pay | Admitting: *Deleted

## 2021-05-18 DIAGNOSIS — N189 Chronic kidney disease, unspecified: Secondary | ICD-10-CM | POA: Diagnosis not present

## 2021-05-18 DIAGNOSIS — Z6841 Body Mass Index (BMI) 40.0 and over, adult: Secondary | ICD-10-CM | POA: Diagnosis not present

## 2021-05-18 DIAGNOSIS — I10 Essential (primary) hypertension: Secondary | ICD-10-CM | POA: Diagnosis not present

## 2021-05-18 DIAGNOSIS — I272 Pulmonary hypertension, unspecified: Secondary | ICD-10-CM | POA: Diagnosis not present

## 2021-05-18 DIAGNOSIS — Z20822 Contact with and (suspected) exposure to covid-19: Secondary | ICD-10-CM | POA: Diagnosis present

## 2021-05-18 DIAGNOSIS — R0602 Shortness of breath: Secondary | ICD-10-CM | POA: Diagnosis present

## 2021-05-18 DIAGNOSIS — J45901 Unspecified asthma with (acute) exacerbation: Secondary | ICD-10-CM | POA: Diagnosis present

## 2021-05-18 DIAGNOSIS — J9601 Acute respiratory failure with hypoxia: Secondary | ICD-10-CM | POA: Diagnosis not present

## 2021-05-18 DIAGNOSIS — E1165 Type 2 diabetes mellitus with hyperglycemia: Secondary | ICD-10-CM | POA: Diagnosis not present

## 2021-05-18 DIAGNOSIS — Z8673 Personal history of transient ischemic attack (TIA), and cerebral infarction without residual deficits: Secondary | ICD-10-CM

## 2021-05-18 DIAGNOSIS — E559 Vitamin D deficiency, unspecified: Secondary | ICD-10-CM | POA: Diagnosis present

## 2021-05-18 DIAGNOSIS — N179 Acute kidney failure, unspecified: Secondary | ICD-10-CM

## 2021-05-18 DIAGNOSIS — I77810 Thoracic aortic ectasia: Secondary | ICD-10-CM

## 2021-05-18 DIAGNOSIS — I130 Hypertensive heart and chronic kidney disease with heart failure and stage 1 through stage 4 chronic kidney disease, or unspecified chronic kidney disease: Principal | ICD-10-CM | POA: Diagnosis present

## 2021-05-18 DIAGNOSIS — Z794 Long term (current) use of insulin: Secondary | ICD-10-CM

## 2021-05-18 DIAGNOSIS — I161 Hypertensive emergency: Secondary | ICD-10-CM | POA: Diagnosis present

## 2021-05-18 DIAGNOSIS — E785 Hyperlipidemia, unspecified: Secondary | ICD-10-CM | POA: Diagnosis present

## 2021-05-18 DIAGNOSIS — J9600 Acute respiratory failure, unspecified whether with hypoxia or hypercapnia: Secondary | ICD-10-CM

## 2021-05-18 DIAGNOSIS — I5033 Acute on chronic diastolic (congestive) heart failure: Secondary | ICD-10-CM | POA: Diagnosis present

## 2021-05-18 DIAGNOSIS — I160 Hypertensive urgency: Secondary | ICD-10-CM | POA: Diagnosis not present

## 2021-05-18 DIAGNOSIS — I517 Cardiomegaly: Secondary | ICD-10-CM | POA: Diagnosis not present

## 2021-05-18 DIAGNOSIS — I509 Heart failure, unspecified: Secondary | ICD-10-CM

## 2021-05-18 DIAGNOSIS — I425 Other restrictive cardiomyopathy: Secondary | ICD-10-CM

## 2021-05-18 DIAGNOSIS — E871 Hypo-osmolality and hyponatremia: Secondary | ICD-10-CM | POA: Diagnosis not present

## 2021-05-18 DIAGNOSIS — E669 Obesity, unspecified: Secondary | ICD-10-CM | POA: Diagnosis not present

## 2021-05-18 DIAGNOSIS — D649 Anemia, unspecified: Secondary | ICD-10-CM

## 2021-05-18 DIAGNOSIS — D631 Anemia in chronic kidney disease: Secondary | ICD-10-CM | POA: Diagnosis present

## 2021-05-18 DIAGNOSIS — I5031 Acute diastolic (congestive) heart failure: Secondary | ICD-10-CM | POA: Diagnosis not present

## 2021-05-18 DIAGNOSIS — J80 Acute respiratory distress syndrome: Secondary | ICD-10-CM

## 2021-05-18 DIAGNOSIS — Z7984 Long term (current) use of oral hypoglycemic drugs: Secondary | ICD-10-CM | POA: Diagnosis not present

## 2021-05-18 DIAGNOSIS — Z833 Family history of diabetes mellitus: Secondary | ICD-10-CM

## 2021-05-18 DIAGNOSIS — Z452 Encounter for adjustment and management of vascular access device: Secondary | ICD-10-CM

## 2021-05-18 DIAGNOSIS — Z87891 Personal history of nicotine dependence: Secondary | ICD-10-CM

## 2021-05-18 DIAGNOSIS — I2510 Atherosclerotic heart disease of native coronary artery without angina pectoris: Secondary | ICD-10-CM | POA: Diagnosis present

## 2021-05-18 DIAGNOSIS — E1122 Type 2 diabetes mellitus with diabetic chronic kidney disease: Secondary | ICD-10-CM | POA: Diagnosis present

## 2021-05-18 DIAGNOSIS — Z79899 Other long term (current) drug therapy: Secondary | ICD-10-CM

## 2021-05-18 DIAGNOSIS — J9602 Acute respiratory failure with hypercapnia: Secondary | ICD-10-CM | POA: Diagnosis not present

## 2021-05-18 DIAGNOSIS — I2722 Pulmonary hypertension due to left heart disease: Secondary | ICD-10-CM | POA: Diagnosis present

## 2021-05-18 DIAGNOSIS — Z8249 Family history of ischemic heart disease and other diseases of the circulatory system: Secondary | ICD-10-CM

## 2021-05-18 DIAGNOSIS — R0600 Dyspnea, unspecified: Secondary | ICD-10-CM

## 2021-05-18 DIAGNOSIS — E874 Mixed disorder of acid-base balance: Secondary | ICD-10-CM | POA: Diagnosis present

## 2021-05-18 DIAGNOSIS — E875 Hyperkalemia: Secondary | ICD-10-CM | POA: Diagnosis not present

## 2021-05-18 DIAGNOSIS — Z7982 Long term (current) use of aspirin: Secondary | ICD-10-CM | POA: Diagnosis not present

## 2021-05-18 DIAGNOSIS — N1832 Chronic kidney disease, stage 3b: Secondary | ICD-10-CM | POA: Diagnosis present

## 2021-05-18 DIAGNOSIS — I5041 Acute combined systolic (congestive) and diastolic (congestive) heart failure: Secondary | ICD-10-CM | POA: Diagnosis not present

## 2021-05-18 DIAGNOSIS — R3129 Other microscopic hematuria: Secondary | ICD-10-CM | POA: Diagnosis not present

## 2021-05-18 DIAGNOSIS — N183 Chronic kidney disease, stage 3 unspecified: Secondary | ICD-10-CM | POA: Diagnosis not present

## 2021-05-18 LAB — RESP PANEL BY RT-PCR (FLU A&B, COVID) ARPGX2
Influenza A by PCR: NEGATIVE
Influenza B by PCR: NEGATIVE
SARS Coronavirus 2 by RT PCR: NEGATIVE

## 2021-05-18 LAB — COMPREHENSIVE METABOLIC PANEL
ALT: 21 U/L (ref 0–44)
AST: 24 U/L (ref 15–41)
Albumin: 4 g/dL (ref 3.5–5.0)
Alkaline Phosphatase: 91 U/L (ref 38–126)
Anion gap: 9 (ref 5–15)
BUN: 33 mg/dL — ABNORMAL HIGH (ref 6–20)
CO2: 24 mmol/L (ref 22–32)
Calcium: 8.8 mg/dL — ABNORMAL LOW (ref 8.9–10.3)
Chloride: 106 mmol/L (ref 98–111)
Creatinine, Ser: 2.43 mg/dL — ABNORMAL HIGH (ref 0.61–1.24)
GFR, Estimated: 33 mL/min — ABNORMAL LOW (ref >60)
Glucose, Bld: 225 mg/dL — ABNORMAL HIGH (ref 70–99)
Potassium: 3.7 mmol/L (ref 3.5–5.1)
Sodium: 139 mmol/L (ref 135–145)
Total Bilirubin: 0.9 mg/dL (ref 0.3–1.2)
Total Protein: 9.1 g/dL — ABNORMAL HIGH (ref 6.5–8.1)

## 2021-05-18 LAB — CBC
HCT: 38.7 % — ABNORMAL LOW (ref 39.0–52.0)
HCT: 33.9 % — ABNORMAL LOW (ref 39.0–52.0)
Hemoglobin: 11.4 g/dL — ABNORMAL LOW (ref 13.0–17.0)
Hemoglobin: 10.2 g/dL — ABNORMAL LOW (ref 13.0–17.0)
MCH: 25.2 pg — ABNORMAL LOW (ref 26.0–34.0)
MCH: 25.7 pg — ABNORMAL LOW (ref 26.0–34.0)
MCHC: 29.5 g/dL — ABNORMAL LOW (ref 30.0–36.0)
MCHC: 30.1 g/dL (ref 30.0–36.0)
MCV: 85.4 fL (ref 80.0–100.0)
MCV: 85.4 fL (ref 80.0–100.0)
Platelets: 377 10*3/uL (ref 150–400)
Platelets: 326 10*3/uL (ref 150–400)
RBC: 4.53 MIL/uL (ref 4.22–5.81)
RBC: 3.97 MIL/uL — ABNORMAL LOW (ref 4.22–5.81)
RDW: 18.1 % — ABNORMAL HIGH (ref 11.5–15.5)
RDW: 17.7 % — ABNORMAL HIGH (ref 11.5–15.5)
WBC: 15.3 10*3/uL — ABNORMAL HIGH (ref 4.0–10.5)
WBC: 15.9 10*3/uL — ABNORMAL HIGH (ref 4.0–10.5)
nRBC: 0 % (ref 0.0–0.2)
nRBC: 0 % (ref 0.0–0.2)

## 2021-05-18 LAB — BLOOD GAS, ARTERIAL
Acid-base deficit: 2.8 mmol/L — ABNORMAL HIGH (ref 0.0–2.0)
Bicarbonate: 22.9 mmol/L (ref 20.0–28.0)
FIO2: 100
Mode: POSITIVE
O2 Saturation: 92.4 %
Patient temperature: 98.6
RATE: 20 resp/min
pCO2 arterial: 46.7 mmHg (ref 32.0–48.0)
pH, Arterial: 7.312 — ABNORMAL LOW (ref 7.350–7.450)
pO2, Arterial: 73.7 mmHg — ABNORMAL LOW (ref 83.0–108.0)

## 2021-05-18 LAB — ECHOCARDIOGRAM COMPLETE
Area-P 1/2: 4.15 cm2
Height: 68 in
S' Lateral: 3.4 cm
Single Plane A4C EF: 71.6 %
Weight: 4239.89 oz

## 2021-05-18 LAB — BLOOD GAS, VENOUS
Acid-base deficit: 5.3 mmol/L — ABNORMAL HIGH (ref 0.0–2.0)
Bicarbonate: 23.4 mmol/L (ref 20.0–28.0)
O2 Saturation: 91.5 %
Patient temperature: 98.6
pCO2, Ven: 64.2 mmHg — ABNORMAL HIGH (ref 44.0–60.0)
pH, Ven: 7.187 — CL (ref 7.250–7.430)
pO2, Ven: 83.9 mmHg — ABNORMAL HIGH (ref 32.0–45.0)

## 2021-05-18 LAB — BASIC METABOLIC PANEL
Anion gap: 8 (ref 5–15)
Anion gap: 7 (ref 5–15)
Anion gap: 9 (ref 5–15)
BUN: 40 mg/dL — ABNORMAL HIGH (ref 6–20)
BUN: 42 mg/dL — ABNORMAL HIGH (ref 6–20)
BUN: 50 mg/dL — ABNORMAL HIGH (ref 6–20)
CO2: 22 mmol/L (ref 22–32)
CO2: 21 mmol/L — ABNORMAL LOW (ref 22–32)
CO2: 20 mmol/L — ABNORMAL LOW (ref 22–32)
Calcium: 8.4 mg/dL — ABNORMAL LOW (ref 8.9–10.3)
Calcium: 8.2 mg/dL — ABNORMAL LOW (ref 8.9–10.3)
Calcium: 8.3 mg/dL — ABNORMAL LOW (ref 8.9–10.3)
Chloride: 108 mmol/L (ref 98–111)
Chloride: 109 mmol/L (ref 98–111)
Chloride: 109 mmol/L (ref 98–111)
Creatinine, Ser: 2.39 mg/dL — ABNORMAL HIGH (ref 0.61–1.24)
Creatinine, Ser: 2.63 mg/dL — ABNORMAL HIGH (ref 0.61–1.24)
Creatinine, Ser: 2.97 mg/dL — ABNORMAL HIGH (ref 0.61–1.24)
GFR, Estimated: 34 mL/min — ABNORMAL LOW (ref >60)
GFR, Estimated: 30 mL/min — ABNORMAL LOW (ref >60)
GFR, Estimated: 26 mL/min — ABNORMAL LOW (ref >60)
Glucose, Bld: 196 mg/dL — ABNORMAL HIGH (ref 70–99)
Glucose, Bld: 125 mg/dL — ABNORMAL HIGH (ref 70–99)
Glucose, Bld: 138 mg/dL — ABNORMAL HIGH (ref 70–99)
Potassium: 4.2 mmol/L (ref 3.5–5.1)
Potassium: 4.3 mmol/L (ref 3.5–5.1)
Potassium: 4.4 mmol/L (ref 3.5–5.1)
Sodium: 138 mmol/L (ref 135–145)
Sodium: 137 mmol/L (ref 135–145)
Sodium: 138 mmol/L (ref 135–145)

## 2021-05-18 LAB — RESPIRATORY PANEL BY PCR

## 2021-05-18 LAB — PROTIME-INR
INR: 1.2 (ref 0.8–1.2)
Prothrombin Time: 15.3 seconds — ABNORMAL HIGH (ref 11.4–15.2)

## 2021-05-18 LAB — GLUCOSE, CAPILLARY
Glucose-Capillary: 123 mg/dL — ABNORMAL HIGH (ref 70–99)
Glucose-Capillary: 128 mg/dL — ABNORMAL HIGH (ref 70–99)
Glucose-Capillary: 137 mg/dL — ABNORMAL HIGH (ref 70–99)
Glucose-Capillary: 138 mg/dL — ABNORMAL HIGH (ref 70–99)
Glucose-Capillary: 152 mg/dL — ABNORMAL HIGH (ref 70–99)
Glucose-Capillary: 183 mg/dL — ABNORMAL HIGH (ref 70–99)

## 2021-05-18 LAB — I-STAT CHEM 8, ED
BUN: 30 mg/dL — ABNORMAL HIGH (ref 6–20)
Calcium, Ion: 1.19 mmol/L (ref 1.15–1.40)
Chloride: 108 mmol/L (ref 98–111)
Creatinine, Ser: 2.5 mg/dL — ABNORMAL HIGH (ref 0.61–1.24)
Glucose, Bld: 229 mg/dL — ABNORMAL HIGH (ref 70–99)
HCT: 39 % (ref 39.0–52.0)
Hemoglobin: 13.3 g/dL (ref 13.0–17.0)
Potassium: 3.6 mmol/L (ref 3.5–5.1)
Sodium: 142 mmol/L (ref 135–145)
TCO2: 26 mmol/L (ref 22–32)

## 2021-05-18 LAB — BRAIN NATRIURETIC PEPTIDE
B Natriuretic Peptide: 760.9 pg/mL — ABNORMAL HIGH (ref 0.0–100.0)
B Natriuretic Peptide: 893 pg/mL — ABNORMAL HIGH (ref 0.0–100.0)

## 2021-05-18 LAB — PHOSPHORUS: Phosphorus: 4.2 mg/dL (ref 2.5–4.6)

## 2021-05-18 LAB — LACTIC ACID, PLASMA: Lactic Acid, Venous: 2.8 mmol/L (ref 0.5–1.9)

## 2021-05-18 LAB — RAPID URINE DRUG SCREEN, HOSP PERFORMED
Amphetamines: NOT DETECTED
Barbiturates: NOT DETECTED
Benzodiazepines: NOT DETECTED
Cocaine: NOT DETECTED
Opiates: NOT DETECTED
Tetrahydrocannabinol: NOT DETECTED

## 2021-05-18 LAB — MRSA NEXT GEN BY PCR, NASAL: MRSA by PCR Next Gen: NOT DETECTED

## 2021-05-18 LAB — HEMOGLOBIN A1C
Hgb A1c MFr Bld: 6.7 % — ABNORMAL HIGH (ref 4.8–5.6)
Mean Plasma Glucose: 145.59 mg/dL

## 2021-05-18 LAB — PROCALCITONIN: Procalcitonin: 12.57 ng/mL

## 2021-05-18 LAB — SEDIMENTATION RATE: Sed Rate: 56 mm/hr — ABNORMAL HIGH (ref 0–16)

## 2021-05-18 LAB — TROPONIN I (HIGH SENSITIVITY)
Troponin I (High Sensitivity): 26 ng/L — ABNORMAL HIGH (ref <18)
Troponin I (High Sensitivity): 48 ng/L — ABNORMAL HIGH (ref <18)
Troponin I (High Sensitivity): 68 ng/L — ABNORMAL HIGH (ref <18)

## 2021-05-18 LAB — STREP PNEUMONIAE URINARY ANTIGEN: Strep Pneumo Urinary Antigen: NEGATIVE

## 2021-05-18 LAB — MAGNESIUM: Magnesium: 2.3 mg/dL (ref 1.7–2.4)

## 2021-05-18 MED ORDER — CHLORHEXIDINE GLUCONATE CLOTH 2 % EX PADS
6.0000 | MEDICATED_PAD | Freq: Every day | CUTANEOUS | Status: DC
  Administered 2021-05-18: 6 via TOPICAL

## 2021-05-18 MED ORDER — DOCUSATE SODIUM 100 MG PO CAPS
100.0000 mg | ORAL_CAPSULE | Freq: Two times a day (BID) | ORAL | Status: DC | PRN
  Administered 2021-05-24: 100 mg via ORAL
  Filled 2021-05-18 (×2): qty 1

## 2021-05-18 MED ORDER — CHLORHEXIDINE GLUCONATE 0.12 % MT SOLN: 15.0000 mL | Freq: Two times a day (BID) | OROMUCOSAL | Status: DC

## 2021-05-18 MED ORDER — CHLORHEXIDINE GLUCONATE 0.12 % MT SOLN
15.0000 mL | Freq: Two times a day (BID) | OROMUCOSAL | Status: DC
  Administered 2021-05-19 – 2021-05-26 (×15): 15 mL via OROMUCOSAL
  Filled 2021-05-18 (×13): qty 15

## 2021-05-18 MED ORDER — SODIUM CHLORIDE 0.9% FLUSH
3.0000 mL | Freq: Two times a day (BID) | INTRAVENOUS | Status: DC
Start: 2021-05-18 — End: 2021-06-08
  Administered 2021-05-18 – 2021-06-07 (×33): 3 mL via INTRAVENOUS

## 2021-05-18 MED ORDER — NITROGLYCERIN IN D5W 200-5 MCG/ML-% IV SOLN
INTRAVENOUS | Status: AC
  Administered 2021-05-18: 50 ug/min via INTRAVENOUS
  Filled 2021-05-18: qty 250

## 2021-05-18 MED ORDER — FUROSEMIDE 10 MG/ML IJ SOLN
10.0000 mg/h | INTRAVENOUS | Status: DC
  Administered 2021-05-18: 20 mg/h via INTRAVENOUS
  Administered 2021-05-18 – 2021-05-19 (×2): 10 mg/h via INTRAVENOUS
  Filled 2021-05-18 (×5): qty 20

## 2021-05-18 MED ORDER — CEFEPIME HCL 2 G IJ SOLR
2.0000 g | Freq: Two times a day (BID) | INTRAMUSCULAR | Status: AC
  Administered 2021-05-18 – 2021-05-24 (×14): 2 g via INTRAVENOUS
  Filled 2021-05-18 (×14): qty 2

## 2021-05-18 MED ORDER — PANTOPRAZOLE SODIUM 40 MG IV SOLR
40.0000 mg | Freq: Every day | INTRAVENOUS | Status: DC
  Administered 2021-05-18 – 2021-05-22 (×5): 40 mg via INTRAVENOUS
  Filled 2021-05-18 (×5): qty 10

## 2021-05-18 MED ORDER — HEPARIN SODIUM (PORCINE) 5000 UNIT/ML IJ SOLN
5000.0000 [IU] | Freq: Three times a day (TID) | INTRAMUSCULAR | Status: DC
  Administered 2021-05-18 – 2021-06-08 (×62): 5000 [IU] via SUBCUTANEOUS
  Filled 2021-05-18 (×62): qty 1

## 2021-05-18 MED ORDER — ORAL CARE MOUTH RINSE
15.0000 mL | Freq: Two times a day (BID) | OROMUCOSAL | Status: DC
  Administered 2021-05-22 – 2021-05-26 (×5): 15 mL via OROMUCOSAL

## 2021-05-18 MED ORDER — SODIUM CHLORIDE 0.9% FLUSH: 3.0000 mL | INTRAVENOUS | Status: DC | PRN

## 2021-05-18 MED ORDER — FUROSEMIDE 10 MG/ML IJ SOLN
40.0000 mg | Freq: Once | INTRAMUSCULAR | Status: AC
  Administered 2021-05-18: 40 mg via INTRAVENOUS
  Filled 2021-05-18: qty 4

## 2021-05-18 MED ORDER — FUROSEMIDE 10 MG/ML IJ SOLN
80.0000 mg | Freq: Once | INTRAMUSCULAR | Status: AC
  Administered 2021-05-18: 80 mg via INTRAVENOUS
  Filled 2021-05-18: qty 8

## 2021-05-18 MED ORDER — FUROSEMIDE 10 MG/ML IJ SOLN
120.0000 mg | Freq: Once | INTRAVENOUS | Status: AC
  Administered 2021-05-18: 120 mg via INTRAVENOUS
  Filled 2021-05-18: qty 12

## 2021-05-18 MED ORDER — SODIUM CHLORIDE 0.9 % IV SOLN
250.0000 mL | INTRAVENOUS | Status: DC | PRN
  Administered 2021-05-24: 10:00:00 250 mL via INTRAVENOUS

## 2021-05-18 MED ORDER — CHLORHEXIDINE GLUCONATE CLOTH 2 % EX PADS
6.0000 | MEDICATED_PAD | Freq: Every day | CUTANEOUS | Status: DC
  Administered 2021-05-19 – 2021-06-01 (×15): 6 via TOPICAL

## 2021-05-18 MED ORDER — NITROGLYCERIN IN D5W 200-5 MCG/ML-% IV SOLN
0.0000 ug/min | INTRAVENOUS | Status: DC
  Administered 2021-05-18 – 2021-05-19 (×2): 55 ug/min via INTRAVENOUS
  Administered 2021-05-19: 70 ug/min via INTRAVENOUS
  Filled 2021-05-18 (×3): qty 250

## 2021-05-18 MED ORDER — POLYETHYLENE GLYCOL 3350 17 G PO PACK
17.0000 g | PACK | Freq: Every day | ORAL | Status: DC | PRN
  Filled 2021-05-18: qty 1

## 2021-05-18 MED ORDER — NICARDIPINE HCL IN NACL 20-0.86 MG/200ML-% IV SOLN
3.0000 mg/h | INTRAVENOUS | Status: DC
  Administered 2021-05-18 (×2): 11.5 mg/h via INTRAVENOUS
  Administered 2021-05-18: 6.5 mg/h via INTRAVENOUS
  Administered 2021-05-18: 11.5 mg/h via INTRAVENOUS
  Administered 2021-05-18: 5 mg/h via INTRAVENOUS
  Administered 2021-05-18 (×3): 11.5 mg/h via INTRAVENOUS
  Administered 2021-05-18 – 2021-05-19 (×2): 6.5 mg/h via INTRAVENOUS
  Filled 2021-05-18 (×15): qty 200

## 2021-05-18 MED ORDER — ORAL CARE MOUTH RINSE: 15.0000 mL | Freq: Two times a day (BID) | OROMUCOSAL | Status: DC

## 2021-05-18 MED ORDER — FUROSEMIDE 10 MG/ML IJ SOLN
160.0000 mg | Freq: Three times a day (TID) | INTRAVENOUS | Status: DC
  Administered 2021-05-18: 160 mg via INTRAVENOUS
  Filled 2021-05-18: qty 10
  Filled 2021-05-18: qty 16

## 2021-05-18 MED ORDER — INSULIN ASPART 100 UNIT/ML IJ SOLN
0.0000 [IU] | INTRAMUSCULAR | Status: DC
  Administered 2021-05-18 (×2): 4 [IU] via SUBCUTANEOUS
  Administered 2021-05-18 – 2021-05-19 (×5): 3 [IU] via SUBCUTANEOUS
  Filled 2021-05-18: qty 0.2

## 2021-05-18 NOTE — Progress Notes (Signed)
eLink Physician-Brief Progress Note Patient Name: CAL GINDLESPERGER DOB: [birth year] MRN: [id number]   Date of Service  05/18/2021  HPI/Events of Note  41 year old male who presented with respiratory distress admitted for AHRF secondary to pulmonary edema in setting of hypertensive urgency. Lasix ordered.   Camera check: On cardene 3 and nitro 15 gtt. Tolerating BiPAP  eICU Interventions  Monitor respiratory status Will f/u AM labs and replete K as needed for ongoing diuresis      Intervention Category Evaluation Type: New Patient Evaluation  Jayln Madeira Mechele Collin 05/18/2021, 5:11 AM

## 2021-05-18 NOTE — Progress Notes (Signed)
Pharmacy Antibiotic Note  Micheal Levy is a 41 y.o. male admitted on 05/18/2021 with worsening respiratory status.  CXR with bilateral dense airspace disease, underlying pneumonia is possible.  Recent admit from 2/1-2/4.  Given elevated WBC and severity of infiltrates, will treat empirically for HCAP.  Pharmacy has been consulted for cefepime dosing.  WBC 15.9, afebrile, currently on BiPAP  Plan: Cefepime 2g IV q12 hours Monitor clinical improvement, respiratory status, ability to narrow antibiotics  Height: 5\' 8"  (172.7 cm) Weight: 120.2 kg (264 lb 15.9 oz) IBW/kg (Calculated) : 68.4  Temp (24hrs), Avg:97.5 F (36.4 C), Min:97 F (36.1 C), Max:97.9 F (36.6 C)  Recent Labs  Lab 05/18/21 0008 05/18/21 0015 05/18/21 0020 05/18/21 0606  WBC 15.3*  --   --  15.9*  CREATININE 2.43*  --  2.50* 2.39*  LATICACIDVEN  --  2.8*  --   --     Estimated Creatinine Clearance: 50.7 mL/min (A) (by C-G formula based on SCr of 2.39 mg/dL (H)).    No Known Allergies  Antimicrobials this admission: Cefepime 2/11 >>   Dose adjustments this admission: N/a  Microbiology results: 2/11 MRSA PCR: not detected 2/11 Strep pneumo: 2/11 Legionella:  2/11 Resp panel:  2/11 COVID and influenza negative  Thank you for allowing pharmacy to be a part of this patients care.  4/11, PharmD 05/18/2021 9:33 AM

## 2021-05-18 NOTE — ED Notes (Signed)
Blood pressures taken in both bilateral arms for comparison.

## 2021-05-18 NOTE — Plan of Care (Signed)
Discussed with patient plan of care for the evening, pain management and mouth care with bipap with some teach back displayed.  Problem: Activity: Goal: Risk for activity intolerance will decrease Outcome: Progressing   Problem: Education: Goal: Knowledge of General Education information will improve Description: Including pain rating scale, medication(s)/side effects and non-pharmacologic comfort measures Outcome: Progressing

## 2021-05-18 NOTE — ED Triage Notes (Signed)
Pt arrives via [facility] from home. Per medic report, the patient has history of asthma. On arrival to the scene, pt was tripoding, diaphoretic, saturations were 74% on room air, diminished breath sounds. En route, IV established in the left AC, pt was given 2gm Mg, 1.5 solumedrol, IM epi, 10 albuterol, 1 atrovent. HR 100, bp 180/100. CPAP placed for transport. Saturations mid 80's.

## 2021-05-18 NOTE — ED Provider Notes (Signed)
[facility]-EMERGENCY DEPT [facility] Emergency Department Provider Note MRN:  [id number]  Arrival date & time: 05/18/21     Chief Complaint   Shortness of Breath   History of Present Illness   Micheal Levy is a 41 y.o. year-old male with a history of diabetes, stroke, CHF presenting to the ED with chief complaint of shortness of breath.  EMS report of asthma exacerbation, respiratory distress.  Reportedly tripoding on EMS arrival, given Solu-Medrol, DuoNebs, intramuscular epinephrine, magnesium.   Review of Systems  I was unable to obtain a full/accurate HPI, PMH, or ROS due to the patient's respiratory failure/distress.  Patient's Health History    Past Medical History:  Diagnosis Date   Chronic kidney disease    Diabetes mellitus without complication (HCC)    Elevated serum cholesterol 04/2019   Elevated serum creatinine 04/2019   History of seizures    Hyperglycemia    Hypertension    Hypertensive urgency 04/2019   Hypokalemia    Seizures (HCC) 03/2017   x 2    Stroke (HCC) 03/2017   Substance abuse (HCC)    Vitamin D deficiency 04/2019    History reviewed. No pertinent surgical history.  Family History  Problem Relation Age of Onset   Diabetes Father    Heart attack Father    Diabetes Sister    Hypertension Brother     Social History   Socioeconomic History   Marital status: Single    Spouse name: Not on file   Number of children: Not on file   Years of education: Not on file   Highest education level: Not on file  Occupational History   Not on file  Tobacco Use   Smoking status: Former    Types: Cigarettes    Quit date: 04/25/1999    Years since quitting: 22.0   Smokeless tobacco: Never   Tobacco comments:    2001  Vaping Use   Vaping Use: Never used  Substance and Sexual Activity   Alcohol use: Yes    Comment: occ, none since Dec 2018   Drug use: Yes    Types: Cocaine, Marijuana    Comment: weed-daily. cocaine- once or twice a month,  05/25/17 avg 1/2 gm daily, no cocaine since Dec 2018    Sexual activity: Yes  Other Topics Concern   Not on file  Social History Narrative   ** Merged History Encounter **   05/25/17 Lives with girlfriend   05/25/17 currently out of work   Education- GED   Children- 3    no caffeine use   Social Determinants of Corporate investment banker Strain: Not on file  Food Insecurity: Not on file  Transportation Needs: Not on file  Physical Activity: Not on file  Stress: Not on file  Social Connections: Not on file  Intimate Partner Violence: Not on file     Physical Exam   Vitals:   05/18/21 0135 05/18/21 0140  BP: (!) 223/122 (!) 216/131  Pulse: (!) 118 (!) 108  Resp: (!) 36 (!) 38  Temp:    SpO2: 95% 96%    CONSTITUTIONAL: Ill-appearing, diaphoretic, severe respiratory distress NEURO/PSYCH: Mildly somnolent, wakes to voice and answers questions, moves all extremities EYES:  eyes equal and reactive ENT/NECK:  no LAD, no JVD CARDIO: Regular rate, well-perfused, normal S1 and S2 PULM: Diffuse rhonchi GI/GU:  non-distended, non-tender MSK/SPINE:  No gross deformities, prominent pitting edema to bilateral lower extremities SKIN:  no rash, atraumatic   *Additional  and/or pertinent findings included in MDM below  Diagnostic and Interventional Summary    EKG Interpretation  Date/Time:  Saturday May 18 2021 00:10:15 EST Ventricular Rate:  134 PR Interval:  139 QRS Duration: 82 QT Interval:  348 QTC Calculation: 520 R Axis:   44 Text Interpretation: Sinus tachycardia Left atrial enlargement Nonspecific T abnormalities, lateral leads ST elevation, consider anterior injury Prolonged QT interval Confirmed by Kennis Carina [phone]) on 05/18/2021 12:33:53 AM       Labs Reviewed  CBC - Abnormal; Notable for the following components:      Result Value   WBC 15.3 (*)    Hemoglobin 11.4 (*)    HCT 38.7 (*)    MCH 25.2 (*)    MCHC 29.5 (*)    RDW 18.1 (*)    All other  components within normal limits  COMPREHENSIVE METABOLIC PANEL - Abnormal; Notable for the following components:   Glucose, Bld 225 (*)    BUN 33 (*)    Creatinine, Ser 2.43 (*)    Calcium 8.8 (*)    Total Protein 9.1 (*)    GFR, Estimated 33 (*)    All other components within normal limits  BLOOD GAS, VENOUS - Abnormal; Notable for the following components:   pH, Ven 7.187 (*)    pCO2, Ven 64.2 (*)    pO2, Ven 83.9 (*)    Acid-base deficit 5.3 (*)    All other components within normal limits  LACTIC ACID, PLASMA - Abnormal; Notable for the following components:   Lactic Acid, Venous 2.8 (*)    All other components within normal limits  BRAIN NATRIURETIC PEPTIDE - Abnormal; Notable for the following components:   B Natriuretic Peptide 760.9 (*)    All other components within normal limits  PROTIME-INR - Abnormal; Notable for the following components:   Prothrombin Time 15.3 (*)    All other components within normal limits  I-STAT CHEM 8, ED - Abnormal; Notable for the following components:   BUN 30 (*)    Creatinine, Ser 2.50 (*)    Glucose, Bld 229 (*)    All other components within normal limits  TROPONIN I (HIGH SENSITIVITY) - Abnormal; Notable for the following components:   Troponin I (High Sensitivity) 26 (*)    All other components within normal limits  RESP PANEL BY RT-PCR (FLU A&B, COVID) [id number]  TROPONIN I (HIGH SENSITIVITY)    DG Chest Port 1 View  Final Result      Medications  nitroGLYCERIN 50 mg in dextrose 5 % 250 mL (0.2 mg/mL) infusion (100 mcg/min Intravenous Infusion Verify 05/18/21 0136)  furosemide (LASIX) injection 40 mg (40 mg Intravenous Given 05/18/21 0022)     Procedures  /  Critical Care .Critical Care Performed by: Sabas Sous, MD Authorized by: Sabas Sous, MD   Critical care provider statement:    Critical care time (minutes):  45   Critical care was necessary to treat or prevent imminent or life-threatening deterioration of the  following conditions:  Respiratory failure   Critical care was time spent personally by me on the following activities:  Development of treatment plan with patient or surrogate, discussions with consultants, evaluation of patient's response to treatment, examination of patient, ordering and review of laboratory studies, ordering and review of radiographic studies, ordering and performing treatments and interventions, pulse oximetry, re-evaluation of patient's condition and review of old charts  ED Course and Medical Decision Making  Initial Impression and Ddx  Patient arriving in severe respiratory distress, respiratory failure.  Saturations only 80% on CPAP with EMS.  Reported history of asthma and treated as such with EMS however per chart review patient is more of a heart failure patient and his presentation here in the emergency department is more consistent with CHF.  Rhonchi, significant edema to the lower extremities.  Started on BiPAP on arrival with improvement of oxygen saturation.  Started on nitro drip, providing Lasix.  Patient showing some clinical improvement, will monitor closely.  Past medical/surgical history that increases complexity of ED encounter: History of CHF  Interpretation of Diagnostics I personally reviewed the EKG and my interpretation is as follows: Sinus tachycardia with nonspecific changes    Significant opacities noted on chest x-ray suspicious for pulmonary edema.  Labs revealing a respiratory acidosis, elevated BNP.  Patient Reassessment and Ultimate Disposition/Management Patient with some improvement after these interventions but still significantly hypertensive, requiring BiPAP.  Will admit to ICU.  Patient management required discussion with the following services or consulting groups:  Intensivist Service  Complexity of Problems Addressed Acute illness or injury that poses threat of life of bodily function  Additional Data Reviewed and Analyzed Further  history obtained from: EMS on arrival and Further history from spouse/family member  Additional Factors Impacting ED Encounter Risk Consideration of hospitalization  Elmer Sow. Pilar Plate, MD [facility] [email or web address]  Final Clinical Impressions(s) / ED Diagnoses     ICD-10-CM   1. Acute on chronic congestive heart failure, unspecified heart failure type (HCC)  I50.9     2. Acute respiratory failure with hypoxia and hypercapnia (HCC)  J96.01    J96.02       ED Discharge Orders     None        Discharge Instructions Discussed with and Provided to Patient:   Discharge Instructions   None      Sabas Sous, MD 05/18/21 0150

## 2021-05-18 NOTE — Progress Notes (Signed)
Pt placed on Bipap. Pt is tolerating it well at this time. O2 Saturation 100%. Diminished BS.

## 2021-05-18 NOTE — Progress Notes (Signed)
05/18/2021  Seen and examined. Remains tenuous on BIPAP. Ext with edema, crackles on exam. Probably just decompensated heart failure but with elevated white count and severity of infiltrates should tx empirically for HCAP until we have better handle on resp status. Continue nitro and nicardipine gtt  Start lasix drip, BMP q8h Send rheum panel, ESR. Should he progress to intubation, CVP/Coox/Bronch would be helpful.  Additional 35 min cc time not including any separately billable procedures  Will watch closely Erskine Emery MD PCCM

## 2021-05-18 NOTE — Progress Notes (Signed)
Pt transferred from ED with RN on the BiPAP to [id number] without any complications. Pt is comfortable and saying he feels much better. BiPAP plugged in red outlet. No resp distress noted at this time.

## 2021-05-18 NOTE — Plan of Care (Signed)
Will discuss plan of care for evening, pain management and admission questions with some teach back displayed  Problem: Education: Goal: Knowledge of General Education information will improve Description: Including pain rating scale, medication(s)/side effects and non-pharmacologic comfort measures Outcome: Progressing

## 2021-05-18 NOTE — ED Notes (Signed)
pH 7.18, reported to Dr. Pilar Plate

## 2021-05-18 NOTE — Progress Notes (Signed)
°  Echocardiogram 2D Echocardiogram has been performed.  Micheal Levy 05/18/2021, 8:33 AM

## 2021-05-18 NOTE — H&P (Signed)
NAME:  Micheal Levy, MRN:  [id number], DOB:  04/04/1981, LOS: 0 ADMISSION DATE:  05/18/2021, CONSULTATION DATE: 05/18/2021  REFERRING MD:  Dr Viona Gilmore of [place] [facility], CHIEF COMPLAINT:  acute resp failure   History of Present Illness:  Micheal Levy is a 41 year old morbidly obese male with chronic diastolic heart failure ejection fraction 55-60% and grade 3 diastolic dysfunction on transthoracic echo October 2022, history of previous CVA not otherwise specified, CKD stage IIIa [baseline creatinine running mid twos in 2022] hypertension, hyperlipidemia, type 2 diabetes on Lantus and history of substance abuse not otherwise specified [no urine toxicology seen in the medical chart].  He was just hospitalized approximately a week ago between 05/08/2021 through 05/11/2021 with acute on chronic diastolic heart failure and acute on chronic kidney injury [creatinine in the threes] in the setting of essential hypertension.  Renal ultrasound was fine.  Presenting symptoms were dyspnea paroxysmal nocturnal dyspnea worsening pedal edema asymptomatic pulmonary edema and elevated BNP.  He was treated with Lasix twice daily 40 mg.  At the time of discharge he was able to lie flat and able to walk around without shortness of breath and at discharge still had 1+ pedal edema which is chronic.  He again presented 05/18/2021 with sudden onset of worsening respiratory distress.  Treated as asthma exacerbation with epinephrine by EMS but was found in the ER to be very hypertensive with systolic blood pressure [id number] and at times systolic [id number].  ABG with respiratory acidosis mixed picture with saturation in the 80s.  Chest x-ray looking went significant crackles.  Started on nitro drip and subsequently nicardipine drip to get to systolic goal Q000111Q.  Lasix 40 mg x 1 given.  Course showed some improvement in the ER and critical care medicine called for admission.  It appears that his edema might of gotten worse  No history of fever or  infectious symptoms.    Past Medical History:    has a past medical history of Chronic kidney disease, Diabetes mellitus without complication ([place]), Elevated serum cholesterol (04/2019), Elevated serum creatinine (04/2019), History of seizures, Hyperglycemia, Hypertension, Hypertensive urgency (04/2019), Hypokalemia, Seizures ([place]) (03/2017), Stroke ([place]) (03/2017), Substance abuse ([place]), and Vitamin D deficiency (04/2019).   reports that he quit smoking about 22 years ago. His smoking use included cigarettes. He has never used smokeless tobacco.  History reviewed. No pertinent surgical history.  No Known Allergies  Immunization History  Administered Date(s) Administered   Influenza,inj,Quad PF,6+ Mos 03/23/2017   Pneumococcal Polysaccharide-23 03/23/2017   Tdap 05/04/2017    Family History  Problem Relation Age of Onset   Diabetes Father    Heart attack Father    Diabetes Sister    Hypertension Brother      Current Facility-Administered Medications:    0.9 %  sodium chloride infusion, 250 mL, Intravenous, PRN, Brand Males, MD   docusate sodium (COLACE) capsule 100 mg, 100 mg, Oral, BID PRN, Brand Males, MD   heparin injection 5,000 Units, 5,000 Units, Subcutaneous, Q8H, Bob Daversa, MD   nicardipine (CARDENE) 20mg  in 0.86% saline 243ml IV infusion (0.1 mg/ml), 3-15 mg/hr, Intravenous, Continuous, Maudie Flakes, MD, Last Rate: 50 mL/hr at 05/18/21 0304, 5 mg/hr at 05/18/21 0304   nitroGLYCERIN 50 mg in dextrose 5 % 250 mL (0.2 mg/mL) infusion, 0-200 mcg/min, Intravenous, Continuous, Maudie Flakes, MD, Last Rate: 7.5 mL/hr at 05/18/21 0327, 25 mcg/min at 05/18/21 0327   pantoprazole (PROTONIX) injection 40 mg, 40 mg, Intravenous, QHS, Nicholos Aloisi,  MD   polyethylene glycol (MIRALAX / GLYCOLAX) packet 17 g, 17 g, Oral, Daily PRN, Chase Caller, Stormey Wilborn, MD   sodium chloride flush (NS) 0.9 % injection 3 mL, 3 mL, Intravenous, Q12H, Wilfrid Hyser, MD    sodium chloride flush (NS) 0.9 % injection 3 mL, 3 mL, Intravenous, PRN, Brand Males, MD  Current Outpatient Medications:    albuterol (VENTOLIN HFA) 108 (90 Base) MCG/ACT inhaler, Inhale 2 puffs into the lungs every 6 (six) hours as needed for wheezing or shortness of breath., Disp: 8 g, Rfl: 12   amLODipine (NORVASC) 10 MG tablet, Take 1 tablet (10 mg total) by mouth daily., Disp: 30 tablet, Rfl: 1   aspirin EC 81 MG tablet, Take 1 tablet (81 mg total) by mouth daily., Disp: 30 tablet, Rfl: 0   atorvastatin (LIPITOR) 10 MG tablet, Take 1 tablet (10 mg total) by mouth daily., Disp: 30 tablet, Rfl: 1   Blood Glucose Monitoring Suppl (TRUE METRIX AIR GLUCOSE METER) DEVI, 1 each by Does not apply route 4 (four) times daily -  before meals and at bedtime., Disp: 1 each, Rfl: 0   carvedilol (COREG) 25 MG tablet, Take 1 tablet (25 mg total) by mouth 2 (two) times daily with a meal., Disp: 60 tablet, Rfl: 1   furosemide (LASIX) 40 MG tablet, Take 1 tablet (40 mg total) by mouth 2 (two) times daily., Disp: 120 tablet, Rfl: 0   glipiZIDE (GLUCOTROL XL) 10 MG 24 hr tablet, Take 1 tablet (10 mg total) by mouth daily with breakfast., Disp: 30 tablet, Rfl: 0   glucose blood (TRUE METRIX BLOOD GLUCOSE TEST) test strip, Use as instructed (Patient taking differently: 1 each by Other route as directed.), Disp: 100 each, Rfl: 12   hydrALAZINE (APRESOLINE) 100 MG tablet, Take 1 tablet (100 mg total) by mouth 3 (three) times daily., Disp: 90 tablet, Rfl: 1   insulin glargine (LANTUS SOLOSTAR) 100 UNIT/ML Solostar Pen, Inject 5 Units into the skin daily at 10 pm. (Patient taking differently: Inject 5 Units into the skin at bedtime.), Disp: 1.5 mL, Rfl: 0   Insulin Pen Needle (PEN NEEDLES) 31G X 6 MM MISC, 1 each by Does not apply route at bedtime. (Patient not taking: Reported on 02/26/2021), Disp: 100 each, Rfl: 12   INSULIN SYRINGE .5CC/29G (B-D INS SYR ULTRAFINE .5CC/29G) 29G X 1/2" 0.5 ML MISC, 1 each by Does  not apply route at bedtime. (Patient not taking: Reported on 01/12/2021), Disp: 100 each, Rfl: 5   isosorbide mononitrate (IMDUR) 60 MG 24 hr tablet, Take 1 tablet (60 mg total) by mouth daily., Disp: 30 tablet, Rfl: 1   Lancets MISC, 1 each by Does not apply route 4 (four) times daily -  before meals and at bedtime. (Patient not taking: Reported on 01/12/2021), Disp: 100 each, Rfl: 5   Multiple Vitamins-Minerals (MENS ONE DAILY PO), Take 1 tablet by mouth daily., Disp: , Rfl:      Significant Hospital Events:  05/18/2021 - admit 05/18/2020: COVID PCR    Interim History / Subjective:   05/18/2021 - seen in resus B [place]  Objective   Blood pressure (!) 147/83, pulse 86, temperature 97.6 F (36.4 C), temperature source Axillary, resp. rate (!) 27, height 5\' 8"  (1.727 m), weight 103 kg, SpO2 92 %.        Intake/Output Summary (Last 24 hours) at 05/18/2021 0327 Last data filed at 05/18/2021 0327 Gross per 24 hour  Intake 125.11 ml  Output 100 ml  Net  25.11 ml   Filed Weights   05/18/21 0019  Weight: 103 kg    Examination: General: Obese male in ER  HENT: On BiPAP Lungs: CRackkles, RR 34 mildly paradoxical,  Cardiovascular: TAchycarid Abdomen: obese Extremities: +++ edema Neuro: Sleepy but arousable GU: not examineed  Resolved Hospital Problem list   x  Assessment & Plan:    Acute hypoxemic respiratory failure with mixed metabolic and respiratory acidosis: Severe with bilateral diffuse infiltrates -due to acute on chronic diastolic heart failure - Present on Admit   05/18/2021 -> somewhat improved with BiPAP but still tenuous in the ER  P:   BiPAP Intubate if worse Head of bed greater than 30 degrees    History of stroke not otherwise specified - Present on Admit   - [id number] -unable to elicit any focal neurologic deficit  P:   Close monitoring     Hypertensive emergency -peak systolic [id number] in the emergency department.  -Cause unknown but substance abuse  and noncompliance could play a role - Present on Admit   - 05/18/2021: On nicardipine and nitroglycerin infusion at the time of admission  P:  Goal systolic blood pressure between 165-180 for 25% reduction Check urine toxicology    Known baseline grade 3 diastolic dysfunction -= BNP baseline appears to be 500 - Prior to & Present on Admit Presenting with acute on chronic diastolic heart failure secondary to hypertensive emergency -761 BNP  - Present on Admit   - 05/18/2021 -features are consistent with acute on chronic diastolic heart failure  P: Lasix 80 mg x 1 stat and then 160 mg 3 times daily     At risk for hospital-acquired pneumonia and respiratory viral infections - Present on Admit    P:   Respiratory virus panel Urine strep Urine Legionella Check procalcitonin Antibiotics if there is signs of sepsis   Chronic kidney disease, baseline creatinine 2.2gm% - Prior to & Present on Admit Mild acute kidney injury creatinine 2.5 mg percent  - Present on Admit   05/18/2021 -did make some urine in the ER   P:  Monitor with Lasix  Mild lactic acidosis at admission  05/18/2021 -lactate 2.3  P Monitor*   :  At risk for electrolyte imbalance especially with Lasix P: Monitor closely and replete    In need of stress ulcer prophylaxis   P:   PPI or H2 blockade  :  Mild anemia at presentation   P:  - PRBC for hgb </= 6.9gm%    - exceptions are   -  if ACS susepcted/confirmed then transfuse for hgb </= 8.0gm%,  or    -  active bleeding with hemodynamic instability, then transfuse regardless of hemoglobin value   At at all times try to transfuse 1 unit prbc as possible with exception of active hemorrhage    T2DM on onsulin  - Prior to & Present on Admit     P:   Check Hgba1 c    Best practice (daily eval):  Diet: npo Pain/Anxiety/Delirium protocol (if indicated): x VAP protocol (if indicated): hhob > 30 deg DVT prophylaxis: heparin GI  prophylaxis: h2 blockad3 Glucose control: ssi Mobility: bed rest Code Status: full Family Communication: none Disposition: move from ER to ICU  Family Updates: none at bedside     [place]   The patient Micheal Levy is critically ill with multiple organ systems failure and requires high complexity decision making for assessment and support, frequent evaluation and titration of  therapies, application of advanced monitoring technologies and extensive interpretation of multiple databases.   Critical Care Time devoted to patient care services described in this note is  60  Minutes. This time reflects time of care of this signee Dr Brand Males. This critical care time does not reflect procedure time, or teaching time or supervisory time of PA/NP/Med student/Med Resident etc but could involve care discussion time      SIGNATURE    Dr. Brand Males, M.D., F.C.C.P,  Pulmonary and Critical Care Medicine Staff Physician, [place] Director - Interstitial Lung Disease  Program  Pulmonary [place] at [place], [place], [address]  NPI Number:  NPI [id number]  Pager: [phone], If no answer  -> Check AMION or Try [phone] Telephone (clinical office): [phone] Telephone (research): [phone]  3:27 AM 05/18/2021   05/18/2021 3:27 AM    LABS    Latest Reference Range & Units 09/05/19 03:05 05/18/21 00:15  pH, Ven 7.250 - 7.430  7.443 (H) 7.187 (LL)  pCO2, Ven 44.0 - 60.0 mmHg 42.6 (L) 64.2 (H)  pO2, Ven 32.0 - 45.0 mmHg 104.0 (H) 83.9 (H)  (LL): Data is critically low (H): Data is abnormally high (L): Data is abnormally low PULMONARY Recent Labs  Lab 05/18/21 0015 05/18/21 0020  HCO3 23.4  --   TCO2  --  26  O2SAT 91.5  --     CBC Recent Labs  Lab 05/18/21 0008 05/18/21 0020  HGB 11.4* 13.3  HCT 38.7* 39.0  WBC 15.3*  --   PLT 377  --     COAGULATION Recent  Labs  Lab 05/18/21 0008  INR 1.2    CARDIAC  No results for input(s): TROPONINI in the last 168 hours. No results for input(s): PROBNP in the last 168 hours.  CHEMISTRY Recent Labs  Lab 05/11/21 0605 05/18/21 0008 05/18/21 0020  NA 137 139 142  K 3.8 3.7 3.6  CL 106 106 108  CO2 22 24  --   GLUCOSE 100* 225* 229*  BUN 72* 33* 30*  CREATININE 3.03* 2.43* 2.50*  CALCIUM 8.5* 8.8*  --    Estimated Creatinine Clearance: 44.8 mL/min (A) (by C-G formula based on SCr of 2.5 mg/dL (H)).   LIVER Recent Labs  Lab 05/18/21 0008  AST 24  ALT 21  ALKPHOS 91  BILITOT 0.9  PROT 9.1*  ALBUMIN 4.0  INR 1.2     INFECTIOUS Recent Labs  Lab 05/18/21 0015  LATICACIDVEN 2.8*     ENDOCRINE CBG (last 3)  No results for input(s): GLUCAP in the last 72 hours.       IMAGING x48h  - image(s) personally visualized  -   highlighted in bold DG Chest Port 1 View  Result Date: 05/18/2021 CLINICAL DATA:  Shortness of breath and hypoxia. EXAM: PORTABLE CHEST 1 VIEW COMPARISON:  PA Lat 05/08/2021. FINDINGS: The heart is moderately enlarged. There is increased central vascular prominence. There are diffuse dense patchy opacities of the lungs with relative peripheral/apical sparing and increasing small right-greater-than-left pleural effusions. Similar findings were present on the prior study but the opacities are much denser today. The mediastinal configuration is stable. Thoracic cage is intact. IMPRESSION: Bilateral dense airspace disease with relative peripheral and apical sparing and enlarging small right-greater-than-left pleural effusions. With central vascular prominence this is most likely due to alveolar edema. Underlying pneumonia is certainly possible. Clinical correlation and radiographic follow-up  recommended. In all other respects no further changes. Electronically Signed   By: Telford Nab M.D.   On: 05/18/2021 00:51

## 2021-05-18 NOTE — Progress Notes (Addendum)
Echo reviewed: hyperdynamic LV, RV with elevated filling pressured.  Cannot determine if also has restrictive pattern like last echo but will assume this is so.    Not really responding to diuretic push.  Maybe this is ARDS?  Really need an idea of wedge here.  Will give more time with lasix drip, his resp status is improved (RR from 40s to 20s) for now and being tx for potential HCAP.  If Cr starts going up will DC lasix and assume this is ARDS.  Should he decompensate a swan would be helpful.  Myrla Halsted MD PCCM

## 2021-05-18 NOTE — Progress Notes (Signed)
ABG collected and send down to lab for analysis. Called lab.  ?

## 2021-05-19 ENCOUNTER — Inpatient Hospital Stay (HOSPITAL_COMMUNITY): Payer: Medicaid Other

## 2021-05-19 DIAGNOSIS — J9601 Acute respiratory failure with hypoxia: Secondary | ICD-10-CM | POA: Diagnosis not present

## 2021-05-19 LAB — BASIC METABOLIC PANEL
Anion gap: 8 (ref 5–15)
Anion gap: 13 (ref 5–15)
Anion gap: 11 (ref 5–15)
BUN: 50 mg/dL — ABNORMAL HIGH (ref 6–20)
BUN: 55 mg/dL — ABNORMAL HIGH (ref 6–20)
BUN: 66 mg/dL — ABNORMAL HIGH (ref 6–20)
CO2: 20 mmol/L — ABNORMAL LOW (ref 22–32)
CO2: 15 mmol/L — ABNORMAL LOW (ref 22–32)
CO2: 18 mmol/L — ABNORMAL LOW (ref 22–32)
Calcium: 8.4 mg/dL — ABNORMAL LOW (ref 8.9–10.3)
Calcium: 8.2 mg/dL — ABNORMAL LOW (ref 8.9–10.3)
Calcium: 8.4 mg/dL — ABNORMAL LOW (ref 8.9–10.3)
Chloride: 111 mmol/L (ref 98–111)
Chloride: 110 mmol/L (ref 98–111)
Chloride: 108 mmol/L (ref 98–111)
Creatinine, Ser: 3.06 mg/dL — ABNORMAL HIGH (ref 0.61–1.24)
Creatinine, Ser: 3.06 mg/dL — ABNORMAL HIGH (ref 0.61–1.24)
Creatinine, Ser: 3.63 mg/dL — ABNORMAL HIGH (ref 0.61–1.24)
GFR, Estimated: 25 mL/min — ABNORMAL LOW (ref >60)
GFR, Estimated: 25 mL/min — ABNORMAL LOW (ref >60)
GFR, Estimated: 21 mL/min — ABNORMAL LOW (ref >60)
Glucose, Bld: 133 mg/dL — ABNORMAL HIGH (ref 70–99)
Glucose, Bld: 153 mg/dL — ABNORMAL HIGH (ref 70–99)
Glucose, Bld: 183 mg/dL — ABNORMAL HIGH (ref 70–99)
Potassium: 4.4 mmol/L (ref 3.5–5.1)
Potassium: 5 mmol/L (ref 3.5–5.1)
Potassium: 4.8 mmol/L (ref 3.5–5.1)
Sodium: 139 mmol/L (ref 135–145)
Sodium: 138 mmol/L (ref 135–145)
Sodium: 137 mmol/L (ref 135–145)

## 2021-05-19 LAB — CBC
HCT: 29.2 % — ABNORMAL LOW (ref 39.0–52.0)
Hemoglobin: 8.7 g/dL — ABNORMAL LOW (ref 13.0–17.0)
MCH: 25.1 pg — ABNORMAL LOW (ref 26.0–34.0)
MCHC: 29.8 g/dL — ABNORMAL LOW (ref 30.0–36.0)
MCV: 84.4 fL (ref 80.0–100.0)
Platelets: 277 10*3/uL (ref 150–400)
RBC: 3.46 MIL/uL — ABNORMAL LOW (ref 4.22–5.81)
RDW: 17.8 % — ABNORMAL HIGH (ref 11.5–15.5)
WBC: 17.2 10*3/uL — ABNORMAL HIGH (ref 4.0–10.5)
nRBC: 0 % (ref 0.0–0.2)

## 2021-05-19 LAB — GLUCOSE, CAPILLARY
Glucose-Capillary: 119 mg/dL — ABNORMAL HIGH (ref 70–99)
Glucose-Capillary: 124 mg/dL — ABNORMAL HIGH (ref 70–99)
Glucose-Capillary: 166 mg/dL — ABNORMAL HIGH (ref 70–99)
Glucose-Capillary: 169 mg/dL — ABNORMAL HIGH (ref 70–99)
Glucose-Capillary: 189 mg/dL — ABNORMAL HIGH (ref 70–99)
Glucose-Capillary: 202 mg/dL — ABNORMAL HIGH (ref 70–99)

## 2021-05-19 LAB — PHOSPHORUS: Phosphorus: 4.5 mg/dL (ref 2.5–4.6)

## 2021-05-19 LAB — MAGNESIUM: Magnesium: 2.2 mg/dL (ref 1.7–2.4)

## 2021-05-19 MED ORDER — INSULIN ASPART 100 UNIT/ML IJ SOLN
0.0000 [IU] | Freq: Three times a day (TID) | INTRAMUSCULAR | Status: DC
  Administered 2021-05-19 – 2021-05-20 (×3): 3 [IU] via SUBCUTANEOUS
  Administered 2021-05-20 (×2): 8 [IU] via SUBCUTANEOUS
  Administered 2021-05-21: 5 [IU] via SUBCUTANEOUS
  Administered 2021-05-21: 8 [IU] via SUBCUTANEOUS
  Administered 2021-05-21: 3 [IU] via SUBCUTANEOUS
  Administered 2021-05-22: 8 [IU] via SUBCUTANEOUS
  Administered 2021-05-22: 5 [IU] via SUBCUTANEOUS

## 2021-05-19 MED ORDER — CARVEDILOL 12.5 MG PO TABS
25.0000 mg | ORAL_TABLET | Freq: Two times a day (BID) | ORAL | Status: DC
Start: 2021-05-19 — End: 2021-05-19

## 2021-05-19 MED ORDER — NICARDIPINE HCL IN NACL 40-0.83 MG/200ML-% IV SOLN
3.0000 mg/h | INTRAVENOUS | Status: DC
  Administered 2021-05-19: 3.25 mg/h via INTRAVENOUS
  Administered 2021-05-19 – 2021-05-20 (×5): 7.5 mg/h via INTRAVENOUS
  Filled 2021-05-19 (×6): qty 200

## 2021-05-19 MED ORDER — CARVEDILOL 25 MG PO TABS
25.0000 mg | ORAL_TABLET | Freq: Two times a day (BID) | ORAL | Status: DC
  Administered 2021-05-19 – 2021-06-08 (×40): 25 mg via ORAL
  Filled 2021-05-19: qty 1
  Filled 2021-05-19 (×2): qty 2
  Filled 2021-05-19: qty 1
  Filled 2021-05-19: qty 2
  Filled 2021-05-19: qty 1
  Filled 2021-05-19: qty 2
  Filled 2021-05-19 (×3): qty 1
  Filled 2021-05-19: qty 2
  Filled 2021-05-19 (×2): qty 1
  Filled 2021-05-19: qty 2
  Filled 2021-05-19: qty 1
  Filled 2021-05-19 (×4): qty 2
  Filled 2021-05-19 (×3): qty 1
  Filled 2021-05-19: qty 2
  Filled 2021-05-19 (×2): qty 1
  Filled 2021-05-19: qty 2
  Filled 2021-05-19: qty 1
  Filled 2021-05-19 (×2): qty 2
  Filled 2021-05-19: qty 1
  Filled 2021-05-19: qty 2
  Filled 2021-05-19 (×2): qty 1
  Filled 2021-05-19: qty 2
  Filled 2021-05-19: qty 1
  Filled 2021-05-19: qty 2
  Filled 2021-05-19: qty 1
  Filled 2021-05-19 (×3): qty 2
  Filled 2021-05-19: qty 1

## 2021-05-19 MED ORDER — METHYLPREDNISOLONE SODIUM SUCC 40 MG IJ SOLR
40.0000 mg | Freq: Two times a day (BID) | INTRAMUSCULAR | Status: DC
  Administered 2021-05-19 – 2021-05-20 (×4): 40 mg via INTRAVENOUS
  Filled 2021-05-19 (×4): qty 1

## 2021-05-19 MED ORDER — ACETAMINOPHEN 325 MG PO TABS
650.0000 mg | ORAL_TABLET | Freq: Four times a day (QID) | ORAL | Status: DC | PRN
  Administered 2021-05-19: 650 mg via ORAL
  Filled 2021-05-19: qty 2

## 2021-05-19 MED ORDER — ISOSORBIDE MONONITRATE ER 60 MG PO TB24
60.0000 mg | ORAL_TABLET | Freq: Every day | ORAL | Status: DC
  Administered 2021-05-19 – 2021-05-20 (×2): 60 mg via ORAL
  Filled 2021-05-19 (×2): qty 1

## 2021-05-19 NOTE — Progress Notes (Signed)
NAME:  Micheal Levy, MRN:  [id number], DOB:  04/18/1980, LOS: 1 ADMISSION DATE:  05/18/2021, CONSULTATION DATE: 05/18/2021  REFERRING MD:  Dr Viona Gilmore of [facility], CHIEF COMPLAINT:  acute resp failure   History of Present Illness:  Micheal Levy is a 41 year old morbidly obese male with chronic diastolic heart failure ejection fraction 55-60% and grade 3 diastolic dysfunction on transthoracic echo October 2022, history of previous CVA not otherwise specified, CKD stage IIIa [baseline creatinine running mid twos in 2022] hypertension, hyperlipidemia, type 2 diabetes on Lantus and history of substance abuse not otherwise specified [no urine toxicology seen in the medical chart].  He was just hospitalized approximately a week ago between 05/08/2021 through 05/11/2021 with acute on chronic diastolic heart failure and acute on chronic kidney injury [creatinine in the threes] in the setting of essential hypertension.  Renal ultrasound was fine.  Presenting symptoms were dyspnea paroxysmal nocturnal dyspnea worsening pedal edema asymptomatic pulmonary edema and elevated BNP.  He was treated with Lasix twice daily 40 mg.  At the time of discharge he was able to lie flat and able to walk around without shortness of breath and at discharge still had 1+ pedal edema which is chronic.  He again presented 05/18/2021 with sudden onset of worsening respiratory distress.  Treated as asthma exacerbation with epinephrine by EMS but was found in the ER to be very hypertensive with systolic blood pressure Q000111Q and at times systolic [id number].  ABG with respiratory acidosis mixed picture with saturation in the 80s.  Chest x-ray looking went significant crackles.  Started on nitro drip and subsequently nicardipine drip to get to systolic goal Q000111Q.  Lasix 40 mg x 1 given.  Course showed some improvement in the ER and critical care medicine called for admission.  It appears that his edema might of gotten worse  No history of fever or  infectious symptoms.  Past Medical History:    has a past medical history of Chronic kidney disease, Diabetes mellitus without complication ([place]), Elevated serum cholesterol (04/2019), Elevated serum creatinine (04/2019), History of seizures, Hyperglycemia, Hypertension, Hypertensive urgency (04/2019), Hypokalemia, Seizures ([place]) (03/2017), Stroke ([place]) (03/2017), Substance abuse ([place]), and Vitamin D deficiency (04/2019).   reports that he quit smoking about 22 years ago. His smoking use included cigarettes. He has never used smokeless tobacco.  History reviewed. No pertinent surgical history.  No Known Allergies  Immunization History  Administered Date(s) Administered   Influenza,inj,Quad PF,6+ Mos 03/23/2017   Pneumococcal Polysaccharide-23 03/23/2017   Tdap 05/04/2017    Family History  Problem Relation Age of Onset   Diabetes Father    Heart attack Father    Diabetes Sister    Hypertension Brother      Current Facility-Administered Medications:    0.9 %  sodium chloride infusion, 250 mL, Intravenous, PRN, Brand Males, MD   ceFEPIme (MAXIPIME) 2 g in sodium chloride 0.9 % 100 mL IVPB, 2 g, Intravenous, Q12H, Dimple Nanas, RPH, Stopped at 05/18/21 2227   chlorhexidine (PERIDEX) 0.12 % solution 15 mL, 15 mL, Mouth Rinse, BID, Ramaswamy, Murali, MD   Chlorhexidine Gluconate Cloth 2 % PADS 6 each, 6 each, Topical, Q0600, Candee Furbish, MD   docusate sodium (COLACE) capsule 100 mg, 100 mg, Oral, BID PRN, Brand Males, MD   furosemide (LASIX) 200 mg in dextrose 5 % 100 mL (2 mg/mL) infusion, 10 mg/hr, Intravenous, Continuous, Candee Furbish, MD, Last Rate: 5 mL/hr at 05/19/21 0517, 10 mg/hr at 05/19/21 [phone]  heparin injection 5,000 Units, 5,000 Units, Subcutaneous, Q8H, Ramaswamy, Murali, MD, 5,000 Units at 05/19/21 0451   insulin aspart (novoLOG) injection 0-20 Units, 0-20 Units, Subcutaneous, Q4H, Ramaswamy, Murali, MD, 3 Units at 05/19/21 0429   MEDLINE  mouth rinse, 15 mL, Mouth Rinse, q12n4p, Ramaswamy, Murali, MD   nicardipine (CARDENE) 40mg  in 0.83% saline 265ml IV DOUBLE STRENGTH infusion (0.2 mg/ml), 3-15 mg/hr, Intravenous, Continuous, Candee Furbish, MD, Last Rate: 25 mL/hr at 05/19/21 0517, 5 mg/hr at 05/19/21 0517   nitroGLYCERIN 50 mg in dextrose 5 % 250 mL (0.2 mg/mL) infusion, 0-200 mcg/min, Intravenous, Continuous, Maudie Flakes, MD, Last Rate: 21 mL/hr at 05/19/21 0517, 70 mcg/min at 05/19/21 0517   pantoprazole (PROTONIX) injection 40 mg, 40 mg, Intravenous, QHS, Ramaswamy, Murali, MD, 40 mg at 05/18/21 2153   polyethylene glycol (MIRALAX / GLYCOLAX) packet 17 g, 17 g, Oral, Daily PRN, Chase Caller, Belva Crome, MD   sodium chloride flush (NS) 0.9 % injection 3 mL, 3 mL, Intravenous, Q12H, Ramaswamy, Murali, MD, 3 mL at 05/18/21 2153   sodium chloride flush (NS) 0.9 % injection 3 mL, 3 mL, Intravenous, PRN, Brand Males, MD     Significant Hospital Events:  05/18/2021 - admit 05/18/2020: COVID PCR    Interim History / Subjective:   05/19/2021 - seen in resus B [place]  Objective   Blood pressure (!) 159/77, pulse 77, temperature 97.6 F (36.4 C), temperature source Axillary, resp. rate (!) 26, height 5\' 8"  (1.727 m), weight 122.7 kg, SpO2 94 %.    FiO2 (%):  [70 %-100 %] 70 %   Intake/Output Summary (Last 24 hours) at 05/19/2021 0716 Last data filed at 05/19/2021 [id number] Gross per 24 hour  Intake 2630.37 ml  Output 575 ml  Net 2055.37 ml    Filed Weights   05/18/21 0019 05/18/21 0500 05/19/21 0515  Weight: 103 kg 120.2 kg 122.7 kg    Examination: No distress Lungs with rhonci Continued LE edema Moves all 4 ext to command Able to speak in full sentences Heart sound with holosysolic murmur and pronounced P2  Not making much urine despite 120 of lasix and 20/hr drip Renal function stable at 3 CXR stable severe bilateral infiltrates Pct elevated  Resolved Hospital Problem list   x  Assessment & Plan:  Acute  hypoxemic respiratory failrue due to ARDS, bilateral pneumonia, question pulmonary edema- elevated Pct, lack of response to diuretics, and air bronchograms on CXR argue more for an infectious vs. Inflammatory process.  He has had identical presentations to this for the past 5 years recurrent. Nonsmoker, no further cocaine use, no unusual exposures.  Only thing I can think that would explain would be hydralazine-induced pneumonitis which I have seen do COP/ARDS type infiltrates Hypertensive urgency- remains on nitro and nicardipine drips Baseline restrictive cardiomyopathy- echo here unchanged, maybe more vigorous LV function DM2, CKD, Prior CVA, HTN  - Stop lasix drip - Cefepime x 7 days - Short course of steroids - Hold hydralazine indefinitely for now - He has edema but has been severely resistant to all trials of diuretics; EF preserved on echo with question of a restrictive type physiology, once resp status improves I think would benefit from [place] to help sort this all out - f/u rheum labs - Switch from BIPAP continuous to qHS and PRN; start PO antihypertensives, wean off gtts  Best practice (daily eval):  Diet: cardiac diet Pain/Anxiety/Delirium protocol (if indicated): N/A VAP protocol (if indicated): hhob > 30 deg DVT prophylaxis: heparin GI  prophylaxis: H2B Glucose control: ssi Mobility: bed rest Code Status: full Family Communication: none Disposition: ICU   48 min cc time Erskine Emery MD PCCM

## 2021-05-19 NOTE — Progress Notes (Signed)
Pharmacy Antibiotic Note  EUAN WANDLER is a 41 y.o. male admitted on 05/18/2021 with worsening respiratory status.  CXR with bilateral dense airspace disease, underlying pneumonia is possible.  Recent admit from 2/1-2/4.  Given elevated WBC and severity of infiltrates, will treat empirically for HCAP.  Pharmacy has been consulted for cefepime dosing.  Plan: Continue cefepime 2g IV q12 hours Monitor clinical improvement, respiratory status Added 7 day stop date per CCM rec's   Dose adjustments unlikely at this time. Will formally sign off consult but continue to monitor patient peripherally.  Height: 5\' 8"  (172.7 cm) Weight: 122.7 kg (270 lb 8.1 oz) IBW/kg (Calculated) : 68.4  Temp (24hrs), Avg:98.3 F (36.8 C), Min:97.6 F (36.4 C), Max:98.9 F (37.2 C)  Recent Labs  Lab 05/18/21 0008 05/18/21 0015 05/18/21 0020 05/18/21 0606 05/18/21 1144 05/18/21 2011 05/19/21 0315  WBC 15.3*  --   --  15.9*  --   --  17.2*  CREATININE 2.43*  --  2.50* 2.39* 2.63* 2.97* 3.06*  LATICACIDVEN  --  2.8*  --   --   --   --   --      Estimated Creatinine Clearance: 40.1 mL/min (A) (by C-G formula based on SCr of 3.06 mg/dL (H)).    No Known Allergies  Antimicrobials this admission: Cefepime 2/11 >> 2/18  Dose adjustments this admission: N/a  Microbiology results: 2/11 MRSA PCR: not detected 2/11 Strep pneumo: negative 2/11 Legionella:  2/11 Resp panel: negative 2/11 COVID and influenza negative  Thank you for allowing pharmacy to be a part of this patients care.  4/11, PharmD 05/19/2021 10:02 AM

## 2021-05-19 NOTE — Progress Notes (Signed)
RT NOTE:  Pt placed on HHFNC per CCMD order. Pt on 30L and 100% with saturations of 94-95%. Pt states he feels comfortable on this at this time. Vitals are stable, RT will continue to monitor.

## 2021-05-19 NOTE — Progress Notes (Signed)
eLink Physician-Brief Progress Note Patient Name: Micheal Levy DOB: 1981/03/11 MRN: [id number]   Date of Service  05/19/2021  HPI/Events of Note  Mild headache while on NTG. No other symptoms.   eICU Interventions  Tylenol ordered PRN     Intervention Category Minor Interventions: Routine modifications to care plan (e.g. PRN medications for pain, fever)  Margaretmary Lombard 05/19/2021, 10:41 PM

## 2021-05-19 NOTE — Progress Notes (Signed)
MD clarified systolic blood pressure goal of less than 160 and to titrate nitroglycerin infusion off first. Will complete as able.

## 2021-05-20 ENCOUNTER — Inpatient Hospital Stay (HOSPITAL_COMMUNITY): Payer: Medicaid Other

## 2021-05-20 ENCOUNTER — Encounter (HOSPITAL_COMMUNITY): Payer: Self-pay | Admitting: Internal Medicine

## 2021-05-20 ENCOUNTER — Encounter (HOSPITAL_COMMUNITY): Payer: Medicaid Other

## 2021-05-20 DIAGNOSIS — J9601 Acute respiratory failure with hypoxia: Secondary | ICD-10-CM | POA: Diagnosis not present

## 2021-05-20 LAB — CBC
HCT: 27.9 % — ABNORMAL LOW (ref 39.0–52.0)
Hemoglobin: 8.9 g/dL — ABNORMAL LOW (ref 13.0–17.0)
MCH: 27.2 pg (ref 26.0–34.0)
MCHC: 31.9 g/dL (ref 30.0–36.0)
MCV: 85.3 fL (ref 80.0–100.0)
Platelets: 265 10*3/uL (ref 150–400)
RBC: 3.27 MIL/uL — ABNORMAL LOW (ref 4.22–5.81)
RDW: 17.9 % — ABNORMAL HIGH (ref 11.5–15.5)
WBC: 15.5 10*3/uL — ABNORMAL HIGH (ref 4.0–10.5)
nRBC: 0.1 % (ref 0.0–0.2)

## 2021-05-20 LAB — BASIC METABOLIC PANEL
Anion gap: 9 (ref 5–15)
Anion gap: 11 (ref 5–15)
Anion gap: 10 (ref 5–15)
BUN: 70 mg/dL — ABNORMAL HIGH (ref 6–20)
BUN: 78 mg/dL — ABNORMAL HIGH (ref 6–20)
BUN: 83 mg/dL — ABNORMAL HIGH (ref 6–20)
CO2: 18 mmol/L — ABNORMAL LOW (ref 22–32)
CO2: 17 mmol/L — ABNORMAL LOW (ref 22–32)
CO2: 18 mmol/L — ABNORMAL LOW (ref 22–32)
Calcium: 8.2 mg/dL — ABNORMAL LOW (ref 8.9–10.3)
Calcium: 8.6 mg/dL — ABNORMAL LOW (ref 8.9–10.3)
Calcium: 8.3 mg/dL — ABNORMAL LOW (ref 8.9–10.3)
Chloride: 107 mmol/L (ref 98–111)
Chloride: 107 mmol/L (ref 98–111)
Chloride: 104 mmol/L (ref 98–111)
Creatinine, Ser: 3.63 mg/dL — ABNORMAL HIGH (ref 0.61–1.24)
Creatinine, Ser: 3.92 mg/dL — ABNORMAL HIGH (ref 0.61–1.24)
Creatinine, Ser: 3.94 mg/dL — ABNORMAL HIGH (ref 0.61–1.24)
GFR, Estimated: 21 mL/min — ABNORMAL LOW (ref >60)
GFR, Estimated: 19 mL/min — ABNORMAL LOW (ref >60)
GFR, Estimated: 19 mL/min — ABNORMAL LOW (ref >60)
Glucose, Bld: 180 mg/dL — ABNORMAL HIGH (ref 70–99)
Glucose, Bld: 285 mg/dL — ABNORMAL HIGH (ref 70–99)
Glucose, Bld: 247 mg/dL — ABNORMAL HIGH (ref 70–99)
Potassium: 5.2 mmol/L — ABNORMAL HIGH (ref 3.5–5.1)
Potassium: 4.7 mmol/L (ref 3.5–5.1)
Potassium: 4.5 mmol/L (ref 3.5–5.1)
Sodium: 134 mmol/L — ABNORMAL LOW (ref 135–145)
Sodium: 135 mmol/L (ref 135–145)
Sodium: 132 mmol/L — ABNORMAL LOW (ref 135–145)

## 2021-05-20 LAB — URINALYSIS, ROUTINE W REFLEX MICROSCOPIC
Bilirubin Urine: NEGATIVE
Glucose, UA: NEGATIVE mg/dL
Ketones, ur: 5 mg/dL — AB
Nitrite: NEGATIVE
Protein, ur: 100 mg/dL — AB
Specific Gravity, Urine: 1.011 (ref 1.005–1.030)
pH: 5 (ref 5.0–8.0)

## 2021-05-20 LAB — MAGNESIUM: Magnesium: 2.4 mg/dL (ref 1.7–2.4)

## 2021-05-20 LAB — GLUCOSE, CAPILLARY
Glucose-Capillary: 172 mg/dL — ABNORMAL HIGH (ref 70–99)
Glucose-Capillary: 300 mg/dL — ABNORMAL HIGH (ref 70–99)
Glucose-Capillary: 285 mg/dL — ABNORMAL HIGH (ref 70–99)
Glucose-Capillary: 206 mg/dL — ABNORMAL HIGH (ref 70–99)

## 2021-05-20 LAB — PROTEIN / CREATININE RATIO, URINE
Creatinine, Urine: 167.01 mg/dL
Protein Creatinine Ratio: 0.4 mg/mg{Cre} — ABNORMAL HIGH (ref 0.00–0.15)
Total Protein, Urine: 67 mg/dL

## 2021-05-20 LAB — LEGIONELLA PNEUMOPHILA SEROGP 1 UR AG: L. pneumophila Serogp 1 Ur Ag: NEGATIVE

## 2021-05-20 LAB — ANA W/REFLEX IF POSITIVE: Anti Nuclear Antibody (ANA): NEGATIVE

## 2021-05-20 LAB — RHEUMATOID FACTOR: Rheumatoid fact SerPl-aCnc: 10.6 IU/mL (ref <14.0)

## 2021-05-20 LAB — PHOSPHORUS: Phosphorus: 5.8 mg/dL — ABNORMAL HIGH (ref 2.5–4.6)

## 2021-05-20 MED ORDER — METOLAZONE 5 MG PO TABS
10.0000 mg | ORAL_TABLET | Freq: Once | ORAL | Status: AC
  Administered 2021-05-20: 10 mg via ORAL
  Filled 2021-05-20: qty 2

## 2021-05-20 MED ORDER — FUROSEMIDE 10 MG/ML IJ SOLN
60.0000 mg | Freq: Once | INTRAMUSCULAR | Status: AC
Start: 2021-05-20 — End: 2021-05-20
  Administered 2021-05-20: 60 mg via INTRAVENOUS
  Filled 2021-05-20: qty 6

## 2021-05-20 MED ORDER — METOPROLOL TARTRATE 5 MG/5ML IV SOLN: 2.5000 mg | Freq: Four times a day (QID) | INTRAVENOUS | Status: DC | PRN

## 2021-05-20 MED ORDER — METOPROLOL TARTRATE 5 MG/5ML IV SOLN
5.0000 mg | Freq: Four times a day (QID) | INTRAVENOUS | Status: DC | PRN
  Administered 2021-05-20 (×2): 5 mg via INTRAVENOUS
  Filled 2021-05-20 (×2): qty 5

## 2021-05-20 MED ORDER — NICARDIPINE HCL IN NACL 20-0.86 MG/200ML-% IV SOLN
3.0000 mg/h | INTRAVENOUS | Status: DC
  Administered 2021-05-20: 5 mg/h via INTRAVENOUS
  Administered 2021-05-21: 7.5 mg/h via INTRAVENOUS
  Administered 2021-05-21: 12.5 mg/h via INTRAVENOUS
  Administered 2021-05-21: 7.5 mg/h via INTRAVENOUS
  Administered 2021-05-21: 15 mg/h via INTRAVENOUS
  Administered 2021-05-21: 7.5 mg/h via INTRAVENOUS
  Administered 2021-05-21: 15 mg/h via INTRAVENOUS
  Administered 2021-05-21: 13 mg/h via INTRAVENOUS
  Administered 2021-05-21: 7.5 mg/h via INTRAVENOUS
  Administered 2021-05-21: 12.5 mg/h via INTRAVENOUS
  Administered 2021-05-21: 10 mg/h via INTRAVENOUS
  Administered 2021-05-22 (×4): 13 mg/h via INTRAVENOUS
  Filled 2021-05-20 (×18): qty 200

## 2021-05-20 MED ORDER — AMLODIPINE BESYLATE 5 MG PO TABS
5.0000 mg | ORAL_TABLET | Freq: Every day | ORAL | Status: DC
  Administered 2021-05-20 – 2021-05-22 (×3): 5 mg via ORAL
  Filled 2021-05-20 (×3): qty 1

## 2021-05-20 MED ORDER — ISOSORBIDE MONONITRATE ER 60 MG PO TB24
120.0000 mg | ORAL_TABLET | Freq: Every day | ORAL | Status: DC
  Administered 2021-05-21 – 2021-06-08 (×19): 120 mg via ORAL
  Filled 2021-05-20 (×19): qty 2

## 2021-05-20 NOTE — Consult Note (Signed)
Nephrology Consult   Assessment/Recommendations:   AKI on CKD3 -baseline Cr overall fluctuant since 01/2021 hospitalization, has been any where between 1.8 - ~3. Underlying CKD likely related to HTN, DM -Cr on presentation 2.4, now stable 3.6 with aggressive diuresis (hyaline casts on urine sediment), suspecting an underlying component of CRS as well. Can attempt lasix with albumin as a trial -echogenic kidneys on u/s but no obstruction 2/13, duplex study pending -UA w/ sediment exam w/ 100 protein on UA (will quantify) and hyaline casts. There is also microscopic hematuria (external foley). Will check UPC and UACR. ANCA panel+ANA pending from 2/11 -if volume status is worsening/refractory to diuretics then will need to consider renal replacement therapy--Mr. Sabra Heck and I discussed this briefly and he agrees to doing this if indicated -Continue to monitor daily Cr, Dose meds for GFR<15 -Monitor Daily I/Os, Daily weight  -Maintain MAP>65 for optimal renal perfusion.  -Agree with holding ACE-I, avoid further nephrotoxins including NSAIDS, Morphine.  Unless absolutely necessary, avoid CT with contrast and/or MRI with gadolinium.     AHRF, secondary to ARDS, possible b/l PNA, questionable pulm edema -no real improvement with diuretics, currently on hold for above -possible hydralazine induced pneumonitis given recurrent admissions-on hold -abx/steroids per primary service. Lasix + metolazone x 1 dose today-spot diuresis moving forward if feasible -consider RHC once resp status is more stable  Metabolic acidosis likely secondary to AKI -bicarb improved from 15 to 18 on 2/12-watch for now, if needed can start nahco3  Hypertension: -avoid hydralazine -off nicardipine and ntg gtt -on imdur and coreg. Consider adding amlodipine if needed  Hyperkalemia -likely related to AKI. Receiving diuretics so will hold off on treating K 5.2. If not improving or if worsening would recommend starting  Lokelma  Normocytic anemia: -Transfuse for Hgb<7 g/dL -per primary  Diabetes Mellitus Type 2 with Hyperglycemia -per primary   Recommendations conveyed to primary service.    [facility] 05/20/2021 10:35 AM   _____________________________________________________________________________________   History of Present Illness: Micheal Levy is a/an 41 y.o. male with a past medical history of CKD, morbid obesity, chronic diastolic heart failure, history of CVA, hypertension, DM 2, history of polysubstance abuse who presents to [facility] with shortness of breath.  Was just admitted for similar issues (diastolic heart failure exacerbation).  Chest x-ray on presentation concerning for edema versus pneumonia.  Was also significantly hypertensive on presentation as well, requiring nicardipine and nitro drip.  Required BiPAP.  Has required high doses of Lasix and eventually Lasix drip was started on 2/11 with no significant improvement therefore stopped especially given rise in Cr and questionable pulm edema. Received lasix+metolazone today and patient reports that he urinated more as compared to before. He also reports that his breathing is slightly better today. He otherwise denies any fevers, chills, chest pain, dizziness, dysuria, hematuria.   Medications:  Current Facility-Administered Medications  Medication Dose Route Frequency Provider Last Rate Last Admin   0.9 %  sodium chloride infusion  250 mL Intravenous PRN Brand Males, MD       acetaminophen (TYLENOL) tablet 650 mg  650 mg Oral Q6H PRN Margaretmary Lombard, MD   650 mg at 05/19/21 2248   carvedilol (COREG) tablet 25 mg  25 mg Oral BID WC Candee Furbish, MD   25 mg at 05/20/21 0917   ceFEPIme (MAXIPIME) 2 g in sodium chloride 0.9 % 100 mL IVPB  2 g Intravenous Q12H Candee Furbish, MD 200 mL/hr at 05/20/21  [id number] Infusion Verify at 05/20/21 0934   chlorhexidine (PERIDEX) 0.12 % solution 15 mL  15 mL Mouth Rinse  BID Brand Males, MD   15 mL at 05/20/21 0917   Chlorhexidine Gluconate Cloth 2 % PADS 6 each  6 each Topical Q0600 Candee Furbish, MD   6 each at 05/19/21 1007   docusate sodium (COLACE) capsule 100 mg  100 mg Oral BID PRN Brand Males, MD       heparin injection 5,000 Units  5,000 Units Subcutaneous Q8H Brand Males, MD   5,000 Units at 05/20/21 0525   insulin aspart (novoLOG) injection 0-15 Units  0-15 Units Subcutaneous TID WC Candee Furbish, MD   3 Units at 05/20/21 [id number]   isosorbide mononitrate (IMDUR) 24 hr tablet 60 mg  60 mg Oral Daily Candee Furbish, MD   60 mg at 05/20/21 0917   MEDLINE mouth rinse  15 mL Mouth Rinse q12n4p Brand Males, MD       methylPREDNISolone sodium succinate (SOLU-MEDROL) 40 mg/mL injection 40 mg  40 mg Intravenous Q12H Candee Furbish, MD   40 mg at 05/20/21 0917   pantoprazole (PROTONIX) injection 40 mg  40 mg Intravenous QHS Brand Males, MD   40 mg at 05/19/21 2203   polyethylene glycol (MIRALAX / GLYCOLAX) packet 17 g  17 g Oral Daily PRN Brand Males, MD       sodium chloride flush (NS) 0.9 % injection 3 mL  3 mL Intravenous Q12H Brand Males, MD   3 mL at 05/20/21 0927   sodium chloride flush (NS) 0.9 % injection 3 mL  3 mL Intravenous PRN Brand Males, MD         ALLERGIES Patient has no known allergies.  MEDICAL HISTORY Past Medical History:  Diagnosis Date   Chronic kidney disease    Diabetes mellitus without complication ([facility])    Elevated serum cholesterol 04/2019   Elevated serum creatinine 04/2019   Hypertension    Hypertensive urgency 04/2019   Seizures ([place]) 03/2017   x 2    Stroke ([place]) 03/2017   Substance abuse ([place])    Vitamin D deficiency 04/2019     SOCIAL HISTORY Social History   Socioeconomic History   Marital status: Single    Spouse name: Not on file   Number of children: Not on file   Years of education: Not on file   Highest education level: Not on file  Occupational  History   Not on file  Tobacco Use   Smoking status: Former    Types: Cigarettes    Quit date: 04/25/1999    Years since quitting: 22.0   Smokeless tobacco: Never   Tobacco comments:    2001  Vaping Use   Vaping Use: Never used  Substance and Sexual Activity   Alcohol use: Yes    Comment: occ, none since Dec 2018   Drug use: Yes    Types: Cocaine, Marijuana    Comment: weed-daily. cocaine- once or twice a month, 05/25/17 avg 1/2 gm daily, no cocaine since Dec 2018    Sexual activity: Yes    Birth control/protection: None  Other Topics Concern   Not on file  Social History Narrative   ** Merged History Encounter **   05/25/17 Lives with girlfriend   05/25/17 currently out of work   Education- GED   Children- 3    no caffeine use   Social Determinants of Radio broadcast assistant Strain: Not on  file  Food Insecurity: Not on file  Transportation Needs: Not on file  Physical Activity: Not on file  Stress: Not on file  Social Connections: Not on file  Intimate Partner Violence: Not on file     FAMILY HISTORY Family History  Problem Relation Age of Onset   Diabetes Father    Heart attack Father    Diabetes Sister    Hypertension Brother      Review of Systems: 12 systems reviewed Otherwise as per HPI, all other systems reviewed and negative  Physical Exam: Vitals:   05/20/21 1015 05/20/21 1030  BP: (!) 148/87   Pulse: 73 69  Resp: (!) 23 (!) 27  Temp:    SpO2: 96% 98%   Total I/O In: 205.3 [I.V.:195.2; IV Piggyback:10] Out: -   Intake/Output Summary (Last 24 hours) at 05/20/2021 1035 Last data filed at 05/20/2021 [id number] Gross per 24 hour  Intake 1955.82 ml  Output 550 ml  Net 1405.82 ml   General: no acute distress HEENT: anicteric sclera, oropharynx clear without lesions CV: regular rate, normal rhythm, no murmurs, no gallops, no rubs Lungs: diminished air entry bibasilar, on hfnc Abd: soft, non-tender, non-distended Skin: no visible lesions or  rashes Psych: alert, engaged, appropriate mood and affect Musculoskeletal: 2+ pitting edema b/l LE's Neuro: awake, alert, normal speech, no gross focal deficits   Test Results Reviewed Lab Results  Component Value Date   NA 134 (L) 05/20/2021   K 5.2 (H) 05/20/2021   CL 107 05/20/2021   CO2 18 (L) 05/20/2021   BUN 70 (H) 05/20/2021   CREATININE 3.63 (H) 05/20/2021   CALCIUM 8.2 (L) 05/20/2021   ALBUMIN 4.0 05/18/2021   PHOS 5.8 (H) 05/20/2021     I have reviewed all relevant outside healthcare records related to the patient's kidney injury.

## 2021-05-20 NOTE — Progress Notes (Signed)
Placed new water bottle on HHF system.

## 2021-05-20 NOTE — Progress Notes (Signed)
Notified by [place] tech, patient will need to be NPO after MN for renal artery [place].    NPO order placed.    Canary Brim, MSN, APRN, NP-C, AGACNP-BC [facility] 05/20/2021, 1:35 PM   Please see [email or web address] for pager details.   From 7A-7P if no response, please call [phone] After hours, please call ELink [phone]

## 2021-05-20 NOTE — Progress Notes (Signed)
Removed PT from BiPAP and placed on HHFNC @ 0910, see flowsheet.

## 2021-05-20 NOTE — Progress Notes (Signed)
NAME:  Micheal Levy, MRN:  [id number], DOB:  1981-03-17, LOS: 2 ADMISSION DATE:  05/18/2021, CONSULTATION DATE: 05/18/2021  REFERRING MD:  Dr. Sedonia Small CHIEF COMPLAINT:  acute resp failure   History of Present Illness:  41 year old morbidly obese male with chronic diastolic heart failure ejection fraction 55-60% and grade 3 diastolic dysfunction on TTE October 2022, history of previous CVA not otherwise specified, CKD stage IIIa [baseline creatinine running mid twos in 2022] hypertension, hyperlipidemia, type 2 diabetes on Lantus and history of substance abuse not otherwise specified [no urine toxicology seen in the medical chart].  He was just hospitalized approximately a week ago between 05/08/2021 through 05/11/2021 with acute on chronic diastolic heart failure and acute on chronic kidney injury [creatinine in the threes] in the setting of essential hypertension.  Renal ultrasound was fine.  Presenting symptoms were dyspnea paroxysmal nocturnal dyspnea worsening pedal edema asymptomatic pulmonary edema and elevated BNP.  He was treated with Lasix twice daily 40 mg.  At the time of discharge he was able to lie flat and able to walk around without shortness of breath and at discharge still had 1+ pedal edema which is chronic.  Returned 2/11 with sudden onset of worsening respiratory distress.  Treated as asthma exacerbation with epinephrine by EMS but was found in the ER to be very hypertensive with systolic blood pressure Q000111Q and at times systolic [id number].  ABG with respiratory acidosis mixed picture with saturation in the 80s.  Chest x-ray appears wet, significant crackles.  Started on nitro drip and subsequently nicardipine drip to get to systolic goal Q000111Q.  Lasix 40 mg x 1 given.  Course showed some improvement in the ER and critical care medicine called for admission. Worsened edema. No history of fever or infectious symptoms.  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   Denies substance use.   Past Medical History:  DM  CKD  Substance Abuse  Seizures  Stroke  HTN  Vitamin D Deficiency   Significant Hospital Events:  2/11 Admit  2/13 On BiPAP 18/6, 60% fiO2  Interim History / Subjective:  Afebrile  Pt reports he is ready to come off bipap  Glucose range 172 - 180  I/O 547ml UOP, +1.4L in last 24 hours   Objective   Blood pressure (!) 143/68, pulse 62, temperature 97.6 F (36.4 C), resp. rate (!) 22, height 5\' 8"  (1.727 m), weight 124.2 kg, SpO2 97 %.    FiO2 (%):  [60 %-100 %] 60 %   Intake/Output Summary (Last 24 hours) at 05/20/2021 0730 Last data filed at 05/20/2021 0500 Gross per 24 hour  Intake 1990.56 ml  Output 550 ml  Net 1440.56 ml   Filed Weights   05/18/21 0500 05/19/21 0515 05/20/21 0500  Weight: 120.2 kg 122.7 kg 124.2 kg    Examination: General: adult male lying in bed in NAD  HEENT: MM pink/dry, BiPAP mask in place, anicteric, good dentition  Neuro: AAOx4, speech clear, MAE CV: s1s2 RRR, no m/r/g PULM: non-labored at rest, lungs bilaterally clear anterior  GI: soft, bsx4 active  Extremities: warm/dry, +2 BLE pre-tibial pitting edema  Skin: no rashes or lesions, multiple tattoos  Resolved Hospital Problem list     Assessment & Plan:   Acute hypoxemic respiratory failrue due to ARDS, bilateral pneumonia, question pulmonary edema- Elevated Pct, lack of response to diuretics, and air bronchograms on CXR argue more for an infectious vs. inflammatory process.  He  has had identical presentations to this for the past 5 years recurrent. Nonsmoker, no further cocaine use, no unusual exposures.  Consider hydralazine-induced pneumonitis can cause COP/ARDS type infiltrates.  Noted nodular / alveolar infiltrates dating back to 01/2021.  -follow up auto immune panel, histone antibody  -continue cefepime D3/14  -continue short course steroids  -follow intermittent CXR  -wean O2 for sats >90% -follow strict I/O's  -continue  BiPAP QHS & PRN daytime sleep (Hx of snoring)  Hypertensive Urgency Hx CVA, HTN -hold hydralazine indefinitely  -discontinue nitro, cardene off MAR  -continue imdur, coreg -ICU monitoring  -continues to be in positive balance, will attempt lasix + metolazone   Baseline Restrictive Cardiomyopathy Echo here unchanged, maybe more vigorous LV function -supportive care for now  -consider RHC once clinically improved  -follow up auto-immune labs  DM2 -SSI TID, moderate scale   CKD Prior renal artery [place] in 01/2021 with normal kidneys, 1-59% stenosis of right / left renal arteries -Trend BMP / urinary output -Replace electrolytes as indicated -Avoid nephrotoxic agents, ensure adequate renal perfusion -assess renal US, renal arteries to assess stenosis   Best practice (daily eval):  Diet: cardiac diet Pain/Anxiety/Delirium protocol (if indicated): N/A VAP protocol (if indicated): hhob > 30 deg DVT prophylaxis: heparin GI prophylaxis: H2B Glucose control: ssi Mobility: bed rest Code Status: full Family Communication: patient updated on plan of care Disposition: ICU  Critical Care Time: 34 minutes   Noe Gens, MSN, APRN, NP-C, AGACNP-BC [facility] 05/20/2021, 7:30 AM   Please see [email or web address] for pager details.   From 7A-7P if no response, please call [phone] After hours, please call ELink [phone]

## 2021-05-20 NOTE — Plan of Care (Signed)
  Problem: Pain Managment: Goal: General experience of comfort will improve Outcome: Progressing   Problem: Safety: Goal: Ability to remain free from injury will improve Outcome: Progressing   Problem: Skin Integrity: Goal: Risk for impaired skin integrity will decrease Outcome: Progressing   

## 2021-05-20 NOTE — Progress Notes (Signed)
eLink Physician-Brief Progress Note Patient Name: AVEN CHRISTEN DOB: October 04, 1980 MRN: [id number]   Date of Service  05/20/2021  HPI/Events of Note  Hypertension - BP = 187/116. HR = 70. Admitted 2/00/2023 with hypertensive urgency with SBP in the 230's. Felt to possible have pneumonitis d/t Hydralazine. Already on a multiple medication regime for hypertension. Amlodipine started today at 3 PM.  eICU Interventions  Plan: Change Metoprolol to 5 mg IV Q 6 hours PRN SBP > 165 or DBP > 105. Hold dose for HR < 60. Restart Nicardipine IV infusion. Titrate to SBP < 165.      Intervention Category Major Interventions: Hypertension - evaluation and management  Khiya Friese Eugene 05/20/2021, 10:52 PM

## 2021-05-21 ENCOUNTER — Inpatient Hospital Stay (HOSPITAL_COMMUNITY): Payer: Medicaid Other

## 2021-05-21 DIAGNOSIS — J9601 Acute respiratory failure with hypoxia: Secondary | ICD-10-CM | POA: Diagnosis not present

## 2021-05-21 DIAGNOSIS — N189 Chronic kidney disease, unspecified: Secondary | ICD-10-CM

## 2021-05-21 LAB — HEMOGLOBIN A1C
Hgb A1c MFr Bld: 6.6 % — ABNORMAL HIGH (ref 4.8–5.6)
Mean Plasma Glucose: 142.72 mg/dL

## 2021-05-21 LAB — GLUCOSE, CAPILLARY
Glucose-Capillary: 197 mg/dL — ABNORMAL HIGH (ref 70–99)
Glucose-Capillary: 248 mg/dL — ABNORMAL HIGH (ref 70–99)
Glucose-Capillary: 247 mg/dL — ABNORMAL HIGH (ref 70–99)

## 2021-05-21 LAB — CBC
HCT: 32.7 % — ABNORMAL LOW (ref 39.0–52.0)
Hemoglobin: 10 g/dL — ABNORMAL LOW (ref 13.0–17.0)
MCH: 25.6 pg — ABNORMAL LOW (ref 26.0–34.0)
MCHC: 30.6 g/dL (ref 30.0–36.0)
MCV: 83.6 fL (ref 80.0–100.0)
Platelets: 263 10*3/uL (ref 150–400)
RBC: 3.91 MIL/uL — ABNORMAL LOW (ref 4.22–5.81)
RDW: 17.5 % — ABNORMAL HIGH (ref 11.5–15.5)
WBC: 14.1 10*3/uL — ABNORMAL HIGH (ref 4.0–10.5)
nRBC: 0.4 % — ABNORMAL HIGH (ref 0.0–0.2)

## 2021-05-21 LAB — COOXEMETRY PANEL
Carboxyhemoglobin: <2.2 % — ABNORMAL HIGH (ref 0.5–1.5)
Methemoglobin: <0.7 % (ref 0.0–1.5)
O2 Saturation: 68.4 %
Total hemoglobin: 7.6 g/dL — ABNORMAL LOW (ref 12.0–16.0)

## 2021-05-21 LAB — BASIC METABOLIC PANEL
Anion gap: 10 (ref 5–15)
BUN: 86 mg/dL — ABNORMAL HIGH (ref 6–20)
CO2: 17 mmol/L — ABNORMAL LOW (ref 22–32)
Calcium: 8.3 mg/dL — ABNORMAL LOW (ref 8.9–10.3)
Chloride: 104 mmol/L (ref 98–111)
Creatinine, Ser: 3.92 mg/dL — ABNORMAL HIGH (ref 0.61–1.24)
GFR, Estimated: 19 mL/min — ABNORMAL LOW (ref >60)
Glucose, Bld: 203 mg/dL — ABNORMAL HIGH (ref 70–99)
Potassium: 4.4 mmol/L (ref 3.5–5.1)
Sodium: 131 mmol/L — ABNORMAL LOW (ref 135–145)

## 2021-05-21 LAB — MAGNESIUM: Magnesium: 2.3 mg/dL (ref 1.7–2.4)

## 2021-05-21 LAB — ANCA TITERS
Atypical P-ANCA titer: <1:20 {titer}
C-ANCA: <1:20 {titer}
P-ANCA: <1:20 {titer}

## 2021-05-21 LAB — PHOSPHORUS: Phosphorus: 5.4 mg/dL — ABNORMAL HIGH (ref 2.5–4.6)

## 2021-05-21 MED ORDER — BUMETANIDE 0.25 MG/ML IJ SOLN
1.0000 mg | Freq: Once | INTRAMUSCULAR | Status: AC
  Administered 2021-05-21: 1 mg via INTRAVENOUS
  Filled 2021-05-21: qty 10

## 2021-05-21 MED ORDER — METHYLPREDNISOLONE SODIUM SUCC 40 MG IJ SOLR
40.0000 mg | Freq: Every day | INTRAMUSCULAR | Status: DC
  Administered 2021-05-21 – 2021-05-25 (×5): 40 mg via INTRAVENOUS
  Filled 2021-05-21 (×5): qty 1

## 2021-05-21 MED ORDER — SODIUM BICARBONATE 650 MG PO TABS
1300.0000 mg | ORAL_TABLET | Freq: Three times a day (TID) | ORAL | Status: DC
  Administered 2021-05-21 – 2021-05-24 (×9): 1300 mg via ORAL
  Filled 2021-05-21 (×9): qty 2

## 2021-05-21 MED ORDER — CLONIDINE HCL 0.1 MG PO TABS
0.2000 mg | ORAL_TABLET | Freq: Two times a day (BID) | ORAL | Status: DC
  Administered 2021-05-21 – 2021-05-22 (×3): 0.2 mg via ORAL
  Filled 2021-05-21 (×3): qty 2

## 2021-05-21 NOTE — Progress Notes (Signed)
SvO2 68%, CVP 17 Picture remains unclear.  Discussed with Dr. Antoine Poche: get chest CT to start.  May need chest RHC and/or cardiac MRI depending on character of infiltrates, appreciate help.  Suspect we are heading toward needing dialysis.  Myrla Halsted MD

## 2021-05-21 NOTE — Progress Notes (Addendum)
NAME:  Micheal Levy, MRN:  [id number], DOB:  [birth year], LOS: 3 ADMISSION DATE:  05/18/2021, CONSULTATION DATE: 05/18/2021  REFERRING MD:  Dr. Sedonia Small CHIEF COMPLAINT:  acute resp failure   History of Present Illness:  41 year old morbidly obese male with chronic diastolic heart failure ejection fraction 55-60% and grade 3 diastolic dysfunction on TTE October 2022, history of previous CVA not otherwise specified, CKD stage IIIa [baseline creatinine running mid twos in 2022] hypertension, hyperlipidemia, type 2 diabetes on Lantus and history of substance abuse not otherwise specified [no urine toxicology seen in the medical chart].  He was just hospitalized approximately a week ago between 05/08/2021 through 05/11/2021 with acute on chronic diastolic heart failure and acute on chronic kidney injury [creatinine in the threes] in the setting of essential hypertension.  Renal ultrasound was fine.  Presenting symptoms were dyspnea paroxysmal nocturnal dyspnea worsening pedal edema asymptomatic pulmonary edema and elevated BNP.  He was treated with Lasix twice daily 40 mg.  At the time of discharge he was able to lie flat and able to walk around without shortness of breath and at discharge still had 1+ pedal edema which is chronic.  Returned 2/11 with sudden onset of worsening respiratory distress.  Treated as asthma exacerbation with epinephrine by EMS but was found in the ER to be very hypertensive with systolic blood pressure Q000111Q and at times systolic [id number].  ABG with respiratory acidosis mixed picture with saturation in the 80s.  Chest x-ray appears wet, significant crackles.  Started on nitro drip and subsequently nicardipine drip to get to systolic goal Q000111Q.  Lasix 40 mg x 1 given.  Course showed some improvement in the ER and critical care medicine called for admission. Worsened edema. No history of fever or infectious symptoms.  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   Denies substance use.   Past Medical History:  DM  CKD  Substance Abuse  Seizures  Stroke  HTN  Vitamin D Deficiency   Significant Hospital Events:  2/11 Admit  2/13 On BiPAP 18/6, 60% fiO2  Interim History / Subjective:  Afebrile  Restarted on cardene infusion although no PRN's given BiPAP overnight I/O 200 ml UOP, +485 ml in last 24 hours (despite lasix + metolazone) Renal artery [place] completed this am, results pending  Objective   Blood pressure (!) 162/89, pulse 64, temperature 97.7 F (36.5 C), temperature source Axillary, resp. rate (!) 23, height 5\' 8"  (1.727 m), weight 125.1 kg, SpO2 100 %.    FiO2 (%):  [60 %-90 %] 60 %   Intake/Output Summary (Last 24 hours) at 05/21/2021 [id number] Last data filed at 05/20/2021 1100 Gross per 24 hour  Intake 685.26 ml  Output 200 ml  Net 485.26 ml   Filed Weights   05/19/21 0515 05/20/21 0500 05/21/21 0429  Weight: 122.7 kg 124.2 kg 125.1 kg    Examination: General:  adult male sitting up in bed watching TV HEENT: MM pink/moist, good dentition, anicteric  Neuro: AAOx4, speech clear, MAE CV: s1s2 RRR, no m/r/g PULM: non-labored at rest, lungs bilaterally clear anterior, bibasilar crackles GI: soft, bsx4 active  Extremities: warm/dry, 2-3+ BLE pretibial pitting edema  Skin: no rashes or lesions  Resolved Hospital Problem list     Assessment & Plan:   Acute hypoxemic respiratory failrue due to ARDS, bilateral pneumonia, question pulmonary edema- Elevated Pct, lack of response to diuretics, and air bronchograms on CXR argue more for an  infectious vs. inflammatory process.  He has had identical presentations to this for the past 5 years recurrent. Nonsmoker, no further cocaine use, no unusual exposures.  Consider hydralazine-induced pneumonitis can cause COP/ARDS type infiltrates.  Noted nodular / alveolar infiltrates dating back to 01/2021. HIV negative.  -sed rate mildly elevated -follow up additional autoimmune panel  -D4/x  abx  -continue short course steroids, reduce solumedrol to 40 mg QD -follow intermittent CXR  -wean O2 for sats >90% -Strict I/O's > poor response to diuresis  -Continue BiPAP support QHS & PRN (hx of snoring, elevated PA pressures on ECHO)  Hypertensive Urgency Hx CVA, HTN -hold hydralazine indefinitely  -avoiding ACD-I with renal disease  -continue imdur, coreg -add clonidine 0.2mg  BID  -PRN lopressor  -wean cardene gtt off  Baseline Restrictive Cardiomyopathy Echo here unchanged, maybe more vigorous LV function -send kappa light chains  -place central line for CVP, co-ox, pending results may need Cardiology involvement  -supportive care -consider RHC once clinically improved  -follow up autoimmune panel as above   DM2 -SSI, moderate scale   CKD Prior renal artery [place] in 01/2021 with normal kidneys, 1-59% stenosis of right / left renal arteries -follow up renal artery [place] -Trend BMP / urinary output -Replace electrolytes as indicated -Avoid nephrotoxic agents, ensure adequate renal perfusion  Best practice (daily eval):  Diet: cardiac diet Pain/Anxiety/Delirium protocol (if indicated): N/A VAP protocol (if indicated): HOB > 30 deg DVT prophylaxis: heparin GI prophylaxis: H2B Glucose control: SSI Mobility: bed rest Code Status: full Family Communication: patient updated on plan of care 2/14 Disposition: ICU  Critical Care Time:  85  minutes    Noe Gens, MSN, APRN, NP-C, AGACNP-BC [facility] 05/21/2021, 7:22 AM   Please see [email or web address] for pager details.   From 7A-7P if no response, please call [phone] After hours, please call ELink [phone]

## 2021-05-21 NOTE — Progress Notes (Signed)
[facility] Progress Note    Assessment/ Plan:   AKI on CKD3 -baseline Cr overall fluctuant since 01/2021 hospitalization, has been any where between 1.8 - ~3. Underlying CKD likely related to HTN, DM -Cr on presentation 2.4, now 3.9/stable with aggressive diuresis (hyaline casts on urine sediment), suspecting an underlying component of CRS as well if truly with volume, hopefully will get a better idea with central line now -echogenic kidneys on u/s but no obstruction 2/13, duplex study with patent renal arteries bilaterally -UA w/ sediment exam w/ 100 protein on UA (will quantify) and hyaline casts. There is also microscopic hematuria (external foley). Neg ANA, ANCA panel pending from 2/11 -if volume status is worsening/refractory to diuretics then will need to consider renal replacement therapy--Micheal Levy and I discussed this again today and he agrees to doing this if indicated -Continue to monitor daily Cr, Dose meds for GFR<15 -Monitor Daily I/Os, Daily weight  -Maintain MAP>65 for optimal renal perfusion.  -avoid further nephrotoxins including NSAIDS, Morphine.  Unless absolutely necessary, avoid CT with contrast and/or MRI with gadolinium.      AHRF, secondary to ARDS, possible b/l PNA, questionable pulm edema -no real improvement with diuretics, currently on hold for above -possible hydralazine induced pneumonitis given recurrent admissions-on hold -abx/steroids per primary service. Lasix + metolazone yesterday -s/p TLC to accurately measure CVP, coox -consider RHC once resp status is more stable -will trial bumex 1mg  x 1 dose today   Metabolic acidosis likely secondary to AKI -starting nahco3 1300mg  tid  Hyponatremia -hypervolemic -diuresis as above   Hypertension: -avoid hydralazine -off ntg gtt -on imdur, coreg, amlodipine, clonidine   Hyperkalemia -likely related to AKI, improved   Normocytic anemia: -Transfuse for Hgb<7 g/dL -per primary    Diabetes Mellitus Type 2 with Hyperglycemia -per primary     Recommendations conveyed to primary service.   Gean Quint, MD [facility]   Subjective:   Back on nicardipine gtt. Central line placed to get a better idea of CVP/coox. Patient reports that he is urinating a small amounts every 15 minutes. Breathing and swelling are slightly better as per patient. He denies any fevers, chest pain, n/v, dysgeusia, loss of appetite, no shakes/tremors, brain fog, intractable hiccups/pruritis   Objective:   BP (!) 136/49    Pulse (!) 58    Temp 99 F (37.2 C) (Axillary)    Resp (!) 21    Ht 5\' 8"  (1.727 m)    Wt 125.1 kg    SpO2 95%    BMI 41.93 kg/m   Intake/Output Summary (Last 24 hours) at 05/21/2021 1507 Last data filed at 05/21/2021 1422 Gross per 24 hour  Intake 1286.51 ml  Output 675 ml  Net 611.51 ml   Weight change: 0.9 kg  Physical Exam: Gen:nad, sitting up in bed Neck: [facility] TLC CVS:rrr, s1s2 Resp: bibasilar crackles, normal WOB [id number], soft Ext:2+ pitting edema b/l le's Neuro: awake, alert  Imaging: DG Chest 1 View  Result Date: 05/20/2021 CLINICAL DATA:  Acute respiratory distress syndrome. EXAM: CHEST  1 VIEW COMPARISON:  05/19/2021 and CT chest 01/12/2021. FINDINGS: Trachea is midline. Heart is enlarged, stable. Diffuse bilateral airspace consolidation with minimal improvement in right lower lobe aeration. No definite pleural fluid. IMPRESSION: Severe diffuse bilateral airspace opacification, indicative of edema or pneumonia, with slight interval improvement in aeration in the right lower lobe. Electronically Signed   By: Lorin Picket M.D.   On: 05/20/2021 08:13   US RENAL  Result Date:  05/20/2021 CLINICAL DATA:  Acute on chronic kidney disease. EXAM: RENAL / URINARY TRACT ULTRASOUND COMPLETE COMPARISON:  May 09, 2021. FINDINGS: Right Kidney: Renal measurements: 11.8 x 5.5 x 4.7 cm = volume: 158 mL. Increased echogenicity of renal parenchyma is  noted suggesting medical renal disease. No mass or hydronephrosis visualized. Left Kidney: Renal measurements: 11.2 x 5.0 x 4.4 cm = volume: 130 mL. Increased echogenicity of renal parenchyma is noted suggesting medical renal disease. No mass or hydronephrosis visualized. Bladder: Completely decompressed and therefore not well visualized. Other: Mild ascites is noted in the pelvis. IMPRESSION: Increased echogenicity of renal parenchyma is noted bilaterally suggesting medical renal disease. No hydronephrosis or renal obstruction is noted. Mild ascites is noted in the pelvis. Electronically Signed   By: Marijo Conception M.D.   On: 05/20/2021 09:09   DG CHEST PORT 1 VIEW  Result Date: 05/21/2021 CLINICAL DATA:  Central line placement. EXAM: PORTABLE CHEST 1 VIEW COMPARISON:  Chest x-ray from same day at 1024 hours. FINDINGS: Interval repositioning of the left internal jugular central venous catheter with the tip now in the proximal SVC. Unchanged cardiomegaly. Similar right greater than left lung airspace disease and trace right pleural effusion. No pneumothorax. No acute osseous abnormality. IMPRESSION: 1. Interval repositioning of the left internal jugular central venous catheter with the tip now in the proximal SVC. 2. Similar bilateral airspace disease and trace right pleural effusion. Electronically Signed   By: Titus Dubin M.D.   On: 05/21/2021 12:09   DG CHEST PORT 1 VIEW  Result Date: 05/21/2021 CLINICAL DATA:  Central line placement. EXAM: PORTABLE CHEST 1 VIEW COMPARISON:  Chest x-ray from same day at 0431 hours. FINDINGS: New left internal jugular central venous catheter with the tip oriented superiorly into the right brachiocephalic vein. Stable cardiomegaly. Right greater than left lung airspace disease appears mildly improved. Suspected trace right pleural effusion, also unchanged. No pneumothorax. No acute osseous abnormality. IMPRESSION: 1. New left internal jugular central venous catheter  with the tip oriented superiorly into the right brachiocephalic vein. Recommend repositioning. 2. Somewhat improved bilateral airspace disease which could reflect ARDS or pulmonary edema. Electronically Signed   By: Titus Dubin M.D.   On: 05/21/2021 10:43   DG CHEST PORT 1 VIEW  Result Date: 05/21/2021 CLINICAL DATA:  Respiratory failure EXAM: PORTABLE CHEST 1 VIEW COMPARISON:  Radiograph 05/20/2021 FINDINGS: Unchanged, enlarged cardiomediastinal silhouette. There is severe diffuse airspace disease, not significantly changed in comparison to prior exam. There is no large pleural effusion. There is no visible pneumothorax. There is no acute osseous abnormality. IMPRESSION: Severe diffuse airspace disease, not significantly changed from prior exam. This could represent severe pulmonary edema and/or multifocal pneumonia. Electronically Signed   By: Maurine Simmering M.D.   On: 05/21/2021 08:13   VAS US RENAL ARTERY DUPLEX  Result Date: 05/21/2021 ABDOMINAL VISCERAL Patient Name:  Micheal Levy  Date of Exam:   05/21/2021 Medical Rec #: [id number]        Accession #:    [id number] Date of Birth: 1980-11-17        Patient Gender: M Patient Age:   76 years Exam Location:  [facility] Procedure:      VAS US RENAL ARTERY DUPLEX Referring Phys: [id number] [place] -------------------------------------------------------------------------------- Indications: Chronic kidney disease High Risk Factors: Hypertension, hyperlipidemia, Diabetes, prior CVA. Limitations: Air/bowel gas, obesity and On Bipap machine. Comparison Study: Last renal study 01/15/2021 - patent renal artery with 1-59% Performing Technologist: Oda Cogan RDMS,  RVT  Examination Guidelines: A complete evaluation includes B-mode imaging, spectral Doppler, color Doppler, and power Doppler as needed of all accessible portions of each vessel. Bilateral testing is considered an integral part of a complete examination. Limited examinations for  reoccurring indications may be performed as noted.  Duplex Findings: +--------------------+--------+--------+------+--------+  Mesenteric           PSV cm/s EDV cm/s Plaque Comments  +--------------------+--------+--------+------+--------+  Aorta Prox              53                              +--------------------+--------+--------+------+--------+  Celiac Artery Origin   188                              +--------------------+--------+--------+------+--------+  SMA Proximal           186                              +--------------------+--------+--------+------+--------+    +------------------+--------+--------+-------+  Right Renal Artery PSV cm/s EDV cm/s Comment  +------------------+--------+--------+-------+  Origin                64       13             +------------------+--------+--------+-------+  Proximal              60       12             +------------------+--------+--------+-------+  Mid                   83       15             +------------------+--------+--------+-------+  Distal                76       12             +------------------+--------+--------+-------+ +-----------------+--------+--------+-------+  Left Renal Artery PSV cm/s EDV cm/s Comment  +-----------------+--------+--------+-------+  Origin               73       13             +-----------------+--------+--------+-------+  Proximal             68       15             +-----------------+--------+--------+-------+  Mid                  73       13             +-----------------+--------+--------+-------+  Distal               85       9              +-----------------+--------+--------+-------+  Technologist observations: Technically difficult study due to patient's body habitus. Renal arteries are patent without significant stenosis noted. +------------+--------+--------+----+-----------+--------+--------+----+  Right Kidney PSV cm/s EDV cm/s RI   Left Kidney PSV cm/s EDV cm/s RI     +------------+--------+--------+----+-----------+--------+--------+----+  Upper Pole   0        0             Upper Pole  15       6        0.61  +------------+--------+--------+----+-----------+--------+--------+----+  Mid          17       4        0.78 Mid         16       9        0.59  +------------+--------+--------+----+-----------+--------+--------+----+  Lower Pole   20       6        0.71 Lower Pole  0                       +------------+--------+--------+----+-----------+--------+--------+----+  Hilar        31       6        0.81 Hilar       21       5        0.75  +------------+--------+--------+----+-----------+--------+--------+----+ +------------------+-----+------------------+-----+  Right Kidney             Left Kidney               +------------------+-----+------------------+-----+  RAR                      RAR                       +------------------+-----+------------------+-----+  RAR (manual)       1.2   RAR (manual)       1.3    +------------------+-----+------------------+-----+  Cortex                   Cortex                    +------------------+-----+------------------+-----+  Cortex thickness         Corex thickness           +------------------+-----+------------------+-----+  Kidney length (cm) 12.03 Kidney length (cm) 12.76  +------------------+-----+------------------+-----+  Summary: Renal:  Right: Patent renal artery without significant stenosis. Abnormal        right Resistive Index. Normal size right kidney. Left:  Patent renal artery without significant stenosis. Abnormal        left Resisitve Index. Normal size of left kidney.  *See table(s) above for measurements and observations.  Diagnosing physician: Deitra Mayo MD  Electronically signed by Deitra Mayo MD on 05/21/2021 at 1:54:52 PM.    Final     Labs: BMET Recent Labs  Lab 05/18/21 0606 05/18/21 1144 05/19/21 0315 05/19/21 1136 05/19/21 2011 05/20/21 0322 05/20/21 1133 05/20/21 2000  05/21/21 0315  NA 138   < > 139 138 137 134* 135 132* 131*  K 4.2   < > 4.4 5.0 4.8 5.2* 4.7 4.5 4.4  CL 108   < > 111 110 108 107 107 104 104  CO2 22   < > 20* 15* 18* 18* 17* 18* 17*  GLUCOSE 196*   < > 133* 153* 183* 180* 285* 247* 203*  BUN 40*   < > 50* 55* 66* 70* 78* 83* 86*  CREATININE 2.39*   < > 3.06* 3.06* 3.63* 3.63* 3.92* 3.94* 3.92*  CALCIUM 8.4*   < > 8.4* 8.2* 8.4* 8.2* 8.6* 8.3* 8.3*  PHOS 4.2  --  4.5  --   --  5.8*  --   --  5.4*   < > = values in  this interval not displayed.   CBC Recent Labs  Lab 05/18/21 0606 05/19/21 0315 05/20/21 0322 05/21/21 0315  WBC 15.9* 17.2* 15.5* 14.1*  HGB 10.2* 8.7* 8.9* 10.0*  HCT 33.9* 29.2* 27.9* 32.7*  MCV 85.4 84.4 85.3 83.6  PLT 326 277 265 263    Medications:     amLODipine  5 mg Oral Daily   carvedilol  25 mg Oral BID WC   chlorhexidine  15 mL Mouth Rinse BID   Chlorhexidine Gluconate Cloth  6 each Topical Q0600   cloNIDine  0.2 mg Oral BID   heparin  5,000 Units Subcutaneous Q8H   insulin aspart  0-15 Units Subcutaneous TID WC   isosorbide mononitrate  120 mg Oral Daily   mouth rinse  15 mL Mouth Rinse q12n4p   methylPREDNISolone (SOLU-MEDROL) injection  40 mg Intravenous Daily   pantoprazole (PROTONIX) IV  40 mg Intravenous QHS   sodium chloride flush  3 mL Intravenous Q12H      Gean Quint, MD [facility] 05/21/2021, 3:07 PM

## 2021-05-21 NOTE — Procedures (Signed)
Central Venous Catheter Insertion Procedure Note  NICKO DAHER  [id number]  01/28/1981  Date:05/21/21  Time:10:12 AM   Provider Performing:Montray Kliebert Veleta Miners   Procedure: Insertion of Non-tunneled Central Venous [phone]) with US guidance ([id number])   Indication(s) Medication administration  Consent Risks of the procedure as well as the alternatives and risks of each were explained to the patient and/or caregiver.  Consent for the procedure was obtained and is signed in the bedside chart  Anesthesia Topical only with 1% lidocaine   Timeout Verified patient identification, verified procedure, site/side was marked, verified correct patient position, special equipment/implants available, medications/allergies/relevant history reviewed, required imaging and test results available.  Sterile Technique Maximal sterile technique including full sterile barrier drape, hand hygiene, sterile gown, sterile gloves, mask, hair covering, sterile ultrasound probe cover (if used).  Procedure Description Area of catheter insertion was cleaned with chlorhexidine and draped in sterile fashion.  With real-time ultrasound guidance a central venous catheter was placed into the left internal jugular vein. Nonpulsatile blood flow and easy flushing noted in all ports.  The catheter was sutured in place and sterile dressing applied.  Complications/Tolerance None; patient tolerated the procedure well. Chest X-ray is ordered to verify placement for internal jugular or subclavian cannulation.   Chest x-ray is not ordered for femoral cannulation.  EBL Minimal  Specimen(s) None   Canary Brim, MSN, APRN, NP-C, AGACNP-BC [facility] 05/21/2021, 10:13 AM   Please see [email or web address] for pager details.   From 7A-7P if no response, please call [phone] After hours, please call ELink [phone]

## 2021-05-21 NOTE — Progress Notes (Signed)
Removed PT from BiPAP at 0810 and placed on heated high flow 02 system (35 LPM and 60%).

## 2021-05-21 NOTE — Progress Notes (Signed)
Inpatient Diabetes Program Recommendations  AACE/ADA: New Consensus Statement on Inpatient Glycemic Control (2015)  Target Ranges:  Prepandial:   less than 140 mg/dL      Peak postprandial:   less than 180 mg/dL (1-2 hours)      Critically ill patients:  140 - 180 mg/dL   Lab Results  Component Value Date   GLUCAP 197 (H) 05/21/2021   HGBA1C 6.6 (H) 05/21/2021    Review of Glycemic Control  Latest Reference Range & Units 05/20/21 11:57 05/20/21 16:06 05/20/21 21:31 05/21/21 07:43  Glucose-Capillary 70 - 99 mg/dL 130 (H) 865 (H) 784 (H) 197 (H)  (H): Data is abnormally high Diabetes history: Type 2 DM Outpatient Diabetes medications: Lantus 5 units QHS, Metformin 1000 mg BID Current orders for Inpatient glycemic control: Novolog 0-15 units TID Solumedrol 40 mg QD Inpatient Diabetes Program Recommendations:    If steroids to continue: Consider adding Novolog 3 units TID (assuming patient is consuming >50% of meals).   Thanks, Lujean Rave, MSN, RNC-OB Diabetes Coordinator [phone] (8a-5p)

## 2021-05-22 ENCOUNTER — Inpatient Hospital Stay (HOSPITAL_COMMUNITY): Payer: Medicaid Other

## 2021-05-22 DIAGNOSIS — I509 Heart failure, unspecified: Secondary | ICD-10-CM | POA: Diagnosis not present

## 2021-05-22 DIAGNOSIS — N183 Chronic kidney disease, stage 3 unspecified: Secondary | ICD-10-CM | POA: Diagnosis not present

## 2021-05-22 DIAGNOSIS — J80 Acute respiratory distress syndrome: Secondary | ICD-10-CM | POA: Diagnosis not present

## 2021-05-22 LAB — PHOSPHORUS: Phosphorus: 6.6 mg/dL — ABNORMAL HIGH (ref 2.5–4.6)

## 2021-05-22 LAB — BASIC METABOLIC PANEL
Anion gap: 10 (ref 5–15)
BUN: 107 mg/dL — ABNORMAL HIGH (ref 6–20)
CO2: 18 mmol/L — ABNORMAL LOW (ref 22–32)
Calcium: 7.9 mg/dL — ABNORMAL LOW (ref 8.9–10.3)
Chloride: 102 mmol/L (ref 98–111)
Creatinine, Ser: 4.15 mg/dL — ABNORMAL HIGH (ref 0.61–1.24)
GFR, Estimated: 17 mL/min — ABNORMAL LOW (ref >60)
Glucose, Bld: 266 mg/dL — ABNORMAL HIGH (ref 70–99)
Potassium: 4.8 mmol/L (ref 3.5–5.1)
Sodium: 130 mmol/L — ABNORMAL LOW (ref 135–145)

## 2021-05-22 LAB — CBC
HCT: 27.6 % — ABNORMAL LOW (ref 39.0–52.0)
Hemoglobin: 8.6 g/dL — ABNORMAL LOW (ref 13.0–17.0)
MCH: 25.8 pg — ABNORMAL LOW (ref 26.0–34.0)
MCHC: 31.2 g/dL (ref 30.0–36.0)
MCV: 82.9 fL (ref 80.0–100.0)
Platelets: 224 10*3/uL (ref 150–400)
RBC: 3.33 MIL/uL — ABNORMAL LOW (ref 4.22–5.81)
RDW: 17 % — ABNORMAL HIGH (ref 11.5–15.5)
WBC: 9.5 10*3/uL (ref 4.0–10.5)
nRBC: 0.8 % — ABNORMAL HIGH (ref 0.0–0.2)

## 2021-05-22 LAB — GLUCOSE, CAPILLARY
Glucose-Capillary: 275 mg/dL — ABNORMAL HIGH (ref 70–99)
Glucose-Capillary: 235 mg/dL — ABNORMAL HIGH (ref 70–99)
Glucose-Capillary: 278 mg/dL — ABNORMAL HIGH (ref 70–99)
Glucose-Capillary: 257 mg/dL — ABNORMAL HIGH (ref 70–99)
Glucose-Capillary: 215 mg/dL — ABNORMAL HIGH (ref 70–99)

## 2021-05-22 LAB — KAPPA/LAMBDA LIGHT CHAINS
Kappa free light chain: 109.4 mg/L — ABNORMAL HIGH (ref 3.3–19.4)
Kappa, lambda light chain ratio: 2.69 — ABNORMAL HIGH (ref 0.26–1.65)
Lambda free light chains: 40.6 mg/L — ABNORMAL HIGH (ref 5.7–26.3)

## 2021-05-22 LAB — MICROALBUMIN / CREATININE URINE RATIO
Creatinine, Urine: 124.6 mg/dL
Microalb Creat Ratio: 239 mg/g creat — ABNORMAL HIGH (ref 0–29)
Microalb, Ur: 298.2 ug/mL — ABNORMAL HIGH

## 2021-05-22 LAB — HISTONE ANTIBODIES, IGG, BLOOD: DNA-Histone: 0.3 Units (ref 0.0–0.9)

## 2021-05-22 LAB — MAGNESIUM: Magnesium: 2.5 mg/dL — ABNORMAL HIGH (ref 1.7–2.4)

## 2021-05-22 MED ORDER — HYDRALAZINE HCL 50 MG PO TABS: 100.0000 mg | ORAL_TABLET | Freq: Three times a day (TID) | ORAL | Status: DC

## 2021-05-22 MED ORDER — NICARDIPINE HCL IN NACL 40-0.83 MG/200ML-% IV SOLN
3.0000 mg/h | INTRAVENOUS | Status: DC
  Administered 2021-05-22: 10 mg/h via INTRAVENOUS
  Administered 2021-05-22: 13 mg/h via INTRAVENOUS
  Filled 2021-05-22 (×4): qty 200

## 2021-05-22 MED ORDER — INSULIN ASPART 100 UNIT/ML IJ SOLN
0.0000 [IU] | Freq: Every day | INTRAMUSCULAR | Status: DC
  Administered 2021-05-22 – 2021-05-25 (×4): 2 [IU] via SUBCUTANEOUS

## 2021-05-22 MED ORDER — HYDRALAZINE HCL 50 MG PO TABS
100.0000 mg | ORAL_TABLET | Freq: Three times a day (TID) | ORAL | Status: DC
  Administered 2021-05-22 – 2021-05-23 (×3): 100 mg via ORAL
  Filled 2021-05-22 (×4): qty 2

## 2021-05-22 MED ORDER — INSULIN ASPART 100 UNIT/ML IJ SOLN
0.0000 [IU] | Freq: Three times a day (TID) | INTRAMUSCULAR | Status: DC
  Administered 2021-05-22: 11 [IU] via SUBCUTANEOUS
  Administered 2021-05-23: 17:00:00 7 [IU] via SUBCUTANEOUS
  Administered 2021-05-23: 09:00:00 4 [IU] via SUBCUTANEOUS
  Administered 2021-05-23 – 2021-05-24 (×3): 7 [IU] via SUBCUTANEOUS
  Administered 2021-05-24: 4 [IU] via SUBCUTANEOUS
  Administered 2021-05-25 (×3): 7 [IU] via SUBCUTANEOUS
  Administered 2021-05-26 – 2021-05-27 (×4): 4 [IU] via SUBCUTANEOUS
  Administered 2021-05-27 (×2): 3 [IU] via SUBCUTANEOUS
  Administered 2021-05-28 (×2): 4 [IU] via SUBCUTANEOUS
  Administered 2021-05-28 – 2021-05-29 (×3): 3 [IU] via SUBCUTANEOUS
  Administered 2021-05-29: 4 [IU] via SUBCUTANEOUS
  Administered 2021-05-30: 3 [IU] via SUBCUTANEOUS
  Administered 2021-05-30 (×2): 4 [IU] via SUBCUTANEOUS
  Administered 2021-05-31: 3 [IU] via SUBCUTANEOUS
  Administered 2021-06-01: 4 [IU] via SUBCUTANEOUS
  Administered 2021-06-01: 3 [IU] via SUBCUTANEOUS
  Administered 2021-06-02: 7 [IU] via SUBCUTANEOUS
  Administered 2021-06-02: 3 [IU] via SUBCUTANEOUS
  Administered 2021-06-03: 7 [IU] via SUBCUTANEOUS
  Administered 2021-06-03: 3 [IU] via SUBCUTANEOUS
  Administered 2021-06-04 (×2): 4 [IU] via SUBCUTANEOUS
  Administered 2021-06-05: 7 [IU] via SUBCUTANEOUS
  Administered 2021-06-05 – 2021-06-06 (×2): 3 [IU] via SUBCUTANEOUS
  Administered 2021-06-06: 7 [IU] via SUBCUTANEOUS
  Administered 2021-06-07: 3 [IU] via SUBCUTANEOUS
  Administered 2021-06-07: 4 [IU] via SUBCUTANEOUS
  Administered 2021-06-07 – 2021-06-08 (×3): 3 [IU] via SUBCUTANEOUS

## 2021-05-22 MED ORDER — AMLODIPINE BESYLATE 10 MG PO TABS
10.0000 mg | ORAL_TABLET | Freq: Every day | ORAL | Status: DC
  Administered 2021-05-23 – 2021-05-26 (×4): 10 mg via ORAL
  Filled 2021-05-22 (×4): qty 1

## 2021-05-22 MED ORDER — LABETALOL HCL 5 MG/ML IV SOLN
20.0000 mg | INTRAVENOUS | Status: DC | PRN
  Administered 2021-05-23 – 2021-05-25 (×8): 20 mg via INTRAVENOUS
  Filled 2021-05-22 (×8): qty 4

## 2021-05-22 NOTE — TOC Initial Note (Signed)
Transition of Care [facility]) - Initial/Assessment Note    Patient Details  Name: Micheal Levy MRN: [id number] Date of Birth: 10-30-1980  [facility] CM/SW Contact:    Lanier Clam, RN Phone Number: 05/22/2021, 9:30 AM  Clinical Narrative:Monitor for d/c plans.                   Expected Discharge Plan: Home/Self Care Barriers to Discharge: Continued Medical Work up   Patient Goals and CMS Choice Patient states their goals for this hospitalization and ongoing recovery are:: Home CMS Medicare.gov Compare Post Acute Care list provided to:: Patient Choice offered to / list presented to : NA  Expected Discharge Plan and Services Expected Discharge Plan: Home/Self Care   Discharge Planning Services: CM Consult Post Acute Care Choice:  (Home) Living arrangements for the past 2 months: Single Family Home                                      Prior Living Arrangements/Services Living arrangements for the past 2 months: Single Family Home Lives with:: Spouse Patient language and need for interpreter reviewed:: Yes Do you feel safe going back to the place where you live?: Yes      Need for Family Participation in Patient Care: Yes (Comment) Care giver support system in place?: Yes (comment)   Criminal Activity/Legal Involvement Pertinent to Current Situation/Hospitalization: No - Comment as needed  Activities of Daily Living Home Assistive Devices/Equipment: None ADL Screening (condition at time of admission) Patient's cognitive ability adequate to safely complete daily activities?: Yes Is the patient deaf or have difficulty hearing?: No Does the patient have difficulty seeing, even when wearing glasses/contacts?: No Does the patient have difficulty concentrating, remembering, or making decisions?: No Patient able to express need for assistance with ADLs?: Yes Does the patient have difficulty dressing or bathing?: No Independently performs ADLs?: Yes  (appropriate for developmental age) Does the patient have difficulty walking or climbing stairs?: No Weakness of Legs: Both Weakness of Arms/Hands: None  Permission Sought/Granted Permission sought to share information with : Case Manager Permission granted to share information with : Yes, Verbal Permission Granted  Share Information with NAME: Case Manager           Emotional Assessment              Admission diagnosis:  Acute respiratory failure (HCC) [J96.00] Acute respiratory failure with hypoxia and hypercapnia (HCC) [J96.01, J96.02] Acute hypoxemic respiratory failure (HCC) [J96.01] Acute on chronic congestive heart failure, unspecified heart failure type (HCC) [I50.9] Patient Active Problem List   Diagnosis Date Noted   Acute respiratory failure (HCC) 05/18/2021   Acute on chronic diastolic CHF (congestive heart failure) (HCC) 05/08/2021   Acute streptococcal tonsillitis 04/22/2021   Chronic diastolic heart failure (HCC) 04/22/2021   Uncontrolled hypertension 02/26/2021   Medication management 02/26/2021   Class 2 obesity 02/26/2021   Stage 3a chronic kidney disease (HCC) 02/26/2021   CHF exacerbation (HCC) 02/20/2021   Type 2 diabetes mellitus with other specified complication (HCC) 02/20/2021   Dyslipidemia 02/20/2021   Abnormal EKG 02/20/2021   Acute respiratory failure with hypoxia (HCC) 01/12/2021   Chronic kidney disease, stage 3a (HCC)    Acute diastolic CHF (congestive heart failure) (HCC)    Elevated troponin    History of CVA (cerebrovascular accident)    Ankle fracture, bimalleolar, closed, left, initial encounter 01/18/2020  Seizures ([place]) 09/04/2019   Altered mental status    Acute hypoxemic respiratory failure ([place])    Hypertensive urgency 04/21/2019   Essential hypertension 04/21/2019   Uncontrolled type 2 diabetes mellitus with hyperglycemia ([place]) 04/21/2019   Hemoglobin A1C between 7% and 9% indicating borderline diabetic control ([place])  04/21/2019   Incarceration 04/21/2019   History of seizures 04/21/2019   Hypokalemia 04/21/2019   History of substance abuse ([place]) 04/05/2017   Cerebral embolism with cerebral infarction 03/23/2017   Acute kidney injury superimposed on chronic kidney disease [facility])    Hypertensive emergency    Ventilator dependent ([place])    Hyperglycemia 03/21/2017   PCP:  [name], NP Pharmacy:   [facility] [place] [place] [address] Phone: [phone] Fax: [phone]     Social Determinants of Health (SDOH) Interventions    Readmission Risk Interventions No flowsheet data found.

## 2021-05-22 NOTE — Progress Notes (Signed)
Patient's significant other stayed overnight and gave bath to patient. Will continue to monitor.

## 2021-05-22 NOTE — Progress Notes (Signed)
[facility] Progress Note    Assessment/ Plan:   AKI on CKD3 -baseline Cr overall fluctuant since 01/2021 hospitalization, has been any where between 1.8 - ~3. Underlying CKD likely related to HTN, DM -Cr on presentation 2.4, now 2.15/stable with aggressive diuresis (hyaline casts on urine sediment), suspecting an underlying component of CRS as well if truly with volume, hopefully will get a better idea with central line (CVPs are elevated) and RHC -echogenic kidneys on u/s but no obstruction 2/13, duplex study with patent renal arteries bilaterally -UA w/ sediment exam w/ 100 protein on UA (UPC only 0.4g, UACR 239 mg/g) and hyaline casts. There is also microscopic hematuria (external foley). Neg ANA. ANCA panel neg from 2/11 -at this junction, he needs CRRT to help off load volume quickly so we can get him to a RHC (at [facility]) hence transfer, discussed with CCM -Continue to monitor daily Cr, Dose meds for GFR<15 -Monitor Daily I/Os, Daily weight  -Maintain MAP>65 for optimal renal perfusion.  -avoid further nephrotoxins including NSAIDS, Morphine.  Unless absolutely necessary, avoid CT with contrast and/or MRI with gadolinium.      AHRF, secondary to ARDS, possible b/l PNA, questionable pulm edema -no real improvement with diuretics, currently on hold for above -possible hydralazine induced pneumonitis given recurrent admissions-on hold. Anti histone ab neg -abx/steroids per primary service. -s/p TLC to accurately measure CVP, coox -consider RHC once resp status is more stable--crrt plan as above -hold diuretics for today   Metabolic acidosis likely secondary to AKI -started nahco3 1300mg  tid  Hyponatremia -hypervolemic -diuresis/offload vol as above   Hypertension: -avoid hydralazine -off ntg gtt -on imdur, coreg, amlodipine, clonidine   Hyperkalemia -likely related to AKI, improved   Normocytic anemia: -Transfuse for Hgb<7 g/dL -per primary   Diabetes  Mellitus Type 2 with Hyperglycemia -per primary   Recommendations conveyed to primary service.   Gean Quint, MD [facility]   Subjective:   CVP's in the 20's. No new complaints per patient. 1.2L urine output, received 1mg  of bumex.Transferring to [facility] for potential CRRT and RHC. He denies any fevers, chest pain, n/v, dysgeusia, loss of appetite, no shakes/tremors, brain fog, intractable hiccups/pruritis   Objective:   BP (!) 147/62    Pulse (!) 56    Temp 97.6 F (36.4 C) (Oral)    Resp (!) 24    Ht 5\' 8"  (1.727 m)    Wt 127.7 kg    SpO2 98%    BMI 42.81 kg/m   Intake/Output Summary (Last 24 hours) at 05/22/2021 1437 Last data filed at 05/22/2021 1243 Gross per 24 hour  Intake 3123.92 ml  Output 845 ml  Net 2278.92 ml   Weight change: 2.6 kg  Physical Exam: Gen:nad, sitting up in bed Neck: LIJ TLC CVS:rrr, s1s2 Resp: bibasilar crackles, normal WOB [id number], soft Ext:2+ pitting edema b/l le's Neuro: awake, alert  Imaging: CT CHEST WO CONTRAST  Result Date: 05/21/2021 CLINICAL DATA:  Respiratory illness, nondiagnostic xray EXAM: CT CHEST WITHOUT CONTRAST TECHNIQUE: Multidetector CT imaging of the chest was performed following the standard protocol without IV contrast. RADIATION DOSE REDUCTION: This exam was performed according to the departmental dose-optimization program which includes automated exposure control, adjustment of the mA and/or kV according to patient size and/or use of iterative reconstruction technique. COMPARISON:  01/12/2021.  Chest x-ray today. FINDINGS: Cardiovascular: Heart is mildly enlarged. Scattered coronary artery calcifications. Aorta normal caliber. Mediastinum/Nodes: No mediastinal, hilar, or axillary adenopathy. Trachea and esophagus are unremarkable. Thyroid  unremarkable. Lungs/Pleura: Extensive diffuse bilateral airspace disease, most pronounced in the upper lobes, right greater than left. Small right pleural effusion. Upper Abdomen:  Trace perihepatic ascites. Musculoskeletal: Chest wall soft tissues are unremarkable. No acute bony abnormality. IMPRESSION: Cardiomegaly, scattered coronary artery disease. Extensive bilateral airspace disease, right greater than left, most pronounced in the upper lobes. Favor pneumonia. Small right pleural effusion. Trace perihepatic ascites. Electronically Signed   By: Rolm Baptise M.D.   On: 05/21/2021 19:01   DG Chest Port 1 View  Result Date: 05/22/2021 CLINICAL DATA:  Dyspnea EXAM: PORTABLE CHEST 1 VIEW COMPARISON:  None. FINDINGS: LEFT central venous line with tip in the distal brachiocephalic vein. Unchanged. Stable large cardiac silhouette. Perihilar airspace disease is slightly improved. No pneumothorax. Small RIGHT effusion. IMPRESSION: Mild improvement in bilateral airspace disease representing edema versus multifocal pneumonia Electronically Signed   By: Suzy Bouchard M.D.   On: 05/22/2021 11:15   DG CHEST PORT 1 VIEW  Result Date: 05/21/2021 CLINICAL DATA:  Central line placement. EXAM: PORTABLE CHEST 1 VIEW COMPARISON:  Chest x-ray from same day at 1024 hours. FINDINGS: Interval repositioning of the left internal jugular central venous catheter with the tip now in the proximal SVC. Unchanged cardiomegaly. Similar right greater than left lung airspace disease and trace right pleural effusion. No pneumothorax. No acute osseous abnormality. IMPRESSION: 1. Interval repositioning of the left internal jugular central venous catheter with the tip now in the proximal SVC. 2. Similar bilateral airspace disease and trace right pleural effusion. Electronically Signed   By: Titus Dubin M.D.   On: 05/21/2021 12:09   DG CHEST PORT 1 VIEW  Result Date: 05/21/2021 CLINICAL DATA:  Central line placement. EXAM: PORTABLE CHEST 1 VIEW COMPARISON:  Chest x-ray from same day at 0431 hours. FINDINGS: New left internal jugular central venous catheter with the tip oriented superiorly into the right  brachiocephalic vein. Stable cardiomegaly. Right greater than left lung airspace disease appears mildly improved. Suspected trace right pleural effusion, also unchanged. No pneumothorax. No acute osseous abnormality. IMPRESSION: 1. New left internal jugular central venous catheter with the tip oriented superiorly into the right brachiocephalic vein. Recommend repositioning. 2. Somewhat improved bilateral airspace disease which could reflect ARDS or pulmonary edema. Electronically Signed   By: Titus Dubin M.D.   On: 05/21/2021 10:43   DG CHEST PORT 1 VIEW  Result Date: 05/21/2021 CLINICAL DATA:  Respiratory failure EXAM: PORTABLE CHEST 1 VIEW COMPARISON:  Radiograph 05/20/2021 FINDINGS: Unchanged, enlarged cardiomediastinal silhouette. There is severe diffuse airspace disease, not significantly changed in comparison to prior exam. There is no large pleural effusion. There is no visible pneumothorax. There is no acute osseous abnormality. IMPRESSION: Severe diffuse airspace disease, not significantly changed from prior exam. This could represent severe pulmonary edema and/or multifocal pneumonia. Electronically Signed   By: Maurine Simmering M.D.   On: 05/21/2021 08:13   VAS US RENAL ARTERY DUPLEX  Result Date: 05/21/2021 ABDOMINAL VISCERAL Patient Name:  Micheal Levy  Date of Exam:   05/21/2021 Medical Rec #: [id number]        Accession #:    [id number] Date of Birth: 08-04-1980        Patient Gender: M Patient Age:   41 years Exam Location[facility]  [facility] Procedure:      VAS US RENAL ARTERY DUPLEX Referring Phys: [id number] [place] -------------------------------------------------------------------------------- Indications: Chronic kidney disease High Risk Factors: Hypertension, hyperlipidemia, Diabetes, prior CVA. Limitations: Air/bowel gas, obesity and On Bipap machine.  Comparison Study: Last renal study 01/15/2021 - patent renal artery with 1-59% Performing Technologist: Oda Cogan RDMS, RVT   Examination Guidelines: A complete evaluation includes B-mode imaging, spectral Doppler, color Doppler, and power Doppler as needed of all accessible portions of each vessel. Bilateral testing is considered an integral part of a complete examination. Limited examinations for reoccurring indications may be performed as noted.  Duplex Findings: +--------------------+--------+--------+------+--------+  Mesenteric           PSV cm/s EDV cm/s Plaque Comments  +--------------------+--------+--------+------+--------+  Aorta Prox              53                              +--------------------+--------+--------+------+--------+  Celiac Artery Origin   188                              +--------------------+--------+--------+------+--------+  SMA Proximal           186                              +--------------------+--------+--------+------+--------+    +------------------+--------+--------+-------+  Right Renal Artery PSV cm/s EDV cm/s Comment  +------------------+--------+--------+-------+  Origin                64       13             +------------------+--------+--------+-------+  Proximal              60       12             +------------------+--------+--------+-------+  Mid                   83       15             +------------------+--------+--------+-------+  Distal                76       12             +------------------+--------+--------+-------+ +-----------------+--------+--------+-------+  Left Renal Artery PSV cm/s EDV cm/s Comment  +-----------------+--------+--------+-------+  Origin               73       13             +-----------------+--------+--------+-------+  Proximal             68       15             +-----------------+--------+--------+-------+  Mid                  73       13             +-----------------+--------+--------+-------+  Distal               85       9              +-----------------+--------+--------+-------+  Technologist observations: Technically difficult study due to  patient's body habitus. Renal arteries are patent without significant stenosis noted. +------------+--------+--------+----+-----------+--------+--------+----+  Right Kidney PSV cm/s EDV cm/s RI   Left Kidney PSV cm/s EDV cm/s RI    +------------+--------+--------+----+-----------+--------+--------+----+  Upper Pole   0  0             Upper Pole  15       6        0.61  +------------+--------+--------+----+-----------+--------+--------+----+  Mid          17       4        0.78 Mid         16       9        0.59  +------------+--------+--------+----+-----------+--------+--------+----+  Lower Pole   20       6        0.71 Lower Pole  0                       +------------+--------+--------+----+-----------+--------+--------+----+  Hilar        31       6        0.81 Hilar       21       5        0.75  +------------+--------+--------+----+-----------+--------+--------+----+ +------------------+-----+------------------+-----+  Right Kidney             Left Kidney               +------------------+-----+------------------+-----+  RAR                      RAR                       +------------------+-----+------------------+-----+  RAR (manual)       1.2   RAR (manual)       1.3    +------------------+-----+------------------+-----+  Cortex                   Cortex                    +------------------+-----+------------------+-----+  Cortex thickness         Corex thickness           +------------------+-----+------------------+-----+  Kidney length (cm) 12.03 Kidney length (cm) 12.76  +------------------+-----+------------------+-----+  Summary: Renal:  Right: Patent renal artery without significant stenosis. Abnormal        right Resistive Index. Normal size right kidney. Left:  Patent renal artery without significant stenosis. Abnormal        left Resisitve Index. Normal size of left kidney.  *See table(s) above for measurements and observations.  Diagnosing physician: Deitra Mayo MD  Electronically  signed by Deitra Mayo MD on 05/21/2021 at 1:54:52 PM.    Final     Labs: BMET Recent Labs  Lab 05/18/21 0606 05/18/21 1144 05/19/21 0315 05/19/21 1136 05/19/21 2011 05/20/21 0322 05/20/21 1133 05/20/21 2000 05/21/21 0315 05/22/21 1212  NA 138   < > 139 138 137 134* 135 132* 131* 130*  K 4.2   < > 4.4 5.0 4.8 5.2* 4.7 4.5 4.4 4.8  CL 108   < > 111 110 108 107 107 104 104 102  CO2 22   < > 20* 15* 18* 18* 17* 18* 17* 18*  GLUCOSE 196*   < > 133* 153* 183* 180* 285* 247* 203* 266*  BUN 40*   < > 50* 55* 66* 70* 78* 83* 86* 107*  CREATININE 2.39*   < > 3.06* 3.06* 3.63* 3.63* 3.92* 3.94* 3.92* 4.15*  CALCIUM 8.4*   < > 8.4* 8.2* 8.4* 8.2* 8.6* 8.3* 8.3* 7.9*  PHOS  4.2  --  4.5  --   --  5.8*  --   --  5.4* 6.6*   < > = values in this interval not displayed.   CBC Recent Labs  Lab 05/19/21 0315 05/20/21 0322 05/21/21 0315 05/22/21 1212  WBC 17.2* 15.5* 14.1* 9.5  HGB 8.7* 8.9* 10.0* 8.6*  HCT 29.2* 27.9* 32.7* 27.6*  MCV 84.4 85.3 83.6 82.9  PLT 277 265 263 224    Medications:     [START ON 05/23/2021] amLODipine  10 mg Oral Daily   carvedilol  25 mg Oral BID WC   chlorhexidine  15 mL Mouth Rinse BID   Chlorhexidine Gluconate Cloth  6 each Topical Q0600   heparin  5,000 Units Subcutaneous Q8H   hydrALAZINE  100 mg Oral Q8H   insulin aspart  0-15 Units Subcutaneous TID WC   isosorbide mononitrate  120 mg Oral Daily   mouth rinse  15 mL Mouth Rinse q12n4p   methylPREDNISolone (SOLU-MEDROL) injection  40 mg Intravenous Daily   pantoprazole (PROTONIX) IV  40 mg Intravenous QHS   sodium bicarbonate  1,300 mg Oral TID   sodium chloride flush  3 mL Intravenous Q12H      Gean Quint, MD [facility] 05/22/2021, 2:37 PM

## 2021-05-22 NOTE — Progress Notes (Signed)
NAME:  Micheal Levy, MRN:  [id number], DOB:  1981-03-20, LOS: 4 ADMISSION DATE:  05/18/2021, CONSULTATION DATE: 05/18/2021 REFERRING MD:  Dr. Sedonia Small - EDP CHIEF COMPLAINT:  Acute respiratory failure   History of Present Illness:  41 year old man who presented to [facility] ED 2/11 for respiratory distress. PMHx significant for HTN, HLD, 123456, chronic diastolic heart failure (TTE 01/2021 with EF 0000000, grade 3 diastolic dysfunction), CVA, CKD stage IIIa (baseline Cr ~3.0), substance abuse. Recently hospitalized 2/1-2/4 for acute-on-chronic diastolic heart failure and acute-on-chronic kidney injury presenting with dyspnea, PND, worsening pedal edema and elevated BNP. Renal US was WNL.  He was treated with Lasix and discharged home.   Returned 2/11 with worsening respiratory distress. Initially treated as asthma exacerbation with epinephrine by EMS, but found in the ER to be hypertensive with SBP 180-200s, DBP 100s. ABG with respiratory acidosis; mixed picture with sats 80s.  CXR c/w pulmonary edema with significant crackles on exam. Started on NTG gtt, transitioned to nicardipine gtt for goal SBP < 150.  Lasix 40mg  x 1 given.    PCCM was consulted for admission in the setting of respiratory distress, hypertensive urgency.   Pertinent Medical History:  DM  CKD  Substance Abuse  Seizures  Stroke  HTN  Vitamin D Deficiency   Significant Hospital Events:  2/11 Admit  2/13 On BiPAP 18/6, 60% fiO2 2/14 L IJ CVC placed 2/15 Downtitrating Cardene, uptitrating PO antihypertensives. Respiratory status stable on HFNC 15L. Slightly improved but still marginal UOP with aggressive diuresis.  Interim History / Subjective:  Feeling "alright" overall Breathing feels improved, no SOB at time of exam Endorses ongoing swelling/feeling volume up No other significant complaints at present UOP 1234mL/24H s/p Bumex x 1 Net volume status remains +2.5L/24H (+6.6L/admission) Labs/CXR pending  Objective:   Blood  pressure 119/80, pulse (!) 52, temperature 97.6 F (36.4 C), temperature source Axillary, resp. rate 17, height 5\' 8"  (1.727 m), weight 127.7 kg, SpO2 99 %. CVP:  [17 mmHg-86 mmHg] 26 mmHg  FiO2 (%):  [50 %] 50 %   Intake/Output Summary (Last 24 hours) at 05/22/2021 1005 Last data filed at 05/22/2021 [id number] Gross per 24 hour  Intake 2524.39 ml  Output 670 ml  Net 1854.39 ml    Filed Weights   05/20/21 0500 05/21/21 0429 05/22/21 0500  Weight: 124.2 kg 125.1 kg 127.7 kg   Physical Examination: General: Acutely ill-appearing middle-aged man in NAD. Sitting in bed. HEENT: [place]/AT, anicteric sclera, PERRL, moist mucous membranes. HFNC in place. Neuro: Awake, oriented x 4. Responds to verbal stimuli. Following commands consistently. Moves all 4 extremities spontaneously. CV: Mildly bradycardic, no m/g/r. PULM: Breathing even and unlabored on 15L [place] HFNC. Lung fields with bibasilar crackles. GI: Soft, nontender, nondistended. Normoactive bowel sounds. Extremities: Bilateral symmetric 2+ pitting LE edema noted. Skin: Warm/dry, no rashes. Mild discoloration of BLE in the setting of edema.  Resolved Hospital Problem List:    Assessment & Plan:   Acute hypoxemic respiratory failrue due to ARDS, bilateral pneumonia, question pulmonary edema Elevated PCT, lack of response to diuretics, and air bronchograms on CXR argue more for an infectious vs. inflammatory process.  He has had identical presentations to this for the past 5 years recurrent. Nonsmoker, no further cocaine use, no unusual exposures.  Considered hydralazine-induced pneumonitis; however, anti-histone Ab negative (resulted 2/15). Noted nodular/alveolar infiltrates dating back to 01/2021. HIV negative.  - CT Chest 2/14 with extensive bilateral airspace disease (R > L), favoring PNA - Continue HFNC, tolerating  well - BiPAP QHS + PRN - Wean supplemental O2 for sat > 90% - Pulmonary hygiene - Ongoing diuresis as renal function  tolerates - Continue steroids, Solumedrol 40mg  daily - F/u pending AI studies - Follow CXR  Hypertensive Urgency History of CVA, HTN - Continue home amlodipine, Coreg, Imdur - Resume home hydralazine, given negative anti-histone Ab - Hold home valsartan in the setting of AKI on CKD - Discontinue clonidine - Discontinue/wean Cardene gtt to off - Labetalol PRN for BP control, SBP > 160  Baseline Restrictive Cardiomyopathy Echo here unchanged, maybe more vigorous LV function. - F/u kappa light chains, pending AI labs - Trend Co-ox - Consider cardiology involvement - May need Swan if picture continues to be cloudy - Possible RHC once more clinically stable  DM2 - CBGs Q4H - SSI, moderate scale  CKD Prior Renal Artery [place] in 01/2021 with normal kidneys, 1-59% stenosis of right / left renal arteries - VAS US Renal Artery negative for stenosis, abnormal RIs - Trend BMP - Replete electrolytes as indicated - Strict I&Os in the setting of diuresis - Avoid nephrotoxic agents as able - Ensure adequate renal perfusion  Best Practice (right click and "Reselect all SmartList Selections" daily)   Diet/type: Regular consistency (see orders) DVT prophylaxis: prophylactic heparin  GI prophylaxis: PPI Central venous access:  Yes, and it is still needed Foley:  N/A Code Status:  full code Last date of multidisciplinary goals of care discussion [Full Code Status]  Critical Care Time:  38 minutes   Lestine Mount, PA-C [facility] 05/22/21 10:06 AM  Please see [email or web address] for pager details.  From 7A-7P if no response, please call [phone] After hours, please call ELink [phone]

## 2021-05-22 NOTE — Progress Notes (Signed)
Inpatient Diabetes Program Recommendations  AACE/ADA: New Consensus Statement on Inpatient Glycemic Control (2015)  Target Ranges:  Prepandial:   less than 140 mg/dL      Peak postprandial:   less than 180 mg/dL (1-2 hours)      Critically ill patients:  140 - 180 mg/dL   Lab Results  Component Value Date   GLUCAP 235 (H) 05/22/2021   HGBA1C 6.6 (H) 05/21/2021    Review of Glycemic Control  Latest Reference Range & Units 05/21/21 07:43 05/21/21 11:48 05/21/21 16:25 05/21/21 21:23 05/22/21 08:15  Glucose-Capillary 70 - 99 mg/dL 197 (H) 248 (H) 275 (H) 247 (H) 235 (H)   Diabetes history: Type 2 DM Outpatient Diabetes medications: Lantus 5 units QHS, Metformin 1000 mg BID Current orders for Inpatient glycemic control: Novolog 0-15 units TID Solumedrol 40 mg QD Inpatient Diabetes Program Recommendations:    If steroids to continue: Consider adding Novolog 5 units TID (assuming patient is consuming >50% of meals).   Thanks, Tama Headings RN, MSN, BC-ADM Inpatient Diabetes Coordinator Team Pager [phone] (8a-5p)

## 2021-05-23 ENCOUNTER — Inpatient Hospital Stay (HOSPITAL_COMMUNITY): Payer: Medicaid Other

## 2021-05-23 DIAGNOSIS — N179 Acute kidney failure, unspecified: Secondary | ICD-10-CM

## 2021-05-23 DIAGNOSIS — J9601 Acute respiratory failure with hypoxia: Secondary | ICD-10-CM | POA: Diagnosis not present

## 2021-05-23 LAB — PHOSPHORUS: Phosphorus: 6.4 mg/dL — ABNORMAL HIGH (ref 2.5–4.6)

## 2021-05-23 LAB — BASIC METABOLIC PANEL
Anion gap: 8 (ref 5–15)
BUN: 119 mg/dL — ABNORMAL HIGH (ref 6–20)
CO2: 20 mmol/L — ABNORMAL LOW (ref 22–32)
Calcium: 8.2 mg/dL — ABNORMAL LOW (ref 8.9–10.3)
Chloride: 105 mmol/L (ref 98–111)
Creatinine, Ser: 3.93 mg/dL — ABNORMAL HIGH (ref 0.61–1.24)
GFR, Estimated: 19 mL/min — ABNORMAL LOW (ref >60)
Glucose, Bld: 226 mg/dL — ABNORMAL HIGH (ref 70–99)
Potassium: 4.5 mmol/L (ref 3.5–5.1)
Sodium: 133 mmol/L — ABNORMAL LOW (ref 135–145)

## 2021-05-23 LAB — RENAL FUNCTION PANEL
Albumin: 2.4 g/dL — ABNORMAL LOW (ref 3.5–5.0)
Anion gap: 8 (ref 5–15)
BUN: 105 mg/dL — ABNORMAL HIGH (ref 6–20)
CO2: 19 mmol/L — ABNORMAL LOW (ref 22–32)
Calcium: 7.7 mg/dL — ABNORMAL LOW (ref 8.9–10.3)
Chloride: 105 mmol/L (ref 98–111)
Creatinine, Ser: 3.56 mg/dL — ABNORMAL HIGH (ref 0.61–1.24)
GFR, Estimated: 21 mL/min — ABNORMAL LOW (ref >60)
Glucose, Bld: 270 mg/dL — ABNORMAL HIGH (ref 70–99)
Phosphorus: 5.9 mg/dL — ABNORMAL HIGH (ref 2.5–4.6)
Potassium: 4.1 mmol/L (ref 3.5–5.1)
Sodium: 132 mmol/L — ABNORMAL LOW (ref 135–145)

## 2021-05-23 LAB — GLUCOSE, CAPILLARY
Glucose-Capillary: 189 mg/dL — ABNORMAL HIGH (ref 70–99)
Glucose-Capillary: 239 mg/dL — ABNORMAL HIGH (ref 70–99)
Glucose-Capillary: 226 mg/dL — ABNORMAL HIGH (ref 70–99)
Glucose-Capillary: 207 mg/dL — ABNORMAL HIGH (ref 70–99)

## 2021-05-23 LAB — CBC
HCT: 28.5 % — ABNORMAL LOW (ref 39.0–52.0)
Hemoglobin: 8.9 g/dL — ABNORMAL LOW (ref 13.0–17.0)
MCH: 25.4 pg — ABNORMAL LOW (ref 26.0–34.0)
MCHC: 31.2 g/dL (ref 30.0–36.0)
MCV: 81.4 fL (ref 80.0–100.0)
Platelets: 224 10*3/uL (ref 150–400)
RBC: 3.5 MIL/uL — ABNORMAL LOW (ref 4.22–5.81)
RDW: 17 % — ABNORMAL HIGH (ref 11.5–15.5)
WBC: 10 10*3/uL (ref 4.0–10.5)
nRBC: 1.2 % — ABNORMAL HIGH (ref 0.0–0.2)

## 2021-05-23 LAB — MAGNESIUM: Magnesium: 2.4 mg/dL (ref 1.7–2.4)

## 2021-05-23 MED ORDER — PRISMASOL BGK 4/2.5 32-4-2.5 MEQ/L EC SOLN: Status: DC

## 2021-05-23 MED ORDER — PANTOPRAZOLE SODIUM 40 MG PO TBEC
40.0000 mg | DELAYED_RELEASE_TABLET | Freq: Every day | ORAL | Status: DC
  Administered 2021-05-23 – 2021-06-07 (×16): 40 mg via ORAL
  Filled 2021-05-23 (×16): qty 1

## 2021-05-23 MED ORDER — INSULIN ASPART 100 UNIT/ML IJ SOLN
4.0000 [IU] | Freq: Three times a day (TID) | INTRAMUSCULAR | Status: DC
  Administered 2021-05-23 – 2021-06-08 (×44): 4 [IU] via SUBCUTANEOUS

## 2021-05-23 MED ORDER — HEPARIN SODIUM (PORCINE) 1000 UNIT/ML DIALYSIS
1000.0000 [IU] | INTRAMUSCULAR | Status: DC | PRN
  Administered 2021-05-23 – 2021-05-28 (×2): 2400 [IU] via INTRAVENOUS_CENTRAL
  Filled 2021-05-23: qty 6
  Filled 2021-05-23: qty 4
  Filled 2021-05-23: qty 6

## 2021-05-23 MED ORDER — PRISMASOL BGK 4/2.5 32-4-2.5 MEQ/L REPLACEMENT SOLN: Status: DC

## 2021-05-23 MED ORDER — SODIUM CHLORIDE 0.9 % IV SOLN
500.0000 [IU]/h | INTRAVENOUS | Status: DC
  Administered 2021-05-23 – 2021-05-24 (×2): 500 [IU]/h via INTRAVENOUS_CENTRAL
  Administered 2021-05-24 – 2021-05-28 (×7): 750 [IU]/h via INTRAVENOUS_CENTRAL
  Filled 2021-05-23: qty 2
  Filled 2021-05-23: qty 10000
  Filled 2021-05-23: qty 2
  Filled 2021-05-23 (×3): qty 10000
  Filled 2021-05-23: qty 2
  Filled 2021-05-23 (×3): qty 10000

## 2021-05-23 MED ORDER — HEPARIN (PORCINE) 2000 UNITS/L FOR CRRT: INTRAVENOUS_CENTRAL | Status: DC | PRN

## 2021-05-23 NOTE — Progress Notes (Addendum)
Upon 2000 RN assessment, pt discussed concerns to RN about miscommunication of creatine lab and need for dialysis. RN discussed with pt and pt sister about need to go to [facility] for potential CRRT. Answered all questions.   Pt refused 2200 and 0600 hydralazine due to concerns that provider already d/c'd medication and no need to take any longer. RN gave PRN metoprolol x2  to help control BP.

## 2021-05-23 NOTE — Progress Notes (Signed)
NAME:  Micheal Levy, MRN:  [id number], DOB:  Aug 24, 1980, LOS: 5 ADMISSION DATE:  05/18/2021, CONSULTATION DATE: 05/18/2021 REFERRING MD:  Dr. Sedonia Small - EDP CHIEF COMPLAINT:  Acute respiratory failure   History of Present Illness:  41 year old man who presented to [facility] ED 2/11 for respiratory distress. PMHx significant for HTN, HLD, [id number], chronic diastolic heart failure (TTE 01/2021 with EF 0000000, grade 3 diastolic dysfunction), CVA, CKD stage IIIa (baseline Cr ~3.0), substance abuse. Recently hospitalized 2/1-2/4 for acute-on-chronic diastolic heart failure and acute-on-chronic kidney injury presenting with dyspnea, PND, worsening pedal edema and elevated BNP. Renal US was WNL.  He was treated with Lasix and discharged home.   Returned 2/11 with worsening respiratory distress. Initially treated as asthma exacerbation with epinephrine by EMS, but found in the ER to be hypertensive with SBP 180-200s, DBP 100s. ABG with respiratory acidosis; mixed picture with sats 80s.  CXR c/w pulmonary edema with significant crackles on exam. Started on NTG gtt, transitioned to nicardipine gtt for goal SBP < 150.  Lasix 40mg  x 1 given.    PCCM was consulted for admission in the setting of respiratory distress, hypertensive urgency.   Pertinent Medical History:  DM  CKD  Substance Abuse  Seizures  Stroke  HTN  Vitamin D Deficiency   Significant Hospital Events:  2/11 Admit  2/13 On BiPAP 18/6, 60% fiO2 2/14 L IJ CVC placed 2/15 Downtitrating Cardene, uptitrating PO antihypertensives. Respiratory status stable on HFNC 15L. Slightly improved but still marginal UOP with aggressive diuresis. 2/16 Continued improvement in O2 needs. Remains hypertensive off of Cardene (stopped given significant fluid burden). HD/CRRT initiation to optimize for RHC. Plan for transfer to [facility].  Interim History / Subjective:  Feeling better today overall Less fatigued Making some urine Net -914mL/24H Remains hypertensive, SBPs  170s-180s Overwhelmed with transfer, dialysis initiation Discussed at length with patient/sister, who are more comfortable with plan today Trialysis placement today and pending [facility] transfer  Objective:   Blood pressure (!) 162/100, pulse 67, temperature (!) 97 F (36.1 C), temperature source Oral, resp. rate 19, height 5\' 8"  (1.727 m), weight 128.7 kg, SpO2 100 %. CVP:  [18 mmHg-40 mmHg] 40 mmHg  FiO2 (%):  [60 %] 60 %   Intake/Output Summary (Last 24 hours) at 05/23/2021 0745 Last data filed at 05/23/2021 0600 Gross per 24 hour  Intake 1055.53 ml  Output 2025 ml  Net -969.47 ml    Filed Weights   05/21/21 0429 05/22/21 0500 05/23/21 0500  Weight: 125.1 kg 127.7 kg 128.7 kg   Physical Examination: General: Acutely ill-appearing middle-aged man in NAD. HEENT: [place]/AT, anicteric sclera, PERRL, moist mucous membranes. [place] in place. Neuro: Awake, oriented x 4. Responds to verbal stimuli. Following commands consistently. Moves all 4 extremities spontaneously. CV: RRR, no m/g/r. PULM: Breathing even and unlabored on 10L [place]. Lung fields diminished with bibasilar crackles. GI: Soft, nontender, nondistended. Normoactive bowel sounds. Extremities: Bilateral symmetric 2+ pitting LE edema noted. Skin: Warm/dry, no rash.  Resolved Hospital Problem List:    Assessment & Plan:   Acute hypoxemic respiratory failrue due to ARDS, bilateral pneumonia, question pulmonary edema Elevated PCT, lack of response to diuretics, and air bronchograms on CXR argue more for an infectious vs. inflammatory process.  He has had identical presentations to this for the past 5 years recurrent. Nonsmoker, no further cocaine use, no unusual exposures.  Considered hydralazine-induced pneumonitis; however, anti-histone Ab negative (resulted 2/15). Noted nodular/alveolar infiltrates dating back to 01/2021. HIV negative. CT Chest  2/14 with extensive bilateral airspace disease (R > L), favoring PNA. - Continue [place]  HFNC, tolerating well - BiPAP QHS + PRN - Wean supplemental O2 for sat > 90% - Pulmonary hygiene - Ongoing diuresis, limited by renal function - transitioning to CRRT - Continue steroids - F/u pending AI studies - Follow CXR  Hypertensive Urgency History of CVA, HTN - Continue home amlodipine, Coreg, Imdur - Okay to resume home hydralazine given negative antihistone antibody - Hold home valsartan in setting of AKI on CKD - Labetalol as needed for BP control, SBP goal < 160 - Suspect hypertension will markedly improve once volume status is optimized, this will likely require renal replacement therapy at this juncture - Plan for Trialysis catheter placement, CRRT initiation and transfer to [facility] today for optimization prior to [place]  Baseline Restrictive Cardiomyopathy Echo here unchanged, maybe more vigorous LV function. - F/u kappa light chains, pending AI labs - Plan for RHC once volume status optimized - Transfer to [facility] pending 2/16  DM2 - CBGs Q4H - Novolog 4U TID - SSI  AKI on CKD Prior Renal Artery [place] in 01/2021 with normal kidneys, 1-59% stenosis of right / left renal arteries. VAS US Renal Artery negative for stenosis, abnormal RIs. - Trend BMP - Replete electrolytes as indicated - Strict I&Os in the setting of diuresis - Avoid nephrotoxic agents as able - Ensure adequate renal perfusion - Plan for CRRT initiation 2/16 for volume status optimization  Best Practice: (right click and "Reselect all SmartList Selections" daily)   Diet/type: Regular consistency (see orders) DVT prophylaxis: prophylactic heparin  GI prophylaxis: PPI Central venous access:  Yes, and it is still needed Foley:  N/A Code Status:  full code Last date of multidisciplinary goals of care discussion [Full Code Status]  Critical Care Time: N/A minutes   Lestine Mount, PA-C [facility] 05/23/21 7:45 AM  Please see [email or web address] for pager details.  From 7A-7P if no  response, please call [phone] After hours, please call ELink [phone]

## 2021-05-23 NOTE — Procedures (Signed)
Central Venous Catheter Insertion Procedure Note  Micheal Levy  [id number]  1980-12-08  Date:05/23/21  Time:2:22 PM   Provider Performing:Taiden Raybourn   Procedure: Insertion of Non-tunneled Central Venous Catheter(36556)with US guidance (75449)    Indication(s) Hemodialysis  Consent Risks of the procedure as well as the alternatives and risks of each were explained to the patient and/or caregiver.  Consent for the procedure was obtained and is signed in the bedside chart  Anesthesia Topical only with 1% lidocaine   Timeout Verified patient identification, verified procedure, site/side was marked, verified correct patient position, special equipment/implants available, medications/allergies/relevant history reviewed, required imaging and test results available.  Sterile Technique Maximal sterile technique including full sterile barrier drape, hand hygiene, sterile gown, sterile gloves, mask, hair covering, sterile ultrasound probe cover (if used).  Procedure Description Area of catheter insertion was cleaned with chlorhexidine and draped in sterile fashion.   With real-time ultrasound guidance a HD catheter was placed into the right internal jugular vein.  Nonpulsatile blood flow and easy flushing noted in all ports.  The catheter was sutured in place and sterile dressing applied.  Complications/Tolerance None; patient tolerated the procedure well. Chest X-ray is ordered to verify placement for internal jugular or subclavian cannulation.  Chest x-ray is not ordered for femoral cannulation.  EBL Minimal  Specimen(s) None

## 2021-05-23 NOTE — Progress Notes (Signed)
[facility] Progress Note    Assessment/ Plan:   AKI on CKD3 -baseline Cr overall fluctuant since 01/2021 hospitalization, has been any where between 1.8 - ~3. Underlying CKD likely related to HTN, DM -Cr on presentation 2.4, now 3.95  (hyaline casts on urine sediment), suspecting an underlying component of CRS as well if truly with volume, CVP's have been high -given inadequate response to diuretics & the need to get to [place], initiating CRRT 2/16 to help expedite offloading volume (Lamarius and I have discussed this in the last few days and has been accepting of this). Hopefully, renal replacement therapy will be a temporary measure. Appreciate PCCM's assistance with temp line placement. Pending transfer to [facility] for eventual RHC -echogenic kidneys on u/s but no obstruction 2/13, duplex study with patent renal arteries bilaterally -UA w/ sediment exam w/ 100 protein on UA (UPC only 0.4g, UACR 239 mg/g) and hyaline casts. There is also microscopic hematuria (external foley). Neg ANA. ANCA panel neg from 2/11 -Continue to monitor daily Cr, Dose meds for GFR<15 -Monitor Daily I/Os, Daily weight  -Maintain MAP>65 for optimal renal perfusion.  -avoid further nephrotoxins including NSAIDS, Morphine.  Unless absolutely necessary, avoid CT with contrast and/or MRI with gadolinium.      AHRF, secondary to ARDS, possible b/l PNA, questionable pulm edema -no real improvement with diuretics, currently on hold for above -possible hydralazine induced pneumonitis given recurrent admissions-on hold. Anti histone ab neg -abx/steroids per primary service. -s/p TLC to accurately measure CVP, coox -consider RHC once resp status is more stable--crrt plan as above   Metabolic acidosis likely secondary to AKI -started nahco3 1300mg  tid  Hyponatremia -hypervolemic -diuresis/offload vol as above   Hypertension: -off ntg gtt -on imdur, coreg, amlodipine, clonidine -back on hydralazine-antihistone  ab neg   Hyperkalemia -likely related to AKI, improved/resolved   Normocytic anemia: -Transfuse for Hgb<7 g/dL -per primary   Diabetes Mellitus Type 2 with Hyperglycemia -per primary   Gean Quint, MD [facility]   Subjective:   Pending transfer to [facility]. Initiating CRRT here while we're waiting for a bed. In the process of obtaining a temp HD cath.   Objective:   BP (!) 192/101    Pulse 75    Temp 97.9 F (36.6 C) (Oral)    Resp (!) 25    Ht 5\' 8"  (1.727 m)    Wt 128.7 kg    SpO2 97%    BMI 43.14 kg/m   Intake/Output Summary (Last 24 hours) at 05/23/2021 1227 Last data filed at 05/23/2021 1100 Gross per 24 hour  Intake 461.64 ml  Output 2575 ml  Net -2113.36 ml   Weight change: 1 kg  Physical Exam: Gen:nad, sitting up in bed Neck: LIJ TLC CVS:rrr, s1s2 Resp: diminished air entry bibasilar, normal WOB [id number], soft Ext:2+ pitting edema b/l le's Neuro: awake, alert  Imaging: CT CHEST WO CONTRAST  Result Date: 05/21/2021 CLINICAL DATA:  Respiratory illness, nondiagnostic xray EXAM: CT CHEST WITHOUT CONTRAST TECHNIQUE: Multidetector CT imaging of the chest was performed following the standard protocol without IV contrast. RADIATION DOSE REDUCTION: This exam was performed according to the departmental dose-optimization program which includes automated exposure control, adjustment of the mA and/or kV according to patient size and/or use of iterative reconstruction technique. COMPARISON:  01/12/2021.  Chest x-ray today. FINDINGS: Cardiovascular: Heart is mildly enlarged. Scattered coronary artery calcifications. Aorta normal caliber. Mediastinum/Nodes: No mediastinal, hilar, or axillary adenopathy. Trachea and esophagus are unremarkable. Thyroid unremarkable. Lungs/Pleura: Extensive diffuse  bilateral airspace disease, most pronounced in the upper lobes, right greater than left. Small right pleural effusion. Upper Abdomen: Trace perihepatic ascites. Musculoskeletal:  Chest wall soft tissues are unremarkable. No acute bony abnormality. IMPRESSION: Cardiomegaly, scattered coronary artery disease. Extensive bilateral airspace disease, right greater than left, most pronounced in the upper lobes. Favor pneumonia. Small right pleural effusion. Trace perihepatic ascites. Electronically Signed   By: Rolm Baptise M.D.   On: 05/21/2021 19:01   DG Chest Port 1 View  Result Date: 05/22/2021 CLINICAL DATA:  Dyspnea EXAM: PORTABLE CHEST 1 VIEW COMPARISON:  None. FINDINGS: LEFT central venous line with tip in the distal brachiocephalic vein. Unchanged. Stable large cardiac silhouette. Perihilar airspace disease is slightly improved. No pneumothorax. Small RIGHT effusion. IMPRESSION: Mild improvement in bilateral airspace disease representing edema versus multifocal pneumonia Electronically Signed   By: Suzy Bouchard M.D.   On: 05/22/2021 11:15    Labs: BMET Recent Labs  Lab 05/18/21 0606 05/18/21 1144 05/19/21 0315 05/19/21 1136 05/19/21 2011 05/20/21 0322 05/20/21 1133 05/20/21 2000 05/21/21 0315 05/22/21 1212 05/23/21 0249  NA 138   < > 139   < > 137 134* 135 132* 131* 130* 133*  K 4.2   < > 4.4   < > 4.8 5.2* 4.7 4.5 4.4 4.8 4.5  CL 108   < > 111   < > 108 107 107 104 104 102 105  CO2 22   < > 20*   < > 18* 18* 17* 18* 17* 18* 20*  GLUCOSE 196*   < > 133*   < > 183* 180* 285* 247* 203* 266* 226*  BUN 40*   < > 50*   < > 66* 70* 78* 83* 86* 107* 119*  CREATININE 2.39*   < > 3.06*   < > 3.63* 3.63* 3.92* 3.94* 3.92* 4.15* 3.93*  CALCIUM 8.4*   < > 8.4*   < > 8.4* 8.2* 8.6* 8.3* 8.3* 7.9* 8.2*  PHOS 4.2  --  4.5  --   --  5.8*  --   --  5.4* 6.6* 6.4*   < > = values in this interval not displayed.   CBC Recent Labs  Lab 05/20/21 0322 05/21/21 0315 05/22/21 1212 05/23/21 0249  WBC 15.5* 14.1* 9.5 10.0  HGB 8.9* 10.0* 8.6* 8.9*  HCT 27.9* 32.7* 27.6* 28.5*  MCV 85.3 83.6 82.9 81.4  PLT 265 263 224 224    Medications:     amLODipine  10 mg Oral  Daily   carvedilol  25 mg Oral BID WC   chlorhexidine  15 mL Mouth Rinse BID   Chlorhexidine Gluconate Cloth  6 each Topical Q0600   heparin  5,000 Units Subcutaneous Q8H   hydrALAZINE  100 mg Oral Q8H   insulin aspart  0-20 Units Subcutaneous TID WC   insulin aspart  0-5 Units Subcutaneous QHS   insulin aspart  4 Units Subcutaneous TID WC   isosorbide mononitrate  120 mg Oral Daily   mouth rinse  15 mL Mouth Rinse q12n4p   methylPREDNISolone (SOLU-MEDROL) injection  40 mg Intravenous Daily   pantoprazole  40 mg Oral QHS   sodium bicarbonate  1,300 mg Oral TID   sodium chloride flush  3 mL Intravenous Q12H      Gean Quint, MD [facility] 05/23/2021, 12:27 PM

## 2021-05-24 DIAGNOSIS — I509 Heart failure, unspecified: Secondary | ICD-10-CM | POA: Diagnosis not present

## 2021-05-24 DIAGNOSIS — J9601 Acute respiratory failure with hypoxia: Secondary | ICD-10-CM | POA: Diagnosis not present

## 2021-05-24 DIAGNOSIS — J9602 Acute respiratory failure with hypercapnia: Secondary | ICD-10-CM | POA: Diagnosis not present

## 2021-05-24 DIAGNOSIS — N179 Acute kidney failure, unspecified: Secondary | ICD-10-CM | POA: Diagnosis not present

## 2021-05-24 LAB — APTT: aPTT: 28 seconds (ref 24–36)

## 2021-05-24 LAB — RENAL FUNCTION PANEL
Albumin: 2.5 g/dL — ABNORMAL LOW (ref 3.5–5.0)
Albumin: 2.6 g/dL — ABNORMAL LOW (ref 3.5–5.0)
Anion gap: 6 (ref 5–15)
Anion gap: 5 (ref 5–15)
BUN: 80 mg/dL — ABNORMAL HIGH (ref 6–20)
BUN: 67 mg/dL — ABNORMAL HIGH (ref 6–20)
CO2: 25 mmol/L (ref 22–32)
CO2: 25 mmol/L (ref 22–32)
Calcium: 8.4 mg/dL — ABNORMAL LOW (ref 8.9–10.3)
Calcium: 8 mg/dL — ABNORMAL LOW (ref 8.9–10.3)
Chloride: 104 mmol/L (ref 98–111)
Chloride: 105 mmol/L (ref 98–111)
Creatinine, Ser: 2.74 mg/dL — ABNORMAL HIGH (ref 0.61–1.24)
Creatinine, Ser: 2.25 mg/dL — ABNORMAL HIGH (ref 0.61–1.24)
GFR, Estimated: 29 mL/min — ABNORMAL LOW (ref >60)
GFR, Estimated: 36 mL/min — ABNORMAL LOW (ref >60)
Glucose, Bld: 165 mg/dL — ABNORMAL HIGH (ref 70–99)
Glucose, Bld: 234 mg/dL — ABNORMAL HIGH (ref 70–99)
Phosphorus: 4.6 mg/dL (ref 2.5–4.6)
Phosphorus: 4.2 mg/dL (ref 2.5–4.6)
Potassium: 4 mmol/L (ref 3.5–5.1)
Potassium: 4.6 mmol/L (ref 3.5–5.1)
Sodium: 135 mmol/L (ref 135–145)
Sodium: 135 mmol/L (ref 135–145)

## 2021-05-24 LAB — GLUCOSE, CAPILLARY
Glucose-Capillary: 169 mg/dL — ABNORMAL HIGH (ref 70–99)
Glucose-Capillary: 220 mg/dL — ABNORMAL HIGH (ref 70–99)
Glucose-Capillary: 220 mg/dL — ABNORMAL HIGH (ref 70–99)
Glucose-Capillary: 229 mg/dL — ABNORMAL HIGH (ref 70–99)

## 2021-05-24 LAB — MAGNESIUM: Magnesium: 2.6 mg/dL — ABNORMAL HIGH (ref 1.7–2.4)

## 2021-05-24 MED ORDER — CLONIDINE HCL 0.1 MG PO TABS
0.2000 mg | ORAL_TABLET | Freq: Two times a day (BID) | ORAL | Status: DC
Start: 2021-05-24 — End: 2021-05-25
  Administered 2021-05-24 – 2021-05-25 (×3): 0.2 mg via ORAL
  Filled 2021-05-24 (×3): qty 2

## 2021-05-24 MED ORDER — CLONIDINE HCL 0.1 MG PO TABS
0.2000 mg | ORAL_TABLET | Freq: Once | ORAL | Status: AC
Start: 2021-05-24 — End: 2021-05-24
  Administered 2021-05-24: 0.2 mg via ORAL
  Filled 2021-05-24: qty 2

## 2021-05-24 NOTE — Progress Notes (Signed)
[facility] Progress Note    Assessment/ Plan:   AKI on CKD3 -baseline Cr overall fluctuant since 01/2021 hospitalization, has been any where between 1.8 - ~3. Underlying CKD likely related to HTN, DM -Cr on presentation 2.4 w/ hyaline casts on urine sediment, suspecting an underlying component of CRS as well if truly with volume, CVP's have been high via central line -given inadequate response to diuretics & the need to get to [place], initiated CRRT 2/16 to help expedite offloading volume (appreciate CCM's assistance with RIJ trialysis catheter on 2/16). Hopefully, renal replacement therapy will be a temporary measure. Pending transfer to [facility] for eventual RHC -will aim for net neg 100-200cc/hr which is he is tolerating, upgrade to M150 filter -echogenic kidneys on u/s but no obstruction 2/13, duplex study with patent renal arteries bilaterally -UA w/ sediment exam w/ 100 protein on UA (UPC only 0.4g, UACR 239 mg/g) and hyaline casts. There is also microscopic hematuria (external foley). Neg ANA. ANCA panel neg from 2/11 -Avoid nephrotoxic medications including NSAIDs and iodinated intravenous contrast exposure unless the latter is absolutely indicated.  Preferred narcotic agents for pain control are hydromorphone, fentanyl, and methadone. Morphine should not be used. Avoid Baclofen and avoid oral sodium phosphate and magnesium citrate based laxatives / bowel preps. Continue strict Input and Output monitoring. Will monitor the patient closely with you and intervene or adjust therapy as indicated by changes in clinical status/labs    AHRF, secondary to ARDS, possible b/l PNA, questionable pulm edema -no real improvement with diuretics, currently on hold for above -possible hydralazine induced pneumonitis given recurrent admissions-on hold. Anti histone ab neg -abx/steroids per primary service. -s/p TLC to accurately measure CVP, coox -consider RHC once resp status is more stable--crrt  plan as above--UF as tolerated   Metabolic acidosis likely secondary to AKI -stopping nahco3 tabs since he is on CRRT  Hyponatremia -hypervolemic -diuresis/offload vol as above, Na improved to 135 today   Hypertension: -on imdur, coreg, amlodipine, clonidine -UF as tolerated   Hyperkalemia -likely related to AKI, improved/resolved   Normocytic anemia: -Transfuse for Hgb<7 g/dL -per primary   Diabetes Mellitus Type 2 with Hyperglycemia -per primary  Gean Quint, MD [facility]   Subjective:   Patient seen and examined in ICU. CRRT currently being changed to M150 since he clotted (off crrt currently). Has been running well on CRRT, tolerating UF. BP's better as of right now. Patient reports that he feels well, breathing slowly improving. Discussed w/ ICU RN.   Objective:   BP (!) 148/97    Pulse 63    Temp 97.6 F (36.4 C) (Oral)    Resp 18    Ht 5\' 8"  (1.727 m)    Wt 128.7 kg    SpO2 100%    BMI 43.14 kg/m   Intake/Output Summary (Last 24 hours) at 05/24/2021 1419 Last data filed at 05/24/2021 1400 Gross per 24 hour  Intake 2217.26 ml  Output 7473.8 ml  Net -5256.54 ml   Weight change: 0 kg  Physical Exam: Gen:nad, sitting up in bed CVS:rrr, s1s2 Resp: diminished air entry bibasilar, normal WOB [id number], soft Ext:2+ pitting edema b/l le's Neuro: awake, alert Dialysis access: RIJ trialysis catheter  Imaging: DG Chest Port 1 View  Result Date: 05/23/2021 CLINICAL DATA:  Central line placement EXAM: PORTABLE CHEST 1 VIEW COMPARISON:  Earlier today at 12:24 p.m. FINDINGS: 2:09 p.m. Left internal jugular line tip at high SVC. Placement of a right internal jugular line with  tip at mid SVC. Midline trachea. Moderate cardiomegaly. Suspect tiny right pleural effusion. No pneumothorax. Right greater than left interstitial and airspace disease is similar. IMPRESSION: New right-sided central line, terminating at the mid SVC. Similar cardiomegaly with right  greater than left interstitial and airspace disease. Favor infection over pulmonary edema, given appearance on 05/21/2021 CT. Electronically Signed   By: Abigail Miyamoto M.D.   On: 05/23/2021 14:43   DG Chest Port 1 View  Result Date: 05/23/2021 CLINICAL DATA:  Shortness of breath EXAM: PORTABLE CHEST 1 VIEW COMPARISON:  Chest radiograph 1 day prior FINDINGS: Left IJ vascular catheter is in stable position terminating in the region of the distal brachiocephalic vein The cardiac silhouette is enlarged, unchanged. The mediastinal contours are stable. Extensive opacities in the right lung predominantly in the right upper lobe have worsened in the interim. The right costophrenic angle is partially cut off. Aeration of the left lung is unchanged. There is no significant left effusion. There is no pneumothorax. The bones are stable. IMPRESSION: Extensive opacities in the right lung predominantly in the right upper lobe have worsened in the interim, concerning for worsened infection. Electronically Signed   By: Valetta Mole M.D.   On: 05/23/2021 12:42    Labs: BMET Recent Labs  Lab 05/19/21 0315 05/19/21 1136 05/20/21 0322 05/20/21 1133 05/20/21 2000 05/21/21 0315 05/22/21 1212 05/23/21 0249 05/23/21 1518 05/24/21 0500  NA 139   < > 134* 135 132* 131* 130* 133* 132* 135  K 4.4   < > 5.2* 4.7 4.5 4.4 4.8 4.5 4.1 4.0  CL 111   < > 107 107 104 104 102 105 105 104  CO2 20*   < > 18* 17* 18* 17* 18* 20* 19* 25  GLUCOSE 133*   < > 180* 285* 247* 203* 266* 226* 270* 165*  BUN 50*   < > 70* 78* 83* 86* 107* 119* 105* 80*  CREATININE 3.06*   < > 3.63* 3.92* 3.94* 3.92* 4.15* 3.93* 3.56* 2.74*  CALCIUM 8.4*   < > 8.2* 8.6* 8.3* 8.3* 7.9* 8.2* 7.7* 8.4*  PHOS 4.5  --  5.8*  --   --  5.4* 6.6* 6.4* 5.9* 4.6   < > = values in this interval not displayed.   CBC Recent Labs  Lab 05/20/21 0322 05/21/21 0315 05/22/21 1212 05/23/21 0249  WBC 15.5* 14.1* 9.5 10.0  HGB 8.9* 10.0* 8.6* 8.9*  HCT 27.9* 32.7*  27.6* 28.5*  MCV 85.3 83.6 82.9 81.4  PLT 265 263 224 224    Medications:     amLODipine  10 mg Oral Daily   carvedilol  25 mg Oral BID WC   chlorhexidine  15 mL Mouth Rinse BID   Chlorhexidine Gluconate Cloth  6 each Topical Q0600   cloNIDine  0.2 mg Oral BID   heparin  5,000 Units Subcutaneous Q8H   insulin aspart  0-20 Units Subcutaneous TID WC   insulin aspart  0-5 Units Subcutaneous QHS   insulin aspart  4 Units Subcutaneous TID WC   isosorbide mononitrate  120 mg Oral Daily   mouth rinse  15 mL Mouth Rinse q12n4p   methylPREDNISolone (SOLU-MEDROL) injection  40 mg Intravenous Daily   pantoprazole  40 mg Oral QHS   sodium bicarbonate  1,300 mg Oral TID   sodium chloride flush  3 mL Intravenous Q12H      Gean Quint, MD [facility] 05/24/2021, 2:19 PM

## 2021-05-24 NOTE — Progress Notes (Addendum)
Pt now placed on bipap for the night.

## 2021-05-24 NOTE — Progress Notes (Signed)
eLink Physician-Brief Progress Note Patient Name: Micheal Levy DOB: [birth year] MRN: [id number]   Date of Service  05/24/2021  HPI/Events of Note  Nurse gave prn labetalol 1 hour ago for BP 193/95 and it keeps going up steadily. Now 208/113. Pt is on CRRT. He can not tolerate hydralazine( pneumonitis). He has been on cardene and nitro gtts in the past.   on imdur, coreg, amlodipine, clonidine  eICU Interventions  Clonidine was discontinued. earlier, never got it. Not on PTA meds  Clonidine 0.2 mg oral once for now. On BiPAP. Aspiration precautions.   Discussed with RN. Nephrology to follow through in AM.       Intervention Category Intermediate Interventions: Hypertension - evaluation and management  Ranee Gosselin 05/24/2021, 12:48 AM

## 2021-05-24 NOTE — Progress Notes (Signed)
NAME:  Micheal Levy, MRN:  [id number], DOB:  02-26-1981, LOS: 6 ADMISSION DATE:  05/18/2021, CONSULTATION DATE: 05/18/2021 REFERRING MD:  Dr. Sedonia Small - EDP CHIEF COMPLAINT:  Acute respiratory failure   History of Present Illness:  41 year old man who presented to [facility] ED 2/11 for respiratory distress. PMHx significant for HTN, HLD, [id number], chronic diastolic heart failure (TTE 01/2021 with EF 0000000, grade 3 diastolic dysfunction), CVA, CKD stage IIIa (baseline Cr ~3.0), substance abuse. Recently hospitalized 2/1-2/4 for acute-on-chronic diastolic heart failure and acute-on-chronic kidney injury presenting with dyspnea, PND, worsening pedal edema and elevated BNP. Renal US was WNL.  He was treated with Lasix and discharged home.   Returned 2/11 with worsening respiratory distress. Initially treated as asthma exacerbation with epinephrine by EMS, but found in the ER to be hypertensive with SBP 180-200s, DBP 100s. ABG with respiratory acidosis; mixed picture with sats 80s.  CXR c/w pulmonary edema with significant crackles on exam. Started on NTG gtt, transitioned to nicardipine gtt for goal SBP < 150.  Lasix 40mg  x 1 given.    PCCM was consulted for admission in the setting of respiratory distress, hypertensive urgency.   Pertinent Medical History:  DM  CKD  Substance Abuse  Seizures  Stroke  HTN  Vitamin D Deficiency   Significant Hospital Events:  2/11 Admit  2/13 On BiPAP 18/6, 60% fiO2 2/14 L IJ CVC placed 2/15 Downtitrating Cardene, uptitrating PO antihypertensives. Respiratory status stable on HFNC 15L. Slightly improved but still marginal UOP with aggressive diuresis. 2/16 Continued improvement in O2 needs. Remains hypertensive off of Cardene (stopped given significant fluid burden). HD/CRRT initiation to optimize for RHC. RIJ Trialysis catheter placed. 2/17 CRRT initiated with 3.2L removed (net -4.2L). Salter 10L, tolerating well. Eventual transfer to [facility] once dry for RHC.  Interim  History / Subjective:  Feeling better this morning, in good spirits Notices some improvement in volume, but "a long way to go" Trialysis catheter placed 2/16 and CRRT initiated 3.2L removed with CRRT, net -4.2L/24H Ongoing peripheral edema SBP remains elevated to 170s-180s Clonidine added to avoid Cardene reinitiation  Objective:   Blood pressure (!) 173/113, pulse 67, temperature 98.2 F (36.8 C), temperature source Oral, resp. rate (!) 22, height 5\' 8"  (1.727 m), weight 128.7 kg, SpO2 93 %. CVP:  [9 mmHg-39 mmHg] 21 mmHg      Intake/Output Summary (Last 24 hours) at 05/24/2021 0843 Last data filed at 05/24/2021 0800 Gross per 24 hour  Intake 1324.89 ml  Output 6020.8 ml  Net -4695.91 ml    Filed Weights   05/22/21 0500 05/23/21 0500 05/24/21 0500  Weight: 127.7 kg 128.7 kg 128.7 kg   Physical Examination: General: Acutely-on-chronically ill-appearing middle-aged man in NAD. Sitting up in bed. HEENT: [place]/AT, anicteric sclera, PERRL, moist mucous membranes. Neuro: Awake, oriented x 4. Responds to verbal stimuli. Following commands consistently. Moves all 4 extremities spontaneously. CV: RRR, no m/g/r. PULM: Breathing even and unlabored on Salter HFNC 10L. Lung fields CTAB with faint bibasilar crackles, R > L. GI: Soft, nontender, mildly distended. Normoactive bowel sounds. Extremities: Bilateral symmetric 3+ pitting LE edema noted. Skin: Warm/dry, no rashes.  Resolved Hospital Problem List:    Assessment & Plan:   Acute hypoxemic respiratory failrue due to ARDS, bilateral pneumonia, question pulmonary edema Elevated PCT, lack of response to diuretics, and air bronchograms on CXR argue more for an infectious vs. inflammatory process.  He has had identical presentations to this for the past 5 years recurrent. Nonsmoker, no  further cocaine use, no unusual exposures.  Considered hydralazine-induced pneumonitis; however, ruled out with anti-histone Ab negative (resulted 2/15).  Noted nodular/alveolar infiltrates dating back to 01/2021. HIV negative. CT Chest 2/14 with extensive bilateral airspace disease (R > L), favoring PNA. - Continue Salter HFNC, tolerating well - BiPAP QHS and PRN - Wean supplemental O2 for sat > 90% - Continue steroids - F/u pending AI studies - Follow CXR - Pulmonary hygiene - Diuresis limited by renal function; therefore, CRRT initiated 2/16  Hypertensive Urgency History of CVA, HTN - Continue home amlodipine, Coreg, Imdur, hydralazine (antihistone Ab negative) - Clonidine 0.2mg  BID added for improved SBP control - Hold home valsartan in the setting of AKI on CKD - Labetalol PRN for SBP > 160 - Suspect hypertension will improve once volume status optimized with RRT - CRRT initiated 2/16 as above - Eventual plan for transfer to [facility] early next week (~2/20) once optimized for RHC  Baseline Restrictive Cardiomyopathy Echo here unchanged, maybe more vigorous LV function. - F/u kappa light chains, AI labs - RHC at [facility] likely next week once volume status improved  DM2 - CBGs Q4H - Novolog 4U TID - SSI  AKI on CKD Prior Renal Artery [place] in 01/2021 with normal kidneys, 1-59% stenosis of right / left renal arteries. VAS US Renal Artery negative for stenosis, abnormal RIs. - Nephrology following, appreciate management - CRRT initiated 2/16, tolerating well - Further diuresis held for now in the setting of RRT - Trend BMP - Replete electrolytes as indicated - Monitor I&Os - Avoid nephrotoxic agents as able - Ensure adequate renal perfusion  Best Practice: (right click and "Reselect all SmartList Selections" daily)   Diet/type: Regular consistency (see orders) DVT prophylaxis: prophylactic heparin  GI prophylaxis: PPI Central venous access:  Yes, and it is still needed Foley:  N/A Code Status:  full code Last date of multidisciplinary goals of care discussion [Full Code Status]  Critical Care Time: 30 minutes   Lestine Mount, PA-C [facility] 05/24/21 8:43 AM  Please see [email or web address] for pager details.  From 7A-7P if no response, please call [phone] After hours, please call ELink [phone]

## 2021-05-25 DIAGNOSIS — N179 Acute kidney failure, unspecified: Secondary | ICD-10-CM | POA: Diagnosis not present

## 2021-05-25 DIAGNOSIS — J9601 Acute respiratory failure with hypoxia: Secondary | ICD-10-CM | POA: Diagnosis not present

## 2021-05-25 LAB — RENAL FUNCTION PANEL
Albumin: 2.6 g/dL — ABNORMAL LOW (ref 3.5–5.0)
Albumin: 2.7 g/dL — ABNORMAL LOW (ref 3.5–5.0)
Anion gap: 5 (ref 5–15)
Anion gap: 5 (ref 5–15)
BUN: 53 mg/dL — ABNORMAL HIGH (ref 6–20)
BUN: 44 mg/dL — ABNORMAL HIGH (ref 6–20)
CO2: 27 mmol/L (ref 22–32)
CO2: 27 mmol/L (ref 22–32)
Calcium: 8.4 mg/dL — ABNORMAL LOW (ref 8.9–10.3)
Calcium: 8.2 mg/dL — ABNORMAL LOW (ref 8.9–10.3)
Chloride: 104 mmol/L (ref 98–111)
Chloride: 102 mmol/L (ref 98–111)
Creatinine, Ser: 1.85 mg/dL — ABNORMAL HIGH (ref 0.61–1.24)
Creatinine, Ser: 1.62 mg/dL — ABNORMAL HIGH (ref 0.61–1.24)
GFR, Estimated: 46 mL/min — ABNORMAL LOW (ref >60)
GFR, Estimated: 54 mL/min — ABNORMAL LOW (ref >60)
Glucose, Bld: 185 mg/dL — ABNORMAL HIGH (ref 70–99)
Glucose, Bld: 196 mg/dL — ABNORMAL HIGH (ref 70–99)
Phosphorus: 3.4 mg/dL (ref 2.5–4.6)
Phosphorus: 2.9 mg/dL (ref 2.5–4.6)
Potassium: 4.5 mmol/L (ref 3.5–5.1)
Potassium: 4.7 mmol/L (ref 3.5–5.1)
Sodium: 136 mmol/L (ref 135–145)
Sodium: 134 mmol/L — ABNORMAL LOW (ref 135–145)

## 2021-05-25 LAB — GLUCOSE, CAPILLARY
Glucose-Capillary: 212 mg/dL — ABNORMAL HIGH (ref 70–99)
Glucose-Capillary: 227 mg/dL — ABNORMAL HIGH (ref 70–99)
Glucose-Capillary: 218 mg/dL — ABNORMAL HIGH (ref 70–99)
Glucose-Capillary: 236 mg/dL — ABNORMAL HIGH (ref 70–99)

## 2021-05-25 LAB — CBC
HCT: 29.1 % — ABNORMAL LOW (ref 39.0–52.0)
Hemoglobin: 9.3 g/dL — ABNORMAL LOW (ref 13.0–17.0)
MCH: 26.3 pg (ref 26.0–34.0)
MCHC: 32 g/dL (ref 30.0–36.0)
MCV: 82.2 fL (ref 80.0–100.0)
Platelets: 186 10*3/uL (ref 150–400)
RBC: 3.54 MIL/uL — ABNORMAL LOW (ref 4.22–5.81)
RDW: 17.9 % — ABNORMAL HIGH (ref 11.5–15.5)
WBC: 13.8 10*3/uL — ABNORMAL HIGH (ref 4.0–10.5)
nRBC: 0.7 % — ABNORMAL HIGH (ref 0.0–0.2)

## 2021-05-25 LAB — APTT: aPTT: 32 seconds (ref 24–36)

## 2021-05-25 LAB — MAGNESIUM: Magnesium: 2.7 mg/dL — ABNORMAL HIGH (ref 1.7–2.4)

## 2021-05-25 MED ORDER — CLONIDINE HCL 0.1 MG PO TABS
0.2000 mg | ORAL_TABLET | Freq: Three times a day (TID) | ORAL | Status: DC
Start: 2021-05-25 — End: 2021-05-27
  Administered 2021-05-25 – 2021-05-26 (×5): 0.2 mg via ORAL
  Filled 2021-05-25 (×5): qty 2

## 2021-05-25 NOTE — Progress Notes (Signed)
[facility] Progress Note    Assessment/ Plan:   AKI on CKD3 -baseline Cr overall fluctuant since 01/2021 hospitalization, has been any where between 1.8 - ~3. Underlying CKD likely related to HTN, DM -Cr on presentation 2.4 w/ hyaline casts on urine sediment, suspecting an underlying component of CRS as well if truly with volume, CVP's had been high via central line -given inadequate response to diuretics & the need to get to [place], initiated CRRT 2/16 to help expedite offloading volume (appreciate CCM's assistance with RIJ trialysis catheter on 2/16). Hopefully, renal replacement therapy will be a temporary measure. Pending transfer to [facility] for eventual RHC -tolerating net neg 200cc/hr, increase to net neg 250cc/hr -echogenic kidneys on u/s but no obstruction 2/13, duplex study with patent renal arteries bilaterally -UA w/ sediment exam w/ 100 protein on UA (UPC only 0.4g, UACR 239 mg/g) and hyaline casts. There is also microscopic hematuria (external foley). Neg ANA. ANCA panel neg from 2/11 -Avoid nephrotoxic medications including NSAIDs and iodinated intravenous contrast exposure unless the latter is absolutely indicated.  Preferred narcotic agents for pain control are hydromorphone, fentanyl, and methadone. Morphine should not be used. Avoid Baclofen and avoid oral sodium phosphate and magnesium citrate based laxatives / bowel preps. Continue strict Input and Output monitoring. Will monitor the patient closely with you and intervene or adjust therapy as indicated by changes in clinical status/labs    AHRF, secondary to ARDS, possible b/l PNA, questionable pulm edema -no real improvement with diuretics, currently on hold for above -possible hydralazine induced pneumonitis given recurrent admissions-on hold. Anti histone ab neg -abx/steroids per primary service. -s/p TLC to accurately measure CVP, coox -consider RHC once resp status is more stable--crrt plan as above--UF as  tolerated, increase UF goals to meet net neg [id number]   Metabolic acidosis likely secondary to AKI -stoppednahco3 tabs since he is on CRRT, bicarb wnl  Hyponatremia -hypervolemic -diuresis/offload vol as above, Na WNL now   Hypertension: -on imdur, coreg, amlodipine, clonidine -UF as tolerated, increasing goals   Hyperkalemia -likely related to AKI, improved/resolved   Normocytic anemia: -Transfuse for Hgb<7 g/dL -per primary   Diabetes Mellitus Type 2 with Hyperglycemia -per primary  Gean Quint, MD [facility]   Subjective:   Patient seen and examined in ICU and on CRRT. No acute events. Remains intermittently hypertensive. Tolerating UF goals of net neg 200cc/hr. Patient reports that his breathing is improving. No other complaints. Discussed w/ ICU RN.   Objective:   BP (!) 168/96    Pulse 61    Temp 97.6 F (36.4 C) (Axillary)    Resp 18    Ht 5\' 8"  (1.727 m)    Wt 120.6 kg    SpO2 99%    BMI 40.43 kg/m   Intake/Output Summary (Last 24 hours) at 05/25/2021 1321 Last data filed at 05/25/2021 1300 Gross per 24 hour  Intake 1692 ml  Output 6849 ml  Net -5157 ml   Weight change: -8.1 kg  Physical Exam: Gen:nad, sitting up in bed CVS:rrr, s1s2 Resp: diminished air entry bibasilar, normal WOB [id number], soft Ext:2+ pitting edema b/l le's Neuro: awake, alert Dialysis access: RIJ trialysis catheter  Imaging: DG Chest Port 1 View  Result Date: 05/23/2021 CLINICAL DATA:  Central line placement EXAM: PORTABLE CHEST 1 VIEW COMPARISON:  Earlier today at 12:24 p.m. FINDINGS: 2:09 p.m. Left internal jugular line tip at high SVC. Placement of a right internal jugular line with tip at mid SVC. Midline trachea.  Moderate cardiomegaly. Suspect tiny right pleural effusion. No pneumothorax. Right greater than left interstitial and airspace disease is similar. IMPRESSION: New right-sided central line, terminating at the mid SVC. Similar cardiomegaly with right  greater than left interstitial and airspace disease. Favor infection over pulmonary edema, given appearance on 05/21/2021 CT. Electronically Signed   By: Abigail Miyamoto M.D.   On: 05/23/2021 14:43    Labs: BMET Recent Labs  Lab 05/21/21 0315 05/22/21 1212 05/23/21 0249 05/23/21 1518 05/24/21 0500 05/24/21 1611 05/25/21 0310  NA 131* 130* 133* 132* 135 135 136  K 4.4 4.8 4.5 4.1 4.0 4.6 4.5  CL 104 102 105 105 104 105 104  CO2 17* 18* 20* 19* 25 25 27   GLUCOSE 203* 266* 226* 270* 165* 234* 185*  BUN 86* 107* 119* 105* 80* 67* 53*  CREATININE 3.92* 4.15* 3.93* 3.56* 2.74* 2.25* 1.85*  CALCIUM 8.3* 7.9* 8.2* 7.7* 8.4* 8.0* 8.4*  PHOS 5.4* 6.6* 6.4* 5.9* 4.6 4.2 3.4   CBC Recent Labs  Lab 05/21/21 0315 05/22/21 1212 05/23/21 0249 05/25/21 0310  WBC 14.1* 9.5 10.0 13.8*  HGB 10.0* 8.6* 8.9* 9.3*  HCT 32.7* 27.6* 28.5* 29.1*  MCV 83.6 82.9 81.4 82.2  PLT 263 224 224 186    Medications:     amLODipine  10 mg Oral Daily   carvedilol  25 mg Oral BID WC   chlorhexidine  15 mL Mouth Rinse BID   Chlorhexidine Gluconate Cloth  6 each Topical Q0600   cloNIDine  0.2 mg Oral TID   heparin  5,000 Units Subcutaneous Q8H   insulin aspart  0-20 Units Subcutaneous TID WC   insulin aspart  0-5 Units Subcutaneous QHS   insulin aspart  4 Units Subcutaneous TID WC   isosorbide mononitrate  120 mg Oral Daily   mouth rinse  15 mL Mouth Rinse q12n4p   pantoprazole  40 mg Oral QHS   sodium chloride flush  3 mL Intravenous Q12H      Gean Quint, MD [facility] 05/25/2021, 1:21 PM

## 2021-05-25 NOTE — Plan of Care (Signed)
  Problem: Education: Goal: Knowledge of General Education information will improve Description: Including pain rating scale, medication(s)/side effects and non-pharmacologic comfort measures Outcome: Progressing   Problem: Pain Managment: Goal: General experience of comfort will improve Outcome: Progressing   

## 2021-05-25 NOTE — Progress Notes (Signed)
Pt placed on bipap for the night. °

## 2021-05-25 NOTE — Progress Notes (Signed)
NAME:  Micheal Levy, MRN:  [id number], DOB:  20-Dec-1980, LOS: 7 ADMISSION DATE:  05/18/2021, CONSULTATION DATE: 05/18/2021 REFERRING MD:  Dr. Sedonia Small - EDP CHIEF COMPLAINT:  Acute respiratory failure   History of Present Illness:  41 year old man who presented to [facility] ED 2/11 for respiratory distress. PMHx significant for HTN, HLD, 123456, chronic diastolic heart failure (TTE 01/2021 with EF 0000000, grade 3 diastolic dysfunction), CVA, CKD stage IIIa (baseline Cr ~3.0), substance abuse. Recently hospitalized 2/1-2/4 for acute-on-chronic diastolic heart failure and acute-on-chronic kidney injury presenting with dyspnea, PND, worsening pedal edema and elevated BNP. Renal US was WNL.  He was treated with Lasix and discharged home.   Returned 2/11 with worsening respiratory distress. Initially treated as asthma exacerbation with epinephrine by EMS, but found in the ER to be hypertensive with SBP 180-200s, DBP 100s. ABG with respiratory acidosis; mixed picture with sats 80s.  CXR c/w pulmonary edema with significant crackles on exam. Started on NTG gtt, transitioned to nicardipine gtt for goal SBP < 150.  Lasix 40mg  x 1 given.    PCCM was consulted for admission in the setting of respiratory distress, hypertensive urgency.   Pertinent Medical History:  DM  CKD  Substance Abuse  Seizures  Stroke  HTN  Vitamin D Deficiency   Significant Hospital Events:  2/11 Admit  2/13 On BiPAP 18/6, 60% fiO2 2/14 L IJ CVC placed 2/15 Downtitrating Cardene, uptitrating PO antihypertensives. Respiratory status stable on HFNC 15L. Slightly improved but still marginal UOP with aggressive diuresis. 2/16 Continued improvement in O2 needs. Remains hypertensive off of Cardene (stopped given significant fluid burden). HD/CRRT initiation to optimize for RHC. RIJ Trialysis catheter placed. 2/17 CRRT initiated with 3.2L removed (net -4.2L). Salter 10L, tolerating well. Eventual transfer to [facility] once dry for RHC.  Scheduled  Meds:  amLODipine  10 mg Oral Daily   carvedilol  25 mg Oral BID WC   chlorhexidine  15 mL Mouth Rinse BID   Chlorhexidine Gluconate Cloth  6 each Topical Q0600   cloNIDine  0.2 mg Oral BID   heparin  5,000 Units Subcutaneous Q8H   insulin aspart  0-20 Units Subcutaneous TID WC   insulin aspart  0-5 Units Subcutaneous QHS   insulin aspart  4 Units Subcutaneous TID WC   isosorbide mononitrate  120 mg Oral Daily   mouth rinse  15 mL Mouth Rinse q12n4p   methylPREDNISolone (SOLU-MEDROL) injection  40 mg Intravenous Daily   pantoprazole  40 mg Oral QHS   sodium chloride flush  3 mL Intravenous Q12H   Continuous Infusions:   prismasol BGK 4/2.5 400 mL/hr at 05/25/21 0609    prismasol BGK 4/2.5 400 mL/hr at 05/25/21 [id number]   sodium chloride Stopped (05/24/21 1059)   heparin 10,000 units/ 20 mL infusion syringe 750 Units/hr (05/24/21 2319)   prismasol BGK 4/2.5 1,500 mL/hr at 05/25/21 0834   PRN Meds:.sodium chloride, acetaminophen, docusate sodium, heparin, heparin, labetalol, polyethylene glycol, sodium chloride flush    Interim History / Subjective:   Able to lie back more comfortably now/ bp still up despite norvasc, coreg and clonidine  Objective:   Blood pressure (!) 163/106, pulse 61, temperature 98.1 F (36.7 C), temperature source Oral, resp. rate 16, height 5\' 8"  (1.727 m), weight 120.6 kg, SpO2 99 %.    FiO2 (%):  [50 %] 50 %   Intake/Output Summary (Last 24 hours) at 05/25/2021 1117 Last data filed at 05/25/2021 1100 Gross per 24 hour  Intake 1978 ml  Output 7293 ml  Net -5315 ml   Filed Weights   05/23/21 0500 05/24/21 0500 05/25/21 0631  Weight: 128.7 kg 128.7 kg 120.6 kg   Physical Examination: Tmax  99 General appearance:    obese wm nad at 45 degrees hob  At Rest 02 sats  100% on 8lpm   No jvd Oropharynx clear,  mucosa nl Neck supple Lungs with min  rhonchi bilaterally RRR no s3 or or sign murmur Abd obese with limited excursion, eating ok  Extr warm with  no edema or clubbing noted Neuro  Sensorium intact,  no apparent motor deficits   Resolved Hospital Problem List:    Assessment & Plan:   Acute hypoxemic respiratory failrue due to ARDS, bilateral pneumonia, question pulmonary edema Elevated PCT, lack of response to diuretics, and air bronchograms on CXR argue more for an infectious vs. inflammatory process.  He has had identical presentations to this for the past 5 years recurrent. Nonsmoker, no further cocaine use, no unusual exposures.  Considered hydralazine-induced pneumonitis; however, ruled out with anti-histone Ab negative (resulted 2/15). Noted nodular/alveolar infiltrates dating back to 01/2021. HIV negative. CT Chest 2/14 with extensive bilateral airspace disease (R > L), favoring PNA. - Wean supplemental O2 for sat > 90% - try off steroids 2/18  - Follow CXR - Pulmonary hygiene - Diuresis limited by renal function; therefore, CRRT initiated 2/16 and tol well   Hypertensive Urgency History of CVA, HTN - Continue home amlodipine, Coreg, Imdur  - Clonidine 0.2mg  BID added 2/16  for improved SBP control - increased to tid 2/18  - Labetalol PRN for SBP > 160 - Suspect hypertension will improve once volume status optimized with RRT - CRRT initiated 2/16 as above - Eventual plan for transfer to [facility] early next week (~2/20) once optimized for RHC  Baseline Restrictive Cardiomyopathy Echo here unchanged, maybe more vigorous LV function. - F/u kappa light chains, AI labs - RHC at [facility] likely next week once volume status improved  DM2 - CBGs Q4H - Novolog 4U TID - SSI  AKI on CKD Prior Renal Artery [place] in 01/2021 with normal kidneys, 1-59% stenosis of right / left renal arteries. VAS US Renal Artery negative for stenosis, abnormal RIs. - Nephrology following, appreciate management - CRRT initiated 2/16, tolerating well - Further diuresis held for now in the setting of RRT - Trend BMP - Replete electrolytes as indicated -  Monitor I&Os - Avoid nephrotoxic agents as able - Ensure adequate renal perfusion  Best Practice: (right click and "Reselect all SmartList Selections" daily)   Diet/type: Regular consistency (see orders) DVT prophylaxis: prophylactic heparin  GI prophylaxis: PPI Central venous access:  Yes, and it is still needed Foley:  N/A Code Status:  full code   Christinia Gully, MD Pulmonary and [place] Cell [phone]   After 7:00 pm call Elink  [phone]

## 2021-05-25 NOTE — Progress Notes (Signed)
Pt states that he is not ready for BIPAP QHS at this time.  Pt to notify RT/RN when ready. RN aware.

## 2021-05-26 ENCOUNTER — Inpatient Hospital Stay (HOSPITAL_COMMUNITY): Payer: Medicaid Other

## 2021-05-26 DIAGNOSIS — J9601 Acute respiratory failure with hypoxia: Secondary | ICD-10-CM | POA: Diagnosis not present

## 2021-05-26 DIAGNOSIS — N179 Acute kidney failure, unspecified: Secondary | ICD-10-CM | POA: Diagnosis not present

## 2021-05-26 LAB — CBC
HCT: 30.4 % — ABNORMAL LOW (ref 39.0–52.0)
Hemoglobin: 9.5 g/dL — ABNORMAL LOW (ref 13.0–17.0)
MCH: 25.8 pg — ABNORMAL LOW (ref 26.0–34.0)
MCHC: 31.3 g/dL (ref 30.0–36.0)
MCV: 82.6 fL (ref 80.0–100.0)
Platelets: 175 10*3/uL (ref 150–400)
RBC: 3.68 MIL/uL — ABNORMAL LOW (ref 4.22–5.81)
RDW: 18.4 % — ABNORMAL HIGH (ref 11.5–15.5)
WBC: 14.7 10*3/uL — ABNORMAL HIGH (ref 4.0–10.5)
nRBC: 0.3 % — ABNORMAL HIGH (ref 0.0–0.2)

## 2021-05-26 LAB — GLUCOSE, CAPILLARY
Glucose-Capillary: 154 mg/dL — ABNORMAL HIGH (ref 70–99)
Glucose-Capillary: 157 mg/dL — ABNORMAL HIGH (ref 70–99)
Glucose-Capillary: 163 mg/dL — ABNORMAL HIGH (ref 70–99)
Glucose-Capillary: 167 mg/dL — ABNORMAL HIGH (ref 70–99)

## 2021-05-26 LAB — RENAL FUNCTION PANEL
Albumin: 2.8 g/dL — ABNORMAL LOW (ref 3.5–5.0)
Albumin: 2.8 g/dL — ABNORMAL LOW (ref 3.5–5.0)
Anion gap: 4 — ABNORMAL LOW (ref 5–15)
Anion gap: 4 — ABNORMAL LOW (ref 5–15)
BUN: 41 mg/dL — ABNORMAL HIGH (ref 6–20)
BUN: 38 mg/dL — ABNORMAL HIGH (ref 6–20)
CO2: 27 mmol/L (ref 22–32)
CO2: 26 mmol/L (ref 22–32)
Calcium: 8.6 mg/dL — ABNORMAL LOW (ref 8.9–10.3)
Calcium: 8.1 mg/dL — ABNORMAL LOW (ref 8.9–10.3)
Chloride: 104 mmol/L (ref 98–111)
Chloride: 103 mmol/L (ref 98–111)
Creatinine, Ser: 1.54 mg/dL — ABNORMAL HIGH (ref 0.61–1.24)
Creatinine, Ser: 1.62 mg/dL — ABNORMAL HIGH (ref 0.61–1.24)
GFR, Estimated: 57 mL/min — ABNORMAL LOW (ref >60)
GFR, Estimated: 54 mL/min — ABNORMAL LOW (ref >60)
Glucose, Bld: 171 mg/dL — ABNORMAL HIGH (ref 70–99)
Glucose, Bld: 169 mg/dL — ABNORMAL HIGH (ref 70–99)
Phosphorus: 3 mg/dL (ref 2.5–4.6)
Phosphorus: 2.3 mg/dL — ABNORMAL LOW (ref 2.5–4.6)
Potassium: 4.6 mmol/L (ref 3.5–5.1)
Potassium: 4.1 mmol/L (ref 3.5–5.1)
Sodium: 135 mmol/L (ref 135–145)
Sodium: 133 mmol/L — ABNORMAL LOW (ref 135–145)

## 2021-05-26 LAB — APTT: aPTT: 33 seconds (ref 24–36)

## 2021-05-26 LAB — MAGNESIUM: Magnesium: 2.7 mg/dL — ABNORMAL HIGH (ref 1.7–2.4)

## 2021-05-26 MED ORDER — MINOXIDIL 2.5 MG PO TABS
5.0000 mg | ORAL_TABLET | Freq: Two times a day (BID) | ORAL | Status: DC
  Administered 2021-05-26 – 2021-05-28 (×5): 5 mg via ORAL
  Filled 2021-05-26 (×5): qty 2

## 2021-05-26 MED ORDER — ORAL CARE MOUTH RINSE
15.0000 mL | Freq: Two times a day (BID) | OROMUCOSAL | Status: DC
  Administered 2021-05-26 – 2021-06-06 (×12): 15 mL via OROMUCOSAL

## 2021-05-26 NOTE — Plan of Care (Signed)
°  Problem: Education: Goal: Knowledge of General Education information will improve Description: Including pain rating scale, medication(s)/side effects and non-pharmacologic comfort measures Outcome: Progressing   Problem: Activity: Goal: Risk for activity intolerance will decrease Outcome: Progressing   Problem: Pain Managment: Goal: General experience of comfort will improve Outcome: Progressing   Problem: Safety: Goal: Ability to remain free from injury will improve Outcome: Progressing   Problem: Skin Integrity: Goal: Risk for impaired skin integrity will decrease Outcome: Progressing   Cindy S. Brigitte Pulse BSN, RN, [place] 05/26/2021 2:30 AM

## 2021-05-26 NOTE — Progress Notes (Signed)
[facility] Progress Note    Assessment/ Plan:   AKI on CKD3 -baseline Cr overall fluctuant since 01/2021 hospitalization, has been any where between 1.8 - ~3. Underlying CKD likely related to HTN, DM -Cr on presentation 2.4 w/ hyaline casts on urine sediment, suspecting an underlying component of CRS as well if truly with volume, CVP's had been high via central line -given inadequate response to diuretics & the need to get to [place], initiated CRRT 2/16 to help expedite offloading volume (appreciate CCM's assistance with RIJ trialysis catheter on 2/16). Hopefully, renal replacement therapy will be a temporary measure. Pending transfer to [facility] for eventual RHC -tolerating net neg 200cc/hr, increased to net neg 250cc/hr which he is tolerating--will continue -echogenic kidneys on u/s but no obstruction 2/13, duplex study with patent renal arteries bilaterally -UA w/ sediment exam w/ 100 protein on UA (UPC only 0.4g, UACR 239 mg/g) and hyaline casts. There is also microscopic hematuria (external foley). Neg ANA. ANCA panel neg from 2/11 -Avoid nephrotoxic medications including NSAIDs and iodinated intravenous contrast exposure unless the latter is absolutely indicated.  Preferred narcotic agents for pain control are hydromorphone, fentanyl, and methadone. Morphine should not be used. Avoid Baclofen and avoid oral sodium phosphate and magnesium citrate based laxatives / bowel preps. Continue strict Input and Output monitoring. Will monitor the patient closely with you and intervene or adjust therapy as indicated by changes in clinical status/labs    AHRF, secondary to ARDS, possible b/l PNA, questionable pulm edema -no real improvement with diuretics -possible hydralazine induced pneumonitis given recurrent admissions-on hold. Anti histone ab neg -abx/steroids per primary service. -consider RHC once resp status is more stable--crrt plan as above--UF as tolerated, increase UF goals to meet  net neg [id number]   Metabolic acidosis likely secondary to AKI -stopped nahco3 tabs since he is on CRRT, bicarb wnl  Hyponatremia -hypervolemic -diuresis/offload vol as above, Na WNL today   Hypertension, hypertensive urgency: -on imdur, coreg, amlodipine, clonidine -UF as tolerated on CRRT   Hyperkalemia -likely related to AKI, improved/resolved   Normocytic anemia: -Transfuse for Hgb<7 g/dL -per primary   Diabetes Mellitus Type 2 with Hyperglycemia -per primary  Gean Quint, MD [facility]   Subjective:   Patient seen and examined in ICU and on CRRT. Remains hypertensive. Tolerating net uf 250cc/hr Net neg ~6.4L Uop 725cc Tentatively planned for [facility] transfer on 2/20 No complaints, has been tolerating crrt. Feels less 'puffy'   Objective:   BP (!) 192/114    Pulse (!) 50    Temp 97.8 F (36.6 C) (Oral)    Resp 17    Ht 5\' 8"  (1.727 m)    Wt 113.9 kg    SpO2 99%    BMI 38.18 kg/m   Intake/Output Summary (Last 24 hours) at 05/26/2021 [id number] Last data filed at 05/26/2021 0700 Gross per 24 hour  Intake 1056 ml  Output 7258 ml  Net -6202 ml   Weight change: -6.7 kg  Physical Exam: Gen:nad, sitting up in bed, in good spirits CVS:rrr, s1s2 Resp: normal WOB [id number], soft Ext:2+ pitting edema b/l le's Neuro: awake, alert, speech clear and coherent, moves all ext spontaneously Dialysis access: RIJ trialysis catheter  Imaging: DG Chest Port 1 View  Result Date: 05/26/2021 CLINICAL DATA:  Acute respiratory failure EXAM: PORTABLE CHEST 1 VIEW COMPARISON:  Radiograph 05/23/2021 FINDINGS: Right neck catheter tip overlies the distal superior vena cava. Left neck catheter is been removed. Unchanged enlarged cardiac silhouette. There are bilateral  interstitial and airspace opacities, right greater than left, not significantly changed from prior. There is a small right pleural effusion, unchanged. No visible pneumothorax. There is no acute osseous abnormality.  IMPRESSION: Left neck catheter has been removed.  Unchanged right neck catheter. Unchanged cardiomegaly with right greater left interstitial and airspace disease and small right effusion, favoring multifocal infection over pulmonary edema. Electronically Signed   By: Maurine Simmering M.D.   On: 05/26/2021 08:47    Labs: BMET Recent Labs  Lab 05/23/21 0249 05/23/21 1518 05/24/21 0500 05/24/21 1611 05/25/21 0310 05/25/21 1619 05/26/21 0253  NA 133* 132* 135 135 136 134* 135  K 4.5 4.1 4.0 4.6 4.5 4.7 4.6  CL 105 105 104 105 104 102 104  CO2 20* 19* 25 25 27 27 27   GLUCOSE 226* 270* 165* 234* 185* 196* 171*  BUN 119* 105* 80* 67* 53* 44* 41*  CREATININE 3.93* 3.56* 2.74* 2.25* 1.85* 1.62* 1.54*  CALCIUM 8.2* 7.7* 8.4* 8.0* 8.4* 8.2* 8.6*  PHOS 6.4* 5.9* 4.6 4.2 3.4 2.9 3.0   CBC Recent Labs  Lab 05/22/21 1212 05/23/21 0249 05/25/21 0310 05/26/21 0253  WBC 9.5 10.0 13.8* 14.7*  HGB 8.6* 8.9* 9.3* 9.5*  HCT 27.6* 28.5* 29.1* 30.4*  MCV 82.9 81.4 82.2 82.6  PLT 224 224 186 175    Medications:     amLODipine  10 mg Oral Daily   carvedilol  25 mg Oral BID WC   chlorhexidine  15 mL Mouth Rinse BID   Chlorhexidine Gluconate Cloth  6 each Topical Q0600   cloNIDine  0.2 mg Oral TID   heparin  5,000 Units Subcutaneous Q8H   insulin aspart  0-20 Units Subcutaneous TID WC   insulin aspart  0-5 Units Subcutaneous QHS   insulin aspart  4 Units Subcutaneous TID WC   isosorbide mononitrate  120 mg Oral Daily   mouth rinse  15 mL Mouth Rinse q12n4p   pantoprazole  40 mg Oral QHS   sodium chloride flush  3 mL Intravenous Q12H      Gean Quint, MD [facility] 05/26/2021, 8:52 AM

## 2021-05-26 NOTE — Progress Notes (Addendum)
NAME:  Micheal Levy, MRN:  [id number], DOB:  27-Apr-1980, LOS: 8 ADMISSION DATE:  05/18/2021, CONSULTATION DATE: 05/18/2021 REFERRING MD:  Dr. Pilar Plate - EDP CHIEF COMPLAINT:  Acute respiratory failure   History of Present Illness:  41 year old man who presented to [facility] ED 2/11 for respiratory distress. PMHx significant for HTN, HLD, T2DM, chronic diastolic heart failure (TTE 01/2021 with EF 55-60%, grade 3 diastolic dysfunction), CVA, CKD stage IIIa (baseline Cr ~3.0), substance abuse. Recently hospitalized 2/1-2/4 for acute-on-chronic diastolic heart failure and acute-on-chronic kidney injury presenting with dyspnea, PND, worsening pedal edema and elevated BNP. Renal US was WNL.  He was treated with Lasix and discharged home.   Returned 2/11 with worsening respiratory distress. Initially treated as asthma exacerbation with epinephrine by EMS, but found in the ER to be hypertensive with SBP 180-200s, DBP 100s. ABG with respiratory acidosis; mixed picture with sats 80s.  CXR c/w pulmonary edema with significant crackles on exam. Started on NTG gtt, transitioned to nicardipine gtt for goal SBP < 150.  Lasix 40mg  x 1 given.    PCCM was consulted for admission in the setting of respiratory distress, hypertensive urgency.   Pertinent Medical History:  DM  CKD  Substance Abuse  Seizures  Stroke  HTN  Vitamin D Deficiency   Significant Hospital Events:  2/11 Admit  2/13 On BiPAP 18/6, 60% fiO2 2/14 L IJ CVC placed 2/15 Downtitrating Cardene, uptitrating PO antihypertensives. Respiratory status stable on HFNC 15L. Slightly improved but still marginal UOP with aggressive diuresis. 2/16 Continued improvement in O2 needs. Remains hypertensive off of Cardene (stopped given significant fluid burden). HD/CRRT initiation to optimize for RHC. RIJ Trialysis catheter placed. 2/17 CRRT initiated with 3.2L removed (net -4.2L). Salter 10L, tolerating well. Eventual transfer to [facility] once dry for RHC.  Scheduled  Meds:  amLODipine  10 mg Oral Daily   carvedilol  25 mg Oral BID WC   chlorhexidine  15 mL Mouth Rinse BID   Chlorhexidine Gluconate Cloth  6 each Topical Q0600   cloNIDine  0.2 mg Oral TID   heparin  5,000 Units Subcutaneous Q8H   insulin aspart  0-20 Units Subcutaneous TID WC   insulin aspart  0-5 Units Subcutaneous QHS   insulin aspart  4 Units Subcutaneous TID WC   isosorbide mononitrate  120 mg Oral Daily   mouth rinse  15 mL Mouth Rinse q12n4p   pantoprazole  40 mg Oral QHS   sodium chloride flush  3 mL Intravenous Q12H   Continuous Infusions:   prismasol BGK 4/2.5 400 mL/hr at 05/26/21 0628    prismasol BGK 4/2.5 400 mL/hr at 05/26/21 1561   sodium chloride Stopped (05/24/21 1059)   heparin 10,000 units/ 20 mL infusion syringe 750 Units/hr (05/26/21 0052)   prismasol BGK 4/2.5 1,500 mL/hr at 05/26/21 1020   PRN Meds:.sodium chloride, acetaminophen, docusate sodium, heparin, heparin, labetalol, polyethylene glycol, sodium chloride flush    Interim History / Subjective:  Continues to feel better daily despite marked elevation of bp on multiple agents and neg fluid balance on CVVH   Objective:   Blood pressure (!) 180/111, pulse (!) 59, temperature 98.4 F (36.9 C), temperature source Oral, resp. rate 19, height 5\' 8"  (1.727 m), weight 113.9 kg, SpO2 95 %.        Intake/Output Summary (Last 24 hours) at 05/26/2021 1301 Last data filed at 05/26/2021 1200 Gross per 24 hour  Intake 1296 ml  Output 7758 ml  Net -6462 ml  Filed Weights   05/24/21 0500 05/25/21 0631 05/26/21 0500  Weight: 128.7 kg 120.6 kg 113.9 kg   Physical Examination: Tmax   98.2  General appearance:    obese pleasant bm nad   At Rest 02 sats  98% on 6lpm   No jvd Oropharynx clear,  mucosa nl Neck supple Lungs with a few scattered exp > insp rhonchi bilaterally RRR no s3 or or sign murmur Abd obese with limited excursion  Extr warm with no significant peripheral edema or clubbing noted Neuro   Sensorium alert,  no apparent motor deficits    I personally reviewed images and  impression as follows:  CXR:   portable 2/19 Overall much  improved aeration since admit but very little change from the most recent   Resolved Hospital Problem List:    Assessment & Plan:   Acute hypoxemic respiratory failrue due to ARDS, bilateral pneumonia, question pulmonary edema Elevated PCT, lack of response to diuretics, and air bronchograms on CXR argue more for an infectious vs. inflammatory process.  He has had identical presentations to this for the past 5 years recurrent. Nonsmoker, no further cocaine use, no unusual exposures.  Considered hydralazine-induced pneumonitis; however, ruled out with anti-histone Ab negative (resulted 2/15). Noted nodular/alveolar infiltrates dating back to 01/2021. HIV negative. CT Chest 2/14 with extensive bilateral airspace disease (R > L), favoring PNA. - Wean supplemental O2 for sat > 90% - try off steroids 2/18  - Follow CXR 2/10  - Pulmonary hygiene - Diuresis limited by renal function; therefore, CRRT initiated 2/16 and tol well   Hypertensive Urgency History of CVA, HTN - Continue home  Coreg, Imdur  - Clonidine 0.2mg  BID added 2/16  for improved SBP control - increased to tid 2/18  - Labetalol PRN for SBP > 160 - Suspect hypertension will improve once volume status optimized with RRT - CRRT initiated 2/16 as above - Eventual plan for transfer to [facility] early next week (~2/20) once optimized for RHC - changed amlodine to minoxidil 2/19 due refractory hbp on amlodipine 10 mg daily   Baseline Restrictive Cardiomyopathy Echo here unchanged, maybe more vigorous LV function. - F/u kappa light chains, AI labs - RHC at [facility]  week of 2/20  DM2 - CBGs Q4H - Novolog 4U TID - SSI  AKI on CKD Prior Renal Artery [place] in 01/2021 with normal kidneys, 1-59% stenosis of right / left renal arteries. VAS US Renal Artery negative for stenosis, abnormal RIs. - Nephrology  following, appreciate management - CRRT initiated 2/16, tolerating well - Trend BMP - Replete electrolytes as indicated - Monitor I&Os - Avoid nephrotoxic agents as able - Ensure adequate renal perfusion  Best Practice: (right click and "Reselect all SmartList Selections" daily)   Diet/type: Regular consistency (see orders) DVT prophylaxis: prophylactic heparin  GI prophylaxis: PPI Central venous access:  Yes, and it is still needed Foley:  N/A Code Status:  full code     Christinia Gully, MD [facility] [place] [facility] [phone]   After 7:00 pm call Elink  [phone]

## 2021-05-26 NOTE — Progress Notes (Signed)
Pt states that he does not want to wear BIPAP tonight.  Pt currently on 4 LPM Salter [place] and tolerating well.  Pt to notify RT if he changes his mind.  RN aware.

## 2021-05-27 DIAGNOSIS — J9601 Acute respiratory failure with hypoxia: Secondary | ICD-10-CM | POA: Diagnosis not present

## 2021-05-27 LAB — CBC
HCT: 31.2 % — ABNORMAL LOW (ref 39.0–52.0)
Hemoglobin: 9.5 g/dL — ABNORMAL LOW (ref 13.0–17.0)
MCH: 26 pg (ref 26.0–34.0)
MCHC: 30.4 g/dL (ref 30.0–36.0)
MCV: 85.2 fL (ref 80.0–100.0)
Platelets: 166 10*3/uL (ref 150–400)
RBC: 3.66 MIL/uL — ABNORMAL LOW (ref 4.22–5.81)
RDW: 18.6 % — ABNORMAL HIGH (ref 11.5–15.5)
WBC: 11.2 10*3/uL — ABNORMAL HIGH (ref 4.0–10.5)
nRBC: 0.4 % — ABNORMAL HIGH (ref 0.0–0.2)

## 2021-05-27 LAB — GLUCOSE, CAPILLARY
Glucose-Capillary: 136 mg/dL — ABNORMAL HIGH (ref 70–99)
Glucose-Capillary: 148 mg/dL — ABNORMAL HIGH (ref 70–99)
Glucose-Capillary: 164 mg/dL — ABNORMAL HIGH (ref 70–99)
Glucose-Capillary: 169 mg/dL — ABNORMAL HIGH (ref 70–99)

## 2021-05-27 LAB — RENAL FUNCTION PANEL
Albumin: 2.7 g/dL — ABNORMAL LOW (ref 3.5–5.0)
Albumin: 2.8 g/dL — ABNORMAL LOW (ref 3.5–5.0)
Anion gap: 4 — ABNORMAL LOW (ref 5–15)
Anion gap: 4 — ABNORMAL LOW (ref 5–15)
BUN: 35 mg/dL — ABNORMAL HIGH (ref 6–20)
BUN: 33 mg/dL — ABNORMAL HIGH (ref 6–20)
CO2: 28 mmol/L (ref 22–32)
CO2: 27 mmol/L (ref 22–32)
Calcium: 8.4 mg/dL — ABNORMAL LOW (ref 8.9–10.3)
Calcium: 8 mg/dL — ABNORMAL LOW (ref 8.9–10.3)
Chloride: 103 mmol/L (ref 98–111)
Chloride: 103 mmol/L (ref 98–111)
Creatinine, Ser: 1.66 mg/dL — ABNORMAL HIGH (ref 0.61–1.24)
Creatinine, Ser: 1.62 mg/dL — ABNORMAL HIGH (ref 0.61–1.24)
GFR, Estimated: 52 mL/min — ABNORMAL LOW (ref >60)
GFR, Estimated: 54 mL/min — ABNORMAL LOW (ref >60)
Glucose, Bld: 154 mg/dL — ABNORMAL HIGH (ref 70–99)
Glucose, Bld: 143 mg/dL — ABNORMAL HIGH (ref 70–99)
Phosphorus: 2.3 mg/dL — ABNORMAL LOW (ref 2.5–4.6)
Phosphorus: 2.2 mg/dL — ABNORMAL LOW (ref 2.5–4.6)
Potassium: 4.6 mmol/L (ref 3.5–5.1)
Potassium: 4.6 mmol/L (ref 3.5–5.1)
Sodium: 135 mmol/L (ref 135–145)
Sodium: 134 mmol/L — ABNORMAL LOW (ref 135–145)

## 2021-05-27 LAB — APTT: aPTT: 41 seconds — ABNORMAL HIGH (ref 24–36)

## 2021-05-27 LAB — MAGNESIUM: Magnesium: 2.7 mg/dL — ABNORMAL HIGH (ref 1.7–2.4)

## 2021-05-27 MED ORDER — GLUCERNA SHAKE PO LIQD
237.0000 mL | Freq: Three times a day (TID) | ORAL | Status: DC
  Administered 2021-05-27 – 2021-05-28 (×4): 237 mL via ORAL
  Filled 2021-05-27 (×6): qty 237

## 2021-05-27 MED ORDER — CLONIDINE HCL 0.2 MG PO TABS
0.3000 mg | ORAL_TABLET | Freq: Three times a day (TID) | ORAL | Status: DC
  Administered 2021-05-27 – 2021-06-08 (×35): 0.3 mg via ORAL
  Filled 2021-05-27 (×35): qty 1

## 2021-05-27 NOTE — Progress Notes (Signed)
[facility] Progress Note  Subjective: seen in room, net neg 6.1 L yesterday. BP's are normal.   Vitals:   05/27/21 1000 05/27/21 1100 05/27/21 1140 05/27/21 1200  BP: (!) 151/80 (!) 142/84  128/72  Pulse: 74 69  66  Resp: (!) 21 (!) 25  (!) 22  Temp:   98.4 F (36.9 C)   TempSrc:   Oral   SpO2: 95% 95%  95%  Weight:      Height:        Exam: Gen:nad, sitting up in bed, in good spirits CVS:rrr, s1s2 Resp: normal WOB RPZ:PSUGA, soft Ext:2+ pitting edema b/l le's Neuro: awake, alert, speech clear and coherent, moves all ext spontaneously Dialysis access: RIJ trialysis catheter     Summary: 41 yo w/ hx of HTN, DM, CKD b/l creat 1.8- 3 admitted for resp distress. Had recent admit for decomp diast CHF and AKI/ CKD from 2/1- 2/4 rx'd w/ diuresis then dc'd home. Returned on 2/11 w/ resp distress, uncont HTN and hypoxemia. CXR showed pulm edema, started on IV cardene and IV lasix. Poor response to IV lasix so started on CRRT 2/16 for volume reduction. Needs RHC at [facility] when stable.    Assessment/ Plan: AKI on CKD 3 - b/l creat 1.8- 3. Creat here 2.4 on admit, peaked at 3.9 and CRRT started 2/16. Started CRRT 2/16 for massive vol overload on  Vol overload - down 19 L w/ CRRT started 2/16 and wt's down 128kg > 110 kg today. Pulling about 5-6 L per day here. CXR showed diffuse edema on admit and is about 75% better. Cont CRRT. Cards following.  AHRF - d/t ARDS vs poss PNA vs pulm edema Met acidosis - resolved HTN urgency - on multiple NG meds, BP better, sp Cardene gtt initially Anemia - normal mcv, tx prn Hb <7 DM 2     Rob Saharah Sherrow 05/27/2021, 12:40 PM   Recent Labs  Lab 05/26/21 0253 05/26/21 1448 05/27/21 0251  K 4.6 4.1 4.6  BUN 41* 38* 35*  CREATININE 1.54* 1.62* 1.66*  ALBUMIN 2.8* 2.8* 2.7*  CALCIUM 8.6* 8.1* 8.4*  PHOS 3.0 2.3* 2.3*  HGB 9.5*  --  9.5*    Inpatient medications:  carvedilol  25 mg Oral BID WC   Chlorhexidine Gluconate Cloth  6  each Topical Q0600   cloNIDine  0.3 mg Oral TID   feeding supplement (GLUCERNA SHAKE)  237 mL Oral TID BM   heparin  5,000 Units Subcutaneous Q8H   insulin aspart  0-20 Units Subcutaneous TID WC   insulin aspart  0-5 Units Subcutaneous QHS   insulin aspart  4 Units Subcutaneous TID WC   isosorbide mononitrate  120 mg Oral Daily   mouth rinse  15 mL Mouth Rinse BID   minoxidil  5 mg Oral BID   pantoprazole  40 mg Oral QHS   sodium chloride flush  3 mL Intravenous Q12H     prismasol BGK 4/2.5 400 mL/hr at 05/27/21 0703    prismasol BGK 4/2.5 400 mL/hr at 05/27/21 0703   sodium chloride Stopped (05/24/21 1059)   heparin 10,000 units/ 20 mL infusion syringe 750 Units/hr (05/27/21 0201)   prismasol BGK 4/2.5 1,500 mL/hr at 05/27/21 1158   sodium chloride, acetaminophen, docusate sodium, heparin, heparin, labetalol, polyethylene glycol, sodium chloride flush

## 2021-05-27 NOTE — Progress Notes (Addendum)
NAME:  Micheal Levy, MRN:  [id number], DOB:  08/28/1980, LOS: 9 ADMISSION DATE:  05/18/2021, CONSULTATION DATE: 05/18/2021 REFERRING MD:  Dr. Sedonia Small - EDP CHIEF COMPLAINT:  Acute respiratory failure   History of Present Illness:  41 year old man who presented to [facility] ED 2/11 for respiratory distress. PMHx significant for HTN, HLD, [id number], chronic diastolic heart failure (TTE 01/2021 with EF 0000000, grade 3 diastolic dysfunction), CVA, CKD stage IIIa (baseline Cr ~3.0), substance abuse. Recently hospitalized 2/1-2/4 for acute-on-chronic diastolic heart failure and acute-on-chronic kidney injury presenting with dyspnea, PND, worsening pedal edema and elevated BNP. Renal US was WNL.  He was treated with Lasix and discharged home.   Returned 2/11 with worsening respiratory distress. Initially treated as asthma exacerbation with epinephrine by EMS, but found in the ER to be hypertensive with SBP 180-200s, DBP 100s. ABG with respiratory acidosis; mixed picture with sats 80s.  CXR concerning for pulmonary edema with significant crackles on exam. Started on NTG gtt, transitioned to nicardipine gtt for goal SBP < 150.  Lasix 40mg  x 1 given.    PCCM was consulted for admission in the setting of respiratory distress, hypertensive urgency.   Pertinent Medical History:  DM  CKD  Substance Abuse  Seizures  Stroke  HTN  Vitamin D Deficiency   Significant Hospital Events:  2/11 Admit  2/13 On BiPAP 18/6, 60% fiO2 2/14 L IJ CVC placed 2/15 Downtitrating Cardene, uptitrating PO antihypertensives. Respiratory status stable on HFNC 15L. Slightly improved but still marginal UOP with aggressive diuresis. 2/16 Continued improvement in O2 needs. Remains hypertensive off of Cardene (stopped given significant fluid burden). HD/CRRT initiation to optimize for RHC. RIJ Trialysis catheter placed. 2/17 CRRT initiated with 3.2L removed (net -4.2L). Salter 10L, tolerating well.  2/19 Subjectively feeling better, CXR not  changed, elevated BP despite multiple agents, on CVVH   Interim History / Subjective:  Afebrile  2L [place] UOP 948ml, UF 6.4L / overall neg 5.9L in last 24 hours Pt reports feeling better > breathing easier  Objective:   Blood pressure (!) 162/97, pulse (!) 58, temperature 97.9 F (36.6 C), temperature source Oral, resp. rate (!) 22, height 5\' 8"  (1.727 m), weight 110.5 kg, SpO2 97 %.        Intake/Output Summary (Last 24 hours) at 05/27/2021 0727 Last data filed at 05/27/2021 0700 Gross per 24 hour  Intake 1416 ml  Output 7377 ml  Net -5961 ml   Filed Weights   05/25/21 0631 05/26/21 0500 05/27/21 0500  Weight: 120.6 kg 113.9 kg 110.5 kg   Physical Exam: General: young adult male lying in bed in NAD, family at bedside HEENT: MM pink/moist, [place] O2, anicteric, good dentition  Neuro: AAOx4, speech clear, MAE CV: s1s2 RRR, no m/r/g PULM: non-labored at rest, lungs bilaterally clear anterior, basilar crackles  GI: soft, bsx4 active  Extremities: warm/dry, BLE 3+ pitting edema  Skin: no rashes or lesions  Resolved Hospital Problem List:    Assessment & Plan:   Acute hypoxemic respiratory failrue due to ARDS, bilateral infiltrates Pulmonary Hypertension (by ECHO) Elevated PCT, lack of response to diuretics, and air bronchograms on CXR argue more for an infectious vs. inflammatory process.  He has had identical presentations to this for the past 5 years recurrent. Nonsmoker, no further cocaine use, no unusual exposures.  Considered hydralazine-induced pneumonitis; however, ruled out with anti-histone Ab negative (resulted 2/15). Noted nodular/alveolar infiltrates dating back to 01/2021. HIV negative. CT Chest 2/14 with extensive bilateral airspace disease (R >  L).  Non-resolving infiltrates.  -wean O2 for sats >90% -pulmonary hygiene -IS, mobilize  -follow intermittent CXR  -fluid removal with CRRT  -anticipate will need RHC   Hypertensive Urgency History of CVA, HTN -continue  coreg 25 mg BID, imdur 120 mg QD -increase clonidine to 0.3mg  TID  -amlodipine changed to minoxidil on 2/19  -consult Cardiology for HTN eval, weigh in regarding concern for amyloidosis -PRN labetalol  -will likely need RHC at some point, cardiac imaging   Baseline Restrictive Cardiomyopathy Echo here unchanged, maybe more vigorous LV function. -elevated Kappa/Lamda light chains, concern for amyloidosis  -Cardiology consult as above   DM2 -SSI, resistant scale -HS scale -Meal coverate 4 units TID   AKI on CKD Prior Renal Artery [place] in 01/2021 with normal kidneys, 1-59% stenosis of right / left renal arteries. VAS US Renal Artery negative for stenosis, abnormal RIs. -appreciate Nephrology  -CRRT initiated 2/16  -Trend BMP / urinary output -Replace electrolytes as indicated -Avoid nephrotoxic agents, ensure adequate renal perfusion  At Risk Malnutrition  -add ensure BID     Best Practice: (right click and "Reselect all SmartList Selections" daily)  Diet/type: Regular consistency (see orders) DVT prophylaxis: prophylactic heparin  GI prophylaxis: PPI Central venous access:  Yes, and it is still needed Foley:  N/A Code Status:  full code       Noe Gens, MSN, APRN, NP-C, AGACNP-BC [facility] 05/27/2021, 7:27 AM   Please see [email or web address] for pager details.   From 7A-7P if no response, please call [phone] After hours, please call ELink [phone]

## 2021-05-27 NOTE — Plan of Care (Signed)
°  Problem: Education: Goal: Knowledge of General Education information will improve Description: Including pain rating scale, medication(s)/side effects and non-pharmacologic comfort measures Outcome: Progressing   Problem: Activity: Goal: Risk for activity intolerance will decrease Outcome: Progressing   Problem: Pain Managment: Goal: General experience of comfort will improve Outcome: Progressing   Problem: Safety: Goal: Ability to remain free from injury will improve Outcome: Progressing   Problem: Skin Integrity: Goal: Risk for impaired skin integrity will decrease Outcome: Progressing   Cindy S. Clelia Croft BSN, RN, CCRP 05/27/2021 1:15 AM

## 2021-05-27 NOTE — Consult Note (Addendum)
Cardiology Consultation:   Patient ID: Micheal Levy MRN: [id number]; DOB: [birth year]  Admit date: 05/18/2021 Date of Consult: 05/27/2021  PCP:  [name], NP   [facility] Providers Cardiologist:  Berniece Salines, DO   Patient Profile:   Micheal Levy is a 41 y.o. male with a history of diastolic CHF, hypertension, type 2 diabetes mellitus, CKD stage III-IV , stroke, seizure disorder, and substance abuse in remission who is being seen 05/27/2021 for the evaluation of CHF at the request of Dr. Valeta Harms.  History of Present Illness:   Micheal Levy is a 41 year old male with the above history who is followed by Dr. Harriet Masson.  Patient was referred to Dr. Harriet Masson but in 02/2021 to establish cardiac care for uncontrolled hypertension.  Prior to this visit, she was admitted in 01/2021 for acute on chronic CHF and possible pneumonia.  Echo during that admission showed LVEF of 55-60% with moderate concentric LVH and grade 3 diastolic dysfunction as well as moderately elevated PASP of 56.0 mmHg.  Initial BP in the office was elevated at 200/110; however, it improved to 160/90 after he took his morning medications.  He was started on Valsartan and clonidine was discontinued.  He was referred to Dr. [facility] but does not look like he has been seen yet.  He has had 2 recent admission. He was admitted in 04/2021 for acute streptococcal tonsillitis. Also treated for hypertensive urgency at this time. More recently, he was admitted earlier this month from 05/08/2021 to 05/11/2021 for acute on chronic diastolic CHF.  He was treated with IV Lasix and symptoms improved.  Home ARB was held due to fluctuating renal function (creatinine peaked at 3.35) and he was referred to nephrology as an outpatient.  Patient presented [facility] to [facility] ED on 05/18/2021 shortly after midnight via EMS valuation of acute respiratory failure.  Upon EMS arrival, patient was reportedly tripoding and was diaphoretic  with O2 sats of 74% on room air.  Patient required CPAP in route to the ED.  He was also noted to be markedly hypertensive at 180/100.  BNP was elevated at 760 but improved from recent admission. Chest x-ray showed continued severe airspace disease with cardiomegaly and right pleural effusion.  ABG showed respiratory acidosis mixed picture with saturation in the 80s. He was started on IV Lasix as well as Nicardipine drip for his BP.  Echo showed LVEF of 70 to 75% with normal wall motion, mild LVH, and indeterminate diastolic parameters but elevated LVEDP.  RV was normal but did show moderate pulmonary hypertension with RVSP of 45.2 mmHg.  Creatinine was 2.43 on admission but worsened with IV diuresis and peaked at 4.15 on 05/22/2021.  Nephrology was consulted and patient was started on CRRT on 2/16.  Cardiology consulted for further evaluation of CHF concern for possible cardiac amyloidosis.  At the time of this evaluation, patient is resting comfortably on 2 L of O2 via nasal cannula. He is currently getting CRRT. Patient states when he left the hospital on 05/11/2021, he still had some significant lower extremity edema but he states his breathing was better. Then on the evening of 05/17/2021, he became acutely short of breath which is when EMS was called. He states he had been compliant with all his medications including his Lasix and his antihypertensives. He also reports following a low sodium diet. He denies any chest pain, palpitations, lightheadedness, dizziness, or syncope. He developed a cough with the acute onset of shortness  of breath but denies any other fevers or illnesses. No abnormal bleeding in urine or stools.   Patient has a remote history of tobacco abuse but quit smoking in 2001. He denies any current alcohol or drug use and states he has not used any recreational drugs in about 1 year.  Past Medical History:  Diagnosis Date   Chronic kidney disease    Diabetes mellitus without complication  (HCC)    Elevated serum cholesterol 04/2019   Elevated serum creatinine 04/2019   Hypertension    Hypertensive urgency 04/2019   Seizures ([place]) 03/2017   x 2    Stroke ([place]) 03/2017   Substance abuse ([place])    Vitamin D deficiency 04/2019    History reviewed. No pertinent surgical history.   Home Medications:  Prior to Admission medications   Medication Sig Start Date End Date Taking? Authorizing Provider  albuterol (VENTOLIN HFA) 108 (90 Base) MCG/ACT inhaler Inhale 2 puffs into the lungs every 6 (six) hours as needed for wheezing or shortness of breath. 04/19/19  Yes Azzie Glatter, FNP  amLODipine (NORVASC) 10 MG tablet Take 1 tablet (10 mg total) by mouth daily. 02/21/21 05/18/21 Yes Elodia Florence., MD  aspirin EC 81 MG tablet Take 1 tablet (81 mg total) by mouth daily. 09/13/19  Yes Lavina Hamman, MD  atorvastatin (LIPITOR) 10 MG tablet Take 1 tablet (10 mg total) by mouth daily. 02/21/21 05/18/21 Yes Elodia Florence., MD  carvedilol (COREG) 25 MG tablet Take 1 tablet (25 mg total) by mouth 2 (two) times daily with a meal. 02/21/21 05/18/21 Yes Elodia Florence., MD  furosemide (LASIX) 40 MG tablet Take 1 tablet (40 mg total) by mouth 2 (two) times daily. Patient taking differently: Take 40 mg by mouth daily. 05/11/21 07/10/21 Yes Barb Merino, MD  hydrALAZINE (APRESOLINE) 100 MG tablet Take 1 tablet (100 mg total) by mouth 3 (three) times daily. Patient taking differently: Take 100 mg by mouth 2 (two) times daily. 02/21/21 05/18/21 Yes Elodia Florence., MD  insulin glargine (LANTUS) 100 UNIT/ML injection Inject 5 Units into the skin at bedtime.   Yes [provider]  isosorbide mononitrate (IMDUR) 60 MG 24 hr tablet Take 1 tablet (60 mg total) by mouth daily. 02/21/21 05/18/21 Yes Elodia Florence., MD  metFORMIN (GLUCOPHAGE) 1000 MG tablet Take 1,000 mg by mouth 2 (two) times daily with a meal.   Yes [provider]  valsartan (DIOVAN) 40  MG tablet Take 40 mg by mouth daily.   Yes [provider]  Blood Glucose Monitoring Suppl (TRUE METRIX AIR GLUCOSE METER) DEVI 1 each by Does not apply route 4 (four) times daily -  before meals and at bedtime. 04/19/19   Azzie Glatter, FNP  glipiZIDE (GLUCOTROL XL) 10 MG 24 hr tablet Take 1 tablet (10 mg total) by mouth daily with breakfast. Patient not taking: Reported on 05/18/2021 01/19/21   Caren Griffins, MD  glucose blood (TRUE METRIX BLOOD GLUCOSE TEST) test strip Use as instructed Patient taking differently: 1 each by Other route as directed. 04/19/19   Azzie Glatter, FNP  insulin glargine (LANTUS SOLOSTAR) 100 UNIT/ML Solostar Pen Inject 5 Units into the skin daily at 10 pm. Patient not taking: Reported on 05/18/2021 01/19/21 01/19/22  Caren Griffins, MD  Insulin Pen Needle (PEN NEEDLES) 31G X 6 MM MISC 1 each by Does not apply route at bedtime. Patient not taking: Reported on 02/26/2021  10/05/17   Dorena Dew, FNP  INSULIN SYRINGE .5CC/29G (B-D INS SYR ULTRAFINE .5CC/29G) 29G X 1/2" 0.5 ML MISC 1 each by Does not apply route at bedtime. Patient not taking: Reported on 01/12/2021 04/02/17   Dorena Dew, FNP  Lancets MISC 1 each by Does not apply route 4 (four) times daily -  before meals and at bedtime. Patient not taking: Reported on 01/12/2021 04/19/19   Azzie Glatter, FNP    Inpatient Medications: Scheduled Meds:  carvedilol  25 mg Oral BID WC   Chlorhexidine Gluconate Cloth  6 each Topical Q0600   cloNIDine  0.3 mg Oral TID   feeding supplement (GLUCERNA SHAKE)  237 mL Oral TID BM   heparin  5,000 Units Subcutaneous Q8H   insulin aspart  0-20 Units Subcutaneous TID WC   insulin aspart  0-5 Units Subcutaneous QHS   insulin aspart  4 Units Subcutaneous TID WC   isosorbide mononitrate  120 mg Oral Daily   mouth rinse  15 mL Mouth Rinse BID   minoxidil  5 mg Oral BID   pantoprazole  40 mg Oral QHS   sodium chloride flush  3 mL Intravenous Q12H    Continuous Infusions:   prismasol BGK 4/2.5 400 mL/hr at 05/27/21 0703    prismasol BGK 4/2.5 400 mL/hr at 05/27/21 0703   sodium chloride Stopped (05/24/21 1059)   heparin 10,000 units/ 20 mL infusion syringe 750 Units/hr (05/27/21 0201)   prismasol BGK 4/2.5 1,500 mL/hr at 05/27/21 0750   PRN Meds: sodium chloride, acetaminophen, docusate sodium, heparin, heparin, labetalol, polyethylene glycol, sodium chloride flush  Allergies:   No Known Allergies  Social History:   Social History   Socioeconomic History   Marital status: Single    Spouse name: Not on file   Number of children: Not on file   Years of education: Not on file   Highest education level: Not on file  Occupational History   Not on file  Tobacco Use   Smoking status: Former    Types: Cigarettes    Quit date: 04/25/1999    Years since quitting: 22.1   Smokeless tobacco: Never   Tobacco comments:    2001  Vaping Use   Vaping Use: Never used  Substance and Sexual Activity   Alcohol use: Yes    Comment: occ, none since Dec 2018   Drug use: Yes    Types: Cocaine, Marijuana    Comment: weed-daily. cocaine- once or twice a month, 05/25/17 avg 1/2 gm daily, no cocaine since Dec 2018    Sexual activity: Yes    Birth control/protection: None  Other Topics Concern   Not on file  Social History Narrative   ** Merged History Encounter **   05/25/17 Lives with girlfriend   05/25/17 currently out of work   Education- GED   Children- 3    no caffeine use   Social Determinants of Radio broadcast assistant Strain: Not on file  Food Insecurity: Not on file  Transportation Needs: Not on file  Physical Activity: Not on file  Stress: Not on file  Social Connections: Not on file  Intimate Partner Violence: Not on file    Family History:   Family History  Problem Relation Age of Onset   Diabetes Father    Heart attack Father    Diabetes Sister    Hypertension Brother      ROS:  Please see the history of  present illness.  Review of Systems  Constitutional:  Negative for chills and fever.  HENT:  Negative for congestion.   Respiratory:  Positive for cough, sputum production and shortness of breath. Negative for hemoptysis.   Cardiovascular:  Positive for leg swelling. Negative for chest pain and palpitations.  Gastrointestinal:  Negative for blood in stool and melena.  Genitourinary:  Negative for hematuria.  Musculoskeletal:  Negative for myalgias.  Neurological:  Negative for dizziness and loss of consciousness.  Endo/Heme/Allergies:  Does not bruise/bleed easily.  Psychiatric/Behavioral:  Negative for substance abuse.    Physical Exam/Data:   Vitals:   05/27/21 0600 05/27/21 0700 05/27/21 0800 05/27/21 0808  BP: (!) 137/92 (!) 162/97 (!) 183/111 (!) 183/111  Pulse: (!) 58  70 70  Resp: 11 (!) 22 (!) 23   Temp:   98 F (36.7 C)   TempSrc:   Oral   SpO2: 97%  95%   Weight:      Height:        Intake/Output Summary (Last 24 hours) at 05/27/2021 0857 Last data filed at 05/27/2021 0800 Gross per 24 hour  Intake 1616 ml  Output 7296 ml  Net -5680 ml   Last 3 Weights 05/27/2021 05/26/2021 05/25/2021  Weight (lbs) 243 lb 9.7 oz 251 lb 1.7 oz 265 lb 14 oz  Weight (kg) 110.5 kg 113.9 kg 120.6 kg     Body mass index is 37.04 kg/m.  General: 41 y.o. obese African-American male resting comfortably in no acute distress. On 2L of ))2 via nasal cannula and CRRT. HEENT: Normocephalic and atraumatic. Sclera clear.  Neck: Supple. No JVD. Heart: RRR. Distinct S1 and S2. No murmurs, gallops, or rubs. Radial pulses 2+ and equal bilaterally. Lungs: No increased work of breathing. Decreased breath sounds in bases but no significant wheezes, rhonchi, or rales appreciated. Abdomen: Soft, non-distended, and non-tender to palpation. Bowel sounds present. Extremities: 3+ pitting edema of bilateral lower extremities (right > left). Skin: Warm and dry. Neuro: Alert and oriented x3. No focal  deficits. Psych: Normal affect. Responds appropriately.  EKG:  The EKG was personally reviewed and demonstrates:   Telemetry:  Telemetry was personally reviewed and demonstrates:  Sinus rhythm with rates in the 50s.  Relevant CV Studies:  Lower Extremity Venous Doppler 05/08/2021: Summary: RIGHT:  - There is no evidence of deep vein thrombosis in the lower extremity.  - There is no evidence of superficial venous thrombosis.  - No cystic structure found in the popliteal fossa.  subcutaneous edema noted.     LEFT:  - No evidence of common femoral vein obstruction.  _______________  Echocardiogram 05/18/2021: Impressions: 1. Left ventricular ejection fraction, by estimation, is 70 to 75%. The  left ventricle has hyperdynamic function. The left ventricle has no  regional wall motion abnormalities. There is mild concentric left  ventricular hypertrophy. Left ventricular  diastolic parameters are indeterminate. Elevated left ventricular  end-diastolic pressure. The average left ventricular global longitudinal  strain is -24.8 %. The global longitudinal strain is normal.   2. Right ventricular systolic function is normal. The right ventricular  size is mildly enlarged. There is moderately elevated pulmonary artery  systolic pressure. The estimated right ventricular systolic pressure is  123456 mmHg.   3. The mitral valve is normal in structure. No evidence of mitral valve  regurgitation. No evidence of mitral stenosis.   4. The aortic valve is normal in structure. Aortic valve regurgitation is  not visualized. No aortic stenosis is present.   5. Aortic  dilatation noted. There is mild dilatation of the ascending  aorta, measuring 38 mm.   6. The inferior vena cava is dilated in size with <50% respiratory  variability, suggesting right atrial pressure of 15 mmHg.  _______________  Renal Artery Ultrasound 05/21/2021: Summary: Right: Patent renal artery without significant stenosis.  Abnormal right Resistive Index. Normal size right kidney.  Left:  Patent renal artery without significant stenosis. Abnormal left Resisitve Index. Normal size of left kidney.   Laboratory Data:  High Sensitivity Troponin:   Recent Labs  Lab 05/08/21 1634 05/08/21 1840 05/18/21 0008 05/18/21 0250 05/18/21 0606  TROPONINIHS 24* 23* 26* 48* 68*     Chemistry Recent Labs  Lab 05/25/21 0310 05/25/21 1619 05/26/21 0253 05/26/21 1448 05/27/21 0251  NA 136   < > 135 133* 135  K 4.5   < > 4.6 4.1 4.6  CL 104   < > 104 103 103  CO2 27   < > 27 26 28   GLUCOSE 185*   < > 171* 169* 154*  BUN 53*   < > 41* 38* 35*  CREATININE 1.85*   < > 1.54* 1.62* 1.66*  CALCIUM 8.4*   < > 8.6* 8.1* 8.4*  MG 2.7*  --  2.7*  --  2.7*  GFRNONAA 46*   < > 57* 54* 52*  ANIONGAP 5   < > 4* 4* 4*   < > = values in this interval not displayed.    Recent Labs  Lab 05/26/21 0253 05/26/21 1448 05/27/21 0251  ALBUMIN 2.8* 2.8* 2.7*   Lipids No results for input(s): CHOL, TRIG, HDL, LABVLDL, LDLCALC, CHOLHDL in the last 168 hours.  Hematology Recent Labs  Lab 05/25/21 0310 05/26/21 0253 05/27/21 0251  WBC 13.8* 14.7* 11.2*  RBC 3.54* 3.68* 3.66*  HGB 9.3* 9.5* 9.5*  HCT 29.1* 30.4* 31.2*  MCV 82.2 82.6 85.2  MCH 26.3 25.8* 26.0  MCHC 32.0 31.3 30.4  RDW 17.9* 18.4* 18.6*  PLT 186 175 166   Thyroid No results for input(s): TSH, FREET4 in the last 168 hours.  BNPNo results for input(s): BNP, PROBNP in the last 168 hours.  DDimer No results for input(s): DDIMER in the last 168 hours.   Radiology/Studies:  DG Chest Port 1 View  Result Date: 05/26/2021 CLINICAL DATA:  Acute respiratory failure EXAM: PORTABLE CHEST 1 VIEW COMPARISON:  Radiograph 05/23/2021 FINDINGS: Right neck catheter tip overlies the distal superior vena cava. Left neck catheter is been removed. Unchanged enlarged cardiac silhouette. There are bilateral interstitial and airspace opacities, right greater than left, not  significantly changed from prior. There is a small right pleural effusion, unchanged. No visible pneumothorax. There is no acute osseous abnormality. IMPRESSION: Left neck catheter has been removed.  Unchanged right neck catheter. Unchanged cardiomegaly with right greater left interstitial and airspace disease and small right effusion, favoring multifocal infection over pulmonary edema. Electronically Signed   By: Maurine Simmering M.D.   On: 05/26/2021 08:47   DG Chest Port 1 View  Result Date: 05/23/2021 CLINICAL DATA:  Central line placement EXAM: PORTABLE CHEST 1 VIEW COMPARISON:  Earlier today at 12:24 p.m. FINDINGS: 2:09 p.m. Left internal jugular line tip at high SVC. Placement of a right internal jugular line with tip at mid SVC. Midline trachea. Moderate cardiomegaly. Suspect tiny right pleural effusion. No pneumothorax. Right greater than left interstitial and airspace disease is similar. IMPRESSION: New right-sided central line, terminating at the mid SVC. Similar cardiomegaly with right greater than  left interstitial and airspace disease. Favor infection over pulmonary edema, given appearance on 05/21/2021 CT. Electronically Signed   By: Abigail Miyamoto M.D.   On: 05/23/2021 14:43   DG Chest Port 1 View  Result Date: 05/23/2021 CLINICAL DATA:  Shortness of breath EXAM: PORTABLE CHEST 1 VIEW COMPARISON:  Chest radiograph 1 day prior FINDINGS: Left IJ vascular catheter is in stable position terminating in the region of the distal brachiocephalic vein The cardiac silhouette is enlarged, unchanged. The mediastinal contours are stable. Extensive opacities in the right lung predominantly in the right upper lobe have worsened in the interim. The right costophrenic angle is partially cut off. Aeration of the left lung is unchanged. There is no significant left effusion. There is no pneumothorax. The bones are stable. IMPRESSION: Extensive opacities in the right lung predominantly in the right upper lobe have  worsened in the interim, concerning for worsened infection. Electronically Signed   By: Valetta Mole M.D.   On: 05/23/2021 12:42     Assessment and Plan:   Acute Hypoxic Respiratory Failure Secondary to ARDS Patient presented with acute hypoxic respiratory failure initially requiring BiPAP. Felt to be secondary to ARDS. Elevated procalcitonin, lack of response to diuretics, and air bronchograms on chest x-ray suggest more of an infectious vs inflammatory process. Hydralazine-induced pneumonitis was considered but ruled out with negative anti-histone AB.  Management per PCCM. Volume status is being managed by CRRT. Please see additional recommendations for CHF below.  Restrictive Cardiomyopathy Acute on Chronic Diastolic CHF Patient has known diastolic CHF with grade 3 diastolic dysfunction noted on Echo in 01/2021. BNP was elevated at 760 but improved from recent admission. Repeat this admission showed LVEF of 70 to 75% with normal wall motion, mild LVH, and indeterminate diastolic parameters but elevated LVEDP.  RV was normal but did show moderate pulmonary hypertension with RVSP of 45.2 mmHg. He was initially started on IV Lasix but had worsening renal function with this. Nephrology on board and patient now on CRRT. - Patient still has significant lower extremity edema on exam. Also has decreased breath sounds on exam. - Volume status being managed with CRRT. Management per Nephrology. - May need right heart cath this admission depending on respiratory symptoms with continued CRRT.  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  - Patient will need outpatient Hematology referral.  Moderate Pulmonary Hypertension Noted on Echo. Likely secondary to restrictive cardiomyopathy and suspected untreated sleep apnea.  - Continue volume management as above. - Patient does report  snoring at home but has never been tested for sleep apnea. Recommend outpatient sleep study.  Hypertensive Urgency Patient initially on Nicardipine drip but this was able to be stopped. PO medications have been adjusted: Hydralazine stopped due to concern for pneumonitis, Amlodipine was changed to Minoxidil on 2/19, and Clonidine was increased. BP still elevated at times overall much improved over the last 24 hours. Renal artery ultrasound showed no significant renal artery stenosis. - Continue Coreg 25mg  twice daily. - Continue Imdur 120mg  daily. - Continue Minoxidil 5mg  twice daily (started yesterday). - Clonidine has been increased to 0.3mg  three times daily today. - No ACE/ARB/ARNI or MRA given renal function. - If BP remains uncontrolled, can consider adding back Amlodipine. - Recommend outpatient sleep study.  Acute on CKD Stage III-IV Creatinine 2.43 on admission and then peaked to 4.15 on 05/22/2021. Renal artery ultrasound this admission showed no significant  renal artery stenosis. - Creatinine 1.66 today. - Currently on CRRT. - Management per Nephrology.  Otherwise, per primary team.   Risk Assessment/Risk Scores:   New York Heart Association (NYHA) Functional Class NYHA Class III-IV   For questions or updates, please contact [place] Please consult [email or web address] for contact info under    Signed, Darreld Mclean, PA-C  05/27/2021 8:57 AM  Personally seen and examined. Agree with APP above with the following comments:  Briefly 41 yo M with a history of HTN DM, HFpEF-restrictive cardiomyopathy and Type II PH, morbid obesity, substance abuse in remission and renal dysfunction and dialysis started this admission who presents with un-remitting HT   Patient notes that he did not have medical problems until 2019.  Has noted HTN but now other issues.   He does not remember the HTN clinic and I suspect he may not have been able to make his appointment.   Patient has had  prolonged elevated BP, at prior admissions he has improved with HD.  He had started aggressive PO BP regimen and started CVVHD with significant volume removal; still appears hypervolemic  Exam notable for No tachypnea, but decreased breath sounds.  Morbid obesity.  +3 bilateral pitting edema, sinus bradycardia.  He has preserved LVEF, concentric severe LVH, and moderately elevated RSVP. Elevated K/L ratio  Would recommend  - next antihypertensive may be amlodipine (he was on prior) despite LE swelling as we have limited options left. - He has a K/L free change ration greater than 1.65 with LVH, and restricted cardiomyopathy: outpatient hematology referral and discussed for biopsy with mass spec or congo red staining is reasonable; I suspect that his HTN had lead to his cardiomyopathy and present Cardiac AL therapy would be to slow the progression of disease - he is still significantly hypervolemic; as he improves if there is still question of intravascular volume status RHC would be reasonable; he has a degree of Type II PH from his restrictive disease and presumed OSA  Will see tomorrow for assistance with BP and volume; based on questions around volume status will discuss further care.  Rudean Haskell, MD [place], [address] [place], [place] [address] [phone]  10:45 AM

## 2021-05-28 DIAGNOSIS — J9601 Acute respiratory failure with hypoxia: Secondary | ICD-10-CM | POA: Diagnosis not present

## 2021-05-28 LAB — MAGNESIUM: Magnesium: 2.6 mg/dL — ABNORMAL HIGH (ref 1.7–2.4)

## 2021-05-28 LAB — RENAL FUNCTION PANEL
Albumin: 2.7 g/dL — ABNORMAL LOW (ref 3.5–5.0)
Anion gap: 3 — ABNORMAL LOW (ref 5–15)
BUN: 34 mg/dL — ABNORMAL HIGH (ref 6–20)
CO2: 27 mmol/L (ref 22–32)
Calcium: 8.2 mg/dL — ABNORMAL LOW (ref 8.9–10.3)
Chloride: 103 mmol/L (ref 98–111)
Creatinine, Ser: 1.76 mg/dL — ABNORMAL HIGH (ref 0.61–1.24)
GFR, Estimated: 49 mL/min — ABNORMAL LOW (ref >60)
Glucose, Bld: 147 mg/dL — ABNORMAL HIGH (ref 70–99)
Phosphorus: 2.2 mg/dL — ABNORMAL LOW (ref 2.5–4.6)
Potassium: 4.6 mmol/L (ref 3.5–5.1)
Sodium: 133 mmol/L — ABNORMAL LOW (ref 135–145)

## 2021-05-28 LAB — GLUCOSE, CAPILLARY
Glucose-Capillary: 128 mg/dL — ABNORMAL HIGH (ref 70–99)
Glucose-Capillary: 155 mg/dL — ABNORMAL HIGH (ref 70–99)
Glucose-Capillary: 157 mg/dL — ABNORMAL HIGH (ref 70–99)
Glucose-Capillary: 172 mg/dL — ABNORMAL HIGH (ref 70–99)

## 2021-05-28 LAB — CBC
HCT: 31.9 % — ABNORMAL LOW (ref 39.0–52.0)
Hemoglobin: 9.6 g/dL — ABNORMAL LOW (ref 13.0–17.0)
MCH: 25.9 pg — ABNORMAL LOW (ref 26.0–34.0)
MCHC: 30.1 g/dL (ref 30.0–36.0)
MCV: 86.2 fL (ref 80.0–100.0)
Platelets: 173 10*3/uL (ref 150–400)
RBC: 3.7 MIL/uL — ABNORMAL LOW (ref 4.22–5.81)
RDW: 18.6 % — ABNORMAL HIGH (ref 11.5–15.5)
WBC: 9 10*3/uL (ref 4.0–10.5)
nRBC: 0 % (ref 0.0–0.2)

## 2021-05-28 LAB — APTT: aPTT: 39 seconds — ABNORMAL HIGH (ref 24–36)

## 2021-05-28 MED ORDER — SODIUM PHOSPHATES 45 MMOLE/15ML IV SOLN
30.0000 mmol | Freq: Once | INTRAVENOUS | Status: AC
  Administered 2021-05-28: 30 mmol via INTRAVENOUS
  Filled 2021-05-28: qty 10

## 2021-05-28 MED ORDER — ENSURE ENLIVE PO LIQD
237.0000 mL | Freq: Two times a day (BID) | ORAL | Status: DC
  Administered 2021-05-28 – 2021-06-07 (×20): 237 mL via ORAL

## 2021-05-28 MED ORDER — HYDRALAZINE HCL 50 MG PO TABS
50.0000 mg | ORAL_TABLET | Freq: Three times a day (TID) | ORAL | Status: DC
  Administered 2021-05-29 – 2021-06-02 (×12): 50 mg via ORAL
  Filled 2021-05-28 (×13): qty 1

## 2021-05-28 MED ORDER — B COMPLEX-C PO TABS
1.0000 | ORAL_TABLET | Freq: Every day | ORAL | Status: DC
  Administered 2021-05-28 – 2021-06-08 (×12): 1 via ORAL
  Filled 2021-05-28 (×12): qty 1

## 2021-05-28 NOTE — Progress Notes (Addendum)
NAME:  Micheal Levy, MRN:  [id number], DOB:  Feb 01, 1981, LOS: 70 ADMISSION DATE:  05/18/2021, CONSULTATION DATE: 05/18/2021 REFERRING MD:  Dr. Sedonia Small - EDP CHIEF COMPLAINT:  Acute respiratory failure   History of Present Illness:  41 year old man who presented to [facility] ED 2/11 for respiratory distress. PMHx significant for HTN, HLD, Z3GU, chronic diastolic heart failure (TTE 01/2021 with EF 44-03%, grade 3 diastolic dysfunction), CVA, CKD stage IIIa (baseline Cr ~3.0), substance abuse. Recently hospitalized 2/1-2/4 for acute-on-chronic diastolic heart failure and acute-on-chronic kidney injury presenting with dyspnea, PND, worsening pedal edema and elevated BNP. Renal US was WNL.  He was treated with Lasix and discharged home.   Returned 2/11 with worsening respiratory distress. Initially treated as asthma exacerbation with epinephrine by EMS, but found in the ER to be hypertensive with SBP 180-200s, DBP 100s. ABG with respiratory acidosis; mixed picture with sats 80s.  CXR concerning for pulmonary edema with significant crackles on exam. Started on NTG gtt, transitioned to nicardipine gtt for goal SBP < 150.  Lasix 63m x 1 given.    PCCM was consulted for admission in the setting of respiratory distress, hypertensive urgency.   Pertinent Medical History:  DM  CKD  Substance Abuse  Seizures  Stroke  HTN  Vitamin D Deficiency   Significant Hospital Events:  2/11 Admit  2/13 On BiPAP 18/6, 60% fiO2 2/14 L IJ CVC placed 2/15 Downtitrating Cardene, uptitrating PO antihypertensives. Respiratory status stable on HFNC 15L. Slightly improved but still marginal UOP with aggressive diuresis. 2/16 Continued improvement in O2 needs. Remains hypertensive off of Cardene (stopped given significant fluid burden). HD/CRRT initiation to optimize for RHC. RIJ Trialysis catheter placed. 2/17 CRRT initiated with 3.2L removed (net -4.2L). [place] 10L, tolerating well.  2/19 Subjectively feeling better, CXR not  changed, elevated BP despite multiple agents, on CVVH 2/20 2L [place], neg 5.9L, feeling better. K/L ratio increased   Interim History / Subjective:  Afebrile  On 1L O2  I/O 350 ml UOP, 6.8L UF for net neg 4.4L in last 24 hours Patient reports feeling better overall, notes swelling improved but not resolved  BP control improved  Glucose range 128-169   Objective:   Blood pressure (!) 151/84, pulse 76, temperature 98.9 F (37.2 C), temperature source Oral, resp. rate 18, height _0  (1.727 m), weight 107.5 kg, SpO2 97 %.    FiO2 (%):  [40 %] 40 %   Intake/Output Summary (Last 24 hours) at 05/28/2021 0857 Last data filed at 05/28/2021 0800 Gross per 24 hour  Intake 2602 ml  Output 7388 ml  Net -4786 ml   Filed Weights   05/26/21 0500 05/27/21 0500 05/28/21 0500  Weight: 113.9 kg 110.5 kg 107.5 kg   Physical Exam: General: adult male sitting up in bed in NAD HEENT: MM pink/moist, good dentition, anicteric, [place] O2 Neuro: AAOx4, speech clear, MAE CV: s1s2 RRR, no m/r/g PULM: non-labored at rest, lungs bilaterally clear anterior, no wheezing  GI: soft, bsx4 active  Extremities: warm/dry, BLE 3+ pre-tibial pitting edema  Skin: no rashes or lesions  Resolved Hospital Problem List:    Assessment & Plan:   Acute hypoxemic respiratory failure due to bilateral infiltrates with ARDS Pulmonary Hypertension (by ECHO) Nodular infiltrates on CXR/CT, not consistent with typical pulmonary edema pattern. Initially concerned with elevated PCT, lack of response to diuretics that it could be an infectious vs inflammatory process. Hydralazine induced pneumonitis ruled out with negative anti-histone antibody. HIV negative. He has had similar  nodular infiltrates dating back to 01/2021. He continues to have residual infiltrates on plain film despite significant volume reduction. Autoimmune panel largely negative except elevated K/L light chains (ESR 56, ANA neg, RF neg, ANCA neg, UDS neg, RVP neg,  protein/cr ratio 1.39 > 0.40, histone ab neg, kappa 109.4, lambda 40.6, ratio 2.69).  -wean O2 for sats >90% -follow intermittent CXR -consider repeat CT imaging once "dry" to evaluate parenchyma / nodular infiltrates  -will need RHC at some point to evaluate pulmonary pressures  -fluid removal with CRRT  Hypertensive Urgency History of CVA, HTN -coreg 66m BID, imdur 120 mg QD, clonidine 0.355mTID -amlodipine changed to minoxidil 2/19 -appreciate Cardiology input  -will need RHC, cardiac imaging at some point   Baseline Restrictive Cardiomyopathy Echo here unchanged, maybe more vigorous LV function. Elevated Kappa/Lamda light chains, ? concern for amyloidosis  -appreciate Cardiology evaluation -will need outpatient Hematology evaluation > will discuss with them re: next steps, bone marrow + abd fat pat biopsy need.    DM2 -SSI, resistant scale  -HS coverage  -meal coverage with 4 units TID   Anemia  Suspect component of illness + loss with filter changes (at least once on 2/20) -trend CBC -transfuse for Hgb <7% or active bleeding   AKI on CKD Hypophosphatemia  Prior Renal Artery USKorean 01/2021 with normal kidneys, 1-59% stenosis of right / left renal arteries. VAS US[place]oreaenal Artery negative for stenosis, abnormal RIs. -appreciate Nephrology input  -CRRT initiated 2/16, may be nearing point of stopping  -Trend BMP / urinary output -Replace electrolytes as indicated, NaPhos 2/21  -Avoid nephrotoxic agents, ensure adequate renal perfusion  At Risk Malnutrition  -ensure BID     Best Practice: (right click and "Reselect all SmartList Selections" daily)  Diet/type: Regular consistency (see orders) DVT prophylaxis: prophylactic heparin  GI prophylaxis: PPI Central venous access:  Yes, and it is still needed Foley:  N/A Code Status:  full code       BrNoe GensMSN, APRN, NP-C, AGACNP-BC [facility] 05/28/2021, 8:57 AM   Please see [email or web address] for  pager details.   From 7A-7P if no response, please call [phone] After hours, please call ELink [phone]

## 2021-05-28 NOTE — Progress Notes (Signed)
CRRT stopped per order, blood effectively returned to patient with no complications.

## 2021-05-28 NOTE — Plan of Care (Signed)

## 2021-05-28 NOTE — Progress Notes (Addendum)
Progress Note  Patient Name: Micheal Levy Date of Encounter: 05/28/2021  [facility] Cardiologist: Godfrey Pick [name], DO   Subjective   No acute overnight events. Patient states breathing is better. Still on 2L of O2 via nasal cannula. He denies any chest pain. He still has significant lower extremity edema but improved from admission.  Inpatient Medications    Scheduled Meds:  carvedilol  25 mg Oral BID WC   Chlorhexidine Gluconate Cloth  6 each Topical Q0600   cloNIDine  0.3 mg Oral TID   feeding supplement (GLUCERNA SHAKE)  237 mL Oral TID BM   heparin  5,000 Units Subcutaneous Q8H   insulin aspart  0-20 Units Subcutaneous TID WC   insulin aspart  0-5 Units Subcutaneous QHS   insulin aspart  4 Units Subcutaneous TID WC   isosorbide mononitrate  120 mg Oral Daily   mouth rinse  15 mL Mouth Rinse BID   minoxidil  5 mg Oral BID   pantoprazole  40 mg Oral QHS   sodium chloride flush  3 mL Intravenous Q12H   Continuous Infusions:   prismasol BGK 4/2.5 400 mL/hr at 05/27/21 2011    prismasol BGK 4/2.5 400 mL/hr at 05/27/21 2013   sodium chloride Stopped (05/24/21 2225)   heparin 10,000 units/ 20 mL infusion syringe 750 Units/hr (05/28/21 0416)   prismasol BGK 4/2.5 1,500 mL/hr at 05/28/21 0443   PRN Meds: sodium chloride, acetaminophen, docusate sodium, heparin, heparin, labetalol, polyethylene glycol, sodium chloride flush   Vital Signs    Vitals:   05/28/21 0315 05/28/21 0400 05/28/21 0416 05/28/21 0500  BP:  122/65  137/70  Pulse:  66  68  Resp:  11  20  Temp:   97.9 F (36.6 C)   TempSrc:   Oral   SpO2: 99% 95%  95%  Weight:    107.5 kg  Height:        Intake/Output Summary (Last 24 hours) at 05/28/2021 0617 Last data filed at 05/28/2021 0500 Gross per 24 hour  Intake 2802 ml  Output 7232 ml  Net -4430 ml   Last 3 Weights 05/28/2021 05/27/2021 05/26/2021  Weight (lbs) 236 lb 15.9 oz 243 lb 9.7 oz 251 lb 1.7 oz  Weight (kg) 107.5 kg 110.5 kg 113.9 kg       Telemetry    Normal sinus rhythm with rates in the 60s to 70s. - Personally Reviewed  ECG    No new ECG tracing today. - Personally Reviewed  Physical Exam   GEN: Obese African-American male resting comfortably in no acute distress. On 2L of O2 via nasal cannula. Also on CRRT. Neck: No JVD. Cardiac: RRR. Possible soft murmur. No rubs or gallops. Respiratory: No increased work of breathing. Decreased breath sounds in bases (right > left) but no significant wheezes, rhonchi, or rales. GI: Soft, non-distended, and non-tender. MS: 2-3+ pitting edema of bilateral lower extremities (right > left). No deformity. Skin: Warm and dry. Neuro:  No focal deficits. Psych: Normal affect. Responds appropriately.  Labs    High Sensitivity Troponin:   Recent Labs  Lab 05/08/21 1634 05/08/21 1840 05/18/21 0008 05/18/21 0250 05/18/21 0606  TROPONINIHS 24* 23* 26* 48* 68*     Chemistry Recent Labs  Lab 05/26/21 0253 05/26/21 1448 05/27/21 0251 05/27/21 2014 05/28/21 0254  NA 135   < > 135 134* 133*  K 4.6   < > 4.6 4.6 4.6  CL 104   < > 103 103 103  CO2 27   < > 28 27 27   GLUCOSE 171*   < > 154* 143* 147*  BUN 41*   < > 35* 33* 34*  CREATININE 1.54*   < > 1.66* 1.62* 1.76*  CALCIUM 8.6*   < > 8.4* 8.0* 8.2*  MG 2.7*  --  2.7*  --  2.6*  ALBUMIN 2.8*   < > 2.7* 2.8* 2.7*  GFRNONAA 57*   < > 52* 54* 49*  ANIONGAP 4*   < > 4* 4* 3*   < > = values in this interval not displayed.    Lipids No results for input(s): CHOL, TRIG, HDL, LABVLDL, LDLCALC, CHOLHDL in the last 168 hours.  Hematology Recent Labs  Lab 05/26/21 0253 05/27/21 0251 05/28/21 0254  WBC 14.7* 11.2* 9.0  RBC 3.68* 3.66* 3.70*  HGB 9.5* 9.5* 9.6*  HCT 30.4* 31.2* 31.9*  MCV 82.6 85.2 86.2  MCH 25.8* 26.0 25.9*  MCHC 31.3 30.4 30.1  RDW 18.4* 18.6* 18.6*  PLT 175 166 173   Thyroid No results for input(s): TSH, FREET4 in the last 168 hours.  BNPNo results for input(s): BNP, PROBNP in the last 168 hours.   DDimer No results for input(s): DDIMER in the last 168 hours.   Radiology    No results found.  Cardiac Studies   Lower Extremity Venous Doppler 05/08/2021: Summary: RIGHT:  - There is no evidence of deep vein thrombosis in the lower extremity.  - There is no evidence of superficial venous thrombosis.  - No cystic structure found in the popliteal fossa.  subcutaneous edema noted.     LEFT:  - No evidence of common femoral vein obstruction.  _______________   Echocardiogram 05/18/2021: Impressions: 1. Left ventricular ejection fraction, by estimation, is 70 to 75%. The  left ventricle has hyperdynamic function. The left ventricle has no  regional wall motion abnormalities. There is mild concentric left  ventricular hypertrophy. Left ventricular  diastolic parameters are indeterminate. Elevated left ventricular  end-diastolic pressure. The average left ventricular global longitudinal  strain is -24.8 %. The global longitudinal strain is normal.   2. Right ventricular systolic function is normal. The right ventricular  size is mildly enlarged. There is moderately elevated pulmonary artery  systolic pressure. The estimated right ventricular systolic pressure is  123456 mmHg.   3. The mitral valve is normal in structure. No evidence of mitral valve  regurgitation. No evidence of mitral stenosis.   4. The aortic valve is normal in structure. Aortic valve regurgitation is  not visualized. No aortic stenosis is present.   5. Aortic dilatation noted. There is mild dilatation of the ascending  aorta, measuring 38 mm.   6. The inferior vena cava is dilated in size with <50% respiratory  variability, suggesting right atrial pressure of 15 mmHg.  _______________  Patient Profile     41 y.o. male with a history of diastolic CHF, hypertension, type 2 diabetes mellitus, CKD stage III-IV , stroke, seizure disorder, and substance abuse in remission who is being seen 05/27/2021 for the  evaluation of CHF at the request of Dr. Valeta Harms.    Assessment & Plan    Acute Hypoxic Respiratory Failure Secondary to ARDS Patient presented with acute hypoxic respiratory failure initially requiring BiPAP. Felt to be secondary to ARDS. Elevated procalcitonin, lack of response to diuretics, and air bronchograms on chest x-ray suggest more of an infectious vs inflammatory process. Hydralazine-induced pneumonitis was considered but ruled out with negative anti-histone AB.  Management per PCCM. Volume status is being managed by CRRT. Please see additional recommendations for CHF below.   Restrictive Cardiomyopathy Acute on Chronic Diastolic CHF Patient has known diastolic CHF with grade 3 diastolic dysfunction noted on Echo in 01/2021. BNP was elevated at 760 but improved from recent admission. Repeat this admission showed LVEF of 70 to 75% with normal wall motion, mild LVH, and indeterminate diastolic parameters but elevated LVEDP.  RV was normal but did show moderate pulmonary hypertension with RVSP of 45.2 mmHg. He was initially started on IV Lasix but had worsening renal function with this. Nephrology on board and patient now on CRRT. Weight down from 265 lbs on admission to 237 lbs today. - Patient still has significant lower extremity edema on exam. Also has decreased breath sounds on exam.  - Volume status being managed with CRRT. Management per Nephrology. - Suspect he may need right heart catheterization this admission, especially as there is still questions about intravascular volume status. Per RN, Nephrology OK with RHC whenever. Will discuss with MD.   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  - Patient will need outpatient Hematology referral.   Moderate Pulmonary Hypertension Noted on Echo. Likely secondary to restrictive cardiomyopathy and suspected untreated  sleep apnea.  - Continue volume management as above. - Patient does report snoring at home but has never been tested for sleep apnea. Recommend outpatient sleep study.   Hypertensive Urgency Patient initially on Nicardipine drip but this was able to be stopped. PO medications have been adjusted: Hydralazine stopped due to concern for pneumonitis, Amlodipine was changed to Minoxidil on 2/19, and Clonidine was increased. Renal artery ultrasound showed no significant renal artery stenosis. - BP much better controlled over the last 24 hours.Systolic BP mostly in the 120 to 130s yesterday afternoon. Most recent BP 151/84 but this was before any medications had been given. - Continue Coreg 25mg  twice daily. - Continue Imdur 120mg  daily. - Continue Minoxidil 5mg  twice daily. - Continue Clonidine 0.3mg  three times daily today. - No ACE/ARB/ARNI or MRA given renal function. - Recommend outpatient sleep study.   Acute on CKD Stage III-IV Creatinine 2.43 on admission and then peaked to 4.15 on 05/22/2021. Renal artery ultrasound this admission showed no significant renal artery stenosis. - Creatinine 1.76 today. - On CRRT. Pulling about 5-6 L per day here. Unclear what long term plan is. - Management per Nephrology.   Otherwise, per primary team.  For questions or updates, please contact [place] Please consult [email or web address] for contact info under        Signed, Darreld Mclean, PA-C  05/28/2021, 6:17 AM    Personally seen and examined. Agree with APP above with the following comments: Briefly 23 go male with previously uncontrolled HTN who presents with elevated K and L chains, restrictive cariomyopathy and renal failure. Patient notes that he feels the best he has in a long time.  Wife notes that this is the skinniest he has been in a while.  Exam notable for bilateral edema (improved).  He has an S3 but no tachypnea.  He is off O2 today. (Seen in PM).  Would recommend  - we have  discussed outpatient hematology eval for the evaluation of AL amyloid, thought highest clinical suspicion is uncontrolled HTN as his etiology for HFpEF/restrictive cardiomyopathy.  If there is clinical equipoise during heme evaluation outpatient CMR may be considered (cyclic  GAD) - when clinically euvolemic, we have discussed evaluation of R heart pressures with a RHC; he is planned for [facility] transfer for iHD. (Consented but orders not placed) - we have had limited medication to treat his BP; now that there is less concern for hydralazine related pneumonitis; if the minoxidil is thought to be contributing to his LE edema, we can switch to 5 mg daily and restart hydralazine - ultimately, I suspect the BP and proteinuria and the larges culprits of his decompensation (improving on therapy) - will check in with nephrology and CC; potential RHC at end of this week  Rudean Haskell, MD [facility]  [address] [place], [place] [address] [phone]  1:23 PM

## 2021-05-28 NOTE — Progress Notes (Addendum)
Initial Nutrition Assessment  DOCUMENTATION CODES:   Obesity unspecified  INTERVENTION:  - will d/c Glucerna Shake. - will order Ensure Enlive BID, each supplement provides 350 kcal and 20 grams of protein. - will order vitamin B complex with vitamin C. - will monitor for need for diet education prior to d/c.   NUTRITION DIAGNOSIS:   Increased nutrient needs related to acute illness as evidenced by estimated needs.  GOAL:   Patient will meet greater than or equal to 90% of their needs  MONITOR:   PO intake, Supplement acceptance, Labs, Weight trends, I & O's  REASON FOR ASSESSMENT:   LOS  ASSESSMENT:   41 year old male with medical history of CHF with EF of 0000000, grade 3 diastolic dysfunction on transthoracic echo 01/2021, CVA, HTN, HLD, stage 3 CKD, type 2 DM on Lantus, and hx of substance abuse. He was hospitalized 2/1-2/4 due to acute on chronic CHF and acute on chronic kidney injury in the setting of HTN. He returned to the ED on 2/10 due to dyspnea, worsening pedal edema, and elevated BNP. In the ED he was noted to be hypertensive, ABG showed respiratory acidosis, and he was admitted for acute respiratory failure.  Significant Events:  2/11- admission 2/16- CRRT initiation  Patient laying in bed with male significant other at bedside. Patient on a Heart Healthy diet 2/12-2/14 when diet changed to Heart Healthy/Carb Modified. Review of flow sheet indicates he ate 75-100% at all meals from breakfast on 2/13-dinner on 2/16; no meal completion percentages documented since that time.  CRRT was stopped earlier this afternoon. No plan for resuming at this time. Nephrology note from earlier today indicates 4.1 L pulled off with CRRT on 2/20.  Cardiology following and note today indicates likely need for R heart cath this admission.   Patient reports good appetite and that he ate 100% at meals today without difficulty. Glucerna Shake was added yesterday but patient prefers  chocolate flavored protein shakes.   Noted several snacks on bedside table.   Weight on 2/11 was 265 lb and weight today is 237 lb. Reviewed weights from PTA and most recent was 227 lb on 04/22/21. This may possibly be closer to a dry weight (-10 lb compared to current weight). He is noted to be -22.1 L since admission.    Labs reviewed; CBGs: 128 and 155 mg/dl, Na: 133 mmol/l, BUN: 34 mg/dl, creatinine: 1.76 mg/dl, Ca: 8.2 mg/dl, Phos: 2.2 mg/dl, Mg: 2.6 mg/dl, GFR: 49 ml/min.   Medications reviewed; sliding scale novolog, 4 units novolog TID, 40 mg oral protonix/day, 30 mmol IV NaPhos x1 run 2/21.    NUTRITION - FOCUSED PHYSICAL EXAM:  Flowsheet Row Most Recent Value  Orbital Region No depletion  Upper Arm Region No depletion  Thoracic and Lumbar Region Unable to assess  Buccal Region No depletion  Temple Region No depletion  Clavicle Bone Region No depletion  Clavicle and Acromion Bone Region No depletion  Scapular Bone Region No depletion  Dorsal Hand No depletion  Patellar Region No depletion  Anterior Thigh Region No depletion  Posterior Calf Region No depletion  Edema (RD Assessment) Severe  [BLE]  Hair Reviewed  Eyes Reviewed  Mouth Reviewed  Skin Reviewed  Nails Reviewed       Diet Order:   Diet Order             Diet heart healthy/carb modified Room service appropriate? Yes; Fluid consistency: Thin  Diet effective now  EDUCATION NEEDS:   No education needs have been identified at this time  Skin:  Skin Assessment: Reviewed RN Assessment  Last BM:  2/21 (type 4, large amount)  Height:   Ht Readings from Last 1 Encounters:  05/18/21 5\' 8"  (1.727 m)    Weight:   Wt Readings from Last 1 Encounters:  05/28/21 107.5 kg     BMI:  Body mass index is 36.03 kg/m.  Estimated Nutritional Needs:  Kcal:  2300-2500 kcal Protein:  115-130 grams Fluid:  >/= 1.5 L/day     Jarome Matin, MS, RD, LDN Inpatient Clinical  Dietitian RD pager # available in [place]  After hours/weekend pager # available in [facility]

## 2021-05-28 NOTE — Progress Notes (Signed)
Reviewed case with hematology. Could be some degree of poor clearance of light chains with renal failure.  May need further biopsy at some point if other disease processes ruled out.    Will send myeloma panel, 24 hour urine for completeness.     Canary Brim, MSN, APRN, NP-C, AGACNP-BC [facility] 05/28/2021, 5:44 PM   Please see [email or web address] for pager details.   From 7A-7P if no response, please call [phone] After hours, please call ELink [phone]

## 2021-05-28 NOTE — Plan of Care (Signed)
  Problem: Education: Goal: Knowledge of General Education information will improve Description Including pain rating scale, medication(s)/side effects and non-pharmacologic comfort measures Outcome: Progressing   Problem: Activity: Goal: Risk for activity intolerance will decrease Outcome: Progressing   Problem: Safety: Goal: Ability to remain free from injury will improve Outcome: Progressing   

## 2021-05-28 NOTE — Progress Notes (Signed)
[facility] Progress Note  Subjective: seen in room, net neg 4.1 L yesterday. BP's are normal to dropping low this am.  Weaned off O2 this am.   Vitals:   05/28/21 0800 05/28/21 0801 05/28/21 0900 05/28/21 1000  BP: (!) 151/84 (!) 151/84 118/64 129/70  Pulse:  76 73 76  Resp: 18  16 17   Temp:      TempSrc:      SpO2: 97%  94% 98%  Weight:      Height:        Exam: Gen:nad, sitting up in bed, in good spirits CVS:rrr, s1s2 Resp: normal WOB [id number], soft Ext: 1+ pitting edema b/l le's Neuro: awake, alert, speech clear and coherent, moves all ext spontaneously Dialysis access: RIJ trialysis catheter     Summary: 41 yo w/ hx of HTN, DM, CKD b/l creat 1.8- 3 admitted for resp distress. Had recent admit for decomp diast CHF and AKI/ CKD from 2/1- 2/4 rx'd w/ diuresis then dc'd home. Returned on 2/11 w/ resp distress, uncont HTN and hypoxemia. CXR showed pulm edema, started on IV cardene and IV lasix. Poor response to IV lasix so started on CRRT 2/16 for volume reduction. Needs RHC at [facility] when stable.    Assessment/ Plan: AKI on CKD 3 - b/l creat 1.8- 3. Creat here 2.4 on admit, peaked at 3.9 and CRRT started 2/16. Suspect cardiorenal cause of AKI. Has some proteinuria quantitated to 400mg  - 1300mg  per day approximately, this is non-nephrotic proteinuria. Started CRRT 2/16 for massive vol overload. Now is off O2 and CXR looks better and exam has improved significantly. Will dc CRRT today. Please transfer to [facility] for iHD.   Vol overload - edema, CXR's better and down 15- 20kg w/ CRRT. As above.  AHRF - d/t ARDS vs poss PNA vs pulm edema. Consider repeat noncon imaging.  HTN urgency - sp Cardene gtt initially, on coreg and clonidine now by mouth.  Anemia - normal mcv, tx prn Hb <7 DM 2     Rob Verlie Liotta 05/28/2021, 10:18 AM   Recent Labs  Lab 05/27/21 0251 05/27/21 2014 05/28/21 0254  K 4.6 4.6 4.6  BUN 35* 33* 34*  CREATININE 1.66* 1.62* 1.76*  ALBUMIN 2.7*  2.8* 2.7*  CALCIUM 8.4* 8.0* 8.2*  PHOS 2.3* 2.2* 2.2*  HGB 9.5*  --  9.6*    Inpatient medications:  carvedilol  25 mg Oral BID WC   Chlorhexidine Gluconate Cloth  6 each Topical Q0600   cloNIDine  0.3 mg Oral TID   feeding supplement (GLUCERNA SHAKE)  237 mL Oral TID BM   heparin  5,000 Units Subcutaneous Q8H   insulin aspart  0-20 Units Subcutaneous TID WC   insulin aspart  0-5 Units Subcutaneous QHS   insulin aspart  4 Units Subcutaneous TID WC   isosorbide mononitrate  120 mg Oral Daily   mouth rinse  15 mL Mouth Rinse BID   minoxidil  5 mg Oral BID   pantoprazole  40 mg Oral QHS   sodium chloride flush  3 mL Intravenous Q12H     prismasol BGK 4/2.5 400 mL/hr at 05/28/21 0856    prismasol BGK 4/2.5 400 mL/hr at 05/28/21 0858   sodium chloride Stopped (05/24/21 2225)   heparin 10,000 units/ 20 mL infusion syringe 750 Units/hr (05/28/21 0800)   prismasol BGK 4/2.5 1,500 mL/hr at 05/28/21 0736   sodium chloride, acetaminophen, docusate sodium, heparin, heparin, labetalol, polyethylene glycol, sodium chloride flush

## 2021-05-29 DIAGNOSIS — D649 Anemia, unspecified: Secondary | ICD-10-CM

## 2021-05-29 DIAGNOSIS — I5033 Acute on chronic diastolic (congestive) heart failure: Secondary | ICD-10-CM | POA: Diagnosis not present

## 2021-05-29 DIAGNOSIS — N179 Acute kidney failure, unspecified: Secondary | ICD-10-CM | POA: Diagnosis not present

## 2021-05-29 DIAGNOSIS — E1165 Type 2 diabetes mellitus with hyperglycemia: Secondary | ICD-10-CM

## 2021-05-29 DIAGNOSIS — E669 Obesity, unspecified: Secondary | ICD-10-CM

## 2021-05-29 DIAGNOSIS — I160 Hypertensive urgency: Secondary | ICD-10-CM | POA: Diagnosis not present

## 2021-05-29 DIAGNOSIS — I272 Pulmonary hypertension, unspecified: Secondary | ICD-10-CM

## 2021-05-29 DIAGNOSIS — I425 Other restrictive cardiomyopathy: Secondary | ICD-10-CM

## 2021-05-29 DIAGNOSIS — N189 Chronic kidney disease, unspecified: Secondary | ICD-10-CM

## 2021-05-29 DIAGNOSIS — J9601 Acute respiratory failure with hypoxia: Secondary | ICD-10-CM | POA: Diagnosis not present

## 2021-05-29 LAB — CREATININE CLEARANCE, URINE, 24 HOUR
Collection Interval-CRCL: 24 hours
Creatinine Clearance: 37 mL/min — ABNORMAL LOW (ref 75–125)
Creatinine, 24H Ur: 1249 mg/d (ref 800–2000)
Creatinine, Urine: 138.76 mg/dL
Urine Total Volume-CRCL: 900 mL

## 2021-05-29 LAB — RENAL FUNCTION PANEL
Albumin: 2.5 g/dL — ABNORMAL LOW (ref 3.5–5.0)
Anion gap: 7 (ref 5–15)
BUN: 38 mg/dL — ABNORMAL HIGH (ref 6–20)
CO2: 28 mmol/L (ref 22–32)
Calcium: 8.5 mg/dL — ABNORMAL LOW (ref 8.9–10.3)
Chloride: 100 mmol/L (ref 98–111)
Creatinine, Ser: 2.32 mg/dL — ABNORMAL HIGH (ref 0.61–1.24)
GFR, Estimated: 35 mL/min — ABNORMAL LOW (ref >60)
Glucose, Bld: 145 mg/dL — ABNORMAL HIGH (ref 70–99)
Phosphorus: 4.3 mg/dL (ref 2.5–4.6)
Potassium: 4.8 mmol/L (ref 3.5–5.1)
Sodium: 135 mmol/L (ref 135–145)

## 2021-05-29 LAB — CBC
HCT: 31.1 % — ABNORMAL LOW (ref 39.0–52.0)
Hemoglobin: 9.5 g/dL — ABNORMAL LOW (ref 13.0–17.0)
MCH: 25.5 pg — ABNORMAL LOW (ref 26.0–34.0)
MCHC: 30.5 g/dL (ref 30.0–36.0)
MCV: 83.6 fL (ref 80.0–100.0)
Platelets: 169 10*3/uL (ref 150–400)
RBC: 3.72 MIL/uL — ABNORMAL LOW (ref 4.22–5.81)
RDW: 18.5 % — ABNORMAL HIGH (ref 11.5–15.5)
WBC: 7.5 10*3/uL (ref 4.0–10.5)
nRBC: 0 % (ref 0.0–0.2)

## 2021-05-29 LAB — GLUCOSE, CAPILLARY
Glucose-Capillary: 145 mg/dL — ABNORMAL HIGH (ref 70–99)
Glucose-Capillary: 180 mg/dL — ABNORMAL HIGH (ref 70–99)
Glucose-Capillary: 123 mg/dL — ABNORMAL HIGH (ref 70–99)
Glucose-Capillary: 129 mg/dL — ABNORMAL HIGH (ref 70–99)

## 2021-05-29 MED ORDER — AMLODIPINE BESYLATE 5 MG PO TABS
5.0000 mg | ORAL_TABLET | Freq: Every day | ORAL | Status: DC
  Administered 2021-05-29: 5 mg via ORAL
  Filled 2021-05-29: qty 1

## 2021-05-29 NOTE — Assessment & Plan Note (Addendum)
-  Has a history of CKD stage IIIb with a baseline creatinine 1.8-3 -His creatinine on admission was 2.4 and peaked at 3.9 so CRRT was started on 05/23/2021 -Nephrology felt that he had a cardiorenal cause of his AKI and had some proteinuria which was quantified at 400 mg to 4 1300 mg p.o. daily approximately and they felt that this is in the nonnephrotic proteinuria. -He was started on CRRT 05/23/2018 for massive volume overload and was weaned off oxygen his chest x-ray had improved. -He was transferred to [facility] for intermittent hemodialysis and his CRRT was discontinued on 05/28/2021; Nephrology feels no indication for hemodialysis at this point and feel that he may have some renal recovery and may be plateauing -Patient was subsequently placed on diuretics with Lasix 120 mg IV twice daily. -Patient had good urine output for the rest of the hospitalization and was -29.423 L during the hospitalization. -Patient's BUNs/creatinine went from 33/1.62 -> 34/1.76 -> 38/2.32 and is now elevated slightly at 51/2.60 -> 58/3.12 -> 58/2.88 -> 63/3.02 -> 61/3.03 -> 60/3.02>>> 58/2.79>>> 58/3.01>> 58/3.11>>> 60/2.85 by day of discharge. -Patient was transitioned from IV Lasix to oral torsemide 80 mg daily per cardiology recommendations. -Outpatient follow-up with nephrology/[facility] in the outpatient setting postdischarge.

## 2021-05-29 NOTE — Assessment & Plan Note (Signed)
-  Complicates overall prognosis and care -Estimated body mass index is 36.03 kg/m as calculated from the following:   Height as of this encounter: 5\' 8"  (1.727 m).   Weight as of this encounter: 107.5 kg.  -Weight Loss and Dietary Counseling given

## 2021-05-29 NOTE — Progress Notes (Signed)
Pt refused bedtime hydralazine at 2205 d/t his concern about his BP running low and he stated he had discussed with the doctor that he would not be taking hydralazine anymore. Pt BP was 116/62 at 2203. On call MD notified of pt refusal. Will continue to monitor.

## 2021-05-29 NOTE — Assessment & Plan Note (Addendum)
-  PCCM initially admitted the patient and as patient stabilized transferred to hospitalist team and PCCM subsequently signed off  -Patient had Nodular Infiltrates on CXR/CT and was not consistent with pulmonary edema -Initially concerned with elevated PCT and because of lack of response to diuretics that could be infectious vs. Inflammatory process. -They initially felt that hydralazine induced pneumonitis was possible but he had a negative antihistone antibody. -His HIV was negative -Per pulmonary he had a similar nodular infiltrates dating back to 01/2021 -He continues to have residual infiltrates on plain films despite significant volume reduction and clinical improvement. -Autoimmune panel has been largely negative except elevated kappa lambda light chains -His ESR was 56 and his ANA was negative and RF was negative as well as ANCA being negative. -UDS was negative and RV panel was negative his protein/creatinine ratio was 1.39 and trended down to 0.40 with a negative histone antibody negative -His kappa light chain was 109.4 and his lambda was 40.6 with a ratio being 2.69 -Patient oxygenation is improved and was satting 95% on room air by day of discharge  -Follow intermittent chest x-ray repeat chest  showed "A right jugular catheter remains in place, terminating over the mid SVC. The cardiac silhouette remains enlarged. Interstitial and airspace opacities in the right greater than left lungs have slightly improved. No sizable pleural effusion or pneumothorax is identified." -Once patient was adequately diuresed repeat CT chest without contrast was done on day of discharge per PCCM recommendations which showed significant interval improvement bilateral airspace disease with mild residual airspace disease in the right upper lobe, right middle lobe and to a lesser extent bilateral lower lobes.  Interval resolution of right pleural effusion.  Trace left pleural effusion.  -Fluid removal with CRRT has  been completed now he is at [facility] for intermittent hemodialysis and will defer dialysis to nephrology and they feel that there is no current indication for dialysis at this time and was placed on IV Lasix which was managed by cardiology and subsequently transition to oral torsemide 80 mg daily on day of discharge per cardiology recommendations.  -Patient with good diuresis during the hospitalization and was -29.423 L by day of discharge. -Patient also maintained on flutter valve and incentive spirometry.   -Patient be discharged in stable and improved condition with outpatient follow-up with pulmonary and cardiology.

## 2021-05-29 NOTE — Progress Notes (Signed)
Progress Note   Patient: Micheal Levy RCV:[id number] DOB: 1980/05/02 DOA: 05/18/2021     11 DOS: the patient was seen and examined on 05/29/2021   Brief hospital course: The patient is a 41 year old obese African-American male with a past medical history significant for but limited to diabetes mellitus type 2, hyperlipidemia, hypertension, history of CKD stage IIIa with baseline creatinine around 3, chronic diastolic CHF with last EF of 55 to 60% and grade 3 diastolic dysfunction noted on echo back in October 2022, history of substance abuse as well as other comorbidities who presented to the [name] ED on 05/18/2021 for respiratory distress.  He was recently hospitalized from 200-4 2023 for acute on chronic diastolic CHF acute on chronic kidney disease and at that time presented with dyspnea, PND, worsening pedal edema and elevated BNP.  His renal ultrasound at that time was within normal limits and he was treated with IV Lasix and transition to p.o. on discharge home.  He returns back on 05/18/2021 with worsening respiratory distress and initially was treated as an asthma exacerbation with epinephrine by EMS found to be hypertensive with systolic blood pressure in the 180s to 102H and diastolic blood pressure 852.  ABG was done and showed respiratory acidosis with a mixed picture with saturations in the 80s.  Chest x-ray was concerning for pulmonary edema and he had significant crackles on examination.  He was admitted for acute hypoxic respiratory failure due to bilateral infiltrates with arts and pulmonary hypertension.  Patient was started on nitro drip and transition to nicardipine for systolic blood pressure 778.  At that time he was given Lasix 40 mg x 1.  PCCM was consulted for admission in the setting of respiratory distress and hypertensive emergency and he was admitted on 05/18/2018 and then on 05/20/2021 he was placed on BiPAP 18/6 with 60% FiO2.  05/21/2021 he had left IJ central venous  catheter placed.  He remained on Cardene and 05/22/2021 his Cardene drip started being down titrated and his p.o. antihypertensives were uptitrated.  His respiratory status at that time was stable on 15 L high flow nasal cannula and he still had improvement but with marginal urine output with aggressive diuresis.  On 05/23/2021 he had continued improvement in his O2 needs but remained hypertensive off Cardene and nephrology was consulted for HD/CRRT to optimize for right heart cath.  His right IJ catheter was changed out to a trialysis catheter and on 05/24/2021 he initiated CRRT and had 3.2 L removed and his O2 saturations we will wean down to 10 L and he tolerated this well.  On 05/26/2021 he subjectively felt better but his chest x-ray did not change and had elevated blood pressure despite multiple agents and remained on CRRT.  On tube 22,023 he was weaned on 2 L of nasal cannula and he was net 5.9 L and felt better but subsequently he was transferred to [facility] for right heart catheterization and initiation of intermittent hemodialysis.  Cardiology evaluated the patient and planning for a heart catheterization on Friday.  Patient's volume will has improved and his chest x-ray is better and he is down 15 to 20 kg with CRRT.  Plan is for right heart cath scheduled for Friday and patient has initiated 24-hour urine protein collection.  The recommendation is for an abdominal fat pad/bone marrow biopsy for amyloid and the PCCM PA reviewed the case with hematology and they felt that the light chains could be some degree of poor clearance  of renal failure and recommending further biopsy to rule out other disease processes but given this a myeloma panel will be sent at 24-hour urine for completeness.  May discussed with IR about a fat pad biopsy/bone marrow biopsy.  Assessment and Plan: * Acute respiratory failure ([place])- (present on admission) -PCCM Signed off  -Patient had Nodular Infiltrates on CXR/CT and was  not consistent with pulmonary edema -Initially concerned with elevated PCT and because of lack of response to diuretics that could be infectious vs. Inflammatory process. -They initially felt that hydralazine induced pneumonitis was possible but he had a negative antihistone antibody. -His HIV was negative -Per pulmonary he had a similar nodular infiltrates dating back to 01/2021 -He continues to have residual infiltrates on plain films despite significant volume reduction -Autoimmune panel has been largely negative except elevated kappa lambda light chains -His ESR was 56 and his ANA was negative and RF was negative as well as ANCA being negative. -UDS was negative and RV panel was negative his protein/creatinine ratio was 1.39 and trended down to 0.40 with a negative histone antibody negative -His kappa light chain was 109.4 and his lambda was 40.6 with a ratio being 2.69 -SpO2: 90 % O2 Flow Rate (L/min): 2 L/min (Rechecked saturation now with 2 L O2) FiO2 (%): 40 % -Per pulmonary continue weaning oxygen saturations greater than 90% -Follow intermittent chest x-ray repeat chest x-ray in the morning -We will consider repeat CT imaging once he is dry to evaluate the parenchyma nodular infiltrates and he will get a right heart cath on Friday to evaluate his pulmonary pressures; will likely repeat noncontrast imaging once he is dialyzed -Fluid removal with CRRT has been completed now he is at [facility] for intermittent hemodialysis and will defer dialysis to nephrology. -Continue to monitor respiratory status carefully and if necessary will place him back on nebs -We will order flutter valve and incentive spirometry    Obesity (BMI 80-99.8) -Complicates overall prognosis and care -Estimated body mass index is 36.03 kg/m as calculated from the following:   Height as of this encounter: $RemoveBeforeD'5\' 8"'[id number]$  (1.727 m).   Weight as of this encounter: 107.5 kg.  -Weight Loss and Dietary Counseling  given   Normocytic anemia - Patient's hemoglobin/hematocrit went from 9.6/31.9 and is now 9.5/31.1 -Likely anemia of chronic kidney disease -Check anemia panel in the a.m. -Nephrology recommending transfusing as needed hemoglobin for hemoglobin less than 7 -Continue to monitor for signs and symptoms bleeding; currently no overt bleeding noted -Repeat CBC in a.m.   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  He is getting a myeloma panel sent out and a 24-hour urine protein. -His kappa and lambda light chains were elevated and his ratio was elevated. -We will consider obtaining a fat pad biopsy and and/or bone marrow biopsy and will need to discuss with hematology further -echo was unchanged but showed an more vigorous  elevated LV function. -He has elevated Lambda Light Chains with Concern for Amyloidosis -Cardiology evaluating and he will need outpatient hematology referral and will discuss with them about next steps about possibly getting a bone marrow biopsy and abdominal fat pad biopsy while in house -Patient is to go for right heart cath on Friday -His antihistone testing was negative so his hydralazine has been resumed -Cardiology recommending continuing carvedilol 25 mg p.o. twice daily as well as Imdur 120 mg p.o. daily.   Recommending restarting amlodipine 5 mg p.o. daily if blood pressure still at goal and they are recommending starting minoxidil.  They also recommended clonidine 0.3 mg 3 times daily -Per cardiology no ACE/ARB/ARNI or mineralocorticoid receptor antagonist given his renal function -Cardiology recommended outpatient sleep study -Further plans per cardiology    Uncontrolled type 2 diabetes mellitus with hyperglycemia ([place])- (present on admission) -Patient was placed on a resistant sign scale insulin before meals and at bedtime and also has meal coverage with 4 units 3 times daily -CBGs ranging from 123-180 -HbA1c 6.6  Hypertensive urgency- (present on admission) - Appreciate cardiology evaluation -Off of the nitro drip and Cardene drips -Cardiology has initiated carvedilol 25 mg p.o. twice daily, Imdur 120 mg p.o. daily, clonidine 0.3 mg p.o. 3 times daily -Cardiology is also reinitiated amlodipine at 5 mg p.o. daily and recommending resuming minoxidil blood pressure still remain uncontrolled and not at goal -Patient has been removed off of the hydralazine but since his antihistone testing is negative they discussed that it is safe for him to take the hydralazine but he refused it on 05/28/2021 -Patient get cardiology input and a right heart cath as well as some cardiac imaging at some point -Currently cardiology recommends no ACE inhibitor/ARB/ARNI or MRI given his renal function -Continue monitor blood pressures per protocol   AKI (acute kidney injury) ([place]) -Has a history of CKD stage IIIb with a baseline creatinine 1.8-3 -His creatinine on admission was 2.4 and peaked at 3.9 so CRRT was started on 05/23/2021 -Nephrology felt that he had a cardiorenal cause of his AKI and had some proteinuria which was quantified at 400 mg to 4 1300 mg p.o. daily approximately and they felt that this is in the nonnephrotic proteinuria. -He started on CRRT 05/23/2018 for massive volume overload and was weaned  off oxygen his chest x-ray had improved but he is back on oxygen today -He was transferred to [facility] for intermittent hemodialysis and his CRRT was discontinued on 05/28/2021 -Patient's BUNs/creatinine went from 33/1.62 -> 34/1.76 -> 38/2.32 -Further care per nephrology and will avoid further nephrotoxic medication, consciousness, hypotension renally dose medications -Repeat CMP in a.m.    Nutrition Documentation    [facility] to Hosp-Admission (Current) from 05/18/2021 in [facility]  Nutrition Problem Increased nutrient needs  Etiology acute illness  Nutrition Goal Patient will meet greater than or equal to 90% of their needs  Interventions Ensure Enlive (each supplement provides 350kcal and 20 grams of protein)      Subjective: Seen and examined at bedside and was doing okay.  Did not feel as short of breath.  No lightheadedness or dizziness.  Ready for his cath on Friday.  No other concerns or complaints this time.  Physical Exam: Vitals:   05/28/21 2203 05/29/21 0821 05/29/21 1106 05/29/21 1603  BP: 116/62 (!) 170/96 (!) 146/76 132/80  Pulse:  73    Resp:      Temp:      TempSrc:  SpO2:      Weight:      Height:       Examination: Physical Exam:  Constitutional: WN/WD AFRICAN-AMERICAN MALE CURRENTLY IN NAD and appears calm and comfortable Respiratory: Diminished to auscultation bilaterally with coarse breath sounds, no wheezing, rales, rhonchi or crackles. Normal respiratory effort and patient is not tachypenic. No accessory muscle use.  Unlabored breathing Cardiovascular: RRR, no murmurs / rubs / gallops. S1 and S2 auscultated.  Has mild 1+ lower extremity edema Abdomen: Soft, non-tender, distended secondary to body habitus.  Bowel sounds positive.  GU: Deferred. Musculoskeletal: No clubbing / cyanosis of digits/nails. No joint deformity upper and lower extremities.  Skin: No rashes, lesions, ulcers on limited skin evaluation. No  induration; Warm and dry.  Neurologic: CN 2-12 grossly intact with no focal deficits.   Data Reviewed:  I have independently reviewed and interpreted the patient's BMP and CBC  Renal function continues to worsen and his BUN/creatinine now 38/2.32.  Blood sugars are still elevated and his CBG was 145.  Hemoglobin/hematocrit is relatively stable at 9.5/31.1  Family Communication: Discussed with Cousin at bedside   Disposition: Status is: Inpatient Remains inpatient appropriate because: Patient to undergo [place] Friday and will likely need Inpatient Abd Fat Pad Bx/Bone Marrow Bx   Planned Discharge Destination: Home with Home Health  VTE prophylaxis: Heparin 5000 units subcu every 8h  Author: Raiford Noble, DO [facility] 05/29/2021 5:40 PM  For on call review [email or web address].

## 2021-05-29 NOTE — Assessment & Plan Note (Addendum)
-   Patient's hemoglobin/hematocrit is relatively stable and and trend has gone from 9.7/30.4 -> 9.1/29.9 -> 9.4/30.3 -> 9.4/30.2 -> 10.3/32.9>> 9.6/30.7>> 9.6/31.2>> 10.0/32.1>> 10.0/31.9 -Likely anemia of chronic kidney disease -Anemia panel was checked and iron level was 33, U IBC is 261, TIBC 294, saturation ratios of 11%, ferritin level 111, folate level 11.1, vitamin B12 626 -Nephrology recommended transfusing as needed hemoglobin for hemoglobin < 7 -Outpatient follow-up with nephrology and PCP.

## 2021-05-29 NOTE — Progress Notes (Signed)
RT note. Patient currently on 2 L [place] sat 98%, bipap not needed at this time. RT will continue to monitor.

## 2021-05-29 NOTE — Assessment & Plan Note (Addendum)
-  Hemoglobin A1c 6.6 (05/21/2021 ). -Patient was placed on a resistant sign scale insulin before meals and at bedtime and also has meal coverage with 4 units 3 times daily. -Patient with discharge home back on home regimen of Lantus 5 units daily, glipizide.  Metformin discontinued secondary to renal function. -Outpatient follow-up with PCP.

## 2021-05-29 NOTE — Assessment & Plan Note (Addendum)
-  Patient status post right heart cath. -His vasculitis panel was negative but his kappa and lambda light chains are slightly elevated -he is off of CRRT and has been transferred to [facility] for intermittent hemodialysis -Underwent 24-hour urine protein collection; urine immunofixation. -Once he is euvolemic pulmonary recommended repeating CT scan to evaluate his parenchyma and nodular infiltrates which was done on day of discharge 06/08/2021;  -Fluid was removed via CRRT and will be now controlled with intermittent hemodialysis but renal recommended no dialysis at this time and his Temp Cath removed; -Was on Lasix 80 mg IV every 12 hours and this has been increased to 120 mg IV every 12 hours per cardiology recommendations. -Patient diuresed well and was subsequently transitioned to oral torsemide 80 mg daily per cardiology. -Outpatient follow-up with cardiology and pulmonary.

## 2021-05-29 NOTE — Hospital Course (Addendum)
The patient is a 41 year old obese African-American male with a past medical history significant for but limited to diabetes mellitus type 2, hyperlipidemia, hypertension, history of CKD stage IIIa with baseline creatinine around 3, chronic diastolic CHF with last EF of 55 to 60% and grade 3 diastolic dysfunction noted on echo back in October 2022, history of substance abuse as well as other comorbidities who presented to the [name] ED on 05/18/2021 for respiratory distress.  He was recently hospitalized from 200-4 2023 for acute on chronic diastolic CHF acute on chronic kidney disease and at that time presented with dyspnea, PND, worsening pedal edema and elevated BNP.  His renal ultrasound at that time was within normal limits and he was treated with IV Lasix and transition to p.o. on discharge home.  He returns back on 05/18/2021 with worsening respiratory distress and initially was treated as an asthma exacerbation with epinephrine by EMS found to be hypertensive with systolic blood pressure in the 180s to 622Q and diastolic blood pressure 333.  ABG was done and showed respiratory acidosis with a mixed picture with saturations in the 80s.  Chest x-ray was concerning for pulmonary edema and he had significant crackles on examination.  He was admitted for acute hypoxic respiratory failure due to bilateral infiltrates with arts and pulmonary hypertension.  Patient was started on nitro drip and transition to nicardipine for systolic blood pressure 545.  At that time he was given Lasix 40 mg x 1.  PCCM was consulted for admission in the setting of respiratory distress and hypertensive emergency and he was admitted on 05/18/2021 and then on 05/20/2021 he was placed on BiPAP 18/6 with 60% FiO2.  05/21/2021 he had left IJ central venous catheter placed.  He remained on Cardene and 05/22/2021 his Cardene drip started being down titrated and his p.o. antihypertensives were uptitrated.  His respiratory status at that  time was stable on 15 L high flow nasal cannula and he still had improvement but with marginal urine output with aggressive diuresis.  On 05/23/2021 he had continued improvement in his O2 needs but remained hypertensive off Cardene and nephrology was consulted for HD/CRRT to optimize for right heart cath.  His right IJ catheter was changed out to a trialysis catheter and on 05/24/2021 he initiated CRRT and had 3.2 L removed and his O2 saturations we will wean down to 10 L and he tolerated this well.  On 05/26/2021 he subjectively felt better but his chest x-ray did not change and had elevated blood pressure despite multiple agents and remained on CRRT.  On tube 22,023 he was weaned on 2 L of nasal cannula and he was net 5.9 L and felt better but subsequently he was transferred to [facility] for right heart catheterization and initiation of intermittent hemodialysis.  Cardiology evaluated the patient and planning for a heart catheterization on Friday.  Patient's volume will has improved and his chest x-ray is better and he is down 15 to 20 kg with CRRT.  Plan is for right heart cath scheduled for Friday and patient has initiated 24-hour urine protein collection.  The recommendation is for an abdominal fat pad/bone marrow biopsy for amyloid and the PCCM PA reviewed the case with hematology and they felt that the light chains could be some degree of poor clearance of renal failure and recommending further biopsy to rule out other disease processes but given this a myeloma panel will be sent at 24-hour urine for completeness.  May discuss with IR  about a fat pad biopsy/bone marrow biopsy but will await his right heart cath first.  Patient's renal function is mildly elevated today and may be stabilizing.  Nephrology recommends no dialysis at this time and they recommend continuing the patient on a renal diet for now.  Plan is for cardiac catheterization when patient's volume status is improved.  Cardiology is increasing  his Norvasc and they are planning on outpatient work-up for questionable cardiac amyloidosis.  Patient underwent cardiac cath on 05/31/2021.  Renal function continued to climb slowly so will need to monitor carefully but is now improving. Cardiology trying a dose of IV Lasix and continuing monitor of renal Fxn. He is on IV Lasix 80 mg BID. Nephrology D/C Temp HD Cath.  Patient is being diuresed and Cr is slightly bumped to 3.0. Cardiology recommending continuing IV Lasix today and possibly converting to po Demadex.  He remains significantly volume overloaded and had extremely high pulmonary blood pressures they are recommending continue IV Lasix and increasing this to 120 mg p.o. twice daily

## 2021-05-29 NOTE — Assessment & Plan Note (Addendum)
-  There is clinical suspicion for hypertensive heart disease but needs to rule out amyloid and he has a hematology referral.  He is getting a myeloma panel sent out and a 24-hour urine protein.  -SPEP had immunofixation that was normal  -His kappa and lambda light chains were elevated and his ratio was elevated.  -We will consider obtaining a fat pad biopsy and and/or bone marrow biopsy and will need to discuss with hematology further -echo was unchanged but showed an more vigorous elevated LV function. 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 twice daily as well as Imdur 120 mg p.o. daily, Norvasc, clonidine, hydralazine. 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 -Patient diuresed well and was transitioned to oral torsemide on day of discharge. -Outpatient follow-up with cardiology.

## 2021-05-29 NOTE — Progress Notes (Signed)
Micheal Levy  Subjective: I/O net neg 3 L yesterday. UOP 575 yest. Creat up 2.3 today, BUN 38. K ok.   Vitals:   05/28/21 1600 05/28/21 1751 05/28/21 2203 05/29/21 0821  BP: (!) 124/58  116/62 (!) 170/96  Pulse: 69 85  73  Resp: (!) 24 (!) 24    Temp: 98.8 F (37.1 C) 99.2 F (37.3 C)    TempSrc: Oral Oral    SpO2: 90%     Weight:      Height:        Exam: Gen:nad, sitting up in bed, in good spirits CVS:rrr, s1s2 Resp: normal WOB VZS:MOLMB, soft Ext: 1+ pitting edema b/l le's Neuro: awake, alert, speech clear and coherent, moves all ext spontaneously Dialysis access: RIJ trialysis catheter     Summary: 41 yo w/ hx of HTN, DM, CKD b/l creat 1.8- 3 admitted for resp distress. Had recent admit for decomp diast CHF and AKI/ CKD from 2/1- 2/4 rx'd w/ diuresis then dc'd home. Returned on 2/11 w/ resp distress, uncont HTN and hypoxemia. CXR showed pulm edema, started on IV cardene and IV lasix. Poor response to IV lasix so started on CRRT 2/16 for volume reduction. Needs RHC at [facility] when stable.       Date   Creat  eGFR   2017   1.23   Sept 2018  1.32   Dec 2018  2.86 >> 1.30  AKI   2019   1.22- 1.30   2021   1.59- 2.75   Oct 2022  2.92 >> 2.28   AKI episode   Nov 2022  1.68- 2.08 Apr 2021  1.80- 2.73 29- 37, IIIb   Feb 1- 4, 2023 3.35 >> 3.03   Feb 11  2.63  Admit   Feb 16  3.93  CRRT started   Feb 21  1.76  CRRT dc'd      May 29, 2021  2.32      ECHO 2/11 - LVEF 70- 75%, mild LVH, indeterminate diast parameters   Assessment/ Plan: AKI on CKD 3 - b/l creat 1.80- 2.73, eGFR 29- 37 from Jan 2023. Creat here 2.4 on admit, peaked at 3.9 and CRRT started 2/16. Has prior admits for severe uncont HTN, seizures/ CVA, AHRF/ PNA/ diast CHF. Had some proteinuria quantitated to $RemoveBefore'400mg'hzHSNkmqsaJxH$  - $'1300mg'F$  per day approximately which is non-nephrotic proteinuria. Suspect renal failure primarily d/t chronic uncont HTN +/-  cardiorenal/ diast HF component. Started  CRRT 2/16 for severe vol overload. Weaned off O2 after approx 15 kg down. CRRT dc'd 2/21 and pt moved to [facility] for iHD. Labs/ vol good today, no indication for HD yet. Will keep on renal diet for now. Will follow.  Vol overload - edema, CXR's improved and wt's down 20kg from peak and 13 kg from admit wt. Edema mostly resolved. AHRF - PNA and/or pulm edema. SP CRRT and 7d IV cefepime. Better. HTN urgency - sp Cardene gtt initially, getting po coreg and clonidine now Anemia - Hb 9- 10 range, normal mcv, tx prn Hb <7 DM 2     Micheal Levy 05/29/2021, 9:50 AM   Recent Labs  Lab 05/28/21 0254 05/29/21 0303  K 4.6 4.8  BUN 34* 38*  CREATININE 1.76* 2.32*  ALBUMIN 2.7* 2.5*  CALCIUM 8.2* 8.5*  PHOS 2.2* 4.3  HGB 9.6* 9.5*    Inpatient medications:  amLODipine  5 mg Oral Daily   B-complex with vitamin C  1  tablet Oral Daily   carvedilol  25 mg Oral BID WC   Chlorhexidine Gluconate Cloth  6 each Topical Q0600   cloNIDine  0.3 mg Oral TID   feeding supplement  237 mL Oral BID BM   heparin  5,000 Units Subcutaneous Q8H   hydrALAZINE  50 mg Oral Q8H   insulin aspart  0-20 Units Subcutaneous TID WC   insulin aspart  0-5 Units Subcutaneous QHS   insulin aspart  4 Units Subcutaneous TID WC   isosorbide mononitrate  120 mg Oral Daily   mouth rinse  15 mL Mouth Rinse BID   pantoprazole  40 mg Oral QHS   sodium chloride flush  3 mL Intravenous Q12H    sodium chloride Stopped (05/24/21 2225)   sodium chloride, acetaminophen, docusate sodium, labetalol, polyethylene glycol, sodium chloride flush

## 2021-05-29 NOTE — Assessment & Plan Note (Addendum)
°-  Off of the nitro drip and Cardene drips -Cardiology initiated carvedilol 25 mg p.o. twice daily, Imdur 120 mg p.o. daily, clonidine 0.3 mg p.o. 3 times daily, Norvasc 10 mg daily, hydralazine 75mg  3 times daily. -Cardiology recommended resuming minoxidil if blood pressure still remain uncontrolled and not at goal. -Blood pressure improved on above regimen. -Patient had been removed off of the hydralazine but since his antihistone testing was negative they discussed that it was safe for him to take the hydralazine but he refused it on 05/28/2021 however subsequently agreed and was placed back on hydralazine -Currently cardiology recommended no ACE inhibitor/ARB/ARNI or MRI given his renal function -Outpatient follow-up with cardiology

## 2021-05-29 NOTE — Progress Notes (Signed)
Progress Note  Patient Name: Micheal Levy Date of Encounter: 05/29/2021  [facility] Cardiologist: Berniece Salines, DO   Subjective   Off CVVHD-> planned for iHD. Back on Oxygen. Feels better, wants to get things figured out.  Inpatient Medications    Scheduled Meds:  B-complex with vitamin C  1 tablet Oral Daily   carvedilol  25 mg Oral BID WC   Chlorhexidine Gluconate Cloth  6 each Topical Q0600   cloNIDine  0.3 mg Oral TID   feeding supplement  237 mL Oral BID BM   heparin  5,000 Units Subcutaneous Q8H   hydrALAZINE  50 mg Oral Q8H   insulin aspart  0-20 Units Subcutaneous TID WC   insulin aspart  0-5 Units Subcutaneous QHS   insulin aspart  4 Units Subcutaneous TID WC   isosorbide mononitrate  120 mg Oral Daily   mouth rinse  15 mL Mouth Rinse BID   pantoprazole  40 mg Oral QHS   sodium chloride flush  3 mL Intravenous Q12H   Continuous Infusions:  sodium chloride Stopped (05/24/21 2225)   PRN Meds: sodium chloride, acetaminophen, docusate sodium, labetalol, polyethylene glycol, sodium chloride flush   Vital Signs    Vitals:   05/28/21 1600 05/28/21 1751 05/28/21 2203 05/29/21 0821  BP: (!) 124/58  116/62 (!) 170/96  Pulse: 69 85  73  Resp: (!) 24 (!) 24    Temp: 98.8 F (37.1 C) 99.2 F (37.3 C)    TempSrc: Oral Oral    SpO2: 90%     Weight:      Height:        Intake/Output Summary (Last 24 hours) at 05/29/2021 0835 Last data filed at 05/29/2021 0831 Gross per 24 hour  Intake 1358.61 ml  Output 1938 ml  Net -579.39 ml   Last 3 Weights 05/28/2021 05/27/2021 05/26/2021  Weight (lbs) 236 lb 15.9 oz 243 lb 9.7 oz 251 lb 1.7 oz  Weight (kg) 107.5 kg 110.5 kg 113.9 kg      Telemetry    SR  - Personally Reviewed  ECG    No new ECG tracing today. - Personally Reviewed  Physical Exam   GEN: No distress, morbid obesity Cardiac: RRR. S1, S2, and S3. No rubs or gallops. Respiratory: bilateral wheezes, normal rate GI: Soft, non-distended, and  non-tender. MS: 2+ pitting edema of bilateral lower extremities (right > left). No deformity. Skin: Warm . Neuro:  No focal deficits. Psych: Normal affect. Responds appropriately.  Labs    High Sensitivity Troponin:   Recent Labs  Lab 05/08/21 1634 05/08/21 1840 05/18/21 0008 05/18/21 0250 05/18/21 0606  TROPONINIHS 24* 23* 26* 48* 68*     Chemistry Recent Labs  Lab 05/26/21 0253 05/26/21 1448 05/27/21 0251 05/27/21 2014 05/28/21 0254 05/29/21 0303  NA 135   < > 135 134* 133* 135  K 4.6   < > 4.6 4.6 4.6 4.8  CL 104   < > 103 103 103 100  CO2 27   < > 28 27 27 28   GLUCOSE 171*   < > 154* 143* 147* 145*  BUN 41*   < > 35* 33* 34* 38*  CREATININE 1.54*   < > 1.66* 1.62* 1.76* 2.32*  CALCIUM 8.6*   < > 8.4* 8.0* 8.2* 8.5*  MG 2.7*  --  2.7*  --  2.6*  --   ALBUMIN 2.8*   < > 2.7* 2.8* 2.7* 2.5*  GFRNONAA 57*   < >  52* 54* 49* 35*  ANIONGAP 4*   < > 4* 4* 3* 7   < > = values in this interval not displayed.    Lipids No results for input(s): CHOL, TRIG, HDL, LABVLDL, LDLCALC, CHOLHDL in the last 168 hours.  Hematology Recent Labs  Lab 05/27/21 0251 05/28/21 0254 05/29/21 0303  WBC 11.2* 9.0 7.5  RBC 3.66* 3.70* 3.72*  HGB 9.5* 9.6* 9.5*  HCT 31.2* 31.9* 31.1*  MCV 85.2 86.2 83.6  MCH 26.0 25.9* 25.5*  MCHC 30.4 30.1 30.5  RDW 18.6* 18.6* 18.5*  PLT 166 173 169   Thyroid No results for input(s): TSH, FREET4 in the last 168 hours.  BNPNo results for input(s): BNP, PROBNP in the last 168 hours.  DDimer No results for input(s): DDIMER in the last 168 hours.   Radiology    No results found.  Cardiac Studies   Lower Extremity Venous Doppler 05/08/2021: Summary: RIGHT:  - There is no evidence of deep vein thrombosis in the lower extremity.  - There is no evidence of superficial venous thrombosis.  - No cystic structure found in the popliteal fossa.  subcutaneous edema noted.     LEFT:  - No evidence of common femoral vein obstruction.  _______________    Echocardiogram 05/18/2021: Impressions: 1. Left ventricular ejection fraction, by estimation, is 70 to 75%. The  left ventricle has hyperdynamic function. The left ventricle has no  regional wall motion abnormalities. There is mild concentric left  ventricular hypertrophy. Left ventricular  diastolic parameters are indeterminate. Elevated left ventricular  end-diastolic pressure. The average left ventricular global longitudinal  strain is -24.8 %. The global longitudinal strain is normal.   2. Right ventricular systolic function is normal. The right ventricular  size is mildly enlarged. There is moderately elevated pulmonary artery  systolic pressure. The estimated right ventricular systolic pressure is  123456 mmHg.   3. The mitral valve is normal in structure. No evidence of mitral valve  regurgitation. No evidence of mitral stenosis.   4. The aortic valve is normal in structure. Aortic valve regurgitation is  not visualized. No aortic stenosis is present.   5. Aortic dilatation noted. There is mild dilatation of the ascending  aorta, measuring 38 mm.   6. The inferior vena cava is dilated in size with <50% respiratory  variability, suggesting right atrial pressure of 15 mmHg.  _______________  Patient Profile     41 y.o. male with a history of diastolic CHF, hypertension, type 2 diabetes mellitus, CKD stage III-IV , stroke, seizure disorder, and substance abuse in remission who is being seen 05/27/2021 for the evaluation of CHF at the request of Dr. Valeta Harms.    Assessment & Plan    Acute Hypoxic Respiratory Failure Secondary to ARDS Restrictive Cardiomyopathy (acute on Chronic Diastolic HF Elevated light chains- query or AL amyloid WHO Group II PH AKI on CKD stage IV, ESRD DM with CKD Prior substance abuse - hypervolemic - clinical suspicion for HTN heart disease > amyloid but planned for outpatient heme referral; if there is equipoise after biopsy CMR could be considered (would  need cyclic GAD) - no evidence of RAS - outpatient sleep apnea testing recommended - Anti-histone testing negative, we have discussed that it is safe for him to take hydralazine (has refused 2/21) - Continue Coreg 25mg  twice daily. - Continue Imdur 120mg  daily. - will restart norvasc 5; if BP is still not at goal minoxidil can be restarted - Continue Clonidine 0.3mg  three times  daily today. - No ACE/ARB/ARNI or MRA given renal function. - Recommend outpatient sleep study. - We have discussed the importance of achieving euvolemia prior to DC, planning for RHC this Friday - Risks and benefits of cardiac catheterization have been discussed with the patient.  These include bleeding, infection, kidney damage, stroke, heart attack, death.  The patient understands these risks and is willing to proceed.   For questions or updates, please contact [place] Please consult [email or web address] for contact info under      Rudean Haskell, [address] [place],  [address] [phone]  8:35 AM

## 2021-05-30 ENCOUNTER — Inpatient Hospital Stay (HOSPITAL_COMMUNITY): Payer: Medicaid Other

## 2021-05-30 DIAGNOSIS — N179 Acute kidney failure, unspecified: Secondary | ICD-10-CM | POA: Diagnosis not present

## 2021-05-30 DIAGNOSIS — I160 Hypertensive urgency: Secondary | ICD-10-CM | POA: Diagnosis not present

## 2021-05-30 DIAGNOSIS — I5033 Acute on chronic diastolic (congestive) heart failure: Secondary | ICD-10-CM | POA: Diagnosis not present

## 2021-05-30 DIAGNOSIS — J9601 Acute respiratory failure with hypoxia: Secondary | ICD-10-CM | POA: Diagnosis not present

## 2021-05-30 LAB — COMPREHENSIVE METABOLIC PANEL
ALT: 55 U/L — ABNORMAL HIGH (ref 0–44)
AST: 17 U/L (ref 15–41)
Albumin: 2.6 g/dL — ABNORMAL LOW (ref 3.5–5.0)
Alkaline Phosphatase: 56 U/L (ref 38–126)
Anion gap: 7 (ref 5–15)
BUN: 51 mg/dL — ABNORMAL HIGH (ref 6–20)
CO2: 26 mmol/L (ref 22–32)
Calcium: 8.8 mg/dL — ABNORMAL LOW (ref 8.9–10.3)
Chloride: 102 mmol/L (ref 98–111)
Creatinine, Ser: 2.6 mg/dL — ABNORMAL HIGH (ref 0.61–1.24)
GFR, Estimated: 31 mL/min — ABNORMAL LOW (ref >60)
Glucose, Bld: 165 mg/dL — ABNORMAL HIGH (ref 70–99)
Potassium: 5 mmol/L (ref 3.5–5.1)
Sodium: 135 mmol/L (ref 135–145)
Total Bilirubin: 0.4 mg/dL (ref 0.3–1.2)
Total Protein: 5.8 g/dL — ABNORMAL LOW (ref 6.5–8.1)

## 2021-05-30 LAB — RENAL FUNCTION PANEL
Albumin: 2.6 g/dL — ABNORMAL LOW (ref 3.5–5.0)
Anion gap: 8 (ref 5–15)
BUN: 51 mg/dL — ABNORMAL HIGH (ref 6–20)
CO2: 25 mmol/L (ref 22–32)
Calcium: 8.7 mg/dL — ABNORMAL LOW (ref 8.9–10.3)
Chloride: 101 mmol/L (ref 98–111)
Creatinine, Ser: 2.59 mg/dL — ABNORMAL HIGH (ref 0.61–1.24)
GFR, Estimated: 31 mL/min — ABNORMAL LOW (ref >60)
Glucose, Bld: 164 mg/dL — ABNORMAL HIGH (ref 70–99)
Phosphorus: 4.7 mg/dL — ABNORMAL HIGH (ref 2.5–4.6)
Potassium: 5 mmol/L (ref 3.5–5.1)
Sodium: 134 mmol/L — ABNORMAL LOW (ref 135–145)

## 2021-05-30 LAB — GLUCOSE, CAPILLARY
Glucose-Capillary: 139 mg/dL — ABNORMAL HIGH (ref 70–99)
Glucose-Capillary: 177 mg/dL — ABNORMAL HIGH (ref 70–99)
Glucose-Capillary: 165 mg/dL — ABNORMAL HIGH (ref 70–99)
Glucose-Capillary: 147 mg/dL — ABNORMAL HIGH (ref 70–99)

## 2021-05-30 LAB — IRON AND TIBC
Iron: 33 ug/dL — ABNORMAL LOW (ref 45–182)
Saturation Ratios: 11 % — ABNORMAL LOW (ref 17.9–39.5)
TIBC: 294 ug/dL (ref 250–450)
UIBC: 261 ug/dL

## 2021-05-30 LAB — CBC WITH DIFFERENTIAL/PLATELET
Abs Immature Granulocytes: 0.12 10*3/uL — ABNORMAL HIGH (ref 0.00–0.07)
Basophils Absolute: 0 10*3/uL (ref 0.0–0.1)
Basophils Relative: 0 %
Eosinophils Absolute: 0.2 10*3/uL (ref 0.0–0.5)
Eosinophils Relative: 3 %
HCT: 30.7 % — ABNORMAL LOW (ref 39.0–52.0)
Hemoglobin: 9.4 g/dL — ABNORMAL LOW (ref 13.0–17.0)
Immature Granulocytes: 2 %
Lymphocytes Relative: 20 %
Lymphs Abs: 1.5 10*3/uL (ref 0.7–4.0)
MCH: 25.3 pg — ABNORMAL LOW (ref 26.0–34.0)
MCHC: 30.6 g/dL (ref 30.0–36.0)
MCV: 82.5 fL (ref 80.0–100.0)
Monocytes Absolute: 0.9 10*3/uL (ref 0.1–1.0)
Monocytes Relative: 12 %
Neutro Abs: 4.8 10*3/uL (ref 1.7–7.7)
Neutrophils Relative %: 63 %
Platelets: 159 10*3/uL (ref 150–400)
RBC: 3.72 MIL/uL — ABNORMAL LOW (ref 4.22–5.81)
RDW: 18.6 % — ABNORMAL HIGH (ref 11.5–15.5)
WBC: 7.6 10*3/uL (ref 4.0–10.5)
nRBC: 0 % (ref 0.0–0.2)

## 2021-05-30 LAB — PHOSPHORUS: Phosphorus: 4.9 mg/dL — ABNORMAL HIGH (ref 2.5–4.6)

## 2021-05-30 LAB — MAGNESIUM: Magnesium: 2.5 mg/dL — ABNORMAL HIGH (ref 1.7–2.4)

## 2021-05-30 LAB — RETICULOCYTES
Immature Retic Fract: 11.8 % (ref 2.3–15.9)
RBC.: 3.65 MIL/uL — ABNORMAL LOW (ref 4.22–5.81)
Retic Count, Absolute: 118.6 10*3/uL (ref 19.0–186.0)
Retic Ct Pct: 3.3 % — ABNORMAL HIGH (ref 0.4–3.1)

## 2021-05-30 LAB — FOLATE: Folate: 11.1 ng/mL (ref >5.9)

## 2021-05-30 LAB — FERRITIN: Ferritin: 111 ng/mL (ref 24–336)

## 2021-05-30 LAB — VITAMIN B12: Vitamin B-12: 626 pg/mL (ref 180–914)

## 2021-05-30 MED ORDER — SODIUM CHLORIDE 0.9 % IV SOLN: 250.0000 mL | INTRAVENOUS | Status: DC | PRN

## 2021-05-30 MED ORDER — SODIUM CHLORIDE 0.9% FLUSH: 3.0000 mL | INTRAVENOUS | Status: DC | PRN

## 2021-05-30 MED ORDER — SODIUM CHLORIDE 0.9 % IV SOLN: INTRAVENOUS | Status: DC

## 2021-05-30 MED ORDER — AMLODIPINE BESYLATE 10 MG PO TABS
10.0000 mg | ORAL_TABLET | Freq: Every day | ORAL | Status: DC
  Administered 2021-05-30 – 2021-06-08 (×10): 10 mg via ORAL
  Filled 2021-05-30 (×10): qty 1

## 2021-05-30 MED ORDER — ASPIRIN EC 81 MG PO TBEC
81.0000 mg | DELAYED_RELEASE_TABLET | Freq: Once | ORAL | Status: AC
  Administered 2021-05-31: 81 mg via ORAL
  Filled 2021-05-30: qty 1

## 2021-05-30 MED ORDER — SODIUM CHLORIDE 0.9% FLUSH
3.0000 mL | Freq: Two times a day (BID) | INTRAVENOUS | Status: DC
  Administered 2021-05-30: 3 mL via INTRAVENOUS

## 2021-05-30 NOTE — Assessment & Plan Note (Signed)
See AKI ?

## 2021-05-30 NOTE — Progress Notes (Addendum)
Progress Note   Patient: Micheal Levy XBD:[id number] DOB: [birth year] DOA: 05/18/2021     12 DOS: the patient was seen and examined on 05/30/2021   Brief hospital course: The patient is a 41 year old obese African-American male with a past medical history significant for but limited to diabetes mellitus type 2, hyperlipidemia, hypertension, history of CKD stage IIIa with baseline creatinine around 3, chronic diastolic CHF with last EF of 55 to 60% and grade 3 diastolic dysfunction noted on echo back in October 2022, history of substance abuse as well as other comorbidities who presented to the [name] ED on 05/18/2021 for respiratory distress.  He was recently hospitalized from 200-4 2023 for acute on chronic diastolic CHF acute on chronic kidney disease and at that time presented with dyspnea, PND, worsening pedal edema and elevated BNP.  His renal ultrasound at that time was within normal limits and he was treated with IV Lasix and transition to p.o. on discharge home.  He returns back on 05/18/2021 with worsening respiratory distress and initially was treated as an asthma exacerbation with epinephrine by EMS found to be hypertensive with systolic blood pressure in the 180s to 834H and diastolic blood pressure 962.  ABG was done and showed respiratory acidosis with a mixed picture with saturations in the 80s.  Chest x-ray was concerning for pulmonary edema and he had significant crackles on examination.  He was admitted for acute hypoxic respiratory failure due to bilateral infiltrates with arts and pulmonary hypertension.  Patient was started on nitro drip and transition to nicardipine for systolic blood pressure 229.  At that time he was given Lasix 40 mg x 1.  PCCM was consulted for admission in the setting of respiratory distress and hypertensive emergency and he was admitted on 05/18/2018 and then on 05/20/2021 he was placed on BiPAP 18/6 with 60% FiO2.  05/21/2021 he had left IJ central venous  catheter placed.  He remained on Cardene and 05/22/2021 his Cardene drip started being down titrated and his p.o. antihypertensives were uptitrated.  His respiratory status at that time was stable on 15 L high flow nasal cannula and he still had improvement but with marginal urine output with aggressive diuresis.  On 05/23/2021 he had continued improvement in his O2 needs but remained hypertensive off Cardene and nephrology was consulted for HD/CRRT to optimize for right heart cath.  His right IJ catheter was changed out to a trialysis catheter and on 05/24/2021 he initiated CRRT and had 3.2 L removed and his O2 saturations we will wean down to 10 L and he tolerated this well.  On 05/26/2021 he subjectively felt better but his chest x-ray did not change and had elevated blood pressure despite multiple agents and remained on CRRT.  On tube 22,023 he was weaned on 2 L of nasal cannula and he was net 5.9 L and felt better but subsequently he was transferred to [facility] for right heart catheterization and initiation of intermittent hemodialysis.  Cardiology evaluated the patient and planning for a heart catheterization on Friday.  Patient's volume will has improved and his chest x-ray is better and he is down 15 to 20 kg with CRRT.  Plan is for right heart cath scheduled for Friday and patient has initiated 24-hour urine protein collection.  The recommendation is for an abdominal fat pad/bone marrow biopsy for amyloid and the PCCM PA reviewed the case with hematology and they felt that the light chains could be some degree of poor clearance  of renal failure and recommending further biopsy to rule out other disease processes but given this a myeloma panel will be sent at 24-hour urine for completeness.  May discuss with IR about a fat pad biopsy/bone marrow biopsy but will await his right heart cath first.  Patient's renal function is mildly elevated today and may be stabilizing.  Nephrology recommends no dialysis at  this time and they recommend continuing the patient on a renal diet for now.  Plan is for cardiac catheterization in the a.m but patient's volume status is improved.  Cardiology is increasing his Norvasc today and they are planning on outpatient work-up for questionable cardiac amyloidosis.  Assessment and Plan: * Acute respiratory failure ([place])- (present on admission) -PCCM Signed off  -Patient had Nodular Infiltrates on CXR/CT and was not consistent with pulmonary edema -Initially concerned with elevated PCT and because of lack of response to diuretics that could be infectious vs. Inflammatory process. -They initially felt that hydralazine induced pneumonitis was possible but he had a negative antihistone antibody. -His HIV was negative -Per pulmonary he had a similar nodular infiltrates dating back to 01/2021 -He continues to have residual infiltrates on plain films despite significant volume reduction -Autoimmune panel has been largely negative except elevated kappa lambda light chains -His ESR was 56 and his ANA was negative and RF was negative as well as ANCA being negative. -UDS was negative and RV panel was negative his protein/creatinine ratio was 1.39 and trended down to 0.40 with a negative histone antibody negative -His kappa light chain was 109.4 and his lambda was 40.6 with a ratio being 2.69 -SpO2: 90 % O2 Flow Rate (L/min): 2 L/min (Rechecked saturation now with 2 L O2) FiO2 (%): 40 % -Per pulmonary continue weaning oxygen saturations greater than 90%; remains on 2 L this morning -Follow intermittent chest x-ray repeat chest this AM showed "A right jugular catheter remains in place, terminating over the mid SVC. The cardiac silhouette remains enlarged. Interstitial and airspace opacities in the right greater than left lungs have slightly improved. No sizable pleural effusion or pneumothorax is identified."A right jugular catheter remains in place, terminating over the mid SVC. The  cardiac silhouette remains enlarged. Interstitial and airspace opacities in the right greater than left lungs have slightly improved. No sizable pleural effusion or pneumothorax is identified. -We will consider repeat CT imaging once he is dry to evaluate the parenchyma nodular infiltrates and he will get a right heart cath on Friday to evaluate his pulmonary pressures; will likely repeat noncontrast imaging after his catheterization -Fluid removal with CRRT has been completed now he is at [facility] for intermittent hemodialysis and will defer dialysis to nephrology and they feel that there is no current indication for dialysis at this time. -Continue to monitor respiratory status carefully and if necessary will place him back on nebs -We will order flutter valve and incentive spirometry -Continue to monitor blood pressures per protocol   Obesity (BMI 16-10.9) -Complicates overall prognosis and care -Estimated body mass index is 36.03 kg/m as calculated from the following:   Height as of this encounter: $RemoveBeforeD'5\' 8"'[id number]$  (1.727 m).   Weight as of this encounter: 107.5 kg.  -Weight Loss and Dietary Counseling given   Normocytic anemia - Patient's hemoglobin/hematocrit went from 9.6/31.9 and is now 9.5/31.1 yesterday and today is now 9.4/30.7 and relatively stable -Likely anemia of chronic kidney disease -Anemia panel was checked and iron level was 33, U IBC is 261, TIBC 294, saturation ratios  of 11%, ferritin level 111, folate level 11.1, vitamin B12 626 -Nephrology recommending transfusing as needed hemoglobin for hemoglobin less than 7 -Continue to monitor for signs and symptoms bleeding; currently no overt bleeding noted -Repeat CBC in a.m.   Pulmonary hypertension ([place]) -Patient is to undergo a right heart cath this Friday to evaluate his pulmonary pressures -His vasculitis panel was negative but his kappa and lambda light chains are slightly elevated -he is off of CRRT and has been  transferred to [facility] for intermittent hemodialysis -Undergoing 24-hour urine protein collection and this is complete -Once he is euvolemic pulmonary recommended repeating CT scan to evaluate his parenchyma and nodular infiltrates; will consider this after his cardiac catheterization -Fluid was removed via CRRT and will be now controlled with intermittent hemodialysis but renal recommends no dialysis at this time given that his creatinine will be bumped slightly and that his volume status is acceptable   Acute kidney injury superimposed on CKD ([place]) -See AKI  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  He is getting a myeloma panel sent out and a 24-hour urine protein. -His kappa and lambda light chains were elevated and his ratio was elevated. -We will consider obtaining a fat pad biopsy and and/or bone marrow biopsy and will need to discuss with hematology further -echo was unchanged but showed an more vigorous elevated LV function. -He has elevated Lambda Light Chains with Concern for Amyloidosis -Cardiology evaluating and he will need outpatient hematology referral and will discuss with them about next steps about possibly getting a bone marrow biopsy and abdominal fat pad biopsy while in house but will have further cardiac amyloidosis work-up in the outpatient setting -Patient is to go for right heart cath tomorrow 05/31/2021 and he will be n.p.o. after midnight -His antihistone testing was negative so his hydralazine has been resumed -Cardiology recommending continuing carvedilol 25 mg p.o. twice daily as well as Imdur 120 mg p.o. daily.  Recommending restarting amlodipine 5 mg p.o. daily if blood pressure still at goal and they are recommending starting minoxidil.  They also recommended clonidine 0.3 mg 3 times daily -Per cardiology no ACE/ARB/ARNI or mineralocorticoid  receptor antagonist given his renal function -Cardiology recommended outpatient sleep study -Further plans per cardiology    Acute on chronic diastolic CHF (congestive heart failure) ([place])- (present on admission) -See Restrictive Cardiomyopathy -GI evaluating and recommending continuing Coreg 25 mg p.o. twice daily, Imdur 120 g p.o. daily they have increased her Norvasc to 10 mg p.o. daily.  They are recommending no ACEi/ARB/ARNI or MRA given his renal function worsening -Plan is for cardiac cath with a right heart catheterization 05/31/2021  Uncontrolled type 2 diabetes mellitus with hyperglycemia ([place])- (present on admission) -Patient was placed on a resistant sign scale insulin before meals and at bedtime and also has meal coverage with 4 units 3 times daily -CBGs ranging from 139-177 -HbA1c 6.6 -Continue monitor CBGs per protocol and adjust insulin as necessary  Hypertensive urgency- (present on admission) -Appreciate cardiology evaluation -Off of the nitro drip and Cardene drips -Cardiology has initiated carvedilol 25 mg p.o. twice daily, Imdur 120 mg p.o. daily, clonidine 0.3 mg p.o. 3 times daily -Cardiology is also reinitiated amlodipine at 5 mg p.o. daily and increasing his Norvasc dose to 10 mg p.o. daily and recommending resuming minoxidil blood pressure still remain uncontrolled and not at goal -Patient has been removed off  of the hydralazine but since his antihistone testing is negative they discussed that it is safe for him to take the hydralazine but he refused it on 05/28/2021 -Patient get cardiology input and a right heart cath as well as some cardiac imaging at some point -Currently cardiology recommends no ACE inhibitor/ARB/ARNI or MRI given his renal function -Continue monitor blood pressures per protocol -Last Blood Pressure reading was 131/88   AKI (acute kidney injury) ([place]) -Has a history of CKD stage IIIb with a baseline creatinine 1.8-3 -His creatinine on  admission was 2.4 and peaked at 3.9 so CRRT was started on 05/23/2021 -Nephrology felt that he had a cardiorenal cause of his AKI and had some proteinuria which was quantified at 400 mg to 4 1300 mg p.o. daily approximately and they felt that this is in the nonnephrotic proteinuria. -He started on CRRT 05/23/2018 for massive volume overload and was weaned off oxygen his chest x-ray had improved but he is back on oxygen again today  -He was transferred to [facility] for intermittent hemodialysis and his CRRT was discontinued on 05/28/2021; nephrology feels no indication for hemodialysis at this point and feel that he may have some renal recovery and may be plateauing -Continue strict I's and O's and daily weights -Patient's BUNs/creatinine went from 33/1.62 -> 34/1.76 -> 38/2.32 and is now elevated slightly at 51/2.60 -Further care per nephrology and will avoid further nephrotoxic medication, consciousness, hypotension renally dose medications -Repeat CMP in a.m.    Nutrition Documentation    [facility] to Hosp-Admission (Current) from 05/18/2021 in [facility]  Nutrition Problem Increased nutrient needs  Etiology acute illness  Nutrition Goal Patient will meet greater than or equal to 90% of their needs  Interventions Ensure Enlive (each supplement provides 350kcal and 20 grams of protein)      Subjective: Seen and examined and he felt okay.  Denied chest pain or shortness of breath.  Had some mild lower extremity swelling.  No nausea or vomiting.  No other concerns or complaints this time is ready for his cardiac catheterization in the morning.  Physical Exam: Vitals:   05/30/21 0958 05/30/21 1211 05/30/21 1350 05/30/21 1644  BP: (!) 149/90 (!) 148/84 (!) 145/79 131/88  Pulse:  69  65  Resp:  18    Temp:  98.1 F (36.7 C)    TempSrc:  Oral    SpO2:  96%    Weight:      Height:       Examination: Physical Exam:  Constitutional: WN/WD obese African-American  male currently no acute distress appears calm but is wearing supplemental oxygen via nasal cannula Respiratory: Slightly diminished to auscultation bilaterally with coarse breath sounds, no wheezing, rales, rhonchi or crackles. Normal respiratory effort and patient is not tachypenic. No accessory muscle use.  Unlabored breathing and not wearing supplemental oxygen via nasal cannula Cardiovascular: RRR, no murmurs / rubs / gallops. S1 and S2 auscultated.  Has mild 1+ extremity edema Abdomen: Soft, non-tender, distended secondary body habitus. Bowel sounds positive.  GU: Deferred. Musculoskeletal: No clubbing / cyanosis of digits/nails. No joint deformity upper and lower extremities.  Has a trialysis catheter in the right side of his neck Skin: No rashes, lesions, ulcers on limited skin evaluation. No induration; Warm and dry.    Data Reviewed:  I have independently assessed and interpreted the patient's laboratory data including a CMP and CBC  Patient's BUNs/creatinine is slightly elevated from yesterday and it is now  51/2.60.  He does have a slight hyperphosphatemia with a phosphate level of 4.9.  Hemoglobin/hematocrit is relatively stable compared to yesterday is now 9.4/30.7.  He does have slight hyponatremia at 134  Family Communication: No family currently at bedside  Disposition: Status is: Inpatient Remains inpatient appropriate because: Patient is getting a right heart cath on Friday, 05/31/2021   Planned Discharge Destination: Home  DVT Prophylaxis: Heparin 5,000 units sq q8h  Author: Raiford Noble, DO [facility] 05/30/2021 6:00 PM  For on call review [email or web address].

## 2021-05-30 NOTE — Progress Notes (Signed)
Progress Note  Patient Name: Micheal Levy Date of Encounter: 05/30/2021  [facility] Cardiologist: Godfrey Pick [name], DO   Subjective   Blood pressure variable but improving. Patient notes no CP, SOB or DOE but notes limited overall activity.  Inpatient Medications    Scheduled Meds:  amLODipine  5 mg Oral Daily   B-complex with vitamin C  1 tablet Oral Daily   carvedilol  25 mg Oral BID WC   Chlorhexidine Gluconate Cloth  6 each Topical Q0600   cloNIDine  0.3 mg Oral TID   feeding supplement  237 mL Oral BID BM   heparin  5,000 Units Subcutaneous Q8H   hydrALAZINE  50 mg Oral Q8H   insulin aspart  0-20 Units Subcutaneous TID WC   insulin aspart  0-5 Units Subcutaneous QHS   insulin aspart  4 Units Subcutaneous TID WC   isosorbide mononitrate  120 mg Oral Daily   mouth rinse  15 mL Mouth Rinse BID   pantoprazole  40 mg Oral QHS   sodium chloride flush  3 mL Intravenous Q12H   Continuous Infusions:  sodium chloride Stopped (05/24/21 2225)   PRN Meds: sodium chloride, acetaminophen, docusate sodium, labetalol, polyethylene glycol, sodium chloride flush   Vital Signs    Vitals:   05/29/21 1801 05/29/21 1905 05/29/21 2228 05/30/21 0619  BP: (!) 142/88 114/70 136/79 (!) 151/93  Pulse: 67 61  64  Resp:  20  18  Temp:  97.8 F (36.6 C)  97.9 F (36.6 C)  TempSrc:  Oral  Oral  SpO2:  97%  95%  Weight:    106 kg  Height:        Intake/Output Summary (Last 24 hours) at 05/30/2021 0733 Last data filed at 05/30/2021 0600 Gross per 24 hour  Intake 180 ml  Output 500 ml  Net -320 ml   Last 3 Weights 05/30/2021 05/28/2021 05/27/2021  Weight (lbs) 233 lb 9.6 oz 236 lb 15.9 oz 243 lb 9.7 oz  Weight (kg) 105.96 kg 107.5 kg 110.5 kg      Telemetry    SR  - Personally Reviewed  ECG    No new ECG tracing today. - Personally Reviewed  Physical Exam   GEN: No distress, morbid obesity Cardiac: RRR. S1, S2. No rubs or gallops. Respiratory:  Crackles in the left lower  bases with normal effort and RR  GI: Soft, non-distended, and non-tender. MS: non-pitting edema bilatearlly No deformity. Skin: Warm. Neuro:  No focal deficits. Psych: Normal affect. Responds appropriately.  Labs    High Sensitivity Troponin:   Recent Labs  Lab 05/08/21 1634 05/08/21 1840 05/18/21 0008 05/18/21 0250 05/18/21 0606  TROPONINIHS 24* 23* 26* 48* 68*     Chemistry Recent Labs  Lab 05/27/21 0251 05/27/21 2014 05/28/21 0254 05/29/21 0303 05/30/21 0255  NA 135   < > 133* 135 135   134*  K 4.6   < > 4.6 4.8 5.0   5.0  CL 103   < > 103 100 102   101  CO2 28   < > 27 28 26   25   GLUCOSE 154*   < > 147* 145* 165*   164*  BUN 35*   < > 34* 38* 51*   51*  CREATININE 1.66*   < > 1.76* 2.32* 2.60*   2.59*  CALCIUM 8.4*   < > 8.2* 8.5* 8.8*   8.7*  MG 2.7*  --  2.6*  --  2.5*  PROT  --   --   --   --  5.8*  ALBUMIN 2.7*   < > 2.7* 2.5* 2.6*   2.6*  AST  --   --   --   --  17  ALT  --   --   --   --  55*  ALKPHOS  --   --   --   --  56  BILITOT  --   --   --   --  0.4  GFRNONAA 52*   < > 49* 35* 31*   31*  ANIONGAP 4*   < > 3* 7 7   8    < > = values in this interval not displayed.    Lipids No results for input(s): CHOL, TRIG, HDL, LABVLDL, LDLCALC, CHOLHDL in the last 168 hours.  Hematology Recent Labs  Lab 05/28/21 0254 05/29/21 0303 05/30/21 0255  WBC 9.0 7.5 7.6  RBC 3.70* 3.72* 3.72*   3.65*  HGB 9.6* 9.5* 9.4*  HCT 31.9* 31.1* 30.7*  MCV 86.2 83.6 82.5  MCH 25.9* 25.5* 25.3*  MCHC 30.1 30.5 30.6  RDW 18.6* 18.5* 18.6*  PLT 173 169 159   Thyroid No results for input(s): TSH, FREET4 in the last 168 hours.  BNPNo results for input(s): BNP, PROBNP in the last 168 hours.  DDimer No results for input(s): DDIMER in the last 168 hours.   Radiology    No results found.  Cardiac Studies   Lower Extremity Venous Doppler 05/08/2021: Summary: RIGHT:  - There is no evidence of deep vein thrombosis in the lower extremity.  - There is no evidence of  superficial venous thrombosis.  - No cystic structure found in the popliteal fossa.  subcutaneous edema noted.     LEFT:  - No evidence of common femoral vein obstruction.  _______________   Echocardiogram 05/18/2021: Impressions: 1. Left ventricular ejection fraction, by estimation, is 70 to 75%. The  left ventricle has hyperdynamic function. The left ventricle has no  regional wall motion abnormalities. There is mild concentric left  ventricular hypertrophy. Left ventricular  diastolic parameters are indeterminate. Elevated left ventricular  end-diastolic pressure. The average left ventricular global longitudinal  strain is -24.8 %. The global longitudinal strain is normal.   2. Right ventricular systolic function is normal. The right ventricular  size is mildly enlarged. There is moderately elevated pulmonary artery  systolic pressure. The estimated right ventricular systolic pressure is  123456 mmHg.   3. The mitral valve is normal in structure. No evidence of mitral valve  regurgitation. No evidence of mitral stenosis.   4. The aortic valve is normal in structure. Aortic valve regurgitation is  not visualized. No aortic stenosis is present.   5. Aortic dilatation noted. There is mild dilatation of the ascending  aorta, measuring 38 mm.   6. The inferior vena cava is dilated in size with <50% respiratory  variability, suggesting right atrial pressure of 15 mmHg.  _______________  Patient Profile     41 y.o. male with a history of diastolic CHF, hypertension, type 2 diabetes mellitus, CKD stage III-IV , stroke, seizure disorder, and substance abuse in remission who is being seen 05/27/2021 for the evaluation of CHF at the request of Dr. Valeta Harms.    Assessment & Plan    Acute Hypoxic Respiratory Failure Secondary to ARDS Restrictive Cardiomyopathy (acute on Chronic Diastolic HF Elevated light chains- query or AL amyloid WHO Group II PH AKI on CKD stage IV, ESRD  DM with  CKD Morbid Obesity Prior substance abuse - hyperolvemic volume status - clinical suspicion for HTN heart disease; initial consult was for query of cardiac amyloidosis (outpatient work up described in prior notes) - no evidence of RAS - outpatient sleep apnea testing recommended - Anti-histone testing negative, we have discussed that it is safe for him to take hydralazine again today; he is amenable to getting the medication - Continue Coreg 25mg  twice daily. - Continue Imdur 120mg  daily. - - Increasing norvasc; if BP is still not at goal minoxidil can be restarted (would likely increase hydralazine first - Continue Clonidine 0.3mg  three times daily today. - No ACE/ARB/ARNI or MRA given renal function. - Recommend outpatient sleep study. - NPO at midnight for [place] 05/31/21 after discussion with patient, [facility], and Nephrology   For questions or updates, please contact [place] Please consult [email or web address] for contact info under      Rudean Haskell, [address] [place], [place] [address] [phone]  7:33 AM

## 2021-05-30 NOTE — Assessment & Plan Note (Addendum)
-  See Restrictive Cardiomyopathy -Cardiology consulted and recommending continuing Coreg 25 mg p.o. twice daily, Imdur 120 g p.o. daily they have increased her Norvasc to 10 mg p.o. daily.  They are recommending no ACEi/ARB/ARNI or MRA given his renal function worsening -Status post right heart catheterization 05/31/2021. - Cath showed " Ao sat 90%, PA sat 62%, PA pressure 83/37, mean PA pressure 54 mm Hg; mean PCWP 38 mm Hg; prominent V waves on wedge tracing; CO 7.9 L/min; CI 3.6. Severe systemic hypertension. Hemodynamic findings consistent with severe pulmonary hypertension. He still appears quite volume overloaded. rominent V wave on wedge tracing may represent restrictive physiology. Continue medical therapy including volume removal with dialysis."  -Further Care per Cards and IV Lasix increased to 120 mg twice daily.  Patient to be transition to oral torsemide per cardiology. -Patient is - 29.423 L during this hospitalization. -Patient be discharged on torsemide 80 mg daily in addition to other cardiac medications of Coreg, Imdur, hydralazine, Norvasc.  -Outpatient follow-up with cardiology.

## 2021-05-30 NOTE — Progress Notes (Signed)
Patient declines BIPAP. No unit in room at this time. °

## 2021-05-30 NOTE — Progress Notes (Signed)
[facility] Progress Note  Subjective: 500 cc UOP this am, creat 2.59 this am. No c/o's.   Vitals:   05/30/21 0958 05/30/21 1211 05/30/21 1350 05/30/21 1644  BP: (!) 149/90 (!) 148/84 (!) 145/79 131/88  Pulse:  69  65  Resp:  18    Temp:  98.1 F (36.7 C)    TempSrc:  Oral    SpO2:  96%    Weight:      Height:        Exam: Gen:nad, sitting up, in good spirits CVS:rrr, s1s2 Resp: normal WOB OBS:JGGEZ, soft Ext: 1+ pitting edema b/l le's Neuro: awake, alert, speech clear and coherent Dialysis access: RIJ trialysis catheter     Summary: 41 yo w/ hx of HTN, DM, CKD b/l creat 1.8- 3 admitted for resp distress. Had recent admit for decomp diast CHF and AKI/ CKD from 2/1- 2/4 rx'd w/ diuresis then dc'd home. Returned on 2/11 w/ resp distress, uncont HTN and hypoxemia. CXR showed pulm edema, started on IV cardene and IV lasix. Poor response to IV lasix so started on CRRT 2/16 for volume reduction. Needs RHC at [facility] when stable.       Date   Creat  eGFR   2017   1.23   Sept 2018  1.32   Dec 2018  2.86 >> 1.30  AKI   2019   1.22- 1.30   2021   1.59- 2.75   Oct 2022  2.92 >> 2.28   AKI episode   Nov 2022  1.68- 2.08 Apr 2021  1.80- 2.73 29- 37, IIIb   Feb 1- 4, 2023 3.35 >> 3.03   Feb 11  2.63  Admit   Feb 16  3.93  CRRT started   Feb 21  1.76  CRRT dc'd      May 29, 2021  2.32      ECHO 2/11 - LVEF 70- 75%, mild LVH, indeterminate diast parameters   Assessment/ Plan: AKI on CKD 3 - b/l creat 1.80- 2.73, eGFR 29- 37 from Jan 2023. Creat here 2.4 on admit, peaked at 3.9 on 2/16. Hx of prior admits for severe uncont HTN, seizures/ CVA, AHRF/ PNA/ diast CHF. Had some proteinuria quantitated to $RemoveBefore'400mg'[id number]$  - $'1300mg'd$  per day approximately which is non-nephrotic proteinuria. Suspect renal failure primarily d/t chronic uncont HTN +/-  cardiorenal/ diast HF component. Started CRRT 2/16 for severe vol overload. Weaned off O2 after approx 15 kg down. CRRT dc'd 2/21 and pt  moved to [facility] for [facility]HD. Labs/ vol remain okay, no indication for HD yet. Hopefully will have some baseline renal function. Will follow.  Vol overload - much better, wt's down 20kg from peak and 13 kg from admit  Hyperkalemia - resolved, keep on renal diet for now.  AHRF - PNA and/or pulm edema. SP CRRT and 7d IV cefepime. Better. HTN urgency - sp Cardene gtt initially, getting po coreg and clonidine now Anemia - Hb 9- 10 range, normal mcv, tx prn Hb <7 DM 2     Micheal Levy 05/30/2021, 5:00 PM   Recent Labs  Lab 05/29/21 0303 05/30/21 0255  K 4.8 5.0   5.0  BUN 38* 51*   51*  CREATININE 2.32* 2.60*   2.59*  ALBUMIN 2.5* 2.6*   2.6*  CALCIUM 8.5* 8.8*   8.7*  PHOS 4.3 4.9*   4.7*  HGB 9.5* 9.4*    Inpatient medications:  amLODipine  10 mg Oral Daily  B-complex with vitamin C  1 tablet Oral Daily   carvedilol  25 mg Oral BID WC   Chlorhexidine Gluconate Cloth  6 each Topical Q0600   cloNIDine  0.3 mg Oral TID   feeding supplement  237 mL Oral BID BM   heparin  5,000 Units Subcutaneous Q8H   hydrALAZINE  50 mg Oral Q8H   insulin aspart  0-20 Units Subcutaneous TID WC   insulin aspart  0-5 Units Subcutaneous QHS   insulin aspart  4 Units Subcutaneous TID WC   isosorbide mononitrate  120 mg Oral Daily   mouth rinse  15 mL Mouth Rinse BID   pantoprazole  40 mg Oral QHS   sodium chloride flush  3 mL Intravenous Q12H    sodium chloride Stopped (05/24/21 2225)   sodium chloride, acetaminophen, docusate sodium, labetalol, polyethylene glycol, sodium chloride flush

## 2021-05-31 ENCOUNTER — Encounter (HOSPITAL_COMMUNITY)
Admission: EM | Disposition: A | Discharge status: Home / Self Care | Payer: Self-pay | Source: Home / Self Care | Attending: Internal Medicine

## 2021-05-31 DIAGNOSIS — I5033 Acute on chronic diastolic (congestive) heart failure: Secondary | ICD-10-CM | POA: Diagnosis not present

## 2021-05-31 DIAGNOSIS — I160 Hypertensive urgency: Secondary | ICD-10-CM | POA: Diagnosis not present

## 2021-05-31 DIAGNOSIS — N179 Acute kidney failure, unspecified: Secondary | ICD-10-CM | POA: Diagnosis not present

## 2021-05-31 DIAGNOSIS — I5041 Acute combined systolic (congestive) and diastolic (congestive) heart failure: Secondary | ICD-10-CM

## 2021-05-31 DIAGNOSIS — J9601 Acute respiratory failure with hypoxia: Secondary | ICD-10-CM | POA: Diagnosis not present

## 2021-05-31 HISTORY — PX: RIGHT HEART CATH: CATH118263

## 2021-05-31 LAB — RENAL FUNCTION PANEL
Albumin: 2.7 g/dL — ABNORMAL LOW (ref 3.5–5.0)
Anion gap: 8 (ref 5–15)
BUN: 58 mg/dL — ABNORMAL HIGH (ref 6–20)
CO2: 25 mmol/L (ref 22–32)
Calcium: 9 mg/dL (ref 8.9–10.3)
Chloride: 102 mmol/L (ref 98–111)
Creatinine, Ser: 3.12 mg/dL — ABNORMAL HIGH (ref 0.61–1.24)
GFR, Estimated: 25 mL/min — ABNORMAL LOW (ref >60)
Glucose, Bld: 153 mg/dL — ABNORMAL HIGH (ref 70–99)
Phosphorus: 4.8 mg/dL — ABNORMAL HIGH (ref 2.5–4.6)
Potassium: 5.2 mmol/L — ABNORMAL HIGH (ref 3.5–5.1)
Sodium: 135 mmol/L (ref 135–145)

## 2021-05-31 LAB — MULTIPLE MYELOMA PANEL, SERUM
Albumin SerPl Elph-Mcnc: 2.9 g/dL (ref 2.9–4.4)
Albumin/Glob SerPl: 1.1 (ref 0.7–1.7)
Alpha 1: 0.3 g/dL (ref 0.0–0.4)
Alpha2 Glob SerPl Elph-Mcnc: 0.7 g/dL (ref 0.4–1.0)
B-Globulin SerPl Elph-Mcnc: 0.8 g/dL (ref 0.7–1.3)
Gamma Glob SerPl Elph-Mcnc: 1.1 g/dL (ref 0.4–1.8)
Globulin, Total: 2.9 g/dL (ref 2.2–3.9)
IgA: 190 mg/dL (ref 90–386)
IgG (Immunoglobin G), Serum: 1281 mg/dL (ref 603–1613)
IgM (Immunoglobulin M), Srm: 35 mg/dL (ref 20–172)
Total Protein ELP: 5.8 g/dL — ABNORMAL LOW (ref 6.0–8.5)

## 2021-05-31 LAB — CBC WITH DIFFERENTIAL/PLATELET
Abs Immature Granulocytes: 0.12 10*3/uL — ABNORMAL HIGH (ref 0.00–0.07)
Basophils Absolute: 0 10*3/uL (ref 0.0–0.1)
Basophils Relative: 0 %
Eosinophils Absolute: 0.2 10*3/uL (ref 0.0–0.5)
Eosinophils Relative: 2 %
HCT: 30.4 % — ABNORMAL LOW (ref 39.0–52.0)
Hemoglobin: 9.7 g/dL — ABNORMAL LOW (ref 13.0–17.0)
Immature Granulocytes: 1 %
Lymphocytes Relative: 21 %
Lymphs Abs: 1.8 10*3/uL (ref 0.7–4.0)
MCH: 26.5 pg (ref 26.0–34.0)
MCHC: 31.9 g/dL (ref 30.0–36.0)
MCV: 83.1 fL (ref 80.0–100.0)
Monocytes Absolute: 1.2 10*3/uL — ABNORMAL HIGH (ref 0.1–1.0)
Monocytes Relative: 14 %
Neutro Abs: 5.2 10*3/uL (ref 1.7–7.7)
Neutrophils Relative %: 62 %
Platelets: 167 10*3/uL (ref 150–400)
RBC: 3.66 MIL/uL — ABNORMAL LOW (ref 4.22–5.81)
RDW: 18.5 % — ABNORMAL HIGH (ref 11.5–15.5)
WBC: 8.5 10*3/uL (ref 4.0–10.5)
nRBC: 0 % (ref 0.0–0.2)

## 2021-05-31 LAB — GLUCOSE, CAPILLARY
Glucose-Capillary: 141 mg/dL — ABNORMAL HIGH (ref 70–99)
Glucose-Capillary: 115 mg/dL — ABNORMAL HIGH (ref 70–99)
Glucose-Capillary: 108 mg/dL — ABNORMAL HIGH (ref 70–99)
Glucose-Capillary: 200 mg/dL — ABNORMAL HIGH (ref 70–99)

## 2021-05-31 LAB — MAGNESIUM: Magnesium: 2.5 mg/dL — ABNORMAL HIGH (ref 1.7–2.4)

## 2021-05-31 LAB — PHOSPHORUS: Phosphorus: 4.9 mg/dL — ABNORMAL HIGH (ref 2.5–4.6)

## 2021-05-31 SURGERY — RIGHT HEART CATH

## 2021-05-31 MED ORDER — LIDOCAINE HCL (PF) 1 % IJ SOLN
INTRAMUSCULAR | Status: AC
  Filled 2021-05-31: qty 30

## 2021-05-31 MED ORDER — SODIUM CHLORIDE 0.9% FLUSH: 3.0000 mL | INTRAVENOUS | Status: DC | PRN

## 2021-05-31 MED ORDER — SODIUM CHLORIDE 0.9% FLUSH
3.0000 mL | Freq: Two times a day (BID) | INTRAVENOUS | Status: DC
  Administered 2021-05-31 – 2021-06-02 (×4): 3 mL via INTRAVENOUS

## 2021-05-31 MED ORDER — HEPARIN (PORCINE) IN NACL 1000-0.9 UT/500ML-% IV SOLN
INTRAVENOUS | Status: DC | PRN
  Administered 2021-05-31: 500 mL

## 2021-05-31 MED ORDER — LIDOCAINE HCL (PF) 1 % IJ SOLN
INTRAMUSCULAR | Status: DC | PRN
  Administered 2021-05-31: 5 mL

## 2021-05-31 MED ORDER — LABETALOL HCL 5 MG/ML IV SOLN
INTRAVENOUS | Status: DC | PRN
  Administered 2021-05-31 (×2): 10 mg via INTRAVENOUS

## 2021-05-31 MED ORDER — HYDRALAZINE HCL 20 MG/ML IJ SOLN: 10.0000 mg | INTRAMUSCULAR | Status: AC | PRN

## 2021-05-31 MED ORDER — ACETAMINOPHEN 325 MG PO TABS: 650.0000 mg | ORAL_TABLET | ORAL | Status: DC | PRN

## 2021-05-31 MED ORDER — ONDANSETRON HCL 4 MG/2ML IJ SOLN: 4.0000 mg | Freq: Four times a day (QID) | INTRAMUSCULAR | Status: DC | PRN

## 2021-05-31 MED ORDER — LABETALOL HCL 5 MG/ML IV SOLN
INTRAVENOUS | Status: AC
Start: 2021-05-31 — End: ?
  Filled 2021-05-31: qty 4

## 2021-05-31 MED ORDER — LABETALOL HCL 5 MG/ML IV SOLN: 10.0000 mg | INTRAVENOUS | Status: AC | PRN

## 2021-05-31 MED ORDER — SODIUM CHLORIDE 0.9 % IV SOLN: 250.0000 mL | INTRAVENOUS | Status: DC | PRN

## 2021-05-31 MED ORDER — HEPARIN (PORCINE) IN NACL 1000-0.9 UT/500ML-% IV SOLN
INTRAVENOUS | Status: AC
  Filled 2021-05-31: qty 500

## 2021-05-31 SURGICAL SUPPLY — 8 items
CATH BALLN WEDGE 5F 110CM (CATHETERS) ×1 IMPLANT
KIT HEART LEFT (KITS) ×1 IMPLANT
PACK CARDIAC CATHETERIZATION (CUSTOM PROCEDURE TRAY) ×1 IMPLANT
PROTECTION STATION PRESSURIZED (MISCELLANEOUS) ×2
SHEATH GLIDE SLENDER 4/5FR (SHEATH) ×1 IMPLANT
STATION PROTECTION PRESSURIZED (MISCELLANEOUS) IMPLANT
TUBING ART PRESS 72  MALE/FEM (TUBING) ×2
TUBING ART PRESS 72 MALE/FEM (TUBING) IMPLANT

## 2021-05-31 NOTE — H&P (View-Only) (Signed)
Progress Note  Patient Name: Micheal Levy Date of Encounter: 05/31/2021  [facility] Cardiologist: Berniece Salines, DO   Subjective   Feeling well. No CP, SOB but still on O2. Asks questions about RHC. Leg swelling is best it has been in some time.  Inpatient Medications    Scheduled Meds:  amLODipine  10 mg Oral Daily   B-complex with vitamin C  1 tablet Oral Daily   carvedilol  25 mg Oral BID WC   Chlorhexidine Gluconate Cloth  6 each Topical Q0600   cloNIDine  0.3 mg Oral TID   feeding supplement  237 mL Oral BID BM   heparin  5,000 Units Subcutaneous Q8H   hydrALAZINE  50 mg Oral Q8H   insulin aspart  0-20 Units Subcutaneous TID WC   insulin aspart  0-5 Units Subcutaneous QHS   insulin aspart  4 Units Subcutaneous TID WC   isosorbide mononitrate  120 mg Oral Daily   mouth rinse  15 mL Mouth Rinse BID   pantoprazole  40 mg Oral QHS   sodium chloride flush  3 mL Intravenous Q12H   sodium chloride flush  3 mL Intravenous Q12H   Continuous Infusions:  sodium chloride Stopped (05/24/21 2225)   sodium chloride     sodium chloride 10 mL/hr at 05/31/21 0442   PRN Meds: sodium chloride, sodium chloride, acetaminophen, docusate sodium, labetalol, polyethylene glycol, sodium chloride flush, sodium chloride flush   Vital Signs    Vitals:   05/30/21 2004 05/30/21 2200 05/31/21 0358 05/31/21 0626  BP: 129/76 135/79 (!) 161/100 (!) 152/102  Pulse: 65  64   Resp: 20  19   Temp: 98 F (36.7 C)  98.1 F (36.7 C)   TempSrc: Oral  Oral   SpO2: 94%  97%   Weight:   107.8 kg   Height:        Intake/Output Summary (Last 24 hours) at 05/31/2021 0759 Last data filed at 05/31/2021 0600 Gross per 24 hour  Intake 730 ml  Output 400 ml  Net 330 ml   Last 3 Weights 05/31/2021 05/30/2021 05/28/2021  Weight (lbs) 237 lb 9.6 oz 233 lb 9.6 oz 236 lb 15.9 oz  Weight (kg) 107.775 kg 105.96 kg 107.5 kg      Telemetry    SR  - Personally Reviewed  Physical Exam   GEN: No  distress, morbid obesity Cardiac: RRR. S1, S2. No rubs or gallops. Respiratory:  Crackles in the left lower bases with normal effort and RR  GI: Soft, non-distended, and non-tender. MS: pitting edema only in R ankle,  No deformity. Skin: Warm. Neuro:  No focal deficits. Psych: Normal affect. Responds appropriately.  Labs    High Sensitivity Troponin:   Recent Labs  Lab 05/08/21 1634 05/08/21 1840 05/18/21 0008 05/18/21 0250 05/18/21 0606  TROPONINIHS 24* 23* 26* 48* 68*     Chemistry Recent Labs  Lab 05/28/21 0254 05/29/21 0303 05/30/21 0255 05/31/21 0353  NA 133* 135 135   134* 135  K 4.6 4.8 5.0   5.0 5.2*  CL 103 100 102   101 102  CO2 27 28 26   25 25   GLUCOSE 147* 145* 165*   164* 153*  BUN 34* 38* 51*   51* 58*  CREATININE 1.76* 2.32* 2.60*   2.59* 3.12*  CALCIUM 8.2* 8.5* 8.8*   8.7* 9.0  MG 2.6*  --  2.5* 2.5*  PROT  --   --  5.8*  --  ALBUMIN 2.7* 2.5* 2.6*   2.6* 2.7*  AST  --   --  17  --   ALT  --   --  55*  --   ALKPHOS  --   --  56  --   BILITOT  --   --  0.4  --   GFRNONAA 49* 35* 31*   31* 25*  ANIONGAP 3* 7 7   8 8     Lipids No results for input(s): CHOL, TRIG, HDL, LABVLDL, LDLCALC, CHOLHDL in the last 168 hours.  Hematology Recent Labs  Lab 05/29/21 0303 05/30/21 0255 05/31/21 0353  WBC 7.5 7.6 8.5  RBC 3.72* 3.72*   3.65* 3.66*  HGB 9.5* 9.4* 9.7*  HCT 31.1* 30.7* 30.4*  MCV 83.6 82.5 83.1  MCH 25.5* 25.3* 26.5  MCHC 30.5 30.6 31.9  RDW 18.5* 18.6* 18.5*  PLT 169 159 167   Thyroid No results for input(s): TSH, FREET4 in the last 168 hours.  BNPNo results for input(s): BNP, PROBNP in the last 168 hours.  DDimer No results for input(s): DDIMER in the last 168 hours.   Radiology    DG CHEST PORT 1 VIEW  Result Date: 05/30/2021 CLINICAL DATA:  Pneumonia. EXAM: PORTABLE CHEST 1 VIEW COMPARISON:  05/26/2021 FINDINGS: A right jugular catheter remains in place, terminating over the mid SVC. The cardiac silhouette remains enlarged.  Interstitial and airspace opacities in the right greater than left lungs have slightly improved. No sizable pleural effusion or pneumothorax is identified. IMPRESSION: Slight improvement of bilateral lung infiltrates. Electronically Signed   By: Logan Bores M.D.   On: 05/30/2021 08:22    Cardiac Studies   Lower Extremity Venous Doppler 05/08/2021: Summary: RIGHT:  - There is no evidence of deep vein thrombosis in the lower extremity.  - There is no evidence of superficial venous thrombosis.  - No cystic structure found in the popliteal fossa.  subcutaneous edema noted.     LEFT:  - No evidence of common femoral vein obstruction.  _______________   Echocardiogram 05/18/2021: Impressions: 1. Left ventricular ejection fraction, by estimation, is 70 to 75%. The  left ventricle has hyperdynamic function. The left ventricle has no  regional wall motion abnormalities. There is mild concentric left  ventricular hypertrophy. Left ventricular  diastolic parameters are indeterminate. Elevated left ventricular  end-diastolic pressure. The average left ventricular global longitudinal  strain is -24.8 %. The global longitudinal strain is normal.   2. Right ventricular systolic function is normal. The right ventricular  size is mildly enlarged. There is moderately elevated pulmonary artery  systolic pressure. The estimated right ventricular systolic pressure is  123456 mmHg.   3. The mitral valve is normal in structure. No evidence of mitral valve  regurgitation. No evidence of mitral stenosis.   4. The aortic valve is normal in structure. Aortic valve regurgitation is  not visualized. No aortic stenosis is present.   5. Aortic dilatation noted. There is mild dilatation of the ascending  aorta, measuring 38 mm.   6. The inferior vena cava is dilated in size with <50% respiratory  variability, suggesting right atrial pressure of 15 mmHg.  _______________  Patient Profile     41 y.o. male with a  history of diastolic CHF, hypertension, type 2 diabetes mellitus, CKD stage III-IV , stroke, seizure disorder, and substance abuse in remission who is being seen 05/27/2021 for the evaluation of CHF at the request of Dr. Valeta Harms.   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 - Continue Imdur 120mg  daily. - On Norvasc 10 and hydralazine which could be increased - No contraindications to minoxidil which was tolerated well - Continue Clonidine 0.3mg  three times daily today. - NPO for RHC today; discussed with procedure with patient  For questions or updates, please contact [facility] Please consult [email or web address] for contact info under      Rudean Haskell, [place], [address] [place], [place] [address] [phone]  7:59 AM

## 2021-05-31 NOTE — Progress Notes (Signed)
[facility] Progress Note  Subjective: creat up 3.10, pt w/o complaints, no SOB or leg selling.   Vitals:   05/31/21 0851 05/31/21 1100 05/31/21 1403 05/31/21 1442  BP: (!) 171/101 (!) 161/103  (!) 158/103  Pulse: 65 66  71  Resp:    18  Temp:  (!) 97.3 F (36.3 C)  97.9 F (36.6 C)  TempSrc:  Oral  Oral  SpO2: 96% 95% (!) 89% 90%  Weight:      Height:        Exam: Gen:nad, sitting up, in good spirits CVS:rrr, s1s2 Resp: normal WOB EGB:TDVVO, soft Ext: 1+ pitting edema b/l le's Neuro: awake, alert, speech clear and coherent Dialysis access: RIJ trialysis catheter     Summary: 41 yo w/ hx of HTN, DM, CKD b/l creat 1.8- 3 admitted for resp distress. Had recent admit for decomp diast CHF and AKI/ CKD from 2/1- 2/4 rx'd w/ diuresis then dc'd home. Returned on 2/11 w/ resp distress, uncont HTN and hypoxemia. CXR showed pulm edema, started on IV cardene and IV lasix. Poor response to IV lasix so started on CRRT 2/16 for volume reduction. Needs RHC at [facility] when stable.       Date   Creat  eGFR   2017   1.23   Sept 2018  1.32   Dec 2018  2.86 >> 1.30  AKI   2019   1.22- 1.30   2021   1.59- 2.75   Oct 2022  2.92 >> 2.28   AKI episode   Nov 2022  1.68- 2.08 Apr 2021  1.80- 2.73 29- 37, IIIb   Feb 1- 4, 2023 3.35 >> 3.03   Feb 11  2.63  Admit   Feb 16  3.93  CRRT started   Feb 21  1.76  CRRT dc'd      May 29, 2021  2.32      ECHO 2/11 - LVEF 70- 75%, mild LVH, indeterminate diast parameters   Assessment/ Plan: AKI on CKD 3 - b/l creat 1.80- 2.73, eGFR 29- 37 from Jan 2023. Creat here 2.4 on admit, peaked at 3.9 on 2/16. Hx of prior admits for severe uncont HTN, seizures/ CVA, AHRF/ PNA/ diast CHF. Had some proteinuria quantitated to 49m - 13065mper day approximately which is non-nephrotic proteinuria. Suspect renal failure primarily d/t chronic uncont HTN +/-  cardiorenal/ diast HF component. Started CRRT 2/16 for severe vol overload. Weaned off O2 after  approx 15 kg down. [facility] Labs/ vol remain okay, no indication for HD. Hopefully will have some return of renal function. Follow labs/ exam daily.  Vol overload - wt's down 20kg from peak and 13 kg from admit  Hyperkalemia - resolved, keep on renal diet for now.  AHRF - PNA and/or pulm edema on admission. Improved SP CRRT and 7d IV cefepime. HTN urgency - sp Cardene gtt initially. Now on norvasc, coreg, clonidine and hydralazine. Has tolerated minoxidil in the past per cardiology notes.  Anemia - Hb 9- 10 range, normal mcv, tx prn Hb <7 DM 2     Rob Kobe Jansma 05/31/2021, 3:11 PM   Recent Labs  Lab 05/30/21 0255 05/31/21 0353  K 5.0   5.0 5.2*  BUN 51*   51* 58*  CREATININE 2.60*   2.59* 3.12*  ALBUMIN 2.6*   2.6* 2.7*  CALCIUM 8.8*   8.7* 9.0  PHOS 4.9*   4.7* 4.8*  4.9*  HGB 9.4* 9.7*    Inpatient medications:  amLODipine  10 mg Oral Daily   B-complex with vitamin C  1 tablet Oral Daily   carvedilol  25 mg Oral BID WC   Chlorhexidine Gluconate Cloth  6 each Topical Q0600   cloNIDine  0.3 mg Oral TID   feeding supplement  237 mL Oral BID BM   heparin  5,000 Units Subcutaneous Q8H   hydrALAZINE  50 mg Oral Q8H   insulin aspart  0-20 Units Subcutaneous TID WC   insulin aspart  0-5 Units Subcutaneous QHS   insulin aspart  4 Units Subcutaneous TID WC   isosorbide mononitrate  120 mg Oral Daily   mouth rinse  15 mL Mouth Rinse BID   pantoprazole  40 mg Oral QHS   sodium chloride flush  3 mL Intravenous Q12H   sodium chloride flush  3 mL Intravenous Q12H    sodium chloride Stopped (05/24/21 2225)   sodium chloride     sodium chloride, sodium chloride, acetaminophen, docusate sodium, hydrALAZINE, labetalol, labetalol, ondansetron (ZOFRAN) IV, polyethylene glycol, sodium chloride flush, sodium chloride flush

## 2021-05-31 NOTE — Progress Notes (Signed)
Progress Note   Patient: Micheal Levy:[id number] DOB: 07/29/1980 DOA: 05/18/2021     13 DOS: the patient was seen and examined on 05/31/2021   Brief hospital course: The patient is a 41 year old obese African-American male with a past medical history significant for but limited to diabetes mellitus type 2, hyperlipidemia, hypertension, history of CKD stage IIIa with baseline creatinine around 3, chronic diastolic CHF with last EF of 55 to 60% and grade 3 diastolic dysfunction noted on echo back in October 2022, history of substance abuse as well as other comorbidities who presented to the [name] ED on 05/18/2021 for respiratory distress.  He was recently hospitalized from 200-4 2023 for acute on chronic diastolic CHF acute on chronic kidney disease and at that time presented with dyspnea, PND, worsening pedal edema and elevated BNP.  His renal ultrasound at that time was within normal limits and he was treated with IV Lasix and transition to p.o. on discharge home.  He returns back on 05/18/2021 with worsening respiratory distress and initially was treated as an asthma exacerbation with epinephrine by EMS found to be hypertensive with systolic blood pressure in the 180s to 811B and diastolic blood pressure 147.  ABG was done and showed respiratory acidosis with a mixed picture with saturations in the 80s.  Chest x-ray was concerning for pulmonary edema and he had significant crackles on examination.  He was admitted for acute hypoxic respiratory failure due to bilateral infiltrates with arts and pulmonary hypertension.  Patient was started on nitro drip and transition to nicardipine for systolic blood pressure 829.  At that time he was given Lasix 40 mg x 1.  PCCM was consulted for admission in the setting of respiratory distress and hypertensive emergency and he was admitted on 05/18/2018 and then on 05/20/2021 he was placed on BiPAP 18/6 with 60% FiO2.  05/21/2021 he had left IJ central venous  catheter placed.  He remained on Cardene and 05/22/2021 his Cardene drip started being down titrated and his p.o. antihypertensives were uptitrated.  His respiratory status at that time was stable on 15 L high flow nasal cannula and he still had improvement but with marginal urine output with aggressive diuresis.  On 05/23/2021 he had continued improvement in his O2 needs but remained hypertensive off Cardene and nephrology was consulted for HD/CRRT to optimize for right heart cath.  His right IJ catheter was changed out to a trialysis catheter and on 05/24/2021 he initiated CRRT and had 3.2 L removed and his O2 saturations we will wean down to 10 L and he tolerated this well.  On 05/26/2021 he subjectively felt better but his chest x-ray did not change and had elevated blood pressure despite multiple agents and remained on CRRT.  On tube 22,023 he was weaned on 2 L of nasal cannula and he was net 5.9 L and felt better but subsequently he was transferred to [facility] for right heart catheterization and initiation of intermittent hemodialysis.  Cardiology evaluated the patient and planning for a heart catheterization on Friday.  Patient's volume will has improved and his chest x-ray is better and he is down 15 to 20 kg with CRRT.  Plan is for right heart cath scheduled for Friday and patient has initiated 24-hour urine protein collection.  The recommendation is for an abdominal fat pad/bone marrow biopsy for amyloid and the PCCM PA reviewed the case with hematology and they felt that the light chains could be some degree of poor clearance  of renal failure and recommending further biopsy to rule out other disease processes but given this a myeloma panel will be sent at 24-hour urine for completeness.  May discuss with IR about a fat pad biopsy/bone marrow biopsy but will await his right heart cath first.  Patient's renal function is mildly elevated today and may be stabilizing.  Nephrology recommends no dialysis at  this time and they recommend continuing the patient on a renal diet for now.  Plan is for cardiac catheterization in the a.m but patient's volume status is improved.  Cardiology is increasing his Norvasc today and they are planning on outpatient work-up for questionable cardiac amyloidosis.  Patient is undergoing cardiac cath today on 05/31/2021.  Renal function continues to climb slowly so will need to monitor carefully  Assessment and Plan: * Acute respiratory failure ([place])- (present on admission) -PCCM Signed off  -Patient had Nodular Infiltrates on CXR/CT and was not consistent with pulmonary edema -Initially concerned with elevated PCT and because of lack of response to diuretics that could be infectious vs. Inflammatory process. -They initially felt that hydralazine induced pneumonitis was possible but he had a negative antihistone antibody. -His HIV was negative -Per pulmonary he had a similar nodular infiltrates dating back to 01/2021 -He continues to have residual infiltrates on plain films despite significant volume reduction -Autoimmune panel has been largely negative except elevated kappa lambda light chains -His ESR was 56 and his ANA was negative and RF was negative as well as ANCA being negative. -UDS was negative and RV panel was negative his protein/creatinine ratio was 1.39 and trended down to 0.40 with a negative histone antibody negative -His kappa light chain was 109.4 and his lambda was 40.6 with a ratio being 2.69 -SpO2: 90 % O2 Flow Rate (L/min): 2 L/min (Rechecked saturation now with 2 L O2) FiO2 (%): 40 % -Per pulmonary continue weaning oxygen saturations greater than 90%; remains on 2 L this morning again -Follow intermittent chest x-ray repeat chest this AM showed "A right jugular catheter remains in place, terminating over the mid SVC. The cardiac silhouette remains enlarged. Interstitial and airspace opacities in the right greater than left lungs have slightly  improved. No sizable pleural effusion or pneumothorax is identified."A right jugular catheter remains in place, terminating over the mid SVC. The cardiac silhouette remains enlarged. Interstitial and airspace opacities in the right greater than left lungs have slightly improved. No sizable pleural effusion or pneumothorax is identified. -We will consider repeat CT imaging once he is dry to evaluate the parenchyma nodular infiltrates and he will get a right heart cath on Friday to evaluate his pulmonary pressures; will likely repeat noncontrast imaging after his catheterization -Fluid removal with CRRT has been completed now he is at [facility] for intermittent hemodialysis and will defer dialysis to nephrology and they feel that there is no current indication for dialysis at this time. -Continue to monitor respiratory status carefully and if necessary will place him back on nebs -We will order flutter valve and incentive spirometry -Continue to monitor blood pressures per protocol   Obesity (BMI 54-56.2) -Complicates overall prognosis and care -Estimated body mass index is 36.03 kg/m as calculated from the following:   Height as of this encounter: $RemoveBeforeD'5\' 8"'[id number]$  (1.727 m).   Weight as of this encounter: 107.5 kg.  -Weight Loss and Dietary Counseling given   Normocytic anemia - Patient's hemoglobin/hematocrit is relatively stable and is 9.7/30.4 -Likely anemia of chronic kidney disease -Anemia panel was checked  and iron level was 33, U IBC is 261, TIBC 294, saturation ratios of 11%, ferritin level 111, folate level 11.1, vitamin B12 626 -Nephrology recommending transfusing as needed hemoglobin for hemoglobin less than 7 -Continue to monitor for signs and symptoms bleeding; currently no overt bleeding noted -Repeat CBC in a.m.   Pulmonary hypertension ([place]) -Patient is to undergo a right heart cath today to evaluate his pulmonary pressures -His vasculitis panel was negative but his kappa and  lambda light chains are slightly elevated -he is off of CRRT and has been transferred to [facility] for intermittent hemodialysis -Undergoing 24-hour urine protein collection and this is complete -Once he is euvolemic pulmonary recommended repeating CT scan to evaluate his parenchyma and nodular infiltrates; will consider this after his cardiac catheterization -Fluid was removed via CRRT and will be now controlled with intermittent hemodialysis but renal recommends no dialysis at this time; Continue to Monitor Renal Fxn carefully   Acute kidney injury superimposed on CKD ([place]) -See AKI  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  He is getting a myeloma panel sent out and a 24-hour urine protein. -His kappa and lambda light chains were elevated and his ratio was elevated. -We will consider obtaining a fat pad biopsy and and/or bone marrow biopsy and will need to discuss with hematology further -echo was unchanged but showed an more vigorous elevated LV function. -He has elevated Lambda Light Chains with Concern for Amyloidosis -Cardiology evaluating and he will need outpatient hematology referral and will discuss with them about next steps about possibly getting a bone marrow biopsy and abdominal fat pad biopsy while in house but will have further cardiac amyloidosis work-up in the outpatient setting -Patient is to go for right heart cath tomorrow 05/31/2021 and he will be n.p.o. after midnight -His antihistone testing was negative so his hydralazine has been resumed -Cardiology recommending continuing carvedilol 25 mg p.o. twice daily as well as Imdur 120 mg p.o. daily.  Recommending restarting amlodipine 5 mg p.o. daily if blood pressure still at goal and they are recommending starting minoxidil.  They also recommended clonidine 0.3 mg 3 times daily -Per cardiology no ACE/ARB/ARNI  or mineralocorticoid receptor antagonist given his renal function -Cardiology recommended outpatient sleep study -Further plans per cardiology    Acute on chronic diastolic CHF (congestive heart failure) ([place])- (present on admission) -See Restrictive Cardiomyopathy -GI evaluating and recommending continuing Coreg 25 mg p.o. twice daily, Imdur 120 g p.o. daily they have increased her Norvasc to 10 mg p.o. daily.  They are recommending no ACEi/ARB/ARNI or MRA given his renal function worsening -Plan is for cardiac cath with a right heart catheterization 05/31/2021 and this was done today; Cath showed " Ao sat 90%, PA sat 62%, PA pressure 83/37, mean PA pressure 54 mm Hg; mean PCWP 38 mm Hg; prominent V waves on wedge tracing; CO 7.9 L/min; CI 3.6. Severe systemic hypertension. Hemodynamic findings consistent with severe pulmonary hypertension. He still appears quite volume overloaded. rominent V wave on wedge tracing may represent restrictive physiology. Continue medical therapy including volume removal with dialysis."  -Further Care per Cards   Uncontrolled type 2 diabetes mellitus with hyperglycemia ([place])- (present on admission) -Patient was placed on a resistant sign scale insulin before meals and at bedtime and also has meal coverage with 4 units 3 times daily -CBGs ranging from 108-141 -HbA1c 6.6 -Continue monitor CBGs per  protocol and adjust insulin as necessary  Hypertensive urgency- (present on admission) -Appreciate cardiology evaluation -Off of the nitro drip and Cardene drips -Cardiology has initiated carvedilol 25 mg p.o. twice daily, Imdur 120 mg p.o. daily, clonidine 0.3 mg p.o. 3 times daily -Cardiology is also reinitiated amlodipine at 5 mg p.o. daily and increasing his Norvasc dose to 10 mg p.o. daily and recommending resuming minoxidil blood pressure still remain uncontrolled and not at goal -Patient has been removed off of the hydralazine but since his antihistone testing is  negative they discussed that it is safe for him to take the hydralazine but he refused it on 05/28/2021 -Patient get cardiology input and a right heart cath as well as some cardiac imaging at some point -Currently cardiology recommends no ACE inhibitor/ARB/ARNI or MRI given his renal function -Continue monitor blood pressures per protocol -Last Blood Pressure reading was 122/67   AKI (acute kidney injury) ([place]) -Has a history of CKD stage IIIb with a baseline creatinine 1.8-3 -His creatinine on admission was 2.4 and peaked at 3.9 so CRRT was started on 05/23/2021 -Nephrology felt that he had a cardiorenal cause of his AKI and had some proteinuria which was quantified at 400 mg to 4 1300 mg p.o. daily approximately and they felt that this is in the nonnephrotic proteinuria. -He started on CRRT 05/23/2018 for massive volume overload and was weaned off oxygen his chest x-ray had improved but he is back on oxygen again today -He was transferred to [facility] for intermittent hemodialysis and his CRRT was discontinued on 05/28/2021; nephrology feels no indication for hemodialysis at this point and feel that he may have some renal recovery and may be plateauing -Continue strict I's and O's and daily weights -Patient's BUNs/creatinine went from 33/1.62 -> 34/1.76 -> 38/2.32 and is now elevated slightly at 51/2.60 -> 58/3.12 -Further care per nephrology and will avoid further nephrotoxic medication, contrast dyes if possible, hypotension renally dose medications -Repeat CMP in a.m.    Nutrition Documentation    [place] ED to Hosp-Admission (Current) from 05/18/2021 in [facility]  Nutrition Problem Increased nutrient needs  Etiology acute illness  Nutrition Goal Patient will meet greater than or equal to 90% of their needs  Interventions Ensure Enlive (each supplement provides 350kcal and 20 grams of protein)      Subjective: Seen and examined at bedside and he was doing  okay.  Awaiting his cardiac cath.  No nausea or vomiting.  Continues to wear oxygen but denies any shortness of breath.  States that he slept fairly well.  No other concerns or complaints at this time.  Physical Exam: Vitals:   05/31/21 1100 05/31/21 1403 05/31/21 1442 05/31/21 1759  BP: (!) 161/103  (!) 158/103 122/67  Pulse: 66  71 73  Resp:   18   Temp: (!) 97.3 F (36.3 C)  97.9 F (36.6 C)   TempSrc: Oral  Oral   SpO2: 95% (!) 89% 90%   Weight:      Height:       Examination: Physical Exam:  Constitutional: WN/WD obese AAM in NAD and appears calm  Neck: Dialysis catheter on the right side of his neck Respiratory: Diminished to auscultation bilaterally with coarse breath sounds and slight crackles, no wheezing, rales, rhonchi. Normal respiratory effort and patient is not tachypenic. No accessory muscle use.  Unlabored breathing but is wearing supplemental oxygen via nasal cannula Cardiovascular: RRR, no murmurs / rubs / gallops. S1 and  S2 auscultated.  1+ lower extremity edema Abdomen: Soft, non-tender, Distended. No masses palpated. No appreciable hepatosplenomegaly. Bowel sounds positive.  GU: Deferred. Musculoskeletal: No clubbing / cyanosis of digits/nails. No joint deformity upper and lower extremities.  Data Reviewed:  I independently reviewed and interpreted the patient's laboratory data including his BMP and CBC  Patient's hemoglobin/hematocrit is relatively stable from yesterday and is now 9.7/30.4 and is potassium is trending up and is now 5.2 and is mag and Phos were elevated as well.  Renal function continues to worsen slightly and is now 58/3.12  Family Communication: Patient has family at bedside  Disposition: Status is: Inpatient Remains inpatient appropriate because: He is getting a right heart cath today and will need to continue to monitor for renal recovery  Planned Discharge Destination: Home  DVT prophylaxis: Needs heparin 5000 units subcu every  8  Author: Raiford Noble, DO [facility] 05/31/2021 7:01 PM  For on call review [email or web address].

## 2021-05-31 NOTE — TOC Progression Note (Signed)
Transition of Care [facility]) - Progression Note    Patient Details  Name: Micheal Levy MRN: [id number] Date of Birth: [birth year]  Transition of Care [facility]) CM/SW Contact  Graves-Bigelow, Lamar Laundry, RN Phone Number: 05/31/2021, 12:24 PM  Clinical Narrative:  Case Manager spoke with patient regarding disposition needs. Patient states he gets medications without issues from [place] on [address] and he has transportation to appointments. Patient asked about an outpatient sleep study- Case Manager did say cardiology can arrange vs his primary care provider. No further needs identified at this time.    Expected Discharge Plan: Home/Self Care Barriers to Discharge: Continued Medical Work up  Expected Discharge Plan and Services Expected Discharge Plan: Home/Self Care   Discharge Planning Services: CM Consult Post Acute Care Choice:  (Home) Living arrangements for the past 2 months: Single Family Home                   DME Agency: NA       HH Arranged: NA     Readmission Risk Interventions Readmission Risk Prevention Plan 05/31/2021 05/22/2021  Transportation Screening Complete Complete  Medication Review [employer]) Complete Complete  PCP or Specialist appointment within 3-5 days of discharge Complete Complete  HRI or Home Care Consult Complete Complete  SW Recovery Care/Counseling Consult Complete Complete  Palliative Care Screening Not Applicable Not Applicable  Skilled Nursing Facility Not Applicable Not Applicable  Some recent data might be hidden

## 2021-05-31 NOTE — Interval H&P Note (Signed)
History and Physical Interval Note:  05/31/2021 1:59 PM  Micheal Levy  has presented today for surgery, with the diagnosis of chest pain.  The various methods of treatment have been discussed with the patient and family. After consideration of risks, benefits and other options for treatment, the patient has consented to  Procedure(s): RIGHT HEART CATH (N/A) as a surgical intervention.  The patient's history has been reviewed, patient examined, no change in status, stable for surgery.  I have reviewed the patient's chart and labs.  Questions were answered to the patient's satisfaction.     [name]

## 2021-05-31 NOTE — Progress Notes (Signed)
Progress Note  Patient Name: Micheal Levy Date of Encounter: 05/31/2021  [facility] Cardiologist: Berniece Salines, DO   Subjective   Feeling well. No CP, SOB but still on O2. Asks questions about RHC. Leg swelling is best it has been in some time.  Inpatient Medications    Scheduled Meds:  amLODipine  10 mg Oral Daily   B-complex with vitamin C  1 tablet Oral Daily   carvedilol  25 mg Oral BID WC   Chlorhexidine Gluconate Cloth  6 each Topical Q0600   cloNIDine  0.3 mg Oral TID   feeding supplement  237 mL Oral BID BM   heparin  5,000 Units Subcutaneous Q8H   hydrALAZINE  50 mg Oral Q8H   insulin aspart  0-20 Units Subcutaneous TID WC   insulin aspart  0-5 Units Subcutaneous QHS   insulin aspart  4 Units Subcutaneous TID WC   isosorbide mononitrate  120 mg Oral Daily   mouth rinse  15 mL Mouth Rinse BID   pantoprazole  40 mg Oral QHS   sodium chloride flush  3 mL Intravenous Q12H   sodium chloride flush  3 mL Intravenous Q12H   Continuous Infusions:  sodium chloride Stopped (05/24/21 2225)   sodium chloride     sodium chloride 10 mL/hr at 05/31/21 0442   PRN Meds: sodium chloride, sodium chloride, acetaminophen, docusate sodium, labetalol, polyethylene glycol, sodium chloride flush, sodium chloride flush   Vital Signs    Vitals:   05/30/21 2004 05/30/21 2200 05/31/21 0358 05/31/21 0626  BP: 129/76 135/79 (!) 161/100 (!) 152/102  Pulse: 65  64   Resp: 20  19   Temp: 98 F (36.7 C)  98.1 F (36.7 C)   TempSrc: Oral  Oral   SpO2: 94%  97%   Weight:   107.8 kg   Height:        Intake/Output Summary (Last 24 hours) at 05/31/2021 0759 Last data filed at 05/31/2021 0600 Gross per 24 hour  Intake 730 ml  Output 400 ml  Net 330 ml   Last 3 Weights 05/31/2021 05/30/2021 05/28/2021  Weight (lbs) 237 lb 9.6 oz 233 lb 9.6 oz 236 lb 15.9 oz  Weight (kg) 107.775 kg 105.96 kg 107.5 kg      Telemetry    SR  - Personally Reviewed  Physical Exam   GEN: No  distress, morbid obesity Cardiac: RRR. S1, S2. No rubs or gallops. Respiratory:  Crackles in the left lower bases with normal effort and RR  GI: Soft, non-distended, and non-tender. MS: pitting edema only in R ankle,  No deformity. Skin: Warm. Neuro:  No focal deficits. Psych: Normal affect. Responds appropriately.  Labs    High Sensitivity Troponin:   Recent Labs  Lab 05/08/21 1634 05/08/21 1840 05/18/21 0008 05/18/21 0250 05/18/21 0606  TROPONINIHS 24* 23* 26* 48* 68*     Chemistry Recent Labs  Lab 05/28/21 0254 05/29/21 0303 05/30/21 0255 05/31/21 0353  NA 133* 135 135   134* 135  K 4.6 4.8 5.0   5.0 5.2*  CL 103 100 102   101 102  CO2 27 28 26   25 25   GLUCOSE 147* 145* 165*   164* 153*  BUN 34* 38* 51*   51* 58*  CREATININE 1.76* 2.32* 2.60*   2.59* 3.12*  CALCIUM 8.2* 8.5* 8.8*   8.7* 9.0  MG 2.6*  --  2.5* 2.5*  PROT  --   --  5.8*  --  ALBUMIN 2.7* 2.5* 2.6*   2.6* 2.7*  AST  --   --  17  --   ALT  --   --  55*  --   ALKPHOS  --   --  56  --   BILITOT  --   --  0.4  --   GFRNONAA 49* 35* 31*   31* 25*  ANIONGAP 3* 7 7   8 8     Lipids No results for input(s): CHOL, TRIG, HDL, LABVLDL, LDLCALC, CHOLHDL in the last 168 hours.  Hematology Recent Labs  Lab 05/29/21 0303 05/30/21 0255 05/31/21 0353  WBC 7.5 7.6 8.5  RBC 3.72* 3.72*   3.65* 3.66*  HGB 9.5* 9.4* 9.7*  HCT 31.1* 30.7* 30.4*  MCV 83.6 82.5 83.1  MCH 25.5* 25.3* 26.5  MCHC 30.5 30.6 31.9  RDW 18.5* 18.6* 18.5*  PLT 169 159 167   Thyroid No results for input(s): TSH, FREET4 in the last 168 hours.  BNPNo results for input(s): BNP, PROBNP in the last 168 hours.  DDimer No results for input(s): DDIMER in the last 168 hours.   Radiology    DG CHEST PORT 1 VIEW  Result Date: 05/30/2021 CLINICAL DATA:  Pneumonia. EXAM: PORTABLE CHEST 1 VIEW COMPARISON:  05/26/2021 FINDINGS: A right jugular catheter remains in place, terminating over the mid SVC. The cardiac silhouette remains enlarged.  Interstitial and airspace opacities in the right greater than left lungs have slightly improved. No sizable pleural effusion or pneumothorax is identified. IMPRESSION: Slight improvement of bilateral lung infiltrates. Electronically Signed   By: Logan Bores M.D.   On: 05/30/2021 08:22    Cardiac Studies   Lower Extremity Venous Doppler 05/08/2021: Summary: RIGHT:  - There is no evidence of deep vein thrombosis in the lower extremity.  - There is no evidence of superficial venous thrombosis.  - No cystic structure found in the popliteal fossa.  subcutaneous edema noted.     LEFT:  - No evidence of common femoral vein obstruction.  _______________   Echocardiogram 05/18/2021: Impressions: 1. Left ventricular ejection fraction, by estimation, is 70 to 75%. The  left ventricle has hyperdynamic function. The left ventricle has no  regional wall motion abnormalities. There is mild concentric left  ventricular hypertrophy. Left ventricular  diastolic parameters are indeterminate. Elevated left ventricular  end-diastolic pressure. The average left ventricular global longitudinal  strain is -24.8 %. The global longitudinal strain is normal.   2. Right ventricular systolic function is normal. The right ventricular  size is mildly enlarged. There is moderately elevated pulmonary artery  systolic pressure. The estimated right ventricular systolic pressure is  123456 mmHg.   3. The mitral valve is normal in structure. No evidence of mitral valve  regurgitation. No evidence of mitral stenosis.   4. The aortic valve is normal in structure. Aortic valve regurgitation is  not visualized. No aortic stenosis is present.   5. Aortic dilatation noted. There is mild dilatation of the ascending  aorta, measuring 38 mm.   6. The inferior vena cava is dilated in size with <50% respiratory  variability, suggesting right atrial pressure of 15 mmHg.  _______________  Patient Profile     41 y.o. male with a  history of diastolic CHF, hypertension, type 2 diabetes mellitus, CKD stage III-IV , stroke, seizure disorder, and substance abuse in remission who is being seen 05/27/2021 for the evaluation of CHF at the request of Dr. Valeta Harms.   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 - Continue Imdur 120mg  daily. - On Norvasc 10 and hydralazine which could be increased - No contraindications to minoxidil which was tolerated well - Continue Clonidine 0.3mg  three times daily today. - NPO for RHC today; discussed with procedure with patient  For questions or updates, please contact [facility] Please consult [email or web address] for contact info under      Rudean Haskell, [address] [place], [place] [address] [phone]  7:59 AM

## 2021-06-01 DIAGNOSIS — I160 Hypertensive urgency: Secondary | ICD-10-CM | POA: Diagnosis not present

## 2021-06-01 DIAGNOSIS — N179 Acute kidney failure, unspecified: Secondary | ICD-10-CM | POA: Diagnosis not present

## 2021-06-01 DIAGNOSIS — I5033 Acute on chronic diastolic (congestive) heart failure: Secondary | ICD-10-CM | POA: Diagnosis not present

## 2021-06-01 DIAGNOSIS — E875 Hyperkalemia: Secondary | ICD-10-CM

## 2021-06-01 DIAGNOSIS — J9601 Acute respiratory failure with hypoxia: Secondary | ICD-10-CM | POA: Diagnosis not present

## 2021-06-01 DIAGNOSIS — I5031 Acute diastolic (congestive) heart failure: Secondary | ICD-10-CM

## 2021-06-01 LAB — COMPREHENSIVE METABOLIC PANEL
ALT: 39 U/L (ref 0–44)
AST: 19 U/L (ref 15–41)
Albumin: 2.7 g/dL — ABNORMAL LOW (ref 3.5–5.0)
Alkaline Phosphatase: 58 U/L (ref 38–126)
Anion gap: 9 (ref 5–15)
BUN: 58 mg/dL — ABNORMAL HIGH (ref 6–20)
CO2: 23 mmol/L (ref 22–32)
Calcium: 9.2 mg/dL (ref 8.9–10.3)
Chloride: 104 mmol/L (ref 98–111)
Creatinine, Ser: 2.88 mg/dL — ABNORMAL HIGH (ref 0.61–1.24)
GFR, Estimated: 27 mL/min — ABNORMAL LOW (ref >60)
Glucose, Bld: 132 mg/dL — ABNORMAL HIGH (ref 70–99)
Potassium: 5.3 mmol/L — ABNORMAL HIGH (ref 3.5–5.1)
Sodium: 136 mmol/L (ref 135–145)
Total Bilirubin: 0.3 mg/dL (ref 0.3–1.2)
Total Protein: 6.2 g/dL — ABNORMAL LOW (ref 6.5–8.1)

## 2021-06-01 LAB — CBC WITH DIFFERENTIAL/PLATELET
Abs Immature Granulocytes: 0.12 10*3/uL — ABNORMAL HIGH (ref 0.00–0.07)
Basophils Absolute: 0 10*3/uL (ref 0.0–0.1)
Basophils Relative: 1 %
Eosinophils Absolute: 0.2 10*3/uL (ref 0.0–0.5)
Eosinophils Relative: 2 %
HCT: 29.9 % — ABNORMAL LOW (ref 39.0–52.0)
Hemoglobin: 9.1 g/dL — ABNORMAL LOW (ref 13.0–17.0)
Immature Granulocytes: 2 %
Lymphocytes Relative: 20 %
Lymphs Abs: 1.6 10*3/uL (ref 0.7–4.0)
MCH: 25.7 pg — ABNORMAL LOW (ref 26.0–34.0)
MCHC: 30.4 g/dL (ref 30.0–36.0)
MCV: 84.5 fL (ref 80.0–100.0)
Monocytes Absolute: 1 10*3/uL (ref 0.1–1.0)
Monocytes Relative: 13 %
Neutro Abs: 5 10*3/uL (ref 1.7–7.7)
Neutrophils Relative %: 62 %
Platelets: 184 10*3/uL (ref 150–400)
RBC: 3.54 MIL/uL — ABNORMAL LOW (ref 4.22–5.81)
RDW: 18.7 % — ABNORMAL HIGH (ref 11.5–15.5)
WBC: 7.9 10*3/uL (ref 4.0–10.5)
nRBC: 0 % (ref 0.0–0.2)

## 2021-06-01 LAB — GLUCOSE, CAPILLARY
Glucose-Capillary: 128 mg/dL — ABNORMAL HIGH (ref 70–99)
Glucose-Capillary: 197 mg/dL — ABNORMAL HIGH (ref 70–99)
Glucose-Capillary: 70 mg/dL (ref 70–99)
Glucose-Capillary: 157 mg/dL — ABNORMAL HIGH (ref 70–99)

## 2021-06-01 LAB — MAGNESIUM: Magnesium: 2.4 mg/dL (ref 1.7–2.4)

## 2021-06-01 LAB — PHOSPHORUS: Phosphorus: 5.4 mg/dL — ABNORMAL HIGH (ref 2.5–4.6)

## 2021-06-01 MED ORDER — FUROSEMIDE 10 MG/ML IJ SOLN
80.0000 mg | Freq: Once | INTRAMUSCULAR | Status: AC
  Administered 2021-06-01: 80 mg via INTRAVENOUS
  Filled 2021-06-01: qty 8

## 2021-06-01 MED ORDER — FUROSEMIDE 10 MG/ML IJ SOLN
80.0000 mg | Freq: Two times a day (BID) | INTRAMUSCULAR | Status: DC
  Administered 2021-06-01 – 2021-06-03 (×4): 80 mg via INTRAVENOUS
  Filled 2021-06-01 (×4): qty 8

## 2021-06-01 NOTE — Progress Notes (Signed)
Pt refuses BIPAP for tonight. States he does not wear at home.

## 2021-06-01 NOTE — Progress Notes (Signed)
[facility] Progress Note  Subjective: creat down 2.8 today, no SOB or other c/o's. RHC showed LVEDP 38 yesterday.   Vitals:   05/31/21 2003 06/01/21 0455 06/01/21 0812 06/01/21 1257  BP: (!) 141/89 (!) 152/95 (!) 161/98 (!) 154/93  Pulse: (!) 58 64 67 61  Resp: 16 16 15    Temp: 98.1 F (36.7 C) 98 F (36.7 C) 97.6 F (36.4 C)   TempSrc: Oral Oral Oral   SpO2: 90% 94% 98%   Weight:  107.4 kg    Height:  5\' 8"  (1.727 m)      Exam: Gen:nad, sitting up, in good spirits CVS:rrr, s1s2 Resp: normal WOB UQJ:FHLKT, soft Ext: 1+ pitting edema b/l le's Neuro: awake, alert, speech clear and coherent Dialysis access: RIJ trialysis catheter     Summary: 41 yo w/ hx of HTN, DM, CKD b/l creat 1.8- 3 admitted for resp distress. Had recent admit for decomp diast CHF and AKI/ CKD from 2/1- 2/4 rx'd w/ diuresis then dc'd home. Returned on 2/11 w/ resp distress, uncont HTN and hypoxemia. CXR showed pulm edema, started on IV cardene and IV lasix. Poor response to IV lasix so started on CRRT 2/16 for volume reduction. Needs RHC at [facility] when stable.       Date   Creat  eGFR   2017   1.23   Sept 2018  1.32   Dec 2018  2.86 >> 1.30  AKI   2019   1.22- 1.30   2021   1.59- 2.75   Oct 2022  2.92 >> 2.28   AKI episode   Nov 2022  1.68- 2.08 Apr 2021  1.80- 2.73 29- 37, IIIb   Feb 1- 4, 2023 3.35 >> 3.03   Feb 11  2.63  Admit   Feb 16  3.93  CRRT started   Feb 21  1.76  CRRT dc'd      May 29, 2021  2.32      ECHO 2/11 - LVEF 70- 75%, mild LVH, indeterminate diast parameters   Assessment/ Plan: AKI on CKD 3 - b/l creat 1.80- 2.73, eGFR 29- 37 from Jan 2023. Creat here 2.4 on admit, peaked at 3.9 on 2/16. Hx of prior admits for severe uncont HTN, seizures/ CVA, AHRF/ PNA/ diast CHF. Had some proteinuria quantitated to 400mg  - 1300mg  per day approximately which is non-nephrotic proteinuria. Suspect renal failure primarily d/t chronic uncont HTN +/-  cardiorenal/ diast HF  component. Started CRRT 2/16 for severe vol overload. Weaned off O2 after approx 15 kg down. CRRT dc'd 2/21 and pt moved to [facility] for iHD. Labs/ vol remain okay, no indication for HD. Will dc temp HD cath. If needs further dialysis, will plan tunneled HD cath. Needs lasix IV (^^LVEDP) per cardiology, starting at 80 mg IV bid, titrate prn.  Vol overload - wt's down 20kg from peak and 13 kg from admit. LVEDP very high on RHC, IV lasix starting as above.  Hyperkalemia - keep on renal diet for now.  AHRF - PNA and/or pulm edema on admission. Improved SP CRRT and 7d IV cefepime. HTN urgency - sp Cardene gtt initially. Now on norvasc, coreg, clonidine and hydralazine. Has tolerated minoxidil in the past per cardiology notes. BP's a bit better today.  Anemia - Hb 9- 10 range, normal mcv, tx prn Hb <7 DM 2     Rob Adreena Willits 06/01/2021, 2:19 PM   Recent Labs  Lab 05/31/21 0353 06/01/21 0424  K  5.2* 5.3*  BUN 58* 58*  CREATININE 3.12* 2.88*  ALBUMIN 2.7* 2.7*  CALCIUM 9.0 9.2  PHOS 4.8*   4.9* 5.4*  HGB 9.7* 9.1*    Inpatient medications:  amLODipine  10 mg Oral Daily   B-complex with vitamin C  1 tablet Oral Daily   carvedilol  25 mg Oral BID WC   Chlorhexidine Gluconate Cloth  6 each Topical Q0600   cloNIDine  0.3 mg Oral TID   feeding supplement  237 mL Oral BID BM   heparin  5,000 Units Subcutaneous Q8H   hydrALAZINE  50 mg Oral Q8H   insulin aspart  0-20 Units Subcutaneous TID WC   insulin aspart  0-5 Units Subcutaneous QHS   insulin aspart  4 Units Subcutaneous TID WC   isosorbide mononitrate  120 mg Oral Daily   mouth rinse  15 mL Mouth Rinse BID   pantoprazole  40 mg Oral QHS   sodium chloride flush  3 mL Intravenous Q12H   sodium chloride flush  3 mL Intravenous Q12H    sodium chloride Stopped (05/24/21 2225)   sodium chloride     sodium chloride, sodium chloride, acetaminophen, docusate sodium, labetalol, ondansetron (ZOFRAN) IV, polyethylene glycol, sodium chloride flush,  sodium chloride flush

## 2021-06-01 NOTE — Assessment & Plan Note (Addendum)
-  K+ improved from 5.3 -> 4.4 -> 4.2 -> 4.0>> 3.5>> 3.9 -Patient on IV Lasix during the hospitalization. -Hyperkalemia had resolved by day of discharge.

## 2021-06-01 NOTE — Progress Notes (Signed)
Progress Note   Patient: Micheal Levy ZOX:[id number] DOB: [birth year] DOA: 05/18/2021     14 DOS: the patient was seen and examined on 06/01/2021   Brief hospital course: The patient is a 41 year old obese African-American male with a past medical history significant for but limited to diabetes mellitus type 2, hyperlipidemia, hypertension, history of CKD stage IIIa with baseline creatinine around 3, chronic diastolic CHF with last EF of 55 to 60% and grade 3 diastolic dysfunction noted on echo back in October 2022, history of substance abuse as well as other comorbidities who presented to the [name] ED on 05/18/2021 for respiratory distress.  He was recently hospitalized from 200-4 2023 for acute on chronic diastolic CHF acute on chronic kidney disease and at that time presented with dyspnea, PND, worsening pedal edema and elevated BNP.  His renal ultrasound at that time was within normal limits and he was treated with IV Lasix and transition to p.o. on discharge home.  He returns back on 05/18/2021 with worsening respiratory distress and initially was treated as an asthma exacerbation with epinephrine by EMS found to be hypertensive with systolic blood pressure in the 180s to 811B and diastolic blood pressure 147.  ABG was done and showed respiratory acidosis with a mixed picture with saturations in the 80s.  Chest x-ray was concerning for pulmonary edema and he had significant crackles on examination.  He was admitted for acute hypoxic respiratory failure due to bilateral infiltrates with arts and pulmonary hypertension.  Patient was started on nitro drip and transition to nicardipine for systolic blood pressure 829.  At that time he was given Lasix 40 mg x 1.  PCCM was consulted for admission in the setting of respiratory distress and hypertensive emergency and he was admitted on 05/18/2018 and then on 05/20/2021 he was placed on BiPAP 18/6 with 60% FiO2.  05/21/2021 he had left IJ central venous  catheter placed.  He remained on Cardene and 05/22/2021 his Cardene drip started being down titrated and his p.o. antihypertensives were uptitrated.  His respiratory status at that time was stable on 15 L high flow nasal cannula and he still had improvement but with marginal urine output with aggressive diuresis.  On 05/23/2021 he had continued improvement in his O2 needs but remained hypertensive off Cardene and nephrology was consulted for HD/CRRT to optimize for right heart cath.  His right IJ catheter was changed out to a trialysis catheter and on 05/24/2021 he initiated CRRT and had 3.2 L removed and his O2 saturations we will wean down to 10 L and he tolerated this well.  On 05/26/2021 he subjectively felt better but his chest x-ray did not change and had elevated blood pressure despite multiple agents and remained on CRRT.  On tube 22,023 he was weaned on 2 L of nasal cannula and he was net 5.9 L and felt better but subsequently he was transferred to [facility] for right heart catheterization and initiation of intermittent hemodialysis.  Cardiology evaluated the patient and planning for a heart catheterization on Friday.  Patient's volume will has improved and his chest x-ray is better and he is down 15 to 20 kg with CRRT.  Plan is for right heart cath scheduled for Friday and patient has initiated 24-hour urine protein collection.  The recommendation is for an abdominal fat pad/bone marrow biopsy for amyloid and the PCCM PA reviewed the case with hematology and they felt that the light chains could be some degree of poor  clearance of renal failure and recommending further biopsy to rule out other disease processes but given this a myeloma panel will be sent at 24-hour urine for completeness.  May discuss with IR about a fat pad biopsy/bone marrow biopsy but will await his right heart cath first.  Patient's renal function is mildly elevated today and may be stabilizing.  Nephrology recommends no dialysis at  this time and they recommend continuing the patient on a renal diet for now.  Plan is for cardiac catheterization in the a.m but patient's volume status is improved.  Cardiology is increasing his Norvasc today and they are planning on outpatient work-up for questionable cardiac amyloidosis.  Patient is undergoing cardiac cath today on 05/31/2021.  Renal function continues to climb slowly so will need to monitor carefully  Assessment and Plan: * Acute respiratory failure ([place])- (present on admission) -PCCM Signed off  -Patient had Nodular Infiltrates on CXR/CT and was not consistent with pulmonary edema -Initially concerned with elevated PCT and because of lack of response to diuretics that could be infectious vs. Inflammatory process. -They initially felt that hydralazine induced pneumonitis was possible but he had a negative antihistone antibody. -His HIV was negative -Per pulmonary he had a similar nodular infiltrates dating back to 01/2021 -He continues to have residual infiltrates on plain films despite significant volume reduction -Autoimmune panel has been largely negative except elevated kappa lambda light chains -His ESR was 56 and his ANA was negative and RF was negative as well as ANCA being negative. -UDS was negative and RV panel was negative his protein/creatinine ratio was 1.39 and trended down to 0.40 with a negative histone antibody negative -His kappa light chain was 109.4 and his lambda was 40.6 with a ratio being 2.69 -SpO2: 98 % O2 Flow Rate (L/min): 2 L/min (Rechecked saturation now with 2 L O2) FiO2 (%): 40 % -Per pulmonary continue weaning oxygen saturations greater than 90%; remains on 2 L this morning again -Follow intermittent chest x-ray repeat chest this AM showed "A right jugular catheter remains in place, terminating over the mid SVC. The cardiac silhouette remains enlarged. Interstitial and airspace opacities in the right greater than left lungs have slightly  improved. No sizable pleural effusion or pneumothorax is identified." -We will consider repeat CT imaging once he is dry to evaluate the parenchyma nodular infiltrates and he will get a right heart cath on Friday to evaluate his pulmonary pressures; will likely repeat noncontrast imaging after his catheterization -Fluid removal with CRRT has been completed now he is at [facility] for intermittent hemodialysis and will defer dialysis to nephrology and they feel that there is no current indication for dialysis at this time. -Continue to monitor respiratory status carefully and if necessary will place him back on nebs -We will order flutter valve and incentive spirometry -Continue to monitor blood pressures per protocol   Hyperkalemia -K+ was 5.2 and now 5.3 -Getting IV Lasix per Cards and Nephro -Continue to Monitor and Trend and may need to get Lokelma.  -Continue to Monitor and Trend Renal Fxn  -Repeat CMP in the AM  Obesity (BMI 61-60.7) -Complicates overall prognosis and care -Estimated body mass index is 36.03 kg/m as calculated from the following:   Height as of this encounter: 5' 8" (1.727 m).   Weight as of this encounter: 107.5 kg.  -Weight Loss and Dietary Counseling given   Normocytic anemia - Patient's hemoglobin/hematocrit is relatively stable and is 9.7/30.4 yesterday and is now 9.1/29.9 -Likely anemia  of chronic kidney disease -Anemia panel was checked and iron level was 33, U IBC is 261, TIBC 294, saturation ratios of 11%, ferritin level 111, folate level 11.1, vitamin B12 626 -Nephrology recommending transfusing as needed hemoglobin for hemoglobin less than 7 -Continue to monitor for signs and symptoms bleeding; currently no overt bleeding noted -Repeat CBC in a.m.    Pulmonary hypertension ([place]) -Patient is to undergo a right heart cath today to evaluate his pulmonary pressures -His vasculitis panel was negative but his kappa and lambda light chains are slightly  elevated -he is off of CRRT and has been transferred to [facility] for intermittent hemodialysis -Undergoing 24-hour urine protein collection and this is complete  -Once he is euvolemic pulmonary recommended repeating CT scan to evaluate his parenchyma and nodular infiltrates; will consider this after his cardiac catheterization -Fluid was removed via CRRT and will be now controlled with intermittent hemodialysis but renal recommends no dialysis at this time and his Temp Cath is being removed; Continue to Monitor Renal Fxn carefully -Now being started on IV Lasix 80 mg BID   Acute kidney injury superimposed on CKD ([place]) -See AKI  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  He is getting a myeloma panel sent out and a 24-hour urine protein.  -SPEP had immunofixation that was normal  -His kappa and lambda light chains were elevated and his ratio was elevated.  -We will consider obtaining a fat pad biopsy and and/or bone marrow biopsy and will need to discuss with hematology further -echo was unchanged but showed an more vigorous elevated LV function. -He has elevated Lambda Light Chains with Concern for Amyloidosis -Cardiology evaluating and he will need outpatient hematology referral and will discuss with them about next steps about possibly getting a bone marrow biopsy and abdominal fat pad biopsy while in house but will have further cardiac amyloidosis work-up in the outpatient setting -Patient went for a Right heart cath tomorrow 05/31/2021 and he will be n.p.o. after midnight -His antihistone testing was negative so his hydralazine has been resumed -Cardiology is now starting IV Lasix 80 mg and now will be on IV BID -Cardiology recommending continuing carvedilol 25 mg p.o. twice daily as well as Imdur 120 mg p.o. daily.  Recommending restarting amlodipine 5 mg p.o. daily if blood  pressure still at goal and they are recommending starting minoxidil.  They also recommended clonidine 0.3 mg 3 times daily -Per cardiology no ACE/ARB/ARNI or mineralocorticoid receptor antagonist given his renal function -Cardiology recommended outpatient sleep study -Further plans per cardiology clinically suspect that he does not have amyloid and they feel that his restrictive cardiomyopathy is secondary to hypertensive heart disease and poorly controlled blood pressure over time but they feel it is not unreasonable to consider hematology consultation regarding his elevated light chains in the outpatient setting    Acute on chronic diastolic CHF (congestive heart failure) ([place])- (present on admission) -See Restrictive Cardiomyopathy -GI evaluating and recommending continuing Coreg 25 mg p.o. twice daily, Imdur 120 g p.o. daily they have increased her Norvasc to 10 mg p.o. daily.  They are recommending no ACEi/ARB/ARNI or MRA given his renal function worsening -Plan is for cardiac cath with a right heart catheterization 05/31/2021 and this was done today; Cath showed " Ao sat 90%, PA sat 62%, PA pressure 83/37, mean PA pressure 54 mm Hg; mean PCWP 38 mm Hg; prominent  V waves on wedge tracing; CO 7.9 L/min; CI 3.6. Severe systemic hypertension. Hemodynamic findings consistent with severe pulmonary hypertension. He still appears quite volume overloaded. rominent V wave on wedge tracing may represent restrictive physiology. Continue medical therapy including volume removal with dialysis."  -Further Care per Cards and will get IV Lasix per Cards and Nephrology Recc's; Received IV Lasix 80 mg x1 and is now IV BID  -Strict I's and O's and Daily Weights; Patient is -23.084 Liters   Uncontrolled type 2 diabetes mellitus with hyperglycemia ([place])- (present on admission) -Patient was placed on a resistant sign scale insulin before meals and at bedtime and also has meal coverage with 4 units 3 times daily -CBGs  ranging from 108-200  -HbA1c 6.6 -Continue monitor CBGs per protocol and adjust insulin as necessary  Hypertensive urgency- (present on admission) -Appreciate cardiology evaluation -Off of the nitro drip and Cardene drips -Cardiology has initiated carvedilol 25 mg p.o. twice daily, Imdur 120 mg p.o. daily, clonidine 0.3 mg p.o. 3 times daily -Cardiology is also reinitiated amlodipine at 5 mg p.o. daily and increasing his Norvasc dose to 10 mg p.o. daily and recommending resuming minoxidil blood pressure still remain uncontrolled and not at goal -Patient has been removed off of the hydralazine but since his antihistone testing is negative they discussed that it is safe for him to take the hydralazine but he refused it on 05/28/2021 -Patient get cardiology input and a right heart cath as well as some cardiac imaging at some point -Currently cardiology recommends no ACE inhibitor/ARB/ARNI or MRI given his renal function -Continue monitor blood pressures per protocol -Last Blood Pressure reading was 154/93   AKI (acute kidney injury) ([place]) -Has a history of CKD stage IIIb with a baseline creatinine 1.8-3 -His creatinine on admission was 2.4 and peaked at 3.9 so CRRT was started on 05/23/2021 -Nephrology felt that he had a cardiorenal cause of his AKI and had some proteinuria which was quantified at 400 mg to 4 1300 mg p.o. daily approximately and they felt that this is in the nonnephrotic proteinuria. -He started on CRRT 05/23/2018 for massive volume overload and was weaned off oxygen his chest x-ray had improved but he is back on oxygen again today -He was transferred to [facility] for intermittent hemodialysis and his CRRT was discontinued on 05/28/2021; nephrology feels no indication for hemodialysis at this point and feel that he may have some renal recovery and may be plateauing -Patient is going to get IV Lasix 80 mg IV BID -Continue strict I's and O's and daily weights -Patient's  BUNs/creatinine went from 33/1.62 -> 34/1.76 -> 38/2.32 and is now elevated slightly at 51/2.60 -> 58/3.12 -> 58/2.88 -Further care per nephrology and will avoid further nephrotoxic medication, contrast dyes if possible, hypotension renally dose medications -Repeat CMP in a.m.    Nutrition Documentation    [place] ED to Hosp-Admission (Current) from 05/18/2021 in [facility]  Nutrition Problem Increased nutrient needs  Etiology acute illness  Nutrition Goal Patient will meet greater than or equal to 90% of their needs  Interventions Ensure Enlive (each supplement provides 350kcal and 20 grams of protein)      Subjective: Seen and examined at bedside.  Still a little short of breath.  No chest pain or lightheadedness or dizziness.  No other concerns or concerns at this time.  Physical Exam: Vitals:   05/31/21 2003 06/01/21 0455 06/01/21 0812 06/01/21 1257  BP: (!) 141/89 (!) 152/95 [name])  161/98 (!) 154/93  Pulse: (!) 58 64 67 61  Resp: _0 Temp: 98.1 F (36.7 C) 98 F (36.7 C) 97.6 F (36.4 C)   TempSrc: Oral Oral Oral   SpO2: 90% 94% 98%   Weight:  107.4 kg    Height:  5' 8" (1.727 m)     Examination: Physical Exam:  Constitutional: WN/WD obese AAM in NAD and appears calm and comfortable Respiratory: Diminished to auscultation bilaterally with coarse breath sounds but has some crackles, no wheezing, rales, rhonchi. Normal respiratory effort and patient is not tachypenic. No accessory muscle use. Not wearing supplemental O2 via [place] Cardiovascular: RRR, no murmurs / rubs / gallops. S1 and S2 auscultated. 1+ LE Edema  Abdomen: Soft, non-tender, Distended 2/2 body habitus. Bowel sounds positive.  GU: Deferred. Musculoskeletal: No clubbing / cyanosis of digits/nails. No joint deformity upper and lower extremities. Has a Temp HD Catheter  Skin: No rashes, lesions, ulcers. No induration; Warm and dry.   Data Reviewed:  I have independently reviewed  and assessed the patient's clinical laboratory data including a CBC, CMP.  Patient's hemoglobin/hematocrit is now 9.1/29.9 and his BUN/Cr is now 58/2.88; his potassium now 5.3  Family Communication: No family present at bedside   Disposition: Status is: Inpatient Remains inpatient appropriate because: Needs further diuresis   Planned Discharge Destination: Home  DVT Prophylaxis: Heparin 5,000 units sq q8h  Author: Raiford Noble, DO [facility]  06/01/2021 3:34 PM  For on call review [email or web address].

## 2021-06-01 NOTE — Progress Notes (Signed)
Progress Note  Patient Name: Micheal Levy Date of Encounter: 06/01/2021  Primary Cardiologist: Berniece Salines, DO  Subjective   Feels better today.  No chest pain or shortness of breath at rest.  We reviewed the results of his right heart catheterization.  Inpatient Medications    Scheduled Meds:  amLODipine  10 mg Oral Daily   B-complex with vitamin C  1 tablet Oral Daily   carvedilol  25 mg Oral BID WC   Chlorhexidine Gluconate Cloth  6 each Topical Q0600   cloNIDine  0.3 mg Oral TID   feeding supplement  237 mL Oral BID BM   heparin  5,000 Units Subcutaneous Q8H   hydrALAZINE  50 mg Oral Q8H   insulin aspart  0-20 Units Subcutaneous TID WC   insulin aspart  0-5 Units Subcutaneous QHS   insulin aspart  4 Units Subcutaneous TID WC   isosorbide mononitrate  120 mg Oral Daily   mouth rinse  15 mL Mouth Rinse BID   pantoprazole  40 mg Oral QHS   sodium chloride flush  3 mL Intravenous Q12H   sodium chloride flush  3 mL Intravenous Q12H   Continuous Infusions:  sodium chloride Stopped (05/24/21 2225)   sodium chloride     PRN Meds: sodium chloride, sodium chloride, acetaminophen, docusate sodium, labetalol, ondansetron (ZOFRAN) IV, polyethylene glycol, sodium chloride flush, sodium chloride flush   Vital Signs    Vitals:   05/31/21 1442 05/31/21 1759 05/31/21 2003 06/01/21 0455  BP: (!) 158/103 122/67 (!) 141/89 (!) 152/95  Pulse: 71 73 (!) 58 64  Resp: 18  16 16   Temp: 97.9 F (36.6 C)  98.1 F (36.7 C) 98 F (36.7 C)  TempSrc: Oral  Oral Oral  SpO2: 90%  90% 94%  Weight:    107.4 kg  Height:    5\' 8"  (1.727 m)    Intake/Output Summary (Last 24 hours) at 06/01/2021 0811 Last data filed at 06/01/2021 0500 Gross per 24 hour  Intake 318.17 ml  Output 1350 ml  Net -1031.83 ml   Filed Weights   05/30/21 0619 05/31/21 0358 06/01/21 0455  Weight: 106 kg 107.8 kg 107.4 kg    Telemetry    Sinus rhythm.  Personally reviewed.  ECG    No ECG  reviewed.  Physical Exam   GEN: No acute distress.   Neck: No JVD. Cardiac: RRR with prominent P2, 1/6 murmur, no gallop.  Respiratory: Scattered rhonchi at bases.  Nonlabored. GI: Soft, nontender, bowel sounds present. MS: Trace ankle edema; No deformity. Neuro:  Nonfocal. Psych: Alert and oriented x 3. Normal affect.  Labs    Chemistry Recent Labs  Lab 05/30/21 0255 05/31/21 0353 06/01/21 0424  NA 135   134* 135 136  K 5.0   5.0 5.2* 5.3*  CL 102   101 102 104  CO2 26   25 25 23   GLUCOSE 165*   164* 153* 132*  BUN 51*   51* 58* 58*  CREATININE 2.60*   2.59* 3.12* 2.88*  CALCIUM 8.8*   8.7* 9.0 9.2  PROT 5.8*  --  6.2*  ALBUMIN 2.6*   2.6* 2.7* 2.7*  AST 17  --  19  ALT 55*  --  39  ALKPHOS 56  --  58  BILITOT 0.4  --  0.3  GFRNONAA 31*   31* 25* 27*  ANIONGAP 7   8 8 9      Hematology Recent Labs  Lab 05/30/21  0255 05/31/21 0353 06/01/21 0424  WBC 7.6 8.5 7.9  RBC 3.72*   3.65* 3.66* 3.54*  HGB 9.4* 9.7* 9.1*  HCT 30.7* 30.4* 29.9*  MCV 82.5 83.1 84.5  MCH 25.3* 26.5 25.7*  MCHC 30.6 31.9 30.4  RDW 18.6* 18.5* 18.7*  PLT 159 167 184    Cardiac Enzymes Recent Labs  Lab 05/08/21 1634 05/08/21 1840 05/18/21 0008 05/18/21 0250 05/18/21 0606  TROPONINIHS 24* 23* 26* 48* 63*    Radiology    CARDIAC CATHETERIZATION  Result Date: 05/31/2021   Ao sat 90%, PA sat 62%, PA pressure 83/37, mean PA pressure 54 mm Hg; mean PCWP 38 mm Hg; prominent V waves on wedge tracing; CO 7.9 L/min; CI 3.6.   Severe systemic hypertension.   Hemodynamic findings consistent with severe pulmonary hypertension. He still appears quite volume overloaded.  Prominent V wave on wedge tracing may represent restrictive physiology. Continue medical therapy including volume removal with dialysis.    Cardiac Studies   Echocardiogram 05/18/2021:  1. Left ventricular ejection fraction, by estimation, is 70 to 75%. The  left ventricle has hyperdynamic function. The left ventricle has  no  regional wall motion abnormalities. There is mild concentric left  ventricular hypertrophy. Left ventricular  diastolic parameters are indeterminate. Elevated left ventricular  end-diastolic pressure. The average left ventricular global longitudinal  strain is -24.8 %. The global longitudinal strain is normal.   2. Right ventricular systolic function is normal. The right ventricular  size is mildly enlarged. There is moderately elevated pulmonary artery  systolic pressure. The estimated right ventricular systolic pressure is  123456 mmHg.   3. The mitral valve is normal in structure. No evidence of mitral valve  regurgitation. No evidence of mitral stenosis.   4. The aortic valve is normal in structure. Aortic valve regurgitation is  not visualized. No aortic stenosis is present.   5. Aortic dilatation noted. There is mild dilatation of the ascending  aorta, measuring 38 mm.   6. The inferior vena cava is dilated in size with <50% respiratory  variability, suggesting right atrial pressure of 15 mmHg.   Assessment & Plan    1.  HFpEF with acute on chronic diastolic heart failure and associated severe pulmonary hypertension, WHO group 2 based on recent right heart catheterization.  Concurrent question of AL amyloidosis based on elevated free light chains however SPEP with immunofixation normal.  Echocardiographic findings are not overly suggestive of amyloidosis including global longitudinal strain pattern.  I discussed the case with Dr. Haroldine Laws, this may simply be an issue of restrictive cardiomyopathy with hypertensive heart disease and poorly controlled blood pressure over time.  Not unreasonable to consider hematology consultation regarding elevated light chains however.  2.  Hypertensive emergency complicating presentation with severe hypertension at baseline.  3.  Acute kidney injury on CKD stage IIIb.  Followed by nephrology and required CRRT initially for volume overload, has not  required hemodialysis at this point.  Creatinine down to 2.88 today and making urine spontaneously, net urine output of approximately 1000 cc last 24 hours.  4.  Acute hypoxic respiratory failure, initially managed for pneumonia and pulmonary edema.  5.  Substance abuse history, in remission (previously cocaine and marijuana).  Continue Norvasc, Coreg, clonidine, hydralazine, and Imdur.  Will give dose of IV Lasix today and observe urine output with follow-up creatinine tomorrow.  Needs additional optimization of fluid status prior to discharge based on his right heart catheterization.  Signed, Rozann Lesches, MD  06/01/2021, 8:11  AM

## 2021-06-02 ENCOUNTER — Inpatient Hospital Stay (HOSPITAL_COMMUNITY): Payer: Medicaid Other

## 2021-06-02 DIAGNOSIS — I5033 Acute on chronic diastolic (congestive) heart failure: Secondary | ICD-10-CM | POA: Diagnosis not present

## 2021-06-02 DIAGNOSIS — N179 Acute kidney failure, unspecified: Secondary | ICD-10-CM | POA: Diagnosis not present

## 2021-06-02 DIAGNOSIS — J9601 Acute respiratory failure with hypoxia: Secondary | ICD-10-CM | POA: Diagnosis not present

## 2021-06-02 DIAGNOSIS — I160 Hypertensive urgency: Secondary | ICD-10-CM | POA: Diagnosis not present

## 2021-06-02 LAB — GLUCOSE, CAPILLARY
Glucose-Capillary: 133 mg/dL — ABNORMAL HIGH (ref 70–99)
Glucose-Capillary: 207 mg/dL — ABNORMAL HIGH (ref 70–99)
Glucose-Capillary: 105 mg/dL — ABNORMAL HIGH (ref 70–99)
Glucose-Capillary: 139 mg/dL — ABNORMAL HIGH (ref 70–99)

## 2021-06-02 LAB — CBC WITH DIFFERENTIAL/PLATELET
Abs Immature Granulocytes: 0.06 10*3/uL (ref 0.00–0.07)
Basophils Absolute: 0 10*3/uL (ref 0.0–0.1)
Basophils Relative: 0 %
Eosinophils Absolute: 0.2 10*3/uL (ref 0.0–0.5)
Eosinophils Relative: 2 %
HCT: 30.3 % — ABNORMAL LOW (ref 39.0–52.0)
Hemoglobin: 9.4 g/dL — ABNORMAL LOW (ref 13.0–17.0)
Immature Granulocytes: 1 %
Lymphocytes Relative: 21 %
Lymphs Abs: 1.6 10*3/uL (ref 0.7–4.0)
MCH: 25.7 pg — ABNORMAL LOW (ref 26.0–34.0)
MCHC: 31 g/dL (ref 30.0–36.0)
MCV: 82.8 fL (ref 80.0–100.0)
Monocytes Absolute: 1.1 10*3/uL — ABNORMAL HIGH (ref 0.1–1.0)
Monocytes Relative: 14 %
Neutro Abs: 4.6 10*3/uL (ref 1.7–7.7)
Neutrophils Relative %: 62 %
Platelets: 187 10*3/uL (ref 150–400)
RBC: 3.66 MIL/uL — ABNORMAL LOW (ref 4.22–5.81)
RDW: 18.6 % — ABNORMAL HIGH (ref 11.5–15.5)
WBC: 7.4 10*3/uL (ref 4.0–10.5)
nRBC: 0 % (ref 0.0–0.2)

## 2021-06-02 LAB — COMPREHENSIVE METABOLIC PANEL
ALT: 35 U/L (ref 0–44)
AST: 17 U/L (ref 15–41)
Albumin: 2.9 g/dL — ABNORMAL LOW (ref 3.5–5.0)
Alkaline Phosphatase: 65 U/L (ref 38–126)
Anion gap: 9 (ref 5–15)
BUN: 63 mg/dL — ABNORMAL HIGH (ref 6–20)
CO2: 26 mmol/L (ref 22–32)
Calcium: 9.4 mg/dL (ref 8.9–10.3)
Chloride: 102 mmol/L (ref 98–111)
Creatinine, Ser: 3.02 mg/dL — ABNORMAL HIGH (ref 0.61–1.24)
GFR, Estimated: 26 mL/min — ABNORMAL LOW (ref >60)
Glucose, Bld: 167 mg/dL — ABNORMAL HIGH (ref 70–99)
Potassium: 4.4 mmol/L (ref 3.5–5.1)
Sodium: 137 mmol/L (ref 135–145)
Total Bilirubin: 0.4 mg/dL (ref 0.3–1.2)
Total Protein: 6.5 g/dL (ref 6.5–8.1)

## 2021-06-02 LAB — MAGNESIUM: Magnesium: 2.1 mg/dL (ref 1.7–2.4)

## 2021-06-02 LAB — PHOSPHORUS: Phosphorus: 5.7 mg/dL — ABNORMAL HIGH (ref 2.5–4.6)

## 2021-06-02 MED ORDER — HYDRALAZINE HCL 25 MG PO TABS
75.0000 mg | ORAL_TABLET | Freq: Three times a day (TID) | ORAL | Status: DC
Start: 2021-06-02 — End: 2021-06-08
  Administered 2021-06-02 – 2021-06-08 (×19): 75 mg via ORAL
  Filled 2021-06-02 (×19): qty 1

## 2021-06-02 NOTE — Progress Notes (Signed)
Progress Note   Patient: Micheal Levy MCN:[id number] DOB: 25-Feb-1981 DOA: 05/18/2021     15 DOS: the patient was seen and examined on 06/02/2021   Brief hospital course: The patient is a 41 year old obese African-American male with a past medical history significant for but limited to diabetes mellitus type 2, hyperlipidemia, hypertension, history of CKD stage IIIa with baseline creatinine around 3, chronic diastolic CHF with last EF of 55 to 60% and grade 3 diastolic dysfunction noted on echo back in October 2022, history of substance abuse as well as other comorbidities who presented to the [name] ED on 05/18/2021 for respiratory distress.  He was recently hospitalized from 200-4 2023 for acute on chronic diastolic CHF acute on chronic kidney disease and at that time presented with dyspnea, PND, worsening pedal edema and elevated BNP.  His renal ultrasound at that time was within normal limits and he was treated with IV Lasix and transition to p.o. on discharge home.  He returns back on 05/18/2021 with worsening respiratory distress and initially was treated as an asthma exacerbation with epinephrine by EMS found to be hypertensive with systolic blood pressure in the 180s to 629U and diastolic blood pressure 765.  ABG was done and showed respiratory acidosis with a mixed picture with saturations in the 80s.  Chest x-ray was concerning for pulmonary edema and he had significant crackles on examination.  He was admitted for acute hypoxic respiratory failure due to bilateral infiltrates with arts and pulmonary hypertension.  Patient was started on nitro drip and transition to nicardipine for systolic blood pressure 465.  At that time he was given Lasix 40 mg x 1.  PCCM was consulted for admission in the setting of respiratory distress and hypertensive emergency and he was admitted on 05/18/2018 and then on 05/20/2021 he was placed on BiPAP 18/6 with 60% FiO2.  05/21/2021 he had left IJ central venous  catheter placed.  He remained on Cardene and 05/22/2021 his Cardene drip started being down titrated and his p.o. antihypertensives were uptitrated.  His respiratory status at that time was stable on 15 L high flow nasal cannula and he still had improvement but with marginal urine output with aggressive diuresis.  On 05/23/2021 he had continued improvement in his O2 needs but remained hypertensive off Cardene and nephrology was consulted for HD/CRRT to optimize for right heart cath.  His right IJ catheter was changed out to a trialysis catheter and on 05/24/2021 he initiated CRRT and had 3.2 L removed and his O2 saturations we will wean down to 10 L and he tolerated this well.  On 05/26/2021 he subjectively felt better but his chest x-ray did not change and had elevated blood pressure despite multiple agents and remained on CRRT.  On tube 22,023 he was weaned on 2 L of nasal cannula and he was net 5.9 L and felt better but subsequently he was transferred to [facility] for right heart catheterization and initiation of intermittent hemodialysis.  Cardiology evaluated the patient and planning for a heart catheterization on Friday.  Patient's volume will has improved and his chest x-ray is better and he is down 15 to 20 kg with CRRT.  Plan is for right heart cath scheduled for Friday and patient has initiated 24-hour urine protein collection.  The recommendation is for an abdominal fat pad/bone marrow biopsy for amyloid and the PCCM PA reviewed the case with hematology and they felt that the light chains could be some degree of poor clearance  of renal failure and recommending further biopsy to rule out other disease processes but given this a myeloma panel will be sent at 24-hour urine for completeness.  May discuss with IR about a fat pad biopsy/bone marrow biopsy but will await his right heart cath first.  Patient's renal function is mildly elevated today and may be stabilizing.  Nephrology recommends no dialysis at  this time and they recommend continuing the patient on a renal diet for now.  Plan is for cardiac catheterization in the a.m but patient's volume status is improved.  Cardiology is increasing his Norvasc today and they are planning on outpatient work-up for questionable cardiac amyloidosis.  Patient underwent cardiac cath on 05/31/2021.  Renal function continued to climb slowly so will need to monitor carefully but is now improving. Cardiology trying a dose of IV Lasix and continuing monitor of renal Fxn. He is on IV Lasix 80 mg BID. Nephrology now going to D/C Temp HD Cath.  Patient is being diuresed and Cr is slightly bumped to 3.0. Cardiology recommending continuing IV Lasix today and possibly converting to po Demadex in the AM.   Assessment and Plan: * Acute respiratory failure ([place])- (present on admission) -PCCM Signed off  -Patient had Nodular Infiltrates on CXR/CT and was not consistent with pulmonary edema -Initially concerned with elevated PCT and because of lack of response to diuretics that could be infectious vs. Inflammatory process. -They initially felt that hydralazine induced pneumonitis was possible but he had a negative antihistone antibody. -His HIV was negative -Per pulmonary he had a similar nodular infiltrates dating back to 01/2021 -He continues to have residual infiltrates on plain films despite significant volume reduction -Autoimmune panel has been largely negative except elevated kappa lambda light chains -His ESR was 56 and his ANA was negative and RF was negative as well as ANCA being negative. -UDS was negative and RV panel was negative his protein/creatinine ratio was 1.39 and trended down to 0.40 with a negative histone antibody negative -His kappa light chain was 109.4 and his lambda was 40.6 with a ratio being 2.69 -SpO2: 94 % O2 Flow Rate (L/min): 2 L/min (Rechecked saturation now with 2 L O2) FiO2 (%): 40 % -Per pulmonary continue weaning oxygen saturations  greater than 90%; remains on 2 L this morning again -Follow intermittent chest x-ray repeat chest this AM showed "A right jugular catheter remains in place, terminating over the mid SVC. The cardiac silhouette remains enlarged. Interstitial and airspace opacities in the right greater than left lungs have slightly improved. No sizable pleural effusion or pneumothorax is identified." -We will consider repeat CT imaging once he is dry to evaluate the parenchyma nodular infiltrates and he will get a right heart cath on Friday to evaluate his pulmonary pressures; will likely repeat noncontrast imaging likely in the AM once he is drier -Fluid removal with CRRT has been completed now he is at [facility] for intermittent hemodialysis and will defer dialysis to nephrology and they feel that there is no current indication for dialysis at this time and now getting IV Lasix  -Continue to monitor respiratory status carefully and if necessary will place him back on nebs -We will order flutter valve and incentive spirometry -Continue to monitor blood pressures per protocol   Hyperkalemia -K+ improved from 5.3 -> 4.4 -Getting IV Lasix per Cards and Nephro -Continue to Monitor and Trend and may need to get Lokelma.  -Continue to Monitor and Trend Renal Fxn  -Repeat CMP in the  AM  Obesity (BMI 95-28.4) -Complicates overall prognosis and care -Estimated body mass index is 36.03 kg/m as calculated from the following:   Height as of this encounter: _0  (1.727 m).   Weight as of this encounter: 107.5 kg.  -Weight Loss and Dietary Counseling given   Normocytic anemia - Patient's hemoglobin/hematocrit is relatively stable and is 9.7/30.4 yesterday and is now 9.1/29.9 -> 9.4/30.3 -Likely anemia of chronic kidney disease -Anemia panel was checked and iron level was 33, U IBC is 261, TIBC 294, saturation ratios of 11%, ferritin level 111, folate level 11.1, vitamin B12 626 -Nephrology recommending transfusing as  needed hemoglobin for hemoglobin less than 7 -Continue to monitor for signs and symptoms bleeding; currently no overt bleeding noted -Repeat CBC in a.m.    Pulmonary hypertension ([place]) -Patient is to undergo a right heart cath today to evaluate his pulmonary pressures -His vasculitis panel was negative but his kappa and lambda light chains are slightly elevated -he is off of CRRT and has been transferred to [facility] for intermittent hemodialysis -Undergoing 24-hour urine protein collection and this is complete  -Once he is euvolemic pulmonary recommended repeating CT scan to evaluate his parenchyma and nodular infiltrates; will consider this after his cardiac catheterization -Fluid was removed via CRRT and will be now controlled with intermittent hemodialysis but renal recommends no dialysis at this time and his Temp Cath is being removed; Continue to Monitor Renal Fxn carefully -Now being started on IV Lasix 80 mg BID and will continue for now and transition to po Demadex   Acute kidney injury superimposed on CKD (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  He is getting a myeloma panel sent out and a 24-hour urine protein.  -SPEP had immunofixation that was normal  -His kappa and lambda light chains were elevated and his ratio was elevated.  -We will consider obtaining a fat pad biopsy and and/or bone marrow biopsy and will need to discuss with hematology further -echo was unchanged but showed an more vigorous elevated LV function. -He has elevated Lambda Light Chains with Concern for Amyloidosis -Cardiology evaluating and he will need outpatient hematology referral and will discuss with them about next steps about possibly getting a bone marrow biopsy and abdominal fat pad biopsy while in house but will have further cardiac amyloidosis work-up in the  outpatient setting -Patient went for a Right heart cath tomorrow 05/31/2021 and he will be n.p.o. after midnight -His antihistone testing was negative so his hydralazine has been resumed -Cardiology is now starting IV Lasix 80 mg and now will be on IV BID and will continue today; May change to po Demadex  -Cardiology recommending continuing carvedilol 25 mg p.o. twice daily as well as Imdur 120 mg p.o. daily.  Recommending restarting amlodipine 5 mg p.o. daily if blood pressure still at goal and they are recommending starting minoxidil.  They also recommended clonidine 0.3 mg 3 times daily -Per cardiology no ACE/ARB/ARNI or mineralocorticoid receptor antagonist given his renal function -Cardiology recommended outpatient sleep study -Further plans per cardiology clinically suspect that he does not have amyloid and they feel that his restrictive cardiomyopathy is secondary to hypertensive heart disease and poorly controlled blood pressure over time but they feel it is not unreasonable to consider hematology consultation regarding his elevated light chains in the outpatient setting    Acute on chronic  diastolic CHF (congestive heart failure) ([place])- (present on admission) -See Restrictive Cardiomyopathy -GI evaluating and recommending continuing Coreg 25 mg p.o. twice daily, Imdur 120 g p.o. daily they have increased her Norvasc to 10 mg p.o. daily.  They are recommending no ACEi/ARB/ARNI or MRA given his renal function worsening -Plan is for cardiac cath with a right heart catheterization 05/31/2021 and this was done today; Cath showed " Ao sat 90%, PA sat 62%, PA pressure 83/37, mean PA pressure 54 mm Hg; mean PCWP 38 mm Hg; prominent V waves on wedge tracing; CO 7.9 L/min; CI 3.6. Severe systemic hypertension. Hemodynamic findings consistent with severe pulmonary hypertension. He still appears quite volume overloaded. rominent V wave on wedge tracing may represent restrictive physiology. Continue medical  therapy including volume removal with dialysis."  -Further Care per Cards and will get IV Lasix per Cards and Nephrology Recc's; Received IV Lasix 80 mg x1 and is now IV BID with plans to change to po Demadex in the AM  -Strict I's and O's and Daily Weights; Patient is -26.592 Liters   Uncontrolled type 2 diabetes mellitus with hyperglycemia ([place])- (present on admission) -Patient was placed on a resistant sign scale insulin before meals and at bedtime and also has meal coverage with 4 units 3 times daily -CBGs ranging from 70-207 -HbA1c 6.6 -Continue monitor CBGs per protocol and adjust insulin as necessary  Hypertensive urgency- (present on admission) -Appreciate cardiology evaluation -Off of the nitro drip and Cardene drips -Cardiology has initiated carvedilol 25 mg p.o. twice daily, Imdur 120 mg p.o. daily, clonidine 0.3 mg p.o. 3 times daily -Cardiology is also reinitiated amlodipine at 5 mg p.o. daily and increasing his Norvasc dose to 10 mg p.o. daily and recommending resuming minoxidil blood pressure still remain uncontrolled and not at goal -Patient has been removed off of the hydralazine but since his antihistone testing is negative they discussed that it is safe for him to take the hydralazine but he refused it on 05/28/2021 -Patient get cardiology input and a right heart cath as well as some cardiac imaging at some point -Currently cardiology recommends no ACE inhibitor/ARB/ARNI or MRI given his renal function -Continue monitor blood pressures per protocol -Last Blood Pressure reading was 145/86   AKI (acute kidney injury) ([place]) -Has a history of CKD stage IIIb with a baseline creatinine 1.8-3 -His creatinine on admission was 2.4 and peaked at 3.9 so CRRT was started on 05/23/2021 -Nephrology felt that he had a cardiorenal cause of his AKI and had some proteinuria which was quantified at 400 mg to 4 1300 mg p.o. daily approximately and they felt that this is in the nonnephrotic  proteinuria. -He started on CRRT 05/23/2018 for massive volume overload and was weaned off oxygen his chest x-ray had improved but he is back on oxygen again today -He was transferred to [facility] for intermittent hemodialysis and his CRRT was discontinued on 05/28/2021; nephrology feels no indication for hemodialysis at this point and feel that he may have some renal recovery and may be plateauing -Patient is going to get IV Lasix 80 mg IV BID and will continue  -Continue strict I's and O's and daily weights -Patient's BUNs/creatinine went from 33/1.62 -> 34/1.76 -> 38/2.32 and is now elevated slightly at 51/2.60 -> 58/3.12 -> 58/2.88 -> 63/3.02 -Further care per nephrology and will avoid further nephrotoxic medication, contrast dyes if possible, hypotension renally dose medications -Repeat CMP in a.m.    Nutrition Documentation    Flowsheet Row  ED to Hosp-Admission (Current) from 05/18/2021 in [facility]  Nutrition Problem Increased nutrient needs  Etiology acute illness  Nutrition Goal Patient will meet greater than or equal to 90% of their needs  Interventions Ensure Enlive (each supplement provides 350kcal and 20 grams of protein)      Subjective: Seen and examined at bedside and he was doing okay today.  Urinating quite frequently.  No nausea or vomiting in the office supplemental oxygen.  Renal function mildly bumped but we will continue diuresis per cardiology recommendations.  Possible discharge home in the morning.  Physical Exam: Vitals:   06/02/21 0800 06/02/21 1224 06/02/21 1339 06/02/21 1759  BP: (!) 156/87 (!) 158/94 136/79 (!) 145/86  Pulse:  73 73 69  Resp:  16 16   Temp:   (!) 97.5 F (36.4 C)   TempSrc:   Oral   SpO2:   94%   Weight:      Height:       Examination: Physical Exam:  Constitutional: WN/WD obese AAM in NAD appears calm Respiratory: Diminished to auscultation bilaterally, no wheezing, rales, rhonchi or crackles. Normal  respiratory effort and patient is not tachypenic. No accessory muscle use. Unlabored breathing  Cardiovascular: RRR, no murmurs / rubs / gallops. S1 and S2 auscultated. 1+ LE Edema Abdomen: Soft, non-tender, Distended 2/2 body habitus. Bowel sounds positive.  GU: Deferred. Musculoskeletal: No clubbing / cyanosis of digits/nails. No joint deformity upper and lower extremities.  Skin: No rashes, lesions, ulcers has multiple tattoos noted. No induration; Warm and dry.   Data Reviewed:  I have independently reviewed and assessed the patient's clinical laboratory data including his CMP and CBC.  Patient CBC shows that he has a hemoglobin/hematocrit of 9.4/30.3, BUN/creatinine now at 63/3.02 with a Phos level 5.7.  Patient's hyperkalemia is improved and has gone from 5.3 is now 4.4  Family Communication: No family currently at bedside  Disposition: Status is: Inpatient Remains inpatient appropriate because: Currently getting diuresed by cardiology  Planned Discharge Destination: Home  DVT prophylaxis: Heparin 5000 units subcu  Author: Raiford Noble, DO [facility]  06/02/2021 6:56 PM  For on call review [email or web address].

## 2021-06-02 NOTE — Progress Notes (Signed)
Progress Note  Patient Name: Micheal Levy Date of Encounter: 06/02/2021  Primary Cardiologist: Godfrey Pick [name], DO  Subjective   No chest pain.  Mild shortness of breath with activity.  Inpatient Medications    Scheduled Meds:  amLODipine  10 mg Oral Daily   B-complex with vitamin C  1 tablet Oral Daily   carvedilol  25 mg Oral BID WC   Chlorhexidine Gluconate Cloth  6 each Topical Q0600   cloNIDine  0.3 mg Oral TID   feeding supplement  237 mL Oral BID BM   furosemide  80 mg Intravenous Q12H   heparin  5,000 Units Subcutaneous Q8H   hydrALAZINE  50 mg Oral Q8H   insulin aspart  0-20 Units Subcutaneous TID WC   insulin aspart  0-5 Units Subcutaneous QHS   insulin aspart  4 Units Subcutaneous TID WC   isosorbide mononitrate  120 mg Oral Daily   mouth rinse  15 mL Mouth Rinse BID   pantoprazole  40 mg Oral QHS   sodium chloride flush  3 mL Intravenous Q12H   sodium chloride flush  3 mL Intravenous Q12H   Continuous Infusions:  sodium chloride Stopped (05/24/21 2225)   sodium chloride     PRN Meds: sodium chloride, sodium chloride, acetaminophen, docusate sodium, labetalol, ondansetron (ZOFRAN) IV, polyethylene glycol, sodium chloride flush, sodium chloride flush   Vital Signs    Vitals:   06/01/21 1257 06/01/21 1756 06/01/21 2017 06/02/21 0523  BP: (!) 154/93 (!) 148/91 (!) 147/92 (!) 159/98  Pulse: 61 74 62 70  Resp:   16 16  Temp:   98.1 F (36.7 C) 98.1 F (36.7 C)  TempSrc:   Oral Oral  SpO2:      Weight:    110.3 kg  Height:        Intake/Output Summary (Last 24 hours) at 06/02/2021 0737 Last data filed at 06/02/2021 0527 Gross per 24 hour  Intake 970 ml  Output 2075 ml  Net -1105 ml   Filed Weights   05/31/21 0358 06/01/21 0455 06/02/21 0523  Weight: 107.8 kg 107.4 kg 110.3 kg    Telemetry    Sinus rhythm.  Personally reviewed.  ECG    No ECG reviewed.  Physical Exam   GEN: No acute distress.   Neck: No JVD. Cardiac: RRR with  prominent P2, 1/6 systolic murmur, no gallop.  Respiratory: Decreased breath sounds at the bases.  Nonlabored. GI: Soft, nontender, bowel sounds present. MS: Mild lower leg edema; No deformity. Neuro:  Nonfocal. Psych: Alert and oriented x 3. Normal affect.  Labs    Chemistry Recent Labs  Lab 05/30/21 0255 05/31/21 0353 06/01/21 0424 06/02/21 0235  NA 135   134* 135 136 137  K 5.0   5.0 5.2* 5.3* 4.4  CL 102   101 102 104 102  CO2 26   25 25 23 26   GLUCOSE 165*   164* 153* 132* 167*  BUN 51*   51* 58* 58* 63*  CREATININE 2.60*   2.59* 3.12* 2.88* 3.02*  CALCIUM 8.8*   8.7* 9.0 9.2 9.4  PROT 5.8*  --  6.2* 6.5  ALBUMIN 2.6*   2.6* 2.7* 2.7* 2.9*  AST 17  --  19 17  ALT 55*  --  39 35  ALKPHOS 56  --  58 65  BILITOT 0.4  --  0.3 0.4  GFRNONAA 31*   31* 25* 27* 26*  ANIONGAP 7   8 8  9  9     Hematology Recent Labs  Lab 05/31/21 0353 06/01/21 0424 06/02/21 0235  WBC 8.5 7.9 7.4  RBC 3.66* 3.54* 3.66*  HGB 9.7* 9.1* 9.4*  HCT 30.4* 29.9* 30.3*  MCV 83.1 84.5 82.8  MCH 26.5 25.7* 25.7*  MCHC 31.9 30.4 31.0  RDW 18.5* 18.7* 18.6*  PLT 167 184 187    Cardiac Enzymes Recent Labs  Lab 05/08/21 1634 05/08/21 1840 05/18/21 0008 05/18/21 0250 05/18/21 0606  TROPONINIHS 24* 23* 26* 48* 68*    Radiology    CARDIAC CATHETERIZATION  Result Date: 05/31/2021   Ao sat 90%, PA sat 62%, PA pressure 83/37, mean PA pressure 54 mm Hg; mean PCWP 38 mm Hg; prominent V waves on wedge tracing; CO 7.9 L/min; CI 3.6.   Severe systemic hypertension.   Hemodynamic findings consistent with severe pulmonary hypertension. He still appears quite volume overloaded.  Prominent V wave on wedge tracing may represent restrictive physiology. Continue medical therapy including volume removal with dialysis.    Cardiac Studies   Echocardiogram 05/18/2021:  1. Left ventricular ejection fraction, by estimation, is 70 to 75%. The  left ventricle has hyperdynamic function. The left ventricle has  no  regional wall motion abnormalities. There is mild concentric left  ventricular hypertrophy. Left ventricular  diastolic parameters are indeterminate. Elevated left ventricular  end-diastolic pressure. The average left ventricular global longitudinal  strain is -24.8 %. The global longitudinal strain is normal.   2. Right ventricular systolic function is normal. The right ventricular  size is mildly enlarged. There is moderately elevated pulmonary artery  systolic pressure. The estimated right ventricular systolic pressure is  45.2 mmHg.   3. The mitral valve is normal in structure. No evidence of mitral valve  regurgitation. No evidence of mitral stenosis.   4. The aortic valve is normal in structure. Aortic valve regurgitation is  not visualized. No aortic stenosis is present.   5. Aortic dilatation noted. There is mild dilatation of the ascending  aorta, measuring 38 mm.   6. The inferior vena cava is dilated in size with <50% respiratory  variability, suggesting right atrial pressure of 15 mmHg.   Assessment & Plan    1.  HFpEF with acute on chronic diastolic heart failure and associated severe pulmonary hypertension, WHO group 2 based on recent right heart catheterization.  Concurrent question of AL amyloidosis based on elevated free light chains however SPEP with immunofixation normal.  Echocardiographic findings are not overly suggestive of amyloidosis including global longitudinal strain pattern.  I discussed the case with Dr. Gala Romney, this may simply be an issue of restrictive cardiomyopathy with hypertensive heart disease and poorly controlled blood pressure over time.  Not unreasonable to consider hematology consultation regarding elevated light chains however.  2.  Hypertensive emergency complicating presentation with severe hypertension at baseline.  Systolics running 140s to 150s.  3.  Acute kidney injury on CKD stage IIIb.  Followed by nephrology and required CRRT  initially for volume overload, has not required hemodialysis at this point.  Creatinine 3.02 up from 2.88.  Net output of approximately 1100 cc last 24 hours on IV Lasix.  4.  Acute hypoxic respiratory failure, initially managed for pneumonia and pulmonary edema.  5.  Substance abuse history, in remission (previously cocaine and marijuana).  Continue current medications with increase in hydralazine to 75 mg every 8 hours.  Continue with present IV diuretic regimen another 24 hours, ambulate and consider potential discharge in the next 24 hours for  further outpatient management.  Need to consolidate to an oral diuretic regimen, would probably go with Demadex 80 mg once daily depending on subsequent renal function tomorrow.  Hematology work-up can be as an outpatient.  Would have TOC follow-up in the heart failure clinic most likely.  Signed, Rozann Lesches, MD  06/02/2021, 7:37 AM

## 2021-06-02 NOTE — Progress Notes (Signed)
[facility] Progress Note  Subjective: UOP 1.7 L w/ IV lasix, creat 3.0 today. Pt voiding "a lot" last night. No new c/o  Vitals:   06/01/21 2017 06/02/21 0523 06/02/21 0800 06/02/21 1224  BP: (!) 147/92 (!) 159/98 (!) 156/87 (!) 158/94  Pulse: 62 70  73  Resp: $Remo'16 16  16  'XYKdQ$ Temp: 98.1 F (36.7 C) 98.1 F (36.7 C)    TempSrc: Oral Oral    SpO2:      Weight:  110.3 kg    Height:        Exam: Gen:nad, sitting up, in good spirits CVS:rrr, s1s2 Resp: normal WOB YOV:ZCHYI, soft Ext: 1+ pitting edema b/l le's Neuro: awake, alert, speech clear and coherent Dialysis access: RIJ trialysis catheter     Summary: 41 yo w/ hx of HTN, DM, CKD b/l creat 1.8- 3 admitted for resp distress. Had recent admit for decomp diast CHF and AKI/ CKD from 2/1- 2/4 rx'd w/ diuresis then dc'd home. Returned on 2/11 w/ resp distress, uncont HTN and hypoxemia. CXR showed pulm edema, started on IV cardene and IV lasix. Poor response to IV lasix so started on CRRT 2/16 for volume reduction. Needs RHC at [facility] when stable.       Date   Creat  eGFR   2017   1.23   Sept 2018  1.32   Dec 2018  2.86 >> 1.30  AKI   2019   1.22- 1.30   2021   1.59- 2.75   Oct 2022  2.92 >> 2.28   AKI episode   Nov 2022  1.68- 2.08 Apr 2021  1.80- 2.73 29- 37, IIIb   Feb 1- 4, 2023 3.35 >> 3.03   Feb 11  2.63  Admit   Feb 16  3.93  CRRT started   Feb 21  1.76  CRRT dc'd      May 29, 2021  2.32      ECHO 2/11 - LVEF 70- 75%, mild LVH, indeterminate diast parameters   Assessment/ Plan: AKI on CKD 3 - b/l creat 1.80- 2.73, eGFR 29- 37 from Jan 2023. Creat here 2.4 on admit, peaked at 3.9 on 2/16. Hx of prior admits for severe uncont HTN, seizures/ CVA, AHRF/ PNA/ diast CHF. Had some proteinuria quantitated to $RemoveBefore'400mg'ayGnxNZaPlbeR$  - $'1300mg'F$  per day approximately which is non-nephrotic proteinuria. Suspect renal failure primarily d/t chronic uncont HTN +/-  cardiorenal/ diast HF component. Started CRRT 2/16 for severe vol  overload. Weaned off O2 after approx 15 kg down. CRRT dc'd 2/21 and pt moved to [facility] for iHD. Creat leveled off around 3.0 so the HD cath was pulled and IV lasix started per cards rec after ^^LVEDP at [facility]. Per cards should be ready for dc soon, possibly tomorrow, on demadex 80 qd.  Vol overload - wt's down 20kg from peak and 13 kg from admit. LVEDP [name], getting diuretics now. Still has some LE edema.  AHRF - PNA and/or pulm edema on admission Resolved SP CRRT and 7d IV cefepime. HTN urgency - sp Cardene gtt initially. Now on norvasc, coreg, clonidine and hydralazine. BP's better today again.  Anemia - Hb 9- 10 range, normal mcv, tx prn Hb <7 DM 2     Rob Ajmal Kathan 06/02/2021, 1:39 PM   Recent Labs  Lab 06/01/21 0424 06/02/21 0235  K 5.3* 4.4  BUN 58* 63*  CREATININE 2.88* 3.02*  ALBUMIN 2.7* 2.9*  CALCIUM 9.2 9.4  PHOS 5.4* 5.7*  HGB 9.1* 9.4*    Inpatient medications:  amLODipine  10 mg Oral Daily   B-complex with vitamin C  1 tablet Oral Daily   carvedilol  25 mg Oral BID WC   Chlorhexidine Gluconate Cloth  6 each Topical Q0600   cloNIDine  0.3 mg Oral TID   feeding supplement  237 mL Oral BID BM   furosemide  80 mg Intravenous Q12H   heparin  5,000 Units Subcutaneous Q8H   hydrALAZINE  75 mg Oral Q8H   insulin aspart  0-20 Units Subcutaneous TID WC   insulin aspart  0-5 Units Subcutaneous QHS   insulin aspart  4 Units Subcutaneous TID WC   isosorbide mononitrate  120 mg Oral Daily   mouth rinse  15 mL Mouth Rinse BID   pantoprazole  40 mg Oral QHS   sodium chloride flush  3 mL Intravenous Q12H   sodium chloride flush  3 mL Intravenous Q12H    sodium chloride Stopped (05/24/21 2225)   sodium chloride     sodium chloride, sodium chloride, acetaminophen, docusate sodium, labetalol, ondansetron (ZOFRAN) IV, polyethylene glycol, sodium chloride flush, sodium chloride flush

## 2021-06-03 ENCOUNTER — Encounter (HOSPITAL_COMMUNITY): Payer: Self-pay | Admitting: Interventional Cardiology

## 2021-06-03 DIAGNOSIS — J9601 Acute respiratory failure with hypoxia: Secondary | ICD-10-CM | POA: Diagnosis not present

## 2021-06-03 DIAGNOSIS — N179 Acute kidney failure, unspecified: Secondary | ICD-10-CM | POA: Diagnosis not present

## 2021-06-03 DIAGNOSIS — I5033 Acute on chronic diastolic (congestive) heart failure: Secondary | ICD-10-CM | POA: Diagnosis not present

## 2021-06-03 DIAGNOSIS — I160 Hypertensive urgency: Secondary | ICD-10-CM | POA: Diagnosis not present

## 2021-06-03 LAB — CBC WITH DIFFERENTIAL/PLATELET
Abs Immature Granulocytes: 0.1 10*3/uL — ABNORMAL HIGH (ref 0.00–0.07)
Basophils Absolute: 0.1 10*3/uL (ref 0.0–0.1)
Basophils Relative: 1 %
Eosinophils Absolute: 0.1 10*3/uL (ref 0.0–0.5)
Eosinophils Relative: 2 %
HCT: 30.2 % — ABNORMAL LOW (ref 39.0–52.0)
Hemoglobin: 9.4 g/dL — ABNORMAL LOW (ref 13.0–17.0)
Immature Granulocytes: 1 %
Lymphocytes Relative: 21 %
Lymphs Abs: 1.7 10*3/uL (ref 0.7–4.0)
MCH: 26 pg (ref 26.0–34.0)
MCHC: 31.1 g/dL (ref 30.0–36.0)
MCV: 83.4 fL (ref 80.0–100.0)
Monocytes Absolute: 1.2 10*3/uL — ABNORMAL HIGH (ref 0.1–1.0)
Monocytes Relative: 15 %
Neutro Abs: 4.9 10*3/uL (ref 1.7–7.7)
Neutrophils Relative %: 60 %
Platelets: 180 10*3/uL (ref 150–400)
RBC: 3.62 MIL/uL — ABNORMAL LOW (ref 4.22–5.81)
RDW: 18.6 % — ABNORMAL HIGH (ref 11.5–15.5)
WBC: 8.1 10*3/uL (ref 4.0–10.5)
nRBC: 0 % (ref 0.0–0.2)

## 2021-06-03 LAB — COMPREHENSIVE METABOLIC PANEL
ALT: 30 U/L (ref 0–44)
AST: 15 U/L (ref 15–41)
Albumin: 2.8 g/dL — ABNORMAL LOW (ref 3.5–5.0)
Alkaline Phosphatase: 68 U/L (ref 38–126)
Anion gap: 11 (ref 5–15)
BUN: 61 mg/dL — ABNORMAL HIGH (ref 6–20)
CO2: 25 mmol/L (ref 22–32)
Calcium: 9.3 mg/dL (ref 8.9–10.3)
Chloride: 102 mmol/L (ref 98–111)
Creatinine, Ser: 3.03 mg/dL — ABNORMAL HIGH (ref 0.61–1.24)
GFR, Estimated: 25 mL/min — ABNORMAL LOW (ref >60)
Glucose, Bld: 129 mg/dL — ABNORMAL HIGH (ref 70–99)
Potassium: 4.2 mmol/L (ref 3.5–5.1)
Sodium: 138 mmol/L (ref 135–145)
Total Bilirubin: 0.7 mg/dL (ref 0.3–1.2)
Total Protein: 6.4 g/dL — ABNORMAL LOW (ref 6.5–8.1)

## 2021-06-03 LAB — GLUCOSE, CAPILLARY
Glucose-Capillary: 140 mg/dL — ABNORMAL HIGH (ref 70–99)
Glucose-Capillary: 229 mg/dL — ABNORMAL HIGH (ref 70–99)
Glucose-Capillary: 79 mg/dL (ref 70–99)
Glucose-Capillary: 173 mg/dL — ABNORMAL HIGH (ref 70–99)

## 2021-06-03 LAB — PHOSPHORUS: Phosphorus: 5.6 mg/dL — ABNORMAL HIGH (ref 2.5–4.6)

## 2021-06-03 LAB — MAGNESIUM: Magnesium: 1.8 mg/dL (ref 1.7–2.4)

## 2021-06-03 MED ORDER — MAGNESIUM SULFATE IN D5W 1-5 GM/100ML-% IV SOLN
1.0000 g | Freq: Once | INTRAVENOUS | Status: AC
  Administered 2021-06-03: 1 g via INTRAVENOUS
  Filled 2021-06-03: qty 100

## 2021-06-03 MED ORDER — FUROSEMIDE 10 MG/ML IJ SOLN
120.0000 mg | Freq: Two times a day (BID) | INTRAVENOUS | Status: DC
  Administered 2021-06-03 – 2021-06-07 (×8): 120 mg via INTRAVENOUS
  Filled 2021-06-03 (×2): qty 10
  Filled 2021-06-03 (×2): qty 12
  Filled 2021-06-03: qty 10
  Filled 2021-06-03: qty 12
  Filled 2021-06-03 (×3): qty 10

## 2021-06-03 NOTE — Progress Notes (Signed)
Nutrition Follow-up  DOCUMENTATION CODES:  Obesity unspecified  INTERVENTION:  Continue current diet as ordered, encourage PO intake Monitor phosphorus, if continued high levels will recommend phosphate binder (renal function improving) Continue MVI regimen Continue Ensure Enlive po BID, each supplement provides 350 kcal and 20 grams of protein.  NUTRITION DIAGNOSIS:  Increased nutrient needs related to acute illness as evidenced by estimated needs. - remains applicable at this time  GOAL:  Patient will meet greater than or equal to 90% of their needs - diet and supplements in place  MONITOR:  PO intake, Supplement acceptance, Labs, Weight trends, I & O's  REASON FOR ASSESSMENT:  LOS    ASSESSMENT:  41 year old male with history of CHF, CVA, HTN, HLD, CKD3, type 2 DM, and hx of substance abuse presented to ED with worsening SOB. Found to be hypertensive and with respiratory acidosis. Admitted with acute respiratory failure. Fourth admission in the last 6 months.  Pt transferred from [facility] 2/21 for needed heart catheterization procedure which was performed 2/24. Initially requiring CRRT 2/16-2/21 for volume management in the setting of worsening renal function.  Pt resting in bedside chair at the time of assessment, RN administering medications. Discussed intake this admission. Pt reports good intake of meals and that he is eating everything that is brought to him. Discussed dietary restrictions, pt is able to find items to order despite being limited in his options.   Hopeful to be dc soon, MD notes pt requires additional diuresis prior to discharge.   Admit Weight: 120.2 kg Current Weight: 105.6 kg   Intake/Output Summary (Last 24 hours) at 06/03/2021 1530 Last data filed at 06/03/2021 1500 Gross per 24 hour  Intake 458 ml  Output 1700 ml  Net -1242 ml  Net IO Since Admission: -25,339.95 mL [06/03/21 1530]  Average Meal Intake: 2/11-2/16: 81% average intake x 9 recorded  meals 2/17-2/27: 70% average intake x 2 recorded meals  Nutritionally Relevant Medications: Scheduled Meds:  B-complex with vitamin C  1 tablet Oral Daily   feeding supplement  237 mL Oral BID BM   insulin aspart  0-20 Units Subcutaneous TID WC   insulin aspart  0-5 Units Subcutaneous QHS   insulin aspart  4 Units Subcutaneous TID WC   pantoprazole  40 mg Oral QHS   Continuous Infusions:  furosemide     PRN Meds: docusate sodium, ondansetron, polyethylene glycol  Labs Reviewed: BUN 61, creatinine 3.03 Phosphorus 5.6   NUTRITION - FOCUSED PHYSICAL EXAM: Flowsheet Row Most Recent Value  Orbital Region No depletion  Upper Arm Region No depletion  Thoracic and Lumbar Region Unable to assess  Buccal Region No depletion  Temple Region No depletion  Clavicle Bone Region No depletion  Clavicle and Acromion Bone Region No depletion  Scapular Bone Region No depletion  Dorsal Hand No depletion  Patellar Region No depletion  Anterior Thigh Region No depletion  Posterior Calf Region No depletion  Edema (RD Assessment) Severe  [BLE]  Hair Reviewed  Eyes Reviewed  Mouth Reviewed  Skin Reviewed  Nails Reviewed   Diet Order:   Diet Order             Diet renal/carb modified with fluid restriction Diet-HS Snack? Nothing; Fluid restriction: 1200 mL Fluid; Room service appropriate? Yes; Fluid consistency: Thin  Diet effective now                   EDUCATION NEEDS:  No education needs have been identified at this time  Skin:  Skin Assessment: Reviewed RN Assessment  Last BM:  2/26  Height:  Ht Readings from Last 1 Encounters:  06/01/21 5\' 8"  (1.727 m)    Weight:  Wt Readings from Last 1 Encounters:  06/03/21 105.6 kg    Ideal Body Weight:  70 kg  BMI:  Body mass index is 35.4 kg/m.  Estimated Nutritional Needs:  Kcal:  2300-2500 kcal Protein:  115-130 grams Fluid:  >/= 1.5 L/day   06/05/21, RD, LDN Clinical Dietitian RD pager # available in [facility]   After hours/weekend pager # available in [facility]

## 2021-06-03 NOTE — Progress Notes (Signed)
[facility] Progress Note  Subjective:  Cardiology inc lasix this AM for ongoing hypervolemia SCr stable 3.0, BUN 62, K 4.2 NO c/o at this time, on RA   Vitals:   06/02/21 2017 06/03/21 0426 06/03/21 0810 06/03/21 1147  BP: (!) 145/91 (!) 155/98 (!) 162/103 (!) 155/86  Pulse: 63 72 73 67  Resp:    16  Temp: 97.8 F (36.6 C) 98.9 F (37.2 C)  98.7 F (37.1 C)  TempSrc: Oral Oral  Oral  SpO2: 95% 91%    Weight:  105.6 kg    Height:        Exam: Gen:nad, sitting up, in good spirits CVS:rrr, s1s2 Resp: normal WOB ASN:KNLZJ, soft Ext: 2+ pitting edema b/l le's Neuro: awake, alert, speech clear and coherent Dialysis access: none   Summary: 41 yo w/ hx of HTN, DM, CKD b/l creat 1.8- 3 admitted for resp distress. Had recent admit for decomp diast CHF and AKI/ CKD from 2/1- 2/4 rx'd w/ diuresis then dc'd home. Returned on 2/11 w/ resp distress, uncont HTN and hypoxemia. CXR showed pulm edema, started on IV cardene and IV lasix. Poor response to IV lasix so started on CRRT 2/16 for volume reduction. RHC at [facility] when 2/24      Date   Creat  eGFR   2017   1.23   Sept 2018  1.32   Dec 2018  2.86 >> 1.30  AKI   2019   1.22- 1.30   2021   1.59- 2.75   Oct 2022  2.92 >> 2.28   AKI episode   Nov 2022  1.68- 2.08 Apr 2021  1.80- 2.73 43- 43,, IIIb   Feb 1- 4, 2023 3.35 >> 3.03   Feb 11  2.63  Admit   Feb 16  3.93  CRRT started   Feb 21  1.76  CRRT dc'd      May 29, 2021  2.32      ECHO 2/11 - LVEF 70- 75%, mild LVH, indeterminate diast parameters   Assessment/ Plan: AKI on CKD 3 - b/l creat 1.80- 2.73, eGFR 29- 37 from Jan 2023. Creat here 2.4 on admit, peaked at 3.9 on 2/16. Hx of prior admits for severe uncont HTN, seizures/ CVA, AHRF/ PNA/ diast CHF. Had some proteinuria quantitated to $RemoveBefore'400mg'[id number]$  - $'1300mg'D$  per day approximately which is non-nephrotic proteinuria. Suspect renal failure primarily d/t chronic uncont HTN +/-  cardiorenal/ diast HF component. Started  CRRT 2/16 for severe vol overload. Weaned off O2 after approx 15 kg down. CRRT dc'd 2/21 and pt moved to [facility] for iHD. Creat leveled off around 3.0 so the HD cath was pulled and IV lasix started per cards rec after ^^LVEDP at [facility]. Cardiology managing diuretics at this time. At baseline GFR. No futher inpatinet renal issued but will arrange f/u at [place].  Vol overload - wt's down 20kg from peak and 13 kg from admit. LVEDP [name], getting diuretics now. Still has some LE edema. As above.  AHRF - PNA and/or pulm edema on admission Resolved SP CRRT and 7d IV cefepime. HTN urgency - sp Cardene gtt initially. Now on norvasc, coreg, clonidine and hydralazine. BP's better today again.  Anemia - Hb 9- 10 range, normal mcv, tx prn Hb <7 DM 2   Rexene Agent, MD  06/03/2021, 1:28 PM   Recent Labs  Lab 06/02/21 0235 06/03/21 0311  K 4.4 4.2  BUN 63* 61*  CREATININE 3.02* 3.03*  ALBUMIN 2.9* 2.8*  CALCIUM 9.4 9.3  PHOS 5.7* 5.6*  HGB 9.4* 9.4*    Inpatient medications:  amLODipine  10 mg Oral Daily   B-complex with vitamin C  1 tablet Oral Daily   carvedilol  25 mg Oral BID WC   cloNIDine  0.3 mg Oral TID   feeding supplement  237 mL Oral BID BM   heparin  5,000 Units Subcutaneous Q8H   hydrALAZINE  75 mg Oral Q8H   insulin aspart  0-20 Units Subcutaneous TID WC   insulin aspart  0-5 Units Subcutaneous QHS   insulin aspart  4 Units Subcutaneous TID WC   isosorbide mononitrate  120 mg Oral Daily   mouth rinse  15 mL Mouth Rinse BID   pantoprazole  40 mg Oral QHS   sodium chloride flush  3 mL Intravenous Q12H    sodium chloride Stopped (05/24/21 2225)   furosemide     sodium chloride, acetaminophen, docusate sodium, labetalol, ondansetron (ZOFRAN) IV, polyethylene glycol, sodium chloride flush

## 2021-06-03 NOTE — Progress Notes (Signed)
Progress Note   Patient: Micheal Levy:[id number] DOB: [birth year] DOA: 05/18/2021     16 DOS: the patient was seen and examined on 06/03/2021   Brief hospital course: The patient is a 41 year old obese African-American male with a past medical history significant for but limited to diabetes mellitus type 2, hyperlipidemia, hypertension, history of CKD stage IIIa with baseline creatinine around 3, chronic diastolic CHF with last EF of 55 to 60% and grade 3 diastolic dysfunction noted on echo back in October 2022, history of substance abuse as well as other comorbidities who presented to the [name] ED on 05/18/2021 for respiratory distress.  He was recently hospitalized from 200-4 2023 for acute on chronic diastolic CHF acute on chronic kidney disease and at that time presented with dyspnea, PND, worsening pedal edema and elevated BNP.  His renal ultrasound at that time was within normal limits and he was treated with IV Lasix and transition to p.o. on discharge home.  He returns back on 05/18/2021 with worsening respiratory distress and initially was treated as an asthma exacerbation with epinephrine by EMS found to be hypertensive with systolic blood pressure in the 180s to 595G and diastolic blood pressure 387.  ABG was done and showed respiratory acidosis with a mixed picture with saturations in the 80s.  Chest x-ray was concerning for pulmonary edema and he had significant crackles on examination.  He was admitted for acute hypoxic respiratory failure due to bilateral infiltrates with arts and pulmonary hypertension.  Patient was started on nitro drip and transition to nicardipine for systolic blood pressure 564.  At that time he was given Lasix 40 mg x 1.  PCCM was consulted for admission in the setting of respiratory distress and hypertensive emergency and he was admitted on 05/18/2018 and then on 05/20/2021 he was placed on BiPAP 18/6 with 60% FiO2.  05/21/2021 he had left IJ central venous  catheter placed.  He remained on Cardene and 05/22/2021 his Cardene drip started being down titrated and his p.o. antihypertensives were uptitrated.  His respiratory status at that time was stable on 15 L high flow nasal cannula and he still had improvement but with marginal urine output with aggressive diuresis.  On 05/23/2021 he had continued improvement in his O2 needs but remained hypertensive off Cardene and nephrology was consulted for HD/CRRT to optimize for right heart cath.  His right IJ catheter was changed out to a trialysis catheter and on 05/24/2021 he initiated CRRT and had 3.2 L removed and his O2 saturations we will wean down to 10 L and he tolerated this well.  On 05/26/2021 he subjectively felt better but his chest x-ray did not change and had elevated blood pressure despite multiple agents and remained on CRRT.  On tube 22,023 he was weaned on 2 L of nasal cannula and he was net 5.9 L and felt better but subsequently he was transferred to [facility] for right heart catheterization and initiation of intermittent hemodialysis.  Cardiology evaluated the patient and planning for a heart catheterization on Friday.  Patient's volume will has improved and his chest x-ray is better and he is down 15 to 20 kg with CRRT.  Plan is for right heart cath scheduled for Friday and patient has initiated 24-hour urine protein collection.  The recommendation is for an abdominal fat pad/bone marrow biopsy for amyloid and the PCCM PA reviewed the case with hematology and they felt that the light chains could be some degree of poor clearance  of renal failure and recommending further biopsy to rule out other disease processes but given this a myeloma panel will be sent at 24-hour urine for completeness.  May discuss with IR about a fat pad biopsy/bone marrow biopsy but will await his right heart cath first.  Patient's renal function is mildly elevated today and may be stabilizing.  Nephrology recommends no dialysis at  this time and they recommend continuing the patient on a renal diet for now.  Plan is for cardiac catheterization in the a.m but patient's volume status is improved.  Cardiology is increasing his Norvasc today and they are planning on outpatient work-up for questionable cardiac amyloidosis.  Patient underwent cardiac cath on 05/31/2021.  Renal function continued to climb slowly so will need to monitor carefully but is now improving. Cardiology trying a dose of IV Lasix and continuing monitor of renal Fxn. He is on IV Lasix 80 mg BID. Nephrology now going to D/C Temp HD Cath.  Patient is being diuresed and Cr is slightly bumped to 3.0. Cardiology recommending continuing IV Lasix today and possibly converting to po Demadex.  He remains significantly volume overloaded and had extremely high pulmonary blood pressures they are recommending continue IV Lasix and increasing this to 120 mg p.o. twice daily  Assessment and Plan: * Acute respiratory failure ([place])- (present on admission) -PCCM Signed off  -Patient had Nodular Infiltrates on CXR/CT and was not consistent with pulmonary edema -Initially concerned with elevated PCT and because of lack of response to diuretics that could be infectious vs. Inflammatory process. -They initially felt that hydralazine induced pneumonitis was possible but he had a negative antihistone antibody. -His HIV was negative -Per pulmonary he had a similar nodular infiltrates dating back to 01/2021 -He continues to have residual infiltrates on plain films despite significant volume reduction -Autoimmune panel has been largely negative except elevated kappa lambda light chains -His ESR was 56 and his ANA was negative and RF was negative as well as ANCA being negative. -UDS was negative and RV panel was negative his protein/creatinine ratio was 1.39 and trended down to 0.40 with a negative histone antibody negative -His kappa light chain was 109.4 and his lambda was 40.6 with a  ratio being 2.69 -SpO2: 91 % O2 Flow Rate (L/min): 2 L/min (Rechecked saturation now with 2 L O2) FiO2 (%): 40 % -Per pulmonary continue weaning oxygen saturations greater than 90%; Now off of O2 -Follow intermittent chest x-ray repeat chest this AM showed "A right jugular catheter remains in place, terminating over the mid SVC. The cardiac silhouette remains enlarged. Interstitial and airspace opacities in the right greater than left lungs have slightly improved. No sizable pleural effusion or pneumothorax is identified." -We will consider repeat CT imaging once he is dry to evaluate the parenchyma nodular infiltrates and he will get a right heart cath on Friday to evaluate his pulmonary pressures; will likely repeat noncontrast imaging likely in the AM once he is drier -Fluid removal with CRRT has been completed now he is at [facility] for intermittent hemodialysis and will defer dialysis to nephrology and they feel that there is no current indication for dialysis at this time and now getting IV Lasix  -Continue to monitor respiratory status carefully and if necessary will place him back on nebs -We will order flutter valve and incentive spirometry -Continue to monitor blood pressures per protocol   Hyperkalemia -K+ improved from 5.3 -> 4.4 -> 4.2 -Getting IV Lasix per Cards and Nephro -Continue  to Monitor and Trend and may need to get Lokelma.  -Continue to Monitor and Trend Renal Fxn  -Repeat CMP in the AM  Obesity (BMI 53-64.6) -Complicates overall prognosis and care -Estimated body mass index is 36.03 kg/m as calculated from the following:   Height as of this encounter: $RemoveBeforeD'5\' 8"'[id number]$  (1.727 m).   Weight as of this encounter: 107.5 kg.  -Weight Loss and Dietary Counseling given   Normocytic anemia - Patient's hemoglobin/hematocrit is relatively stable and and trend has gone from 9.7/30.4 -> 9.1/29.9 -> 9.4/30.3 -> 9.4/30.2 -Likely anemia of chronic kidney disease -Anemia panel was  checked and iron level was 33, U IBC is 261, TIBC 294, saturation ratios of 11%, ferritin level 111, folate level 11.1, vitamin B12 626 -Nephrology recommending transfusing as needed hemoglobin for hemoglobin less than 7 -Continue to monitor for signs and symptoms bleeding; currently no overt bleeding noted -Repeat CBC in a.m.    Pulmonary hypertension ([place]) -Patient is to undergo a right heart cath today to evaluate his pulmonary pressures -His vasculitis panel was negative but his kappa and lambda light chains are slightly elevated -he is off of CRRT and has been transferred to [facility] for intermittent hemodialysis -Undergoing 24-hour urine protein collection and this is complete; Now getting a Urine Fixation  -Once he is euvolemic pulmonary recommended repeating CT scan to evaluate his parenchyma and nodular infiltrates; will consider this after his cardiac catheterization and once he has been diuresed -Fluid was removed via CRRT and will be now controlled with intermittent hemodialysis but renal recommends no dialysis at this time and his Temp Cath is being removed; Continue to Monitor Renal Fxn carefully -Patient was started on IV Lasix 80 mg twice daily and this is increased to 120 mg p.o. 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  He is getting a myeloma panel sent out and a 24-hour urine protein.  -SPEP had immunofixation that was normal  -His kappa and lambda light chains were elevated and his ratio was elevated.  -We will consider obtaining a fat pad biopsy and and/or bone marrow biopsy and will need to discuss with hematology further -echo was unchanged but showed an more vigorous elevated LV function. -He has elevated Lambda Light Chains with Concern for Amyloidosis -Cardiology evaluating and  he will need outpatient hematology referral and will discuss with them about next steps about possibly getting a bone marrow biopsy and abdominal fat pad biopsy while in house but will have further cardiac amyloidosis work-up in the outpatient setting -Patient went for a Right heart cath tomorrow 05/31/2021 and he will be n.p.o. after midnight -His antihistone testing was negative so his hydralazine has been resumed -Cardiology is now increasing IV Lasix 80 mg to IV 120 mg BID   -Cardiology recommending continuing carvedilol 25 mg p.o. twice daily as well as Imdur 120 mg p.o. daily.  Recommending restarting amlodipine 5 mg p.o. daily if blood pressure still at goal and they are recommending starting minoxidil.  They also recommended clonidine 0.3 mg 3 times daily -Per cardiology no ACE/ARB/ARNI or mineralocorticoid receptor antagonist given his renal function -Cardiology recommended outpatient sleep study -Further plans per cardiology clinically suspect that he does not have amyloid and they feel that his restrictive cardiomyopathy is secondary to hypertensive heart disease and poorly controlled blood pressure over  time but they feel it is not unreasonable to consider hematology consultation regarding his elevated light chains in the outpatient setting; In the Interim they are ordering Urine Immunofixation    Acute on chronic diastolic CHF (congestive heart failure) ([place])- (present on admission) -See Restrictive Cardiomyopathy -GI evaluating and recommending continuing Coreg 25 mg p.o. twice daily, Imdur 120 g p.o. daily they have increased her Norvasc to 10 mg p.o. daily.  They are recommending no ACEi/ARB/ARNI or MRA given his renal function worsening -Plan is for cardiac cath with a right heart catheterization 05/31/2021 and this was done today; Cath showed " Ao sat 90%, PA sat 62%, PA pressure 83/37, mean PA pressure 54 mm Hg; mean PCWP 38 mm Hg; prominent V waves on wedge tracing; CO 7.9 L/min; CI  3.6. Severe systemic hypertension. Hemodynamic findings consistent with severe pulmonary hypertension. He still appears quite volume overloaded. rominent V wave on wedge tracing may represent restrictive physiology. Continue medical therapy including volume removal with dialysis."  -Further Care per Cards and will get IV Lasix per Cards and Nephrology Recc's; Received IV Lasix 80 and is now been increased to 120 mg twice daily -Strict I's and O's and Daily Weights; Patient is -28.975 Liters   Uncontrolled type 2 diabetes mellitus with hyperglycemia ([place])- (present on admission) -Patient was placed on a resistant sign scale insulin before meals and at bedtime and also has meal coverage with 4 units 3 times daily -CBGs ranging from 105-229 -HbA1c 6.6 -Continue monitor CBGs per protocol and adjust insulin as necessary  Hypertensive urgency- (present on admission) -Appreciate cardiology evaluation -Off of the nitro drip and Cardene drips -Cardiology has initiated carvedilol 25 mg p.o. twice daily, Imdur 120 mg p.o. daily, clonidine 0.3 mg p.o. 3 times daily -C/w Norvasc 10 mg p.o. daily and Cardiology recommending resuming minoxidil if blood pressure still remain uncontrolled and not at goal -Patient has been removed off of the hydralazine but since his antihistone testing is negative they discussed that it is safe for him to take the hydralazine but he refused it on 05/28/2021 -Patient get cardiology input and a right heart cath as well as some cardiac imaging at some point -Currently cardiology recommends no ACE inhibitor/ARB/ARNI or MRI given his renal function -Continue monitor blood pressures per protocol -Last Blood Pressure reading was 155/86   AKI (acute kidney injury) ([place]) -Has a history of CKD stage IIIb with a baseline creatinine 1.8-3 -His creatinine on admission was 2.4 and peaked at 3.9 so CRRT was started on 05/23/2021 -Nephrology felt that he had a cardiorenal cause of his AKI  and had some proteinuria which was quantified at 400 mg to 4 1300 mg p.o. daily approximately and they felt that this is in the nonnephrotic proteinuria. -He started on CRRT 05/23/2018 for massive volume overload and was weaned off oxygen his chest x-ray had improved but he is back on oxygen again today -He was transferred to [facility] for intermittent hemodialysis and his CRRT was discontinued on 05/28/2021; nephrology feels no indication for hemodialysis at this point and feel that he may have some renal recovery and may be plateauing -Patient is going to get IV Lasix 80 mg IV BID and will continue  -Continue strict I's and O's and daily weights -Patient's BUNs/creatinine went from 33/1.62 -> 34/1.76 -> 38/2.32 and is now elevated slightly at 51/2.60 -> 58/3.12 -> 58/2.88 -> 63/3.02 -> 61/3.03 -Further care per nephrology and will avoid further nephrotoxic medication, contrast dyes if possible, hypotension  renally dose medications -Repeat CMP in a.m.    Nutrition Documentation    [place] ED to Hosp-Admission (Current) from 05/18/2021 in [facility]  Nutrition Problem Increased nutrient needs  Etiology acute illness  Nutrition Goal Patient will meet greater than or equal to 90% of their needs  Interventions Ensure Enlive (each supplement provides 350kcal and 20 grams of protein)      Subjective: Seen and examined at bedside and is hoping to go home but remains significantly volume overloaded.  Denies any chest pain or shortness of breath but after cardiology evaluated recommending increasing his diuresis.  No other concerns or complaints this time  Physical Exam: Vitals:   06/02/21 2017 06/03/21 0426 06/03/21 0810 06/03/21 1147  BP: (!) 145/91 (!) 155/98 (!) 162/103 (!) 155/86  Pulse: 63 72 73 67  Resp:    16  Temp: 97.8 F (36.6 C) 98.9 F (37.2 C)  98.7 F (37.1 C)  TempSrc: Oral Oral  Oral  SpO2: 95% 91%    Weight:  105.6 kg    Height:        Examination: Physical Exam:  Constitutional: WN/WD obese AAM in NAD appears calm Respiratory: Diminished to auscultation bilaterally, no wheezing, rales, rhonchi or crackles. Unlabored Breathing  Cardiovascular: RRR, no murmurs / rubs / gallops. S1 and S2 auscultated. 1+ LE Edema  Abdomen: Soft, non-tender, Distended. Bowel sounds positive x4.  GU: Deferred. Neurologic: CN 2-12 grossly intact with no focal deficits.   Psychiatric: Normal judgment and insight. Alert and oriented x 3. Skin: Has multiple tattoos and has no rashes or lesions noted on limited skin evaluation  Data Reviewed:  Independently reviewed and assessed the patient's clinical laboratory data including a CMP and CBC.  Patient CBC shows that he has a stable hemoglobin at 9.4/30.2.  CMP shows that he has a stable creatinine of 3.03.  Family Communication: No family currently at bedside  Disposition: Status is: Inpatient Remains inpatient appropriate because: Remains volume overloaded and needs diuresis  Planned Discharge Destination: Home  Author: Raiford Noble, DO [facility]  06/03/2021 2:40 PM  For on call review [email or web address].

## 2021-06-03 NOTE — Progress Notes (Signed)
Pt O2 ranging 86-94 while ambulating in hall.

## 2021-06-03 NOTE — Progress Notes (Addendum)
Progress Note  Patient Name: Micheal Levy Date of Encounter: 06/03/2021  Primary Cardiologist: Berniece Salines, DO  Subjective   Net negative 350cc yesterday, -24.5 L on admission.  BP 162/103 this morning.  Creatinine stable at 3.0 this morning.  Denies chest pain or dyspnea  Inpatient Medications    Scheduled Meds:  amLODipine  10 mg Oral Daily   B-complex with vitamin C  1 tablet Oral Daily   carvedilol  25 mg Oral BID WC   cloNIDine  0.3 mg Oral TID   feeding supplement  237 mL Oral BID BM   furosemide  80 mg Intravenous Q12H   heparin  5,000 Units Subcutaneous Q8H   hydrALAZINE  75 mg Oral Q8H   insulin aspart  0-20 Units Subcutaneous TID WC   insulin aspart  0-5 Units Subcutaneous QHS   insulin aspart  4 Units Subcutaneous TID WC   isosorbide mononitrate  120 mg Oral Daily   mouth rinse  15 mL Mouth Rinse BID   pantoprazole  40 mg Oral QHS   sodium chloride flush  3 mL Intravenous Q12H   Continuous Infusions:  sodium chloride Stopped (05/24/21 2225)   magnesium sulfate bolus IVPB     PRN Meds: sodium chloride, acetaminophen, docusate sodium, labetalol, ondansetron (ZOFRAN) IV, polyethylene glycol, sodium chloride flush   Vital Signs    Vitals:   06/02/21 1759 06/02/21 2017 06/03/21 0426 06/03/21 0810  BP: (!) 145/86 (!) 145/91 (!) 155/98 (!) 162/103  Pulse: 69 63 72 73  Resp:      Temp:  97.8 F (36.6 C) 98.9 F (37.2 C)   TempSrc:  Oral Oral   SpO2:  95% 91%   Weight:   105.6 kg   Height:        Intake/Output Summary (Last 24 hours) at 06/03/2021 0905 Last data filed at 06/02/2021 2146 Gross per 24 hour  Intake 594 ml  Output 1125 ml  Net -531 ml    Filed Weights   06/01/21 0455 06/02/21 0523 06/03/21 0426  Weight: 107.4 kg 110.3 kg 105.6 kg    Telemetry    Sinus rhythm.  Personally reviewed.  ECG    No ECG reviewed.  Physical Exam   GEN: No acute distress.   Neck: +JVD. Cardiac: RRR, no murmur Respiratory: Decreased breath sounds  at the bases.  GI: Soft, nontender MS: 2+ lower leg edema; No deformity. Neuro:  Nonfocal. Psych: Alert and oriented x 3. Normal affect.  Labs    Chemistry Recent Labs  Lab 06/01/21 0424 06/02/21 0235 06/03/21 0311  NA 136 137 138  K 5.3* 4.4 4.2  CL 104 102 102  CO2 23 26 25   GLUCOSE 132* 167* 129*  BUN 58* 63* 61*  CREATININE 2.88* 3.02* 3.03*  CALCIUM 9.2 9.4 9.3  PROT 6.2* 6.5 6.4*  ALBUMIN 2.7* 2.9* 2.8*  AST 19 17 15   ALT 39 35 30  ALKPHOS 58 65 68  BILITOT 0.3 0.4 0.7  GFRNONAA 27* 26* 25*  ANIONGAP 9 9 11       Hematology Recent Labs  Lab 06/01/21 0424 06/02/21 0235 06/03/21 0311  WBC 7.9 7.4 8.1  RBC 3.54* 3.66* 3.62*  HGB 9.1* 9.4* 9.4*  HCT 29.9* 30.3* 30.2*  MCV 84.5 82.8 83.4  MCH 25.7* 25.7* 26.0  MCHC 30.4 31.0 31.1  RDW 18.7* 18.6* 18.6*  PLT 184 187 180     Cardiac Enzymes Recent Labs  Lab 05/08/21 1634 05/08/21 1840 05/18/21 0008 05/18/21  0250 05/18/21 0606  TROPONINIHS 24* 23* 26* 48* 68*     Radiology    DG CHEST PORT 1 VIEW  Result Date: 06/02/2021 CLINICAL DATA:  Dyspnea EXAM: PORTABLE CHEST 1 VIEW COMPARISON:  06/02/2021 FINDINGS: Lungs are symmetrically well expanded. Superimposed asymmetric airspace infiltrates, more severe within the right lung, appear slightly improved since prior examination and significantly improved since remote prior examination of 05/23/2021 in keeping with mild residual edema or inflammatory infiltrate. No pneumothorax or pleural effusion. Cardiomegaly is stable. No acute bone abnormality. IMPRESSION: Slight interval improvement in bilateral asymmetric airspace infiltrate, likely representing residual edema or inflammatory infiltrate Stable cardiomegaly Electronically Signed   By: Fidela Salisbury M.D.   On: 06/02/2021 19:58   DG CHEST PORT 1 VIEW  Result Date: 06/02/2021 CLINICAL DATA:  Shob Evaluate Infiltrate pleural effusion or pneumothorax EXAM: PORTABLE CHEST 1 VIEW COMPARISON:  05/30/2021 and  older exams. FINDINGS: Mild enlargement of the cardiopericardial silhouette. Interstitial hazy airspace lung opacities are noted in a patchy distribution, most evident in the right mid to upper lung, without change from the most recent prior exam. No new lung abnormalities. No pneumothorax. Right internal jugular central venous line has been removed. IMPRESSION: 1. Patchy bilateral lung infiltrates are without significant change from the most recent prior study, overall improved from the CT dated 05/21/2021. No new lung abnormalities. Electronically Signed   By: Lajean Manes M.D.   On: 06/02/2021 09:07    Cardiac Studies   Echocardiogram 05/18/2021:  1. Left ventricular ejection fraction, by estimation, is 70 to 75%. The  left ventricle has hyperdynamic function. The left ventricle has no  regional wall motion abnormalities. There is mild concentric left  ventricular hypertrophy. Left ventricular  diastolic parameters are indeterminate. Elevated left ventricular  end-diastolic pressure. The average left ventricular global longitudinal  strain is -24.8 %. The global longitudinal strain is normal.   2. Right ventricular systolic function is normal. The right ventricular  size is mildly enlarged. There is moderately elevated pulmonary artery  systolic pressure. The estimated right ventricular systolic pressure is  123456 mmHg.   3. The mitral valve is normal in structure. No evidence of mitral valve  regurgitation. No evidence of mitral stenosis.   4. The aortic valve is normal in structure. Aortic valve regurgitation is  not visualized. No aortic stenosis is present.   5. Aortic dilatation noted. There is mild dilatation of the ascending  aorta, measuring 38 mm.   6. The inferior vena cava is dilated in size with <50% respiratory  variability, suggesting right atrial pressure of 15 mmHg.   Assessment & Plan    HFpEF with acute on chronic diastolic heart failure and associated severe pulmonary  hypertension, WHO group 2 based on recent right heart catheterization.  Concurrent question of AL amyloidosis based on elevated free light chains however SPEP with immunofixation normal.  Echocardiographic findings are not overly suggestive of amyloidosis including global longitudinal strain pattern. Case was discussed with Dr. Haroldine Laws last week, may be an issue of restrictive cardiomyopathy with hypertensive heart disease and poorly controlled blood pressure over time.   --Significantly elevated pressures on RHC 2/24: RA 22, RV 78/8, PA 83/37, PCWP 38, PA sat 62%, CI 3.6.  Remains volume overloaded, markedly elevated pressures on RHC 2/24. Continue IV Lasix, will increase to 120 mg BID  Hypertensive emergency complicating presentation with severe hypertension at baseline.  BP remains elevated.  Currently on carvedilol 25 mg twice daily, clonidine 0.3 mg 3 times daily,  IV Lasix 80 mg twice daily, hydralazine 75 mg 3 times daily  Acute kidney injury on CKD stage IIIb.  Followed by nephrology and required CRRT initially for volume overload, has not required hemodialysis at this point.  Creatinine currently stable at 3.0  Acute hypoxic respiratory failure, initially managed for pneumonia and pulmonary edema.  Resolved, on room air.  Substance abuse history, in remission (previously cocaine and marijuana).   Signed, Donato Heinz, MD  06/03/2021, 9:05 AM

## 2021-06-04 DIAGNOSIS — I160 Hypertensive urgency: Secondary | ICD-10-CM | POA: Diagnosis not present

## 2021-06-04 DIAGNOSIS — I5033 Acute on chronic diastolic (congestive) heart failure: Secondary | ICD-10-CM | POA: Diagnosis not present

## 2021-06-04 DIAGNOSIS — N179 Acute kidney failure, unspecified: Secondary | ICD-10-CM | POA: Diagnosis not present

## 2021-06-04 DIAGNOSIS — J9601 Acute respiratory failure with hypoxia: Secondary | ICD-10-CM | POA: Diagnosis not present

## 2021-06-04 LAB — COMPREHENSIVE METABOLIC PANEL
ALT: 30 U/L (ref 0–44)
AST: 22 U/L (ref 15–41)
Albumin: 3.3 g/dL — ABNORMAL LOW (ref 3.5–5.0)
Alkaline Phosphatase: 71 U/L (ref 38–126)
Anion gap: 11 (ref 5–15)
BUN: 60 mg/dL — ABNORMAL HIGH (ref 6–20)
CO2: 27 mmol/L (ref 22–32)
Calcium: 9.7 mg/dL (ref 8.9–10.3)
Chloride: 98 mmol/L (ref 98–111)
Creatinine, Ser: 3.02 mg/dL — ABNORMAL HIGH (ref 0.61–1.24)
GFR, Estimated: 26 mL/min — ABNORMAL LOW (ref >60)
Glucose, Bld: 139 mg/dL — ABNORMAL HIGH (ref 70–99)
Potassium: 4 mmol/L (ref 3.5–5.1)
Sodium: 136 mmol/L (ref 135–145)
Total Bilirubin: 0.6 mg/dL (ref 0.3–1.2)
Total Protein: 7.1 g/dL (ref 6.5–8.1)

## 2021-06-04 LAB — POCT I-STAT EG7
Acid-Base Excess: 2 mmol/L (ref 0.0–2.0)
Acid-Base Excess: 2 mmol/L (ref 0.0–2.0)
Bicarbonate: 26.8 mmol/L (ref 20.0–28.0)
Bicarbonate: 27.7 mmol/L (ref 20.0–28.0)
Calcium, Ion: 1.3 mmol/L (ref 1.15–1.40)
Calcium, Ion: 1.3 mmol/L (ref 1.15–1.40)
HCT: 31 % — ABNORMAL LOW (ref 39.0–52.0)
HCT: 32 % — ABNORMAL LOW (ref 39.0–52.0)
Hemoglobin: 10.5 g/dL — ABNORMAL LOW (ref 13.0–17.0)
Hemoglobin: 10.9 g/dL — ABNORMAL LOW (ref 13.0–17.0)
O2 Saturation: 59 %
O2 Saturation: 65 %
Potassium: 5 mmol/L (ref 3.5–5.1)
Potassium: 5 mmol/L (ref 3.5–5.1)
Sodium: 136 mmol/L (ref 135–145)
Sodium: 137 mmol/L (ref 135–145)
TCO2: 28 mmol/L (ref 22–32)
TCO2: 29 mmol/L (ref 22–32)
pCO2, Ven: 44.2 mmHg (ref 44–60)
pCO2, Ven: 45.1 mmHg (ref 44–60)
pH, Ven: 7.391 (ref 7.25–7.43)
pH, Ven: 7.397 (ref 7.25–7.43)
pO2, Ven: 31 mmHg — CL (ref 32–45)
pO2, Ven: 34 mmHg (ref 32–45)

## 2021-06-04 LAB — CBC WITH DIFFERENTIAL/PLATELET
Abs Immature Granulocytes: 0.05 10*3/uL (ref 0.00–0.07)
Basophils Absolute: 0.1 10*3/uL (ref 0.0–0.1)
Basophils Relative: 1 %
Eosinophils Absolute: 0.2 10*3/uL (ref 0.0–0.5)
Eosinophils Relative: 2 %
HCT: 32.9 % — ABNORMAL LOW (ref 39.0–52.0)
Hemoglobin: 10.3 g/dL — ABNORMAL LOW (ref 13.0–17.0)
Immature Granulocytes: 1 %
Lymphocytes Relative: 27 %
Lymphs Abs: 2.2 10*3/uL (ref 0.7–4.0)
MCH: 26.1 pg (ref 26.0–34.0)
MCHC: 31.3 g/dL (ref 30.0–36.0)
MCV: 83.3 fL (ref 80.0–100.0)
Monocytes Absolute: 1.2 10*3/uL — ABNORMAL HIGH (ref 0.1–1.0)
Monocytes Relative: 14 %
Neutro Abs: 4.5 10*3/uL (ref 1.7–7.7)
Neutrophils Relative %: 55 %
Platelets: 221 10*3/uL (ref 150–400)
RBC: 3.95 MIL/uL — ABNORMAL LOW (ref 4.22–5.81)
RDW: 18.6 % — ABNORMAL HIGH (ref 11.5–15.5)
WBC: 8.2 10*3/uL (ref 4.0–10.5)
nRBC: 0 % (ref 0.0–0.2)

## 2021-06-04 LAB — GLUCOSE, CAPILLARY
Glucose-Capillary: 153 mg/dL — ABNORMAL HIGH (ref 70–99)
Glucose-Capillary: 199 mg/dL — ABNORMAL HIGH (ref 70–99)
Glucose-Capillary: 126 mg/dL — ABNORMAL HIGH (ref 70–99)
Glucose-Capillary: 98 mg/dL (ref 70–99)
Glucose-Capillary: 159 mg/dL — ABNORMAL HIGH (ref 70–99)

## 2021-06-04 LAB — MAGNESIUM: Magnesium: 2.2 mg/dL (ref 1.7–2.4)

## 2021-06-04 LAB — PHOSPHORUS: Phosphorus: 5.4 mg/dL — ABNORMAL HIGH (ref 2.5–4.6)

## 2021-06-04 NOTE — Progress Notes (Signed)
Progress Note   Patient: Micheal Levy OIB:[id number] DOB: 03-10-1981 DOA: 05/18/2021     17 DOS: the patient was seen and examined on 06/04/2021   Brief hospital course: The patient is a 41 year old obese African-American male with a past medical history significant for but limited to diabetes mellitus type 2, hyperlipidemia, hypertension, history of CKD stage IIIa with baseline creatinine around 3, chronic diastolic CHF with last EF of 55 to 60% and grade 3 diastolic dysfunction noted on echo back in October 2022, history of substance abuse as well as other comorbidities who presented to the [name] ED on 05/18/2021 for respiratory distress.  He was recently hospitalized from 200-4 2023 for acute on chronic diastolic CHF acute on chronic kidney disease and at that time presented with dyspnea, PND, worsening pedal edema and elevated BNP.  His renal ultrasound at that time was within normal limits and he was treated with IV Lasix and transition to p.o. on discharge home.  He returns back on 05/18/2021 with worsening respiratory distress and initially was treated as an asthma exacerbation with epinephrine by EMS found to be hypertensive with systolic blood pressure in the 180s to 945W and diastolic blood pressure 388.  ABG was done and showed respiratory acidosis with a mixed picture with saturations in the 80s.  Chest x-ray was concerning for pulmonary edema and he had significant crackles on examination.  He was admitted for acute hypoxic respiratory failure due to bilateral infiltrates with arts and pulmonary hypertension.  Patient was started on nitro drip and transition to nicardipine for systolic blood pressure 828.  At that time he was given Lasix 40 mg x 1.  PCCM was consulted for admission in the setting of respiratory distress and hypertensive emergency and he was admitted on 05/18/2018 and then on 05/20/2021 he was placed on BiPAP 18/6 with 60% FiO2.  05/21/2021 he had left IJ central venous  catheter placed.  He remained on Cardene and 05/22/2021 his Cardene drip started being down titrated and his p.o. antihypertensives were uptitrated.  His respiratory status at that time was stable on 15 L high flow nasal cannula and he still had improvement but with marginal urine output with aggressive diuresis.  On 05/23/2021 he had continued improvement in his O2 needs but remained hypertensive off Cardene and nephrology was consulted for HD/CRRT to optimize for right heart cath.  His right IJ catheter was changed out to a trialysis catheter and on 05/24/2021 he initiated CRRT and had 3.2 L removed and his O2 saturations we will wean down to 10 L and he tolerated this well.  On 05/26/2021 he subjectively felt better but his chest x-ray did not change and had elevated blood pressure despite multiple agents and remained on CRRT.  On tube 22,023 he was weaned on 2 L of nasal cannula and he was net 5.9 L and felt better but subsequently he was transferred to [facility] for right heart catheterization and initiation of intermittent hemodialysis.  Cardiology evaluated the patient and planning for a heart catheterization on Friday.  Patient's volume will has improved and his chest x-ray is better and he is down 15 to 20 kg with CRRT.  Plan is for right heart cath scheduled for Friday and patient has initiated 24-hour urine protein collection.  The recommendation is for an abdominal fat pad/bone marrow biopsy for amyloid and the PCCM PA reviewed the case with hematology and they felt that the light chains could be some degree of poor clearance  of renal failure and recommending further biopsy to rule out other disease processes but given this a myeloma panel will be sent at 24-hour urine for completeness.  May discuss with IR about a fat pad biopsy/bone marrow biopsy but will await his right heart cath first.  Patient's renal function is mildly elevated today and may be stabilizing.  Nephrology recommends no dialysis at  this time and they recommend continuing the patient on a renal diet for now.  Plan is for cardiac catheterization in the a.m but patient's volume status is improved.  Cardiology is increasing his Norvasc today and they are planning on outpatient work-up for questionable cardiac amyloidosis.  Patient underwent cardiac cath on 05/31/2021.  Renal function continued to climb slowly so will need to monitor carefully but is now improving. Cardiology trying a dose of IV Lasix and continuing monitor of renal Fxn. He is on IV Lasix 80 mg BID. Nephrology now going to D/C Temp HD Cath.  Patient is being diuresed and Cr is slightly bumped to 3.0. Cardiology recommending continuing IV Lasix today and possibly converting to po Demadex.  He remains significantly volume overloaded and had extremely high pulmonary blood pressures they are recommending continue IV Lasix and increasing this to 120 mg p.o. twice daily  Assessment and Plan: * Acute respiratory failure ([place])- (present on admission) -PCCM Signed off  -Patient had Nodular Infiltrates on CXR/CT and was not consistent with pulmonary edema -Initially concerned with elevated PCT and because of lack of response to diuretics that could be infectious vs. Inflammatory process. -They initially felt that hydralazine induced pneumonitis was possible but he had a negative antihistone antibody. -His HIV was negative -Per pulmonary he had a similar nodular infiltrates dating back to 01/2021 -He continues to have residual infiltrates on plain films despite significant volume reduction -Autoimmune panel has been largely negative except elevated kappa lambda light chains -His ESR was 56 and his ANA was negative and RF was negative as well as ANCA being negative. -UDS was negative and RV panel was negative his protein/creatinine ratio was 1.39 and trended down to 0.40 with a negative histone antibody negative -His kappa light chain was 109.4 and his lambda was 40.6 with a  ratio being 2.69 -SpO2: 91 % O2 Flow Rate (L/min): 2 L/min (Rechecked saturation now with 2 L O2) FiO2 (%): 40 % -Per pulmonary continue weaning oxygen saturations greater than 90%; Now off of O2 -Follow intermittent chest x-ray repeat chest this AM showed "A right jugular catheter remains in place, terminating over the mid SVC. The cardiac silhouette remains enlarged. Interstitial and airspace opacities in the right greater than left lungs have slightly improved. No sizable pleural effusion or pneumothorax is identified." -We will consider repeat CT imaging once he is dry to evaluate the parenchyma nodular infiltrates and he will get a right heart cath on Friday to evaluate his pulmonary pressures; Will likely repeat noncontrast imaging once he is drier per PCCM recc's -Fluid removal with CRRT has been completed now he is at [facility] for intermittent hemodialysis and will defer dialysis to nephrology and they feel that there is no current indication for dialysis at this time and now getting IV Lasix managed by Cardiology -Continue to monitor respiratory status carefully and if necessary will place him back on nebs -We will order flutter valve and incentive spirometry -Continue to monitor blood pressures per protocol   Hyperkalemia -K+ improved from 5.3 -> 4.4 -> 4.2 -> 4.0 -Getting IV Lasix per Cards  and Nephro -Continue to Monitor and Trend and may need to get Lokelma.  -Continue to Monitor and Trend Renal Fxn  -Repeat CMP in the AM  Obesity (BMI 60-10.9) -Complicates overall prognosis and care -Estimated body mass index is 36.03 kg/m as calculated from the following:   Height as of this encounter: $RemoveBeforeD'5\' 8"'[id number]$  (1.727 m).   Weight as of this encounter: 107.5 kg.  -Weight Loss and Dietary Counseling given   Normocytic anemia - Patient's hemoglobin/hematocrit is relatively stable and and trend has gone from 9.7/30.4 -> 9.1/29.9 -> 9.4/30.3 -> 9.4/30.2 -> 10.3/32.9 -Likely anemia of chronic  kidney disease -Anemia panel was checked and iron level was 33, U IBC is 261, TIBC 294, saturation ratios of 11%, ferritin level 111, folate level 11.1, vitamin B12 626 -Nephrology recommending transfusing as needed hemoglobin for hemoglobin less than 7 -Continue to monitor for signs and symptoms bleeding; currently no overt bleeding noted -Repeat CBC in a.m.    Pulmonary hypertension ([place]) -Patient is to undergo a right heart cath today to evaluate his pulmonary pressures -His vasculitis panel was negative but his kappa and lambda light chains are slightly elevated -he is off of CRRT and has been transferred to [facility] for intermittent hemodialysis -Undergoing 24-hour urine protein collection and this is complete; Now getting a Urine Fixation  -Once he is euvolemic pulmonary recommended repeating CT scan to evaluate his parenchyma and nodular infiltrates; will consider this after his cardiac catheterization and once he has been diuresed -Fluid was removed via CRRT and will be now controlled with intermittent hemodialysis but renal recommends no dialysis at this time and his Temp Cath is being removed; Continue to Monitor Renal Fxn carefully -Patient was started on IV Lasix 80 mg twice daily and this is increased to 120 mg p.o. 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  He is getting a myeloma panel sent out and a 24-hour urine protein.  -SPEP had immunofixation that was normal  -His kappa and lambda light chains were elevated and his ratio was elevated.  -We will consider obtaining a fat pad biopsy and and/or bone marrow biopsy and will need to discuss with hematology further -echo was unchanged but showed an more vigorous elevated LV function. -He has elevated Lambda Light Chains  with Concern for Amyloidosis -Cardiology evaluating and he will need outpatient hematology referral and will discuss with them about next steps about possibly getting a bone marrow biopsy and abdominal fat pad biopsy while in house but will have further cardiac amyloidosis work-up in the outpatient setting -S/p Right heart cath 05/31/2021 -His antihistone testing was negative so his hydralazine has been resumed -Cardiology is now increasing IV Lasix 80 mg to IV 120 mg BID and this was done 06/03/21: Cardiology recommending continuing today    -Cardiology recommending continuing carvedilol 25 mg p.o. twice daily as well as Imdur 120 mg p.o. daily.  Recommending restarting amlodipine 5 mg p.o. daily if blood pressure still at goal and they are recommending starting minoxidil.  They also recommended clonidine 0.3 mg 3 times daily -Per cardiology no ACE/ARB/ARNI or mineralocorticoid receptor antagonist given his renal function -Cardiology recommended outpatient sleep study -Further plans per cardiology clinically suspect that he does not have amyloid and they feel that his restrictive cardiomyopathy is secondary to  hypertensive heart disease and poorly controlled blood pressure over time but they feel it is not unreasonable to consider hematology consultation regarding his elevated light chains in the outpatient setting; In the Interim they are ordering Urine Immunofixation and this is pending     Acute on chronic diastolic CHF (congestive heart failure) ([place])- (present on admission) -See Restrictive Cardiomyopathy -GI evaluating and recommending continuing Coreg 25 mg p.o. twice daily, Imdur 120 g p.o. daily they have increased her Norvasc to 10 mg p.o. daily.  They are recommending no ACEi/ARB/ARNI or MRA given his renal function worsening -Plan is for cardiac cath with a right heart catheterization 05/31/2021 and this was done today; Cath showed " Ao sat 90%, PA sat 62%, PA pressure 83/37, mean PA  pressure 54 mm Hg; mean PCWP 38 mm Hg; prominent V waves on wedge tracing; CO 7.9 L/min; CI 3.6. Severe systemic hypertension. Hemodynamic findings consistent with severe pulmonary hypertension. He still appears quite volume overloaded. rominent V wave on wedge tracing may represent restrictive physiology. Continue medical therapy including volume removal with dialysis."  -Further Care per Cards and will get IV Lasix per Cards and Nephrology Recc's; Received IV Lasix 80 and is now been increased to 120 mg twice daily -Strict I's and O's and Daily Weights; Patient is -30.708 Liters   Uncontrolled type 2 diabetes mellitus with hyperglycemia ([place])- (present on admission) -Patient was placed on a resistant sign scale insulin before meals and at bedtime and also has meal coverage with 4 units 3 times daily -CBGs ranging from 79-229 -HbA1c 6.6 -Continue monitor CBGs per protocol and adjust insulin as necessary  Hypertensive urgency- (present on admission) -Appreciate cardiology evaluation -Off of the nitro drip and Cardene drips -Cardiology has initiated carvedilol 25 mg p.o. twice daily, Imdur 120 mg p.o. daily, clonidine 0.3 mg p.o. 3 times daily -C/w Norvasc 10 mg p.o. daily and Cardiology recommending resuming minoxidil if blood pressure still remain uncontrolled and not at goal -Patient has been removed off of the hydralazine but since his antihistone testing is negative they discussed that it is safe for him to take the hydralazine but he refused it on 05/28/2021 -Patient get cardiology input and a right heart cath as well as some cardiac imaging at some point -Currently cardiology recommends no ACE inhibitor/ARB/ARNI or MRI given his renal function -Continue monitor blood pressures per protocol -Last Blood Pressure reading was 170/104   AKI (acute kidney injury) ([place]) -Has a history of CKD stage IIIb with a baseline creatinine 1.8-3 -His creatinine on admission was 2.4 and peaked at 3.9 so  CRRT was started on 05/23/2021 -Nephrology felt that he had a cardiorenal cause of his AKI and had some proteinuria which was quantified at 400 mg to 4 1300 mg p.o. daily approximately and they felt that this is in the nonnephrotic proteinuria. -He was started on CRRT 05/23/2018 for massive volume overload and was weaned off oxygen his chest x-ray had improved but he is back on oxygen again today -He was transferred to [facility] for intermittent hemodialysis and his CRRT was discontinued on 05/28/2021; Nephrology feels no indication for hemodialysis at this point and feel that he may have some renal recovery and may be plateauing -Patient is going to get IV Lasix 80 mg IV BID and will continue  -Continue strict I's and O's and daily weights -Patient's BUNs/creatinine went from 33/1.62 -> 34/1.76 -> 38/2.32 and is now elevated slightly at 51/2.60 -> 58/3.12 -> 58/2.88 -> 63/3.02 -> 61/3.03 ->  60/3.02 -Further care per nephrology and will avoid further nephrotoxic medication, contrast dyes if possible, hypotension renally dose medications -Repeat CMP in a.m. -Nephrology has now Signed off the Case and will have the patient follow up with [place] to Hosp-Admission (Current) from 05/18/2021 in [facility]  Nutrition Problem Increased nutrient needs  Etiology acute illness  Nutrition Goal Patient will meet greater than or equal to 90% of their needs  Interventions Ensure Enlive (each supplement provides 350kcal and 20 grams of protein)      Subjective: Seen and examined at bedside and he was doing okay.  He denied chest pain or shortness of breath.  Still appears volume overloaded.  Denies any other concerns or complaints at this time and he is diuresing fairly well.  Physical Exam: Vitals:   06/03/21 2050 06/04/21 0503 06/04/21 0505 06/04/21 0812  BP: (!) 148/93 (!) 156/97 (!) 156/97 (!) 170/104  Pulse: 64 80  79  Resp:  15     Temp: 98.3 F (36.8 C) 98.5 F (36.9 C)    TempSrc: Oral Oral    SpO2: 98%     Weight:      Height:       Examination: Physical Exam:  Constitutional: WN/WD obese AAM in  NAD and appears calm  Respiratory: Diminished to auscultation bilaterally with coarse breath sounds, no wheezing, rales, rhonchi or crackles. Normal respiratory effort and patient is not tachypenic. No accessory muscle use. Unlabored breathing  Cardiovascular: RRR, no murmurs / rubs / gallops. S1 and S2 auscultated. 1+ LE edema  Abdomen: Soft, non-tender, Distended. Bowel sounds positive.  GU: Deferred. Skin: No rashes, lesions, ulcers on a limited skin evaluation. No induration; Warm and dry.  Neurologic: CN 2-12 grossly intact with no focal deficits.  Data Reviewed:  I have independently reviewed and assessed the patient's clinical laboratory data including CBC and CMP.  His CBC shows that his hemoglobin/hematocrit stable at 10.3/32.9.  BUN/creatinine is stable at 60/3.02 and not really changed from yesterday.  Patient's phosphorus level is now 5.4  Family Communication: No family currently at bedside  Disposition: Status is: Inpatient Remains inpatient appropriate because: Continues to be massively volume overloaded and cardiology diuresing and needs to be back to dry weight  Planned Discharge Destination: Home with Home Health  DVT Prophylaxis: SCDs, Heparin 5000 units sq q8h  Author: Raiford Noble, DO [facility]  06/04/2021 11:07 AM  For on call review [email or web address].

## 2021-06-04 NOTE — Progress Notes (Signed)
Progress Note  Patient Name: Micheal Levy Date of Encounter: 06/04/2021  Primary Cardiologist: Berniece Salines, DO  Subjective   Net negative [id number] yesterday, -6.1L on admission.  BP 170/104 this morning.  Creatinine stable at 3.0 this morning.  Denies chest pain or dyspnea  Inpatient Medications    Scheduled Meds:  amLODipine  10 mg Oral Daily   B-complex with vitamin C  1 tablet Oral Daily   carvedilol  25 mg Oral BID WC   cloNIDine  0.3 mg Oral TID   feeding supplement  237 mL Oral BID BM   heparin  5,000 Units Subcutaneous Q8H   hydrALAZINE  75 mg Oral Q8H   insulin aspart  0-20 Units Subcutaneous TID WC   insulin aspart  0-5 Units Subcutaneous QHS   insulin aspart  4 Units Subcutaneous TID WC   isosorbide mononitrate  120 mg Oral Daily   mouth rinse  15 mL Mouth Rinse BID   pantoprazole  40 mg Oral QHS   sodium chloride flush  3 mL Intravenous Q12H   Continuous Infusions:  sodium chloride Stopped (05/24/21 2225)   furosemide 120 mg (06/04/21 0621)   PRN Meds: sodium chloride, acetaminophen, docusate sodium, labetalol, ondansetron (ZOFRAN) IV, polyethylene glycol, sodium chloride flush   Vital Signs    Vitals:   06/03/21 2050 06/04/21 0503 06/04/21 0505 06/04/21 0812  BP: (!) 148/93 (!) 156/97 (!) 156/97 (!) 170/104  Pulse: 64 80  79  Resp:  15    Temp: 98.3 F (36.8 C) 98.5 F (36.9 C)    TempSrc: Oral Oral    SpO2: 98%     Weight:      Height:        Intake/Output Summary (Last 24 hours) at 06/04/2021 0931 Last data filed at 06/04/2021 0815 Gross per 24 hour  Intake 402 ml  Output 2550 ml  Net -2148 ml    Filed Weights   06/01/21 0455 06/02/21 0523 06/03/21 0426  Weight: 107.4 kg 110.3 kg 105.6 kg    Telemetry    Sinus rhythm.  Personally reviewed.  ECG    No ECG reviewed.  Physical Exam   GEN: No acute distress.   Neck: +JVD. Cardiac: RRR, no murmur Respiratory: Decreased breath sounds at the bases.  GI: Soft, nontender MS: 2+  lower leg edema; No deformity. Neuro:  Nonfocal. Psych: Alert and oriented x 3. Normal affect.  Labs    Chemistry Recent Labs  Lab 06/02/21 0235 06/03/21 0311 06/04/21 0155  NA 137 138 136  K 4.4 4.2 4.0  CL 102 102 98  CO2 26 25 27   GLUCOSE 167* 129* 139*  BUN 63* 61* 60*  CREATININE 3.02* 3.03* 3.02*  CALCIUM 9.4 9.3 9.7  PROT 6.5 6.4* 7.1  ALBUMIN 2.9* 2.8* 3.3*  AST 17 15 22   ALT 35 30 30  ALKPHOS 65 68 71  BILITOT 0.4 0.7 0.6  GFRNONAA 26* 25* 26*  ANIONGAP 9 11 11       Hematology Recent Labs  Lab 06/02/21 0235 06/03/21 0311 06/04/21 0155  WBC 7.4 8.1 8.2  RBC 3.66* 3.62* 3.95*  HGB 9.4* 9.4* 10.3*  HCT 30.3* 30.2* 32.9*  MCV 82.8 83.4 83.3  MCH 25.7* 26.0 26.1  MCHC 31.0 31.1 31.3  RDW 18.6* 18.6* 18.6*  PLT 187 180 221     Cardiac Enzymes Recent Labs  Lab 05/08/21 1634 05/08/21 1840 05/18/21 0008 05/18/21 0250 05/18/21 0606  TROPONINIHS 24* 23* 26* 48* 68*  Radiology    DG CHEST PORT 1 VIEW  Result Date: 06/02/2021 CLINICAL DATA:  Dyspnea EXAM: PORTABLE CHEST 1 VIEW COMPARISON:  06/02/2021 FINDINGS: Lungs are symmetrically well expanded. Superimposed asymmetric airspace infiltrates, more severe within the right lung, appear slightly improved since prior examination and significantly improved since remote prior examination of 05/23/2021 in keeping with mild residual edema or inflammatory infiltrate. No pneumothorax or pleural effusion. Cardiomegaly is stable. No acute bone abnormality. IMPRESSION: Slight interval improvement in bilateral asymmetric airspace infiltrate, likely representing residual edema or inflammatory infiltrate Stable cardiomegaly Electronically Signed   By: Fidela Salisbury M.D.   On: 06/02/2021 19:58    Cardiac Studies   Echocardiogram 05/18/2021:  1. Left ventricular ejection fraction, by estimation, is 70 to 75%. The  left ventricle has hyperdynamic function. The left ventricle has no  regional wall motion  abnormalities. There is mild concentric left  ventricular hypertrophy. Left ventricular  diastolic parameters are indeterminate. Elevated left ventricular  end-diastolic pressure. The average left ventricular global longitudinal  strain is -24.8 %. The global longitudinal strain is normal.   2. Right ventricular systolic function is normal. The right ventricular  size is mildly enlarged. There is moderately elevated pulmonary artery  systolic pressure. The estimated right ventricular systolic pressure is  123456 mmHg.   3. The mitral valve is normal in structure. No evidence of mitral valve  regurgitation. No evidence of mitral stenosis.   4. The aortic valve is normal in structure. Aortic valve regurgitation is  not visualized. No aortic stenosis is present.   5. Aortic dilatation noted. There is mild dilatation of the ascending  aorta, measuring 38 mm.   6. The inferior vena cava is dilated in size with <50% respiratory  variability, suggesting right atrial pressure of 15 mmHg.   Assessment & Plan    HFpEF with acute on chronic diastolic heart failure and associated severe pulmonary hypertension, WHO group 2 based on recent right heart catheterization.  Concurrent question of AL amyloidosis based on elevated free light chains however SPEP with immunofixation normal.  Echocardiographic findings are not overly suggestive of amyloidosis including global longitudinal strain pattern. Case was discussed with Dr. Haroldine Laws last week, may be an issue of restrictive cardiomyopathy with hypertensive heart disease and poorly controlled blood pressure over time.  Significantly elevated pressures on RHC 2/24: RA 22, RV 78/8, PA 83/37, PCWP 38, PA sat 62%, CI 3.6.   -Remains volume overloaded, markedly elevated pressures on RHC 2/24. Good response to IV lasix 120 mg yesterday with stable renal function, would continue IV Lasix 120 mg BID  Hypertensive emergency complicating presentation with severe  hypertension at baseline.  BP remains elevated.  Currently on carvedilol 25 mg twice daily, clonidine 0.3 mg 3 times daily, IV Lasix 80 mg twice daily, hydralazine 75 mg 3 times daily  Acute kidney injury on CKD stage IIIb.  Followed by nephrology and required CRRT initially for volume overload, has not required hemodialysis at this point.  Creatinine currently stable at 3.0  Acute hypoxic respiratory failure, initially managed for pneumonia and pulmonary edema.  Resolved, on room air.  Substance abuse history, in remission (previously cocaine and marijuana).   Signed, Donato Heinz, MD  06/04/2021, 9:31 AM

## 2021-06-05 DIAGNOSIS — E875 Hyperkalemia: Secondary | ICD-10-CM

## 2021-06-05 LAB — GLUCOSE, CAPILLARY
Glucose-Capillary: 143 mg/dL — ABNORMAL HIGH (ref 70–99)
Glucose-Capillary: 210 mg/dL — ABNORMAL HIGH (ref 70–99)
Glucose-Capillary: 109 mg/dL — ABNORMAL HIGH (ref 70–99)
Glucose-Capillary: 143 mg/dL — ABNORMAL HIGH (ref 70–99)

## 2021-06-05 LAB — MAGNESIUM: Magnesium: 1.7 mg/dL (ref 1.7–2.4)

## 2021-06-05 LAB — CBC WITH DIFFERENTIAL/PLATELET
Abs Immature Granulocytes: 0.08 10*3/uL — ABNORMAL HIGH (ref 0.00–0.07)
Basophils Absolute: 0.1 10*3/uL (ref 0.0–0.1)
Basophils Relative: 1 %
Eosinophils Absolute: 0.2 10*3/uL (ref 0.0–0.5)
Eosinophils Relative: 2 %
HCT: 30.7 % — ABNORMAL LOW (ref 39.0–52.0)
Hemoglobin: 9.6 g/dL — ABNORMAL LOW (ref 13.0–17.0)
Immature Granulocytes: 1 %
Lymphocytes Relative: 30 %
Lymphs Abs: 2.3 10*3/uL (ref 0.7–4.0)
MCH: 26 pg (ref 26.0–34.0)
MCHC: 31.3 g/dL (ref 30.0–36.0)
MCV: 83.2 fL (ref 80.0–100.0)
Monocytes Absolute: 1.4 10*3/uL — ABNORMAL HIGH (ref 0.1–1.0)
Monocytes Relative: 18 %
Neutro Abs: 3.8 10*3/uL (ref 1.7–7.7)
Neutrophils Relative %: 48 %
Platelets: 212 10*3/uL (ref 150–400)
RBC: 3.69 MIL/uL — ABNORMAL LOW (ref 4.22–5.81)
RDW: 18.7 % — ABNORMAL HIGH (ref 11.5–15.5)
WBC: 7.9 10*3/uL (ref 4.0–10.5)
nRBC: 0 % (ref 0.0–0.2)

## 2021-06-05 LAB — PHOSPHORUS: Phosphorus: 5.4 mg/dL — ABNORMAL HIGH (ref 2.5–4.6)

## 2021-06-05 LAB — COMPREHENSIVE METABOLIC PANEL
ALT: 25 U/L (ref 0–44)
AST: 19 U/L (ref 15–41)
Albumin: 3 g/dL — ABNORMAL LOW (ref 3.5–5.0)
Alkaline Phosphatase: 62 U/L (ref 38–126)
Anion gap: 10 (ref 5–15)
BUN: 58 mg/dL — ABNORMAL HIGH (ref 6–20)
CO2: 28 mmol/L (ref 22–32)
Calcium: 9.4 mg/dL (ref 8.9–10.3)
Chloride: 99 mmol/L (ref 98–111)
Creatinine, Ser: 2.79 mg/dL — ABNORMAL HIGH (ref 0.61–1.24)
GFR, Estimated: 28 mL/min — ABNORMAL LOW (ref >60)
Glucose, Bld: 149 mg/dL — ABNORMAL HIGH (ref 70–99)
Potassium: 3.5 mmol/L (ref 3.5–5.1)
Sodium: 137 mmol/L (ref 135–145)
Total Bilirubin: 0.5 mg/dL (ref 0.3–1.2)
Total Protein: 6.7 g/dL (ref 6.5–8.1)

## 2021-06-05 LAB — IMMUNOFIXATION, URINE

## 2021-06-05 MED ORDER — MAGNESIUM SULFATE IN D5W 1-5 GM/100ML-% IV SOLN
1.0000 g | Freq: Once | INTRAVENOUS | Status: AC
  Administered 2021-06-05: 1 g via INTRAVENOUS
  Filled 2021-06-05: qty 100

## 2021-06-05 MED ORDER — POTASSIUM CHLORIDE CRYS ER 20 MEQ PO TBCR
40.0000 meq | EXTENDED_RELEASE_TABLET | Freq: Once | ORAL | Status: AC
  Administered 2021-06-05: 40 meq via ORAL
  Filled 2021-06-05: qty 2

## 2021-06-05 NOTE — Progress Notes (Signed)
? ?Progress Note ? ?Patient Name: DREYSON LEAMY ?Date of Encounter: 06/05/2021 ? ?Primary Cardiologist: Berniece Salines, DO ? ?Subjective  ? ?Net negative 3.1L yesterday, -29L on admission.  BP 159/102 this morning.  Creatinine improved (3.0>2.8) this morning.  Denies chest pain or dyspnea ? ?Inpatient Medications  ?  ?Scheduled Meds: ? amLODipine  10 mg Oral Daily  ? B-complex with vitamin C  1 tablet Oral Daily  ? carvedilol  25 mg Oral BID WC  ? cloNIDine  0.3 mg Oral TID  ? feeding supplement  237 mL Oral BID BM  ? heparin  5,000 Units Subcutaneous Q8H  ? hydrALAZINE  75 mg Oral Q8H  ? insulin aspart  0-20 Units Subcutaneous TID WC  ? insulin aspart  0-5 Units Subcutaneous QHS  ? insulin aspart  4 Units Subcutaneous TID WC  ? isosorbide mononitrate  120 mg Oral Daily  ? mouth rinse  15 mL Mouth Rinse BID  ? pantoprazole  40 mg Oral QHS  ? sodium chloride flush  3 mL Intravenous Q12H  ? ?Continuous Infusions: ? sodium chloride Stopped (05/24/21 2225)  ? furosemide 120 mg (06/05/21 [id number])  ? ?PRN Meds: ?sodium chloride, acetaminophen, docusate sodium, labetalol, ondansetron (ZOFRAN) IV, polyethylene glycol, sodium chloride flush  ? ?Vital Signs  ?  ?Vitals:  ? 06/04/21 1454 06/04/21 1638 06/04/21 2029 06/05/21 0550  ?BP: (!) 151/88 (!) 141/89 (!) 155/91 (!) 159/102  ?Pulse:  71 77 74  ?Resp:   16 20  ?Temp:   98.2 ?F (36.8 ?C) 98 ?F (36.7 ?C)  ?TempSrc:   Oral Oral  ?SpO2:   98%   ?Weight:    102.9 kg  ?Height:      ? ? ?Intake/Output Summary (Last 24 hours) at 06/05/2021 0801 ?Last data filed at 06/05/2021 0443 ?Gross per 24 hour  ?Intake 124 ml  ?Output 3175 ml  ?Net -3051 ml  ? ? ?Filed Weights  ? 06/02/21 0523 06/03/21 0426 06/05/21 0550  ?Weight: 110.3 kg 105.6 kg 102.9 kg  ? ? ?Telemetry  ?  ?Sinus rhythm.  Personally reviewed. ? ?ECG  ?  ?No ECG reviewed. ? ?Physical Exam  ? ?GEN: No acute distress.   ?Neck: +JVD. ?Cardiac: RRR, no murmur ?Respiratory: Decreased breath sounds at the bases.  ?GI: Soft,  nontender ?MS: 2+ lower leg edema; No deformity. ?Neuro:  Nonfocal. ?Psych: Alert and oriented x 3. Normal affect. ? ?Labs  ?  ?Chemistry ?Recent Labs  ?Lab 06/03/21 ?0311 06/04/21 ?0155 06/05/21 ?0218  ?NA 138 136 137  ?K 4.2 4.0 3.5  ?CL 102 98 99  ?CO2 25 27 28   ?GLUCOSE 129* 139* 149*  ?BUN 61* 60* 58*  ?CREATININE 3.03* 3.02* 2.79*  ?CALCIUM 9.3 9.7 9.4  ?PROT 6.4* 7.1 6.7  ?ALBUMIN 2.8* 3.3* 3.0*  ?AST 15 22 19   ?ALT 30 30 25   ?ALKPHOS 68 71 62  ?BILITOT 0.7 0.6 0.5  ?GFRNONAA 25* 26* 28*  ?ANIONGAP 11 11 10   ? ?  ? ?Hematology ?Recent Labs  ?Lab 06/03/21 ?0311 06/04/21 ?0155 06/05/21 ?0218  ?WBC 8.1 8.2 7.9  ?RBC 3.62* 3.95* 3.69*  ?HGB 9.4* 10.3* 9.6*  ?HCT 30.2* 32.9* 30.7*  ?MCV 83.4 83.3 83.2  ?MCH 26.0 26.1 26.0  ?MCHC 31.1 31.3 31.3  ?RDW 18.6* 18.6* 18.7*  ?PLT 180 221 212  ? ? ? ?Cardiac Enzymes ?Recent Labs  ?Lab 05/08/21 ?1634 05/08/21 ?1840 05/18/21 ?0008 05/18/21 ?0250 05/18/21 ?0606  ?TROPONINIHS 24* 23* 26* 48* 68*  ? ? ? ?  Radiology  ?  ?No results found. ? ?Cardiac Studies  ? ?Echocardiogram 05/18/2021: ? 1. Left ventricular ejection fraction, by estimation, is 70 to 75%. The  ?left ventricle has hyperdynamic function. The left ventricle has no  ?regional wall motion abnormalities. There is mild concentric left  ?ventricular hypertrophy. Left ventricular  ?diastolic parameters are indeterminate. Elevated left ventricular  ?end-diastolic pressure. The average left ventricular global longitudinal  ?strain is -24.8 %. The global longitudinal strain is normal.  ? 2. Right ventricular systolic function is normal. The right ventricular  ?size is mildly enlarged. There is moderately elevated pulmonary artery  ?systolic pressure. The estimated right ventricular systolic pressure is  ?123456 mmHg.  ? 3. The mitral valve is normal in structure. No evidence of mitral valve  ?regurgitation. No evidence of mitral stenosis.  ? 4. The aortic valve is normal in structure. Aortic valve regurgitation is  ?not  visualized. No aortic stenosis is present.  ? 5. Aortic dilatation noted. There is mild dilatation of the ascending  ?aorta, measuring 38 mm.  ? 6. The inferior vena cava is dilated in size with <50% respiratory  ?variability, suggesting right atrial pressure of 15 mmHg.  ? ?Assessment & Plan  ?  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  Concurrent question of AL amyloidosis based on elevated free light chains however SPEP with immunofixation normal.  Urine immunofixation pending. Echocardiographic findings are not overly suggestive of amyloidosis including global longitudinal strain pattern. Case was discussed with Dr. Haroldine Laws last week, may be an issue of restrictive cardiomyopathy with hypertensive heart disease and poorly controlled blood pressure over time.  Significantly elevated pressures on RHC 2/24: RA 22, RV 78/8, PA 83/37, PCWP 38, PA sat 62%, CI 3.6.   ?-Remains volume overloaded, markedly elevated pressures on RHC 2/24. Renal function improved today, would continue IV lasix 120 mg BID ?-Will replete for K>4, Mag>2 ? ?Hypertensive emergency complicating presentation with severe hypertension at baseline.  BP remains elevated.  Currently on carvedilol 25 mg twice daily, clonidine 0.3 mg 3 times daily, IV Lasix 120 mg twice daily, hydralazine 75 mg 3 times daily ? ?Acute kidney injury on CKD stage IIIb.  Followed by nephrology and required CRRT initially for volume overload, has not required hemodialysis at this point.  Creatinine currently 2.8 ? ?Acute hypoxic respiratory failure, initially managed for pneumonia and pulmonary edema.  Resolved, on room air. ? ?Substance abuse history, in remission (previously cocaine and marijuana). ? ? ?Signed, ?Donato Heinz, MD  ?06/05/2021, 8:01 AM    ?

## 2021-06-05 NOTE — Progress Notes (Signed)
PROGRESS NOTE    Micheal Levy  FXT:[id number] DOB: 1980/06/24 DOA: 05/18/2021 PCP: [name], NP    Chief Complaint  Patient presents with   Shortness of Breath    Brief Narrative:  The patient is a 41 year old obese African-American male with a past medical history significant for but limited to diabetes mellitus type 2, hyperlipidemia, hypertension, history of CKD stage IIIa with baseline creatinine around 3, chronic diastolic CHF with last EF of 55 to 60% and grade 3 diastolic dysfunction noted on echo back in October 2022, history of substance abuse as well as other comorbidities who presented to the [name] ED on 05/18/2021 for respiratory distress.  He was recently hospitalized from 200-4 2023 for acute on chronic diastolic CHF acute on chronic kidney disease and at that time presented with dyspnea, PND, worsening pedal edema and elevated BNP.  His renal ultrasound at that time was within normal limits and he was treated with IV Lasix and transition to p.o. on discharge home.  He returns back on 05/18/2021 with worsening respiratory distress and initially was treated as an asthma exacerbation with epinephrine by EMS found to be hypertensive with systolic blood pressure in the 180s to 299M and diastolic blood pressure 426.  ABG was done and showed respiratory acidosis with a mixed picture with saturations in the 80s.  Chest x-ray was concerning for pulmonary edema and he had significant crackles on examination.  He was admitted for acute hypoxic respiratory failure due to bilateral infiltrates with arts and pulmonary hypertension.  Patient was started on nitro drip and transition to nicardipine for systolic blood pressure 834.  At that time he was given Lasix 40 mg x 1.  PCCM was consulted for admission in the setting of respiratory distress and hypertensive emergency and he was admitted on 05/18/2018 and then on 05/20/2021 he was placed on BiPAP 18/6 with 60% FiO2.  05/21/2021 he had  left IJ central venous catheter placed.  He remained on Cardene and 05/22/2021 his Cardene drip started being down titrated and his p.o. antihypertensives were uptitrated.  His respiratory status at that time was stable on 15 L high flow nasal cannula and he still had improvement but with marginal urine output with aggressive diuresis.  On 05/23/2021 he had continued improvement in his O2 needs but remained hypertensive off Cardene and nephrology was consulted for HD/CRRT to optimize for right heart cath.  His right IJ catheter was changed out to a trialysis catheter and on 05/24/2021 he initiated CRRT and had 3.2 L removed and his O2 saturations we will wean down to 10 L and he tolerated this well.  On 05/26/2021 he subjectively felt better but his chest x-ray did not change and had elevated blood pressure despite multiple agents and remained on CRRT.  On tube 22,023 he was weaned on 2 L of nasal cannula and he was net 5.9 L and felt better but subsequently he was transferred to [facility] for right heart catheterization and initiation of intermittent hemodialysis.  Cardiology evaluated the patient and planning for a heart catheterization on Friday.  Patient's volume will has improved and his chest x-ray is better and he is down 15 to 20 kg with CRRT.  Plan is for right heart cath scheduled for Friday and patient has initiated 24-hour urine protein collection.  The recommendation is for an abdominal fat pad/bone marrow biopsy for amyloid and the PCCM PA reviewed the case with hematology and they felt that the light chains  could be some degree of poor clearance of renal failure and recommending further biopsy to rule out other disease processes but given this a myeloma panel will be sent at 24-hour urine for completeness.  May discuss with IR about a fat pad biopsy/bone marrow biopsy but will await his right heart cath first.  Patient's renal function is mildly elevated today and may be stabilizing.  Nephrology  recommends no dialysis at this time and they recommend continuing the patient on a renal diet for now.  Plan is for cardiac catheterization in the a.m but patient's volume status is improved.  Cardiology is increasing his Norvasc today and they are planning on outpatient work-up for questionable cardiac amyloidosis.  Patient underwent cardiac cath on 05/31/2021.  Renal function continued to climb slowly so will need to monitor carefully but is now improving. Cardiology trying a dose of IV Lasix and continuing monitor of renal Fxn. He is on IV Lasix 80 mg BID. Nephrology now going to D/C Temp HD Cath.  Patient is being diuresed and Cr is slightly bumped to 3.0. Cardiology recommending continuing IV Lasix today and possibly converting to po Demadex.  He remains significantly volume overloaded and had extremely high pulmonary blood pressures they are recommending continue IV Lasix and increasing this to 120 mg p.o. twice daily    Assessment & Plan:  Principal Problem:   Acute respiratory failure (HCC) Active Problems:   AKI (acute kidney injury) ([place])   Hypertensive urgency   Uncontrolled type 2 diabetes mellitus with hyperglycemia (HCC)   Acute on chronic diastolic CHF (congestive heart failure) (HCC)   Restrictive cardiomyopathy (HCC)   Acute kidney injury superimposed on CKD ([place])   Pulmonary hypertension (HCC)   Normocytic anemia   Obesity (BMI 30-39.9)   Hyperkalemia    Assessment and Plan: * Acute respiratory failure ([place])- (present on admission) -PCCM Signed off  -Patient had Nodular Infiltrates on CXR/CT and was not consistent with pulmonary edema -Initially concerned with elevated PCT and because of lack of response to diuretics that could be infectious vs. Inflammatory process. -They initially felt that hydralazine induced pneumonitis was possible but he had a negative antihistone antibody. -His HIV was negative -Per pulmonary he had a similar nodular infiltrates dating back to  01/2021 -He continues to have residual infiltrates on plain films despite significant volume reduction and clinical improvement. -Autoimmune panel has been largely negative except elevated kappa lambda light chains -His ESR was 56 and his ANA was negative and RF was negative as well as ANCA being negative. -UDS was negative and RV panel was negative his protein/creatinine ratio was 1.39 and trended down to 0.40 with a negative histone antibody negative -His kappa light chain was 109.4 and his lambda was 40.6 with a ratio being 2.69 -Patient oxygenation is improved currently on room air with sats of 96%. -Per pulmonary continue weaning oxygen saturations greater than 90%; Now off of O2 -Follow intermittent chest x-ray repeat chest this AM showed "A right jugular catheter remains in place, terminating over the mid SVC. The cardiac silhouette remains enlarged. Interstitial and airspace opacities in the right greater than left lungs have slightly improved. No sizable pleural effusion or pneumothorax is identified." -We will consider repeat CT imaging once he is dry to evaluate the parenchyma nodular infiltrates and he will get a right heart cath on Friday to evaluate his pulmonary pressures; Will likely repeat noncontrast imaging once he is drier per PCCM recc's -Fluid removal with CRRT has been completed now  he is at [facility] for intermittent hemodialysis and will defer dialysis to nephrology and they feel that there is no current indication for dialysis at this time and now getting IV Lasix managed by Cardiology -Continue to monitor respiratory status carefully and if necessary will place him back on nebs. -Patient with urine output of 5 L over the past 24 hours and is -35.850 L during this hospitalization. -Continue IV Lasix, flutter valve, incentive spirometry. -Follow BP.   Hyperkalemia -K+ improved from 5.3 -> 4.4 -> 4.2 -> 4.0>> 3.5. -Patient on IV Lasix. -Repeat labs in the a.m. May need  oral potassium supplementation as on high-dose IV Lasix.  Obesity (BMI 17-00.1) -Complicates overall prognosis and care -Estimated body mass index is 36.03 kg/m as calculated from the following:   Height as of this encounter: $RemoveBeforeD'5\' 8"'[id number]$  (1.727 m).   Weight as of this encounter: 107.5 kg.  -Weight Loss and Dietary Counseling given   Normocytic anemia - Patient's hemoglobin/hematocrit is relatively stable and and trend has gone from 9.7/30.4 -> 9.1/29.9 -> 9.4/30.3 -> 9.4/30.2 -> 10.3/32.9>> 9.6/30.7 -Likely anemia of chronic kidney disease -Anemia panel was checked and iron level was 33, U IBC is 261, TIBC 294, saturation ratios of 11%, ferritin level 111, folate level 11.1, vitamin B12 626 -Nephrology recommended transfusing as needed hemoglobin for hemoglobin less than 7 -Continue to monitor for signs and symptoms bleeding; currently no overt bleeding noted -Repeat CBC in a.m.    Pulmonary hypertension (HCC) -Patient status post right heart cath. -His vasculitis panel was negative but his kappa and lambda light chains are slightly elevated -he is off of CRRT and has been transferred to [facility] for intermittent hemodialysis -Undergoing 24-hour urine protein collection and this is complete; urine immunofixation ordered and pending. -Once he is euvolemic pulmonary recommended repeating CT scan to evaluate his parenchyma and nodular infiltrates; will consider this after his cardiac catheterization and once he has been diuresed -Fluid was removed via CRRT and will be now controlled with intermittent hemodialysis but renal recommends no dialysis at this time and his Temp Cath has been removed; Continue to Monitor Renal Fxn carefully. -Was on Lasix 80 mg IV every 12 hours and this has been increased to 120 mg IV every 12 hours per cardiology recommendations.  Acute kidney injury superimposed on CKD ([place]) -See AKI  Restrictive cardiomyopathy ([place]) - There is clinical suspicion for  hypertensive heart disease but needs to rule out amyloid and he has a hematology referral.  He is getting a myeloma panel sent out and a 24-hour urine protein.  -SPEP had immunofixation that was normal  -His kappa and lambda light chains were elevated and his ratio was elevated.  -We will consider obtaining a fat pad biopsy and and/or bone marrow biopsy and will need to discuss with hematology further -echo was unchanged but showed an more vigorous elevated LV function. -He has elevated Lambda Light Chains with Concern for Amyloidosis -Cardiology evaluating and he will need outpatient hematology referral and will discuss with them about next steps about possibly getting a bone marrow biopsy and abdominal fat pad biopsy while in house but will have further cardiac amyloidosis work-up in the outpatient setting -S/p Right heart cath 05/31/2021 -His antihistone testing was negative so his hydralazine has been resumed -Cardiology increased IV Lasix 80 mg to IV 120 mg BID and this was done 06/03/21: -Cardiology recommending continuation of current dose of IV Lasix.     -Cardiology recommending continuing carvedilol 25 mg  p.o. twice daily as well as Imdur 120 mg p.o. daily, Norvasc, clonidine, hydralazine. -Per cardiology blood pressure not at goal may consider addition of minoxidil. -Per cardiology no ACE/ARB/ARNI or mineralocorticoid receptor antagonist given his renal function -Cardiology recommended outpatient sleep study -Further plans per cardiology clinically suspect that he does not have amyloid and they feel that his restrictive cardiomyopathy is secondary to hypertensive heart disease and poorly controlled blood pressure over time but they feel it is not unreasonable to consider hematology consultation regarding his elevated light chains in the outpatient setting; In the Interim they are ordering Urine Immunofixation and currently pending.    Acute on chronic diastolic CHF (congestive heart  failure) ([place])- (present on admission) -See Restrictive Cardiomyopathy -Cardiology consulted and recommending continuing Coreg 25 mg p.o. twice daily, Imdur 120 g p.o. daily they have increased her Norvasc to 10 mg p.o. daily.  They are recommending no ACEi/ARB/ARNI or MRA given his renal function worsening -Status post right heart catheterization 05/31/2021. - Cath showed " Ao sat 90%, PA sat 62%, PA pressure 83/37, mean PA pressure 54 mm Hg; mean PCWP 38 mm Hg; prominent V waves on wedge tracing; CO 7.9 L/min; CI 3.6. Severe systemic hypertension. Hemodynamic findings consistent with severe pulmonary hypertension. He still appears quite volume overloaded. rominent V wave on wedge tracing may represent restrictive physiology. Continue medical therapy including volume removal with dialysis."  -Further Care per Cards and IV Lasix increased to 120 mg twice daily.  -Patient with urine output of 3.175 L over the past 24 hours. -Patient is -35.850 L during this hospitalization. -Strict I's and O's and Daily Weights;   Uncontrolled type 2 diabetes mellitus with hyperglycemia ([place])- (present on admission) -Hemoglobin A1c 6.6 (05/21/2021 ). -CBG 143 this morning.   -Patient was placed on a resistant sign scale insulin before meals and at bedtime and also has meal coverage with 4 units 3 times daily. -Continue monitor CBGs per protocol and adjust insulin as necessary.  Hypertensive urgency- (present on admission) -Appreciate cardiology evaluation -Off of the nitro drip and Cardene drips -Cardiology has initiated carvedilol 25 mg p.o. twice daily, Imdur 120 mg p.o. daily, clonidine 0.3 mg p.o. 3 times daily, Norvasc 10 mg daily, hydralazine 75mg  3 times daily. -Cardiology recommending resuming minoxidil if blood pressure still remain uncontrolled and not at goal -Patient had been removed off of the hydralazine but since his antihistone testing was negative they discussed that it was safe for him to take  the hydralazine but he refused it on 05/28/2021 -Patient get cardiology input and a right heart cath as well as some cardiac imaging at some point -Currently cardiology recommends no ACE inhibitor/ARB/ARNI or MRI given his renal function -Continue monitor blood pressures per protocol.   AKI (acute kidney injury) ([place]) -Has a history of CKD stage IIIb with a baseline creatinine 1.8-3 -His creatinine on admission was 2.4 and peaked at 3.9 so CRRT was started on 05/23/2021 -Nephrology felt that he had a cardiorenal cause of his AKI and had some proteinuria which was quantified at 400 mg to 4 1300 mg p.o. daily approximately and they felt that this is in the nonnephrotic proteinuria. -He was started on CRRT 05/23/2018 for massive volume overload and was weaned off oxygen his chest x-ray had improved. -He was transferred to [facility] for intermittent hemodialysis and his CRRT was discontinued on 05/28/2021; Nephrology feels no indication for hemodialysis at this point and feel that he may have some renal recovery and may be  plateauing -Patient currently on Lasix 120 mg IV twice daily.   -Continue strict I's and O's and daily weights -Patient's BUNs/creatinine went from 33/1.62 -> 34/1.76 -> 38/2.32 and is now elevated slightly at 51/2.60 -> 58/3.12 -> 58/2.88 -> 63/3.02 -> 61/3.03 -> 60/3.02>>> 58/2.79 -Further care per nephrology and will avoid further nephrotoxic medication, contrast dyes if possible, hypotension renally dose medications -Repeat CMP in a.m. -Nephrology has now Signed off the Case and will have the patient follow up with [facility] in the outpatient setting          DVT prophylaxis: Heparin Code Status: Full Family Communication: Updated patient.  No family at bedside. Disposition: TBD  Status is: Inpatient Remains inpatient appropriate because: Severity of illness           Consultants:  Nephrology: Dr. Candiss Norse 05/20/2021 Cardiology: Dr. Gasper Sells  05/27/2021 PCCM  Procedures:  CT chest 05/21/2021 Renal ultrasound 05/20/2021 2D echo 05/18/2021 Right heart cardiac catheterization 05/31/2021 Renal artery duplex 05/21/2021   Antimicrobials:  IV cefepime 05/18/2021>>>> 05/24/2021  Anti-infectives (From admission, onward)    Start     Dose/Rate Route Frequency Ordered Stop   05/18/21 1030  ceFEPIme (MAXIPIME) 2 g in sodium chloride 0.9 % 100 mL IVPB        2 g 200 mL/hr over 30 Minutes Intravenous Every 12 hours 05/18/21 0937 05/24/21 2300         Subjective: Sitting up in bed on the telephone.  Still with some shortness of breath however improved significantly since admission.  No chest pain.  No abdominal pain.  Tolerating oral intake.  Currently on room air with sats of 96%.  Objective: Vitals:   06/05/21 0550 06/05/21 0857 06/05/21 1319 06/05/21 1606  BP: (!) 159/102 (!) 153/99 (!) 141/85 (!) 137/95  Pulse: 74 74 71 74  Resp: $Remo'20 20 19 20  'WDIMM$ Temp: 98 F (36.7 C) 98.2 F (36.8 C) 97.9 F (36.6 C) 97.8 F (36.6 C)  TempSrc: Oral Oral Oral Oral  SpO2:   96% 95%  Weight: 102.9 kg     Height:        Intake/Output Summary (Last 24 hours) at 06/05/2021 1805 Last data filed at 06/05/2021 1318 Gross per 24 hour  Intake 465 ml  Output 3550 ml  Net -3085 ml   Filed Weights   06/02/21 0523 06/03/21 0426 06/05/21 0550  Weight: 110.3 kg 105.6 kg 102.9 kg    Examination:  General exam: NAD. Respiratory system: Bibasilar crackles.  No wheezing.  Fair air movement.  Speaking in full sentences. Cardiovascular system: Regular rate rhythm no murmurs rubs or gallops.  1-2+ bilateral lower extremity edema.  Gastrointestinal system: Abdomen is nondistended, soft and nontender. No organomegaly or masses felt. Normal bowel sounds heard. Central nervous system: Alert and oriented. No focal neurological deficits. Extremities: Symmetric 5 x 5 power. Skin: No rashes, lesions or ulcers Psychiatry: Judgement and insight appear normal. Mood &  affect appropriate.     Data Reviewed:   CBC: Recent Labs  Lab 06/01/21 0424 06/02/21 0235 06/03/21 0311 06/04/21 0155 06/05/21 0218  WBC 7.9 7.4 8.1 8.2 7.9  NEUTROABS 5.0 4.6 4.9 4.5 3.8  HGB 9.1* 9.4* 9.4* 10.3* 9.6*  HCT 29.9* 30.3* 30.2* 32.9* 30.7*  MCV 84.5 82.8 83.4 83.3 83.2  PLT 184 187 180 221 245    Basic Metabolic Panel: Recent Labs  Lab 06/01/21 0424 06/02/21 0235 06/03/21 0311 06/04/21 0155 06/05/21 0218  NA 136 137 138 136 137  K 5.3*  4.4 4.2 4.0 3.5  CL 104 102 102 98 99  CO2 $Re'23 26 25 27 28  'oLO$ GLUCOSE 132* 167* 129* 139* 149*  BUN 58* 63* 61* 60* 58*  CREATININE 2.88* 3.02* 3.03* 3.02* 2.79*  CALCIUM 9.2 9.4 9.3 9.7 9.4  MG 2.4 2.1 1.8 2.2 1.7  PHOS 5.4* 5.7* 5.6* 5.4* 5.4*    GFR: Estimated Creatinine Clearance: 40.1 mL/min (A) (by C-G formula based on SCr of 2.79 mg/dL (H)).  Liver Function Tests: Recent Labs  Lab 06/01/21 0424 06/02/21 0235 06/03/21 0311 06/04/21 0155 06/05/21 0218  AST $Re'19 17 15 22 19  'xKm$ ALT 39 35 $Rem'30 30 25  'IWpw$ ALKPHOS 58 65 68 71 62  BILITOT 0.3 0.4 0.7 0.6 0.5  PROT 6.2* 6.5 6.4* 7.1 6.7  ALBUMIN 2.7* 2.9* 2.8* 3.3* 3.0*    CBG: Recent Labs  Lab 06/04/21 1822 06/04/21 2152 06/05/21 0853 06/05/21 1232 06/05/21 1717  GLUCAP 98 159* 143* 210* 109*     No results found for this or any previous visit (from the past 240 hour(s)).       Radiology Studies: No results found.      Scheduled Meds:  amLODipine  10 mg Oral Daily   B-complex with vitamin C  1 tablet Oral Daily   carvedilol  25 mg Oral BID WC   cloNIDine  0.3 mg Oral TID   feeding supplement  237 mL Oral BID BM   heparin  5,000 Units Subcutaneous Q8H   hydrALAZINE  75 mg Oral Q8H   insulin aspart  0-20 Units Subcutaneous TID WC   insulin aspart  0-5 Units Subcutaneous QHS   insulin aspart  4 Units Subcutaneous TID WC   isosorbide mononitrate  120 mg Oral Daily   mouth rinse  15 mL Mouth Rinse BID   pantoprazole  40 mg Oral QHS   sodium  chloride flush  3 mL Intravenous Q12H   Continuous Infusions:  sodium chloride Stopped (05/24/21 2225)   furosemide 120 mg (06/05/21 1734)     LOS: 18 days    Time spent: 40 minutes    Irine Seal, MD [facility]   To contact the attending provider between 7A-7P or the covering provider during after hours 7P-7A, please log into the web site [email or web address] and access using universal [place] password for that web site. If you do not have the password, please call the hospital operator.  06/05/2021, 6:05 PM

## 2021-06-06 LAB — RENAL FUNCTION PANEL
Albumin: 3.1 g/dL — ABNORMAL LOW (ref 3.5–5.0)
Anion gap: 10 (ref 5–15)
BUN: 58 mg/dL — ABNORMAL HIGH (ref 6–20)
CO2: 29 mmol/L (ref 22–32)
Calcium: 9.4 mg/dL (ref 8.9–10.3)
Chloride: 97 mmol/L — ABNORMAL LOW (ref 98–111)
Creatinine, Ser: 3.01 mg/dL — ABNORMAL HIGH (ref 0.61–1.24)
GFR, Estimated: 26 mL/min — ABNORMAL LOW (ref >60)
Glucose, Bld: 151 mg/dL — ABNORMAL HIGH (ref 70–99)
Phosphorus: 5.5 mg/dL — ABNORMAL HIGH (ref 2.5–4.6)
Potassium: 3.9 mmol/L (ref 3.5–5.1)
Sodium: 136 mmol/L (ref 135–145)

## 2021-06-06 LAB — CBC
HCT: 31.2 % — ABNORMAL LOW (ref 39.0–52.0)
Hemoglobin: 9.6 g/dL — ABNORMAL LOW (ref 13.0–17.0)
MCH: 25.9 pg — ABNORMAL LOW (ref 26.0–34.0)
MCHC: 30.8 g/dL (ref 30.0–36.0)
MCV: 84.1 fL (ref 80.0–100.0)
Platelets: 210 10*3/uL (ref 150–400)
RBC: 3.71 MIL/uL — ABNORMAL LOW (ref 4.22–5.81)
RDW: 18.7 % — ABNORMAL HIGH (ref 11.5–15.5)
WBC: 7.7 10*3/uL (ref 4.0–10.5)
nRBC: 0 % (ref 0.0–0.2)

## 2021-06-06 LAB — GLUCOSE, CAPILLARY
Glucose-Capillary: 141 mg/dL — ABNORMAL HIGH (ref 70–99)
Glucose-Capillary: 212 mg/dL — ABNORMAL HIGH (ref 70–99)
Glucose-Capillary: 135 mg/dL — ABNORMAL HIGH (ref 70–99)
Glucose-Capillary: 105 mg/dL — ABNORMAL HIGH (ref 70–99)
Glucose-Capillary: 127 mg/dL — ABNORMAL HIGH (ref 70–99)

## 2021-06-06 LAB — MAGNESIUM: Magnesium: 2 mg/dL (ref 1.7–2.4)

## 2021-06-06 NOTE — Evaluation (Addendum)
Occupational Therapy Evaluation ?Patient Details ?Name: Micheal Levy ?MRN: [id number] ?DOB: 05/08/1980 ?Today's Date: 06/06/2021 ? ? ?History of Present Illness 41 yo admitted to [facility] 2/11 with respiratory failure and acute on chronic CHF. CRRT 2/17-2/22. Transfer to [facility] 2/22. Cardiac cath 2/24. PMhx: DM, HLD, HTN, CKD, CHF, substance abuse  ? ?Clinical Impression ?  ?Pt admitted with the above diagnoses. At baseline, pt is independent with ADLs. Pt is currently mod I to independent with ADLs. Tolerated OOB activity well, reports he has been walking in the halls during admission. No further acute OT needs indicated, will sign-off.    ? ?Recommendations for follow up therapy are one component of a multi-disciplinary discharge planning process, led by the attending physician.  Recommendations may be updated based on patient status, additional functional criteria and insurance authorization.  ? ?Follow Up Recommendations ? Other (comment) (may benefit from OP Cardiac Rehab program)  ?  ?Assistance Recommended at Discharge PRN  ?Patient can return home with the following   ? ?  ?Functional Status Assessment ? Patient has not had a recent decline in their functional status .  ?Equipment Recommendations ? None recommended by OT  ?  ?Recommendations for Other Services   ? ? ?  ?Precautions / Restrictions Precautions ?Precautions: None ?Restrictions ?Weight Bearing Restrictions: No  ? ?  ? ?Mobility Bed Mobility ?Overal bed mobility: Independent ?  ?  ?  ?  ?  ?  ?  ?  ? ?Transfers ?Overall transfer level: Modified independent ?  ?  ?  ?  ?  ?  ?  ?  ?General transfer comment: extra time ?  ? ?  ?Balance Overall balance assessment: Mild deficits observed, not formally tested ?  ?  ?  ?  ?  ?  ?  ?  ?  ?  ?  ?  ?  ?  ?  ?  ?  ?  ?   ? ?ADL either performed or assessed with clinical judgement  ? ?ADL Overall ADL's : Modified independent ?  ?  ?  ?  ?  ?  ?  ?  ?  ?  ?  ?  ?  ?  ?  ?  ?  ?  ?  ?General ADL Comments: extra time   ? ? ? ?Vision   ?   ?   ?Perception   ?  ?Praxis   ?  ? ?Pertinent Vitals/Pain Pain Assessment ?Pain Assessment: No/denies pain  ? ? ? ?Hand Dominance Right ?  ?Extremity/Trunk Assessment Upper Extremity Assessment ?Upper Extremity Assessment: Overall WFL for tasks assessed ?  ?Lower Extremity Assessment ?Lower Extremity Assessment: Overall WFL for tasks assessed ?RLE Deficits / Details: bil LE edema ?  ?Cervical / Trunk Assessment ?Cervical / Trunk Assessment: Normal ?  ?Communication Communication ?Communication: No difficulties ?  ?Cognition Arousal/Alertness: Awake/alert ?Behavior During Therapy: [facility] for tasks assessed/performed ?Overall Cognitive Status: Within Functional Limits for tasks assessed ?  ?  ?  ?  ?  ?  ?  ?  ?  ?  ?  ?  ?  ?  ?  ?  ?  ?  ?  ?General Comments  BP 149/87 at end of session after OOB activity. ? ?  ?Exercises   ?  ?Shoulder Instructions    ? ? ?Home Living Family/patient expects to be discharged to:: Private residence ?Living Arrangements: Spouse/significant other ?Available Help at Discharge: Family;Available 24 hours/day ?Type of Home: House ?Home  Access: Stairs to enter ?Entrance Stairs-Number of Steps: 10 ?Entrance Stairs-Rails: Right ?Home Layout: One level ?Alternate Level Stairs-Number of Steps: 14 ?  ?Bathroom Shower/Tub: Tub/shower unit ?  ?Bathroom Toilet: Standard ?  ?  ?Home Equipment: None ?  ?Additional Comments: works in Aeronautical engineer ?  ? ?  ?Prior Functioning/Environment Prior Level of Function : Independent/Modified Independent ?  ?  ?  ?  ?  ?  ?  ?  ?  ? ?  ?  ?OT Problem List: Cardiopulmonary status limiting activity;Decreased knowledge of precautions;Decreased activity tolerance ?  ?   ?OT Treatment/Interventions:    ?  ?OT Goals(Current goals can be found in the care plan section) Acute Rehab OT Goals ?Patient Stated Goal: home  ?OT Frequency:   ?  ? ?Co-evaluation   ?  ?  ?  ?  ? ?  ?AM-PAC OT "6 Clicks" Daily Activity     ?Outcome Measure Help from another  person eating meals?: None ?Help from another person taking care of personal grooming?: None ?Help from another person toileting, which includes using toliet, bedpan, or urinal?: None ?Help from another person bathing (including washing, rinsing, drying)?: None ?Help from another person to put on and taking off regular upper body clothing?: None ?Help from another person to put on and taking off regular lower body clothing?: None ?6 Click Score: 24 ?  ?End of Session   ? ?Activity Tolerance: Patient tolerated treatment well ?Patient left: with call bell/phone within reach;in bed;Other (comment) (sitting EOB) ? ?OT Visit Diagnosis: Other (comment) (decreased activity tolerance)  ?              ?Time: [id number] ?OT Time Calculation (min): 13 min ?Charges:  OT General Charges ?$OT Visit: 1 Visit ?OT Evaluation ?$OT Eval Low Complexity: 1 Low ? ?Raynald Kemp, OT ?Acute Rehabilitation Services ?Office: [phone] ? ? ?Raynald Kemp H ?06/06/2021, 10:47 AM ?

## 2021-06-06 NOTE — Evaluation (Signed)
Physical Therapy Evaluation/ Discharge ?Patient Details ?Name: Micheal Levy ?MRN: [id number] ?DOB: 1980-10-12 ?Today's Date: 06/06/2021 ? ?History of Present Illness ? 41 yo admitted to [facility] 2/11 with respiratory failure and acute on chronic CHF. CRRT 2/17-2/22. Transfer to [facility] 2/22. Cardiac cath 2/24. PMhx: DM, HLD, HTN, CKD, CHF, substance abuse  ?Clinical Impression ? 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 Pt educated for walking program at D/C as well as acute ambulation daily. Pt with bil LE edema with education for elevation and HEp. Pt at baseline functional level without acute need and able to verbalize understanding of education, will sign off with pt aware and agreeable.    ? ?Sats 90-96% on RA ?HR 78-104 ?   ? ?Recommendations for follow up therapy are one component of a multi-disciplinary discharge planning process, led by the attending physician.  Recommendations may be updated based on patient status, additional functional criteria and insurance authorization. ? ?Follow Up Recommendations No PT follow up ? ?  ?Assistance Recommended at Discharge None  ?Patient can return home with the following ?   ? ?  ?Equipment Recommendations None recommended by PT  ?Recommendations for Other Services ?    ?  ?Functional Status Assessment Patient has not had a recent decline in their functional status  ? ?  ?Precautions / Restrictions Precautions ?Precautions: None ?Restrictions ?Weight Bearing Restrictions: No  ? ?  ? ?Mobility ? Bed Mobility ?  ?  ?  ?  ?  ?  ?  ?General bed mobility comments: EOB on arrival ?  ? ?Transfers ?Overall transfer level: Modified independent ?  ?  ?  ?  ?  ?  ?  ?  ?  ?  ? ?Ambulation/Gait ?Ambulation/Gait assistance: Independent ?Gait Distance (Feet): 600 Feet ?Assistive device: None ?Gait Pattern/deviations: WFL(Within Functional Limits) ?  ?Gait velocity interpretation: >2.62 ft/sec, indicative of  community ambulatory ?  ?General Gait Details: pt with steady gait without AD and sats 90-94% on RA, HR 104 ? ?Stairs ?Stairs: Yes ?Stairs assistance: Modified independent (Device/Increase time) ?Stair Management: Alternating pattern, Forwards, One rail Left ?Number of Stairs: 9 ?  ? ?Wheelchair Mobility ?  ? ?Modified Rankin (Stroke Patients Only) ?  ? ?  ? ?Balance Overall balance assessment: No apparent balance deficits (not formally assessed) ?  ?  ?  ?  ?  ?  ?  ?  ?  ?  ?  ?  ?  ?  ?  ?  ?  ?  ?   ? ? ? ?Pertinent Vitals/Pain Pain Assessment ?Pain Assessment: No/denies pain  ? ? ?Home Living Family/patient expects to be discharged to:: Private residence ?Living Arrangements: Spouse/significant other ?Available Help at Discharge: Family;Available 24 hours/day ?Type of Home: House ?Home Access: Stairs to enter ?Entrance Stairs-Rails: Right ?Entrance Stairs-Number of Steps: 10 ?Alternate Level Stairs-Number of Steps: 14 ?Home Layout: One level ?Home Equipment: None ?Additional Comments: works in Biomedical scientist  ?  ?Prior Function Prior Level of Function : Independent/Modified Independent ?  ?  ?  ?  ?  ?  ?  ?  ?  ? ? ?Hand Dominance  ? Dominant Hand: Right ? ?  ?Extremity/Trunk Assessment  ? Upper Extremity Assessment ?Upper Extremity Assessment: Overall WFL for tasks assessed ?  ? ?Lower Extremity Assessment ?Lower Extremity Assessment: Overall WFL for tasks assessed ?RLE Deficits / Details: bil LE edema ?  ? ?  Cervical / Trunk Assessment ?Cervical / Trunk Assessment: Normal  ?Communication  ? Communication: No difficulties  ?Cognition Arousal/Alertness: Awake/alert ?Behavior During Therapy: [facility] for tasks assessed/performed ?Overall Cognitive Status: Within Functional Limits for tasks assessed ?  ?  ?  ?  ?  ?  ?  ?  ?  ?  ?  ?  ?  ?  ?  ?  ?  ?  ?  ? ?  ?General Comments General comments (skin integrity, edema, etc.): BP 149/87 at end of session after OOB activity. ? ?  ?Exercises    ? ?Assessment/Plan  ?  ?PT  Assessment Patient does not need any further PT services  ?PT Problem List   ? ?   ?  ?PT Treatment Interventions     ? ?PT Goals (Current goals can be found in the Care Plan section)  ?Acute Rehab PT Goals ?PT Goal Formulation: All assessment and education complete, DC therapy ? ?  ?Frequency   ?  ? ? ?Co-evaluation   ?  ?  ?  ?  ? ? ?  ?AM-PAC PT "6 Clicks" Mobility  ?Outcome Measure Help needed turning from your back to your side while in a flat bed without using bedrails?: None ?Help needed moving from lying on your back to sitting on the side of a flat bed without using bedrails?: None ?Help needed moving to and from a bed to a chair (including a wheelchair)?: None ?Help needed standing up from a chair using your arms (e.g., wheelchair or bedside chair)?: None ?Help needed to walk in hospital room?: None ?Help needed climbing 3-5 steps with a railing? : None ?6 Click Score: 24 ? ?  ?End of Session   ?Activity Tolerance: Patient tolerated treatment well ?Patient left: in chair;with call bell/phone within reach ?Nurse Communication: Mobility status ?PT Visit Diagnosis: Other abnormalities of gait and mobility (R26.89) ?  ? ?Time: [id number] ?PT Time Calculation (min) (ACUTE ONLY): 16 min ? ? ?Charges:   PT Evaluation ?$PT Eval Low Complexity: 1 Low ?  ?  ?   ? ? ?Jermya Dowding P, PT ?Acute Rehabilitation Services ?Pager: [phone] ?Office: [phone] ? ? ?Ermal Haberer B Addalynn Kumari ?06/06/2021, 10:48 AM ? ?

## 2021-06-06 NOTE — Progress Notes (Signed)
? ?Progress Note ? ?Patient Name: Micheal Levy ?Date of Encounter: 06/06/2021 ? ?Primary Cardiologist: Berniece Salines, DO ? ?Subjective  ? ?Net negative 1.0L yesterday, -30L on admission.  BP 146/95 this morning.  Creatinine stable (3.0>2.8>3.0) this morning.  Denies chest pain or dyspnea ? ?Inpatient Medications  ?  ?Scheduled Meds: ? amLODipine  10 mg Oral Daily  ? B-complex with vitamin C  1 tablet Oral Daily  ? carvedilol  25 mg Oral BID WC  ? cloNIDine  0.3 mg Oral TID  ? feeding supplement  237 mL Oral BID BM  ? heparin  5,000 Units Subcutaneous Q8H  ? hydrALAZINE  75 mg Oral Q8H  ? insulin aspart  0-20 Units Subcutaneous TID WC  ? insulin aspart  0-5 Units Subcutaneous QHS  ? insulin aspart  4 Units Subcutaneous TID WC  ? isosorbide mononitrate  120 mg Oral Daily  ? mouth rinse  15 mL Mouth Rinse BID  ? pantoprazole  40 mg Oral QHS  ? sodium chloride flush  3 mL Intravenous Q12H  ? ?Continuous Infusions: ? sodium chloride Stopped (05/24/21 2225)  ? furosemide 120 mg (06/06/21 0629)  ? ?PRN Meds: ?sodium chloride, acetaminophen, docusate sodium, labetalol, ondansetron (ZOFRAN) IV, polyethylene glycol, sodium chloride flush  ? ?Vital Signs  ?  ?Vitals:  ? 06/05/21 1606 06/05/21 2011 06/06/21 0335 06/06/21 [id number]  ?BP: (!) 137/95 138/89 (!) 149/90 (!) 146/95  ?Pulse: 74 62 67   ?Resp: 20 20    ?Temp: 97.8 ?F (36.6 ?C) 97.6 ?F (36.4 ?C) 97.7 ?F (36.5 ?C)   ?TempSrc: Oral Oral Oral   ?SpO2: 95% 95% (!) 2%   ?Weight:   103 kg   ?Height:      ? ? ?Intake/Output Summary (Last 24 hours) at 06/06/2021 0739 ?Last data filed at 06/05/2021 2109 ?Gross per 24 hour  ?Intake 1123 ml  ?Output 2150 ml  ?Net -1027 ml  ? ? ?Filed Weights  ? 06/03/21 0426 06/05/21 0550 06/06/21 0335  ?Weight: 105.6 kg 102.9 kg 103 kg  ? ? ?Telemetry  ?  ?Sinus rhythm.  Personally reviewed. ? ?ECG  ?  ?No ECG reviewed. ? ?Physical Exam  ? ?GEN: No acute distress.   ?Neck: +JVD. ?Cardiac: RRR, no murmur ?Respiratory: Decreased breath sounds at the bases.   ?GI: Soft, nontender ?MS: 1+ lower leg edema; No deformity. ?Neuro:  Nonfocal. ?Psych: Alert and oriented x 3. Normal affect. ? ?Labs  ?  ?Chemistry ?Recent Labs  ?Lab 06/03/21 ?0311 06/04/21 ?0155 06/05/21 ?[id number] 06/06/21 ?[id number]  ?NA 138 136 137 136  ?K 4.2 4.0 3.5 3.9  ?CL 102 98 99 97*  ?CO2 25 27 28 29   ?GLUCOSE 129* 139* 149* 151*  ?BUN 61* 60* 58* 58*  ?CREATININE 3.03* 3.02* 2.79* 3.01*  ?CALCIUM 9.3 9.7 9.4 9.4  ?PROT 6.4* 7.1 6.7  --   ?ALBUMIN 2.8* 3.3* 3.0* 3.1*  ?AST 15 22 19   --   ?ALT 30 30 25   --   ?ALKPHOS 68 71 62  --   ?BILITOT 0.7 0.6 0.5  --   ?GFRNONAA 25* 26* 28* 26*  ?ANIONGAP 11 11 10 10   ? ?  ? ?Hematology ?Recent Labs  ?Lab 06/04/21 ?0155 06/05/21 ?0218 06/06/21 ?0313  ?WBC 8.2 7.9 7.7  ?RBC 3.95* 3.69* 3.71*  ?HGB 10.3* 9.6* 9.6*  ?HCT 32.9* 30.7* 31.2*  ?MCV 83.3 83.2 84.1  ?MCH 26.1 26.0 25.9*  ?MCHC 31.3 31.3 30.8  ?RDW 18.6* 18.7* 18.7*  ?PLT  221 212 210  ? ? ? ?Cardiac Enzymes ?Recent Labs  ?Lab 05/08/21 ?1634 05/08/21 ?1840 05/18/21 ?0008 05/18/21 ?0250 05/18/21 ?0606  ?TROPONINIHS 24* 23* 26* 48* 68*  ? ? ? ?Radiology  ?  ?No results found. ? ?Cardiac Studies  ? ?Echocardiogram 05/18/2021: ? 1. Left ventricular ejection fraction, by estimation, is 70 to 75%. The  ?left ventricle has hyperdynamic function. The left ventricle has no  ?regional wall motion abnormalities. There is mild concentric left  ?ventricular hypertrophy. Left ventricular  ?diastolic parameters are indeterminate. Elevated left ventricular  ?end-diastolic pressure. The average left ventricular global longitudinal  ?strain is -24.8 %. The global longitudinal strain is normal.  ? 2. Right ventricular systolic function is normal. The right ventricular  ?size is mildly enlarged. There is moderately elevated pulmonary artery  ?systolic pressure. The estimated right ventricular systolic pressure is  ?123456 mmHg.  ? 3. The mitral valve is normal in structure. No evidence of mitral valve  ?regurgitation. No evidence of mitral  stenosis.  ? 4. The aortic valve is normal in structure. Aortic valve regurgitation is  ?not visualized. No aortic stenosis is present.  ? 5. Aortic dilatation noted. There is mild dilatation of the ascending  ?aorta, measuring 38 mm.  ? 6. The inferior vena cava is dilated in size with <50% respiratory  ?variability, suggesting right atrial pressure of 15 mmHg.  ? ?Assessment & Plan  ?  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  Concurrent question of AL amyloidosis based on elevated free light chains however SPEP with immunofixation normal.  Urine immunofixation pending. Echocardiographic findings are not overly suggestive of amyloidosis including global longitudinal strain pattern. Case was discussed with Dr. Haroldine Laws last week, may be an issue of restrictive cardiomyopathy with hypertensive heart disease and poorly controlled blood pressure over time.  Significantly elevated pressures on RHC 2/24: RA 22, RV 78/8, PA 83/37, PCWP 38, PA sat 62%, CI 3.6.   ?-Markedly elevated pressures on RHC 2/24. Volume status improved but continues to have 1+ LE edema and elevated JVD. Renal function stable, would continue IV lasix 120 mg BID ? ?Hypertensive emergency complicating presentation with severe hypertension at baseline.  BP remains elevated.  Currently on carvedilol 25 mg twice daily, clonidine 0.3 mg 3 times daily, IV Lasix 120 mg twice daily, hydralazine 75 mg 3 times daily, imdur 120 mg daily ? ?Acute kidney injury on CKD stage IIIb.  Followed by nephrology and required CRRT initially for volume overload, has not required hemodialysis at this point.  Creatinine currently stable at 3.0 ? ?Acute hypoxic respiratory failure, initially managed for pneumonia and pulmonary edema.  Resolved, on room air. ? ?Substance abuse history, in remission (previously cocaine and marijuana). ? ? ?Signed, ?Donato Heinz, MD  ?06/06/2021, 7:39 AM    ?

## 2021-06-06 NOTE — Progress Notes (Signed)
PROGRESS NOTE    Micheal Levy  ZOX:[id number] DOB: 1980-05-23 DOA: 05/18/2021 PCP: [name], NP    Chief Complaint  Patient presents with   Shortness of Breath    Brief Narrative:  The patient is a 41 year old obese African-American male with a past medical history significant for but limited to diabetes mellitus type 2, hyperlipidemia, hypertension, history of CKD stage IIIa with baseline creatinine around 3, chronic diastolic CHF with last EF of 55 to 60% and grade 3 diastolic dysfunction noted on echo back in October 2022, history of substance abuse as well as other comorbidities who presented to the [name] ED on 05/18/2021 for respiratory distress.  He was recently hospitalized from 200-4 2023 for acute on chronic diastolic CHF acute on chronic kidney disease and at that time presented with dyspnea, PND, worsening pedal edema and elevated BNP.  His renal ultrasound at that time was within normal limits and he was treated with IV Lasix and transition to p.o. on discharge home.  He returns back on 05/18/2021 with worsening respiratory distress and initially was treated as an asthma exacerbation with epinephrine by EMS found to be hypertensive with systolic blood pressure in the 180s to 811B and diastolic blood pressure 147.  ABG was done and showed respiratory acidosis with a mixed picture with saturations in the 80s.  Chest x-ray was concerning for pulmonary edema and he had significant crackles on examination.  He was admitted for acute hypoxic respiratory failure due to bilateral infiltrates with arts and pulmonary hypertension.  Patient was started on nitro drip and transition to nicardipine for systolic blood pressure 829.  At that time he was given Lasix 40 mg x 1.  PCCM was consulted for admission in the setting of respiratory distress and hypertensive emergency and he was admitted on 05/18/2021 and then on 05/20/2021 he was placed on BiPAP 18/6 with 60% FiO2.  05/21/2021 he had  left IJ central venous catheter placed.  He remained on Cardene and 05/22/2021 his Cardene drip started being down titrated and his p.o. antihypertensives were uptitrated.  His respiratory status at that time was stable on 15 L high flow nasal cannula and he still had improvement but with marginal urine output with aggressive diuresis.  On 05/23/2021 he had continued improvement in his O2 needs but remained hypertensive off Cardene and nephrology was consulted for HD/CRRT to optimize for right heart cath.  His right IJ catheter was changed out to a trialysis catheter and on 05/24/2021 he initiated CRRT and had 3.2 L removed and his O2 saturations we will wean down to 10 L and he tolerated this well.  On 05/26/2021 he subjectively felt better but his chest x-ray did not change and had elevated blood pressure despite multiple agents and remained on CRRT.  On tube 22,023 he was weaned on 2 L of nasal cannula and he was net 5.9 L and felt better but subsequently he was transferred to [facility] for right heart catheterization and initiation of intermittent hemodialysis.  Cardiology evaluated the patient and planning for a heart catheterization on Friday.  Patient's volume will has improved and his chest x-ray is better and he is down 15 to 20 kg with CRRT.  Plan is for right heart cath scheduled for Friday and patient has initiated 24-hour urine protein collection.  The recommendation is for an abdominal fat pad/bone marrow biopsy for amyloid and the PCCM PA reviewed the case with hematology and they felt that the light chains  could be some degree of poor clearance of renal failure and recommending further biopsy to rule out other disease processes but given this a myeloma panel will be sent at 24-hour urine for completeness.  May discuss with IR about a fat pad biopsy/bone marrow biopsy but will await his right heart cath first.  Patient's renal function is mildly elevated today and may be stabilizing.  Nephrology  recommends no dialysis at this time and they recommend continuing the patient on a renal diet for now.  Plan is for cardiac catheterization when patient's volume status is improved.  Cardiology is increasing his Norvasc and they are planning on outpatient work-up for questionable cardiac amyloidosis.  Patient underwent cardiac cath on 05/31/2021.  Renal function continued to climb slowly so will need to monitor carefully but is now improving. Cardiology trying a dose of IV Lasix and continuing monitor of renal Fxn. He is on IV Lasix 80 mg BID. Nephrology D/C Temp HD Cath.  Patient is being diuresed and Cr is slightly bumped to 3.0. Cardiology recommending continuing IV Lasix today and possibly converting to po Demadex.  He remains significantly volume overloaded and had extremely high pulmonary blood pressures they are recommending continue IV Lasix and increasing this to 120 mg p.o. twice daily    Assessment & Plan:  Principal Problem:   Acute respiratory failure (HCC) Active Problems:   AKI (acute kidney injury) ([place])   Hypertensive urgency   Uncontrolled type 2 diabetes mellitus with hyperglycemia (HCC)   Acute on chronic diastolic CHF (congestive heart failure) (HCC)   Restrictive cardiomyopathy (HCC)   Acute kidney injury superimposed on CKD ([name])   Pulmonary hypertension (HCC)   Normocytic anemia   Obesity (BMI 30-39.9)   Hyperkalemia    Assessment and Plan: * Acute respiratory failure ([name]) -PCCM Signed off  -Patient had Nodular Infiltrates on CXR/CT and was not consistent with pulmonary edema -Initially concerned with elevated PCT and because of lack of response to diuretics that could be infectious vs. Inflammatory process. -They initially felt that hydralazine induced pneumonitis was possible but he had a negative antihistone antibody. -His HIV was negative -Per pulmonary he had a similar nodular infiltrates dating back to 01/2021 -He continues to have residual infiltrates on  plain films despite significant volume reduction and clinical improvement. -Autoimmune panel has been largely negative except elevated kappa lambda light chains -His ESR was 56 and his ANA was negative and RF was negative as well as ANCA being negative. -UDS was negative and RV panel was negative his protein/creatinine ratio was 1.39 and trended down to 0.40 with a negative histone antibody negative -His kappa light chain was 109.4 and his lambda was 40.6 with a ratio being 2.69 -Patient oxygenation is improved currently on room air with sats of 100%. -Per pulmonary continue weaning oxygen saturations greater than 90%; Now off of O2 -Follow intermittent chest x-ray repeat chest this AM showed "A right jugular catheter remains in place, terminating over the mid SVC. The cardiac silhouette remains enlarged. Interstitial and airspace opacities in the right greater than left lungs have slightly improved. No sizable pleural effusion or pneumothorax is identified." -We will consider repeat CT imaging once he is dry to evaluate the parenchyma nodular infiltrates and he will get a right heart cath on Friday to evaluate his pulmonary pressures; Will likely repeat noncontrast imaging once he is drier per PCCM recc's -Fluid removal with CRRT has been completed now he is at [facility] for intermittent hemodialysis and will  defer dialysis to nephrology and they feel that there is no current indication for dialysis at this time and now getting IV Lasix managed by Cardiology -Continue to monitor respiratory status carefully and if necessary will place him back on nebs. -Patient with urine output of 2.1 L over the past 24 hours and is -35.907 L during this hospitalization. -Continue IV Lasix, flutter valve, incentive spirometry. -Follow BP.   Hyperkalemia -K+ improved from 5.3 -> 4.4 -> 4.2 -> 4.0>> 3.5>> 3.9 -Patient on IV Lasix. -Repeat labs in the a.m. May need oral potassium supplementation as on high-dose IV  Lasix.  Obesity (BMI 86-57.8) -Complicates overall prognosis and care -Estimated body mass index is 36.03 kg/m as calculated from the following:   Height as of this encounter: $RemoveBeforeD'5\' 8"'[id number]$  (1.727 m).   Weight as of this encounter: 107.5 kg.  -Weight Loss and Dietary Counseling given   Normocytic anemia - Patient's hemoglobin/hematocrit is relatively stable and and trend has gone from 9.7/30.4 -> 9.1/29.9 -> 9.4/30.3 -> 9.4/30.2 -> 10.3/32.9>> 9.6/30.7>> 9.6/31.2 -Likely anemia of chronic kidney disease -Anemia panel was checked and iron level was 33, U IBC is 261, TIBC 294, saturation ratios of 11%, ferritin level 111, folate level 11.1, vitamin B12 626 -Nephrology recommended transfusing as needed hemoglobin for hemoglobin < 7 -Continue to monitor for signs and symptoms bleeding; currently no overt bleeding noted -Repeat CBC in a.m.    Pulmonary hypertension (HCC) -Patient status post right heart cath. -His vasculitis panel was negative but his kappa and lambda light chains are slightly elevated -he is off of CRRT and has been transferred to [facility] for intermittent hemodialysis -Undergoing 24-hour urine protein collection and this is complete; urine immunofixation ordered and pending. -Once he is euvolemic pulmonary recommended repeating CT scan to evaluate his parenchyma and nodular infiltrates; will consider this after his cardiac catheterization and once he has been diuresed -Fluid was removed via CRRT and will be now controlled with intermittent hemodialysis but renal recommends no dialysis at this time and his Temp Cath has been removed; Continue to Monitor Renal Fxn carefully. -Was on Lasix 80 mg IV every 12 hours and this has been increased to 120 mg IV every 12 hours per cardiology recommendations.  Acute kidney injury superimposed on CKD ([place]) -See AKI  Restrictive cardiomyopathy ([place]) - There is clinical suspicion for hypertensive heart disease but needs to rule out amyloid  and he has a hematology referral.  He is getting a myeloma panel sent out and a 24-hour urine protein.  -SPEP had immunofixation that was normal  -His kappa and lambda light chains were elevated and his ratio was elevated.  -We will consider obtaining a fat pad biopsy and and/or bone marrow biopsy and will need to discuss with hematology further -echo was unchanged but showed an more vigorous elevated LV function. -He has elevated Lambda Light Chains with Concern for Amyloidosis -Cardiology evaluating and he will need outpatient hematology referral and will discuss with them about next steps about possibly getting a bone marrow biopsy and abdominal fat pad biopsy while in house but will have further cardiac amyloidosis work-up in the outpatient setting -S/p Right heart cath 05/31/2021 -His antihistone testing was negative so his hydralazine has been resumed -Cardiology increased IV Lasix 80 mg to IV 120 mg BID and this was done 06/03/21: -Cardiology recommending continuation of current dose of IV Lasix.     -Cardiology recommending continuing carvedilol 25 mg p.o. twice daily as well as Imdur 120 mg  p.o. daily, Norvasc, clonidine, hydralazine. -Per cardiology if blood pressure not at goal may consider addition of minoxidil. -Per cardiology no ACE/ARB/ARNI or mineralocorticoid receptor antagonist given his renal function -Cardiology recommended outpatient sleep study -Further plans per cardiology clinically suspect that he does not have amyloid and they feel that his restrictive cardiomyopathy is secondary to hypertensive heart disease and poorly controlled blood pressure over time but they feel it is not unreasonable to consider hematology consultation regarding his elevated light chains in the outpatient setting; In the Interim they are ordering Urine Immunofixation and currently pending.    Acute on chronic diastolic CHF (congestive heart failure) ([place]) -See Restrictive  Cardiomyopathy -Cardiology consulted and recommending continuing Coreg 25 mg p.o. twice daily, Imdur 120 g p.o. daily they have increased her Norvasc to 10 mg p.o. daily.  They are recommending no ACEi/ARB/ARNI or MRA given his renal function worsening -Status post right heart catheterization 05/31/2021. - Cath showed " Ao sat 90%, PA sat 62%, PA pressure 83/37, mean PA pressure 54 mm Hg; mean PCWP 38 mm Hg; prominent V waves on wedge tracing; CO 7.9 L/min; CI 3.6. Severe systemic hypertension. Hemodynamic findings consistent with severe pulmonary hypertension. He still appears quite volume overloaded. rominent V wave on wedge tracing may represent restrictive physiology. Continue medical therapy including volume removal with dialysis."  -Further Care per Cards and IV Lasix increased to 120 mg twice daily.  -Patient with urine output of 2.150 L over the past 24 hours. -Patient is - 35.907 L during this hospitalization. -Strict I's and O's and Daily Weights; -Per cardiology   Uncontrolled type 2 diabetes mellitus with hyperglycemia ([place]) -Hemoglobin A1c 6.6 (05/21/2021 ). -CBG 141 this morning.   -Patient was placed on a resistant sign scale insulin before meals and at bedtime and also has meal coverage with 4 units 3 times daily. -Continue monitor CBGs per protocol and adjust insulin as necessary.  Hypertensive urgency -Appreciate cardiology evaluation -Off of the nitro drip and Cardene drips -Cardiology has initiated carvedilol 25 mg p.o. twice daily, Imdur 120 mg p.o. daily, clonidine 0.3 mg p.o. 3 times daily, Norvasc 10 mg daily, hydralazine 75mg  3 times daily. -Cardiology recommending resuming minoxidil if blood pressure still remain uncontrolled and not at goal. -Blood pressure 131/81 this afternoon -Patient had been removed off of the hydralazine but since his antihistone testing was negative they discussed that it was safe for him to take the hydralazine but he refused it on  05/28/2021 -Patient get cardiology input and a right heart cath as well as some cardiac imaging at some point -Currently cardiology recommends no ACE inhibitor/ARB/ARNI or MRI given his renal function -Continue monitor blood pressures per protocol.   AKI (acute kidney injury) ([place]) -Has a history of CKD stage IIIb with a baseline creatinine 1.8-3 -His creatinine on admission was 2.4 and peaked at 3.9 so CRRT was started on 05/23/2021 -Nephrology felt that he had a cardiorenal cause of his AKI and had some proteinuria which was quantified at 400 mg to 4 1300 mg p.o. daily approximately and they felt that this is in the nonnephrotic proteinuria. -He was started on CRRT 05/23/2018 for massive volume overload and was weaned off oxygen his chest x-ray had improved. -He was transferred to [facility] for intermittent hemodialysis and his CRRT was discontinued on 05/28/2021; Nephrology feels no indication for hemodialysis at this point and feel that he may have some renal recovery and may be plateauing -Patient currently on Lasix 120 mg IV twice  daily. -Urine output of 2.150 L over the past 24 hours -Continue strict I's and O's and daily weights -Patient's BUNs/creatinine went from 33/1.62 -> 34/1.76 -> 38/2.32 and is now elevated slightly at 51/2.60 -> 58/3.12 -> 58/2.88 -> 63/3.02 -> 61/3.03 -> 60/3.02>>> 58/2.79>>> 58/3.01 -Renal function seems to be stabilizing -Further care per nephrology and will avoid further nephrotoxic medication, contrast dyes if possible, hypotension renally dose medications -Repeat CMP in a.m. -Nephrology has now Signed off the Case and will have the patient follow up with [facility] in the outpatient setting          DVT prophylaxis: Heparin Code Status: Full Family Communication: Updated patient.  No family at bedside. Disposition: TBD  Status is: Inpatient Remains inpatient appropriate because: Severity of illness           Consultants:  Nephrology: Dr. Candiss Norse  05/20/2021 Cardiology: Dr. Gasper Sells 05/27/2021 PCCM  Procedures:  CT chest 05/21/2021 Renal ultrasound 05/20/2021 2D echo 05/18/2021 Right heart cardiac catheterization 05/31/2021 Renal artery duplex 05/21/2021   Antimicrobials:  IV cefepime 05/18/2021>>>> 05/24/2021  Anti-infectives (From admission, onward)    Start     Dose/Rate Route Frequency Ordered Stop   05/18/21 1030  ceFEPIme (MAXIPIME) 2 g in sodium chloride 0.9 % 100 mL IVPB        2 g 200 mL/hr over 30 Minutes Intravenous Every 12 hours 05/18/21 0937 05/24/21 2300         Subjective: Sitting up in recliner.  Overall feeling better.  Shortness of breath improving.  No chest pain.  No abdominal pain.   Objective: Vitals:   06/06/21 1045 06/06/21 1227 06/06/21 1335 06/06/21 1447  BP:  133/84 135/86 131/81  Pulse: (!) 104 70 66   Resp:  20 18   Temp:  97.9 F (36.6 C)    TempSrc:  Oral    SpO2: 90% 96% 100%   Weight:      Height:        Intake/Output Summary (Last 24 hours) at 06/06/2021 1631 Last data filed at 06/06/2021 1455 Gross per 24 hour  Intake 717 ml  Output 2060 ml  Net -1343 ml   Filed Weights   06/03/21 0426 06/05/21 0550 06/06/21 0335  Weight: 105.6 kg 102.9 kg 103 kg    Examination:  General exam: NAD. Respiratory system: Bibasilar crackles.  No wheezing.  Fair air movement.  Bibasilar crackles.  No wheezing.  Fair air movement.  Speaking in full sentences. Cardiovascular system: RRR no murmurs rubs or gallops.  Positive JVD.  1-2+ bilateral lower extremity edema.  Gastrointestinal system: Abdomen is soft, nontender, nondistended, positive bowel sounds.  No rebound.  No guarding.  Central nervous system: Alert and oriented. No focal neurological deficits. Extremities: Symmetric 5 x 5 power. Skin: No rashes, lesions or ulcers Psychiatry: Judgement and insight appear normal. Mood & affect appropriate.     Data Reviewed:   CBC: Recent Labs  Lab 06/01/21 0424 06/02/21 0235  06/03/21 0311 06/04/21 0155 06/05/21 0218 06/06/21 0313  WBC 7.9 7.4 8.1 8.2 7.9 7.7  NEUTROABS 5.0 4.6 4.9 4.5 3.8  --   HGB 9.1* 9.4* 9.4* 10.3* 9.6* 9.6*  HCT 29.9* 30.3* 30.2* 32.9* 30.7* 31.2*  MCV 84.5 82.8 83.4 83.3 83.2 84.1  PLT 184 187 180 221 212 628    Basic Metabolic Panel: Recent Labs  Lab 06/02/21 0235 06/03/21 0311 06/04/21 0155 06/05/21 0218 06/06/21 0313  NA 137 138 136 137 136  K 4.4 4.2 4.0 3.5 3.9  CL 102 102 98 99 97*  CO2 $Re'26 25 27 28 29  'uaZ$ GLUCOSE 167* 129* 139* 149* 151*  BUN 63* 61* 60* 58* 58*  CREATININE 3.02* 3.03* 3.02* 2.79* 3.01*  CALCIUM 9.4 9.3 9.7 9.4 9.4  MG 2.1 1.8 2.2 1.7 2.0  PHOS 5.7* 5.6* 5.4* 5.4* 5.5*    GFR: Estimated Creatinine Clearance: 37.2 mL/min (A) (by C-G formula based on SCr of 3.01 mg/dL (H)).  Liver Function Tests: Recent Labs  Lab 06/01/21 0424 06/02/21 0235 06/03/21 0311 06/04/21 0155 06/05/21 0218 06/06/21 0313  AST $Re'19 17 15 22 19  'jVf$ --   ALT 39 35 $Rem'30 30 25  'UyOZ$ --   ALKPHOS 58 65 68 71 62  --   BILITOT 0.3 0.4 0.7 0.6 0.5  --   PROT 6.2* 6.5 6.4* 7.1 6.7  --   ALBUMIN 2.7* 2.9* 2.8* 3.3* 3.0* 3.1*    CBG: Recent Labs  Lab 06/05/21 1717 06/05/21 2007 06/06/21 0748 06/06/21 1128 06/06/21 1607  GLUCAP 109* 143* 141* 212* 135*     No results found for this or any previous visit (from the past 240 hour(s)).       Radiology Studies: No results found.      Scheduled Meds:  amLODipine  10 mg Oral Daily   B-complex with vitamin C  1 tablet Oral Daily   carvedilol  25 mg Oral BID WC   cloNIDine  0.3 mg Oral TID   feeding supplement  237 mL Oral BID BM   heparin  5,000 Units Subcutaneous Q8H   hydrALAZINE  75 mg Oral Q8H   insulin aspart  0-20 Units Subcutaneous TID WC   insulin aspart  0-5 Units Subcutaneous QHS   insulin aspart  4 Units Subcutaneous TID WC   isosorbide mononitrate  120 mg Oral Daily   mouth rinse  15 mL Mouth Rinse BID   pantoprazole  40 mg Oral QHS   sodium chloride  flush  3 mL Intravenous Q12H   Continuous Infusions:  sodium chloride Stopped (05/24/21 2225)   furosemide 120 mg (06/06/21 0629)     LOS: 19 days    Time spent: 35 minutes    Irine Seal, MD [facility]   To contact the attending provider between 7A-7P or the covering provider during after hours 7P-7A, please log into the web site [email or web address] and access using universal [place] password for that web site. If you do not have the password, please call the hospital operator.  06/06/2021, 4:31 PM

## 2021-06-07 LAB — RENAL FUNCTION PANEL
Albumin: 3.2 g/dL — ABNORMAL LOW (ref 3.5–5.0)
Anion gap: 11 (ref 5–15)
BUN: 58 mg/dL — ABNORMAL HIGH (ref 6–20)
CO2: 26 mmol/L (ref 22–32)
Calcium: 9.3 mg/dL (ref 8.9–10.3)
Chloride: 95 mmol/L — ABNORMAL LOW (ref 98–111)
Creatinine, Ser: 3.11 mg/dL — ABNORMAL HIGH (ref 0.61–1.24)
GFR, Estimated: 25 mL/min — ABNORMAL LOW (ref >60)
Glucose, Bld: 229 mg/dL — ABNORMAL HIGH (ref 70–99)
Phosphorus: 5.1 mg/dL — ABNORMAL HIGH (ref 2.5–4.6)
Potassium: 3.7 mmol/L (ref 3.5–5.1)
Sodium: 132 mmol/L — ABNORMAL LOW (ref 135–145)

## 2021-06-07 LAB — CBC
HCT: 32.1 % — ABNORMAL LOW (ref 39.0–52.0)
Hemoglobin: 10 g/dL — ABNORMAL LOW (ref 13.0–17.0)
MCH: 26.4 pg (ref 26.0–34.0)
MCHC: 31.2 g/dL (ref 30.0–36.0)
MCV: 84.7 fL (ref 80.0–100.0)
Platelets: 223 10*3/uL (ref 150–400)
RBC: 3.79 MIL/uL — ABNORMAL LOW (ref 4.22–5.81)
RDW: 18.5 % — ABNORMAL HIGH (ref 11.5–15.5)
WBC: 7.5 10*3/uL (ref 4.0–10.5)
nRBC: 0 % (ref 0.0–0.2)

## 2021-06-07 LAB — GLUCOSE, CAPILLARY
Glucose-Capillary: 133 mg/dL — ABNORMAL HIGH (ref 70–99)
Glucose-Capillary: 145 mg/dL — ABNORMAL HIGH (ref 70–99)
Glucose-Capillary: 160 mg/dL — ABNORMAL HIGH (ref 70–99)
Glucose-Capillary: 142 mg/dL — ABNORMAL HIGH (ref 70–99)

## 2021-06-07 LAB — MAGNESIUM: Magnesium: 1.9 mg/dL (ref 1.7–2.4)

## 2021-06-07 MED ORDER — TORSEMIDE 20 MG PO TABS: 80.0000 mg | ORAL_TABLET | Freq: Every day | ORAL | Status: DC

## 2021-06-07 NOTE — TOC Progression Note (Signed)
Transition of Care (TOC) - Progression Note  ? ? ?Patient Details  ?Name: Micheal Levy ?MRN: [id number] ?Date of Birth: 1980-07-31 ? ?Transition of Care (TOC) CM/SW Contact  ?Gala Lewandowsky, RN ?Phone Number: ?06/07/2021, 2:06 PM ? ?Clinical Narrative:  IV Lasix had been changed to oral diuresis. Case Manager will continue to follow for additional transition of care needs.  ? ?Expected Discharge Plan: Home/Self Care ?Barriers to Discharge: Continued Medical Work up ? ?Expected Discharge Plan and Services ?Expected Discharge Plan: Home/Self Care ?  ?Discharge Planning Services: CM Consult ?Post Acute Care Choice:  (Home) ?Living arrangements for the past 2 months: Single Family Home ?                ?  ?DME Agency: NA ?  ?  ?  ?HH Arranged: NA ?  ? ?Readmission Risk Interventions ?Readmission Risk Prevention Plan 05/31/2021 05/22/2021  ?Transportation Screening Complete Complete  ?Medication Review Oceanographer) Complete Complete  ?PCP or Specialist appointment within 3-5 days of discharge Complete Complete  ?HRI or Home Care Consult Complete Complete  ?SW Recovery Care/Counseling Consult Complete Complete  ?Palliative Care Screening Not Applicable Not Applicable  ?Skilled Nursing Facility Not Applicable Not Applicable  ?Some recent data might be hidden  ? ? ?

## 2021-06-07 NOTE — Progress Notes (Signed)
PROGRESS NOTE    Micheal Levy  ZOX:[id number] DOB: 1980-05-23 DOA: 05/18/2021 PCP: [name], NP    Chief Complaint  Patient presents with   Shortness of Breath    Brief Narrative:  The patient is a 41 year old obese African-American male with a past medical history significant for but limited to diabetes mellitus type 2, hyperlipidemia, hypertension, history of CKD stage IIIa with baseline creatinine around 3, chronic diastolic CHF with last EF of 55 to 60% and grade 3 diastolic dysfunction noted on echo back in October 2022, history of substance abuse as well as other comorbidities who presented to the [name] ED on 05/18/2021 for respiratory distress.  He was recently hospitalized from 200-4 2023 for acute on chronic diastolic CHF acute on chronic kidney disease and at that time presented with dyspnea, PND, worsening pedal edema and elevated BNP.  His renal ultrasound at that time was within normal limits and he was treated with IV Lasix and transition to p.o. on discharge home.  He returns back on 05/18/2021 with worsening respiratory distress and initially was treated as an asthma exacerbation with epinephrine by EMS found to be hypertensive with systolic blood pressure in the 180s to 811B and diastolic blood pressure 147.  ABG was done and showed respiratory acidosis with a mixed picture with saturations in the 80s.  Chest x-ray was concerning for pulmonary edema and he had significant crackles on examination.  He was admitted for acute hypoxic respiratory failure due to bilateral infiltrates with arts and pulmonary hypertension.  Patient was started on nitro drip and transition to nicardipine for systolic blood pressure 829.  At that time he was given Lasix 40 mg x 1.  PCCM was consulted for admission in the setting of respiratory distress and hypertensive emergency and he was admitted on 05/18/2021 and then on 05/20/2021 he was placed on BiPAP 18/6 with 60% FiO2.  05/21/2021 he had  left IJ central venous catheter placed.  He remained on Cardene and 05/22/2021 his Cardene drip started being down titrated and his p.o. antihypertensives were uptitrated.  His respiratory status at that time was stable on 15 L high flow nasal cannula and he still had improvement but with marginal urine output with aggressive diuresis.  On 05/23/2021 he had continued improvement in his O2 needs but remained hypertensive off Cardene and nephrology was consulted for HD/CRRT to optimize for right heart cath.  His right IJ catheter was changed out to a trialysis catheter and on 05/24/2021 he initiated CRRT and had 3.2 L removed and his O2 saturations we will wean down to 10 L and he tolerated this well.  On 05/26/2021 he subjectively felt better but his chest x-ray did not change and had elevated blood pressure despite multiple agents and remained on CRRT.  On tube 22,023 he was weaned on 2 L of nasal cannula and he was net 5.9 L and felt better but subsequently he was transferred to [facility] for right heart catheterization and initiation of intermittent hemodialysis.  Cardiology evaluated the patient and planning for a heart catheterization on Friday.  Patient's volume will has improved and his chest x-ray is better and he is down 15 to 20 kg with CRRT.  Plan is for right heart cath scheduled for Friday and patient has initiated 24-hour urine protein collection.  The recommendation is for an abdominal fat pad/bone marrow biopsy for amyloid and the PCCM PA reviewed the case with hematology and they felt that the light chains  could be some degree of poor clearance of renal failure and recommending further biopsy to rule out other disease processes but given this a myeloma panel will be sent at 24-hour urine for completeness.  May discuss with IR about a fat pad biopsy/bone marrow biopsy but will await his right heart cath first.  Patient's renal function is mildly elevated today and may be stabilizing.  Nephrology  recommends no dialysis at this time and they recommend continuing the patient on a renal diet for now.  Plan is for cardiac catheterization when patient's volume status is improved.  Cardiology is increasing his Norvasc and they are planning on outpatient work-up for questionable cardiac amyloidosis.  Patient underwent cardiac cath on 05/31/2021.  Renal function continued to climb slowly so will need to monitor carefully but is now improving. Cardiology trying a dose of IV Lasix and continuing monitor of renal Fxn. He is on IV Lasix 80 mg BID. Nephrology D/C Temp HD Cath.  Patient is being diuresed and Cr is slightly bumped to 3.0. Cardiology recommending continuing IV Lasix today and possibly converting to po Demadex.  He remains significantly volume overloaded and had extremely high pulmonary blood pressures they are recommending continue IV Lasix and increasing this to 120 mg p.o. twice daily    Assessment & Plan:  Principal Problem:   Acute respiratory failure (HCC) Active Problems:   AKI (acute kidney injury) ([place])   Hypertensive urgency   Uncontrolled type 2 diabetes mellitus with hyperglycemia (HCC)   Acute on chronic diastolic CHF (congestive heart failure) (HCC)   Restrictive cardiomyopathy (HCC)   Acute kidney injury superimposed on CKD ([place])   Pulmonary hypertension (HCC)   Normocytic anemia   Obesity (BMI 30-39.9)   Hyperkalemia    Assessment and Plan: * Acute respiratory failure ([place]) -PCCM Signed off  -Patient had Nodular Infiltrates on CXR/CT and was not consistent with pulmonary edema -Initially concerned with elevated PCT and because of lack of response to diuretics that could be infectious vs. Inflammatory process. -They initially felt that hydralazine induced pneumonitis was possible but he had a negative antihistone antibody. -His HIV was negative -Per pulmonary he had a similar nodular infiltrates dating back to 01/2021 -He continues to have residual infiltrates on  plain films despite significant volume reduction and clinical improvement. -Autoimmune panel has been largely negative except elevated kappa lambda light chains -His ESR was 56 and his ANA was negative and RF was negative as well as ANCA being negative. -UDS was negative and RV panel was negative his protein/creatinine ratio was 1.39 and trended down to 0.40 with a negative histone antibody negative -His kappa light chain was 109.4 and his lambda was 40.6 with a ratio being 2.69 -Patient oxygenation is improved currently on room air with sats of 98%. -Follow intermittent chest x-ray repeat chest  showed "A right jugular catheter remains in place, terminating over the mid SVC. The cardiac silhouette remains enlarged. Interstitial and airspace opacities in the right greater than left lungs have slightly improved. No sizable pleural effusion or pneumothorax is identified." -We will consider repeat CT imaging once he is dry to evaluate the parenchyma nodular infiltrates and he will get a right heart cath on Friday to evaluate his pulmonary pressures; Will likely repeat noncontrast imaging once he is drier per PCCM recc's -Fluid removal with CRRT has been completed now he is at [facility] for intermittent hemodialysis and will defer dialysis to nephrology and they feel that there is no current indication for  dialysis at this time and now getting IV Lasix managed by Cardiology and being transition to oral Lasix -Continue to monitor respiratory status carefully and if necessary will place him back on nebs. -Patient with urine output of 3.120 L over the past 24 hours and is - 34.722 L during this hospitalization. -IV Lasix being transitioned to oral Lasix per cardiology.  -Continue flutter valve, incentive spirometry.  -Follow BP.   Hyperkalemia -K+ improved from 5.3 -> 4.4 -> 4.2 -> 4.0>> 3.5>> 3.9 -Patient on IV Lasix. -Repeat labs in the a.m. May need oral potassium supplementation.  Obesity (BMI  11-15.5) -Complicates overall prognosis and care -Estimated body mass index is 36.03 kg/m as calculated from the following:   Height as of this encounter: _0  (1.727 m).   Weight as of this encounter: 107.5 kg.  -Weight Loss and Dietary Counseling given   Normocytic anemia - Patient's hemoglobin/hematocrit is relatively stable and and trend has gone from 9.7/30.4 -> 9.1/29.9 -> 9.4/30.3 -> 9.4/30.2 -> 10.3/32.9>> 9.6/30.7>> 9.6/31.2>> 10.0/32.1 -Likely anemia of chronic kidney disease -Anemia panel was checked and iron level was 33, U IBC is 261, TIBC 294, saturation ratios of 11%, ferritin level 111, folate level 11.1, vitamin B12 626 -Nephrology recommended transfusing as needed hemoglobin for hemoglobin < 7 -Continue to monitor for signs and symptoms bleeding; currently no overt bleeding noted -Repeat CBC in a.m.    Pulmonary hypertension (HCC) -Patient status post right heart cath. -His vasculitis panel was negative but his kappa and lambda light chains are slightly elevated -he is off of CRRT and has been transferred to [facility] for intermittent hemodialysis -Underwent 24-hour urine protein collection and this is complete; urine immunofixation ordered and pending. -Once he is euvolemic pulmonary recommended repeating CT scan to evaluate his parenchyma and nodular infiltrates; will consider this after his cardiac catheterization and once he has been diuresed -Fluid was removed via CRRT and will be now controlled with intermittent hemodialysis but renal recommends no dialysis at this time and his Temp Cath has been removed; Continue to Monitor Renal Fxn carefully. -Was on Lasix 80 mg IV every 12 hours and this has been increased to 120 mg IV every 12 hours per cardiology recommendations. -Patient being transitioned to oral torsemide 80 mg daily per cardiology.  Acute kidney injury superimposed on CKD ([place]) -See AKI  Restrictive cardiomyopathy ([place]) - There is clinical  suspicion for hypertensive heart disease but needs to rule out amyloid and he has a hematology referral.  He is getting a myeloma panel sent out and a 24-hour urine protein.  -SPEP had immunofixation that was normal  -His kappa and lambda light chains were elevated and his ratio was elevated.  -We will consider obtaining a fat pad biopsy and and/or bone marrow biopsy and will need to discuss with hematology further -echo was unchanged but showed an more vigorous elevated LV function. -He has elevated Lambda Light Chains with Concern for Amyloidosis -Cardiology evaluating and he will need outpatient hematology referral and will discuss with them about next steps about possibly getting a bone marrow biopsy and abdominal fat pad biopsy while in house but will have further cardiac amyloidosis work-up in the outpatient setting -S/p Right heart cath 05/31/2021 -His antihistone testing was negative so his hydralazine has been resumed -Cardiology increased IV Lasix 80 mg to IV 120 mg BID and this was done 06/03/21: -Cardiology recommending continuation of current dose of IV Lasix.     -Cardiology recommending continuing carvedilol 25 mg  p.o. twice daily as well as Imdur 120 mg p.o. daily, Norvasc, clonidine, hydralazine. -Per cardiology if blood pressure not at goal may consider addition of minoxidil. -Per cardiology no ACE/ARB/ARNI or mineralocorticoid receptor antagonist given his renal function -Cardiology recommended outpatient sleep study -Further plans per cardiology clinically suspect that he does not have amyloid and they feel that his restrictive cardiomyopathy is secondary to hypertensive heart disease and poorly controlled blood pressure over time but they feel it is not unreasonable to consider hematology consultation regarding his elevated light chains in the outpatient setting; In the Interim they are ordering Urine Immunofixation and currently pending.    Acute on chronic diastolic CHF  (congestive heart failure) ([place]) -See Restrictive Cardiomyopathy -Cardiology consulted and recommending continuing Coreg 25 mg p.o. twice daily, Imdur 120 g p.o. daily they have increased her Norvasc to 10 mg p.o. daily.  They are recommending no ACEi/ARB/ARNI or MRA given his renal function worsening -Status post right heart catheterization 05/31/2021. - Cath showed " Ao sat 90%, PA sat 62%, PA pressure 83/37, mean PA pressure 54 mm Hg; mean PCWP 38 mm Hg; prominent V waves on wedge tracing; CO 7.9 L/min; CI 3.6. Severe systemic hypertension. Hemodynamic findings consistent with severe pulmonary hypertension. He still appears quite volume overloaded. rominent V wave on wedge tracing may represent restrictive physiology. Continue medical therapy including volume removal with dialysis."  -Further Care per Cards and IV Lasix increased to 120 mg twice daily.  Patient to be transition to oral torsemide per cardiology. -Patient with urine output of 3.120 L over the past 24 hours. -Patient is - 34.722 L during this hospitalization. -Strict I's and O's and Daily Weights; -Per cardiology   Uncontrolled type 2 diabetes mellitus with hyperglycemia ([place]) -Hemoglobin A1c 6.6 (05/21/2021 ). -CBG 133 this morning.   -Patient was placed on a resistant sign scale insulin before meals and at bedtime and also has meal coverage with 4 units 3 times daily. -Continue monitor CBGs per protocol and adjust insulin as necessary.  Hypertensive urgency -Appreciate cardiology evaluation -Off of the nitro drip and Cardene drips -Cardiology has initiated carvedilol 25 mg p.o. twice daily, Imdur 120 mg p.o. daily, clonidine 0.3 mg p.o. 3 times daily, Norvasc 10 mg daily, hydralazine 90m 3 times daily. -Cardiology recommending resuming minoxidil if blood pressure still remain uncontrolled and not at goal. -Blood pressure 139/89 this afternoon. -Patient had been removed off of the hydralazine but since his antihistone testing  was negative they discussed that it was safe for him to take the hydralazine but he refused it on 05/28/2021 -Patient get cardiology input and a right heart cath as well as some cardiac imaging at some point -Currently cardiology recommends no ACE inhibitor/ARB/ARNI or MRI given his renal function -Continue monitor blood pressures per protocol.   AKI (acute kidney injury) [place] -Has a history of CKD stage IIIb with a baseline creatinine 1.8-3 -His creatinine on admission was 2.4 and peaked at 3.9 so CRRT was started on 05/23/2021 -Nephrology felt that he had a cardiorenal cause of his AKI and had some proteinuria which was quantified at 400 mg to 4 1300 mg p.o. daily approximately and they felt that this is in the nonnephrotic proteinuria. -He was started on CRRT 05/23/2018 for massive volume overload and was weaned off oxygen his chest x-ray had improved. -[facility] and his CRRT was discontinued on 05/28/2021; Nephrology feels no indication for hemodialysis at this point and feel that  he may have some renal recovery and may be plateauing -Patient currently on Lasix 120 mg IV twice daily. -Urine output of 3.120 L over the past 24 hours -Continue strict I's and O's and daily weights -Patient's BUNs/creatinine went from 33/1.62 -> 34/1.76 -> 38/2.32 and is now elevated slightly at 51/2.60 -> 58/3.12 -> 58/2.88 -> 63/3.02 -> 61/3.03 -> 60/3.02>>> 58/2.79>>> 58/3.01>> 58/3.11 -Renal function seems to be stabilizing -Patient being transitioned to oral torsemide per cardiology. -Further care per nephrology and will avoid further nephrotoxic medication, contrast dyes if possible, hypotension renally dose medications -Repeat CMP in a.m. -Nephrology has now Signed off the Case and will have the patient follow up with [facility] in the outpatient setting          DVT prophylaxis: Heparin Code Status: Full Family Communication: Updated patient.  No family  at bedside. Disposition: TBD  Status is: Inpatient Remains inpatient appropriate because: Severity of illness           Consultants:  Nephrology: Dr. Candiss Norse 05/20/2021 Cardiology: Dr. Gasper Sells 05/27/2021 PCCM  Procedures:  CT chest 05/21/2021 Renal ultrasound 05/20/2021 2D echo 05/18/2021 Right heart cardiac catheterization 05/31/2021 Renal artery duplex 05/21/2021   Antimicrobials:  IV cefepime 05/18/2021>>>> 05/24/2021  Anti-infectives (From admission, onward)    Start     Dose/Rate Route Frequency Ordered Stop   05/18/21 1030  ceFEPIme (MAXIPIME) 2 g in sodium chloride 0.9 % 100 mL IVPB        2 g 200 mL/hr over 30 Minutes Intravenous Every 12 hours 05/18/21 0937 05/24/21 2300         Subjective: Sitting up in bed.  Overall feeling better.  Denies any significant shortness of breath.  Denies any chest pain.  No abdominal pain.  Tolerating current diet.  States good urine output.  Objective: Vitals:   06/07/21 0625 06/07/21 0841 06/07/21 1551 06/07/21 1642  BP: (!) 141/91 (!) 144/93 109/66 139/89  Pulse:    68  Resp:  _0 Temp:  97.9 F (36.6 C) 97.8 F (36.6 C)   TempSrc:  Oral Oral   SpO2:  98%    Weight:      Height:        Intake/Output Summary (Last 24 hours) at 06/07/2021 1923 Last data filed at 06/07/2021 1553 Gross per 24 hour  Intake 1328 ml  Output 2736 ml  Net -1408 ml   Filed Weights   06/05/21 0550 06/06/21 0335 06/07/21 0500  Weight: 102.9 kg 103 kg 100 kg    Examination:  General exam: NAD. Respiratory system: Decreased bibasilar crackles.  No wheezing.  Fair air movement.  Speaking in full sentences.  Cardiovascular system: Regular rate rhythm no murmurs rubs or gallops.  Positive JVD.  1+ bilateral lower extremity edema Gastrointestinal system: Abdomen is soft, nontender, nondistended, positive bowel sounds.  No rebound.  No guarding.  Central nervous system: Alert and oriented. No focal neurological deficits. Extremities:  Symmetric 5 x 5 power. Skin: No rashes, lesions or ulcers Psychiatry: Judgement and insight appear normal. Mood & affect appropriate.     Data Reviewed:   CBC: Recent Labs  Lab 06/01/21 0424 06/02/21 0235 06/03/21 0311 06/04/21 0155 06/05/21 0218 06/06/21 0313 06/07/21 0301  WBC 7.9 7.4 8.1 8.2 7.9 7.7 7.5  NEUTROABS 5.0 4.6 4.9 4.5 3.8  --   --   HGB 9.1* 9.4* 9.4* 10.3* 9.6* 9.6* 10.0*  HCT 29.9* 30.3* 30.2* 32.9* 30.7* 31.2* 32.1*  MCV 84.5 82.8 83.4 83.3 83.2  84.1 84.7  PLT 184 187 180 221 212 210 419    Basic Metabolic Panel: Recent Labs  Lab 06/03/21 0311 06/04/21 0155 06/05/21 0218 06/06/21 0313 06/07/21 0301  NA 138 136 137 136 132*  K 4.2 4.0 3.5 3.9 3.7  CL 102 98 99 97* 95*  CO2 _0 GLUCOSE 129* 139* 149* 151* 229*  BUN 61* 60* 58* 58* 58*  CREATININE 3.03* 3.02* 2.79* 3.01* 3.11*  CALCIUM 9.3 9.7 9.4 9.4 9.3  MG 1.8 2.2 1.7 2.0 1.9  PHOS 5.6* 5.4* 5.4* 5.5* 5.1*    GFR: Estimated Creatinine Clearance: 35.5 mL/min (A) (by C-G formula based on SCr of 3.11 mg/dL (H)).  Liver Function Tests: Recent Labs  Lab 06/01/21 0424 06/02/21 0235 06/03/21 0311 06/04/21 0155 06/05/21 0218 06/06/21 0313 06/07/21 0301  AST _1 --   --   ALT 39 35 _2 --   --   ALKPHOS 58 65 68 71 62  --   --   BILITOT 0.3 0.4 0.7 0.6 0.5  --   --   PROT 6.2* 6.5 6.4* 7.1 6.7  --   --   ALBUMIN 2.7* 2.9* 2.8* 3.3* 3.0* 3.1* 3.2*    CBG: Recent Labs  Lab 06/06/21 1806 06/06/21 2131 06/07/21 0836 06/07/21 1130 06/07/21 1632  GLUCAP 105* 127* 133* 145* 160*     No results found for this or any previous visit (from the past 240 hour(s)).       Radiology Studies: No results found.      Scheduled Meds:  amLODipine  10 mg Oral Daily   B-complex with vitamin C  1 tablet Oral Daily   carvedilol  25 mg Oral BID WC   cloNIDine  0.3 mg Oral TID   feeding supplement  237 mL Oral BID BM   heparin  5,000 Units Subcutaneous Q8H    hydrALAZINE  75 mg Oral Q8H   insulin aspart  0-20 Units Subcutaneous TID WC   insulin aspart  0-5 Units Subcutaneous QHS   insulin aspart  4 Units Subcutaneous TID WC   isosorbide mononitrate  120 mg Oral Daily   mouth rinse  15 mL Mouth Rinse BID   pantoprazole  40 mg Oral QHS   sodium chloride flush  3 mL Intravenous Q12H   [START ON 06/08/2021] torsemide  80 mg Oral Daily   Continuous Infusions:  sodium chloride Stopped (05/24/21 2225)     LOS: 20 days    Time spent: 35 minutes    Irine Seal, MD [facility]   To contact the attending provider between 7A-7P or the covering provider during after hours 7P-7A, please log into the web site [email or web address] and access using universal [place] password for that web site. If you do not have the password, please call the hospital operator.  06/07/2021, 7:23 PM

## 2021-06-07 NOTE — Progress Notes (Signed)
? ?Progress Note ? ?Patient Name: Micheal Levy ?Date of Encounter: 06/07/2021 ? ?Primary Cardiologist: Berniece Salines, DO ? ?Subjective  ? ?Net negative 2.6L yesterday, -33L on admission.  BP 141/91 this morning.  Creatinine stable (3.0>2.8>3.0>3.1) this morning.  Denies chest pain or dyspnea ? ?Inpatient Medications  ?  ?Scheduled Meds: ? amLODipine  10 mg Oral Daily  ? B-complex with vitamin C  1 tablet Oral Daily  ? carvedilol  25 mg Oral BID WC  ? cloNIDine  0.3 mg Oral TID  ? feeding supplement  237 mL Oral BID BM  ? heparin  5,000 Units Subcutaneous Q8H  ? hydrALAZINE  75 mg Oral Q8H  ? insulin aspart  0-20 Units Subcutaneous TID WC  ? insulin aspart  0-5 Units Subcutaneous QHS  ? insulin aspart  4 Units Subcutaneous TID WC  ? isosorbide mononitrate  120 mg Oral Daily  ? mouth rinse  15 mL Mouth Rinse BID  ? pantoprazole  40 mg Oral QHS  ? sodium chloride flush  3 mL Intravenous Q12H  ? ?Continuous Infusions: ? sodium chloride Stopped (05/24/21 2225)  ? ?PRN Meds: ?sodium chloride, acetaminophen, docusate sodium, labetalol, ondansetron (ZOFRAN) IV, polyethylene glycol, sodium chloride flush  ? ?Vital Signs  ?  ?Vitals:  ? 06/06/21 1805 06/06/21 2123 06/07/21 0500 06/07/21 [id number]  ?BP: 133/88 126/84  (!) 141/91  ?Pulse: 71 67    ?Resp:  20    ?Temp:  97.9 ?F (36.6 ?C)    ?TempSrc:  Oral    ?SpO2:  100%    ?Weight:   100 kg   ?Height:      ? ? ?Intake/Output Summary (Last 24 hours) at 06/07/2021 0819 ?Last data filed at 06/07/2021 [id number] ?Gross per 24 hour  ?Intake 477 ml  ?Output 2640 ml  ?Net -2163 ml  ? ? ?Filed Weights  ? 06/05/21 0550 06/06/21 0335 06/07/21 0500  ?Weight: 102.9 kg 103 kg 100 kg  ? ? ?Telemetry  ?  ?Sinus rhythm.  Personally reviewed. ? ?ECG  ?  ?No ECG reviewed. ? ?Physical Exam  ? ?GEN: No acute distress.   ?Neck: Mild JVD. ?Cardiac: RRR, no murmur ?Respiratory: Decreased breath sounds at the bases.  ?GI: Soft, nontender ?MS: trace lower leg edema ?Neuro:  Nonfocal. ?Psych: Alert and oriented x 3.  Normal affect. ? ?Labs  ?  ?Chemistry ?Recent Labs  ?Lab 06/03/21 ?0311 06/04/21 ?0155 06/05/21 ?[id number] 06/06/21 ?[id number] 06/07/21 ?0301  ?NA 138 136 137 136 132*  ?K 4.2 4.0 3.5 3.9 3.7  ?CL 102 98 99 97* 95*  ?CO2 25 27 28 29 26   ?GLUCOSE 129* 139* 149* 151* 229*  ?BUN 61* 60* 58* 58* 58*  ?CREATININE 3.03* 3.02* 2.79* 3.01* 3.11*  ?CALCIUM 9.3 9.7 9.4 9.4 9.3  ?PROT 6.4* 7.1 6.7  --   --   ?ALBUMIN 2.8* 3.3* 3.0* 3.1* 3.2*  ?AST 15 22 19   --   --   ?ALT 30 30 25   --   --   ?ALKPHOS 68 71 62  --   --   ?BILITOT 0.7 0.6 0.5  --   --   ?GFRNONAA 25* 26* 28* 26* 25*  ?ANIONGAP 11 11 10 10 11   ? ?  ? ?Hematology ?Recent Labs  ?Lab 06/05/21 ?0218 06/06/21 ?0313 06/07/21 ?0301  ?WBC 7.9 7.7 7.5  ?RBC 3.69* 3.71* 3.79*  ?HGB 9.6* 9.6* 10.0*  ?HCT 30.7* 31.2* 32.1*  ?MCV 83.2 84.1 84.7  ?MCH 26.0 25.9* 26.4  ?  MCHC 31.3 30.8 31.2  ?RDW 18.7* 18.7* 18.5*  ?PLT 212 210 223  ? ? ? ?Cardiac Enzymes ?Recent Labs  ?Lab 05/08/21 ?1634 05/08/21 ?1840 05/18/21 ?0008 05/18/21 ?0250 05/18/21 ?0606  ?TROPONINIHS 24* 23* 26* 48* 68*  ? ? ? ?Radiology  ?  ?No results found. ? ?Cardiac Studies  ? ?Echocardiogram 05/18/2021: ? 1. Left ventricular ejection fraction, by estimation, is 70 to 75%. The  ?left ventricle has hyperdynamic function. The left ventricle has no  ?regional wall motion abnormalities. There is mild concentric left  ?ventricular hypertrophy. Left ventricular  ?diastolic parameters are indeterminate. Elevated left ventricular  ?end-diastolic pressure. The average left ventricular global longitudinal  ?strain is -24.8 %. The global longitudinal strain is normal.  ? 2. Right ventricular systolic function is normal. The right ventricular  ?size is mildly enlarged. There is moderately elevated pulmonary artery  ?systolic pressure. The estimated right ventricular systolic pressure is  ?123456 mmHg.  ? 3. The mitral valve is normal in structure. No evidence of mitral valve  ?regurgitation. No evidence of mitral stenosis.  ? 4. The  aortic valve is normal in structure. Aortic valve regurgitation is  ?not visualized. No aortic stenosis is present.  ? 5. Aortic dilatation noted. There is mild dilatation of the ascending  ?aorta, measuring 38 mm.  ? 6. The inferior vena cava is dilated in size with <50% respiratory  ?variability, suggesting right atrial pressure of 15 mmHg.  ? ?Assessment & Plan  ?  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  Concurrent question of AL amyloidosis based on elevated free light chains however SPEP and UPEP were unremarkable.  Echocardiographic findings are not overly suggestive of amyloidosis including global longitudinal strain pattern. Case was discussed with Dr. Haroldine Laws last week, may be an issue of restrictive cardiomyopathy with hypertensive heart disease and poorly controlled blood pressure over time.  Significantly elevated pressures on RHC 2/24: RA 22, RV 78/8, PA 83/37, PCWP 38, PA sat 62%, CI 3.6.   ?-Markedly elevated pressures on RHC 2/24. Has diuresed well, -33 L on admission.  Volume status significantly improved.  Will transition to p.o. torsemide 80 mg daily today.   ? ?Hypertensive emergency complicating presentation with severe hypertension at baseline.  BP remains elevated.  Currently on carvedilol 25 mg twice daily, clonidine 0.3 mg 3 times daily, IV Lasix 120 mg twice daily, hydralazine 75 mg 3 times daily, imdur 120 mg daily ? ?Acute kidney injury on CKD stage IIIb.  Followed by nephrology and required CRRT initially for volume overload, has not required hemodialysis at this point.  Creatinine currently stable at 3.1 ? ?Acute hypoxic respiratory failure, initially managed for pneumonia and pulmonary edema.  Resolved, on room air. ? ?Substance abuse history, in remission (previously cocaine and marijuana). ? ? ?Signed, ?Donato Heinz, MD  ?06/07/2021, 8:19 AM    ?

## 2021-06-08 ENCOUNTER — Inpatient Hospital Stay (HOSPITAL_COMMUNITY): Payer: Medicaid Other

## 2021-06-08 DIAGNOSIS — I10 Essential (primary) hypertension: Secondary | ICD-10-CM

## 2021-06-08 LAB — GLUCOSE, CAPILLARY
Glucose-Capillary: 123 mg/dL — ABNORMAL HIGH (ref 70–99)
Glucose-Capillary: 148 mg/dL — ABNORMAL HIGH (ref 70–99)

## 2021-06-08 LAB — CBC
HCT: 31.9 % — ABNORMAL LOW (ref 39.0–52.0)
Hemoglobin: 10 g/dL — ABNORMAL LOW (ref 13.0–17.0)
MCH: 26.3 pg (ref 26.0–34.0)
MCHC: 31.3 g/dL (ref 30.0–36.0)
MCV: 83.9 fL (ref 80.0–100.0)
Platelets: 232 10*3/uL (ref 150–400)
RBC: 3.8 MIL/uL — ABNORMAL LOW (ref 4.22–5.81)
RDW: 18.3 % — ABNORMAL HIGH (ref 11.5–15.5)
WBC: 7.7 10*3/uL (ref 4.0–10.5)
nRBC: 0 % (ref 0.0–0.2)

## 2021-06-08 LAB — RENAL FUNCTION PANEL
Albumin: 3.2 g/dL — ABNORMAL LOW (ref 3.5–5.0)
Anion gap: 10 (ref 5–15)
BUN: 60 mg/dL — ABNORMAL HIGH (ref 6–20)
CO2: 29 mmol/L (ref 22–32)
Calcium: 9.5 mg/dL (ref 8.9–10.3)
Chloride: 98 mmol/L (ref 98–111)
Creatinine, Ser: 2.85 mg/dL — ABNORMAL HIGH (ref 0.61–1.24)
GFR, Estimated: 27 mL/min — ABNORMAL LOW (ref >60)
Glucose, Bld: 161 mg/dL — ABNORMAL HIGH (ref 70–99)
Phosphorus: 5.6 mg/dL — ABNORMAL HIGH (ref 2.5–4.6)
Potassium: 3.6 mmol/L (ref 3.5–5.1)
Sodium: 137 mmol/L (ref 135–145)

## 2021-06-08 LAB — MAGNESIUM: Magnesium: 2 mg/dL (ref 1.7–2.4)

## 2021-06-08 MED ORDER — ASPIRIN EC 81 MG PO TBEC: 81.0000 mg | DELAYED_RELEASE_TABLET | Freq: Every day | ORAL | 0 refills | Status: DC

## 2021-06-08 MED ORDER — PANTOPRAZOLE SODIUM 40 MG PO TBEC: 40.0000 mg | DELAYED_RELEASE_TABLET | Freq: Every day | ORAL | 1 refills | Status: DC

## 2021-06-08 MED ORDER — GLIPIZIDE ER 10 MG PO TB24: 10.0000 mg | ORAL_TABLET | Freq: Every day | ORAL | 1 refills | Status: DC

## 2021-06-08 MED ORDER — TORSEMIDE 40 MG PO TABS: 80.0000 mg | ORAL_TABLET | Freq: Every day | ORAL | 1 refills | Status: DC

## 2021-06-08 MED ORDER — AMLODIPINE BESYLATE 10 MG PO TABS: 10.0000 mg | ORAL_TABLET | Freq: Every day | ORAL | 1 refills | Status: DC

## 2021-06-08 MED ORDER — ISOSORBIDE MONONITRATE ER 120 MG PO TB24: 120.0000 mg | ORAL_TABLET | Freq: Every day | ORAL | 1 refills | Status: DC

## 2021-06-08 MED ORDER — CLONIDINE HCL 0.3 MG PO TABS
0.3000 mg | ORAL_TABLET | Freq: Three times a day (TID) | ORAL | 1 refills | Status: AC
Start: 2021-06-08 — End: ?

## 2021-06-08 MED ORDER — HYDRALAZINE HCL 25 MG PO TABS: 75.0000 mg | ORAL_TABLET | Freq: Three times a day (TID) | ORAL | 1 refills | Status: DC

## 2021-06-08 MED ORDER — CARVEDILOL 25 MG PO TABS: 25.0000 mg | ORAL_TABLET | Freq: Two times a day (BID) | ORAL | 1 refills | Status: DC

## 2021-06-08 MED ORDER — LANTUS SOLOSTAR 100 UNIT/ML ~~LOC~~ SOPN: 5.0000 [IU] | PEN_INJECTOR | Freq: Every day | SUBCUTANEOUS | 0 refills | Status: AC

## 2021-06-08 MED ORDER — ACETAMINOPHEN 325 MG PO TABS: 650.0000 mg | ORAL_TABLET | ORAL | Status: DC | PRN

## 2021-06-08 NOTE — Progress Notes (Addendum)
? ?Progress Note ? ?Patient Name: Micheal Levy ?Date of Encounter: 06/08/2021 ? ?Primary Cardiologist: Berniece Salines, DO ? ?Subjective  ? ?I/O yesterday 1568/2076, -508, wt generally trending down (feel yesterday's weight was inaccurate), peak weight 283, now 225 ? ?He is breathing better.  Lives with his wife.  He says he will be able to get his medications filled. ? ?No chest pain or palpitations. ? ?Inpatient Medications  ?  ?Scheduled Meds: ? amLODipine  10 mg Oral Daily  ? B-complex with vitamin C  1 tablet Oral Daily  ? carvedilol  25 mg Oral BID WC  ? cloNIDine  0.3 mg Oral TID  ? feeding supplement  237 mL Oral BID BM  ? heparin  5,000 Units Subcutaneous Q8H  ? hydrALAZINE  75 mg Oral Q8H  ? insulin aspart  0-20 Units Subcutaneous TID WC  ? insulin aspart  0-5 Units Subcutaneous QHS  ? insulin aspart  4 Units Subcutaneous TID WC  ? isosorbide mononitrate  120 mg Oral Daily  ? mouth rinse  15 mL Mouth Rinse BID  ? pantoprazole  40 mg Oral QHS  ? sodium chloride flush  3 mL Intravenous Q12H  ? torsemide  80 mg Oral Daily  ? ?Continuous Infusions: ? sodium chloride Stopped (05/24/21 2225)  ? ?PRN Meds: ?sodium chloride, acetaminophen, docusate sodium, labetalol, ondansetron (ZOFRAN) IV, polyethylene glycol, sodium chloride flush  ? ?Vital Signs  ?  ?Vitals:  ? 06/07/21 1642 06/07/21 2045 06/08/21 0420 06/08/21 0752  ?BP: 139/89 136/82 (!) 147/92 (!) 152/90  ?Pulse: 68 60 72   ?Resp: 20 20 20    ?Temp:  (!) 97.4 ?F (36.3 ?C) 97.9 ?F (36.6 ?C)   ?TempSrc:  Oral Oral   ?SpO2:  99% 95%   ?Weight:   102.2 kg   ?Height:      ? ? ?Intake/Output Summary (Last 24 hours) at 06/08/2021 0815 ?Last data filed at 06/08/2021 0753 ?Gross per 24 hour  ?Intake 1928 ml  ?Output 1426 ml  ?Net 502 ml  ? ?Filed Weights  ? 06/06/21 0335 06/07/21 0500 06/08/21 0420  ?Weight: 103 kg 100 kg 102.2 kg  ? ? ?Telemetry  ?  ?Sinus rhythm personally reviewed. ? ?ECG  ?  ?No ECG reviewed. ? ?Physical Exam  ? ?General: Well developed, well  nourished, male in no acute distress ?Head: Eyes PERRLA, Head normocephalic and atraumatic ?Lungs: Few rales bases bilaterally to auscultation. ?Heart: HRRR S1 S2, without rub or gallop. No murmur. 4/4 extremity pulses are 2+ & equal.  Minimal JVD. ?Abdomen: Bowel sounds are present, abdomen soft and non-tender without masses or  hernias noted. ?Msk: Normal strength and tone for age. ?Extremities: No clubbing, cyanosis or edema.    ?Skin:  No rashes or lesions noted. ?Neuro: Alert and oriented X 3. ?Psych:  Good affect, responds appropriately ? ?Labs  ?  ?Chemistry ?Recent Labs  ?Lab 06/03/21 ?0311 06/04/21 ?0155 06/05/21 ?0218 06/06/21 ?[id number] 06/07/21 ?0301 06/08/21 ?0230  ?NA 138 136 137 136 132* 137  ?K 4.2 4.0 3.5 3.9 3.7 3.6  ?CL 102 98 99 97* 95* 98  ?CO2 25 27 28 29 26 29   ?GLUCOSE 129* 139* 149* 151* 229* 161*  ?BUN 61* 60* 58* 58* 58* 60*  ?CREATININE 3.03* 3.02* 2.79* 3.01* 3.11* 2.85*  ?CALCIUM 9.3 9.7 9.4 9.4 9.3 9.5  ?PROT 6.4* 7.1 6.7  --   --   --   ?ALBUMIN 2.8* 3.3* 3.0* 3.1* 3.2* 3.2*  ?AST 15 22  19  --   --   --   ?ALT 30 30 25   --   --   --   ?ALKPHOS 68 71 62  --   --   --   ?BILITOT 0.7 0.6 0.5  --   --   --   ?GFRNONAA 25* 26* 28* 26* 25* 27*  ?ANIONGAP 11 11 10 10 11 10   ?  ? ?Hematology ?Recent Labs  ?Lab 06/06/21 ?0313 06/07/21 ?0301 06/08/21 ?0230  ?WBC 7.7 7.5 7.7  ?RBC 3.71* 3.79* 3.80*  ?HGB 9.6* 10.0* 10.0*  ?HCT 31.2* 32.1* 31.9*  ?MCV 84.1 84.7 83.9  ?MCH 25.9* 26.4 26.3  ?MCHC 30.8 31.2 31.3  ?RDW 18.7* 18.5* 18.3*  ?PLT 210 223 232  ? ? ?Cardiac Enzymes ?Recent Labs  ?Lab 05/18/21 ?0008 05/18/21 ?0250 05/18/21 ?0606  ?TROPONINIHS 26* 48* 68*  ? ? ?Radiology  ?  ?No results found. ? ?Cardiac Studies  ? ?Echocardiogram 05/18/2021: ? 1. Left ventricular ejection fraction, by estimation, is 70 to 75%. The  ?left ventricle has hyperdynamic function. The left ventricle has no  ?regional wall motion abnormalities. There is mild concentric left  ?ventricular hypertrophy. Left ventricular   ?diastolic parameters are indeterminate. Elevated left ventricular  ?end-diastolic pressure. The average left ventricular global longitudinal  ?strain is -24.8 %. The global longitudinal strain is normal.  ? 2. Right ventricular systolic function is normal. The right ventricular  ?size is mildly enlarged. There is moderately elevated pulmonary artery  ?systolic pressure. The estimated right ventricular systolic pressure is  ?123456 mmHg.  ? 3. The mitral valve is normal in structure. No evidence of mitral valve  ?regurgitation. No evidence of mitral stenosis.  ? 4. The aortic valve is normal in structure. Aortic valve regurgitation is  ?not visualized. No aortic stenosis is present.  ? 5. Aortic dilatation noted. There is mild dilatation of the ascending  ?aorta, measuring 38 mm.  ? 6. The inferior vena cava is dilated in size with <50% respiratory  ?variability, suggesting right atrial pressure of 15 mmHg.  ? ?Assessment & Plan  ?  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  Concurrent question of AL amyloidosis based on elevated free light chains however SPEP and UPEP were unremarkable.   ? ?Echocardiographic findings are not overly suggestive of amyloidosis including global longitudinal strain pattern. Case was discussed with Dr. Haroldine Laws last week, may be an issue of restrictive cardiomyopathy with hypertensive heart disease and poorly controlled blood pressure over time.  Significantly elevated pressures on RHC 2/24: RA 22, RV 78/8, PA 83/37, PCWP 38, PA sat 62%, CI 3.6.   ?-Markedly elevated pressures on RHC 2/24. Has diuresed well, -29 L since admission.  Volume status significantly improved. Now off Lasix, to start p.o. torsemide 80 mg daily today.   ? ?Hypertensive emergency complicating presentation with severe hypertension at baseline.  BP remains elevated.  Currently on carvedilol 25 mg twice  daily, clonidine 0.3 mg 3 times daily, torsemide 80 mg qd, hydralazine 75 mg 3 times daily, imdur 120 mg daily. No ACE/ARB due to decreased renal function ? ?Acute kidney injury on CKD stage IIIb.  Followed by nephrology and required CRRT initially for volume overload, has not required hemodialysis at this point.  Creatinine currently stable at  2.85 ? ?Acute hypoxic respiratory failure, initially managed for pneumonia and pulmonary edema.  Resolved, on room air. ? ?Substance abuse  history, in remission (previously cocaine and marijuana). ? ?[facility] will sign off.   ?Medication Recommendations: Torsemide 80 mg daily, amlodipine 10 mg daily, clonidine 0.3 mg 3 times daily, carvedilol 25 mg twice daily, hydralazine 75 mg TID, imdur 120 mg daily ?Other recommendations (labs, testing, etc): BMET within 1 week ?Follow up as an outpatient: Appointment with Dr. Harriet Masson scheduled for 3/20 ? ? ?Signed, ?Rosaria Ferries, PA-C  ?06/08/2021, 8:15 AM    ? ?Patient seen and examined.  Agree with above documentation.  On exam, patient is alert and oriented, regular rate and rhythm, no murmurs, lungs CTAB, no LE edema, +JVD.  Net -500 cc yesterday on IV Lasix 2020 mg x 1.  Net -33 L on admission.  Creatinine improved today (3.11 > 2.85).  Volume status significantly improved, transition to torsemide 80 mg daily today.  We will sign off at this time, scheduled follow-up with Dr. Harriet Masson 3/20.  Recommend checking BMET within 1 week. ? ?Donato Heinz, MD ? ? ?

## 2021-06-08 NOTE — Discharge Summary (Signed)
Physician Discharge Summary  Micheal Levy:[id number] DOB: February 22, 1981 DOA: 05/18/2021  PCP: [name], NP  Admit date: 05/18/2021 Discharge date: 06/08/2021  Time spent: 60 minutes  Recommendations for Outpatient Follow-up:  Follow-up with Dr. Harriet Masson, cardiology on 06/24/2021 at 9 AM. Follow-up with Dr. Thurmond Butts [facility] in 2 weeks. Follow-up with Dr. Chase Caller, pulmonary in 2 to 3 weeks. Follow-up with [name], NP in 1 week.  On follow-up patient will need a basic metabolic profile done to follow-up on electrolytes and renal function.  Patient's blood pressure need to be reassessed on follow-up.  Patient's diabetes also need to be reassessed on follow-up.   Discharge Diagnoses:  Principal Problem:   Acute respiratory failure ([place]) Active Problems:   AKI (acute kidney injury) ([place])   Hypertensive urgency   Uncontrolled type 2 diabetes mellitus with hyperglycemia (HCC)   Acute on chronic diastolic CHF (congestive heart failure) (HCC)   Restrictive cardiomyopathy (HCC)   Acute kidney injury superimposed on CKD ([place])   Pulmonary hypertension (HCC)   Normocytic anemia   Obesity (BMI 30-39.9)   Hyperkalemia   Discharge Condition: Stable and improved  Diet recommendation: Heart healthy  Filed Weights   06/06/21 0335 06/07/21 0500 06/08/21 0420  Weight: 103 kg 100 kg 102.2 kg    History of present illness:  HPI per Dr. Gardiner Fanti Micheal Levy is a 41 year old morbidly obese male with chronic diastolic heart failure ejection fraction 55-60% and grade 3 diastolic dysfunction on transthoracic echo October 2022, history of previous CVA not otherwise specified, CKD stage IIIa [baseline creatinine running mid twos in 2022] hypertension, hyperlipidemia, type 2 diabetes on Lantus and history of substance abuse not otherwise specified [no urine toxicology seen in the medical chart].  He was just hospitalized approximately a week ago between 05/08/2021  through 05/11/2021 with acute on chronic diastolic heart failure and acute on chronic kidney injury [creatinine in the threes] in the setting of essential hypertension.  Renal ultrasound was fine.  Presenting symptoms were dyspnea paroxysmal nocturnal dyspnea worsening pedal edema asymptomatic pulmonary edema and elevated BNP.  He was treated with Lasix twice daily 40 mg.  At the time of discharge he was able to lie flat and able to walk around without shortness of breath and at discharge still had 1+ pedal edema which is chronic.  He again presented 05/18/2021 with sudden onset of worsening respiratory distress.  Treated as asthma exacerbation with epinephrine by EMS but was found in the ER to be very hypertensive with systolic blood pressure 812/751 and at times systolic 700.  ABG with respiratory acidosis mixed picture with saturation in the 80s.  Chest x-ray looking went significant crackles.  Started on nitro drip and subsequently nicardipine drip to get to systolic goal 174.  Lasix 40 mg x 1 given.  Course showed some improvement in the ER and critical care medicine called for admission.  It appears that his edema might of gotten worse  No history of fever or infectious symptoms.     Hospital Course:   Assessment and Plan: * Acute respiratory failure ([place]) -PCCM initially admitted the patient and as patient stabilized transferred to hospitalist team and PCCM subsequently signed off  -Patient had Nodular Infiltrates on CXR/CT and was not consistent with pulmonary edema -Initially concerned with elevated PCT and because of lack of response to diuretics that could be infectious vs. Inflammatory process. -They initially felt that hydralazine induced pneumonitis was possible but he had a negative antihistone  antibody. -His HIV was negative -Per pulmonary he had a similar nodular infiltrates dating back to 01/2021 -He continues to have residual infiltrates on plain films despite significant volume  reduction and clinical improvement. -Autoimmune panel has been largely negative except elevated kappa lambda light chains -His ESR was 56 and his ANA was negative and RF was negative as well as ANCA being negative. -UDS was negative and RV panel was negative his protein/creatinine ratio was 1.39 and trended down to 0.40 with a negative histone antibody negative -His kappa light chain was 109.4 and his lambda was 40.6 with a ratio being 2.69 -Patient oxygenation is improved and was satting 95% on room air by day of discharge  -Follow intermittent chest x-ray repeat chest  showed "A right jugular catheter remains in place, terminating over the mid SVC. The cardiac silhouette remains enlarged. Interstitial and airspace opacities in the right greater than left lungs have slightly improved. No sizable pleural effusion or pneumothorax is identified." -Once patient was adequately diuresed repeat CT chest without contrast was done on day of discharge per PCCM recommendations which showed significant interval improvement bilateral airspace disease with mild residual airspace disease in the right upper lobe, right middle lobe and to a lesser extent bilateral lower lobes.  Interval resolution of right pleural effusion.  Trace left pleural effusion.  -Fluid removal with CRRT has been completed now he is at [facility] for intermittent hemodialysis and will defer dialysis to nephrology and they feel that there is no current indication for dialysis at this time and was placed on IV Lasix which was managed by cardiology and subsequently transition to oral torsemide 80 mg daily on day of discharge per cardiology recommendations.  -Patient with good diuresis during the hospitalization and was -29.423 L by day of discharge. -Patient also maintained on flutter valve and incentive spirometry.   -Patient be discharged in stable and improved condition with outpatient follow-up with pulmonary and  cardiology.   Hyperkalemia -K+ improved from 5.3 -> 4.4 -> 4.2 -> 4.0>> 3.5>> 3.9 -Patient on IV Lasix during the hospitalization. -Hyperkalemia had resolved by day of discharge.  Obesity (BMI 38-10.1) -Complicates overall prognosis and care -Estimated body mass index is 36.03 kg/m as calculated from the following:   Height as of this encounter: $RemoveBeforeD'5\' 8"'[id number]$  (1.727 m).   Weight as of this encounter: 107.5 kg.  -Weight Loss and Dietary Counseling given   Normocytic anemia - Patient's hemoglobin/hematocrit is relatively stable and and trend has gone from 9.7/30.4 -> 9.1/29.9 -> 9.4/30.3 -> 9.4/30.2 -> 10.3/32.9>> 9.6/30.7>> 9.6/31.2>> 10.0/32.1>> 10.0/31.9 -Likely anemia of chronic kidney disease -Anemia panel was checked and iron level was 33, U IBC is 261, TIBC 294, saturation ratios of 11%, ferritin level 111, folate level 11.1, vitamin B12 626 -Nephrology recommended transfusing as needed hemoglobin for hemoglobin < 7 -Outpatient follow-up with nephrology and PCP.    Pulmonary hypertension (HCC) -Patient status post right heart cath. -His vasculitis panel was negative but his kappa and lambda light chains are slightly elevated -he is off of CRRT and has been transferred to [facility] for intermittent hemodialysis -Underwent 24-hour urine protein collection; urine immunofixation. -Once he is euvolemic pulmonary recommended repeating CT scan to evaluate his parenchyma and nodular infiltrates which was done on day of discharge 06/08/2021;  -Fluid was removed via CRRT and will be now controlled with intermittent hemodialysis but renal recommended no dialysis at this time and his Temp Cath removed; -Was on Lasix 80 mg IV every 12 hours  and this has been increased to 120 mg IV every 12 hours per cardiology recommendations. -Patient diuresed well and was subsequently transitioned to oral torsemide 80 mg daily per cardiology. -Outpatient follow-up with cardiology and pulmonary.  Acute kidney  injury superimposed on CKD ([place]) -See AKI  Restrictive cardiomyopathy ([place]) - There is clinical suspicion for hypertensive heart disease but needs to rule out amyloid and he has a hematology referral.  He is getting a myeloma panel sent out and a 24-hour urine protein.  -SPEP had immunofixation that was normal  -His kappa and lambda light chains were elevated and his ratio was elevated.  -We will consider obtaining a fat pad biopsy and and/or bone marrow biopsy and will need to discuss with hematology further -echo was unchanged but showed an more vigorous elevated LV function. 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 twice daily as well as Imdur 120 mg p.o. daily, Norvasc, clonidine, hydralazine. 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 -Patient diuresed well and was transitioned to oral torsemide on day of discharge. -Outpatient  follow-up with cardiology.    Acute on chronic diastolic CHF (congestive heart failure) ([place]) -See Restrictive Cardiomyopathy -Cardiology consulted and recommending continuing Coreg 25 mg p.o. twice daily, Imdur 120 g p.o. daily they have increased her Norvasc to 10 mg p.o. daily.  They are recommending no ACEi/ARB/ARNI or MRA given his renal function worsening -Status post right heart catheterization 05/31/2021. - Cath showed " Ao sat 90%, PA sat 62%, PA pressure 83/37, mean PA pressure 54 mm Hg; mean PCWP 38 mm Hg; prominent V waves on wedge tracing; CO 7.9 L/min; CI 3.6. Severe systemic hypertension. Hemodynamic findings consistent with severe pulmonary hypertension. He still appears quite volume overloaded. rominent V wave on wedge tracing may represent restrictive physiology. Continue medical therapy including volume removal with dialysis."  -Further Care per Cards and IV Lasix increased to 120 mg twice daily.  Patient to be transition to oral torsemide per cardiology. -Patient is - 29.423 L during this hospitalization. -Patient be discharged on torsemide 80 mg daily in addition to other cardiac medications of Coreg, Imdur, hydralazine, Norvasc.  -Outpatient follow-up with cardiology.     Uncontrolled type 2 diabetes mellitus with hyperglycemia (HCC) -Hemoglobin A1c 6.6 (05/21/2021 ). -Patient was placed on a resistant sign scale insulin before meals and at bedtime and also has meal coverage with 4 units 3 times daily. -Patient with discharge home back on home regimen of Lantus 5 units daily, glipizide.  Metformin discontinued secondary to renal function. -Outpatient follow-up with PCP.  Hypertensive  urgency  -Off of the nitro drip and Cardene drips -Cardiology initiated carvedilol 25 mg p.o. twice daily, Imdur 120 mg p.o. daily, clonidine 0.3 mg p.o. 3 times daily, Norvasc 10 mg daily, hydralazine 75mg  3 times daily. -Cardiology recommended resuming minoxidil if blood pressure still  remain uncontrolled and not at goal. -Blood pressure improved on above regimen. -Patient had been removed off of the hydralazine but since his antihistone testing was negative they discussed that it was safe for him to take the hydralazine but he refused it on 05/28/2021 however subsequently agreed and was placed back on hydralazine -Currently cardiology recommended no ACE inhibitor/ARB/ARNI or MRI given his renal function -Outpatient follow-up with cardiology   AKI (acute kidney injury) ([place]) -Has a history of CKD stage IIIb with a baseline creatinine 1.8-3 -His creatinine on admission was 2.4 and peaked at 3.9 so CRRT was started on 05/23/2021 -Nephrology felt that he had a cardiorenal cause of his AKI and had some proteinuria which was quantified at 400 mg to 4 1300 mg p.o. daily approximately and they felt that this is in the nonnephrotic proteinuria. -He was started on CRRT 05/23/2018 for massive volume overload and was weaned off oxygen his chest x-ray had improved. -He was transferred to [facility] for intermittent hemodialysis and his CRRT was discontinued on 05/28/2021; Nephrology feels no indication for hemodialysis at this point and feel that he may have some renal recovery and may be plateauing -Patient was subsequently placed on diuretics with Lasix 120 mg IV twice daily. -Patient had good urine output for the rest of the hospitalization and was -29.423 L during the hospitalization. -Patient's BUNs/creatinine went from 33/1.62 -> 34/1.76 -> 38/2.32 and is now elevated slightly at 51/2.60 -> 58/3.12 -> 58/2.88 -> 63/3.02 -> 61/3.03 -> 60/3.02>>> 58/2.79>>> 58/3.01>> 58/3.11>>> 60/2.85 by day of discharge. -Patient was transitioned from IV Lasix to oral torsemide 80 mg daily per cardiology recommendations. -Outpatient follow-up with nephrology/[facility] in the outpatient setting postdischarge.         Procedures: CT chest 05/21/2021, 06/08/2021 Renal ultrasound 05/20/2021 2D echo  05/18/2021 Right heart cardiac catheterization 05/31/2021 Renal artery duplex 05/21/2021  Consultations: Nephrology: Dr. Candiss Norse 05/20/2021 Cardiology: Dr. Gasper Sells 05/27/2021 PCCM: Dr. Chase Caller  Discharge Exam: Vitals:   06/08/21 0752 06/08/21 1130  BP: (!) 152/90 (!) 151/98  Pulse:    Resp:  18  Temp:    SpO2:      General: NAD Cardiovascular: Regular rate rhythm no murmurs rubs or gallops. + JVD.  No lower extremity edema. Respiratory: Clear to auscultation bilaterally.  No wheezes, no crackles, no rhonchi.  Discharge Instructions   Discharge Instructions     Diet - low sodium heart healthy   Complete by: As directed    Increase activity slowly   Complete by: As directed       Allergies as of 06/08/2021   No Known Allergies      Medication List     STOP taking these medications    atorvastatin 10 MG tablet Commonly known as: LIPITOR   furosemide 40 MG tablet Commonly known as: Lasix   metFORMIN 1000 MG tablet Commonly known as: GLUCOPHAGE   valsartan 40 MG tablet Commonly known as: DIOVAN       TAKE these medications    acetaminophen 325 MG tablet Commonly known as: TYLENOL Take 2 tablets (650 mg total) by mouth every 4 (four) hours as needed for headache or mild pain.   albuterol 108 (90 Base) MCG/ACT inhaler Commonly known  as: VENTOLIN HFA Inhale 2 puffs into the lungs every 6 (six) hours as needed for wheezing or shortness of breath.   amLODipine 10 MG tablet Commonly known as: NORVASC Take 1 tablet (10 mg total) by mouth daily.   aspirin EC 81 MG tablet Take 1 tablet (81 mg total) by mouth daily.   carvedilol 25 MG tablet Commonly known as: COREG Take 1 tablet (25 mg total) by mouth 2 (two) times daily with a meal.   cloNIDine 0.3 MG tablet Commonly known as: CATAPRES Take 1 tablet (0.3 mg total) by mouth 3 (three) times daily.   glipiZIDE 10 MG 24 hr tablet Commonly known as: Glucotrol XL Take 1 tablet (10 mg total) by mouth  daily with breakfast.   hydrALAZINE 25 MG tablet Commonly known as: APRESOLINE Take 3 tablets (75 mg total) by mouth every 8 (eight) hours. What changed:  medication strength how much to take when to take this   INSULIN SYRINGE .5CC/29G 29G X 1/2" 0.5 ML Misc Commonly known as: B-D INS SYR ULTRAFINE .5CC/29G 1 each by Does not apply route at bedtime.   isosorbide mononitrate 120 MG 24 hr tablet Commonly known as: IMDUR Take 1 tablet (120 mg total) by mouth daily. Start taking on: June 09, 2021 What changed:  medication strength how much to take   Lancets Misc 1 each by Does not apply route 4 (four) times daily -  before meals and at bedtime.   Lantus SoloStar 100 UNIT/ML Solostar Pen Generic drug: insulin glargine Inject 5 Units into the skin daily at 10 pm. What changed: Another medication with the same name was removed. Continue taking this medication, and follow the directions you see here.   pantoprazole 40 MG tablet Commonly known as: PROTONIX Take 1 tablet (40 mg total) by mouth at bedtime.   Pen Needles 31G X 6 MM Misc 1 each by Does not apply route at bedtime.   Torsemide 40 MG Tabs Take 80 mg by mouth daily.   True Metrix Air Glucose Meter Devi 1 each by Does not apply route 4 (four) times daily -  before meals and at bedtime.   True Metrix Blood Glucose Test test strip Generic drug: glucose blood Use as instructed What changed:  how much to take how to take this when to take this additional instructions       No Known Allergies  Follow-up Information     [name], Kardie, DO Follow up on 06/24/2021.   Specialty: Cardiology Why: 9AM Contact information: [address] [place] [address] [phone]         [name], NP. Schedule an appointment as soon as possible for a visit in 1 week(s).   Specialty: Adult Health Nurse Practitioner Contact information: [address] [place] [address] [phone]          Rexene Agent, MD. Schedule an appointment as soon as possible for a visit in 2 week(s).   Specialty: Nephrology Why: Office will call with outpatient follow-up appointment. Contact information: [place] [address] [phone]         Brand Males, MD. Schedule an appointment as soon as possible for a visit in 3 week(s).   Specialty: Pulmonary Disease Why: Follow-up in 2 to 3 weeks. Contact information: [place] [address] [place] [address] [phone]                  The results of  significant diagnostics from this hospitalization (including imaging, microbiology, ancillary and laboratory) are listed below for reference.    Significant Diagnostic Studies: DG Chest 1 View  Result Date: 05/20/2021 CLINICAL DATA:  Acute respiratory distress syndrome. EXAM: CHEST  1 VIEW COMPARISON:  05/19/2021 and CT chest 01/12/2021. FINDINGS: Trachea is midline. Heart is enlarged, stable. Diffuse bilateral airspace consolidation with minimal improvement in right lower lobe aeration. No definite pleural fluid. IMPRESSION: Severe diffuse bilateral airspace opacification, indicative of edema or pneumonia, with slight interval improvement in aeration in the right lower lobe. Electronically Signed   By: Lorin Picket M.D.   On: 05/20/2021 08:13   DG Chest 1 View  Result Date: 05/19/2021 CLINICAL DATA:  ARDS follow-up. EXAM: CHEST  1 VIEW COMPARISON:  Portable chest yesterday at 6:52 a.m. FINDINGS: 5:19 a.m., 05/19/2021. Cardiomegaly. Central vascular prominence is noted but not well seen due to widespread dense airspace disease. Small right pleural effusion appears similar. No significant left effusion is seen. No pneumothorax. Stable mediastinal configuration. IMPRESSION: Cardiomegaly with diffuse dense, fairly confluent airspace disease from the center to the periphery with relative apical and basal sparing. Dense pulmonary edema, pneumonia or combination  are the most likely etiologies. No significant improvement or worsening is seen. Electronically Signed   By: Telford Nab M.D.   On: 05/19/2021 07:09   CT CHEST WO CONTRAST  Result Date: 06/08/2021 CLINICAL DATA:  Re-evaluation of bilateral pneumonia. EXAM: CT CHEST WITHOUT CONTRAST TECHNIQUE: Multidetector CT imaging of the chest was performed following the standard protocol without IV contrast. RADIATION DOSE REDUCTION: This exam was performed according to the departmental dose-optimization program which includes automated exposure control, adjustment of the mA and/or kV according to patient size and/or use of iterative reconstruction technique. COMPARISON:  05/21/2021 FINDINGS: Cardiovascular: No significant vascular findings. Stable cardiomegaly. No pericardial effusion. Coronary artery atherosclerosis. Mediastinum/Nodes: No enlarged mediastinal or axillary lymph nodes. Thyroid gland, trachea, and esophagus demonstrate no significant findings. Lungs/Pleura: Significant interval improvement of bilateral airspace disease with mild residual airspace disease in the right upper lobe, right middle lobe and to lesser extent bilateral lower lobes. Relative areas of ground-glass opacities which may be due to air trapping. 3 mm subpleural right upper lobe pulmonary nodule. Interval resolution of the right pleural effusion. Trace left pleural effusion. No pneumothorax. Upper Abdomen: No acute abnormality. Musculoskeletal: No acute osseous abnormality. No aggressive osseous lesion. IMPRESSION: 1. Significant interval improvement bilateral airspace disease with mild residual airspace disease in the right upper lobe, right middle lobe and to lesser extent bilateral lower lobes. 2. Interval resolution of the right pleural effusion. Trace left pleural effusion. Electronically Signed   By: Kathreen Devoid M.D.   On: 06/08/2021 10:44   CT CHEST WO CONTRAST  Result Date: 05/21/2021 CLINICAL DATA:  Respiratory illness,  nondiagnostic xray EXAM: CT CHEST WITHOUT CONTRAST TECHNIQUE: Multidetector CT imaging of the chest was performed following the standard protocol without IV contrast. RADIATION DOSE REDUCTION: This exam was performed according to the departmental dose-optimization program which includes automated exposure control, adjustment of the mA and/or kV according to patient size and/or use of iterative reconstruction technique. COMPARISON:  01/12/2021.  Chest x-ray today. FINDINGS: Cardiovascular: Heart is mildly enlarged. Scattered coronary artery calcifications. Aorta normal caliber. Mediastinum/Nodes: No mediastinal, hilar, or axillary adenopathy. Trachea and esophagus are unremarkable. Thyroid unremarkable. Lungs/Pleura: Extensive diffuse bilateral airspace disease, most pronounced in the upper lobes, right greater than left. Small right pleural effusion. Upper Abdomen: Trace perihepatic ascites. Musculoskeletal: Chest wall soft tissues  are unremarkable. No acute bony abnormality. IMPRESSION: Cardiomegaly, scattered coronary artery disease. Extensive bilateral airspace disease, right greater than left, most pronounced in the upper lobes. Favor pneumonia. Small right pleural effusion. Trace perihepatic ascites. Electronically Signed   By: Rolm Baptise M.D.   On: 05/21/2021 19:01   CARDIAC CATHETERIZATION  Result Date: 05/31/2021   Ao sat 90%, PA sat 62%, PA pressure 83/37, mean PA pressure 54 mm Hg; mean PCWP 38 mm Hg; prominent V waves on wedge tracing; CO 7.9 L/min; CI 3.6.   Severe systemic hypertension.   Hemodynamic findings consistent with severe pulmonary hypertension. He still appears quite volume overloaded.  Prominent V wave on wedge tracing may represent restrictive physiology. Continue medical therapy including volume removal with dialysis.   US RENAL  Result Date: 05/20/2021 CLINICAL DATA:  Acute on chronic kidney disease. EXAM: RENAL / URINARY TRACT ULTRASOUND COMPLETE COMPARISON:  May 09, 2021.  FINDINGS: Right Kidney: Renal measurements: 11.8 x 5.5 x 4.7 cm = volume: 158 mL. Increased echogenicity of renal parenchyma is noted suggesting medical renal disease. No mass or hydronephrosis visualized. Left Kidney: Renal measurements: 11.2 x 5.0 x 4.4 cm = volume: 130 mL. Increased echogenicity of renal parenchyma is noted suggesting medical renal disease. No mass or hydronephrosis visualized. Bladder: Completely decompressed and therefore not well visualized. Other: Mild ascites is noted in the pelvis. IMPRESSION: Increased echogenicity of renal parenchyma is noted bilaterally suggesting medical renal disease. No hydronephrosis or renal obstruction is noted. Mild ascites is noted in the pelvis. Electronically Signed   By: Marijo Conception M.D.   On: 05/20/2021 09:09   DG CHEST PORT 1 VIEW  Result Date: 06/02/2021 CLINICAL DATA:  Dyspnea EXAM: PORTABLE CHEST 1 VIEW COMPARISON:  06/02/2021 FINDINGS: Lungs are symmetrically well expanded. Superimposed asymmetric airspace infiltrates, more severe within the right lung, appear slightly improved since prior examination and significantly improved since remote prior examination of 05/23/2021 in keeping with mild residual edema or inflammatory infiltrate. No pneumothorax or pleural effusion. Cardiomegaly is stable. No acute bone abnormality. IMPRESSION: Slight interval improvement in bilateral asymmetric airspace infiltrate, likely representing residual edema or inflammatory infiltrate Stable cardiomegaly Electronically Signed   By: Fidela Salisbury M.D.   On: 06/02/2021 19:58   DG CHEST PORT 1 VIEW  Result Date: 06/02/2021 CLINICAL DATA:  Shob Evaluate Infiltrate pleural effusion or pneumothorax EXAM: PORTABLE CHEST 1 VIEW COMPARISON:  05/30/2021 and older exams. FINDINGS: Mild enlargement of the cardiopericardial silhouette. Interstitial hazy airspace lung opacities are noted in a patchy distribution, most evident in the right mid to upper lung, without change  from the most recent prior exam. No new lung abnormalities. No pneumothorax. Right internal jugular central venous line has been removed. IMPRESSION: 1. Patchy bilateral lung infiltrates are without significant change from the most recent prior study, overall improved from the CT dated 05/21/2021. No new lung abnormalities. Electronically Signed   By: Lajean Manes M.D.   On: 06/02/2021 09:07   DG CHEST PORT 1 VIEW  Result Date: 05/30/2021 CLINICAL DATA:  Pneumonia. EXAM: PORTABLE CHEST 1 VIEW COMPARISON:  05/26/2021 FINDINGS: A right jugular catheter remains in place, terminating over the mid SVC. The cardiac silhouette remains enlarged. Interstitial and airspace opacities in the right greater than left lungs have slightly improved. No sizable pleural effusion or pneumothorax is identified. IMPRESSION: Slight improvement of bilateral lung infiltrates. Electronically Signed   By: Logan Bores M.D.   On: 05/30/2021 08:22   DG Chest [facility] [address]  Result Date: 05/26/2021 CLINICAL DATA:  Acute respiratory failure EXAM: PORTABLE CHEST 1 VIEW COMPARISON:  Radiograph 05/23/2021 FINDINGS: Right neck catheter tip overlies the distal superior vena cava. Left neck catheter is been removed. Unchanged enlarged cardiac silhouette. There are bilateral interstitial and airspace opacities, right greater than left, not significantly changed from prior. There is a small right pleural effusion, unchanged. No visible pneumothorax. There is no acute osseous abnormality. IMPRESSION: Left neck catheter has been removed.  Unchanged right neck catheter. Unchanged cardiomegaly with right greater left interstitial and airspace disease and small right effusion, favoring multifocal infection over pulmonary edema. Electronically Signed   By: Maurine Simmering M.D.   On: 05/26/2021 08:47   DG Chest Port 1 View  Result Date: 05/23/2021 CLINICAL DATA:  Central line placement EXAM: PORTABLE CHEST 1 VIEW COMPARISON:  Earlier today at 12:24 p.m.  FINDINGS: 2:09 p.m. Left internal jugular line tip at high SVC. Placement of a right internal jugular line with tip at mid SVC. Midline trachea. Moderate cardiomegaly. Suspect tiny right pleural effusion. No pneumothorax. Right greater than left interstitial and airspace disease is similar. IMPRESSION: New right-sided central line, terminating at the mid SVC. Similar cardiomegaly with right greater than left interstitial and airspace disease. Favor infection over pulmonary edema, given appearance on 05/21/2021 CT. Electronically Signed   By: Abigail Miyamoto M.D.   On: 05/23/2021 14:43   DG Chest Port 1 View  Result Date: 05/23/2021 CLINICAL DATA:  Shortness of breath EXAM: PORTABLE CHEST 1 VIEW COMPARISON:  Chest radiograph 1 day prior FINDINGS: Left IJ vascular catheter is in stable position terminating in the region of the distal brachiocephalic vein The cardiac silhouette is enlarged, unchanged. The mediastinal contours are stable. Extensive opacities in the right lung predominantly in the right upper lobe have worsened in the interim. The right costophrenic angle is partially cut off. Aeration of the left lung is unchanged. There is no significant left effusion. There is no pneumothorax. The bones are stable. IMPRESSION: Extensive opacities in the right lung predominantly in the right upper lobe have worsened in the interim, concerning for worsened infection. Electronically Signed   By: Valetta Mole M.D.   On: 05/23/2021 12:42   DG Chest Port 1 View  Result Date: 05/22/2021 CLINICAL DATA:  Dyspnea EXAM: PORTABLE CHEST 1 VIEW COMPARISON:  None. FINDINGS: LEFT central venous line with tip in the distal brachiocephalic vein. Unchanged. Stable large cardiac silhouette. Perihilar airspace disease is slightly improved. No pneumothorax. Small RIGHT effusion. IMPRESSION: Mild improvement in bilateral airspace disease representing edema versus multifocal pneumonia Electronically Signed   By: Suzy Bouchard M.D.    On: 05/22/2021 11:15   DG CHEST PORT 1 VIEW  Result Date: 05/21/2021 CLINICAL DATA:  Central line placement. EXAM: PORTABLE CHEST 1 VIEW COMPARISON:  Chest x-ray from same day at 1024 hours. FINDINGS: Interval repositioning of the left internal jugular central venous catheter with the tip now in the proximal SVC. Unchanged cardiomegaly. Similar right greater than left lung airspace disease and trace right pleural effusion. No pneumothorax. No acute osseous abnormality. IMPRESSION: 1. Interval repositioning of the left internal jugular central venous catheter with the tip now in the proximal SVC. 2. Similar bilateral airspace disease and trace right pleural effusion. Electronically Signed   By: Titus Dubin M.D.   On: 05/21/2021 12:09   DG CHEST PORT 1 VIEW  Result Date: 05/21/2021 CLINICAL DATA:  Central line placement. EXAM: PORTABLE CHEST 1 VIEW COMPARISON:  Chest x-ray from same day  at 0431 hours. FINDINGS: New left internal jugular central venous catheter with the tip oriented superiorly into the right brachiocephalic vein. Stable cardiomegaly. Right greater than left lung airspace disease appears mildly improved. Suspected trace right pleural effusion, also unchanged. No pneumothorax. No acute osseous abnormality. IMPRESSION: 1. New left internal jugular central venous catheter with the tip oriented superiorly into the right brachiocephalic vein. Recommend repositioning. 2. Somewhat improved bilateral airspace disease which could reflect ARDS or pulmonary edema. Electronically Signed   By: Titus Dubin M.D.   On: 05/21/2021 10:43   DG CHEST PORT 1 VIEW  Result Date: 05/21/2021 CLINICAL DATA:  Respiratory failure EXAM: PORTABLE CHEST 1 VIEW COMPARISON:  Radiograph 05/20/2021 FINDINGS: Unchanged, enlarged cardiomediastinal silhouette. There is severe diffuse airspace disease, not significantly changed in comparison to prior exam. There is no large pleural effusion. There is no visible  pneumothorax. There is no acute osseous abnormality. IMPRESSION: Severe diffuse airspace disease, not significantly changed from prior exam. This could represent severe pulmonary edema and/or multifocal pneumonia. Electronically Signed   By: Maurine Simmering M.D.   On: 05/21/2021 08:13   DG CHEST PORT 1 VIEW  Result Date: 05/18/2021 CLINICAL DATA:  Acute hypoxic respiratory failure EXAM: PORTABLE CHEST 1 VIEW COMPARISON:  Earlier the same day FINDINGS: Dense bilateral airspace disease. Alveolar edema and pneumonia are both possible at this pattern. Cardiomegaly. Right-sided pleural effusion seen at the base. No pneumothorax IMPRESSION: Continued severe airspace disease with cardiomegaly and right pleural effusion. Electronically Signed   By: Jorje Guild M.D.   On: 05/18/2021 07:23   DG Chest Port 1 View  Result Date: 05/18/2021 CLINICAL DATA:  Shortness of breath and hypoxia. EXAM: PORTABLE CHEST 1 VIEW COMPARISON:  PA Lat 05/08/2021. FINDINGS: The heart is moderately enlarged. There is increased central vascular prominence. There are diffuse dense patchy opacities of the lungs with relative peripheral/apical sparing and increasing small right-greater-than-left pleural effusions. Similar findings were present on the prior study but the opacities are much denser today. The mediastinal configuration is stable. Thoracic cage is intact. IMPRESSION: Bilateral dense airspace disease with relative peripheral and apical sparing and enlarging small right-greater-than-left pleural effusions. With central vascular prominence this is most likely due to alveolar edema. Underlying pneumonia is certainly possible. Clinical correlation and radiographic follow-up recommended. In all other respects no further changes. Electronically Signed   By: Telford Nab M.D.   On: 05/18/2021 00:51   ECHOCARDIOGRAM COMPLETE  Result Date: 05/18/2021    ECHOCARDIOGRAM REPORT   Patient Name:   KARANVIR BALDERSTON Date of Exam: 05/18/2021  Medical Rec #:  [id number]       Height:       68.0 in Accession #:    [id number]      Weight:       265.0 lb Date of Birth:  Jun 12, 1980       BSA:          2.303 m Patient Age:    41 years        BP:           163/77 mmHg Patient Gender: M               HR:           92 bpm. Exam Location:  Inpatient Procedure: 2D Echo, Cardiac Doppler, Color Doppler and Strain Analysis STAT ECHO Indications:    R06.02 SOB  History:        Patient has prior history of Echocardiogram examinations, most  recent 01/12/2021. CHF, Abnormal ECG, Stroke,                 Arrythmias:Tachycardia, Signs/Symptoms:Edema; Risk                 Factors:Diabetes and Hypertension. History of substance abuse.  Sonographer:    Roseanna Rainbow RDCS Referring Phys: [phone] [facility]  Sonographer Comments: Technically difficult study due to poor echo windows. Global longitudinal strain was attempted. Very difficult study due to high fowler's position. IMPRESSIONS  1. Left ventricular ejection fraction, by estimation, is 70 to 75%. The left ventricle has hyperdynamic function. The left ventricle has no regional wall motion abnormalities. There is mild concentric left ventricular hypertrophy. Left ventricular diastolic parameters are indeterminate. Elevated left ventricular end-diastolic pressure. The average left ventricular global longitudinal strain is -24.8 %. The global longitudinal strain is normal.  2. Right ventricular systolic function is normal. The right ventricular size is mildly enlarged. There is moderately elevated pulmonary artery systolic pressure. The estimated right ventricular systolic pressure is 44.3 mmHg.  3. The mitral valve is normal in structure. No evidence of mitral valve regurgitation. No evidence of mitral stenosis.  4. The aortic valve is normal in structure. Aortic valve regurgitation is not visualized. No aortic stenosis is present.  5. Aortic dilatation noted. There is mild dilatation of the ascending aorta,  measuring 38 mm.  6. The inferior vena cava is dilated in size with <50% respiratory variability, suggesting right atrial pressure of 15 mmHg. FINDINGS  Left Ventricle: Left ventricular ejection fraction, by estimation, is 70 to 75%. The left ventricle has hyperdynamic function. The left ventricle has no regional wall motion abnormalities. The average left ventricular global longitudinal strain is -24.8  %. The global longitudinal strain is normal. The left ventricular internal cavity size was normal in size. There is mild concentric left ventricular hypertrophy. Left ventricular diastolic parameters are indeterminate. Elevated left ventricular end-diastolic pressure. Right Ventricle: The right ventricular size is mildly enlarged. No increase in right ventricular wall thickness. Right ventricular systolic function is normal. There is moderately elevated pulmonary artery systolic pressure. The tricuspid regurgitant velocity is 2.75 m/s, and with an assumed right atrial pressure of 15 mmHg, the estimated right ventricular systolic pressure is 15.4 mmHg. Left Atrium: Left atrial size was normal in size. Right Atrium: Right atrial size was normal in size. Pericardium: There is no evidence of pericardial effusion. Mitral Valve: The mitral valve is normal in structure. No evidence of mitral valve regurgitation. No evidence of mitral valve stenosis. Tricuspid Valve: The tricuspid valve is normal in structure. Tricuspid valve regurgitation is trivial. No evidence of tricuspid stenosis. Aortic Valve: The aortic valve is normal in structure. Aortic valve regurgitation is not visualized. No aortic stenosis is present. Pulmonic Valve: The pulmonic valve was normal in structure. Pulmonic valve regurgitation is trivial. No evidence of pulmonic stenosis. Aorta: Aortic dilatation noted. There is mild dilatation of the ascending aorta, measuring 38 mm. Venous: The inferior vena cava is dilated in size with less than 50% respiratory  variability, suggesting right atrial pressure of 15 mmHg. IAS/Shunts: No atrial level shunt detected by color flow Doppler.  LEFT VENTRICLE PLAX 2D LVIDd:         5.10 cm     Diastology LVIDs:         3.40 cm     LV e' medial:    4.40 cm/s LV PW:         1.30 cm  LV E/e' medial:  26.1 LV IVS:        1.30 cm     LV e' lateral:   4.60 cm/s LVOT diam:     2.30 cm     LV E/e' lateral: 25.0 LV SV:         109 LV SV Index:   47          2D Longitudinal Strain LVOT Area:     4.15 cm    2D Strain GLS (A2C):   -33.0 %                            2D Strain GLS (A3C):   -19.7 %                            2D Strain GLS (A4C):   -21.7 % LV Volumes (MOD)           2D Strain GLS Avg:     -24.8 % LV vol d, MOD A4C: 94.1 ml LV vol s, MOD A4C: 26.7 ml LV SV MOD A4C:     94.1 ml RIGHT VENTRICLE             IVC RV S prime:     14.10 cm/s  IVC diam: 3.00 cm TAPSE (M-mode): 1.9 cm LEFT ATRIUM           Index        RIGHT ATRIUM           Index LA diam:      4.10 cm 1.78 cm/m   RA Area:     19.00 cm LA Vol (A2C): 45.1 ml 19.58 ml/m  RA Volume:   50.20 ml  21.79 ml/m LA Vol (A4C): 70.0 ml 30.39 ml/m  AORTIC VALVE              PULMONIC VALVE LVOT Vmax:   174.80 cm/s  PR End Diast Vel: 1.38 msec LVOT Vmean:  113.750 cm/s LVOT VTI:    0.262 m  AORTA Ao Root diam: 3.40 cm Ao Asc diam:  3.80 cm MITRAL VALVE                TRICUSPID VALVE MV Area (PHT): 4.15 cm     TV Peak grad:   0.1 mmHg MV Decel Time: 183 msec     TV Vmax:        0.14 m/s MV E velocity: 115.00 cm/s  TR Peak grad:   30.2 mmHg MV A velocity: 66.70 cm/s   TR Vmax:        275.00 cm/s MV E/A ratio:  1.72                             SHUNTS                             Systemic VTI:  0.26 m                             Systemic Diam: 2.30 cm Fransico Him MD Electronically signed by Fransico Him MD Signature Date/Time: 05/18/2021/12:55:54 PM    Final    VAS US RENAL ARTERY DUPLEX  Result Date: 05/21/2021 ABDOMINAL VISCERAL Patient Name:  COTTON BECKLEY  Date of  Exam:    05/21/2021 Medical Rec #: [id number]        Accession #:    [id number] Date of Birth: 03-15-1981        Patient Gender: M Patient Age:   20 years Exam Location:  [facility] Procedure:      VAS US RENAL ARTERY DUPLEX Referring Phys: [id number] Cleaster Corin OLLIS -------------------------------------------------------------------------------- Indications: Chronic kidney disease High Risk Factors: Hypertension, hyperlipidemia, Diabetes, prior CVA. Limitations: Air/bowel gas, obesity and On Bipap machine. Comparison Study: Last renal study 01/15/2021 - patent renal artery with 1-59% Performing Technologist: Oda Cogan RDMS, RVT  Examination Guidelines: A complete evaluation includes B-mode imaging, spectral Doppler, color Doppler, and power Doppler as needed of all accessible portions of each vessel. Bilateral testing is considered an integral part of a complete examination. Limited examinations for reoccurring indications may be performed as noted.  Duplex Findings: +--------------------+--------+--------+------+--------+  Mesenteric           PSV cm/s EDV cm/s Plaque Comments  +--------------------+--------+--------+------+--------+  Aorta Prox              53                              +--------------------+--------+--------+------+--------+  Celiac Artery Origin   188                              +--------------------+--------+--------+------+--------+  SMA Proximal           186                              +--------------------+--------+--------+------+--------+    +------------------+--------+--------+-------+  Right Renal Artery PSV cm/s EDV cm/s Comment  +------------------+--------+--------+-------+  Origin                64       13             +------------------+--------+--------+-------+  Proximal              60       12             +------------------+--------+--------+-------+  Mid                   83       15             +------------------+--------+--------+-------+  Distal                76        12             +------------------+--------+--------+-------+ +-----------------+--------+--------+-------+  Left Renal Artery PSV cm/s EDV cm/s Comment  +-----------------+--------+--------+-------+  Origin               73       13             +-----------------+--------+--------+-------+  Proximal             68       15             +-----------------+--------+--------+-------+  Mid                  73       13             +-----------------+--------+--------+-------+  Distal  39       9              +-----------------+--------+--------+-------+  Technologist observations: Technically difficult study due to patient's body habitus. Renal arteries are patent without significant stenosis noted. +------------+--------+--------+----+-----------+--------+--------+----+  Right Kidney PSV cm/s EDV cm/s RI   Left Kidney PSV cm/s EDV cm/s RI    +------------+--------+--------+----+-----------+--------+--------+----+  Upper Pole   0        0             Upper Pole  15       6        0.61  +------------+--------+--------+----+-----------+--------+--------+----+  Mid          17       4        0.78 Mid         16       9        0.59  +------------+--------+--------+----+-----------+--------+--------+----+  Lower Pole   20       6        0.71 Lower Pole  0                       +------------+--------+--------+----+-----------+--------+--------+----+  Hilar        31       6        0.81 Hilar       21       5        0.75  +------------+--------+--------+----+-----------+--------+--------+----+ +------------------+-----+------------------+-----+  Right Kidney             Left Kidney               +------------------+-----+------------------+-----+  RAR                      RAR                       +------------------+-----+------------------+-----+  RAR (manual)       1.2   RAR (manual)       1.3    +------------------+-----+------------------+-----+  Cortex                   Cortex                     +------------------+-----+------------------+-----+  Cortex thickness         Corex thickness           +------------------+-----+------------------+-----+  Kidney length (cm) 12.03 Kidney length (cm) 12.76  +------------------+-----+------------------+-----+  Summary: Renal:  Right: Patent renal artery without significant stenosis. Abnormal        right Resistive Index. Normal size right kidney. Left:  Patent renal artery without significant stenosis. Abnormal        left Resisitve Index. Normal size of left kidney.  *See table(s) above for measurements and observations.  Diagnosing physician: Deitra Mayo MD  Electronically signed by Deitra Mayo MD on 05/21/2021 at 1:54:52 PM.    Final     Microbiology: No results found for this or any previous visit (from the past 240 hour(s)).   Labs: Basic Metabolic Panel: Recent Labs  Lab 06/04/21 0155 06/05/21 0218 06/06/21 0313 06/07/21 0301 06/08/21 0230  NA 136 137 136 132* 137  K 4.0 3.5 3.9 3.7 3.6  CL 98 99 97* 95* 98  CO2 $Re'27 28 29 26 29  'yba$ GLUCOSE 139* 149* 151* 229* 161*  BUN  60* 58* 58* 58* 60*  CREATININE 3.02* 2.79* 3.01* 3.11* 2.85*  CALCIUM 9.7 9.4 9.4 9.3 9.5  MG 2.2 1.7 2.0 1.9 2.0  PHOS 5.4* 5.4* 5.5* 5.1* 5.6*   Liver Function Tests: Recent Labs  Lab 06/02/21 0235 06/03/21 0311 06/04/21 0155 06/05/21 0218 06/06/21 0313 06/07/21 0301 06/08/21 0230  AST $Re'17 15 22 19  'HVn$ --   --   --   ALT 35 $Remo'30 30 25  'vnrGl$ --   --   --   ALKPHOS 65 68 71 62  --   --   --   BILITOT 0.4 0.7 0.6 0.5  --   --   --   PROT 6.5 6.4* 7.1 6.7  --   --   --   ALBUMIN 2.9* 2.8* 3.3* 3.0* 3.1* 3.2* 3.2*   No results for input(s): LIPASE, AMYLASE in the last 168 hours. No results for input(s): AMMONIA in the last 168 hours. CBC: Recent Labs  Lab 06/02/21 0235 06/03/21 0311 06/04/21 0155 06/05/21 0218 06/06/21 0313 06/07/21 0301 06/08/21 0230  WBC 7.4 8.1 8.2 7.9 7.7 7.5 7.7  NEUTROABS 4.6 4.9 4.5 3.8  --   --   --   HGB 9.4* 9.4*  10.3* 9.6* 9.6* 10.0* 10.0*  HCT 30.3* 30.2* 32.9* 30.7* 31.2* 32.1* 31.9*  MCV 82.8 83.4 83.3 83.2 84.1 84.7 83.9  PLT 187 180 221 212 210 223 232   Cardiac Enzymes: No results for input(s): CKTOTAL, CKMB, CKMBINDEX, TROPONINI in the last 168 hours. BNP: BNP (last 3 results) Recent Labs    05/08/21 1634 05/18/21 0008 05/18/21 0606  BNP 959.9* 760.9* 893.0*    ProBNP (last 3 results) No results for input(s): PROBNP in the last 8760 hours.  CBG: Recent Labs  Lab 06/07/21 1130 06/07/21 1632 06/07/21 2056 06/08/21 0742 06/08/21 1137  GLUCAP 145* 160* 142* 123* 148*       Signed:  Irine Seal MD.  [facility] 06/08/2021, 3:13 PM

## 2021-06-09 ENCOUNTER — Other Ambulatory Visit: Payer: Self-pay | Admitting: Cardiology

## 2021-06-09 ENCOUNTER — Other Ambulatory Visit: Payer: Self-pay | Admitting: Cardiovascular Disease

## 2021-06-09 MED ORDER — TORSEMIDE 20 MG PO TABS
80.0000 mg | ORAL_TABLET | Freq: Once | ORAL | 3 refills | Status: DC
  Filled 2021-06-09: qty 120, 30d supply, fill #0

## 2021-06-09 MED ORDER — TORSEMIDE 20 MG PO TABS: 80.0000 mg | ORAL_TABLET | Freq: Once | ORAL | 3 refills | Status: DC

## 2021-06-09 MED ORDER — TORSEMIDE 40 MG PO TABS: 80.0000 mg | ORAL_TABLET | Freq: Every day | ORAL | 1 refills | Status: DC

## 2021-06-09 NOTE — Progress Notes (Signed)
The patient was unable to fill the prescription for torsemide prescribed 40 mg 2 tablets daily since the 40 mg tablets are only available as a brand name and unaffordable prescription. ?I represcribed his torsemide 80 mg once daily as 4 x 20 mg tablets daily, available generic. ?

## 2021-06-09 NOTE — Progress Notes (Addendum)
06/09/2021 at 0930 ?Received call from patient's wife (phone number [phone]) stating that they did not have the torsemide because there was no prescription, this was a printed prescription from 06/08/2021 (patient's discharge date). ? ?Dr. Bjorn Pippin sent escript for Torsemide to [facility] on [address]. ? ?06/09/2021 at 1315 call received from wife stating that the prescription was $600.00. asked wife if I could call her back as I was in another patients room. ? ?Checked GoodRx for prices and found it at [facility] for $33.32 (for 120 tablets 20 mg each). ? ?Ask Kristi with care management to assist.  ? ?Follow up: the original script was for the name brand of torsemide, this is why the price was $600.00.  Silva Bandy was able to get the 20 mg tablets called in (by Dr. Royann Shivers) for the patient and updated Mrs Gartman.  ? ? ?1730: Patient's wife at [facility]....script is not there or filled. ?Spoke to Dr. Rubie Maid who is a now sending script to [facility] on [address]. ? ?Advised patient's wife to call prior to pickup to confirm script has been received and filled.  ?

## 2021-06-09 NOTE — Addendum Note (Signed)
Addended by: Thurmon Fair on: 06/09/2021 06:14 PM ? ? Modules accepted: Orders ? ?

## 2021-06-10 ENCOUNTER — Other Ambulatory Visit (HOSPITAL_COMMUNITY): Payer: Self-pay

## 2021-06-24 ENCOUNTER — Other Ambulatory Visit: Payer: Self-pay

## 2021-06-24 ENCOUNTER — Other Ambulatory Visit (HOSPITAL_COMMUNITY): Payer: Self-pay

## 2021-06-24 ENCOUNTER — Ambulatory Visit (INDEPENDENT_AMBULATORY_CARE_PROVIDER_SITE_OTHER): Payer: Medicaid Other | Admitting: Cardiology

## 2021-06-24 ENCOUNTER — Encounter: Payer: Self-pay | Admitting: Cardiology

## 2021-06-24 VITALS — BP 162/90 | HR 95 | Ht 68.0 in | Wt 237.0 lb

## 2021-06-24 DIAGNOSIS — Z794 Long term (current) use of insulin: Secondary | ICD-10-CM

## 2021-06-24 DIAGNOSIS — N1832 Chronic kidney disease, stage 3b: Secondary | ICD-10-CM | POA: Insufficient documentation

## 2021-06-24 DIAGNOSIS — I272 Pulmonary hypertension, unspecified: Secondary | ICD-10-CM | POA: Diagnosis not present

## 2021-06-24 DIAGNOSIS — E1169 Type 2 diabetes mellitus with other specified complication: Secondary | ICD-10-CM

## 2021-06-24 DIAGNOSIS — I5032 Chronic diastolic (congestive) heart failure: Secondary | ICD-10-CM

## 2021-06-24 DIAGNOSIS — Z79899 Other long term (current) drug therapy: Secondary | ICD-10-CM | POA: Diagnosis not present

## 2021-06-24 DIAGNOSIS — N184 Chronic kidney disease, stage 4 (severe): Secondary | ICD-10-CM | POA: Insufficient documentation

## 2021-06-24 DIAGNOSIS — E119 Type 2 diabetes mellitus without complications: Secondary | ICD-10-CM

## 2021-06-24 DIAGNOSIS — E785 Hyperlipidemia, unspecified: Secondary | ICD-10-CM

## 2021-06-24 LAB — BASIC METABOLIC PANEL
BUN/Creatinine Ratio: 12 (ref 9–20)
BUN: 30 mg/dL — ABNORMAL HIGH (ref 6–24)
CO2: 27 mmol/L (ref 20–29)
Calcium: 9.8 mg/dL (ref 8.7–10.2)
Chloride: 101 mmol/L (ref 96–106)
Creatinine, Ser: 2.45 mg/dL — ABNORMAL HIGH (ref 0.76–1.27)
Glucose: 146 mg/dL — ABNORMAL HIGH (ref 70–99)
Potassium: 3.9 mmol/L (ref 3.5–5.2)
Sodium: 140 mmol/L (ref 134–144)
eGFR: 33 mL/min/{1.73_m2} — ABNORMAL LOW (ref >59)

## 2021-06-24 LAB — MAGNESIUM: Magnesium: 1.9 mg/dL (ref 1.6–2.3)

## 2021-06-24 MED ORDER — HYDRALAZINE HCL 100 MG PO TABS
100.0000 mg | ORAL_TABLET | Freq: Three times a day (TID) | ORAL | 3 refills | Status: DC
  Filled 2021-06-24: qty 270, 90d supply, fill #0

## 2021-06-24 MED ORDER — HYDRALAZINE HCL 100 MG PO TABS: 100.0000 mg | ORAL_TABLET | Freq: Three times a day (TID) | ORAL | 3 refills | Status: AC

## 2021-06-24 NOTE — Patient Instructions (Signed)
Medication Instructions:  ?Your physician has recommended you make the following change in your medication:  ?INCREASE: Hydralazine 100 mg every 8 hours ?*If you need a refill on your cardiac medications before your next appointment, please call your pharmacy* ? ? ?Lab Work: ?Your physician recommends that you return for lab work in:  ?TODAY: BMET, Mag ?If you have labs (blood work) drawn today and your tests are completely normal, you will receive your results only by: ?MyChart Message (if you have MyChart) OR ?A paper copy in the mail ?If you have any lab test that is abnormal or we need to change your treatment, we will call you to review the results. ? ? ?Testing/Procedures: ?Your physician has recommended that you have a sleep study. This test records several body functions during sleep, including: brain activity, eye movement, oxygen and carbon dioxide blood levels, heart rate and rhythm, breathing rate and rhythm, the flow of air through your mouth and nose, snoring, body muscle movements, and chest and belly movement. ? ? ? ?Follow-Up: ?At [facility], you and your health needs are our priority.  As part of our continuing mission to provide you with exceptional heart care, we have created designated Provider Care Teams.  These Care Teams include your primary Cardiologist (physician) and Advanced Practice Providers (APPs -  Physician Assistants and Nurse Practitioners) who all work together to provide you with the care you need, when you need it. ? ?We recommend signing up for the patient portal called "MyChart".  Sign up information is provided on this After Visit Summary.  MyChart is used to connect with patients for Virtual Visits (Telemedicine).  Patients are able to view lab/test results, encounter notes, upcoming appointments, etc.  Non-urgent messages can be sent to your provider as well.   ?To learn more about what you can do with MyChart, go to [email or web address].   ? ?Your next appointment:    ?3 month(s) ? ?The format for your next appointment:   ?In Person ? ?Provider:   ?Thomasene Ripple, DO   ? ? ?Other Instructions ?  ?

## 2021-06-24 NOTE — Progress Notes (Signed)
?Cardiology Office Note:   ? ?Date:  06/24/2021  ? ?ID:  Micheal Levy, DOB 12/04/1980, MRN [id number] ? ?PCP:  [name], NP  ?Cardiologist:  Berniece Salines, DO  ?Electrophysiologist:  None  ? ?Referring MD: [name], NP  ? ?" I am doing fine" ? ?History of Present Illness:   ? ?Micheal Levy is a 41 y.o. male with a hx of severe pulmonary hypertension, diabetes mellitus, hypertension with hypertensive heart disease ejection fraction on January 12, 2021 was 57 to 60% with grade 3 diastolic dysfunction,, chronic diastolic heart failure, history of CVA, chronic kidney disease, history of seizures and substance abuse in remission here for a follow up  visit. ? ?At the patient's initial visit with me which was back in November 2022 he was significantly hypertensive.  We had him take his medications he waited a few hours and then we send the patient home.  I did start the patient on valsartan. ? ?Since I saw the patient he has been hospitalized at [facility] for acute on chronic diastolic heart failure.  His medications were optimized.  He also underwent right heart catheterization revealing severe pulmonary hypertension. ?Since the hospitalization he has been taking torsemide 80 mg once a day.  He tells me he has been putting out a lot of fluid.  No other complaints at this time. ? ? ?Past Medical History:  ?Diagnosis Date  ? Chronic kidney disease   ? Diabetes mellitus without complication ([place])   ? Elevated serum cholesterol 04/2019  ? Elevated serum creatinine 04/2019  ? Hypertension   ? Hypertensive urgency 04/2019  ? Seizures ([place]) 03/2017  ? x 2   ? Stroke [facility]) 03/2017  ? Substance abuse ([place])   ? Vitamin D deficiency 04/2019  ? ? ?Past Surgical History:  ?Procedure Laterality Date  ? RIGHT HEART CATH N/A 05/31/2021  ? Procedure: RIGHT HEART CATH;  Surgeon: Jettie Booze, MD;  Location: [place] CV LAB;  Service: Cardiovascular;  Laterality: N/A;  ? ? ?Current Medications: ?Current  Meds  ?Medication Sig  ? acetaminophen (TYLENOL) 325 MG tablet Take 2 tablets (650 mg total) by mouth every 4 (four) hours as needed for headache or mild pain.  ? albuterol (VENTOLIN HFA) 108 (90 Base) MCG/ACT inhaler Inhale 2 puffs into the lungs every 6 (six) hours as needed for wheezing or shortness of breath.  ? amLODipine (NORVASC) 10 MG tablet Take 1 tablet (10 mg total) by mouth daily.  ? aspirin EC 81 MG tablet Take 1 tablet (81 mg total) by mouth daily.  ? Blood Glucose Monitoring Suppl (TRUE METRIX AIR GLUCOSE METER) DEVI 1 each by Does not apply route 4 (four) times daily -  before meals and at bedtime.  ? carvedilol (COREG) 25 MG tablet Take 1 tablet (25 mg total) by mouth 2 (two) times daily with a meal.  ? cloNIDine (CATAPRES) 0.3 MG tablet Take 1 tablet (0.3 mg total) by mouth 3 (three) times daily.  ? glipiZIDE (GLUCOTROL XL) 10 MG 24 hr tablet Take 1 tablet (10 mg total) by mouth daily with breakfast.  ? glucose blood (TRUE METRIX BLOOD GLUCOSE TEST) test strip Use as instructed (Patient taking differently: 1 each by Other route as directed.)  ? insulin glargine (LANTUS SOLOSTAR) 100 UNIT/ML Solostar Pen Inject 5 Units into the skin daily at 10 pm.  ? Insulin Pen Needle (PEN NEEDLES) 31G X 6 MM MISC 1 each by Does not apply route  at bedtime.  ? INSULIN SYRINGE .5CC/29G (B-D INS SYR ULTRAFINE .5CC/29G) 29G X 1/2" 0.5 ML MISC 1 each by Does not apply route at bedtime.  ? isosorbide mononitrate (IMDUR) 120 MG 24 hr tablet Take 1 tablet (120 mg total) by mouth daily.  ? Lancets MISC 1 each by Does not apply route 4 (four) times daily -  before meals and at bedtime.  ? pantoprazole (PROTONIX) 40 MG tablet Take 1 tablet (40 mg total) by mouth at bedtime.  ? [DISCONTINUED] hydrALAZINE (APRESOLINE) 100 MG tablet Take 1 tablet (100 mg total) by mouth every 8 (eight) hours.  ? [DISCONTINUED] hydrALAZINE (APRESOLINE) 25 MG tablet Take 3 tablets (75 mg total) by mouth every 8 (eight) hours.  ?  ? ?Allergies:    Patient has no known allergies.  ? ?Social History  ? ?Socioeconomic History  ? Marital status: Single  ?  Spouse name: Not on file  ? Number of children: Not on file  ? Years of education: Not on file  ? Highest education level: Not on file  ?Occupational History  ? Not on file  ?Tobacco Use  ? Smoking status: Former  ?  Types: Cigarettes  ?  Quit date: 04/25/1999  ?  Years since quitting: 22.1  ? Smokeless tobacco: Never  ? Tobacco comments:  ?  2001  ?Vaping Use  ? Vaping Use: Never used  ?Substance and Sexual Activity  ? Alcohol use: Yes  ?  Comment: occ, none since Dec 2018  ? Drug use: Yes  ?  Types: Cocaine, Marijuana  ?  Comment: weed-daily. cocaine- once or twice a month, 05/25/17 avg 1/2 gm daily, no cocaine since Dec 2018   ? Sexual activity: Yes  ?  Birth control/protection: None  ?Other Topics Concern  ? Not on file  ?Social History Narrative  ? ** Merged History Encounter **  ? 05/25/17 Lives with girlfriend  ? 05/25/17 currently out of work  ? Education- GED  ? Children- 3  ?  no caffeine use  ? ?Social Determinants of Health  ? ?Financial Resource Strain: Not on file  ?Food Insecurity: Not on file  ?Transportation Needs: Not on file  ?Physical Activity: Not on file  ?Stress: Not on file  ?Social Connections: Not on file  ?  ? ?Family History: ?The patient's family history includes Diabetes in his father and sister; Heart attack in his father; Hypertension in his brother. ? ?ROS:   ?Review of Systems  ?Constitution: Negative for decreased appetite, fever and weight gain.  ?HENT: Negative for congestion, ear discharge, hoarse voice and sore throat.   ?Eyes: Negative for discharge, redness, vision loss in right eye and visual halos.  ?Cardiovascular: Negative for chest pain, dyspnea on exertion, leg swelling, orthopnea and palpitations.  ?Respiratory: Negative for cough, hemoptysis, shortness of breath and snoring.   ?Endocrine: Negative for heat intolerance and polyphagia.  ?Hematologic/Lymphatic: Negative  for bleeding problem. Does not bruise/bleed easily.  ?Skin: Negative for flushing, nail changes, rash and suspicious lesions.  ?Musculoskeletal: Negative for arthritis, joint pain, muscle cramps, myalgias, neck pain and stiffness.  ?Gastrointestinal: Negative for abdominal pain, bowel incontinence, diarrhea and excessive appetite.  ?Genitourinary: Negative for decreased libido, genital sores and incomplete emptying.  ?Neurological: Negative for brief paralysis, focal weakness, headaches and loss of balance.  ?Psychiatric/Behavioral: Negative for altered mental status, depression and suicidal ideas.  ?Allergic/Immunologic: Negative for HIV exposure and persistent infections.  ? ? ?EKGs/Labs/Other Studies Reviewed:   ? ?The following studies  were reviewed today: ? ? ?EKG: None today ? ?Renal US 05/21/2021 Summary:  ?Renal:  ?   ?Right: Patent renal artery without significant stenosis. Abnormal  ?       right Resistive Index. Normal size right kidney.  ?Left:  Patent renal artery without significant stenosis. Abnormal  ?       left Resisitve Index. Normal size of left kidney.  ?   ?*See table(s) above for measurements and observations.  ?   ?Diagnosing physician: Deitra Mayo MD  ?   ?Electronically signed by Deitra Mayo MD on 05/21/2021 at 1:54:52  ?PM.  ? ?[place] 05/31/2021 ?  Ao sat 90%, PA sat 62%, PA pressure 83/37, mean PA pressure 54 mm Hg; mean PCWP 38 mm Hg; prominent V waves on wedge tracing; CO 7.9 L/min; CI 3.6. ?  Severe systemic hypertension. ?  Hemodynamic findings consistent with severe pulmonary hypertension. ?  ?He still appears quite volume overloaded.  Prominent V wave on wedge tracing may represent restrictive physiology. ?Continue medical therapy including volume removal with dialysis.  ?  ? ?TTE 05/18/2021 IMPRESSIONS  ? 1. Left ventricular ejection fraction, by estimation, is 70 to 75%. The left ventricle has hyperdynamic function. The left ventricle has no  ?regional wall motion  abnormalities. There is mild concentric left ventricular hypertrophy. Left ventricular  ?diastolic parameters are indeterminate. Elevated left ventricular end-diastolic pressure. The average left ventricular

## 2021-08-12 ENCOUNTER — Ambulatory Visit (HOSPITAL_BASED_OUTPATIENT_CLINIC_OR_DEPARTMENT_OTHER): Payer: Medicaid Other | Attending: Cardiology | Admitting: Cardiovascular Disease

## 2021-08-23 ENCOUNTER — Ambulatory Visit (HOSPITAL_BASED_OUTPATIENT_CLINIC_OR_DEPARTMENT_OTHER): Payer: Medicaid Other | Admitting: Cardiovascular Disease

## 2021-08-28 ENCOUNTER — Ambulatory Visit (HOSPITAL_BASED_OUTPATIENT_CLINIC_OR_DEPARTMENT_OTHER): Payer: Medicaid Other | Admitting: Cardiovascular Disease

## 2021-09-13 ENCOUNTER — Other Ambulatory Visit: Payer: Self-pay | Admitting: Cardiovascular Disease

## 2021-10-09 ENCOUNTER — Ambulatory Visit: Payer: Medicaid Other | Admitting: Cardiology

## 2021-10-14 ENCOUNTER — Encounter: Payer: Self-pay | Admitting: Cardiology

## 2021-11-05 ENCOUNTER — Telehealth: Payer: Self-pay | Admitting: Cardiology

## 2021-11-05 NOTE — Telephone Encounter (Signed)
Tried to call pt, no voicemail unable to leave voice mail. Will have to call again later.

## 2021-11-05 NOTE — Telephone Encounter (Signed)
Pt c/o swelling: STAT is pt has developed SOB within 24 hours  If swelling, where is the swelling located? legs  How much weight have you gained and in what time span? no  Have you gained 3 pounds in a day or 5 pounds in a week? no  Do you have a log of your daily weights (if so, list)? no  Are you currently taking a fluid pill? Yes   Are you currently SOB? No   Have you traveled recently? No  Pt wife called stating pt needed to schedule a f/u and per his last visit he was told to come back in 3 months. Pt is still swelling in his legs and he is currently taking a fluid pill. Pt made an appt with [facility] APP for 11/11/21 at 10:15

## 2021-11-06 NOTE — Telephone Encounter (Signed)
Unable to reach pt or leave a message  

## 2021-11-06 NOTE — Progress Notes (Deleted)
Office Visit    Patient Name: Micheal Levy Date of Encounter: 11/06/2021  Primary Care Provider:  Barbette Merino, NP Primary Cardiologist:  Thomasene Ripple, DO Primary Electrophysiologist: None  Chief Complaint    Micheal Levy is a 41 y.o. male with PMH of chronic diastolic CHF (EF 76-54%, G3 DD by TTE 01/2021), history of CVA, substance abuse (cocaine and marijuana), CKD stage IIIa, IDT2DM, HTN, HLD, pulmonary HTN, CKD stage III who presents today for increased swelling in lower extremities. Past Medical History    Past Medical History:  Diagnosis Date   Chronic kidney disease    Diabetes mellitus without complication ([facility])    Elevated serum cholesterol 04/2019   Elevated serum creatinine 04/2019   Hypertension    Hypertensive urgency 04/2019   Seizures ([facility]) 03/2017   x 2    Stroke ([facility]) 03/2017   Substance abuse ([facility])    Vitamin D deficiency 04/2019   Past Surgical History:  Procedure Laterality Date   RIGHT HEART CATH N/A 05/31/2021   Procedure: RIGHT HEART CATH;  Surgeon: Corky Crafts, MD;  Location: [facility] INVASIVE CV LAB;  Service: Cardiovascular;  Laterality: N/A;    Allergies  No Known Allergies  History of Present Illness    Micheal Levy is a 41 year old male with the above-mentioned past medical history who presents today for follow-up of increased lower extremity swelling.  He was initially seen by Dr.[name] in November 2022 following admission for uncontrolled hypertension and CHF exacerbation.  He was noted during follow-up to have elevated blood pressures of 200/110.  Patient had brought his medications to appointment and Dr.[name] instructed him to take and blood pressures improved to 160/90.  He was started on valsartan for additional blood pressure support.  He was advised to follow-up in the advanced heart failure clinic however did not show for follow-up.    He presented to the ED on 05/08/2021 for complaint of SOB.  He was found to have acute on  chronic diastolic CHF with AKI.  TTE completed with EF of 70-75% with hyperdynamic LV function, no RWMA with mild LVH and elevated LV end-diastolic pressure, and moderately elevated pulmonary artery systolic pressure of 45.2 mmHg, no MV stenosis or AV regurg present. He was treated with IV Lasix with respiratory improvement and was given referral to nephrology following discharge.  He was seen 10 days later back in the emergency room for shortness of breath and was treated for asthma exacerbation by EMS.  He was also found to be hypertensive with BP of 180/100.  He was admitted to critical care and treated with nicardipine drip and nitroglycerin.  Patient underwent RHC that revealed severe pulmonary hypertension. He had fluid removal with CRRT and was placed on IV Lasix response.  He was transitioned to oral torsemide 80 mg daily.  He was taken off all renal toxic BP medications.  He was discharged and instructed to follow-up with cardiology.  During follow-up patient.  To still be volume overloaded and was encouraged to continue torsemide and was arranged for sleep study to determine culprit of PHT diagnosis.  He was also referred to advanced heart failure clinic at that time.  He was instructed to follow-up in 12 weeks.  Chronic diastolic CHF Since last being seen in the office patient reports***.  Patient denies chest pain, palpitations, dyspnea, PND, orthopnea, nausea, vomiting, dizziness, syncope, edema, weight gain, or early satiety.     ***Notes: Current GDMT consist of carvedilol 25  mg twice daily, hydralazine 100 mg twice daily, Imdur 120 mg daily, Demadex 20 mg 4 times daily May consider Jardiance -Referred to advanced heart failure clinic in March at follow-up -If blood pressures are elevated could consider minoxidil -May replace Norvasc with spironolactone if creatinine better otherwise will avoid nephrotoxic medications -Didi you follow up with nephrology Home Medications    Current  Outpatient Medications  Medication Sig Dispense Refill   acetaminophen (TYLENOL) 325 MG tablet Take 2 tablets (650 mg total) by mouth every 4 (four) hours as needed for headache or mild pain.     albuterol (VENTOLIN HFA) 108 (90 Base) MCG/ACT inhaler Inhale 2 puffs into the lungs every 6 (six) hours as needed for wheezing or shortness of breath. 8 g 12   amLODipine (NORVASC) 10 MG tablet Take 1 tablet (10 mg total) by mouth daily. 30 tablet 1   aspirin EC 81 MG tablet Take 1 tablet (81 mg total) by mouth daily. 30 tablet 0   Blood Glucose Monitoring Suppl (TRUE METRIX AIR GLUCOSE METER) DEVI 1 each by Does not apply route 4 (four) times daily -  before meals and at bedtime. 1 each 0   carvedilol (COREG) 25 MG tablet Take 1 tablet (25 mg total) by mouth 2 (two) times daily with a meal. 60 tablet 1   cloNIDine (CATAPRES) 0.3 MG tablet Take 1 tablet (0.3 mg total) by mouth 3 (three) times daily. 90 tablet 1   glipiZIDE (GLUCOTROL XL) 10 MG 24 hr tablet Take 1 tablet (10 mg total) by mouth daily with breakfast. 30 tablet 1   glucose blood (TRUE METRIX BLOOD GLUCOSE TEST) test strip Use as instructed (Patient taking differently: 1 each by Other route as directed.) 100 each 12   hydrALAZINE (APRESOLINE) 100 MG tablet Take 1 tablet (100 mg total) by mouth every 8 (eight) hours. 270 tablet 3   insulin glargine (LANTUS SOLOSTAR) 100 UNIT/ML Solostar Pen Inject 5 Units into the skin daily at 10 pm. 15 mL 0   Insulin Pen Needle (PEN NEEDLES) 31G X 6 MM MISC 1 each by Does not apply route at bedtime. 100 each 12   INSULIN SYRINGE .5CC/29G (B-D INS SYR ULTRAFINE .5CC/29G) 29G X 1/2" 0.5 ML MISC 1 each by Does not apply route at bedtime. 100 each 5   isosorbide mononitrate (IMDUR) 120 MG 24 hr tablet Take 1 tablet (120 mg total) by mouth daily. 30 tablet 1   Lancets MISC 1 each by Does not apply route 4 (four) times daily -  before meals and at bedtime. 100 each 5   pantoprazole (PROTONIX) 40 MG tablet Take 1  tablet (40 mg total) by mouth at bedtime. 30 tablet 1   torsemide (DEMADEX) 20 MG tablet TAKE 4 TABLETS (80 MG TOTAL) BY MOUTH ONCE FOR 1 DOSE. 360 tablet 1   No current facility-administered medications for this visit.     Review of Systems  Please see the history of present illness.    (+)*** (+)***  All other systems reviewed and are otherwise negative except as noted above.  Physical Exam    Wt Readings from Last 3 Encounters:  06/24/21 237 lb (107.5 kg)  06/08/21 225 lb 4.8 oz (102.2 kg)  04/22/21 227 lb 15.3 oz (103.4 kg)   HY:QMVHQ were no vitals filed for this visit.,There is no height or weight on file to calculate BMI.  Constitutional:      Appearance: Healthy appearance. Not in distress.  Neck:  Vascular: JVD normal.  Pulmonary:     Effort: Pulmonary effort is normal.     Breath sounds: No wheezing. No rales. Diminished in the bases Cardiovascular:     Normal rate. Regular rhythm. Normal S1. Normal S2.      Murmurs: There is no murmur.  Edema:    Peripheral edema absent.  Abdominal:     Palpations: Abdomen is soft non tender. There is no hepatomegaly.  Skin:    General: Skin is warm and dry.  Neurological:     General: No focal deficit present.     Mental Status: Alert and oriented to person, place and time.     Cranial Nerves: Cranial nerves are intact.  EKG/LABS/Other Studies Reviewed    ECG personally reviewed by me today - ***  Risk Assessment/Calculations:   {Does this patient have ATRIAL FIBRILLATION?[phone]}        Lab Results  Component Value Date   WBC 7.7 06/08/2021   HGB 10.0 (L) 06/08/2021   HCT 31.9 (L) 06/08/2021   MCV 83.9 06/08/2021   PLT 232 06/08/2021   Lab Results  Component Value Date   CREATININE 2.45 (H) 06/24/2021   BUN 30 (H) 06/24/2021   NA 140 06/24/2021   K 3.9 06/24/2021   CL 101 06/24/2021   CO2 27 06/24/2021   Lab Results  Component Value Date   ALT 25 06/05/2021   AST 19 06/05/2021   ALKPHOS 62  06/05/2021   BILITOT 0.5 06/05/2021   Lab Results  Component Value Date   CHOL 91 01/15/2021   HDL 28 (L) 01/15/2021   LDLCALC 47 01/15/2021   TRIG 81 01/15/2021   CHOLHDL 3.3 01/15/2021    Lab Results  Component Value Date   HGBA1C 6.6 (H) 05/21/2021    Assessment & Plan    1.  Chronic diastolic CHF/restrictive cardiomyopathy: -Last 2D echo was 05/2021 with hyperdynamic EF of 70-75% and RV systolic pressure of 45.2 -Current GDMT consist of carvedilol 25 mg twice daily, hydralazine 100 mg twice daily, Imdur 120 mg daily, Demadex 20 mg 4 times daily   2.  Hypertensive urgency: -Patient's blood pressure today was  3. Pulmonary hypertension: -WHO group 2 with right atrial pressure noted at 15 mmHg and RV systolic pressure of 45.2   4.  Stage IIIb CKD: -Followed by?  5.  DM type II: -Hemoglobin A1c was 6.6 months ago -Continue current antidiabetic regimen per PCP    6.  Morbid obesity: -Patient's current BMI is 36.04 kg/m    Disposition: Follow-up with Kardie [name], DO or APP in *** months {Are you ordering a CV Procedure (e.g. stress test, cath, DCCV, TEE, etc)?   Press F2        :[id number]}   Medication Adjustments/Labs and Tests Ordered: Current medicines are reviewed at length with the patient today.  Concerns regarding medicines are outlined above.   Signed, Napoleon Form, Leodis Rains, NP 11/06/2021, 10:51 AM [facility]

## 2021-11-11 ENCOUNTER — Ambulatory Visit: Payer: Medicaid Other | Admitting: Nurse Practitioner

## 2021-11-18 NOTE — Telephone Encounter (Signed)
Pt was scheduled for appointment w APP 8/7 but did not show for appointment.

## 2021-12-17 ENCOUNTER — Encounter (HOSPITAL_COMMUNITY): Payer: Medicaid Other | Admitting: Cardiology

## 2021-12-22 ENCOUNTER — Encounter (HOSPITAL_COMMUNITY): Payer: Self-pay

## 2021-12-22 ENCOUNTER — Inpatient Hospital Stay (HOSPITAL_COMMUNITY)
Admission: EM | Admit: 2021-12-22 | Discharge: 2021-12-25 | DRG: 291 | Disposition: A | Discharge status: Home / Self Care | Payer: Medicaid Other | Attending: Internal Medicine | Admitting: Internal Medicine

## 2021-12-22 ENCOUNTER — Emergency Department (HOSPITAL_COMMUNITY): Payer: Medicaid Other

## 2021-12-22 ENCOUNTER — Other Ambulatory Visit: Payer: Self-pay

## 2021-12-22 DIAGNOSIS — Z7984 Long term (current) use of oral hypoglycemic drugs: Secondary | ICD-10-CM

## 2021-12-22 DIAGNOSIS — Z6836 Body mass index (BMI) 36.0-36.9, adult: Secondary | ICD-10-CM

## 2021-12-22 DIAGNOSIS — Z8673 Personal history of transient ischemic attack (TIA), and cerebral infarction without residual deficits: Secondary | ICD-10-CM

## 2021-12-22 DIAGNOSIS — Z8249 Family history of ischemic heart disease and other diseases of the circulatory system: Secondary | ICD-10-CM

## 2021-12-22 DIAGNOSIS — E669 Obesity, unspecified: Secondary | ICD-10-CM | POA: Diagnosis present

## 2021-12-22 DIAGNOSIS — Z7982 Long term (current) use of aspirin: Secondary | ICD-10-CM

## 2021-12-22 DIAGNOSIS — N184 Chronic kidney disease, stage 4 (severe): Secondary | ICD-10-CM | POA: Diagnosis present

## 2021-12-22 DIAGNOSIS — E1122 Type 2 diabetes mellitus with diabetic chronic kidney disease: Secondary | ICD-10-CM | POA: Diagnosis present

## 2021-12-22 DIAGNOSIS — E1169 Type 2 diabetes mellitus with other specified complication: Secondary | ICD-10-CM | POA: Diagnosis present

## 2021-12-22 DIAGNOSIS — I5033 Acute on chronic diastolic (congestive) heart failure: Secondary | ICD-10-CM | POA: Diagnosis not present

## 2021-12-22 DIAGNOSIS — E876 Hypokalemia: Secondary | ICD-10-CM | POA: Diagnosis present

## 2021-12-22 DIAGNOSIS — I272 Pulmonary hypertension, unspecified: Secondary | ICD-10-CM | POA: Diagnosis present

## 2021-12-22 DIAGNOSIS — I130 Hypertensive heart and chronic kidney disease with heart failure and stage 1 through stage 4 chronic kidney disease, or unspecified chronic kidney disease: Principal | ICD-10-CM | POA: Diagnosis present

## 2021-12-22 DIAGNOSIS — Z87891 Personal history of nicotine dependence: Secondary | ICD-10-CM

## 2021-12-22 DIAGNOSIS — Z79899 Other long term (current) drug therapy: Secondary | ICD-10-CM

## 2021-12-22 DIAGNOSIS — I152 Hypertension secondary to endocrine disorders: Secondary | ICD-10-CM | POA: Diagnosis present

## 2021-12-22 DIAGNOSIS — Z91148 Patient's other noncompliance with medication regimen for other reason: Secondary | ICD-10-CM

## 2021-12-22 DIAGNOSIS — N1832 Chronic kidney disease, stage 3b: Secondary | ICD-10-CM | POA: Diagnosis present

## 2021-12-22 DIAGNOSIS — Z833 Family history of diabetes mellitus: Secondary | ICD-10-CM

## 2021-12-22 DIAGNOSIS — Z794 Long term (current) use of insulin: Secondary | ICD-10-CM

## 2021-12-22 DIAGNOSIS — E119 Type 2 diabetes mellitus without complications: Secondary | ICD-10-CM

## 2021-12-22 DIAGNOSIS — R601 Generalized edema: Principal | ICD-10-CM

## 2021-12-22 DIAGNOSIS — E1159 Type 2 diabetes mellitus with other circulatory complications: Secondary | ICD-10-CM | POA: Diagnosis present

## 2021-12-22 LAB — CBC WITH DIFFERENTIAL/PLATELET
Abs Immature Granulocytes: 0.02 10*3/uL (ref 0.00–0.07)
Basophils Absolute: 0 10*3/uL (ref 0.0–0.1)
Basophils Relative: 1 %
Eosinophils Absolute: 0.1 10*3/uL (ref 0.0–0.5)
Eosinophils Relative: 1 %
HCT: 37.9 % — ABNORMAL LOW (ref 39.0–52.0)
Hemoglobin: 11.2 g/dL — ABNORMAL LOW (ref 13.0–17.0)
Immature Granulocytes: 0 %
Lymphocytes Relative: 20 %
Lymphs Abs: 1.5 10*3/uL (ref 0.7–4.0)
MCH: 23.6 pg — ABNORMAL LOW (ref 26.0–34.0)
MCHC: 29.6 g/dL — ABNORMAL LOW (ref 30.0–36.0)
MCV: 80 fL (ref 80.0–100.0)
Monocytes Absolute: 0.7 10*3/uL (ref 0.1–1.0)
Monocytes Relative: 9 %
Neutro Abs: 4.9 10*3/uL (ref 1.7–7.7)
Neutrophils Relative %: 69 %
Platelets: 354 10*3/uL (ref 150–400)
RBC: 4.74 MIL/uL (ref 4.22–5.81)
RDW: 16.4 % — ABNORMAL HIGH (ref 11.5–15.5)
WBC: 7.2 10*3/uL (ref 4.0–10.5)
nRBC: 0 % (ref 0.0–0.2)

## 2021-12-22 LAB — BASIC METABOLIC PANEL
Anion gap: 6 (ref 5–15)
BUN: 22 mg/dL — ABNORMAL HIGH (ref 6–20)
CO2: 27 mmol/L (ref 22–32)
Calcium: 9.4 mg/dL (ref 8.9–10.3)
Chloride: 110 mmol/L (ref 98–111)
Creatinine, Ser: 2.52 mg/dL — ABNORMAL HIGH (ref 0.61–1.24)
GFR, Estimated: 32 mL/min — ABNORMAL LOW (ref >60)
Glucose, Bld: 169 mg/dL — ABNORMAL HIGH (ref 70–99)
Potassium: 3.1 mmol/L — ABNORMAL LOW (ref 3.5–5.1)
Sodium: 143 mmol/L (ref 135–145)

## 2021-12-22 LAB — BRAIN NATRIURETIC PEPTIDE: B Natriuretic Peptide: 648.6 pg/mL — ABNORMAL HIGH (ref 0.0–100.0)

## 2021-12-22 MED ORDER — AMLODIPINE BESYLATE 5 MG PO TABS
10.0000 mg | ORAL_TABLET | Freq: Every day | ORAL | Status: DC
  Administered 2021-12-23 – 2021-12-25 (×3): 10 mg via ORAL
  Filled 2021-12-22 (×3): qty 2

## 2021-12-22 MED ORDER — HYDRALAZINE HCL 50 MG PO TABS
100.0000 mg | ORAL_TABLET | Freq: Three times a day (TID) | ORAL | Status: DC
  Administered 2021-12-23 – 2021-12-25 (×7): 100 mg via ORAL
  Filled 2021-12-22 (×7): qty 2

## 2021-12-22 MED ORDER — ISOSORBIDE MONONITRATE ER 60 MG PO TB24
120.0000 mg | ORAL_TABLET | Freq: Every day | ORAL | Status: DC
  Administered 2021-12-23 – 2021-12-25 (×3): 120 mg via ORAL
  Filled 2021-12-22 (×3): qty 2

## 2021-12-22 MED ORDER — HEPARIN SODIUM (PORCINE) 5000 UNIT/ML IJ SOLN
5000.0000 [IU] | Freq: Three times a day (TID) | INTRAMUSCULAR | Status: DC
  Administered 2021-12-23 – 2021-12-25 (×8): 5000 [IU] via SUBCUTANEOUS
  Filled 2021-12-22 (×8): qty 1

## 2021-12-22 MED ORDER — ACETAMINOPHEN 650 MG RE SUPP: 650.0000 mg | Freq: Four times a day (QID) | RECTAL | Status: DC | PRN

## 2021-12-22 MED ORDER — SENNOSIDES-DOCUSATE SODIUM 8.6-50 MG PO TABS: 1.0000 | ORAL_TABLET | Freq: Every evening | ORAL | Status: DC | PRN

## 2021-12-22 MED ORDER — INSULIN ASPART 100 UNIT/ML IJ SOLN
0.0000 [IU] | Freq: Three times a day (TID) | INTRAMUSCULAR | Status: DC
  Administered 2021-12-23 – 2021-12-24 (×4): 2 [IU] via SUBCUTANEOUS
  Filled 2021-12-22: qty 0.09

## 2021-12-22 MED ORDER — ACETAMINOPHEN 325 MG PO TABS: 650.0000 mg | ORAL_TABLET | Freq: Four times a day (QID) | ORAL | Status: DC | PRN

## 2021-12-22 MED ORDER — FUROSEMIDE 10 MG/ML IJ SOLN
60.0000 mg | Freq: Two times a day (BID) | INTRAMUSCULAR | Status: DC
  Administered 2021-12-23 – 2021-12-25 (×5): 60 mg via INTRAVENOUS
  Filled 2021-12-22 (×5): qty 6

## 2021-12-22 MED ORDER — ONDANSETRON HCL 4 MG PO TABS: 4.0000 mg | ORAL_TABLET | Freq: Four times a day (QID) | ORAL | Status: DC | PRN

## 2021-12-22 MED ORDER — CLONIDINE HCL 0.1 MG PO TABS
0.3000 mg | ORAL_TABLET | Freq: Three times a day (TID) | ORAL | Status: DC
  Administered 2021-12-23 – 2021-12-25 (×7): 0.3 mg via ORAL
  Filled 2021-12-22 (×7): qty 3

## 2021-12-22 MED ORDER — POTASSIUM CHLORIDE 20 MEQ PO PACK
40.0000 meq | PACK | Freq: Once | ORAL | Status: AC
  Administered 2021-12-23: 40 meq via ORAL
  Filled 2021-12-22: qty 2

## 2021-12-22 MED ORDER — INSULIN GLARGINE-YFGN 100 UNIT/ML ~~LOC~~ SOLN
5.0000 [IU] | Freq: Every day | SUBCUTANEOUS | Status: DC
  Administered 2021-12-23 – 2021-12-24 (×2): 5 [IU] via SUBCUTANEOUS
  Filled 2021-12-22 (×3): qty 0.05

## 2021-12-22 MED ORDER — FUROSEMIDE 10 MG/ML IJ SOLN
40.0000 mg | Freq: Once | INTRAMUSCULAR | Status: AC
  Administered 2021-12-22: 40 mg via INTRAVENOUS
  Filled 2021-12-22: qty 4

## 2021-12-22 MED ORDER — CARVEDILOL 12.5 MG PO TABS
25.0000 mg | ORAL_TABLET | Freq: Two times a day (BID) | ORAL | Status: DC
  Administered 2021-12-23 – 2021-12-25 (×5): 25 mg via ORAL
  Filled 2021-12-22 (×5): qty 2

## 2021-12-22 MED ORDER — ONDANSETRON HCL 4 MG/2ML IJ SOLN: 4.0000 mg | Freq: Four times a day (QID) | INTRAMUSCULAR | Status: DC | PRN

## 2021-12-22 NOTE — Assessment & Plan Note (Signed)
Presenting with progressively worsening peripheral edema, massive volume overload.  Denies significant dyspnea and is saturating well on room air.  BNP 648.6.  Not sure accuracy of weights but he is up to 255 lbs compared to 236 lbs 6 months ago. -Continue IV Lasix 60 mg twice daily -Monitor strict I/O's and daily weights -Continue Coreg 25 mg BID

## 2021-12-22 NOTE — Assessment & Plan Note (Signed)
Oral supplement given.  Check magnesium and replete if needed.

## 2021-12-22 NOTE — Hospital Course (Signed)
Micheal Levy is a 41 y.o. male with medical history significant for HFpEF (last EF 70-75% by TTE 05/18/2021), severe pHTN, CKD stage IIIb, insulin-dependent T2DM, HTN, history of watershed CVA, morbid obesity, substance use who is admitted with acute on chronic HFpEF.

## 2021-12-22 NOTE — Assessment & Plan Note (Signed)
Renal function stable and improved from previous, monitor closely with diuresis.  Will hold losartan at this time.

## 2021-12-22 NOTE — H&P (Signed)
History and Physical    Micheal Levy ZWC:[id number] DOB: 1980-06-13 DOA: 12/22/2021  PCP: [name], NP  Patient coming from: Home  I have personally briefly reviewed patient's old medical records in [place]  Chief Complaint: Edema  HPI: Micheal Levy is a 41 y.o. male with medical history significant for HFpEF (last EF 70-75% by TTE 05/18/2021), severe pHTN, CKD stage IIIb, insulin-dependent T2DM, HTN, history of watershed CVA, morbid obesity, substance use who presented to the ED for evaluation of worsening edema.  Patient reports progressively worsening peripheral edema over the last week.  Edema has been moving up from his legs to his umbilicus.  He reports adherence to torsemide 80 mg daily with decent urine output.  He reports new cough intermittently productive of clear/white sputum.  Has not really had any dyspnea.  He says he ran out of his antihypertensive medications for about a week and a half but had them refilled by his nephrologist recently and is back on his usual medications.  ED Course  Labs/Imaging on admission: I have personally reviewed following labs and imaging studies.  Initial vitals showed BP 179/106, pulse 80, RR 18, temp 98.2 F, SPO2 98% on room air.  Labs show WBC 7.2, hemoglobin 11.2, platelets 354,000, sodium 143, potassium 3.1, bicarb 27, BUN 22, creatinine 2.52, serum glucose 169, BNP 648.6.  Portable chest x-ray negative for focal consolidation, edema, effusion.  Stable cardiomegaly noted.  Patient was given IV Lasix 40 mg.  The hospitalist service was consulted to admit for further evaluation and management.  Review of Systems: All systems reviewed and are negative except as documented in history of present illness above.   Past Medical History:  Diagnosis Date   Chronic kidney disease    Diabetes mellitus without complication (HCC)    Elevated serum cholesterol 04/2019   Elevated serum creatinine 04/2019   Hypertension     Hypertensive urgency 04/2019   Seizures ([place]) 03/2017   x 2    Stroke ([place]) 03/2017   Substance abuse ([place])    Vitamin D deficiency 04/2019    Past Surgical History:  Procedure Laterality Date   RIGHT HEART CATH N/A 05/31/2021   Procedure: RIGHT HEART CATH;  Surgeon: Jettie Booze, MD;  Location: [facility];  Service: Cardiovascular;  Laterality: N/A;    Social History:  reports that he quit smoking about 22 years ago. His smoking use included cigarettes. He has never used smokeless tobacco. He reports current alcohol use. He reports current drug use. Drugs: Cocaine and Marijuana.  No Known Allergies  Family History  Problem Relation Age of Onset   Diabetes Father    Heart attack Father    Diabetes Sister    Hypertension Brother      Prior to Admission medications   Medication Sig Start Date End Date Taking? Authorizing Provider  acetaminophen (TYLENOL) 325 MG tablet Take 2 tablets (650 mg total) by mouth every 4 (four) hours as needed for headache or mild pain. 06/08/21   Eugenie Filler, MD  albuterol (VENTOLIN HFA) 108 (90 Base) MCG/ACT inhaler Inhale 2 puffs into the lungs every 6 (six) hours as needed for wheezing or shortness of breath. 04/19/19   Azzie Glatter, FNP  amLODipine (NORVASC) 10 MG tablet Take 1 tablet (10 mg total) by mouth daily. 06/08/21 08/07/21  Eugenie Filler, MD  aspirin EC 81 MG tablet Take 1 tablet (81 mg total) by mouth daily. 06/08/21   [place] Silos,  Malachy Moan, MD  Blood Glucose Monitoring Suppl (TRUE METRIX AIR GLUCOSE METER) DEVI 1 each by Does not apply route 4 (four) times daily -  before meals and at bedtime. 04/19/19   Azzie Glatter, FNP  carvedilol (COREG) 25 MG tablet Take 1 tablet (25 mg total) by mouth 2 (two) times daily with a meal. 06/08/21 08/07/21  Eugenie Filler, MD  cloNIDine (CATAPRES) 0.3 MG tablet Take 1 tablet (0.3 mg total) by mouth 3 (three) times daily. 06/08/21   Eugenie Filler, MD  glipiZIDE (GLUCOTROL XL) 10  MG 24 hr tablet Take 1 tablet (10 mg total) by mouth daily with breakfast. 06/08/21   Eugenie Filler, MD  glucose blood (TRUE METRIX BLOOD GLUCOSE TEST) test strip Use as instructed Patient taking differently: 1 each by Other route as directed. 04/19/19   Azzie Glatter, FNP  hydrALAZINE (APRESOLINE) 100 MG tablet Take 1 tablet (100 mg total) by mouth every 8 (eight) hours. 06/24/21   [name], Kardie, DO  insulin glargine (LANTUS SOLOSTAR) 100 UNIT/ML Solostar Pen Inject 5 Units into the skin daily at 10 pm. 06/08/21 06/08/22  Eugenie Filler, MD  Insulin Pen Needle (PEN NEEDLES) 31G X 6 MM MISC 1 each by Does not apply route at bedtime. 10/05/17   Dorena Dew, FNP  INSULIN SYRINGE .5CC/29G (B-D INS SYR ULTRAFINE .5CC/29G) 29G X 1/2" 0.5 ML MISC 1 each by Does not apply route at bedtime. 04/02/17   Dorena Dew, FNP  isosorbide mononitrate (IMDUR) 120 MG 24 hr tablet Take 1 tablet (120 mg total) by mouth daily. 06/09/21   Eugenie Filler, MD  Lancets MISC 1 each by Does not apply route 4 (four) times daily -  before meals and at bedtime. 04/19/19   Azzie Glatter, FNP  pantoprazole (PROTONIX) 40 MG tablet Take 1 tablet (40 mg total) by mouth at bedtime. 06/08/21   Eugenie Filler, MD  torsemide (DEMADEX) 20 MG tablet TAKE 4 TABLETS (80 MG TOTAL) BY MOUTH ONCE FOR 1 DOSE. 09/13/21 09/13/21  [name], Dani Gobble, MD    Physical Exam: Vitals:   12/22/21 2047 12/22/21 2051 12/22/21 2200 12/22/21 2300  BP: (!) 179/106  (!) 170/102 (!) 166/94  Pulse: 80  77 70  Resp: 18  17 (!) 24  Temp: 98.2 F (36.8 C)     TempSrc: Oral     SpO2: 98%  94% 96%  Weight:  116.1 kg    Height:  5\' 9"  (1.753 m)     Constitutional: Resting in bed with head elevated NAD, calm, comfortable Eyes: EOMI, lids and conjunctivae normal ENMT: Mucous membranes are moist. Posterior pharynx clear of any exudate or lesions.Normal dentition.  Neck: normal, supple, no masses. Respiratory: clear to auscultation bilaterally, no  wheezing, no crackles. Normal respiratory effort. No accessory muscle use.  Cardiovascular: Regular rate and rhythm, no murmurs / rubs / gallops.  +3 bilateral lower extremity edema. 2+ pedal pulses. Abdomen: Mild distention, soft, no tenderness, no masses palpated.  Musculoskeletal: no clubbing / cyanosis. No joint deformity upper and lower extremities. Good ROM, no contractures. Normal muscle tone.  Skin: no rashes, lesions, ulcers. No induration Neurologic: Sensation intact. Strength 5/5 in all 4.  Psychiatric:  Alert and oriented x 3. Normal mood.   EKG: Personally reviewed. Sinus rhythm, T wave inversion in V6, motion artifact.  Similar to prior.  Assessment/Plan Principal Problem:   Acute on chronic heart failure with preserved ejection fraction (HFpEF) (HCC) Active  Problems:   Chronic kidney disease, stage 3b ([place])   Hypertension associated with diabetes ([place])   Hypokalemia   Insulin-requiring or dependent type II diabetes mellitus ([place])   Micheal Levy is a 41 y.o. male with medical history significant for HFpEF (last EF 70-75% by TTE 05/18/2021), severe pHTN, CKD stage IIIb, insulin-dependent T2DM, HTN, history of watershed CVA, morbid obesity, substance use who is admitted with acute on chronic HFpEF.  Assessment and Plan: * Acute on chronic heart failure with preserved ejection fraction (HFpEF) ([place]) Presenting with progressively worsening peripheral edema, massive volume overload.  Denies significant dyspnea and is saturating well on room air.  BNP 648.6.  Not sure accuracy of weights but he is up to 255 lbs compared to 236 lbs 6 months ago. -Continue IV Lasix 60 mg twice daily -Monitor strict I/O's and daily weights -Continue Coreg 25 mg BID  Chronic kidney disease, stage 3b ([place]) Renal function stable and improved from previous, monitor closely with diuresis.  Will hold losartan at this time.  Hypertension associated with diabetes ([place]) -Continue amlodipine 10 mg  daily -Continue Coreg 25 mg twice daily -Continue clonidine 0.3 mg 3 times daily -Continue hydralazine 100 mg 3 times daily -Continue Imdur 120 mg daily  Hypokalemia Oral supplement given.  Check magnesium and replete if needed.  Insulin-requiring or dependent type II diabetes mellitus (HCC) Continue Semglee 5 units nightly plus SSI.  DVT prophylaxis: heparin injection 5,000 Units Start: 12/22/21 2345 Code Status: Full code, confirmed with patient on admission Family Communication: Discussed with patient, he has discussed with family Disposition Plan: From home and likely discharge to home pending clinical progress Consults called: None Severity of Illness: The appropriate patient status for this patient is OBSERVATION. Observation status is judged to be reasonable and necessary in order to provide the required intensity of service to ensure the patient's safety. The patient's presenting symptoms, physical exam findings, and initial radiographic and laboratory data in the context of their medical condition is felt to place them at decreased risk for further clinical deterioration. Furthermore, it is anticipated that the patient will be medically stable for discharge from the hospital within 2 midnights of admission.   Zada Finders MD [facility]  If 7PM-7AM, please contact night-coverage [email or web address]  12/22/2021, 11:52 PM

## 2021-12-22 NOTE — Assessment & Plan Note (Signed)
-  Continue amlodipine 10 mg daily -Continue Coreg 25 mg twice daily -Continue clonidine 0.3 mg 3 times daily -Continue hydralazine 100 mg 3 times daily -Continue Imdur 120 mg daily

## 2021-12-22 NOTE — ED Notes (Signed)
ED TO INPATIENT HANDOFF REPORT  Name/Age/Gender Micheal Levy 41 y.o. male  Code Status Code Status History     Date Active Date Inactive Code Status Order ID Comments User Context   05/18/2021 0323 06/08/2021 2209 Full Code [id number]  Brand Males, MD ED   05/08/2021 2038 05/11/2021 2004 Full Code [id number]  Micheal Cordia, MD ED   04/22/2021 1606 04/24/2021 2159 Full Code [id number]  Micheal Milan, MD ED   02/20/2021 0323 02/21/2021 1955 Full Code [id number]  Micheal Leff, MD ED   01/12/2021 0604 01/19/2021 1938 Full Code [id number]  Micheal Bulls, MD ED   09/04/2019 0750 09/13/2019 1941 Full Code [id number]  [name], NP ED   03/21/2017 2213 03/26/2017 1943 Full Code [id number]  Micheal Person, NP ED       Home/SNF/Other Home  Chief Complaint Acute on chronic heart failure with preserved ejection fraction (HFpEF) ([place]) [I50.33]  Level of Care/Admitting Diagnosis ED Disposition     ED Disposition  Admit   Condition  --   Comment  Hospital Area: [facility] [[id number]]  Level of Care: Telemetry [5]  Admit to tele based on following criteria: Acute CHF  May place patient in observation at [facility] or [facility] if equivalent level of care is available:: No  Covid Evaluation: Asymptomatic - no recent exposure (last 10 days) testing not required  Diagnosis: Acute on chronic heart failure with preserved ejection fraction (HFpEF) [facility]) [[id number]]  Admitting Physician: Micheal Levy [[id number]]  Attending Physician: Micheal Levy [[id number]]          Medical History Past Medical History:  Diagnosis Date   Chronic kidney disease    Diabetes mellitus without complication ([place])    Elevated serum cholesterol 04/2019   Elevated serum creatinine 04/2019   Hypertension    Hypertensive urgency 04/2019   Seizures ([place]) 03/2017   x 2    Stroke ([place]) 03/2017   Substance abuse ([place])    Vitamin D deficiency 04/2019    Allergies No  Known Allergies  IV Location/Drains/Wounds Patient Lines/Drains/Airways Status     Active Line/Drains/Airways     Name Placement date Placement time Site Days   Peripheral IV 05/25/21 22 G 1.75" Anterior;Right Forearm 05/25/21  2309  Forearm  211   Peripheral IV 12/22/21 20 G Anterior;Distal;Left;Upper Arm 12/22/21  2207  Arm  less than 1            Labs/Imaging Results for orders placed or performed during the hospital encounter of 12/22/21 (from the past 48 hour(s))  CBC with Differential     Status: Abnormal   Collection Time: 12/22/21 10:08 PM  Result Value Ref Range   WBC 7.2 4.0 - 10.5 K/uL   RBC 4.74 4.22 - 5.81 MIL/uL   Hemoglobin 11.2 (L) 13.0 - 17.0 g/dL   HCT 37.9 (L) 39.0 - 52.0 %   MCV 80.0 80.0 - 100.0 fL   MCH 23.6 (L) 26.0 - 34.0 pg   MCHC 29.6 (L) 30.0 - 36.0 g/dL   RDW 16.4 (H) 11.5 - 15.5 %   Platelets 354 150 - 400 K/uL   nRBC 0.0 0.0 - 0.2 %   Neutrophils Relative % 69 %   Neutro Abs 4.9 1.7 - 7.7 K/uL   Lymphocytes Relative 20 %   Lymphs Abs 1.5 0.7 - 4.0 K/uL   Monocytes Relative 9 %   Monocytes Absolute 0.7 0.1 - 1.0 K/uL   Eosinophils Relative  1 %   Eosinophils Absolute 0.1 0.0 - 0.5 K/uL   Basophils Relative 1 %   Basophils Absolute 0.0 0.0 - 0.1 K/uL   Immature Granulocytes 0 %   Abs Immature Granulocytes 0.02 0.00 - 0.07 K/uL    Comment: Performed at [facility], [place] [address] [place], [place] [id number]  Basic metabolic panel     Status: Abnormal   Collection Time: 12/22/21 10:08 PM  Result Value Ref Range   Sodium 143 135 - 145 mmol/L   Potassium 3.1 (L) 3.5 - 5.1 mmol/L   Chloride 110 98 - 111 mmol/L   CO2 27 22 - 32 mmol/L   Glucose, Bld 169 (H) 70 - 99 mg/dL    Comment: Glucose reference range applies only to samples taken after fasting for at least 8 hours.   BUN 22 (H) 6 - 20 mg/dL   Creatinine, Ser 2.52 (H) 0.61 - 1.24 mg/dL   Calcium 9.4 8.9 - 10.3 mg/dL   GFR, Estimated 32 (L) >60 mL/min    Comment:  (NOTE) Calculated using the CKD-EPI Creatinine Equation (2021)    Anion gap 6 5 - 15    Comment: Performed at [facility], [place] [address] [place], [place] [address]  Brain natriuretic peptide     Status: Abnormal   Collection Time: 12/22/21 10:08 PM  Result Value Ref Range   B Natriuretic Peptide 648.6 (H) 0.0 - 100.0 pg/mL    Comment: Performed at [facility], [place] [address], [place], [place] [address]   DG Chest Port 1 View  Result Date: 12/22/2021 CLINICAL DATA:  Shortness of breath. EXAM: PORTABLE CHEST 1 VIEW COMPARISON:  Chest x-ray 06/02/2021. FINDINGS: The heart is enlarged, unchanged. The lungs are clear. There is no pleural effusion pneumothorax. No acute fractures are seen. IMPRESSION: 1. No acute cardiopulmonary process. 2. Stable cardiomegaly. Electronically Signed   By: Ronney Asters M.D.   On: 12/22/2021 22:42    Pending Labs Unresulted Labs (From admission, onward)     Start     Ordered   12/22/21 2320  Magnesium  Add-on,   AD        12/22/21 2319            Vitals/Pain Today's Vitals   12/22/21 2047 12/22/21 2051 12/22/21 2200 12/22/21 2300  BP: (!) 179/106  (!) 170/102 (!) 166/94  Pulse: 80  77 70  Resp: 18  17 (!) 24  Temp: 98.2 F (36.8 C)     TempSrc: Oral     SpO2: 98%  94% 96%  Weight:  256 lb (116.1 kg)    Height:  5\' 9"  (1.753 m)    PainSc:  0-No pain      Isolation Precautions No active isolations  Medications Medications  furosemide (LASIX) injection 40 mg (40 mg Intravenous Given 12/22/21 2207)    Mobility walks

## 2021-12-22 NOTE — ED Triage Notes (Signed)
Ambulatory to ED with c/o increased BLE edema. Reports compliance with diuretics. Hx of CHF requiring multiple admissions.

## 2021-12-22 NOTE — ED Provider Notes (Signed)
[facility]-EMERGENCY DEPT Provider Note   CSN: [id number] Arrival date & time: 12/22/21  2038     History  Chief Complaint  Patient presents with   Leg Swelling    Micheal Levy is a 41 y.o. male.  Patient with a history of diabetes hypertension kidney disease and pulmonary hypertension.  He states over the last week he started having significant swelling in both legs coming up to his abdomen.  The history is provided by the patient.  Weakness Severity:  Moderate Onset quality:  Gradual Timing:  Constant Progression:  Worsening Chronicity:  New Context: not alcohol use   Relieved by:  Nothing Worsened by:  Nothing Associated symptoms: no abdominal pain, no chest pain, no cough, no diarrhea, no frequency, no headaches and no seizures        Home Medications Prior to Admission medications   Medication Sig Start Date End Date Taking? Authorizing Provider  acetaminophen (TYLENOL) 325 MG tablet Take 2 tablets (650 mg total) by mouth every 4 (four) hours as needed for headache or mild pain. 06/08/21   [name], MD  albuterol (VENTOLIN HFA) 108 (90 Base) MCG/ACT inhaler Inhale 2 puffs into the lungs every 6 (six) hours as needed for wheezing or shortness of breath. 04/19/19   [name], FNP  amLODipine (NORVASC) 10 MG tablet Take 1 tablet (10 mg total) by mouth daily. 06/08/21 08/07/21  [name], MD  aspirin EC 81 MG tablet Take 1 tablet (81 mg total) by mouth daily. 06/08/21   [name], MD  Blood Glucose Monitoring Suppl (TRUE METRIX AIR GLUCOSE METER) DEVI 1 each by Does not apply route 4 (four) times daily -  before meals and at bedtime. 04/19/19   [name], FNP  carvedilol (COREG) 25 MG tablet Take 1 tablet (25 mg total) by mouth 2 (two) times daily with a meal. 06/08/21 08/07/21  [name], MD  cloNIDine (CATAPRES) 0.3 MG tablet Take 1 tablet (0.3 mg total) by mouth 3 (three) times daily. 06/08/21   [name], MD  glipiZIDE (GLUCOTROL XL) 10 MG 24 hr tablet Take 1 tablet (10 mg total) by mouth daily with breakfast. 06/08/21   [name], MD  glucose blood (TRUE METRIX BLOOD GLUCOSE TEST) test strip Use as instructed Patient taking differently: 1 each by Other route as directed. 04/19/19   [name], FNP  hydrALAZINE (APRESOLINE) 100 MG tablet Take 1 tablet (100 mg total) by mouth every 8 (eight) hours. 06/24/21   [name], Kardie, DO  insulin glargine (LANTUS SOLOSTAR) 100 UNIT/ML Solostar Pen Inject 5 Units into the skin daily at 10 pm. 06/08/21 06/08/22  [name], MD  Insulin Pen Needle (PEN NEEDLES) 31G X 6 MM MISC 1 each by Does not apply route at bedtime. 10/05/17   Massie Maroon, FNP  INSULIN SYRINGE .5CC/29G (B-D INS SYR ULTRAFINE .5CC/29G) 29G X 1/2" 0.5 ML MISC 1 each by Does not apply route at bedtime. 04/02/17   Massie Maroon, FNP  isosorbide mononitrate (IMDUR) 120 MG 24 hr tablet Take 1 tablet (120 mg total) by mouth daily. 06/09/21   [name], MD  Lancets MISC 1 each by Does not apply route 4 (four) times daily -  before meals and at bedtime. 04/19/19   [name], FNP  pantoprazole (PROTONIX) 40 MG tablet Take 1 tablet (40 mg total) by mouth at bedtime. 06/08/21   [name]  V, MD  torsemide (DEMADEX) 20 MG tablet TAKE 4 TABLETS (80 MG TOTAL) BY MOUTH ONCE FOR 1 DOSE. 09/13/21 09/13/21  [name], [name], MD      Allergies    Patient has no known allergies.    Review of Systems   Review of Systems  Constitutional:  Negative for appetite change and fatigue.  HENT:  Negative for congestion, ear discharge and sinus pressure.   Eyes:  Negative for discharge.  Respiratory:  Negative for cough.   Cardiovascular:  Negative for chest pain.  Gastrointestinal:  Negative for abdominal pain and diarrhea.  Genitourinary:  Negative for frequency and hematuria.  Musculoskeletal:  Negative for back pain.       Swelling in both legs  Skin:  Negative for  rash.  Neurological:  Positive for weakness. Negative for seizures and headaches.  Psychiatric/Behavioral:  Negative for hallucinations.     Physical Exam Updated Vital Signs BP (!) 166/94   Pulse 70   Temp 98.2 F (36.8 C) (Oral)   Resp (!) 24   Ht 5\' 9"  (1.753 m)   Wt 116.1 kg   SpO2 96%   BMI 37.80 kg/m  Physical Exam Vitals and nursing note reviewed.  Constitutional:      Appearance: He is well-developed.  HENT:     Head: Normocephalic.     Nose: Nose normal.  Eyes:     General: No scleral icterus.    Conjunctiva/sclera: Conjunctivae normal.  Neck:     Thyroid: No thyromegaly.     Trachea: No tracheal deviation.  Cardiovascular:     Rate and Rhythm: Normal rate and regular rhythm.     Heart sounds: No murmur heard.    No friction rub. No gallop.  Pulmonary:     Breath sounds: No stridor. No wheezing or rales.  Chest:     Chest wall: No tenderness.  Abdominal:     General: There is no distension.     Tenderness: There is no abdominal tenderness. There is no rebound.     Comments: Mild edema in the abdomen  Musculoskeletal:        General: Normal range of motion.     Cervical back: Neck supple.     Comments: 3+ edema all the way up both legs  Lymphadenopathy:     Cervical: No cervical adenopathy.  Skin:    General: Skin is warm.     Findings: No erythema or rash.  Neurological:     Mental Status: He is oriented to person, place, and time.     Motor: No abnormal muscle tone.     Coordination: Coordination normal.  Psychiatric:        Behavior: Behavior normal.     ED Results / Procedures / Treatments   Labs (all labs ordered are listed, but only abnormal results are displayed) Labs Reviewed  CBC WITH DIFFERENTIAL/PLATELET - Abnormal; Notable for the following components:      Result Value   Hemoglobin 11.2 (*)    HCT 37.9 (*)    MCH 23.6 (*)    MCHC 29.6 (*)    RDW 16.4 (*)    All other components within normal limits  BASIC METABOLIC PANEL -  Abnormal; Notable for the following components:   Potassium 3.1 (*)    Glucose, Bld 169 (*)    BUN 22 (*)    Creatinine, Ser 2.52 (*)    GFR, Estimated 32 (*)    All other components within normal limits  BRAIN NATRIURETIC PEPTIDE - Abnormal; Notable for the following components:   B Natriuretic Peptide 648.6 (*)    All other components within normal limits    EKG None  Radiology DG Chest Port 1 View  Result Date: 12/22/2021 CLINICAL DATA:  Shortness of breath. EXAM: PORTABLE CHEST 1 VIEW COMPARISON:  Chest x-ray 06/02/2021. FINDINGS: The heart is enlarged, unchanged. The lungs are clear. There is no pleural effusion pneumothorax. No acute fractures are seen. IMPRESSION: 1. No acute cardiopulmonary process. 2. Stable cardiomegaly. Electronically Signed   By: Ronney Asters M.D.   On: 12/22/2021 22:42    Procedures Procedures    Medications Ordered in ED Medications  furosemide (LASIX) injection 40 mg (40 mg Intravenous Given 12/22/21 2207)    ED Course/ Medical Decision Making/ A&P                           Medical Decision Making Amount and/or Complexity of Data Reviewed Labs: ordered. Radiology: ordered. ECG/medicine tests: ordered.  Risk Prescription drug management. Decision regarding hospitalization.  This patient presents to the ED for concern of swelling in legs and abdomen, this involves an extensive number of treatment options, and is a complaint that carries with it a high risk of complications and morbidity.  The differential diagnosis includes anasarca of from cardiac disease, liver disease and kidney disease   Co morbidities that complicate the patient evaluation  Diabetes, hypertension, kidney disease   Additional history obtained:  Additional history obtained from patient External records from outside source obtained and reviewed including hospital records   Lab Tests:  I Ordered, and personally interpreted labs.  The pertinent results include:  White count 7.2, hemoglobin 11.2 creatinine 2.5, BNP 650   Imaging Studies ordered:  I ordered imaging studies including chest x-ray I independently visualized and interpreted imaging which showed negative I agree with the radiologist interpretation   Cardiac Monitoring: / EKG:  The patient was maintained on a cardiac monitor.  I personally viewed and interpreted the cardiac monitored which showed an underlying rhythm of: Normal sinus rhythm   Consultations Obtained:  I requested consultation with the hospitalist,  and discussed lab and imaging findings as well as pertinent plan - they recommend: Admit   Problem List / ED Course / Critical interventions / Medication management  Diabetes hypertension kidney disease and swelling I ordered medication including Lasix for edema Reevaluation of the patient after these medicines showed that the patient stayed the same I have reviewed the patients home medicines and have made adjustments as needed   Social Determinants of Health:  None   Test / Admission - Considered:  No additional test in the emergency department but patient is admitted  Patient with anasarca.  He will be admitted to medicine        Final Clinical Impression(s) / ED Diagnoses Final diagnoses:  Anasarca    Rx / DC Orders ED Discharge Orders     None         Milton [place], MD 12/27/21 1207

## 2021-12-22 NOTE — Assessment & Plan Note (Signed)
Continue Semglee 5 units nightly plus SSI.

## 2021-12-23 DIAGNOSIS — Z79899 Other long term (current) drug therapy: Secondary | ICD-10-CM | POA: Diagnosis not present

## 2021-12-23 DIAGNOSIS — Z7984 Long term (current) use of oral hypoglycemic drugs: Secondary | ICD-10-CM | POA: Diagnosis not present

## 2021-12-23 DIAGNOSIS — I152 Hypertension secondary to endocrine disorders: Secondary | ICD-10-CM | POA: Diagnosis present

## 2021-12-23 DIAGNOSIS — Z794 Long term (current) use of insulin: Secondary | ICD-10-CM | POA: Diagnosis not present

## 2021-12-23 DIAGNOSIS — I130 Hypertensive heart and chronic kidney disease with heart failure and stage 1 through stage 4 chronic kidney disease, or unspecified chronic kidney disease: Secondary | ICD-10-CM | POA: Diagnosis present

## 2021-12-23 DIAGNOSIS — E669 Obesity, unspecified: Secondary | ICD-10-CM | POA: Diagnosis present

## 2021-12-23 DIAGNOSIS — Z87891 Personal history of nicotine dependence: Secondary | ICD-10-CM | POA: Diagnosis not present

## 2021-12-23 DIAGNOSIS — Z833 Family history of diabetes mellitus: Secondary | ICD-10-CM | POA: Diagnosis not present

## 2021-12-23 DIAGNOSIS — Z8673 Personal history of transient ischemic attack (TIA), and cerebral infarction without residual deficits: Secondary | ICD-10-CM | POA: Diagnosis not present

## 2021-12-23 DIAGNOSIS — I272 Pulmonary hypertension, unspecified: Secondary | ICD-10-CM | POA: Diagnosis present

## 2021-12-23 DIAGNOSIS — R601 Generalized edema: Secondary | ICD-10-CM | POA: Diagnosis not present

## 2021-12-23 DIAGNOSIS — N1832 Chronic kidney disease, stage 3b: Secondary | ICD-10-CM | POA: Diagnosis present

## 2021-12-23 DIAGNOSIS — E876 Hypokalemia: Secondary | ICD-10-CM | POA: Diagnosis present

## 2021-12-23 DIAGNOSIS — Z91148 Patient's other noncompliance with medication regimen for other reason: Secondary | ICD-10-CM | POA: Diagnosis not present

## 2021-12-23 DIAGNOSIS — I5033 Acute on chronic diastolic (congestive) heart failure: Secondary | ICD-10-CM | POA: Diagnosis present

## 2021-12-23 DIAGNOSIS — E1159 Type 2 diabetes mellitus with other circulatory complications: Secondary | ICD-10-CM | POA: Diagnosis not present

## 2021-12-23 DIAGNOSIS — Z7982 Long term (current) use of aspirin: Secondary | ICD-10-CM | POA: Diagnosis not present

## 2021-12-23 DIAGNOSIS — E1169 Type 2 diabetes mellitus with other specified complication: Secondary | ICD-10-CM | POA: Diagnosis present

## 2021-12-23 DIAGNOSIS — Z6836 Body mass index (BMI) 36.0-36.9, adult: Secondary | ICD-10-CM | POA: Diagnosis not present

## 2021-12-23 DIAGNOSIS — Z8249 Family history of ischemic heart disease and other diseases of the circulatory system: Secondary | ICD-10-CM | POA: Diagnosis not present

## 2021-12-23 DIAGNOSIS — E1122 Type 2 diabetes mellitus with diabetic chronic kidney disease: Secondary | ICD-10-CM | POA: Diagnosis present

## 2021-12-23 LAB — GLUCOSE, CAPILLARY
Glucose-Capillary: 121 mg/dL — ABNORMAL HIGH (ref 70–99)
Glucose-Capillary: 188 mg/dL — ABNORMAL HIGH (ref 70–99)
Glucose-Capillary: 157 mg/dL — ABNORMAL HIGH (ref 70–99)
Glucose-Capillary: 109 mg/dL — ABNORMAL HIGH (ref 70–99)

## 2021-12-23 LAB — BASIC METABOLIC PANEL
Anion gap: 6 (ref 5–15)
BUN: 21 mg/dL — ABNORMAL HIGH (ref 6–20)
CO2: 28 mmol/L (ref 22–32)
Calcium: 9.1 mg/dL (ref 8.9–10.3)
Chloride: 110 mmol/L (ref 98–111)
Creatinine, Ser: 2.41 mg/dL — ABNORMAL HIGH (ref 0.61–1.24)
GFR, Estimated: 33 mL/min — ABNORMAL LOW (ref >60)
Glucose, Bld: 123 mg/dL — ABNORMAL HIGH (ref 70–99)
Potassium: 3.1 mmol/L — ABNORMAL LOW (ref 3.5–5.1)
Sodium: 144 mmol/L (ref 135–145)

## 2021-12-23 LAB — CBC
HCT: 36.4 % — ABNORMAL LOW (ref 39.0–52.0)
Hemoglobin: 10.6 g/dL — ABNORMAL LOW (ref 13.0–17.0)
MCH: 23.3 pg — ABNORMAL LOW (ref 26.0–34.0)
MCHC: 29.1 g/dL — ABNORMAL LOW (ref 30.0–36.0)
MCV: 80 fL (ref 80.0–100.0)
Platelets: 339 10*3/uL (ref 150–400)
RBC: 4.55 MIL/uL (ref 4.22–5.81)
RDW: 16.4 % — ABNORMAL HIGH (ref 11.5–15.5)
WBC: 6.8 10*3/uL (ref 4.0–10.5)
nRBC: 0 % (ref 0.0–0.2)

## 2021-12-23 LAB — HEMOGLOBIN A1C
Hgb A1c MFr Bld: 8.3 % — ABNORMAL HIGH (ref 4.8–5.6)
Mean Plasma Glucose: 191.51 mg/dL

## 2021-12-23 LAB — MAGNESIUM: Magnesium: 2.2 mg/dL (ref 1.7–2.4)

## 2021-12-23 MED ORDER — POTASSIUM CHLORIDE CRYS ER 20 MEQ PO TBCR
40.0000 meq | EXTENDED_RELEASE_TABLET | Freq: Once | ORAL | Status: AC
  Administered 2021-12-23: 40 meq via ORAL
  Filled 2021-12-23: qty 2

## 2021-12-23 NOTE — TOC Initial Note (Signed)
Transition of Care [facility]) - Initial/Assessment Note    Patient Details  Name: Micheal Levy MRN: [id number] Date of Birth: Jan 16, 1981  Transition of Care [facility]) CM/SW Contact:    Roseanne Kaufman, RN Phone Number: 12/23/2021, 3:28 PM  Clinical Narrative:     Received [facility] consult for CHF screening, patient is not appropriate for CHF protocol. This RCM attempted to contact patient unsuccessful.   TOC will continue to follow.               Expected Discharge Plan: Home/Self Care Barriers to Discharge: Continued Medical Work up   Patient Goals and CMS Choice     Choice offered to / list presented to : NA  Expected Discharge Plan and Services Expected Discharge Plan: Home/Self Care In-house Referral: NA Discharge Planning Services: CM Consult Post Acute Care Choice: NA Living arrangements for the past 2 months: Apartment                 DME Arranged: N/A DME Agency: NA         HH Agency: NA        Prior Living Arrangements/Services Living arrangements for the past 2 months: Apartment Lives with:: Spouse              Current home services: DME (glucometer)    Activities of Daily Living Home Assistive Devices/Equipment: CBG Meter ADL Screening (condition at time of admission) Patient's cognitive ability adequate to safely complete daily activities?: Yes Is the patient deaf or have difficulty hearing?: Yes Does the patient have difficulty seeing, even when wearing glasses/contacts?: No Does the patient have difficulty concentrating, remembering, or making decisions?: No Patient able to express need for assistance with ADLs?: Yes Does the patient have difficulty dressing or bathing?: No Independently performs ADLs?: Yes (appropriate for developmental age) Does the patient have difficulty walking or climbing stairs?: No Weakness of Legs: None Weakness of Arms/Hands: None  Permission Sought/Granted                  Emotional Assessment          Alcohol / Substance Use: Not Applicable Psych Involvement: No (comment)  Admission diagnosis:  Anasarca [R60.1] Acute on chronic heart failure with preserved ejection fraction (HFpEF) ([place]) [I50.33] Patient Active Problem List   Diagnosis Date Noted   Acute on chronic heart failure with preserved ejection fraction (HFpEF) ([place]) 12/22/2021   Chronic kidney disease, stage 3b ([place]) 06/24/2021   Insulin-requiring or dependent type II diabetes mellitus ([place]) 06/24/2021   Hyperlipidemia associated with type 2 diabetes mellitus ([place]) 06/24/2021   Hyperkalemia 06/01/2021   Restrictive cardiomyopathy ([place]) 05/29/2021   Pulmonary hypertension ([place]) 05/29/2021   Normocytic anemia 05/29/2021   Obesity (BMI 30-39.9) 05/29/2021   Acute on chronic diastolic CHF (congestive heart failure) ([place]) 05/08/2021   Acute streptococcal tonsillitis 04/22/2021   Chronic diastolic heart failure ([place]) 04/22/2021   Hypertension associated with diabetes ([place]) 02/26/2021   Medication management 02/26/2021   Class 2 obesity 02/26/2021   CHF exacerbation ([place]) 02/20/2021   Type 2 diabetes mellitus with other specified complication ([place]) 0000000   Dyslipidemia 02/20/2021   Abnormal EKG 02/20/2021   Acute respiratory failure with hypoxia ([place]) 01/12/2021   Chronic kidney disease, stage 3a ([place])    Acute diastolic CHF (congestive heart failure) (HCC)    Elevated troponin    History of CVA (cerebrovascular accident)    Ankle fracture, bimalleolar, closed, left, initial encounter 01/18/2020   Seizures ([place]) 09/04/2019  Altered mental status    Hypertensive urgency 04/21/2019   Essential hypertension 04/21/2019   Uncontrolled type 2 diabetes mellitus with hyperglycemia ([place]) 04/21/2019   Hemoglobin A1C between 7% and 9% indicating borderline diabetic control ([place]) 04/21/2019   Incarceration 04/21/2019   History of seizures 04/21/2019   Hypokalemia 04/21/2019   History of substance abuse ([place]) 04/05/2017    Cerebral embolism with cerebral infarction 03/23/2017   Hypertensive emergency    Ventilator dependent ([place])    Hyperglycemia 03/21/2017   PCP:  [name], NP Pharmacy:   [id number] - [place], [place] [address] [place] [address] Phone: [phone] Fax: [phone]  [place], [place]. [place] [address] Phone: [phone] Fax: [phone]     Social Determinants of Health (SDOH) Interventions    Readmission Risk Interventions    05/31/2021   12:23 PM 05/22/2021    9:31 AM  Readmission Risk Prevention Plan  Transportation Screening Complete Complete  Medication Review ([place]) Complete Complete  PCP or Specialist appointment within 3-5 days of discharge Complete Complete  HRI or Home Care Consult Complete Complete  SW Recovery Care/Counseling Consult Complete Complete  Palliative Care Screening Not Applicable Not [place] Not Applicable Not Applicable

## 2021-12-23 NOTE — Progress Notes (Signed)
PROGRESS NOTE  Micheal Levy [id number] DOB: [birth year] DOA: 12/22/2021 PCP: [name], NP   LOS: 0 days   Brief Narrative / Interim history: Micheal Levy is a 41 y.o. male with medical history significant for HFpEF (last EF 70-75% by TTE 05/18/2021), severe pHTN, CKD stage IIIb, insulin-dependent T2DM, HTN, history of watershed CVA, morbid obesity, substance use who presented to the ED for evaluation of worsening edema.  He denies any shortness of breath but has noticed decreased urination.  He was off of his blood pressure meds for the last week, just got them refilled, and swelling is gotten worse in this interval.  He also reports bloating, uncomfortable sensation in his stomach.  Significant events: 9/17-admit to the hospital  Significant imaging / results / micro data: Chest x-ray 9/17-no acute cardiopulmonary processes, stable cardiomegaly  Subjective / 24h Interval events: He is feeling well at rest.  Abdominal bloating a little bit better but still with significant swelling in his legs  Assesement and Plan: Principal Problem:   Acute on chronic heart failure with preserved ejection fraction (HFpEF) (HCC) Active Problems:   Chronic kidney disease, stage 3b ([place])   Hypertension associated with diabetes ([place])   Hypokalemia   Insulin-requiring or dependent type II diabetes mellitus ([place])  Principal problem Acute on chronic heart failure with preserved ejection fraction (HFpEF), anasarca- Presenting with progressively worsening peripheral edema, massive volume overload.  Standing weight this morning at 269, and he was 236 6 months ago.  Clearly he has significant fluid overload /anasarca that despite being on oral diuretics for the past few days did not improve.  Suspect underlying gut edema given bloating and continues to need IV diuretics. -Brisk diuresis overnight 1.9 L, continue IV Lasix 60 mg twice daily, may need to go up on the dose.  Monitor strict ins and  outs  Active problems  Chronic kidney disease, stage 3b (HCC) -Renal function stable and improved from previous, monitor closely with diuresis.  Continue to hold losartan.  Baseline creatinine anywhere from 2.4-3 earlier this year   Hypertension associated with diabetes-Continue home regimen with amlodipine, Coreg, clonidine, hydralazine, Imdur.     Hypokalemia -continue to supplement.  Magnesium normal   Insulin-requiring or dependent type II diabetes mellitus (HCC) Continue Semglee 5 units nightly plus SSI.  CBG (last 3)  Recent Labs    12/23/21 0746  GLUCAP 121*   Obesity, class II-BMI 39.  There is also a degree of fluid overload, will reassess once he is more euvolemic  Scheduled Meds:  amLODipine  10 mg Oral Daily   carvedilol  25 mg Oral BID WC   cloNIDine  0.3 mg Oral TID   furosemide  60 mg Intravenous Q12H   heparin  5,000 Units Subcutaneous Q8H   hydrALAZINE  100 mg Oral Q8H   insulin aspart  0-9 Units Subcutaneous TID WC   insulin glargine-yfgn  5 Units Subcutaneous QHS   isosorbide mononitrate  120 mg Oral Daily   Continuous Infusions: PRN Meds:.acetaminophen **OR** acetaminophen, ondansetron **OR** ondansetron (ZOFRAN) IV, senna-docusate  Current Outpatient Medications  Medication Instructions   acetaminophen (TYLENOL) 650 mg, Oral, Every 4 hours PRN   albuterol (VENTOLIN HFA) 108 (90 Base) MCG/ACT inhaler 2 puffs, Inhalation, Every 6 hours PRN   amLODipine (NORVASC) 10 mg, Oral, Daily   amLODipine (NORVASC) 10 mg, Oral, Daily   aspirin EC 81 mg, Oral, Daily   Blood Glucose Monitoring Suppl (TRUE METRIX AIR GLUCOSE METER) DEVI 1 each, Does  not apply, 3 times daily before meals & bedtime   carvedilol (COREG) 25 mg, Oral, 2 times daily with meals   carvedilol (COREG) 25 mg, Oral, 2 times daily with meals   cloNIDine (CATAPRES) 0.3 mg, Oral, 3 times daily   glipiZIDE (GLUCOTROL XL) 10 mg, Oral, Daily with breakfast   glucose blood (TRUE METRIX BLOOD GLUCOSE  TEST) test strip Use as instructed   hydrALAZINE (APRESOLINE) 100 mg, Oral, Every 8 hours   Insulin Pen Needle (PEN NEEDLES) 31G X 6 MM MISC 1 each, Does not apply, Daily at bedtime   INSULIN SYRINGE .5CC/29G (B-D INS SYR ULTRAFINE .5CC/29G) 29G X 1/2" 0.5 ML MISC 1 each, Does not apply, Daily at bedtime   isosorbide mononitrate (IMDUR) 120 mg, Oral, Daily   Lancets MISC 1 each, Does not apply, 3 times daily before meals & bedtime   Lantus SoloStar 5 Units, Subcutaneous, Daily at 10 pm   losartan (COZAAR) 50 mg, Oral, Daily   Multiple Vitamins-Minerals (MULTIVITAMIN ADULTS) TABS 1 tablet, Oral, Daily   pantoprazole (PROTONIX) 40 mg, Oral, Daily at bedtime   torsemide (DEMADEX) 80 mg, Oral, Daily    Diet Orders (From admission, onward)     Start     Ordered   12/22/21 2344  Diet heart healthy/carb modified Room service appropriate? Yes; Fluid consistency: Thin  Diet effective now       Question Answer Comment  Diet-HS Snack? Nothing   Room service appropriate? Yes   Fluid consistency: Thin      12/22/21 2344            DVT prophylaxis: heparin injection 5,000 Units Start: 12/22/21 2345   Lab Results  Component Value Date   PLT 339 12/23/2021      Code Status: Full Code  Family Communication: no family at bedside   Status is: Observation  The patient will require care spanning > 2 midnights and should be moved to inpatient because: Requires IV diuresis   Level of care: Telemetry  Consultants:  none  Objective: Vitals:   12/22/21 2300 12/23/21 0001 12/23/21 0627 12/23/21 0700  BP: (!) 166/94 (!) 162/113 (!) 175/107   Pulse: 70 78 77   Resp: (!) 24 20 18    Temp:  99.4 F (37.4 C) 98.2 F (36.8 C)   TempSrc:  Oral    SpO2: 96% 100% 94%   Weight:    122.3 kg  Height:        Intake/Output Summary (Last 24 hours) at 12/23/2021 1123 Last data filed at 12/23/2021 0900 Gross per 24 hour  Intake 400 ml  Output 2350 ml  Net -1950 ml   Wt Readings from Last 3  Encounters:  12/23/21 122.3 kg  06/24/21 107.5 kg  06/08/21 102.2 kg    Examination: Constitutional: NAD Eyes: no scleral icterus ENMT: Mucous membranes are moist.  Neck: normal, supple Respiratory: clear to auscultation bilaterally, no wheezing, no crackles.  Cardiovascular: Regular rate and rhythm, no murmurs / rubs / gallops.  3+ pitting lower extremity edema, anasarca Abdomen: Mild distended, no tenderness Musculoskeletal: no clubbing / cyanosis.  Skin: no rashes Neurologic: non focal  Data Reviewed: I have independently reviewed following labs and imaging studies   CBC Recent Labs  Lab 12/22/21 2208 12/23/21 0407  WBC 7.2 6.8  HGB 11.2* 10.6*  HCT 37.9* 36.4*  PLT 354 339  MCV 80.0 80.0  MCH 23.6* 23.3*  MCHC 29.6* 29.1*  RDW 16.4* 16.4*  LYMPHSABS 1.5  --  MONOABS 0.7  --   EOSABS 0.1  --   BASOSABS 0.0  --     Recent Labs  Lab 12/22/21 2208 12/23/21 0407  NA 143 144  K 3.1* 3.1*  CL 110 110  CO2 27 28  GLUCOSE 169* 123*  BUN 22* 21*  CREATININE 2.52* 2.41*  CALCIUM 9.4 9.1  MG 2.2  --   HGBA1C  --  8.3*  BNP 648.6*  --     ------------------------------------------------------------------------------------------------------------------ No results for input(s): "CHOL", "HDL", "LDLCALC", "TRIG", "CHOLHDL", "LDLDIRECT" in the last 72 hours.  Lab Results  Component Value Date   HGBA1C 8.3 (H) 12/23/2021   ------------------------------------------------------------------------------------------------------------------ No results for input(s): "TSH", "T4TOTAL", "T3FREE", "THYROIDAB" in the last 72 hours.  Invalid input(s): "FREET3"  Cardiac Enzymes No results for input(s): "CKMB", "TROPONINI", "MYOGLOBIN" in the last 168 hours.  Invalid input(s): "CK" ------------------------------------------------------------------------------------------------------------------    Component Value Date/Time   BNP 648.6 (H) 12/22/2021 2208     CBG: Recent Labs  Lab 12/23/21 0746  GLUCAP 121*    No results found for this or any previous visit (from the past 240 hour(s)).   Radiology Studies: DG Chest Port 1 View  Result Date: 12/22/2021 CLINICAL DATA:  Shortness of breath. EXAM: PORTABLE CHEST 1 VIEW COMPARISON:  Chest x-ray 06/02/2021. FINDINGS: The heart is enlarged, unchanged. The lungs are clear. There is no pleural effusion pneumothorax. No acute fractures are seen. IMPRESSION: 1. No acute cardiopulmonary process. 2. Stable cardiomegaly. Electronically Signed   By: Ronney Asters M.D.   On: 12/22/2021 22:42     Marzetta Board, MD, PhD [facility]  Between 7 am - 7 pm I am available, please contact me via Amion (for emergencies) or Securechat (non urgent messages)  Between 7 pm - 7 am I am not available, please contact night coverage MD/APP via Amion

## 2021-12-24 DIAGNOSIS — I5033 Acute on chronic diastolic (congestive) heart failure: Secondary | ICD-10-CM | POA: Diagnosis not present

## 2021-12-24 LAB — CBC
HCT: 37.7 % — ABNORMAL LOW (ref 39.0–52.0)
Hemoglobin: 11 g/dL — ABNORMAL LOW (ref 13.0–17.0)
MCH: 23.6 pg — ABNORMAL LOW (ref 26.0–34.0)
MCHC: 29.2 g/dL — ABNORMAL LOW (ref 30.0–36.0)
MCV: 80.9 fL (ref 80.0–100.0)
Platelets: 322 10*3/uL (ref 150–400)
RBC: 4.66 MIL/uL (ref 4.22–5.81)
RDW: 16.5 % — ABNORMAL HIGH (ref 11.5–15.5)
WBC: 5.6 10*3/uL (ref 4.0–10.5)
nRBC: 0 % (ref 0.0–0.2)

## 2021-12-24 LAB — COMPREHENSIVE METABOLIC PANEL
ALT: 12 U/L (ref 0–44)
AST: 15 U/L (ref 15–41)
Albumin: 3.2 g/dL — ABNORMAL LOW (ref 3.5–5.0)
Alkaline Phosphatase: 52 U/L (ref 38–126)
Anion gap: 9 (ref 5–15)
BUN: 22 mg/dL — ABNORMAL HIGH (ref 6–20)
CO2: 25 mmol/L (ref 22–32)
Calcium: 8.7 mg/dL — ABNORMAL LOW (ref 8.9–10.3)
Chloride: 105 mmol/L (ref 98–111)
Creatinine, Ser: 2.62 mg/dL — ABNORMAL HIGH (ref 0.61–1.24)
GFR, Estimated: 30 mL/min — ABNORMAL LOW (ref >60)
Glucose, Bld: 100 mg/dL — ABNORMAL HIGH (ref 70–99)
Potassium: 3.4 mmol/L — ABNORMAL LOW (ref 3.5–5.1)
Sodium: 139 mmol/L (ref 135–145)
Total Bilirubin: 0.6 mg/dL (ref 0.3–1.2)
Total Protein: 6.6 g/dL (ref 6.5–8.1)

## 2021-12-24 LAB — GLUCOSE, CAPILLARY
Glucose-Capillary: 113 mg/dL — ABNORMAL HIGH (ref 70–99)
Glucose-Capillary: 155 mg/dL — ABNORMAL HIGH (ref 70–99)
Glucose-Capillary: 160 mg/dL — ABNORMAL HIGH (ref 70–99)
Glucose-Capillary: 136 mg/dL — ABNORMAL HIGH (ref 70–99)

## 2021-12-24 LAB — MAGNESIUM: Magnesium: 1.9 mg/dL (ref 1.7–2.4)

## 2021-12-24 MED ORDER — POTASSIUM CHLORIDE CRYS ER 20 MEQ PO TBCR
40.0000 meq | EXTENDED_RELEASE_TABLET | Freq: Once | ORAL | Status: AC
  Administered 2021-12-24: 40 meq via ORAL
  Filled 2021-12-24: qty 2

## 2021-12-24 NOTE — Progress Notes (Signed)
PROGRESS NOTE  DECKLIN POINTER [id number] DOB: 13-Jun-1980 DOA: 12/22/2021 PCP: [name], NP   LOS: 1 day   Brief Narrative / Interim history: Micheal Levy is a 41 y.o. male with medical history significant for HFpEF (last EF 70-75% by TTE 05/18/2021), severe pHTN, CKD stage IIIb, insulin-dependent T2DM, HTN, history of watershed CVA, morbid obesity, substance use who presented to the ED for evaluation of worsening edema.  He denies any shortness of breath but has noticed decreased urination.  He was off of his blood pressure meds for the last week, just got them refilled, and swelling is gotten worse in this interval.  He also reports bloating, uncomfortable sensation in his stomach.  Significant events: 9/17-admit to the hospital  Significant imaging / results / micro data: Chest x-ray 9/17-no acute cardiopulmonary processes, stable cardiomegaly  Subjective / 24h Interval events: Feeling well, appreciates that the leg swelling is gotten better and no longer feels bloated.  Able to ambulate  Assesement and Plan: Principal Problem:   Acute on chronic heart failure with preserved ejection fraction (HFpEF) (HCC) Active Problems:   Chronic kidney disease, stage 3b (HCC)   Hypertension associated with diabetes ([place])   Hypokalemia   Insulin-requiring or dependent type II diabetes mellitus (HCC)   Acute on chronic diastolic CHF (congestive heart failure) ([place])  Principal problem Acute on chronic heart failure with preserved ejection fraction (HFpEF), anasarca- Presenting with progressively worsening peripheral edema, massive volume overload.  Baseline outpatient weight was 236 pounds about 6 months ago. Clearly he has significant fluid overload /anasarca that despite being on oral diuretics for the past few days did not improve.  Suspect underlying gut edema given bloating and continues to need IV diuretics. -Continue IV diuretics, appears to be net -4.1 L, weight this morning to  245 pounds, down 11 pounds from 2 days ago.  Still has fluid overload, has excellent urine output, continue diuresis  Active problems  Chronic kidney disease, stage 3b (HCC) -baseline creatinine anywhere from 2.4-3 earlier this year.  Overall his renal function is stable, creatinine 2.6 this morning.  Excellent urine output.  Continue furosemide.  Continue to hold losartan   Hypertension associated with diabetes-Continue home regimen with amlodipine, Coreg, clonidine, hydralazine, Imdur.  Blood pressure on the high side but overall acceptable   Hypokalemia -continue to supplement.  Magnesium normal   Insulin-requiring or dependent type II diabetes mellitus (HCC) Continue Semglee 5 units nightly plus SSI.  CBGs controlled  CBG (last 3)  Recent Labs    12/23/21 1648 12/23/21 2135 12/24/21 0736  GLUCAP 157* 109* 113*    Obesity, class I or II-BMI 36 now.  May change with diuresis  Scheduled Meds:  amLODipine  10 mg Oral Daily   carvedilol  25 mg Oral BID WC   cloNIDine  0.3 mg Oral TID   furosemide  60 mg Intravenous Q12H   heparin  5,000 Units Subcutaneous Q8H   hydrALAZINE  100 mg Oral Q8H   insulin aspart  0-9 Units Subcutaneous TID WC   insulin glargine-yfgn  5 Units Subcutaneous QHS   isosorbide mononitrate  120 mg Oral Daily   potassium chloride  40 mEq Oral Once   Continuous Infusions: PRN Meds:.acetaminophen **OR** acetaminophen, ondansetron **OR** ondansetron (ZOFRAN) IV, senna-docusate  Current Outpatient Medications  Medication Instructions   acetaminophen (TYLENOL) 650 mg, Oral, Every 4 hours PRN   albuterol (VENTOLIN HFA) 108 (90 Base) MCG/ACT inhaler 2 puffs, Inhalation, Every 6 hours PRN  amLODipine (NORVASC) 10 mg, Oral, Daily   amLODipine (NORVASC) 10 mg, Oral, Daily   aspirin EC 81 mg, Oral, Daily   Blood Glucose Monitoring Suppl (TRUE METRIX AIR GLUCOSE METER) DEVI 1 each, Does not apply, 3 times daily before meals & bedtime   carvedilol (COREG) 25 mg,  Oral, 2 times daily with meals   carvedilol (COREG) 25 mg, Oral, 2 times daily with meals   cloNIDine (CATAPRES) 0.3 mg, Oral, 3 times daily   glipiZIDE (GLUCOTROL XL) 10 mg, Oral, Daily with breakfast   glucose blood (TRUE METRIX BLOOD GLUCOSE TEST) test strip Use as instructed   hydrALAZINE (APRESOLINE) 100 mg, Oral, Every 8 hours   Insulin Pen Needle (PEN NEEDLES) 31G X 6 MM MISC 1 each, Does not apply, Daily at bedtime   INSULIN SYRINGE .5CC/29G (B-D INS SYR ULTRAFINE .5CC/29G) 29G X 1/2" 0.5 ML MISC 1 each, Does not apply, Daily at bedtime   isosorbide mononitrate (IMDUR) 120 mg, Oral, Daily   Lancets MISC 1 each, Does not apply, 3 times daily before meals & bedtime   Lantus SoloStar 5 Units, Subcutaneous, Daily at 10 pm   losartan (COZAAR) 50 mg, Oral, Daily   Multiple Vitamins-Minerals (MULTIVITAMIN ADULTS) TABS 1 tablet, Oral, Daily   pantoprazole (PROTONIX) 40 mg, Oral, Daily at bedtime   torsemide (DEMADEX) 80 mg, Oral, Daily    Diet Orders (From admission, onward)     Start     Ordered   12/22/21 2344  Diet heart healthy/carb modified Room service appropriate? Yes; Fluid consistency: Thin  Diet effective now       Question Answer Comment  Diet-HS Snack? Nothing   Room service appropriate? Yes   Fluid consistency: Thin      12/22/21 2344            DVT prophylaxis: heparin injection 5,000 Units Start: 12/22/21 2345   Lab Results  Component Value Date   PLT 322 12/24/2021      Code Status: Full Code  Family Communication: no family at bedside   Status is: Inpatient  Level of care: Telemetry  Consultants:  none  Objective: Vitals:   12/23/21 1708 12/23/21 2138 12/24/21 0450 12/24/21 0800  BP: (!) 170/104 (!) 148/96 (!) 168/114   Pulse: 62 (!) 59 62   Resp: 18 16 18 15   Temp: 98.6 F (37 C) 98 F (36.7 C) 98 F (36.7 C)   TempSrc: Oral Oral Oral   SpO2: 97% 98% 98%   Weight:   111.2 kg   Height:        Intake/Output Summary (Last 24 hours)  at 12/24/2021 1048 Last data filed at 12/24/2021 0740 Gross per 24 hour  Intake 240 ml  Output 2425 ml  Net -2185 ml    Wt Readings from Last 3 Encounters:  12/24/21 111.2 kg  06/24/21 107.5 kg  06/08/21 102.2 kg    Examination: Constitutional: NAD Eyes: lids and conjunctivae normal, no scleral icterus ENMT: mmm Neck: normal, supple Respiratory: clear to auscultation bilaterally, no wheezing, no crackles. Normal respiratory effort.  Cardiovascular: Regular rate and rhythm, no murmurs / rubs / gallops.  2+ pitting lower extremity edema Abdomen: Mild distention, no tenderness. Bowel sounds positive.  Skin: no rashes Neurologic: no focal deficits, equal strength  Data Reviewed: I have independently reviewed following labs and imaging studies   CBC Recent Labs  Lab 12/22/21 2208 12/23/21 0407 12/24/21 0414  WBC 7.2 6.8 5.6  HGB 11.2* 10.6* 11.0*  HCT 37.9*  36.4* 37.7*  PLT 354 339 322  MCV 80.0 80.0 80.9  MCH 23.6* 23.3* 23.6*  MCHC 29.6* 29.1* 29.2*  RDW 16.4* 16.4* 16.5*  LYMPHSABS 1.5  --   --   MONOABS 0.7  --   --   EOSABS 0.1  --   --   BASOSABS 0.0  --   --      Recent Labs  Lab 12/22/21 2208 12/23/21 0407 12/24/21 0414  NA 143 144 139  K 3.1* 3.1* 3.4*  CL 110 110 105  CO2 27 28 25   GLUCOSE 169* 123* 100*  BUN 22* 21* 22*  CREATININE 2.52* 2.41* 2.62*  CALCIUM 9.4 9.1 8.7*  AST  --   --  15  ALT  --   --  12  ALKPHOS  --   --  52  BILITOT  --   --  0.6  ALBUMIN  --   --  3.2*  MG 2.2  --  1.9  HGBA1C  --  8.3*  --   BNP 648.6*  --   --      ------------------------------------------------------------------------------------------------------------------ No results for input(s): "CHOL", "HDL", "LDLCALC", "TRIG", "CHOLHDL", "LDLDIRECT" in the last 72 hours.  Lab Results  Component Value Date   HGBA1C 8.3 (H) 12/23/2021   ------------------------------------------------------------------------------------------------------------------ No  results for input(s): "TSH", "T4TOTAL", "T3FREE", "THYROIDAB" in the last 72 hours.  Invalid input(s): "FREET3"  Cardiac Enzymes No results for input(s): "CKMB", "TROPONINI", "MYOGLOBIN" in the last 168 hours.  Invalid input(s): "CK" ------------------------------------------------------------------------------------------------------------------    Component Value Date/Time   BNP 648.6 (H) 12/22/2021 2208    CBG: Recent Labs  Lab 12/23/21 0746 12/23/21 1229 12/23/21 1648 12/23/21 2135 12/24/21 0736  GLUCAP 121* 188* 157* 109* 113*     No results found for this or any previous visit (from the past 240 hour(s)).   Radiology Studies: No results found.   Marzetta Board, MD, PhD [facility]  Between 7 am - 7 pm I am available, please contact me via Amion (for emergencies) or Securechat (non urgent messages)  Between 7 pm - 7 am I am not available, please contact night coverage MD/APP via Amion

## 2021-12-25 DIAGNOSIS — N1832 Chronic kidney disease, stage 3b: Secondary | ICD-10-CM

## 2021-12-25 DIAGNOSIS — R601 Generalized edema: Secondary | ICD-10-CM

## 2021-12-25 DIAGNOSIS — I152 Hypertension secondary to endocrine disorders: Secondary | ICD-10-CM

## 2021-12-25 DIAGNOSIS — I5033 Acute on chronic diastolic (congestive) heart failure: Secondary | ICD-10-CM | POA: Diagnosis not present

## 2021-12-25 DIAGNOSIS — E1159 Type 2 diabetes mellitus with other circulatory complications: Secondary | ICD-10-CM

## 2021-12-25 LAB — COMPREHENSIVE METABOLIC PANEL
ALT: 12 U/L (ref 0–44)
AST: 15 U/L (ref 15–41)
Albumin: 3.2 g/dL — ABNORMAL LOW (ref 3.5–5.0)
Alkaline Phosphatase: 53 U/L (ref 38–126)
Anion gap: 9 (ref 5–15)
BUN: 27 mg/dL — ABNORMAL HIGH (ref 6–20)
CO2: 24 mmol/L (ref 22–32)
Calcium: 8.8 mg/dL — ABNORMAL LOW (ref 8.9–10.3)
Chloride: 106 mmol/L (ref 98–111)
Creatinine, Ser: 2.76 mg/dL — ABNORMAL HIGH (ref 0.61–1.24)
GFR, Estimated: 28 mL/min — ABNORMAL LOW (ref >60)
Glucose, Bld: 103 mg/dL — ABNORMAL HIGH (ref 70–99)
Potassium: 3.4 mmol/L — ABNORMAL LOW (ref 3.5–5.1)
Sodium: 139 mmol/L (ref 135–145)
Total Bilirubin: 0.7 mg/dL (ref 0.3–1.2)
Total Protein: 6.4 g/dL — ABNORMAL LOW (ref 6.5–8.1)

## 2021-12-25 LAB — CBC
HCT: 37.7 % — ABNORMAL LOW (ref 39.0–52.0)
Hemoglobin: 11.1 g/dL — ABNORMAL LOW (ref 13.0–17.0)
MCH: 23.5 pg — ABNORMAL LOW (ref 26.0–34.0)
MCHC: 29.4 g/dL — ABNORMAL LOW (ref 30.0–36.0)
MCV: 79.7 fL — ABNORMAL LOW (ref 80.0–100.0)
Platelets: 312 10*3/uL (ref 150–400)
RBC: 4.73 MIL/uL (ref 4.22–5.81)
RDW: 16.2 % — ABNORMAL HIGH (ref 11.5–15.5)
WBC: 5.2 10*3/uL (ref 4.0–10.5)
nRBC: 0 % (ref 0.0–0.2)

## 2021-12-25 LAB — PHOSPHORUS: Phosphorus: 4.3 mg/dL (ref 2.5–4.6)

## 2021-12-25 LAB — GLUCOSE, CAPILLARY: Glucose-Capillary: 114 mg/dL — ABNORMAL HIGH (ref 70–99)

## 2021-12-25 LAB — MAGNESIUM: Magnesium: 1.9 mg/dL (ref 1.7–2.4)

## 2021-12-25 MED ORDER — SENNOSIDES-DOCUSATE SODIUM 8.6-50 MG PO TABS: 1.0000 | ORAL_TABLET | Freq: Every evening | ORAL | Status: DC | PRN

## 2021-12-25 MED ORDER — TORSEMIDE 20 MG PO TABS: 80.0000 mg | ORAL_TABLET | Freq: Every day | ORAL | 2 refills | Status: DC

## 2021-12-25 MED ORDER — POTASSIUM CHLORIDE CRYS ER 20 MEQ PO TBCR
40.0000 meq | EXTENDED_RELEASE_TABLET | Freq: Once | ORAL | Status: AC
  Administered 2021-12-25: 40 meq via ORAL
  Filled 2021-12-25: qty 2

## 2021-12-25 MED ORDER — LOSARTAN POTASSIUM 50 MG PO TABS
50.0000 mg | ORAL_TABLET | Freq: Every day | ORAL | Status: DC
  Administered 2021-12-25: 50 mg via ORAL
  Filled 2021-12-25: qty 1

## 2021-12-25 MED ORDER — TORSEMIDE 20 MG PO TABS
80.0000 mg | ORAL_TABLET | Freq: Once | ORAL | Status: AC
  Administered 2021-12-25: 80 mg via ORAL
  Filled 2021-12-25: qty 4

## 2021-12-25 NOTE — Discharge Summary (Signed)
Physician Discharge Summary   Patient: Micheal Levy MRN: [id number] DOB: [birth year]  Admit date:     12/22/2021  Discharge date: 12/26/21  Discharge Physician: Hosie Poisson   PCP: [name], NP   Recommendations at discharge:  Please follow up with PCp in one week.  Please follow up with cardiology in one week.  Please follow up with CBC and BMP in one week.   Discharge Diagnoses: Principal Problem:   Acute on chronic heart failure with preserved ejection fraction (HFpEF) (HCC) Active Problems:   Chronic kidney disease, stage 3b (HCC)   Hypertension associated with diabetes ([place])   Hypokalemia   Insulin-requiring or dependent type II diabetes mellitus (HCC)   Acute on chronic diastolic CHF (congestive heart failure) [facility])   Hospital Course:  Micheal Levy is a 41 y.o. male with medical history significant for HFpEF (last EF 70-75% by TTE 05/18/2021), severe pHTN, CKD stage IIIb, insulin-dependent T2DM, HTN, history of watershed CVA, morbid obesity, substance use who presented to the ED for evaluation of worsening edema.  He denies any shortness of breath but has noticed decreased urination.  He was off of his blood pressure meds for the last week, just got them refilled, and swelling is gotten worse in this interval.  He also reports bloating, uncomfortable sensation in his stomach.    Assessment and Plan: * Acute on chronic heart failure with preserved ejection fraction (HFpEF) ([place]) Presenting with progressively worsening peripheral edema, massive volume overload.  Baseline outpatient weight was 236 pounds about 6 months ago. Clearly he has significant fluid overload Sallyanne Havers .  He had diuresed about 6.5 liters, he wanted to go home and follow up with cardiology by the end of the month.  He reports that he has been out of meds for more than 1 1/2 half, .  He was transitioned to oral diuretic and given prescriptions for the rest of the meds.    Chronic kidney disease,  stage 3b ([place]) Renal function stable and improved from previous, monitor closely with diuresis.    Hypertension associated with diabetes ([place]) -Continue amlodipine 10 mg daily -Continue Coreg 25 mg twice daily -Continue clonidine 0.3 mg 3 times daily -Continue hydralazine 100 mg 3 times daily -Continue Imdur 120 mg daily  Hypokalemia Replaced.   Insulin-requiring or dependent type II diabetes mellitus (HCC) Continue Semglee 5 units nightly plus SSI.  Obesity, class I or II-BMI 36 now.       Consultants: none.  Procedures performed: none. Disposition: Home Diet recommendation:  Discharge Diet Orders (From admission, onward)     Start     Ordered   12/25/21 0000  Diet - low sodium heart healthy        12/25/21 1050           Cardiac diet DISCHARGE MEDICATION: Allergies as of 12/25/2021   No Known Allergies      Medication List     STOP taking these medications    acetaminophen 325 MG tablet Commonly known as: TYLENOL   pantoprazole 40 MG tablet Commonly known as: PROTONIX       TAKE these medications    albuterol 108 (90 Base) MCG/ACT inhaler Commonly known as: VENTOLIN HFA Inhale 2 puffs into the lungs every 6 (six) hours as needed for wheezing or shortness of breath.   amLODipine 10 MG tablet Commonly known as: NORVASC Take 10 mg by mouth daily. What changed: Another medication with the same name was removed. Continue taking this  medication, and follow the directions you see here.   aspirin EC 81 MG tablet Take 1 tablet (81 mg total) by mouth daily.   carvedilol 25 MG tablet Commonly known as: COREG Take 25 mg by mouth 2 (two) times daily with a meal. What changed: Another medication with the same name was removed. Continue taking this medication, and follow the directions you see here.   cloNIDine 0.3 MG tablet Commonly known as: CATAPRES Take 1 tablet (0.3 mg total) by mouth 3 (three) times daily.   glipiZIDE 10 MG 24 hr  tablet Commonly known as: Glucotrol XL Take 1 tablet (10 mg total) by mouth daily with breakfast.   hydrALAZINE 100 MG tablet Commonly known as: APRESOLINE Take 1 tablet (100 mg total) by mouth every 8 (eight) hours.   INSULIN SYRINGE .5CC/29G 29G X 1/2" 0.5 ML Misc Commonly known as: B-D INS SYR ULTRAFINE .5CC/29G 1 each by Does not apply route at bedtime.   isosorbide mononitrate 120 MG 24 hr tablet Commonly known as: IMDUR Take 1 tablet (120 mg total) by mouth daily.   Lancets Misc 1 each by Does not apply route 4 (four) times daily -  before meals and at bedtime.   Lantus SoloStar 100 UNIT/ML Solostar Pen Generic drug: insulin glargine Inject 5 Units into the skin daily at 10 pm.   losartan 50 MG tablet Commonly known as: COZAAR Take 50 mg by mouth daily.   Multivitamin Adults Tabs Take 1 tablet by mouth daily.   Pen Needles 31G X 6 MM Misc 1 each by Does not apply route at bedtime.   senna-docusate 8.6-50 MG tablet Commonly known as: Senokot-S Take 1 tablet by mouth at bedtime as needed for mild constipation.   torsemide 20 MG tablet Commonly known as: DEMADEX Take 4 tablets (80 mg total) by mouth daily.   True Metrix Air Glucose Meter Devi 1 each by Does not apply route 4 (four) times daily -  before meals and at bedtime.   True Metrix Blood Glucose Test test strip Generic drug: glucose blood Use as instructed What changed:  how much to take how to take this when to take this additional instructions        Discharge Exam: Filed Weights   12/23/21 0700 12/24/21 0450 12/25/21 0528  Weight: 122.3 kg 111.2 kg 122 kg   General exam: Appears calm and comfortable  Respiratory system: Clear to auscultation. Respiratory effort normal. Cardiovascular system: S1 & S2 heard, RRR. No JVD, pedal edema present.  Gastrointestinal system: Abdomen is nondistended, soft and nontender.  Normal bowel sounds heard. Central nervous system: Alert and oriented. No focal  neurological deficits. Extremities: Symmetric 5 x 5 power. Skin: No rashes, lesions or ulcers Psychiatry: Mood & affect appropriate.    Condition at discharge: fair  The results of significant diagnostics from this hospitalization (including imaging, microbiology, ancillary and laboratory) are listed below for reference.   Imaging Studies: DG Chest Port 1 View  Result Date: 12/22/2021 CLINICAL DATA:  Shortness of breath. EXAM: PORTABLE CHEST 1 VIEW COMPARISON:  Chest x-ray 06/02/2021. FINDINGS: The heart is enlarged, unchanged. The lungs are clear. There is no pleural effusion pneumothorax. No acute fractures are seen. IMPRESSION: 1. No acute cardiopulmonary process. 2. Stable cardiomegaly. Electronically Signed   By: Ronney Asters M.D.   On: 12/22/2021 22:42    Microbiology: Results for orders placed or performed during the hospital encounter of 05/18/21  Resp Panel by RT-PCR (Flu A&B, Covid) Nasopharyngeal Swab  Status: None   Collection Time: 05/18/21 12:00 AM   Specimen: Nasopharyngeal Swab; Nasopharyngeal(NP) swabs in vial transport medium  Result Value Ref Range Status   SARS Coronavirus 2 by RT PCR NEGATIVE NEGATIVE Final    Comment: (NOTE) SARS-CoV-2 target nucleic acids are NOT DETECTED.  The SARS-CoV-2 RNA is generally detectable in upper respiratory specimens during the acute phase of infection. The lowest concentration of SARS-CoV-2 viral copies this assay can detect is 138 copies/mL. A negative result does not preclude SARS-Cov-2 infection and should not be used as the sole basis for treatment or other patient management decisions. A negative result may occur with  improper specimen collection/handling, submission of specimen other than nasopharyngeal swab, presence of viral mutation(s) within the areas targeted by this assay, and inadequate number of viral copies(<138 copies/mL). A negative result must be combined with clinical observations, patient history, and  epidemiological information. The expected result is Negative.  Fact Sheet for Patients:  EntrepreneurPulse.com.au  Fact Sheet for Healthcare Providers:  IncredibleEmployment.be  This test is no t yet approved or cleared by the [place] FDA and  has been authorized for detection and/or diagnosis of SARS-CoV-2 by FDA under an Emergency Use Authorization (EUA). This EUA will remain  in effect (meaning this test can be used) for the duration of the COVID-19 declaration under Section 564(b)(1) of the Act, 21 U.S.C.section 360bbb-3(b)(1), unless the authorization is terminated  or revoked sooner.       Influenza A by PCR NEGATIVE NEGATIVE Final   Influenza B by PCR NEGATIVE NEGATIVE Final    Comment: (NOTE) The Xpert Xpress SARS-CoV-2/FLU/RSV plus assay is intended as an aid in the diagnosis of influenza from Nasopharyngeal swab specimens and should not be used as a sole basis for treatment. Nasal washings and aspirates are unacceptable for Xpert Xpress SARS-CoV-2/FLU/RSV testing.  Fact Sheet for Patients: EntrepreneurPulse.com.au  Fact Sheet for Healthcare Providers: IncredibleEmployment.be  This test is not yet approved or cleared by the [place] FDA and has been authorized for detection and/or diagnosis of SARS-CoV-2 by FDA under an Emergency Use Authorization (EUA). This EUA will remain in effect (meaning this test can be used) for the duration of the COVID-19 declaration under Section 564(b)(1) of the Act, 21 U.S.C. section 360bbb-3(b)(1), unless the authorization is terminated or revoked.  Performed at [facility], [place] [address], [place], [place] [address]   Respiratory (~20 pathogens) panel by PCR     Status: None   Collection Time: 05/18/21  3:12 AM   Specimen: Nasopharyngeal Swab; Respiratory  Result Value Ref Range Status   Adenovirus NOT DETECTED NOT DETECTED  Final   Coronavirus 229E NOT DETECTED NOT DETECTED Final    Comment: (NOTE) The Coronavirus on the Respiratory Panel, DOES NOT test for the novel  Coronavirus (2019 nCoV)    Coronavirus HKU1 NOT DETECTED NOT DETECTED Final   Coronavirus NL63 NOT DETECTED NOT DETECTED Final   Coronavirus OC43 NOT DETECTED NOT DETECTED Final   Metapneumovirus NOT DETECTED NOT DETECTED Final   Rhinovirus / Enterovirus NOT DETECTED NOT DETECTED Final   Influenza A NOT DETECTED NOT DETECTED Final   Influenza B NOT DETECTED NOT DETECTED Final   Parainfluenza Virus 1 NOT DETECTED NOT DETECTED Final   Parainfluenza Virus 2 NOT DETECTED NOT DETECTED Final   Parainfluenza Virus 3 NOT DETECTED NOT DETECTED Final   Parainfluenza Virus 4 NOT DETECTED NOT DETECTED Final   Respiratory Syncytial Virus NOT DETECTED NOT DETECTED Final   Bordetella  pertussis NOT DETECTED NOT DETECTED Final   Bordetella Parapertussis NOT DETECTED NOT DETECTED Final   Chlamydophila pneumoniae NOT DETECTED NOT DETECTED Final   Mycoplasma pneumoniae NOT DETECTED NOT DETECTED Final    Comment: Performed at [facility], [place] [address] [place], [place] [address]  MRSA Next Gen by PCR, Nasal     Status: None   Collection Time: 05/18/21  4:38 AM   Specimen: Nasal Mucosa; Nasal Swab  Result Value Ref Range Status   MRSA by PCR Next Gen NOT DETECTED NOT DETECTED Final    Comment: (NOTE) The GeneXpert MRSA Assay (FDA approved for NASAL specimens only), is one component of a comprehensive MRSA colonization surveillance program. It is not intended to diagnose MRSA infection nor to guide or monitor treatment for MRSA infections. Test performance is not FDA approved in patients less than 48 years old. Performed at [facility], [place] [address] [place],  [address]     Labs: CBC: Recent Labs  Lab 12/22/21 2208 12/23/21 0407 12/24/21 0414 12/25/21 0415  WBC 7.2 6.8 5.6 5.2  NEUTROABS 4.9  --   --   --    HGB 11.2* 10.6* 11.0* 11.1*  HCT 37.9* 36.4* 37.7* 37.7*  MCV 80.0 80.0 80.9 79.7*  PLT 354 339 322 [id number]   Basic Metabolic Panel: Recent Labs  Lab 12/22/21 2208 12/23/21 0407 12/24/21 0414 12/25/21 0415  NA 143 144 139 139  K 3.1* 3.1* 3.4* 3.4*  CL 110 110 105 106  CO2 27 28 25 24   GLUCOSE 169* 123* 100* 103*  BUN 22* 21* 22* 27*  CREATININE 2.52* 2.41* 2.62* 2.76*  CALCIUM 9.4 9.1 8.7* 8.8*  MG 2.2  --  1.9 1.9  PHOS  --   --   --  4.3   Liver Function Tests: Recent Labs  Lab 12/24/21 0414 12/25/21 0415  AST 15 15  ALT 12 12  ALKPHOS 52 53  BILITOT 0.6 0.7  PROT 6.6 6.4*  ALBUMIN 3.2* 3.2*   CBG: Recent Labs  Lab 12/24/21 0736 12/24/21 1221 12/24/21 1731 12/24/21 2100 12/25/21 0732  GLUCAP 113* 155* 160* 136* 114*    Discharge time spent: 38 minutes.   Signed: Hosie Poisson, MD [facility] 12/26/2021

## 2022-05-22 ENCOUNTER — Emergency Department (HOSPITAL_COMMUNITY): Payer: Medicaid Other

## 2022-05-22 ENCOUNTER — Encounter (HOSPITAL_COMMUNITY): Payer: Self-pay

## 2022-05-22 ENCOUNTER — Inpatient Hospital Stay (HOSPITAL_COMMUNITY)
Admission: EM | Admit: 2022-05-22 | Discharge: 2022-05-30 | DRG: 304 | Disposition: A | Discharge status: Home / Self Care | Payer: Medicaid Other | Attending: Internal Medicine | Admitting: Internal Medicine

## 2022-05-22 DIAGNOSIS — E1165 Type 2 diabetes mellitus with hyperglycemia: Secondary | ICD-10-CM | POA: Diagnosis present

## 2022-05-22 DIAGNOSIS — N184 Chronic kidney disease, stage 4 (severe): Secondary | ICD-10-CM | POA: Diagnosis present

## 2022-05-22 DIAGNOSIS — E1122 Type 2 diabetes mellitus with diabetic chronic kidney disease: Secondary | ICD-10-CM | POA: Diagnosis present

## 2022-05-22 DIAGNOSIS — Z794 Long term (current) use of insulin: Secondary | ICD-10-CM

## 2022-05-22 DIAGNOSIS — I161 Hypertensive emergency: Principal | ICD-10-CM | POA: Diagnosis present

## 2022-05-22 DIAGNOSIS — E785 Hyperlipidemia, unspecified: Secondary | ICD-10-CM

## 2022-05-22 DIAGNOSIS — E876 Hypokalemia: Secondary | ICD-10-CM | POA: Diagnosis present

## 2022-05-22 DIAGNOSIS — R7989 Other specified abnormal findings of blood chemistry: Secondary | ICD-10-CM | POA: Diagnosis present

## 2022-05-22 DIAGNOSIS — E669 Obesity, unspecified: Secondary | ICD-10-CM | POA: Diagnosis present

## 2022-05-22 DIAGNOSIS — Z8673 Personal history of transient ischemic attack (TIA), and cerebral infarction without residual deficits: Secondary | ICD-10-CM

## 2022-05-22 DIAGNOSIS — E559 Vitamin D deficiency, unspecified: Secondary | ICD-10-CM | POA: Diagnosis present

## 2022-05-22 DIAGNOSIS — I5033 Acute on chronic diastolic (congestive) heart failure: Secondary | ICD-10-CM | POA: Diagnosis present

## 2022-05-22 DIAGNOSIS — Z79899 Other long term (current) drug therapy: Secondary | ICD-10-CM

## 2022-05-22 DIAGNOSIS — D6489 Other specified anemias: Secondary | ICD-10-CM | POA: Diagnosis present

## 2022-05-22 DIAGNOSIS — Z6839 Body mass index (BMI) 39.0-39.9, adult: Secondary | ICD-10-CM

## 2022-05-22 DIAGNOSIS — Z7982 Long term (current) use of aspirin: Secondary | ICD-10-CM

## 2022-05-22 DIAGNOSIS — I130 Hypertensive heart and chronic kidney disease with heart failure and stage 1 through stage 4 chronic kidney disease, or unspecified chronic kidney disease: Secondary | ICD-10-CM | POA: Diagnosis present

## 2022-05-22 DIAGNOSIS — Z87891 Personal history of nicotine dependence: Secondary | ICD-10-CM

## 2022-05-22 DIAGNOSIS — N1832 Chronic kidney disease, stage 3b: Secondary | ICD-10-CM | POA: Diagnosis present

## 2022-05-22 DIAGNOSIS — D631 Anemia in chronic kidney disease: Secondary | ICD-10-CM | POA: Diagnosis present

## 2022-05-22 DIAGNOSIS — Z91148 Patient's other noncompliance with medication regimen for other reason: Secondary | ICD-10-CM

## 2022-05-22 DIAGNOSIS — Z7984 Long term (current) use of oral hypoglycemic drugs: Secondary | ICD-10-CM

## 2022-05-22 DIAGNOSIS — Z833 Family history of diabetes mellitus: Secondary | ICD-10-CM

## 2022-05-22 DIAGNOSIS — N179 Acute kidney failure, unspecified: Secondary | ICD-10-CM | POA: Diagnosis present

## 2022-05-22 DIAGNOSIS — I10 Essential (primary) hypertension: Secondary | ICD-10-CM | POA: Diagnosis present

## 2022-05-22 DIAGNOSIS — Z8249 Family history of ischemic heart disease and other diseases of the circulatory system: Secondary | ICD-10-CM

## 2022-05-22 LAB — COMPREHENSIVE METABOLIC PANEL
ALT: 16 U/L (ref 0–44)
AST: 23 U/L (ref 15–41)
Albumin: 2.8 g/dL — ABNORMAL LOW (ref 3.5–5.0)
Alkaline Phosphatase: 65 U/L (ref 38–126)
Anion gap: 12 (ref 5–15)
BUN: 24 mg/dL — ABNORMAL HIGH (ref 6–20)
CO2: 27 mmol/L (ref 22–32)
Calcium: 8.5 mg/dL — ABNORMAL LOW (ref 8.9–10.3)
Chloride: 101 mmol/L (ref 98–111)
Creatinine, Ser: 3.55 mg/dL — ABNORMAL HIGH (ref 0.61–1.24)
GFR, Estimated: 21 mL/min — ABNORMAL LOW (ref >60)
Glucose, Bld: 158 mg/dL — ABNORMAL HIGH (ref 70–99)
Potassium: 2.7 mmol/L — CL (ref 3.5–5.1)
Sodium: 140 mmol/L (ref 135–145)
Total Bilirubin: 1 mg/dL (ref 0.3–1.2)
Total Protein: 6.5 g/dL (ref 6.5–8.1)

## 2022-05-22 LAB — CBC WITH DIFFERENTIAL/PLATELET
Abs Immature Granulocytes: 0.04 10*3/uL (ref 0.00–0.07)
Basophils Absolute: 0.1 10*3/uL (ref 0.0–0.1)
Basophils Relative: 1 %
Eosinophils Absolute: 0.1 10*3/uL (ref 0.0–0.5)
Eosinophils Relative: 1 %
HCT: 41.3 % (ref 39.0–52.0)
Hemoglobin: 12.8 g/dL — ABNORMAL LOW (ref 13.0–17.0)
Immature Granulocytes: 0 %
Lymphocytes Relative: 18 %
Lymphs Abs: 1.9 10*3/uL (ref 0.7–4.0)
MCH: 26.4 pg (ref 26.0–34.0)
MCHC: 31 g/dL (ref 30.0–36.0)
MCV: 85.3 fL (ref 80.0–100.0)
Monocytes Absolute: 0.5 10*3/uL (ref 0.1–1.0)
Monocytes Relative: 5 %
Neutro Abs: 7.7 10*3/uL (ref 1.7–7.7)
Neutrophils Relative %: 75 %
Platelets: 353 10*3/uL (ref 150–400)
RBC: 4.84 MIL/uL (ref 4.22–5.81)
RDW: 16 % — ABNORMAL HIGH (ref 11.5–15.5)
WBC: 10.3 10*3/uL (ref 4.0–10.5)
nRBC: 0 % (ref 0.0–0.2)

## 2022-05-22 LAB — URINALYSIS, ROUTINE W REFLEX MICROSCOPIC
Bilirubin Urine: NEGATIVE
Glucose, UA: 50 mg/dL — AB
Hgb urine dipstick: NEGATIVE
Ketones, ur: NEGATIVE mg/dL
Leukocytes,Ua: NEGATIVE
Nitrite: NEGATIVE
Protein, ur: >=300 mg/dL — AB
Specific Gravity, Urine: 1.008 (ref 1.005–1.030)
pH: 8 (ref 5.0–8.0)

## 2022-05-22 LAB — I-STAT BETA HCG BLOOD, ED (MC, WL, AP ONLY): I-stat hCG, quantitative: <5 m[IU]/mL (ref <5)

## 2022-05-22 LAB — TROPONIN I (HIGH SENSITIVITY): Troponin I (High Sensitivity): 97 ng/L — ABNORMAL HIGH (ref <18)

## 2022-05-22 LAB — BRAIN NATRIURETIC PEPTIDE: B Natriuretic Peptide: 613.1 pg/mL — ABNORMAL HIGH (ref 0.0–100.0)

## 2022-05-22 LAB — MAGNESIUM: Magnesium: 2.1 mg/dL (ref 1.7–2.4)

## 2022-05-22 MED ORDER — FUROSEMIDE 10 MG/ML IJ SOLN
60.0000 mg | Freq: Once | INTRAMUSCULAR | Status: AC
  Administered 2022-05-22: 60 mg via INTRAVENOUS
  Filled 2022-05-22: qty 8

## 2022-05-22 MED ORDER — POTASSIUM CHLORIDE CRYS ER 20 MEQ PO TBCR
40.0000 meq | EXTENDED_RELEASE_TABLET | Freq: Once | ORAL | Status: AC
  Administered 2022-05-22: 40 meq via ORAL
  Filled 2022-05-22: qty 2

## 2022-05-22 MED ORDER — NICARDIPINE HCL IN NACL 20-0.86 MG/200ML-% IV SOLN
3.0000 mg/h | INTRAVENOUS | Status: DC
  Administered 2022-05-22: 5 mg/h via INTRAVENOUS
  Administered 2022-05-23 (×2): 3 mg/h via INTRAVENOUS
  Filled 2022-05-22 (×4): qty 200

## 2022-05-22 MED ORDER — LABETALOL HCL 5 MG/ML IV SOLN
10.0000 mg | Freq: Once | INTRAVENOUS | Status: AC
  Administered 2022-05-22: 10 mg via INTRAVENOUS
  Filled 2022-05-22: qty 4

## 2022-05-22 NOTE — ED Provider Triage Note (Signed)
Emergency Medicine Provider Triage Evaluation Note  DERECK NEENAN , a 42 y.o. male  was evaluated in triage.  Pt complains of SOB over the past two-three days. The patient reports he has been compliant with all of his medications. Reports some chest pain and well as swelling in his abdomen and legs.  Review of Systems  Positive:  Negative:   Physical Exam  BP (!) 255/169   Pulse (!) 103   Temp 98.9 F (37.2 C)   Resp (!) 24   SpO2 97%  Gen:   Awake, no distress   Resp:  diminished breath sounds MSK:    Other:    Medical Decision Making  Medically screening exam initiated at 9:30 PM.  Appropriate orders placed.  Deep DOMETRIUS DUFOUR was informed that the remainder of the evaluation will be completed by another provider, this initial triage assessment does not replace that evaluation, and the importance of remaining in the ED until their evaluation is complete.  Labs ordered. Nursing aware that patient needs to be roomed immediately.    Sherrell Puller, [place] 05/22/22 2131

## 2022-05-22 NOTE — ED Provider Notes (Signed)
[place] [facility] Provider Note   CSN: [id number] Arrival date & time: 05/22/22  2056     History  Chief Complaint  Patient presents with  . Shortness of Breath    Micheal Levy is a 42 y.o. male with hx of CHF, severe HTN, DMT2, and CVA wh opresents with concern for intermittent now progressive BLE edema and SOB. DOE and orthopnea, no fevers or chills, no cough. No CP or palpitations. States he has been compliant with all BP meds, however also states he only took hydralazine and fluid pill today, did not take amlodipine or carvedilol today, unclear reason. No anticoagulation.  Echo 05/2021 with LVEF of 70-75%, ? Diastolic dysfunction.  Exquisitely hypertensive on intake, level 5 caveat due to acuity of presentation upon arrival.   HPI     Home Medications Prior to Admission medications   Medication Sig Start Date End Date Taking? Authorizing Provider  amLODipine (NORVASC) 10 MG tablet Take 10 mg by mouth daily.   Yes [provider]  carvedilol (COREG) 25 MG tablet Take 25 mg by mouth 2 (two) times daily with a meal.   Yes [provider]  cloNIDine (CATAPRES) 0.3 MG tablet Take 1 tablet (0.3 mg total) by mouth 3 (three) times daily. 06/08/21  Yes Eugenie Filler, MD  hydrALAZINE (APRESOLINE) 100 MG tablet Take 1 tablet (100 mg total) by mouth every 8 (eight) hours. 06/24/21  Yes [name], Kardie, DO  insulin glargine (LANTUS SOLOSTAR) 100 UNIT/ML Solostar Pen Inject 5 Units into the skin daily at 10 pm. 06/08/21 06/08/22 Yes Eugenie Filler, MD  isosorbide mononitrate (IMDUR) 120 MG 24 hr tablet Take 1 tablet (120 mg total) by mouth daily. 06/09/21  Yes Eugenie Filler, MD  losartan (COZAAR) 50 MG tablet Take 50 mg by mouth daily. 12/04/21  Yes [provider]  Multiple Vitamins-Minerals (MULTIVITAMIN ADULTS) TABS Take 1 tablet by mouth daily.   Yes [provider]  torsemide (DEMADEX) 20 MG tablet Take 80 mg  by mouth daily.   Yes [provider]  albuterol (VENTOLIN HFA) 108 (90 Base) MCG/ACT inhaler Inhale 2 puffs into the lungs every 6 (six) hours as needed for wheezing or shortness of breath. Patient not taking: Reported on 05/22/2022 04/19/19   Azzie Glatter, FNP  aspirin EC 81 MG tablet Take 1 tablet (81 mg total) by mouth daily. Patient not taking: Reported on 12/22/2021 06/08/21   Eugenie Filler, MD  Blood Glucose Monitoring Suppl (TRUE METRIX AIR GLUCOSE METER) DEVI 1 each by Does not apply route 4 (four) times daily -  before meals and at bedtime. 04/19/19   Azzie Glatter, FNP  glipiZIDE (GLUCOTROL XL) 10 MG 24 hr tablet Take 1 tablet (10 mg total) by mouth daily with breakfast. Patient not taking: Reported on 05/22/2022 06/08/21   Eugenie Filler, MD  glucose blood (TRUE METRIX BLOOD GLUCOSE TEST) test strip Use as instructed Patient taking differently: 1 each by Other route as directed. 04/19/19   Azzie Glatter, FNP  Insulin Pen Needle (PEN NEEDLES) 31G X 6 MM MISC 1 each by Does not apply route at bedtime. 10/05/17   Dorena Dew, FNP  INSULIN SYRINGE .5CC/29G (B-D INS SYR ULTRAFINE .5CC/29G) 29G X 1/2" 0.5 ML MISC 1 each by Does not apply route at bedtime. 04/02/17   Dorena Dew, FNP  Lancets MISC 1 each by Does not apply route 4 (four) times daily -  before meals and at bedtime. 04/19/19   Azzie Glatter, FNP  senna-docusate (SENOKOT-S) 8.6-50 MG tablet Take 1 tablet by mouth at bedtime as needed for mild constipation. Patient not taking: Reported on 05/22/2022 12/25/21   Hosie Poisson, MD  torsemide (DEMADEX) 20 MG tablet Take 4 tablets (80 mg total) by mouth daily. 12/25/21 03/25/22  Hosie Poisson, MD      Allergies    Patient has no known allergies.    Review of Systems   Review of Systems  Constitutional:  Positive for fever. Negative for activity change, appetite change and fatigue.  HENT: Negative.    Eyes: Negative.   Respiratory:  Positive for  shortness of breath. Negative for chest tightness.   Cardiovascular:  Positive for leg swelling. Negative for chest pain and palpitations.  Gastrointestinal: Negative.   Genitourinary: Negative.   Musculoskeletal: Negative.   Neurological: Negative.  Negative for dizziness, light-headedness and headaches.    Physical Exam Updated Vital Signs BP (!) 167/106   Pulse 91   Temp 98.9 F (37.2 C)   Resp (!) 21   SpO2 96%  Physical Exam Vitals and nursing note reviewed.  Constitutional:      Appearance: He is obese. He is not ill-appearing or toxic-appearing.  HENT:     Head: Normocephalic and atraumatic.     Mouth/Throat:     Mouth: Mucous membranes are moist.     Pharynx: No oropharyngeal exudate or posterior oropharyngeal erythema.  Eyes:     General:        Right eye: No discharge.        Left eye: No discharge.     Extraocular Movements: Extraocular movements intact.     Conjunctiva/sclera: Conjunctivae normal.     Pupils: Pupils are equal, round, and reactive to light.  Cardiovascular:     Rate and Rhythm: Normal rate and regular rhythm.     Pulses: Normal pulses.     Heart sounds: No murmur heard. Pulmonary:     Effort: Pulmonary effort is normal. No respiratory distress.     Breath sounds: Examination of the left-lower field reveals rales. Rales present. No wheezing.  Chest:     Chest wall: No mass, tenderness or edema.  Abdominal:     General: Bowel sounds are normal. There is no distension.     Tenderness: There is no abdominal tenderness.  Musculoskeletal:        General: No deformity.     Cervical back: Neck supple.     Right lower leg: 2+ Pitting Edema present.     Left lower leg: 2+ Pitting Edema present.  Skin:    General: Skin is warm and dry.     Capillary Refill: Capillary refill takes less than 2 seconds.  Neurological:     Mental Status: He is alert. Mental status is at baseline.  Psychiatric:        Mood and Affect: Mood normal.     ED Results  / Procedures / Treatments   Labs (all labs ordered are listed, but only abnormal results are displayed) Labs Reviewed  BRAIN NATRIURETIC PEPTIDE - Abnormal; Notable for the following components:      Result Value   B Natriuretic Peptide 613.1 (*)    All other components within normal limits  CBC WITH DIFFERENTIAL/PLATELET - Abnormal; Notable for the following components:   Hemoglobin 12.8 (*)    RDW 16.0 (*)    All other components within normal limits  COMPREHENSIVE METABOLIC PANEL -  Abnormal; Notable for the following components:   Potassium 2.7 (*)    Glucose, Bld 158 (*)    BUN 24 (*)    Creatinine, Ser 3.55 (*)    Calcium 8.5 (*)    Albumin 2.8 (*)    GFR, Estimated 21 (*)    All other components within normal limits  URINALYSIS, ROUTINE W REFLEX MICROSCOPIC - Abnormal; Notable for the following components:   Glucose, UA 50 (*)    Protein, ur >=300 (*)    Bacteria, UA RARE (*)    All other components within normal limits  TROPONIN I (HIGH SENSITIVITY) - Abnormal; Notable for the following components:   Troponin I (High Sensitivity) 97 (*)    All other components within normal limits  MAGNESIUM  I-STAT BETA HCG BLOOD, ED ([facility], [facility], [facility] ONLY)  TROPONIN I (HIGH SENSITIVITY)    EKG EKG Interpretation  Date/Time:  Thursday May 22 2022 21:21:27 EST Ventricular Rate:  101 PR Interval:  164 QRS Duration: 90 QT Interval:  392 QTC Calculation: 509 R Axis:   7 Text Interpretation: Sinus tachycardia Left atrial enlargement Probable left ventricular hypertrophy Abnormal T, consider ischemia, lateral leads Prolonged QT interval Confirmed by Dory Horn) on 05/22/2022 11:42:09 PM  Radiology DG Chest 2 View  Result Date: 05/22/2022 CLINICAL DATA:  Shortness of breath EXAM: CHEST - 2 VIEW COMPARISON:  12/22/2021 FINDINGS: Cardiomegaly with vascular congestion, small pleural effusions and pulmonary edema. No consolidation or pneumothorax. IMPRESSION: Cardiomegaly with  vascular congestion, small pleural effusions and pulmonary edema. Electronically Signed   By: Donavan Foil M.D.   On: 05/22/2022 21:37    Procedures .Critical Care  Performed by: Emeline Darling, PA-C Authorized by: Emeline Darling, PA-C   Critical care provider statement:    Critical care time (minutes):  60   Critical care was time spent personally by me on the following activities:  Development of treatment plan with patient or surrogate, discussions with consultants, evaluation of patient's response to treatment, examination of patient, obtaining history from patient or surrogate, ordering and performing treatments and interventions, ordering and review of laboratory studies, ordering and review of radiographic studies, pulse oximetry and re-evaluation of patient's condition     Medications Ordered in ED Medications  nicardipine (CARDENE) 71m in 0.86% saline 2044mIV infusion (0.1 mg/ml) (10 mg/hr Intravenous Rate/Dose Change 05/23/22 0022)  labetalol (NORMODYNE) injection 10 mg (10 mg Intravenous Given 05/22/22 2234)  potassium chloride SA (KLOR-CON M) CR tablet 40 mEq (40 mEq Oral Given 05/22/22 2347)  furosemide (LASIX) injection 60 mg (60 mg Intravenous Given 05/22/22 2347)    ED Course/ Medical Decision Making/ A&P Clinical Course as of 05/23/22 0311  Fri May 23, 2022  0016 Consult to PCCM Dr. MaRuthann Cancerwho will see the patient in the ED. I appreciate her collaboration in the care of this patient.  [RS]  [id number] result, troponin increased to 103, likely demand from hypertensive crisis. Patient remains CP free at this time.  [RS]  0310 Call from PCCM Dr. MaRuthann Cancerho is requesting I assist in placing admission orders for this patient. Orders placed. I appreciate her collaboration in the care of this patient.  [RS]    Clinical Course User Index [RS] SpYvonne KendalleGypsy BalsamPA-C                             Medical Decision Making [age]ear old male with his of CHF  who presents with SOB and BLE edema worsening over the past few weeks.   HTN 255/169 on intake, tachypnea, VS otherwise normal. Cardiac exam unremarkable, pulmonary exam with mild rales in the bases. BLE edema as above.   DDX includes but is not limited to HTN emergency, CHF, ACS, PE.   MAP of 193 on intake, goal MAP 154-173 in the first hour, decrease MAP 173-144 in the first 24 hours if end organ dysfunction. Labetalol administered IV with improvement to MAP of 173 at this time.  Amount and/or Complexity of Data Reviewed Labs: ordered.    Details: CBC without leukocytosis, mild anemia with hemoglobin of 12.8 near patient's baseline.  CMP with critical hypokalemia of 2.7, mag is 2.1.  AKI with creatinine of 3.5 increased from patient's baseline of 2.5, troponin of 97 increased from baseline near 40, BNP patient's baseline 09/06/2011. Radiology: ordered.    Details: Chest x-ray with cardiomegaly with vascular congestion and small pleural effusions, pulmonary edema, visualized by this provider.  ECG/medicine tests:     Details: EKG with sinus tachycardia, prolonged QT, no STEMI.  Risk Prescription drug management. Decision regarding hospitalization.   Patient with AKI, elevated troponin - patient responded well to initial dose of labetalol with improvement in MAP with decrease by 10% from intake. HR was in the 70s following labetalol. Given laboratory changes with AKI and troponin elevation, and after discussion with EDP Dr. Randal Buba will start patient on cardene drip. Will also proceed with lasix and potassium repletion at this time.   Consult to PCCM as above, patient's pressure improving on cardene drip. Will require admission to the hospital for hypertensive emergency.   Micheal Levy and his wife voiced understanding of her medical evaluation and treatment plan. Each of their questions answered to their expressed satisfaction.  He is amenable to plan at this time.   This chart was dictated  using voice recognition software, Dragon. Despite the best efforts of this provider to proofread and correct errors, errors may still occur which can change documentation meaning.   Final Clinical Impression(s) / ED Diagnoses Final diagnoses:  Hypertensive emergency    Rx / DC Orders ED Discharge Orders     None         Aura Dials 05/23/22 0055    Palumbo, April, MD 05/23/22 0109

## 2022-05-22 NOTE — ED Triage Notes (Signed)
States he was sick about a month ago and noticed his legs started retaining fluid. Now complaints of shortness of breath. Declines any chest pain.

## 2022-05-23 ENCOUNTER — Inpatient Hospital Stay (HOSPITAL_COMMUNITY): Payer: Medicaid Other

## 2022-05-23 ENCOUNTER — Other Ambulatory Visit: Payer: Self-pay

## 2022-05-23 DIAGNOSIS — I130 Hypertensive heart and chronic kidney disease with heart failure and stage 1 through stage 4 chronic kidney disease, or unspecified chronic kidney disease: Secondary | ICD-10-CM | POA: Diagnosis present

## 2022-05-23 DIAGNOSIS — I2489 Other forms of acute ischemic heart disease: Secondary | ICD-10-CM

## 2022-05-23 DIAGNOSIS — I10 Essential (primary) hypertension: Secondary | ICD-10-CM | POA: Diagnosis not present

## 2022-05-23 DIAGNOSIS — Z8249 Family history of ischemic heart disease and other diseases of the circulatory system: Secondary | ICD-10-CM | POA: Diagnosis not present

## 2022-05-23 DIAGNOSIS — E876 Hypokalemia: Secondary | ICD-10-CM | POA: Diagnosis present

## 2022-05-23 DIAGNOSIS — Z794 Long term (current) use of insulin: Secondary | ICD-10-CM | POA: Diagnosis not present

## 2022-05-23 DIAGNOSIS — Z91148 Patient's other noncompliance with medication regimen for other reason: Secondary | ICD-10-CM | POA: Diagnosis not present

## 2022-05-23 DIAGNOSIS — Z87891 Personal history of nicotine dependence: Secondary | ICD-10-CM | POA: Diagnosis not present

## 2022-05-23 DIAGNOSIS — Z79899 Other long term (current) drug therapy: Secondary | ICD-10-CM | POA: Diagnosis not present

## 2022-05-23 DIAGNOSIS — Z8673 Personal history of transient ischemic attack (TIA), and cerebral infarction without residual deficits: Secondary | ICD-10-CM | POA: Diagnosis not present

## 2022-05-23 DIAGNOSIS — Z833 Family history of diabetes mellitus: Secondary | ICD-10-CM | POA: Diagnosis not present

## 2022-05-23 DIAGNOSIS — D6489 Other specified anemias: Secondary | ICD-10-CM | POA: Diagnosis present

## 2022-05-23 DIAGNOSIS — N1832 Chronic kidney disease, stage 3b: Secondary | ICD-10-CM | POA: Diagnosis present

## 2022-05-23 DIAGNOSIS — E1122 Type 2 diabetes mellitus with diabetic chronic kidney disease: Secondary | ICD-10-CM | POA: Diagnosis present

## 2022-05-23 DIAGNOSIS — E559 Vitamin D deficiency, unspecified: Secondary | ICD-10-CM | POA: Diagnosis present

## 2022-05-23 DIAGNOSIS — Z6839 Body mass index (BMI) 39.0-39.9, adult: Secondary | ICD-10-CM | POA: Diagnosis not present

## 2022-05-23 DIAGNOSIS — N179 Acute kidney failure, unspecified: Secondary | ICD-10-CM | POA: Diagnosis present

## 2022-05-23 DIAGNOSIS — R7989 Other specified abnormal findings of blood chemistry: Secondary | ICD-10-CM | POA: Diagnosis present

## 2022-05-23 DIAGNOSIS — E669 Obesity, unspecified: Secondary | ICD-10-CM | POA: Diagnosis present

## 2022-05-23 DIAGNOSIS — D631 Anemia in chronic kidney disease: Secondary | ICD-10-CM | POA: Diagnosis present

## 2022-05-23 DIAGNOSIS — Z7984 Long term (current) use of oral hypoglycemic drugs: Secondary | ICD-10-CM | POA: Diagnosis not present

## 2022-05-23 DIAGNOSIS — E1165 Type 2 diabetes mellitus with hyperglycemia: Secondary | ICD-10-CM | POA: Diagnosis present

## 2022-05-23 DIAGNOSIS — I161 Hypertensive emergency: Secondary | ICD-10-CM

## 2022-05-23 DIAGNOSIS — I5033 Acute on chronic diastolic (congestive) heart failure: Secondary | ICD-10-CM | POA: Diagnosis present

## 2022-05-23 DIAGNOSIS — Z7982 Long term (current) use of aspirin: Secondary | ICD-10-CM | POA: Diagnosis not present

## 2022-05-23 LAB — GLUCOSE, CAPILLARY
Glucose-Capillary: 122 mg/dL — ABNORMAL HIGH (ref 70–99)
Glucose-Capillary: 139 mg/dL — ABNORMAL HIGH (ref 70–99)
Glucose-Capillary: 140 mg/dL — ABNORMAL HIGH (ref 70–99)
Glucose-Capillary: 180 mg/dL — ABNORMAL HIGH (ref 70–99)
Glucose-Capillary: 187 mg/dL — ABNORMAL HIGH (ref 70–99)
Glucose-Capillary: 221 mg/dL — ABNORMAL HIGH (ref 70–99)

## 2022-05-23 LAB — ECHOCARDIOGRAM LIMITED
Calc EF: 55.3 %
Height: 68.5 in
S' Lateral: 3.2 cm
Single Plane A2C EF: 54.1 %
Single Plane A4C EF: 54.1 %
Weight: 4123.48 oz

## 2022-05-23 LAB — BASIC METABOLIC PANEL
Anion gap: 11 (ref 5–15)
Anion gap: 11 (ref 5–15)
BUN: 24 mg/dL — ABNORMAL HIGH (ref 6–20)
BUN: 26 mg/dL — ABNORMAL HIGH (ref 6–20)
CO2: 27 mmol/L (ref 22–32)
CO2: 25 mmol/L (ref 22–32)
Calcium: 8.5 mg/dL — ABNORMAL LOW (ref 8.9–10.3)
Calcium: 8.2 mg/dL — ABNORMAL LOW (ref 8.9–10.3)
Chloride: 102 mmol/L (ref 98–111)
Chloride: 105 mmol/L (ref 98–111)
Creatinine, Ser: 3.4 mg/dL — ABNORMAL HIGH (ref 0.61–1.24)
Creatinine, Ser: 3.49 mg/dL — ABNORMAL HIGH (ref 0.61–1.24)
GFR, Estimated: 22 mL/min — ABNORMAL LOW (ref >60)
GFR, Estimated: 21 mL/min — ABNORMAL LOW (ref >60)
Glucose, Bld: 219 mg/dL — ABNORMAL HIGH (ref 70–99)
Glucose, Bld: 119 mg/dL — ABNORMAL HIGH (ref 70–99)
Potassium: 2.7 mmol/L — CL (ref 3.5–5.1)
Potassium: 3.1 mmol/L — ABNORMAL LOW (ref 3.5–5.1)
Sodium: 140 mmol/L (ref 135–145)
Sodium: 141 mmol/L (ref 135–145)

## 2022-05-23 LAB — TROPONIN I (HIGH SENSITIVITY)
Troponin I (High Sensitivity): 103 ng/L (ref <18)
Troponin I (High Sensitivity): 127 ng/L (ref <18)
Troponin I (High Sensitivity): 139 ng/L (ref <18)

## 2022-05-23 LAB — RAPID URINE DRUG SCREEN, HOSP PERFORMED
Amphetamines: NOT DETECTED
Barbiturates: NOT DETECTED
Benzodiazepines: NOT DETECTED
Cocaine: NOT DETECTED
Opiates: NOT DETECTED
Tetrahydrocannabinol: NOT DETECTED

## 2022-05-23 LAB — MRSA NEXT GEN BY PCR, NASAL: MRSA by PCR Next Gen: NOT DETECTED

## 2022-05-23 MED ORDER — OXYMETAZOLINE HCL 0.05 % NA SOLN
1.0000 | Freq: Two times a day (BID) | NASAL | Status: DC
  Administered 2022-05-24 – 2022-05-25 (×4): 1 via NASAL
  Filled 2022-05-23 (×2): qty 15

## 2022-05-23 MED ORDER — POTASSIUM CHLORIDE CRYS ER 20 MEQ PO TBCR
40.0000 meq | EXTENDED_RELEASE_TABLET | Freq: Once | ORAL | Status: AC
  Administered 2022-05-23: 40 meq via ORAL
  Filled 2022-05-23: qty 2

## 2022-05-23 MED ORDER — TORSEMIDE 20 MG PO TABS
80.0000 mg | ORAL_TABLET | Freq: Every day | ORAL | Status: DC
  Administered 2022-05-23 – 2022-05-24 (×2): 80 mg via ORAL
  Filled 2022-05-23 (×3): qty 4

## 2022-05-23 MED ORDER — HEPARIN SODIUM (PORCINE) 5000 UNIT/ML IJ SOLN
5000.0000 [IU] | Freq: Three times a day (TID) | INTRAMUSCULAR | Status: DC
  Administered 2022-05-23 – 2022-05-29 (×20): 5000 [IU] via SUBCUTANEOUS
  Filled 2022-05-23 (×20): qty 1

## 2022-05-23 MED ORDER — ORAL CARE MOUTH RINSE: 15.0000 mL | OROMUCOSAL | Status: DC | PRN

## 2022-05-23 MED ORDER — AMLODIPINE BESYLATE 10 MG PO TABS
10.0000 mg | ORAL_TABLET | Freq: Every day | ORAL | Status: DC
  Administered 2022-05-23 – 2022-05-30 (×8): 10 mg via ORAL
  Filled 2022-05-23 (×8): qty 1

## 2022-05-23 MED ORDER — LABETALOL HCL 5 MG/ML IV SOLN: 10.0000 mg | INTRAVENOUS | Status: DC | PRN

## 2022-05-23 MED ORDER — FLUTICASONE PROPIONATE 50 MCG/ACT NA SUSP
2.0000 | Freq: Two times a day (BID) | NASAL | Status: DC
  Administered 2022-05-23 – 2022-05-30 (×8): 2 via NASAL
  Filled 2022-05-23 (×2): qty 16

## 2022-05-23 MED ORDER — INSULIN ASPART 100 UNIT/ML IJ SOLN
1.0000 [IU] | INTRAMUSCULAR | Status: DC
  Administered 2022-05-23: 2 [IU] via SUBCUTANEOUS
  Administered 2022-05-23: 1 [IU] via SUBCUTANEOUS
  Administered 2022-05-23: 2 [IU] via SUBCUTANEOUS
  Administered 2022-05-23 – 2022-05-24 (×5): 1 [IU] via SUBCUTANEOUS

## 2022-05-23 MED ORDER — ASPIRIN 81 MG PO TBEC
81.0000 mg | DELAYED_RELEASE_TABLET | Freq: Every day | ORAL | Status: DC
  Administered 2022-05-23 – 2022-05-30 (×8): 81 mg via ORAL
  Filled 2022-05-23 (×8): qty 1

## 2022-05-23 MED ORDER — HYDRALAZINE HCL 50 MG PO TABS
100.0000 mg | ORAL_TABLET | Freq: Three times a day (TID) | ORAL | Status: DC
  Administered 2022-05-23 – 2022-05-30 (×23): 100 mg via ORAL
  Filled 2022-05-23 (×23): qty 2

## 2022-05-23 MED ORDER — CLONIDINE HCL 0.2 MG PO TABS
0.3000 mg | ORAL_TABLET | Freq: Three times a day (TID) | ORAL | Status: DC
  Administered 2022-05-23 – 2022-05-30 (×22): 0.3 mg via ORAL
  Filled 2022-05-23 (×22): qty 1

## 2022-05-23 MED ORDER — ISOSORBIDE MONONITRATE ER 60 MG PO TB24
120.0000 mg | ORAL_TABLET | Freq: Every day | ORAL | Status: DC
  Administered 2022-05-23 – 2022-05-30 (×8): 120 mg via ORAL
  Filled 2022-05-23 (×8): qty 2

## 2022-05-23 MED ORDER — CARVEDILOL 25 MG PO TABS
25.0000 mg | ORAL_TABLET | Freq: Two times a day (BID) | ORAL | Status: DC
  Administered 2022-05-23 – 2022-05-30 (×15): 25 mg via ORAL
  Filled 2022-05-23 (×6): qty 1
  Filled 2022-05-23: qty 2
  Filled 2022-05-23: qty 1
  Filled 2022-05-23: qty 2
  Filled 2022-05-23 (×6): qty 1

## 2022-05-23 MED ORDER — CHLORHEXIDINE GLUCONATE CLOTH 2 % EX PADS
6.0000 | MEDICATED_PAD | Freq: Every day | CUTANEOUS | Status: DC
  Administered 2022-05-23: 6 via TOPICAL

## 2022-05-23 NOTE — H&P (Signed)
NAME:  Micheal SLUYTER, MRN:  [id number], DOB:  [birth year], LOS: 0 ADMISSION DATE:  05/22/2022, CONSULTATION DATE:  2/16 REFERRING MD:  edp, CHIEF COMPLAINT:  htn   History of Present Illness:  42 yo with chronic poorly controlled htn, t2dm presented with htn. Pt endorses that he has been taking his home bp medications. He had similar event this time last year. This correlates with the passing of his mother in 2023. He as recently noted that he has been retaining more fluid in his legs, no recent mediucation changes. Denies sob/cp/ha/syncope or pre syncope/no ha.   In ed his BP was [id number] systolic and he was given 10 labetalol with ~20% improval but then was initated on a gtt rather than resume home meds and cont with prn. At this time he is in our ICU on 3 of cardene and I have resumed home meds and added prn.   Pt endorses overall improvement since arrival  Pertinent  Medical History  As per hpi  Significant Hospital Events: Including procedures, antibiotic start and stop dates in addition to other pertinent events   Admitted on cardene gtt  Interim History / Subjective:    Objective   Blood pressure (!) 188/121, pulse 98, temperature 98.2 F (36.8 C), temperature source Oral, resp. rate (!) 29, height 5' 8.5" (1.74 m), weight 116.9 kg, SpO2 92 %.        Intake/Output Summary (Last 24 hours) at 05/23/2022 [id number] Last data filed at 05/23/2022 0557 Gross per 24 hour  Intake 256.25 ml  Output 1245 ml  Net -988.75 ml   Filed Weights   05/23/22 0412  Weight: 116.9 kg    Examination: General: aaox3 nad sitting up in bed watching tv HENT: ncat eomi, perrla, mmmp Lungs: ctab Cardiovascular: rrr Abdomen: obese, nt  nd bs+ Extremities: + edema b/l le, no c/c Neuro: no focal deficits GU: deferred  Resolved Hospital Problem list     Assessment & Plan:  Hypertension emergency:  Elevated trop Aki on ckd3b Elevated bnp -elevated trop -increased cr (baseline 2.7 ) -suspect that  pt was under dosed with prn and oral meds prior to initiation of gtt.  -started oral meds from home (with exception of arb) and added prn labetalol to wean off his gtt -trend trop and monitor renal funtion -limited echo for wall motion abnormalities -check uds (however never positive before) -replace K  T2dm:  ssi  Best Practice (right click and "Reselect all SmartList Selections" daily)   Diet/type: Regular consistency (see orders) DVT prophylaxis: prophylactic heparin  GI prophylaxis: N/A Lines: N/A Foley:  N/A Code Status:  full code Last date of multidisciplinary goals of care discussion [with pt 12/16]  Labs   CBC: Recent Labs  Lab 05/22/22 2217  WBC 10.3  NEUTROABS 7.7  HGB 12.8*  HCT 41.3  MCV 85.3  PLT 0000000    Basic Metabolic Panel: Recent Labs  Lab 05/22/22 2217  NA 140  K 2.7*  CL 101  CO2 27  GLUCOSE 158*  BUN 24*  CREATININE 3.55*  CALCIUM 8.5*  MG 2.1   GFR: Estimated Creatinine Clearance: 33.6 mL/min (A) (by C-G formula based on SCr of 3.55 mg/dL (H)). Recent Labs  Lab 05/22/22 2217  WBC 10.3    Liver Function Tests: Recent Labs  Lab 05/22/22 2217  AST 23  ALT 16  ALKPHOS 65  BILITOT 1.0  PROT 6.5  ALBUMIN 2.8*   No results for input(s): "LIPASE", "AMYLASE" in the last  168 hours. No results for input(s): "AMMONIA" in the last 168 hours.  ABG    Component Value Date/Time   PHART 7.312 (L) 05/18/2021 0350   PCO2ART 46.7 05/18/2021 0350   PO2ART 73.7 (L) 05/18/2021 0350   HCO3 26.8 05/31/2021 1418   TCO2 28 05/31/2021 1418   ACIDBASEDEF 2.8 (H) 05/18/2021 0350   O2SAT 65 05/31/2021 1418     Coagulation Profile: No results for input(s): "INR", "PROTIME" in the last 168 hours.  Cardiac Enzymes: No results for input(s): "CKTOTAL", "CKMB", "CKMBINDEX", "TROPONINI" in the last 168 hours.  HbA1C: Hgb A1c MFr Bld  Date/Time Value Ref Range Status  12/23/2021 04:07 AM 8.3 (H) 4.8 - 5.6 % Final    Comment:    (NOTE) Pre  diabetes:          5.7%-6.4%  Diabetes:              >6.4%  Glycemic control for   <7.0% adults with diabetes   05/21/2021 03:15 AM 6.6 (H) 4.8 - 5.6 % Final    Comment:    (NOTE) Pre diabetes:          5.7%-6.4%  Diabetes:              >6.4%  Glycemic control for   <7.0% adults with diabetes     CBG: Recent Labs  Lab 05/23/22 0555  GLUCAP 221*    Review of Systems:   As per HPI  Past Medical History:  He,  has a past medical history of Chronic kidney disease, Diabetes mellitus without complication ([place]), Elevated serum cholesterol (04/2019), Elevated serum creatinine (04/2019), Hypertension, Hypertensive urgency (04/2019), Seizures ([place]) (03/2017), Stroke ([place]) (03/2017), Substance abuse ([place]), and Vitamin D deficiency (04/2019).   Surgical History:   Past Surgical History:  Procedure Laterality Date   RIGHT HEART CATH N/A 05/31/2021   Procedure: RIGHT HEART CATH;  Surgeon: Jettie Booze, MD;  Location: [place] [facility];  Service: Cardiovascular;  Laterality: N/A;     Social History:   reports that he quit smoking about 23 years ago. His smoking use included cigarettes. He has never used smokeless tobacco. He reports current alcohol use. He reports current drug use. Drugs: Cocaine and Marijuana.   Family History:  His family history includes Diabetes in his father and sister; Heart attack in his father; Hypertension in his brother.   Allergies No Known Allergies   Home Medications  Prior to Admission medications   Medication Sig Start Date End Date Taking? Authorizing Provider  amLODipine (NORVASC) 10 MG tablet Take 10 mg by mouth daily.   Yes [provider]  carvedilol (COREG) 25 MG tablet Take 25 mg by mouth 2 (two) times daily with a meal.   Yes [provider]  cloNIDine (CATAPRES) 0.3 MG tablet Take 1 tablet (0.3 mg total) by mouth 3 (three) times daily. 06/08/21  Yes Eugenie Filler, MD  hydrALAZINE (APRESOLINE) 100 MG tablet  Take 1 tablet (100 mg total) by mouth every 8 (eight) hours. 06/24/21  Yes [name], Kardie, DO  insulin glargine (LANTUS SOLOSTAR) 100 UNIT/ML Solostar Pen Inject 5 Units into the skin daily at 10 pm. 06/08/21 06/08/22 Yes Eugenie Filler, MD  isosorbide mononitrate (IMDUR) 120 MG 24 hr tablet Take 1 tablet (120 mg total) by mouth daily. 06/09/21  Yes Eugenie Filler, MD  losartan (COZAAR) 50 MG tablet Take 50 mg by mouth daily. 12/04/21  Yes [provider]  Multiple Vitamins-Minerals (MULTIVITAMIN ADULTS)  TABS Take 1 tablet by mouth daily.   Yes [provider]  torsemide (DEMADEX) 20 MG tablet Take 80 mg by mouth daily.   Yes [provider]  albuterol (VENTOLIN HFA) 108 (90 Base) MCG/ACT inhaler Inhale 2 puffs into the lungs every 6 (six) hours as needed for wheezing or shortness of breath. Patient not taking: Reported on 05/22/2022 04/19/19   Azzie Glatter, FNP  aspirin EC 81 MG tablet Take 1 tablet (81 mg total) by mouth daily. Patient not taking: Reported on 12/22/2021 06/08/21   Eugenie Filler, MD  Blood Glucose Monitoring Suppl (TRUE METRIX AIR GLUCOSE METER) DEVI 1 each by Does not apply route 4 (four) times daily -  before meals and at bedtime. 04/19/19   Azzie Glatter, FNP  glipiZIDE (GLUCOTROL XL) 10 MG 24 hr tablet Take 1 tablet (10 mg total) by mouth daily with breakfast. Patient not taking: Reported on 05/22/2022 06/08/21   Eugenie Filler, MD  glucose blood (TRUE METRIX BLOOD GLUCOSE TEST) test strip Use as instructed Patient taking differently: 1 each by Other route as directed. 04/19/19   Azzie Glatter, FNP  Insulin Pen Needle (PEN NEEDLES) 31G X 6 MM MISC 1 each by Does not apply route at bedtime. 10/05/17   Dorena Dew, FNP  INSULIN SYRINGE .5CC/29G (B-D INS SYR ULTRAFINE .5CC/29G) 29G X 1/2" 0.5 ML MISC 1 each by Does not apply route at bedtime. 04/02/17   Dorena Dew, FNP  Lancets MISC 1 each by Does not apply route 4 (four) times daily  -  before meals and at bedtime. 04/19/19   Azzie Glatter, FNP  senna-docusate (SENOKOT-S) 8.6-50 MG tablet Take 1 tablet by mouth at bedtime as needed for mild constipation. Patient not taking: Reported on 05/22/2022 12/25/21   Hosie Poisson, MD  torsemide (DEMADEX) 20 MG tablet Take 4 tablets (80 mg total) by mouth daily. 12/25/21 03/25/22  Hosie Poisson, MD     Critical care time: 37 min cc

## 2022-05-23 NOTE — Progress Notes (Signed)
PCCM interval note  42 yo admitted for hypertensive crisis requiring a cardene infusion and thus ICU admission.   Was restarted on oral antihypertensives this morning and has since been weaned off of cardene gtt. K has been replaced.   Repeat BMP with K 3.1, Cr 3.29  UDS neg  ECHO is pending completion   Pt reports feeling much better than last night, and has improved HA.  Eating lunch.   WDWN M NAD NCAT CTAb even unlabored RRR cap refill brisk Soft round abd No acute joint deformity Skin c/d/w GU exam deferred    HTN -continue current PO reg -f/u ECHO  -transfer out of ICU   Hypokalemia  -Replace  AKI -trend UOP, renal indices     Eliseo Gum MSN, AGACNP-BC [place] for pager 05/23/2022, 2:26 PM

## 2022-05-23 NOTE — ED Notes (Signed)
Per ponseller, Rebekah R, PA-C, "HOLD" cardene drip for now. Refer to [facility].

## 2022-05-23 NOTE — ED Notes (Signed)
ED TO INPATIENT HANDOFF REPORT  ED Nurse Name and Phone #: [phone] [facility]  S Name/Age/Gender Micheal Levy 42 y.o. male Room/Bed: WA18/WA18  Code Status   Code Status: Prior  Home/SNF/Other Home Patient oriented to: self, place, time, and situation Is this baseline? Yes   Triage Complete: Triage complete  Chief Complaint Hypertensive emergency [I16.1]  Triage Note States he was sick about a month ago and noticed his legs started retaining fluid. Now complaints of shortness of breath. Declines any chest pain.    Allergies No Known Allergies  Level of Care/Admitting Diagnosis ED Disposition     ED Disposition  Admit   Condition  --   Comment  Hospital Area: [place] [[id number]]  Level of Care: ICU [6]  May admit patient to [facility] or [name] if equivalent level of care is available:: Yes  Covid Evaluation: Asymptomatic - no recent exposure (last 10 days) testing not required  Diagnosis: Hypertensive emergency [id number]  Admitting Physician: Audria Nine [id number]  Attending Physician: Audria Nine [id number]  Certification:: I certify this patient will need inpatient services for at least 2 midnights  Estimated Length of Stay: 2          B Medical/Surgery History Past Medical History:  Diagnosis Date   Chronic kidney disease    Diabetes mellitus without complication ([place])    Elevated serum cholesterol 04/2019   Elevated serum creatinine 04/2019   Hypertension    Hypertensive urgency 04/2019   Seizures ([place]) 03/2017   x 2    Stroke ([place]) 03/2017   Substance abuse ([place])    Vitamin D deficiency 04/2019   Past Surgical History:  Procedure Laterality Date   RIGHT HEART CATH N/A 05/31/2021   Procedure: RIGHT HEART CATH;  Surgeon: Jettie Booze, MD;  Location: [facility];  Service: Cardiovascular;  Laterality: N/A;     A IV Location/Drains/Wounds Patient Lines/Drains/Airways Status     Active  Line/Drains/Airways     Name Placement date Placement time Site Days   Peripheral IV 05/23/22 20 G 1" Anterior;Proximal;Right Forearm 05/23/22  0008  Forearm  less than 1   Peripheral IV 05/23/22 20 G 1" Anterior;Left;Proximal Forearm 05/23/22  0312  Forearm  less than 1            Intake/Output Last 24 hours  Intake/Output Summary (Last 24 hours) at 05/23/2022 [id number] Last data filed at 05/23/2022 0123 Gross per 24 hour  Intake --  Output 620 ml  Net -620 ml    Labs/Imaging Results for orders placed or performed during the hospital encounter of 05/22/22 (from the past 48 hour(s))  Brain natriuretic peptide     Status: Abnormal   Collection Time: 05/22/22 10:17 PM  Result Value Ref Range   B Natriuretic Peptide 613.1 (H) 0.0 - 100.0 pg/mL    Comment: Performed at [facility], [place] [address], [place], [place] [address]  CBC with Differential     Status: Abnormal   Collection Time: 05/22/22 10:17 PM  Result Value Ref Range   WBC 10.3 4.0 - 10.5 K/uL   RBC 4.84 4.22 - 5.81 MIL/uL   Hemoglobin 12.8 (L) 13.0 - 17.0 g/dL   HCT 41.3 39.0 - 52.0 %   MCV 85.3 80.0 - 100.0 fL   MCH 26.4 26.0 - 34.0 pg   MCHC 31.0 30.0 - 36.0 g/dL   RDW 16.0 (H) 11.5 - 15.5 %   Platelets 353 150 - 400 K/uL  nRBC 0.0 0.0 - 0.2 %   Neutrophils Relative % 75 %   Neutro Abs 7.7 1.7 - 7.7 K/uL   Lymphocytes Relative 18 %   Lymphs Abs 1.9 0.7 - 4.0 K/uL   Monocytes Relative 5 %   Monocytes Absolute 0.5 0.1 - 1.0 K/uL   Eosinophils Relative 1 %   Eosinophils Absolute 0.1 0.0 - 0.5 K/uL   Basophils Relative 1 %   Basophils Absolute 0.1 0.0 - 0.1 K/uL   Immature Granulocytes 0 %   Abs Immature Granulocytes 0.04 0.00 - 0.07 K/uL    Comment: Performed at [facility], [place] [address] [place], [place] [address]  Comprehensive metabolic panel     Status: Abnormal   Collection Time: 05/22/22 10:17 PM  Result Value Ref Range   Sodium 140 135 - 145 mmol/L   Potassium  2.7 (LL) 3.5 - 5.1 mmol/L    Comment: CRITICAL RESULT CALLED TO, READ BACK BY AND VERIFIED WITH SERRAINOLO,J RN AT 2315 ON 05/22/22 BY [email or web address]    Chloride 101 98 - 111 mmol/L   CO2 27 22 - 32 mmol/L   Glucose, Bld 158 (H) 70 - 99 mg/dL    Comment: Glucose reference range applies only to samples taken after fasting for at least 8 hours.   BUN 24 (H) 6 - 20 mg/dL   Creatinine, Ser 3.55 (H) 0.61 - 1.24 mg/dL   Calcium 8.5 (L) 8.9 - 10.3 mg/dL   Total Protein 6.5 6.5 - 8.1 g/dL   Albumin 2.8 (L) 3.5 - 5.0 g/dL   AST 23 15 - 41 U/L   ALT 16 0 - 44 U/L   Alkaline Phosphatase 65 38 - 126 U/L   Total Bilirubin 1.0 0.3 - 1.2 mg/dL   GFR, Estimated 21 (L) >60 mL/min    Comment: (NOTE) Calculated using the CKD-EPI Creatinine Equation (2021)    Anion gap 12 5 - 15    Comment: Performed at [facility], [place] [address], [place], [place] [address]  Troponin I (High Sensitivity)     Status: Abnormal   Collection Time: 05/22/22 10:17 PM  Result Value Ref Range   Troponin I (High Sensitivity) 97 (H) <18 ng/L    Comment: (NOTE) Elevated high sensitivity troponin I (hsTnI) values and significant  changes across serial measurements may suggest ACS but many other  chronic and acute conditions are known to elevate hsTnI results.  Refer to the "Links" section for chest pain algorithms and additional  guidance. Performed at [facility], [place] [address] [place], [place] [address]   Magnesium     Status: None   Collection Time: 05/22/22 10:17 PM  Result Value Ref Range   Magnesium 2.1 1.7 - 2.4 mg/dL    Comment: Performed at [facility], [place] [address] [place],  [address]  Urinalysis, Routine w reflex microscopic -Urine, Clean Catch     Status: Abnormal   Collection Time: 05/22/22 10:17 PM  Result Value Ref Range   Color, Urine YELLOW YELLOW   APPearance CLEAR CLEAR   Specific Gravity, Urine 1.008 1.005 - 1.030   pH 8.0 5.0 - 8.0    Glucose, UA 50 (A) NEGATIVE mg/dL   Hgb urine dipstick NEGATIVE NEGATIVE   Bilirubin Urine NEGATIVE NEGATIVE   Ketones, ur NEGATIVE NEGATIVE mg/dL   Protein, ur >=300 (A) NEGATIVE mg/dL   Nitrite NEGATIVE NEGATIVE   Leukocytes,Ua NEGATIVE NEGATIVE   RBC / HPF 0-5 0 -  5 RBC/hpf   WBC, UA 0-5 0 - 5 WBC/hpf   Bacteria, UA RARE (A) NONE SEEN   Squamous Epithelial / HPF 0-5 0 - 5 /HPF   Mucus PRESENT     Comment: Performed at [facility], [place] [address], [place], [place] [address]  I-Stat Beta hCG blood, ED ([facility], [facility], [facility] only)     Status: None   Collection Time: 05/22/22 10:23 PM  Result Value Ref Range   I-stat hCG, quantitative <5.0 <5 mIU/mL   Comment 3            Comment:   GEST. AGE      CONC.  (mIU/mL)   <=1 WEEK        5 - 50     2 WEEKS       50 - 500     3 WEEKS       100 - 10,000     4 WEEKS     1,000 - 30,000        MALE AND NON-PREGNANT MALE:     LESS THAN 5 mIU/mL   Troponin I (High Sensitivity)     Status: Abnormal   Collection Time: 05/22/22 11:30 PM  Result Value Ref Range   Troponin I (High Sensitivity) 103 (HH) <18 ng/L    Comment: CRITICAL RESULT CALLED TO, READ BACK BY AND VERIFIED WITH BRATU,D EMTP AT 0057 ON 05/23/22 BY [email or web address] (NOTE) Elevated high sensitivity troponin I (hsTnI) values and significant  changes across serial measurements may suggest ACS but many other  chronic and acute conditions are known to elevate hsTnI results.  Refer to the "Links" section for chest pain algorithms and additional  guidance. Performed at [facility], [place] [address] [place], [place] [address]    DG Chest 2 View  Result Date: 05/22/2022 CLINICAL DATA:  Shortness of breath EXAM: CHEST - 2 VIEW COMPARISON:  12/22/2021 FINDINGS: Cardiomegaly with vascular congestion, small pleural effusions and pulmonary edema. No consolidation or pneumothorax. IMPRESSION: Cardiomegaly with vascular congestion, small pleural effusions and  pulmonary edema. Electronically Signed   By: Donavan Foil M.D.   On: 05/22/2022 21:37    Pending Labs Unresulted Labs (From admission, onward)    None       Vitals/Pain Today's Vitals   05/23/22 0215 05/23/22 0230 05/23/22 0300 05/23/22 0330  BP: (!) 142/101 (!) 162/108 (!) 175/112 (!) 175/106  Pulse: 91 93 76 93  Resp: 18 (!) 26 (!) 25 20  Temp:      TempSrc:      SpO2: 98% 94% 95% 98%  PainSc:        Isolation Precautions No active isolations  Medications Medications  nicardipine (CARDENE) 30m in 0.86% saline 2076mIV infusion (0.1 mg/ml) (3 mg/hr Intravenous New Bag/Given 05/23/22 0245)  labetalol (NORMODYNE) injection 10 mg (10 mg Intravenous Given 05/22/22 2234)  potassium chloride SA (KLOR-CON M) CR tablet 40 mEq (40 mEq Oral Given 05/22/22 2347)  furosemide (LASIX) injection 60 mg (60 mg Intravenous Given 05/22/22 2347)    Mobility walks     Focused Assessments Cardiac Assessment Handoff:  Cardiac Rhythm: Normal sinus rhythm Lab Results  Component Value Date   CKTOTAL 445 (H) 03/22/2017   TROPONINI 0.36 (HH) 03/22/2017   No results found for: "DDIMER" Does the Patient currently have chest pain? No    R Recommendations: See Admitting Provider Note  Report given to:   Additional Notes: A&O x4, uses urinal, pleasant, ambulatory.  IVs are ultrasound guided.

## 2022-05-23 NOTE — ED Notes (Signed)
Receiving RN Jeanice Lim has agreed to accept [facility] once pt has arrived to inpatient unit, all questions and concerns address.

## 2022-05-23 NOTE — ED Notes (Signed)
Attempted report to ICU 1222 as bed is showing "ready" status, was advised bed has not been approved at this time. Bed to be approved and assigned to receiving RN and handoff will be placed for report.

## 2022-05-23 NOTE — Progress Notes (Signed)
Echocardiogram 2D Echocardiogram has been performed.  Frances Furbish 05/23/2022, 2:34 PM

## 2022-05-23 NOTE — Progress Notes (Signed)
eLink Physician-Brief Progress Note Patient Name: Micheal Levy DOB: October 22, 1980 MRN: [id number]   Date of Service  05/23/2022  HPI/Events of Note  Patient admitted with hypertensive crisis, congestive heart failure and elevated Troponin (likely type 2 ischemia from severe ly increased afterload.)  eICU Interventions  New Patient Evaluation.        Kerry Kass Marilene Vath 05/23/2022, 4:22 AM

## 2022-05-23 NOTE — ED Notes (Signed)
IP Bed Assigned Assigned: [facility]-2WL - 1227/1227-01, bed is marked "DIRTY" Charge RN Maylon Cos made aware of bed assignment changed from "ready" bed to "dirty" bed

## 2022-05-24 DIAGNOSIS — N179 Acute kidney failure, unspecified: Secondary | ICD-10-CM

## 2022-05-24 DIAGNOSIS — I161 Hypertensive emergency: Secondary | ICD-10-CM | POA: Diagnosis not present

## 2022-05-24 LAB — BASIC METABOLIC PANEL
Anion gap: 12 (ref 5–15)
BUN: 33 mg/dL — ABNORMAL HIGH (ref 6–20)
CO2: 24 mmol/L (ref 22–32)
Calcium: 8.6 mg/dL — ABNORMAL LOW (ref 8.9–10.3)
Chloride: 102 mmol/L (ref 98–111)
Creatinine, Ser: 3.66 mg/dL — ABNORMAL HIGH (ref 0.61–1.24)
GFR, Estimated: 20 mL/min — ABNORMAL LOW (ref >60)
Glucose, Bld: 149 mg/dL — ABNORMAL HIGH (ref 70–99)
Potassium: 3.6 mmol/L (ref 3.5–5.1)
Sodium: 138 mmol/L (ref 135–145)

## 2022-05-24 LAB — GLUCOSE, CAPILLARY
Glucose-Capillary: 121 mg/dL — ABNORMAL HIGH (ref 70–99)
Glucose-Capillary: 121 mg/dL — ABNORMAL HIGH (ref 70–99)
Glucose-Capillary: 130 mg/dL — ABNORMAL HIGH (ref 70–99)
Glucose-Capillary: 148 mg/dL — ABNORMAL HIGH (ref 70–99)
Glucose-Capillary: 136 mg/dL — ABNORMAL HIGH (ref 70–99)

## 2022-05-24 MED ORDER — MELATONIN 3 MG PO TABS
3.0000 mg | ORAL_TABLET | Freq: Every evening | ORAL | Status: DC | PRN
  Administered 2022-05-24: 3 mg via ORAL
  Filled 2022-05-24 (×3): qty 1

## 2022-05-24 MED ORDER — INSULIN ASPART 100 UNIT/ML IJ SOLN
0.0000 [IU] | Freq: Three times a day (TID) | INTRAMUSCULAR | Status: DC
  Administered 2022-05-25: 2 [IU] via SUBCUTANEOUS
  Administered 2022-05-25 (×2): 3 [IU] via SUBCUTANEOUS
  Administered 2022-05-26: 2 [IU] via SUBCUTANEOUS
  Administered 2022-05-26 – 2022-05-27 (×2): 3 [IU] via SUBCUTANEOUS
  Administered 2022-05-27: 2 [IU] via SUBCUTANEOUS
  Administered 2022-05-28: 3 [IU] via SUBCUTANEOUS
  Administered 2022-05-28: 2 [IU] via SUBCUTANEOUS
  Administered 2022-05-28 – 2022-05-30 (×4): 3 [IU] via SUBCUTANEOUS
  Administered 2022-05-30: 2 [IU] via SUBCUTANEOUS

## 2022-05-24 NOTE — Progress Notes (Signed)
  Transition of Care [facility]) Screening Note   Patient Details  Name: Micheal Levy Date of Birth: 1980/05/18   Transition of Care [facility] CM/SW Contact:    Henrietta Dine, RN Phone Number: 05/24/2022, 10:17 AM    Transition of Care Department [facility]) has reviewed patient and no TOC needs have been identified at this time. We will continue to monitor patient advancement through interdisciplinary progression rounds. If new patient transition needs arise, please place a TOC consult.

## 2022-05-24 NOTE — Hospital Course (Signed)
Micheal Levy is a 42 y.o. male with a history of CKD stage IIIb, chronic diastolic heart failure, hypertension. Patient presented secondary to increased leg edema and dyspnea and found to have hypertensive emergency. Patient required ICU admission and Cardene drip. Home antihypertensives restarted.

## 2022-05-24 NOTE — Progress Notes (Signed)
PROGRESS NOTE    Micheal Levy  [id number] DOB: 11-07-1980 DOA: 05/22/2022 PCP: [name], NP   Brief Narrative: Micheal Levy is a 42 y.o. male with a history of CKD stage IIIb, chronic diastolic heart failure, hypertension. Patient presented secondary to increased leg edema and dyspnea and found to have hypertensive emergency. Patient required ICU admission and Cardene drip. Home antihypertensives restarted.   Assessment and Plan:  Hypertensive emergency Secondary to medication nonadherence. Blood pressure with SBP up to 255 in the ED. Associated troponin leak. Patient started on Cardene drip and home antihypertensives restarted. Cardene drip weaned off and patient transferred out of the ICU on 2/17. Blood pressure better controlled -Continue Coreg, amlodipine, clonidine, hydralazine, Imdur -Trend BP  Diabetes mellitus type 2 Uncontrolled with hyperglycemia. Most current hemoglobin A1C of 8.3%. Patient is managed on insuline glargine and glipizide as an outpatient. -Continue SSI  Chronic heart failure failure with preserved EF Transthoracic Echocardiogram obtained this admission significant for an LVEF of 60-65%. Losartan held secondary to presumed AKI. -Continue torsemide  AKI on CKD stage 3b Prior known creatinine of 2.7. Patient follows with nephrology as an outpatient. Creatinine of 3.55 on admission. Unclear if this is AKI or progression of kidney disease. Creatinine is stable. Losartan held.  -Repeat BMP in AM   DVT prophylaxis: Heparin subq Code Status:   Code Status: Prior Family Communication: None at bedside Disposition Plan: Discharge hopefully in 1 day pending stable creatinine   Consultants:  PCCM  Procedures:  None  Antimicrobials: None    Subjective: Patient reports no issues this morning. No dyspnea. No chest pain.  Objective: BP (!) 131/96 (BP Location: Right Arm)   Pulse 61   Temp 98 F (36.7 C) (Tympanic)   Resp (!) 21   Ht  5' 8.5" (1.74 m)   Wt 116.9 kg   SpO2 93%   BMI 38.62 kg/m   Examination:  General exam: Appears calm and comfortable Respiratory system: Clear to auscultation. Respiratory effort normal. Cardiovascular system: S1 & S2 heard, RRR. Gastrointestinal system: Abdomen is nondistended, soft and nontender. Normal bowel sounds heard. Central nervous system: Alert and oriented. No focal neurological deficits. Musculoskeletal: No calf tenderness Skin: No cyanosis. No rashes Psychiatry: Judgement and insight appear normal. Mood & affect appropriate.    Data Reviewed: I have personally reviewed following labs and imaging studies  CBC Lab Results  Component Value Date   WBC 10.3 05/22/2022   RBC 4.84 05/22/2022   HGB 12.8 (L) 05/22/2022   HCT 41.3 05/22/2022   MCV 85.3 05/22/2022   MCH 26.4 05/22/2022   PLT 353 05/22/2022   MCHC 31.0 05/22/2022   RDW 16.0 (H) 05/22/2022   LYMPHSABS 1.9 05/22/2022   MONOABS 0.5 05/22/2022   EOSABS 0.1 05/22/2022   BASOSABS 0.1 [id number]     Last metabolic panel Lab Results  Component Value Date   NA 138 05/24/2022   K 3.6 05/24/2022   CL 102 05/24/2022   CO2 24 05/24/2022   BUN 33 (H) 05/24/2022   CREATININE 3.66 (H) 05/24/2022   GLUCOSE 149 (H) 05/24/2022   GFRNONAA 20 (L) 05/24/2022   GFRAA 60 11/11/2019   CALCIUM 8.6 (L) 05/24/2022   PHOS 4.3 12/25/2021   PROT 6.5 05/22/2022   ALBUMIN 2.8 (L) 05/22/2022   LABGLOB 2.9 05/28/2021   AGRATIO 1.5 11/11/2019   BILITOT 1.0 05/22/2022   ALKPHOS 65 05/22/2022   AST 23 05/22/2022   ALT 16 05/22/2022   ANIONGAP  12 05/24/2022    GFR: Estimated Creatinine Clearance: 32.6 mL/min (A) (by C-G formula based on SCr of 3.66 mg/dL (H)).  Recent Results (from the past 240 hour(s))  MRSA Next Gen by PCR, Nasal     Status: None   Collection Time: 05/23/22  3:53 AM   Specimen: Nasal Mucosa; Nasal Swab  Result Value Ref Range Status   MRSA by PCR Next Gen NOT DETECTED NOT DETECTED Final     Comment: (NOTE) The GeneXpert MRSA Assay (FDA approved for NASAL specimens only), is one component of a comprehensive MRSA colonization surveillance program. It is not intended to diagnose MRSA infection nor to guide or monitor treatment for MRSA infections. Test performance is not FDA approved in patients less than 26 years old. Performed at [facility], [place] [address] [place], [place] [address]       Radiology Studies: ECHOCARDIOGRAM LIMITED  Result Date: 05/23/2022    ECHOCARDIOGRAM LIMITED REPORT   Patient Name:   Micheal Levy Date of Exam: 05/23/2022 Medical Rec #:  [id number]       Height:       68.5 in Accession #:    [id number]      Weight:       257.7 lb Date of Birth:  December 09, 1980       BSA:          2.289 m Patient Age:    59 years        BP:           135/67 mmHg Patient Gender: M               HR:           80 bpm. Exam Location:  Inpatient Procedure: Limited Echo, Cardiac Doppler and Limited Color Doppler Indications:    Acute ischemic heart disease  History:        Patient has prior history of Echocardiogram examinations.                 Stroke; Risk Factors:Diabetes and Hypertension.  Sonographer:    Phineas Douglas Referring Phys: [id number] [place]  1. Left ventricular ejection fraction, by estimation, is 65 to 70%. The left ventricle has normal function. The left ventricle has no regional wall motion abnormalities. There is severe left ventricular hypertrophy.  2. Right ventricular systolic function is normal. The right ventricular size is normal. There is mildly elevated pulmonary artery systolic pressure. The estimated right ventricular systolic pressure is 123XX123 mmHg.  3. The mitral valve is normal in structure. Trivial mitral valve regurgitation. No evidence of mitral stenosis.  4. The aortic valve is tricuspid. Aortic valve regurgitation is not visualized. No aortic stenosis is present.  5. The inferior vena cava is dilated in size with <50%  respiratory variability, suggesting right atrial pressure of 15 mmHg. Comparison(s): No significant change from prior study. Prior images reviewed side by side. Conclusion(s)/Recommendation(s): Findings consistent with hypertrophic cardiomyopathy. FINDINGS  Left Ventricle: Left ventricular ejection fraction, by estimation, is 65 to 70%. The left ventricle has normal function. The left ventricle has no regional wall motion abnormalities. The left ventricular internal cavity size was normal in size. There is  severe left ventricular hypertrophy. Right Ventricle: The right ventricular size is normal. No increase in right ventricular wall thickness. Right ventricular systolic function is normal. There is mildly elevated pulmonary artery systolic pressure. The tricuspid regurgitant velocity is 2.46  m/s, and with an assumed right atrial pressure  of 15 mmHg, the estimated right ventricular systolic pressure is 123XX123 mmHg. Left Atrium: Left atrial size was normal in size. Right Atrium: Right atrial size was normal in size. Pericardium: There is no evidence of pericardial effusion. Mitral Valve: The mitral valve is normal in structure. Trivial mitral valve regurgitation. No evidence of mitral valve stenosis. Tricuspid Valve: The tricuspid valve is normal in structure. Tricuspid valve regurgitation is trivial. No evidence of tricuspid stenosis. Aortic Valve: The aortic valve is tricuspid. Aortic valve regurgitation is not visualized. No aortic stenosis is present. Pulmonic Valve: The pulmonic valve was normal in structure. Pulmonic valve regurgitation is not visualized. No evidence of pulmonic stenosis. Aorta: The aortic root is normal in size and structure. Venous: The inferior vena cava is dilated in size with less than 50% respiratory variability, suggesting right atrial pressure of 15 mmHg. IAS/Shunts: No atrial level shunt detected by color flow Doppler. LEFT VENTRICLE PLAX 2D LVIDd:         4.50 cm LVIDs:         3.20  cm LV PW:         2.20 cm LV IVS:        1.90 cm  LV Volumes (MOD) LV vol d, MOD A2C: 106.0 ml LV vol d, MOD A4C: 108.0 ml LV vol s, MOD A2C: 48.7 ml LV vol s, MOD A4C: 49.6 ml LV SV MOD A2C:     57.3 ml LV SV MOD A4C:     108.0 ml LV SV MOD BP:      61.1 ml IVC IVC diam: 3.00 cm TRICUSPID VALVE TR Peak grad:   24.2 mmHg TR Vmax:        246.00 cm/s Candee Furbish MD Electronically signed by Candee Furbish MD Signature Date/Time: 05/23/2022/3:26:35 PM    Final    DG Chest 2 View  Result Date: 05/22/2022 CLINICAL DATA:  Shortness of breath EXAM: CHEST - 2 VIEW COMPARISON:  12/22/2021 FINDINGS: Cardiomegaly with vascular congestion, small pleural effusions and pulmonary edema. No consolidation or pneumothorax. IMPRESSION: Cardiomegaly with vascular congestion, small pleural effusions and pulmonary edema. Electronically Signed   By: Donavan Foil M.D.   On: 05/22/2022 21:37      LOS: 1 day    Cordelia Poche, MD [facility] 05/24/2022, 4:08 PM   If 7PM-7AM, please contact night-coverage [email or web address]

## 2022-05-25 ENCOUNTER — Inpatient Hospital Stay (HOSPITAL_COMMUNITY): Payer: Medicaid Other

## 2022-05-25 DIAGNOSIS — N179 Acute kidney failure, unspecified: Secondary | ICD-10-CM | POA: Diagnosis not present

## 2022-05-25 DIAGNOSIS — I161 Hypertensive emergency: Secondary | ICD-10-CM | POA: Diagnosis not present

## 2022-05-25 LAB — BASIC METABOLIC PANEL
Anion gap: 11 (ref 5–15)
BUN: 38 mg/dL — ABNORMAL HIGH (ref 6–20)
CO2: 24 mmol/L (ref 22–32)
Calcium: 8.3 mg/dL — ABNORMAL LOW (ref 8.9–10.3)
Chloride: 100 mmol/L (ref 98–111)
Creatinine, Ser: 3.93 mg/dL — ABNORMAL HIGH (ref 0.61–1.24)
GFR, Estimated: 19 mL/min — ABNORMAL LOW (ref >60)
Glucose, Bld: 181 mg/dL — ABNORMAL HIGH (ref 70–99)
Potassium: 3.5 mmol/L (ref 3.5–5.1)
Sodium: 135 mmol/L (ref 135–145)

## 2022-05-25 LAB — GLUCOSE, CAPILLARY
Glucose-Capillary: 153 mg/dL — ABNORMAL HIGH (ref 70–99)
Glucose-Capillary: 145 mg/dL — ABNORMAL HIGH (ref 70–99)
Glucose-Capillary: 164 mg/dL — ABNORMAL HIGH (ref 70–99)
Glucose-Capillary: 166 mg/dL — ABNORMAL HIGH (ref 70–99)

## 2022-05-25 LAB — CREATININE, URINE, RANDOM: Creatinine, Urine: 129 mg/dL

## 2022-05-25 LAB — SODIUM, URINE, RANDOM: Sodium, Ur: <10 mmol/L

## 2022-05-25 MED ORDER — TORSEMIDE 20 MG PO TABS
80.0000 mg | ORAL_TABLET | Freq: Two times a day (BID) | ORAL | Status: DC
  Administered 2022-05-25 – 2022-05-26 (×2): 80 mg via ORAL
  Filled 2022-05-25 (×2): qty 4

## 2022-05-25 NOTE — Plan of Care (Signed)
  Problem: Clinical Measurements: Goal: Diagnostic test results will improve Outcome: Progressing   Problem: Nutrition: Goal: Adequate nutrition will be maintained Outcome: Progressing   Problem: Elimination: Goal: Will not experience complications related to bowel motility Outcome: Progressing

## 2022-05-25 NOTE — Consult Note (Addendum)
Renal Service Consult Note [facility]  Micheal Levy 05/25/2022 Micheal Blazing, MD Requesting Physician: Dr. Lonny Prude  Reason for Consult: Renal failure HPI: The patient is a 42 y.o. year-old w/ PMH as below who presented 2/15 w/ c/o worsening lower extremity edema and some SOB. Is on multiple BP lowering meds at home, has been taking. Retaining more fluid in his legs. Had a similar event this time last year which correlates w/ the passing of his mother in 2023. In ED BP 255/ 159, rec'd IV labetalol then IV Cardene gtt and ICU admission. Po BP meds were started as well and BP's are under better control.  Creat was 3.5 on admis, 3.6 yest and 3.93 today. UOP has been okay, 1.4L on 2/16, 650 cc yesterday and 675 cc so far today. We are asked to see for renal failure.   Pt was admitted last Feb 2023 and rec'd CRRT for 2-3 days due to marked vol overload. 15-20 kg were removed and didn't require any regular dialysis. Is f/b Dr Carolin Sicks at [facility], last seen 2-3 mos ago. No access plans as of yet.    Today pt states SOB and leg edema are better.    ROS - denies CP, no joint pain, no HA, no blurry vision, no rash, no diarrhea, no nausea/ vomiting, no dysuria, no difficulty voiding   Past Medical History  Past Medical History:  Diagnosis Date   Chronic kidney disease    Diabetes mellitus without complication ([place])    Elevated serum cholesterol 04/2019   Elevated serum creatinine 04/2019   Hypertension    Hypertensive urgency 04/2019   Seizures ([place]) 03/2017   x 2    Stroke ([place]) 03/2017   Substance abuse ([place])    Vitamin D deficiency 04/2019   Past Surgical History  Past Surgical History:  Procedure Laterality Date   RIGHT HEART CATH N/A 05/31/2021   Procedure: RIGHT HEART CATH;  Surgeon: Jettie Booze, MD;  Location: [facility];  Service: Cardiovascular;  Laterality: N/A;   Family History  Family History  Problem Relation Age of Onset   Diabetes Father     Heart attack Father    Diabetes Sister    Hypertension Brother    Social History  reports that he quit smoking about 23 years ago. His smoking use included cigarettes. He has never used smokeless tobacco. He reports current alcohol use. He reports current drug use. Drugs: Cocaine and Marijuana. Allergies No Known Allergies Home medications Prior to Admission medications   Medication Sig Start Date End Date Taking? Authorizing Provider  amLODipine (NORVASC) 10 MG tablet Take 10 mg by mouth daily.   Yes [provider]  carvedilol (COREG) 25 MG tablet Take 25 mg by mouth 2 (two) times daily with a meal.   Yes [provider]  cloNIDine (CATAPRES) 0.3 MG tablet Take 1 tablet (0.3 mg total) by mouth 3 (three) times daily. 06/08/21  Yes Eugenie Filler, MD  hydrALAZINE (APRESOLINE) 100 MG tablet Take 1 tablet (100 mg total) by mouth every 8 (eight) hours. 06/24/21  Yes [name], Kardie, DO  insulin glargine (LANTUS SOLOSTAR) 100 UNIT/ML Solostar Pen Inject 5 Units into the skin daily at 10 pm. 06/08/21 06/08/22 Yes Eugenie Filler, MD  isosorbide mononitrate (IMDUR) 120 MG 24 hr tablet Take 1 tablet (120 mg total) by mouth daily. 06/09/21  Yes Eugenie Filler, MD  losartan (COZAAR) 50 MG tablet Take 50 mg by mouth daily.  12/04/21  Yes [provider]  Multiple Vitamins-Minerals (MULTIVITAMIN ADULTS) TABS Take 1 tablet by mouth daily.   Yes [provider]  torsemide (DEMADEX) 20 MG tablet Take 80 mg by mouth daily.   Yes [provider]  albuterol (VENTOLIN HFA) 108 (90 Base) MCG/ACT inhaler Inhale 2 puffs into the lungs every 6 (six) hours as needed for wheezing or shortness of breath. Patient not taking: Reported on 05/22/2022 04/19/19   Azzie Glatter, FNP  aspirin EC 81 MG tablet Take 1 tablet (81 mg total) by mouth daily. Patient not taking: Reported on 12/22/2021 06/08/21   Eugenie Filler, MD  Blood Glucose Monitoring Suppl (TRUE METRIX AIR GLUCOSE  METER) DEVI 1 each by Does not apply route 4 (four) times daily -  before meals and at bedtime. 04/19/19   Azzie Glatter, FNP  glipiZIDE (GLUCOTROL XL) 10 MG 24 hr tablet Take 1 tablet (10 mg total) by mouth daily with breakfast. Patient not taking: Reported on 05/22/2022 06/08/21   Eugenie Filler, MD  glucose blood (TRUE METRIX BLOOD GLUCOSE TEST) test strip Use as instructed Patient taking differently: 1 each by Other route as directed. 04/19/19   Azzie Glatter, FNP  Insulin Pen Needle (PEN NEEDLES) 31G X 6 MM MISC 1 each by Does not apply route at bedtime. 10/05/17   Dorena Dew, FNP  INSULIN SYRINGE .5CC/29G (B-D INS SYR ULTRAFINE .5CC/29G) 29G X 1/2" 0.5 ML MISC 1 each by Does not apply route at bedtime. 04/02/17   Dorena Dew, FNP  Lancets MISC 1 each by Does not apply route 4 (four) times daily -  before meals and at bedtime. 04/19/19   Azzie Glatter, FNP  senna-docusate (SENOKOT-S) 8.6-50 MG tablet Take 1 tablet by mouth at bedtime as needed for mild constipation. Patient not taking: Reported on 05/22/2022 12/25/21   Hosie Poisson, MD  torsemide (DEMADEX) 20 MG tablet Take 4 tablets (80 mg total) by mouth daily. 12/25/21 03/25/22  Hosie Poisson, MD     Vitals:   05/24/22 1305 05/24/22 1725 05/24/22 2110 05/25/22 0449  BP: (!) 131/96 (!) 144/84 (!) 126/91 (!) 137/101  Pulse: 61 70 63 (!) 59  Resp: (!) 21 18 20 18  $ Temp: 98 F (36.7 C) 98.2 F (36.8 C) 97.6 F (36.4 C) 98.1 F (36.7 C)  TempSrc: Tympanic Oral Oral Oral  SpO2: 93% 99% 100% 99%  Weight:      Height:       Exam Gen alert, no distress No rash, cyanosis or gangrene Sclera anicteric, throat clear  No jvd or bruits Chest dec'd BS bilat bases, no rales/ wheezing RRR no MRG Abd soft ntnd no mass or ascites +bs GU normal male MS no joint effusions or deformity Ext no LE or UE edema, no wounds or ulcers Neuro is alert, Ox 3 , nf      Home meds include - norvasc 10, coreg 25 bid, clonidine 0.3  tid, hydralazine 100 tid, insulin glargine, imdur, losartan 50 qd, torsemide 80 qd, aspirin, glipizide, prns/ vits/ supps      Date   Creat  eGFR    2017   1.23    2018   1.30- 2.86    2019   1.22- 1.30    2021   1.59- 2.75    2022   1.65- 2.92    Jan 2023  1.80- 2.73    2/01- 05/11/21 3.03- 3.35    2/11- 06/08/21 1.54-  4.15    06/24/21  2.45    Sept 2023  2.41- 2.76 28- 33 ml/min    2/15   3.55    2/16   3.40     2/17   3.66    2/18   3.93  19 ml/min     B/l creat 2.4- 2.8 from sept 2023, eGFR 28- 33 ml/min     UA 2/15 - prot >300, 0-5 rbc/ wbc      CXR 2/15 - showed bilat pulm edema / vasc congestion R > L       I/O = 3.2 L in and 3.1 L out = net zero      Na 135 K 3.5 CO2 24  BUN 38  creat 3.9    Assessment/ Plan: AKI on CKD 3b - b/l creat 2.4- 2.8 from sept 2023, eGFR 28- 33 ml/min. Creat here was 3.5 on admission in setting of severe HTN, LE edema and pulm edema/ SOB. Got 1 dose IV lasix and IV cardene then home po BP meds and BP has come under control but creat bump to 3.9 today. Edema and SOB better. AKI could be from uncont HTN and/or decomp CHF, vs progression of CKD. Will get ambulatory O2 sats and repeat CXR, hold IV lasix. Will resume home torsemide at twice the dose (80 po bid vs home dose 80 mg qam) and await results of CXR/ amb sats. F/u creat in am.  HTNsive emergency - resolved, on home meds except losartan which is on hold DM2 - per pmd A/C diast CHF - as above, got IV lasix x 1 here only. Repeat CXR / amb sat as above     Kelly Splinter  MD [facility] 05/25/2022, 11:23 AM  Recent Labs  Lab 05/22/22 2217 05/23/22 0655 05/24/22 0856 05/25/22 0511  HGB 12.8*  --   --   --   ALBUMIN 2.8*  --   --   --   CALCIUM 8.5*   < > 8.6* 8.3*  CREATININE 3.55*   < > 3.66* 3.93*  K 2.7*   < > 3.6 3.5   < > = values in this interval not displayed.   Inpatient medications:  amLODipine  10 mg Oral Daily   aspirin EC  81 mg Oral Daily   carvedilol  25 mg Oral BID WC   cloNIDine   0.3 mg Oral TID   fluticasone  2 spray Each Nare BID   heparin injection (subcutaneous)  5,000 Units Subcutaneous Q8H   hydrALAZINE  100 mg Oral Q8H   insulin aspart  0-15 Units Subcutaneous TID WC   isosorbide mononitrate  120 mg Oral Daily   oxymetazoline  1 spray Each Nare BID    labetalol, melatonin, mouth rinse

## 2022-05-25 NOTE — Progress Notes (Signed)
PROGRESS NOTE    Micheal Levy  [id number] DOB: [birth year] DOA: 05/22/2022 PCP: [name], NP   Brief Narrative: Micheal Levy is a 42 y.o. male with a history of CKD stage IIIb, chronic diastolic heart failure, hypertension. Patient presented secondary to increased leg edema and dyspnea and found to have hypertensive emergency. Patient required ICU admission and Cardene drip. Home antihypertensives restarted.   Assessment and Plan:  Hypertensive emergency Secondary to medication nonadherence. Blood pressure with SBP up to 255 in the ED. Associated troponin leak. Patient started on Cardene drip and home antihypertensives restarted. Cardene drip weaned off and patient transferred out of the ICU on 2/17. Blood pressure better controlled but still elevated -Continue Coreg, amlodipine, clonidine, hydralazine, Imdur -Hold losartan secondary to AKI -Trend BP  Diabetes mellitus type 2 Uncontrolled with hyperglycemia. Most current hemoglobin A1C of 8.3%. Patient is managed on insuline glargine and glipizide as an outpatient. -Continue SSI  Chronic heart failure failure with preserved EF Transthoracic Echocardiogram obtained this admission significant for an LVEF of 60-65%. Losartan held secondary to presumed AKI. -Hold torsemide secondary to worsening creatinine  AKI on CKD stage 3b Prior known creatinine of 2.7. Patient follows with nephrology as an outpatient. Creatinine of 3.55 on admission. Unclear if this is AKI or progression of kidney disease. Creatinine is worsening on continued diuresis. Losartan held.  -Nephrology consult   DVT prophylaxis: Heparin subq Code Status:   Code Status: Prior Family Communication: Wife at bedside Disposition Plan: Discharge pending stability/improvement of creatinine/AKI and nephrology recommendations   Consultants:  PCCM Nephrology  Procedures:  None  Antimicrobials: None    Subjective: No concerns this morning. Patient  states he has been urinating frequently. His wife has dumped urine from overnight, not knowing we needed accurate output documented.  Objective: BP (!) 157/111 (BP Location: Right Arm)   Pulse 71   Temp 98 F (36.7 C) (Oral)   Resp (!) 24   Ht 5' 8.5" (1.74 m)   Wt 120.5 kg   SpO2 97%   BMI 39.80 kg/m   Examination:  General exam: Appears calm and comfortable Respiratory system: Clear to auscultation. Respiratory effort normal. Cardiovascular system: S1 & S2 heard, RRR. Gastrointestinal system: Abdomen is nondistended, soft and nontender.  Normal bowel sounds heard. Central nervous system: Alert and oriented. No focal neurological deficits. Musculoskeletal: 1+ BLE pitting edema. No calf tenderness Skin: No cyanosis. No rashes Psychiatry: Judgement and insight appear normal. Mood & affect appropriate.   Data Reviewed: I have personally reviewed following labs and imaging studies  CBC Lab Results  Component Value Date   WBC 10.3 05/22/2022   RBC 4.84 05/22/2022   HGB 12.8 (L) 05/22/2022   HCT 41.3 05/22/2022   MCV 85.3 05/22/2022   MCH 26.4 05/22/2022   PLT 353 05/22/2022   MCHC 31.0 05/22/2022   RDW 16.0 (H) 05/22/2022   LYMPHSABS 1.9 05/22/2022   MONOABS 0.5 05/22/2022   EOSABS 0.1 05/22/2022   BASOSABS 0.1 [id number]     Last metabolic panel Lab Results  Component Value Date   NA 135 05/25/2022   K 3.5 05/25/2022   CL 100 05/25/2022   CO2 24 05/25/2022   BUN 38 (H) 05/25/2022   CREATININE 3.93 (H) 05/25/2022   GLUCOSE 181 (H) 05/25/2022   GFRNONAA 19 (L) 05/25/2022   GFRAA 60 11/11/2019   CALCIUM 8.3 (L) 05/25/2022   PHOS 4.3 12/25/2021   PROT 6.5 05/22/2022   ALBUMIN 2.8 (L) 05/22/2022  LABGLOB 2.9 05/28/2021   AGRATIO 1.5 11/11/2019   BILITOT 1.0 05/22/2022   ALKPHOS 65 05/22/2022   AST 23 05/22/2022   ALT 16 05/22/2022   ANIONGAP 11 05/25/2022    GFR: Estimated Creatinine Clearance: 30.9 mL/min (A) (by C-G formula based on SCr of 3.93 mg/dL  (H)).  Recent Results (from the past 240 hour(s))  MRSA Next Gen by PCR, Nasal     Status: None   Collection Time: 05/23/22  3:53 AM   Specimen: Nasal Mucosa; Nasal Swab  Result Value Ref Range Status   MRSA by PCR Next Gen NOT DETECTED NOT DETECTED Final    Comment: (NOTE) The GeneXpert MRSA Assay (FDA approved for NASAL specimens only), is one component of a comprehensive MRSA colonization surveillance program. It is not intended to diagnose MRSA infection nor to guide or monitor treatment for MRSA infections. Test performance is not FDA approved in patients less than 40 years old. Performed at [facility], [place] [address] [place], [place] [address]       Radiology Studies: ECHOCARDIOGRAM LIMITED  Result Date: 05/23/2022    ECHOCARDIOGRAM LIMITED REPORT   Patient Name:   Micheal Levy Date of Exam: 05/23/2022 Medical Rec #:  [id number]       Height:       68.5 in Accession #:    [id number]      Weight:       257.7 lb Date of Birth:  March 27, 1981       BSA:          2.289 m Patient Age:    57 years        BP:           135/67 mmHg Patient Gender: M               HR:           80 bpm. Exam Location:  Inpatient Procedure: Limited Echo, Cardiac Doppler and Limited Color Doppler Indications:    Acute ischemic heart disease  History:        Patient has prior history of Echocardiogram examinations.                 Stroke; Risk Factors:Diabetes and Hypertension.  Sonographer:    Phineas Douglas Referring Phys: [id number] [place]  1. Left ventricular ejection fraction, by estimation, is 65 to 70%. The left ventricle has normal function. The left ventricle has no regional wall motion abnormalities. There is severe left ventricular hypertrophy.  2. Right ventricular systolic function is normal. The right ventricular size is normal. There is mildly elevated pulmonary artery systolic pressure. The estimated right ventricular systolic pressure is 123XX123 mmHg.  3. The mitral  valve is normal in structure. Trivial mitral valve regurgitation. No evidence of mitral stenosis.  4. The aortic valve is tricuspid. Aortic valve regurgitation is not visualized. No aortic stenosis is present.  5. The inferior vena cava is dilated in size with <50% respiratory variability, suggesting right atrial pressure of 15 mmHg. Comparison(s): No significant change from prior study. Prior images reviewed side by side. Conclusion(s)/Recommendation(s): Findings consistent with hypertrophic cardiomyopathy. FINDINGS  Left Ventricle: Left ventricular ejection fraction, by estimation, is 65 to 70%. The left ventricle has normal function. The left ventricle has no regional wall motion abnormalities. The left ventricular internal cavity size was normal in size. There is  severe left ventricular hypertrophy. Right Ventricle: The right ventricular size is normal. No increase in right ventricular  wall thickness. Right ventricular systolic function is normal. There is mildly elevated pulmonary artery systolic pressure. The tricuspid regurgitant velocity is 2.46  m/s, and with an assumed right atrial pressure of 15 mmHg, the estimated right ventricular systolic pressure is 123XX123 mmHg. Left Atrium: Left atrial size was normal in size. Right Atrium: Right atrial size was normal in size. Pericardium: There is no evidence of pericardial effusion. Mitral Valve: The mitral valve is normal in structure. Trivial mitral valve regurgitation. No evidence of mitral valve stenosis. Tricuspid Valve: The tricuspid valve is normal in structure. Tricuspid valve regurgitation is trivial. No evidence of tricuspid stenosis. Aortic Valve: The aortic valve is tricuspid. Aortic valve regurgitation is not visualized. No aortic stenosis is present. Pulmonic Valve: The pulmonic valve was normal in structure. Pulmonic valve regurgitation is not visualized. No evidence of pulmonic stenosis. Aorta: The aortic root is normal in size and structure.  Venous: The inferior vena cava is dilated in size with less than 50% respiratory variability, suggesting right atrial pressure of 15 mmHg. IAS/Shunts: No atrial level shunt detected by color flow Doppler. LEFT VENTRICLE PLAX 2D LVIDd:         4.50 cm LVIDs:         3.20 cm LV PW:         2.20 cm LV IVS:        1.90 cm  LV Volumes (MOD) LV vol d, MOD A2C: 106.0 ml LV vol d, MOD A4C: 108.0 ml LV vol s, MOD A2C: 48.7 ml LV vol s, MOD A4C: 49.6 ml LV SV MOD A2C:     57.3 ml LV SV MOD A4C:     108.0 ml LV SV MOD BP:      61.1 ml IVC IVC diam: 3.00 cm TRICUSPID VALVE TR Peak grad:   24.2 mmHg TR Vmax:        246.00 cm/s Candee Furbish MD Electronically signed by Candee Furbish MD Signature Date/Time: 05/23/2022/3:26:35 PM    Final       LOS: 2 days    Cordelia Poche, MD [facility] 05/25/2022, 12:49 PM   If 7PM-7AM, please contact night-coverage [email or web address]

## 2022-05-26 DIAGNOSIS — I161 Hypertensive emergency: Secondary | ICD-10-CM | POA: Diagnosis not present

## 2022-05-26 DIAGNOSIS — N179 Acute kidney failure, unspecified: Secondary | ICD-10-CM | POA: Diagnosis not present

## 2022-05-26 LAB — BASIC METABOLIC PANEL
Anion gap: 12 (ref 5–15)
BUN: 39 mg/dL — ABNORMAL HIGH (ref 6–20)
CO2: 24 mmol/L (ref 22–32)
Calcium: 8.2 mg/dL — ABNORMAL LOW (ref 8.9–10.3)
Chloride: 99 mmol/L (ref 98–111)
Creatinine, Ser: 3.92 mg/dL — ABNORMAL HIGH (ref 0.61–1.24)
GFR, Estimated: 19 mL/min — ABNORMAL LOW (ref >60)
Glucose, Bld: 171 mg/dL — ABNORMAL HIGH (ref 70–99)
Potassium: 3.3 mmol/L — ABNORMAL LOW (ref 3.5–5.1)
Sodium: 135 mmol/L (ref 135–145)

## 2022-05-26 LAB — GLUCOSE, CAPILLARY
Glucose-Capillary: 142 mg/dL — ABNORMAL HIGH (ref 70–99)
Glucose-Capillary: 115 mg/dL — ABNORMAL HIGH (ref 70–99)
Glucose-Capillary: 159 mg/dL — ABNORMAL HIGH (ref 70–99)
Glucose-Capillary: 169 mg/dL — ABNORMAL HIGH (ref 70–99)

## 2022-05-26 MED ORDER — FUROSEMIDE 10 MG/ML IJ SOLN
120.0000 mg | Freq: Two times a day (BID) | INTRAVENOUS | Status: DC
  Administered 2022-05-26 – 2022-05-27 (×3): 120 mg via INTRAVENOUS
  Filled 2022-05-26 (×3): qty 10

## 2022-05-26 MED ORDER — METOLAZONE 5 MG PO TABS
10.0000 mg | ORAL_TABLET | Freq: Every day | ORAL | Status: DC
  Administered 2022-05-26 – 2022-05-30 (×5): 10 mg via ORAL
  Filled 2022-05-26 (×5): qty 2

## 2022-05-26 MED ORDER — POTASSIUM CHLORIDE CRYS ER 20 MEQ PO TBCR
40.0000 meq | EXTENDED_RELEASE_TABLET | Freq: Once | ORAL | Status: AC
  Administered 2022-05-26: 40 meq via ORAL
  Filled 2022-05-26: qty 2

## 2022-05-26 NOTE — Plan of Care (Signed)

## 2022-05-26 NOTE — Progress Notes (Signed)
[facility] Progress Note  Subjective: UOP 1350 cc. BP's stable 135/80 range.   Vitals:   05/25/22 0449 05/25/22 1204 05/25/22 1313 05/25/22 2140  BP: (!) 137/101 (!) 157/111 135/83 138/80  Pulse: (!) 59 71 65 (!) 59  Resp: 18 (!) 24 18 18  $ Temp: 98.1 F (36.7 C) 98 F (36.7 C) 98.1 F (36.7 C) 97.9 F (36.6 C)  TempSrc: Oral Oral Oral Oral  SpO2: 99% 97% 98% 98%  Weight:  120.5 kg    Height:  5' 8.5" (1.74 m)      Exam: Gen alert, no distress No jvd or bruits Chest rales bilat bases RRR no MRG Abd soft ntnd no mass or ascites +bs Ext mild edema LE's Neuro is alert, Ox 3 , nf       Home meds include - norvasc 10, coreg 25 bid, clonidine 0.3 tid, hydralazine 100 tid, insulin glargine, imdur, losartan 50 qd, torsemide 80 qd, aspirin, glipizide, prns/ vits/ supps        Date                          Creat               eGFR    2017                         1.23    2018                         1.30- 2.86    2019                         1.22- 1.30    2021                         1.59- 2.75    2022                         1.65- 2.92    Jan 2023                  1.80- 2.73    2/01- 05/11/21            3.03- 3.35    2/11- 06/08/21            1.54- 4.15    06/24/21                     2.45    Sept 2023                 2.41- 2.76        28- 33 ml/min    2/15                          3.55    2/16                          3.40         2/17                          3.66    2/18  3.93                 19 ml/min      B/l creat 2.4- 2.8 from sept 2023, eGFR 28- 33 ml/min     UA 2/15 - prot >300, 0-5 rbc/ wbc      CXR 2/15 - showed bilat pulm edema / vasc congestion R > L       I/O = 3.2 L in and 3.1 L out = net zero      Na 135 K 3.5 CO2 24  BUN 38  creat 3.16 May 2021 admit --> 3 wks diuresed 120kg down to 102kg, max IV lasix 120-160 bid, had 4-5 days or CRRT as well            Assessment/ Plan: AKI on CKD 3b - b/l creat 2.4- 2.8  from sept 2023, eGFR 28- 33 ml/min. F/b Dr. Carolin Sicks at [facility]. Creat here was 3.5 on admission in setting of severe HTN, LE edema and pulm edema/ SOB. Got 1 dose IV lasix and IV cardene then home po BP meds and BP has come under control but creat bumped to 3.9 on 2/18. SOB a bit better, CXR 2/18 still+ edema and pt still feels swollen and not voiding a lot. AKI suspected due to decomp CHF vs progression of CKD (less likely). 1 year ago he was in for vol ^^ and w/ diuresis + CRRT he went from 120kg to 102kg at dc after 3 wks. Has not lost any wt here so far --> will dc torsemide po and start IV lasix at 120 mg bid w/ po metolazone 26m qd. Will follow.  HTNsive emergency - good BPs 138/80 range.  Getting home meds coreg, norvasc and clonidine. Losartan is on hold.  DM2 - per pmd A/C diast CHF - as above         RKelly SplinterMD [facility] 05/26/2022, 5:09 AM  Recent Labs  Lab 05/22/22 2217 05/23/22 0655 05/24/22 0856 05/25/22 0511  HGB 12.8*  --   --   --   ALBUMIN 2.8*  --   --   --   CALCIUM 8.5*   < > 8.6* 8.3*  CREATININE 3.55*   < > 3.66* 3.93*  K 2.7*   < > 3.6 3.5   < > = values in this interval not displayed.   No results for input(s): "IRON", "TIBC", "FERRITIN" in the last 168 hours. Inpatient medications:  amLODipine  10 mg Oral Daily   aspirin EC  81 mg Oral Daily   carvedilol  25 mg Oral BID WC   cloNIDine  0.3 mg Oral TID   fluticasone  2 spray Each Nare BID   heparin injection (subcutaneous)  5,000 Units Subcutaneous Q8H   hydrALAZINE  100 mg Oral Q8H   insulin aspart  0-15 Units Subcutaneous TID WC   isosorbide mononitrate  120 mg Oral Daily   oxymetazoline  1 spray Each Nare BID   torsemide  80 mg Oral BID    labetalol, melatonin, mouth rinse

## 2022-05-26 NOTE — Progress Notes (Signed)
PROGRESS NOTE    Micheal Levy  [id number] DOB: 1980-12-15 DOA: 05/22/2022 PCP: [name], NP   Brief Narrative: Micheal Levy is a 42 y.o. male with a history of CKD stage IIIb, chronic diastolic heart failure, hypertension. Patient presented secondary to increased leg edema and dyspnea and found to have hypertensive emergency. Patient required ICU admission and Cardene drip. Home antihypertensives restarted.   Assessment and Plan:  Hypertensive emergency Secondary to medication nonadherence. Blood pressure with SBP up to 255 in the ED. Associated troponin leak. Patient started on Cardene drip and home antihypertensives restarted. Cardene drip weaned off and patient transferred out of the ICU on 2/17. Blood pressure better controlled but still elevated -Continue Coreg, amlodipine, clonidine, hydralazine, Imdur -Hold losartan secondary to AKI -Trend BP  Diabetes mellitus type 2 Uncontrolled with hyperglycemia. Most current hemoglobin A1C of 8.3%. Patient is managed on insuline glargine and glipizide as an outpatient. -Continue SSI  Chronic heart failure failure with preserved EF Transthoracic Echocardiogram obtained this admission significant for an LVEF of 60-65%. Losartan held secondary to presumed AKI. -Hold torsemide secondary to worsening creatinine  AKI on CKD stage 3b Prior known creatinine of 2.7. Patient follows with nephrology as an outpatient. Creatinine of 3.55 on admission. Unclear if this is AKI or progression of kidney disease. Creatinine is worsening on continued diuresis. Losartan held.  -Nephrology recommendation: Lasix 120 mg BID, metolazone 10 mg daily -Daily weights/strict in/out   DVT prophylaxis: Heparin subq Code Status:   Code Status: Full Code Family Communication: None at bedside Disposition Plan: Discharge pending stability/improvement of creatinine/AKI and nephrology recommendations   Consultants:  PCCM Nephrology  Procedures:   None  Antimicrobials: None    Subjective: Patient without issues from overnight.  Objective: BP (!) 144/97 (BP Location: Right Arm)   Pulse 67   Temp (!) 97.4 F (36.3 C) (Oral)   Resp (!) 23   Ht 5' 8.5" (1.74 m)   Wt 120.4 kg   SpO2 98%   BMI 39.77 kg/m   Examination:  General exam: Appears calm and comfortable Respiratory system: Clear to auscultation. Respiratory effort normal. Cardiovascular system: S1 & S2 heard, RRR. Gastrointestinal system: Abdomen is nondistended, soft and nontender. No organomegaly or masses felt. Normal bowel sounds heard. Central nervous system: Alert and oriented. No focal neurological deficits. Musculoskeletal: BLE 1-2+ pitting edema. No calf tenderness Skin: No cyanosis. No rashes Psychiatry: Judgement and insight appear normal. Mood & affect appropriate.   Data Reviewed: I have personally reviewed following labs and imaging studies  CBC Lab Results  Component Value Date   WBC 10.3 05/22/2022   RBC 4.84 05/22/2022   HGB 12.8 (L) 05/22/2022   HCT 41.3 05/22/2022   MCV 85.3 05/22/2022   MCH 26.4 05/22/2022   PLT 353 05/22/2022   MCHC 31.0 05/22/2022   RDW 16.0 (H) 05/22/2022   LYMPHSABS 1.9 05/22/2022   MONOABS 0.5 05/22/2022   EOSABS 0.1 05/22/2022   BASOSABS 0.1 [id number]     Last metabolic panel Lab Results  Component Value Date   NA 135 05/26/2022   K 3.3 (L) 05/26/2022   CL 99 05/26/2022   CO2 24 05/26/2022   BUN 39 (H) 05/26/2022   CREATININE 3.92 (H) 05/26/2022   GLUCOSE 171 (H) 05/26/2022   GFRNONAA 19 (L) 05/26/2022   GFRAA 60 11/11/2019   CALCIUM 8.2 (L) 05/26/2022   PHOS 4.3 12/25/2021   PROT 6.5 05/22/2022   ALBUMIN 2.8 (L) 05/22/2022   LABGLOB  2.9 05/28/2021   AGRATIO 1.5 11/11/2019   BILITOT 1.0 05/22/2022   ALKPHOS 65 05/22/2022   AST 23 05/22/2022   ALT 16 05/22/2022   ANIONGAP 12 05/26/2022    GFR: Estimated Creatinine Clearance: 30.9 mL/min (A) (by C-G formula based on SCr of 3.92 mg/dL  (H)).  Recent Results (from the past 240 hour(s))  MRSA Next Gen by PCR, Nasal     Status: None   Collection Time: 05/23/22  3:53 AM   Specimen: Nasal Mucosa; Nasal Swab  Result Value Ref Range Status   MRSA by PCR Next Gen NOT DETECTED NOT DETECTED Final    Comment: (NOTE) The GeneXpert MRSA Assay (FDA approved for NASAL specimens only), is one component of a comprehensive MRSA colonization surveillance program. It is not intended to diagnose MRSA infection nor to guide or monitor treatment for MRSA infections. Test performance is not FDA approved in patients less than 9 years old. Performed at [facility], [place] [address] [place], [place] [address]       Radiology Studies: DG Chest 2 View  Result Date: 05/25/2022 CLINICAL DATA:  [id number] Acute renal failure superimposed on stage 4 chronic kidney disease [facility]) [id number] Uncontrolled hypertension [id number] Follow-up exam [id number] Pulmonary edema [id number] EXAM: CHEST - 2 VIEW COMPARISON:  05/22/2022 FINDINGS: Frontal and lateral views of the chest demonstrate continued enlargement of the cardiac silhouette. No change in the vascular congestion, bilateral ground-glass airspace disease, and small bilateral pleural effusions since prior study. No pneumothorax. No acute bony abnormalities. IMPRESSION: 1. Stable pulmonary edema. No change in volume status since prior study. Electronically Signed   By: Randa Ngo M.D.   On: 05/25/2022 17:12   US RENAL  Result Date: 05/25/2022 CLINICAL DATA:  Acute on chronic renal failure. EXAM: RENAL / URINARY TRACT ULTRASOUND COMPLETE COMPARISON:  Renal ultrasound 05/20/2021 FINDINGS: Right Kidney: Renal measurements: 11.4 x 5.1 x 5.7 cm = volume: 174.9 mL. Diffuse increased echogenicity and renal cortical thinning suggesting medical renal disease. No hydronephrosis or renal mass or renal calculi. Left Kidney: Renal measurements: 11.4 x 5.4 x 5.6 cm = volume: 179.1 mL.  Diffuse increased echogenicity and renal cortical thinning suggesting medical renal disease. No renal lesions or hydronephrosis. Bladder: Appears normal for degree of bladder distention. Other: None. IMPRESSION: 1. Diffuse increased echogenicity and renal cortical thinning involving both kidneys compatible with medical renal disease. 2. No renal lesions or hydronephrosis. 3. Normal bladder. Electronically Signed   By: Marijo Sanes M.D.   On: 05/25/2022 14:07      LOS: 3 days    Cordelia Poche, MD [facility] 05/26/2022, 12:04 PM   If 7PM-7AM, please contact night-coverage [email or web address]

## 2022-05-27 DIAGNOSIS — I161 Hypertensive emergency: Secondary | ICD-10-CM | POA: Diagnosis not present

## 2022-05-27 DIAGNOSIS — N179 Acute kidney failure, unspecified: Secondary | ICD-10-CM | POA: Diagnosis not present

## 2022-05-27 LAB — GLUCOSE, CAPILLARY
Glucose-Capillary: 121 mg/dL — ABNORMAL HIGH (ref 70–99)
Glucose-Capillary: 113 mg/dL — ABNORMAL HIGH (ref 70–99)
Glucose-Capillary: 170 mg/dL — ABNORMAL HIGH (ref 70–99)
Glucose-Capillary: 147 mg/dL — ABNORMAL HIGH (ref 70–99)

## 2022-05-27 LAB — BASIC METABOLIC PANEL
Anion gap: 12 (ref 5–15)
BUN: 45 mg/dL — ABNORMAL HIGH (ref 6–20)
CO2: 25 mmol/L (ref 22–32)
Calcium: 8.9 mg/dL (ref 8.9–10.3)
Chloride: 97 mmol/L — ABNORMAL LOW (ref 98–111)
Creatinine, Ser: 4.37 mg/dL — ABNORMAL HIGH (ref 0.61–1.24)
GFR, Estimated: 16 mL/min — ABNORMAL LOW (ref >60)
Glucose, Bld: 137 mg/dL — ABNORMAL HIGH (ref 70–99)
Potassium: 3.5 mmol/L (ref 3.5–5.1)
Sodium: 134 mmol/L — ABNORMAL LOW (ref 135–145)

## 2022-05-27 MED ORDER — FUROSEMIDE 10 MG/ML IJ SOLN
100.0000 mg | Freq: Two times a day (BID) | INTRAVENOUS | Status: DC
  Administered 2022-05-27 – 2022-05-28 (×2): 100 mg via INTRAVENOUS
  Filled 2022-05-27 (×4): qty 10

## 2022-05-27 NOTE — Progress Notes (Signed)
[facility] Progress Note  Subjective: UOP 4000 cc yest and [id number] today so far. No new c/o  Vitals:   05/27/22 0353 05/27/22 0901 05/27/22 1237 05/27/22 1323  BP: (!) 125/98 (!) 125/99 119/62 121/64  Pulse: (!) 55 60 (!) 58   Resp: 20 20 20   $ Temp: 97.6 F (36.4 C)  (!) 97.4 F (36.3 C)   TempSrc: Oral  Oral   SpO2: 96% 97% 98%   Weight: 116 kg     Height:        Exam: Gen alert, no distress No jvd or bruits Chest rales bilat bases RRR no MRG Abd soft ntnd no mass or ascites +bs Ext diffuse LE edema 1-2+ Neuro is alert, Ox 3 , nf       Home meds include - norvasc 10, coreg 25 bid, clonidine 0.3 tid, hydralazine 100 tid, insulin glargine, imdur, losartan 50 qd, torsemide 80 qd, aspirin, glipizide, prns/ vits/ supps        Date                          Creat               eGFR    2017                         1.23    2018                         1.30- 2.86    2019                         1.22- 1.30    2021                         1.59- 2.75    2022                         1.65- 2.92    Jan 2023                  1.80- 2.73    2/01- 05/11/21            3.03- 3.35    2/11- 06/08/21            1.54- 4.15    06/24/21                     2.45    Sept 2023                 2.41- 2.76        28- 33 ml/min    2/15                          3.55    2/16                          3.40         2/17                          3.66    2/18  3.93                 19 ml/min      B/l creat 2.4- 2.8 from sept 2023, eGFR 28- 33 ml/min     UA 2/15 - prot >300, 0-5 rbc/ wbc      CXR 2/15 - showed bilat pulm edema / vasc congestion R > L       I/O = 3.2 L in and 3.1 L out = net zero      Na 135 K 3.5 CO2 24  BUN 38  creat 3.16 May 2021 admit --> 3 wks diuresed 120kg down to 102kg, max IV lasix 120-160 bid, had 4-5 days CRRT as well            Assessment/ Plan: AKI on CKD 3b - b/l creat 2.4- 2.8 from sept 2023, eGFR 28- 33 ml/min. F/b Dr. Carolin Sicks  at [facility]. Creat here was 3.5 on admission in setting of severe HTN, LE edema and pulm edema/ SOB. Got 1 dose IV lasix and IV cardene then home po BP meds and BP has come under control but creat bumped to 3.9 on 2/18. SOB a bit better, CXR 2/18 still+ edema and pt still feels swollen and not voiding a lot. AKI suspected due to decomp CHF vs progression of CKD (less likely). 1 year ago he was in for vol ^^ and w/ diuresis + CRRT he went from 120kg to 102kg at dc after 3 wks. Started IV lasix at 120 mg bid w/ po metolazone 35m qd yest on 2/19. Good UOP 4 L yesterday, 2.9 L so far today. Creat bump to mid 4s, will cut back slightly on lasix to 1070mIV bid, cont 1062metolazone qd. No uremic signs, no indication for RRT. Will follow.  HTNsive emergency - good BPs 138/80 range.  Getting home meds coreg, norvasc and clonidine. Losartan is on hold.  DM2 - per pmd A/C diast CHF - as above         Micheal Levy Splinter CKA 05/27/2022, 1:23 PM  Recent Labs  Lab 05/22/22 2217 05/23/22 0655 05/26/22 0901 05/27/22 0531  HGB 12.8*  --   --   --   ALBUMIN 2.8*  --   --   --   CALCIUM 8.5*   < > 8.2* 8.9  CREATININE 3.55*   < > 3.92* 4.37*  K 2.7*   < > 3.3* 3.5   < > = values in this interval not displayed.    No results for input(s): "IRON", "TIBC", "FERRITIN" in the last 168 hours. Inpatient medications:  amLODipine  10 mg Oral Daily   aspirin EC  81 mg Oral Daily   carvedilol  25 mg Oral BID WC   cloNIDine  0.3 mg Oral TID   fluticasone  2 spray Each Nare BID   heparin injection (subcutaneous)  5,000 Units Subcutaneous Q8H   hydrALAZINE  100 mg Oral Q8H   insulin aspart  0-15 Units Subcutaneous TID WC   isosorbide mononitrate  120 mg Oral Daily   metolazone  10 mg Oral Daily    furosemide 120 mg (05/27/22 1124)   labetalol, melatonin, mouth rinse

## 2022-05-27 NOTE — Progress Notes (Addendum)
PROGRESS NOTE    Micheal Levy  [id number] DOB: 1980-10-21 DOA: 05/22/2022 PCP: [name], NP   Brief Narrative: Micheal Levy is a 42 y.o. male with a history of CKD stage IIIb, chronic diastolic heart failure, hypertension. Patient presented secondary to increased leg edema and dyspnea and found to have hypertensive emergency. Patient required ICU admission and Cardene drip. Home antihypertensives restarted. Hospitalization complicated by progressively worsening AKI.   Assessment and Plan:  Hypertensive emergency Secondary to medication nonadherence. Blood pressure with SBP up to 255 in the ED. Associated troponin leak. Patient started on Cardene drip and home antihypertensives restarted. Cardene drip weaned off and patient transferred out of the ICU on 2/17. Blood pressure better controlled but still elevated -Continue Coreg, amlodipine, clonidine, hydralazine, Imdur -Hold losartan secondary to AKI -Trend BP  Diabetes mellitus type 2 Uncontrolled with hyperglycemia. Most current hemoglobin A1C of 8.3%. Patient is managed on insuline glargine and glipizide as an outpatient. -Continue SSI  Chronic heart failure failure with preserved EF Transthoracic Echocardiogram obtained this admission significant for an LVEF of 60-65%. Losartan held secondary to presumed AKI. -Hold torsemide secondary to worsening creatinine  AKI on CKD stage 3b Prior known creatinine of 2.7. Patient follows with nephrology as an outpatient. Creatinine of 3.55 on admission. Unclear if this is AKI or progression of kidney disease. Creatinine continues to worsen on continued diuresis but also in setting of significantly lowered (normotensive) blood pressures. Losartan held.  -Nephrology recommendation (2/19): Lasix 120 mg BID, metolazone 10 mg daily. Recommendations pending today -Daily weights/strict in/out   DVT prophylaxis: Heparin subq Code Status:   Code Status: Full Code Family  Communication: Wife at bedside Disposition Plan: Discharge pending stability/improvement of creatinine/AKI and nephrology recommendations   Consultants:  PCCM Nephrology  Procedures:  None  Antimicrobials: None    Subjective: Patient reports no issues overnight. Urinating well.  Objective: BP (!) 125/99 (BP Location: Right Arm)   Pulse 60   Temp 97.6 F (36.4 C) (Oral)   Resp 20   Ht 5' 8.5" (1.74 m)   Wt 116 kg   SpO2 97%   BMI 38.32 kg/m   Examination:  General exam: Appears calm and comfortable Respiratory system: Clear to auscultation. Respiratory effort normal. Cardiovascular system: S1 & S2 heard, RRR.  Gastrointestinal system: Abdomen is nondistended, soft and nontender. Normal bowel sounds heard. Central nervous system: Alert and oriented. No focal neurological deficits. Musculoskeletal: BLE pitting edema. No calf tenderness Skin: No cyanosis. No rashes Psychiatry: Judgement and insight appear normal. Mood & affect appropriate.   Data Reviewed: I have personally reviewed following labs and imaging studies  CBC Lab Results  Component Value Date   WBC 10.3 05/22/2022   RBC 4.84 05/22/2022   HGB 12.8 (L) 05/22/2022   HCT 41.3 05/22/2022   MCV 85.3 05/22/2022   MCH 26.4 05/22/2022   PLT 353 05/22/2022   MCHC 31.0 05/22/2022   RDW 16.0 (H) 05/22/2022   LYMPHSABS 1.9 05/22/2022   MONOABS 0.5 05/22/2022   EOSABS 0.1 05/22/2022   BASOSABS 0.1 Q000111Q     Last metabolic panel Lab Results  Component Value Date   NA 134 (L) 05/27/2022   K 3.5 05/27/2022   CL 97 (L) 05/27/2022   CO2 25 05/27/2022   BUN 45 (H) 05/27/2022   CREATININE 4.37 (H) 05/27/2022   GLUCOSE 137 (H) 05/27/2022   GFRNONAA 16 (L) 05/27/2022   GFRAA 60 11/11/2019   CALCIUM 8.9 05/27/2022   PHOS  4.3 12/25/2021   PROT 6.5 05/22/2022   ALBUMIN 2.8 (L) 05/22/2022   LABGLOB 2.9 05/28/2021   AGRATIO 1.5 11/11/2019   BILITOT 1.0 05/22/2022   ALKPHOS 65 05/22/2022   AST 23  05/22/2022   ALT 16 05/22/2022   ANIONGAP 12 05/27/2022    GFR: Estimated Creatinine Clearance: 27.2 mL/min (A) (by C-G formula based on SCr of 4.37 mg/dL (H)).  Recent Results (from the past 240 hour(s))  MRSA Next Gen by PCR, Nasal     Status: None   Collection Time: 05/23/22  3:53 AM   Specimen: Nasal Mucosa; Nasal Swab  Result Value Ref Range Status   MRSA by PCR Next Gen NOT DETECTED NOT DETECTED Final    Comment: (NOTE) The GeneXpert MRSA Assay (FDA approved for NASAL specimens only), is one component of a comprehensive MRSA colonization surveillance program. It is not intended to diagnose MRSA infection nor to guide or monitor treatment for MRSA infections. Test performance is not FDA approved in patients less than 24 years old. 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 No change in the vascular congestion, bilateral ground-glass airspace disease, and small bilateral pleural effusions since prior study. No pneumothorax. No acute bony abnormalities. IMPRESSION: 1. Stable pulmonary edema. No change in volume status since prior study. Electronically Signed   By: Randa Ngo M.D.   On: 05/25/2022 17:12   US RENAL  Result Date: 05/25/2022 CLINICAL DATA:  Acute on chronic renal failure. EXAM: RENAL / URINARY TRACT ULTRASOUND COMPLETE COMPARISON:  Renal ultrasound 05/20/2021 FINDINGS: Right Kidney: Renal measurements: 11.4 x 5.1 x 5.7 cm = volume: 174.9 mL. Diffuse increased echogenicity and renal cortical thinning  suggesting medical renal disease. No hydronephrosis or renal mass or renal calculi. Left Kidney: Renal measurements: 11.4 x 5.4 x 5.6 cm = volume: 179.1 mL. Diffuse increased echogenicity and renal cortical thinning suggesting medical renal disease. No renal lesions or hydronephrosis. Bladder: Appears normal for degree of bladder distention. Other: None. IMPRESSION: 1. Diffuse increased echogenicity and renal cortical thinning involving both kidneys compatible with medical renal disease. 2. No renal lesions or hydronephrosis. 3. Normal bladder. Electronically Signed   By: Marijo Sanes M.D.   On: 05/25/2022 14:07      LOS: 4 days    Cordelia Poche, MD [facility] 05/27/2022, 12:26 PM   If 7PM-7AM, please contact night-coverage [email or web address]

## 2022-05-28 ENCOUNTER — Inpatient Hospital Stay (HOSPITAL_COMMUNITY): Payer: Medicaid Other

## 2022-05-28 DIAGNOSIS — E1165 Type 2 diabetes mellitus with hyperglycemia: Secondary | ICD-10-CM

## 2022-05-28 DIAGNOSIS — E669 Obesity, unspecified: Secondary | ICD-10-CM

## 2022-05-28 DIAGNOSIS — I10 Essential (primary) hypertension: Secondary | ICD-10-CM | POA: Diagnosis not present

## 2022-05-28 DIAGNOSIS — N179 Acute kidney failure, unspecified: Secondary | ICD-10-CM | POA: Diagnosis not present

## 2022-05-28 DIAGNOSIS — N1832 Chronic kidney disease, stage 3b: Secondary | ICD-10-CM

## 2022-05-28 DIAGNOSIS — I161 Hypertensive emergency: Secondary | ICD-10-CM | POA: Diagnosis not present

## 2022-05-28 LAB — CBC WITH DIFFERENTIAL/PLATELET
Abs Immature Granulocytes: 0.04 10*3/uL (ref 0.00–0.07)
Basophils Absolute: 0.1 10*3/uL (ref 0.0–0.1)
Basophils Relative: 1 %
Eosinophils Absolute: 0.1 10*3/uL (ref 0.0–0.5)
Eosinophils Relative: 1 %
HCT: 39.4 % (ref 39.0–52.0)
Hemoglobin: 12.1 g/dL — ABNORMAL LOW (ref 13.0–17.0)
Immature Granulocytes: 1 %
Lymphocytes Relative: 30 %
Lymphs Abs: 1.8 10*3/uL (ref 0.7–4.0)
MCH: 26.5 pg (ref 26.0–34.0)
MCHC: 30.7 g/dL (ref 30.0–36.0)
MCV: 86.2 fL (ref 80.0–100.0)
Monocytes Absolute: 0.5 10*3/uL (ref 0.1–1.0)
Monocytes Relative: 9 %
Neutro Abs: 3.5 10*3/uL (ref 1.7–7.7)
Neutrophils Relative %: 58 %
Platelets: 361 10*3/uL (ref 150–400)
RBC: 4.57 MIL/uL (ref 4.22–5.81)
RDW: 16.4 % — ABNORMAL HIGH (ref 11.5–15.5)
WBC: 6 10*3/uL (ref 4.0–10.5)
nRBC: 0 % (ref 0.0–0.2)

## 2022-05-28 LAB — BASIC METABOLIC PANEL
Anion gap: 13 (ref 5–15)
BUN: 50 mg/dL — ABNORMAL HIGH (ref 6–20)
CO2: 29 mmol/L (ref 22–32)
Calcium: 8.9 mg/dL (ref 8.9–10.3)
Chloride: 93 mmol/L — ABNORMAL LOW (ref 98–111)
Creatinine, Ser: 4.61 mg/dL — ABNORMAL HIGH (ref 0.61–1.24)
GFR, Estimated: 15 mL/min — ABNORMAL LOW (ref >60)
Glucose, Bld: 130 mg/dL — ABNORMAL HIGH (ref 70–99)
Potassium: 3.1 mmol/L — ABNORMAL LOW (ref 3.5–5.1)
Sodium: 135 mmol/L (ref 135–145)

## 2022-05-28 LAB — GLUCOSE, CAPILLARY
Glucose-Capillary: 137 mg/dL — ABNORMAL HIGH (ref 70–99)
Glucose-Capillary: 158 mg/dL — ABNORMAL HIGH (ref 70–99)
Glucose-Capillary: 168 mg/dL — ABNORMAL HIGH (ref 70–99)
Glucose-Capillary: 121 mg/dL — ABNORMAL HIGH (ref 70–99)

## 2022-05-28 LAB — MAGNESIUM: Magnesium: 2.1 mg/dL (ref 1.7–2.4)

## 2022-05-28 LAB — PHOSPHORUS: Phosphorus: 6.3 mg/dL — ABNORMAL HIGH (ref 2.5–4.6)

## 2022-05-28 MED ORDER — FUROSEMIDE 10 MG/ML IJ SOLN
80.0000 mg | Freq: Two times a day (BID) | INTRAMUSCULAR | Status: DC
  Administered 2022-05-28 – 2022-05-29 (×3): 80 mg via INTRAVENOUS
  Filled 2022-05-28 (×3): qty 8

## 2022-05-28 MED ORDER — POTASSIUM CHLORIDE CRYS ER 20 MEQ PO TBCR
40.0000 meq | EXTENDED_RELEASE_TABLET | Freq: Once | ORAL | Status: AC
  Administered 2022-05-28: 40 meq via ORAL
  Filled 2022-05-28: qty 2

## 2022-05-28 NOTE — Progress Notes (Signed)
PROGRESS NOTE    Micheal Levy  [id number] DOB: February 19, 1981 DOA: 05/22/2022 PCP: [name], NP   Brief Narrative:  Micheal Levy is a 42 y.o. male with a history of CKD stage IIIb, chronic diastolic heart failure, hypertension. Patient presented secondary to increased leg edema and dyspnea and found to have hypertensive emergency. Patient required ICU admission and Cardene drip. Home antihypertensives restarted. Hospitalization complicated by progressively worsening AKI and now Nephrology has been consulted and he is getting aggressive Diuresis and making good urine output but despite this Renal Fxn continues to slowly worsen.  Assessment and Plan: No notes have been filed under this hospital service. Service: Hospitalist  Hypertensive Emergency, improving  -Secondary to medication nonadherence. -Blood pressure with SBP up to 255 in the ED. Associated troponin leak.  -Patient started on Cardene drip and home antihypertensives restarted. Cardene drip weaned off and patient transferred out of the ICU on 2/17.  -Blood pressure better controlled but still elevated -Continue Carvedilol 25 mg po BID, Amlodipine 10 mg po Daily, Clonidine 0.3 mg po TID, Hydralazine 100 mg po q8h, Isosorbide Mononitrate 120 mg po Daily -C/w Diuresis per Nephrology with Furosemide 100 mg IV q12h and po Metolazone 10 mg po Daily -Holding Losartan secondary to AKI -C/w IV Labetalol 10 mg IV q4hprn HBP for SBP >180 -Continue to Monitor BP per Protocol -Last BP reading was elevated still at 160/105   Diabetes Mellitus Type 2 -Uncontrolled with hyperglycemia.  -Most current hemoglobin A1C of 8.3%. -Patient is managed on insuline glargine and glipizide as an outpatient. -Continue Moderate Novolog SSI AC and CBGs ranging from 113-170   Acute on Chronic heart failure failure with preserved EF -BNP on admission was 613.1 -Transthoracic Echocardiogram obtained this admission significant for an LVEF of 60-65%.  Losartan held secondary to presumed AKI. -Hold torsemide secondary to worsening creatinine but he is getting IV Diuresis and Metolazone -Strict I's and O's and Daily Weights; Patient is -87.625 Liters since Admission and Weight is down from 265 lb -> 244 lbs -Continue to Monitor for S/Sx of Volume Overload  -Repeat CXR this AM pending to be done     AKI on CKD Stage 3b Hyperphosphatemia -Baseline Creatinine ranging from 2.4-2.8 since September 2023 -BUN/Cr and Phos Level Trend: Recent Labs  Lab 05/23/22 0655 05/23/22 1338 05/24/22 0856 05/25/22 0511 05/26/22 0901 05/27/22 0531 05/28/22 0524  BUN 24* 26* 33* 38* 39* 45* 50*  CREATININE 3.40* 3.49* 3.66* 3.93* 3.92* 4.37* 4.61*  PHOS  --   --   --   --   --   --  6.3*  -Nephrology consulted for further evaluation and management; He follows up with Dr. Carolin Sicks at [facility] in the outpatient setting  -Avoid Nephrotoxic Medications, Contrast Dyes, Hypotension and Dehydration to Ensure Adequate Renal Perfusion and will need to Renally Adjust Meds - Intake/Output Summary (Last 24 hours) at 05/28/2022 1234 Last data filed at 05/28/2022 1200 Gross per 24 hour  Intake 720 ml  Output 4075 ml  Net -3355 ml   -On Highdose IV Lasix 100 mg IV BID (Reduced from 120 mg po BID) and Metolazone 10 mg qdaily -Continue to Monitor and Trend Renal Function carefully and repeat CMP in the AM   Normocytic Anemia/Anemia of Chronic Kidney Disease  -Hgb/Hct Trend: Recent Labs  Lab 05/22/22 2217 05/28/22 1001  HGB 12.8* 12.1*  HCT 41.3 39.4  MCV 85.3 86.2  -Check Anemia Panel in the AM -Continue to Monitor for S/Sx  of Bleeding; No overt bleeding noted -Repeat CBC in the AM   Hypokalemia -Patient's K+ Level Trend: Recent Labs  Lab 05/23/22 0655 05/23/22 1338 05/24/22 0856 05/25/22 0511 05/26/22 0901 05/27/22 0531 05/28/22 0524  K 2.7* 3.1* 3.6 3.5 3.3* 3.5 3.1*  -Likely worsening in the setting of IV Diruersis -Replete  with po Kcl 40 mEQ x1 -Continue to Monitor and Replete as Necessary -Repeat CMP in the AM   Obesity -Complicates overall prognosis and care -Estimated body mass index is 36.63 kg/m as calculated from the following:   Height as of this encounter: 5' 8.5" (1.74 m).   Weight as of this encounter: 110.9 kg.  -Weight Loss and Dietary Counseling given  DVT prophylaxis: heparin injection 5,000 Units Start: 05/23/22 1400    Code Status: Full Code Family Communication: Family at bedside asleep on the bench   Disposition Plan:  Level of care: Telemetry Status is: Inpatient Remains inpatient appropriate because: Needs further improvement in BP and clearance by Nephrology; Renal Fxn continues to worsen slowly    Consultants:  PCCM Transfer Nephrology   Procedures:  As delineated as above   Antimicrobials:  Anti-infectives (From admission, onward)    None       Subjective: Seen and examined at bedside and he was doing okay and thinks his shortness of breath was doing much better.  States he is feeling a lot better and closer to his baseline.  This is legs are mildly swollen.  Denies any lightheadedness or dizziness.  Renal function continues with slightly worsening.  No other concerns or complaints at this time.  Objective: Vitals:   05/28/22 0554 05/28/22 0652 05/28/22 0900 05/28/22 1145  BP: (!) 156/112 (!) 150/100 (!) 148/94 (!) 160/105  Pulse: 62 (!) 58 65 70  Resp: 20  20 20  $ Temp: 98 F (36.7 C)  97.9 F (36.6 C) 97.9 F (36.6 C)  TempSrc: Oral  Oral   SpO2: 98%  98% 99%  Weight: 110.9 kg     Height:        Intake/Output Summary (Last 24 hours) at 05/28/2022 1234 Last data filed at 05/28/2022 1200 Gross per 24 hour  Intake 720 ml  Output 4075 ml  Net -3355 ml   Filed Weights   05/26/22 0550 05/27/22 0353 05/28/22 0554  Weight: 120.4 kg 116 kg 110.9 kg   Examination: Physical Exam:  Constitutional: WN/WD obese African-American male currently no acute  distress Respiratory: Diminished to auscultation bilaterally, no wheezing, rales, rhonchi or crackles. Normal respiratory effort and patient is not tachypenic. No accessory muscle use.  Unlabored breathing and not wearing supplemental oxygen nasal cannula Cardiovascular: RRR, no murmurs / rubs / gallops. S1 and S2 auscultated.  Mild lower extremity edema Abdomen: Soft, non-tender, distended secondary to body habitus. Bowel sounds positive.  GU: Deferred. Musculoskeletal: No clubbing / cyanosis of digits/nails. No joint deformity upper and lower extremities Skin: No rashes, lesions, ulcers limited skin evaluation. No induration; Warm and dry.  Neurologic: CN 2-12 grossly intact with no focal deficits. Romberg sign and cerebellar reflexes not assessed.  Psychiatric: Normal judgment and insight. Alert and oriented x 3. Normal mood and appropriate affect.   Data Reviewed: I have personally reviewed following labs and imaging studies  CBC: Recent Labs  Lab 05/22/22 2217 05/28/22 1001  WBC 10.3 6.0  NEUTROABS 7.7 3.5  HGB 12.8* 12.1*  HCT 41.3 39.4  MCV 85.3 86.2  PLT 353 [id number]   Basic Metabolic Panel: Recent Labs  Lab  05/22/22 2217 05/23/22 [id number] 05/24/22 0856 05/25/22 0511 05/26/22 0901 05/27/22 0531 05/28/22 0524  NA 140   < > 138 135 135 134* 135  K 2.7*   < > 3.6 3.5 3.3* 3.5 3.1*  CL 101   < > 102 100 99 97* 93*  CO2 27   < > 24 24 24 25 29  $ GLUCOSE 158*   < > 149* 181* 171* 137* 130*  BUN 24*   < > 33* 38* 39* 45* 50*  CREATININE 3.55*   < > 3.66* 3.93* 3.92* 4.37* 4.61*  CALCIUM 8.5*   < > 8.6* 8.3* 8.2* 8.9 8.9  MG 2.1  --   --   --   --   --  2.1  PHOS  --   --   --   --   --   --  6.3*   < > = values in this interval not displayed.   GFR: Estimated Creatinine Clearance: 25.2 mL/min (A) (by C-G formula based on SCr of 4.61 mg/dL (H)). Liver Function Tests: Recent Labs  Lab 05/22/22 2217  AST 23  ALT 16  ALKPHOS 65  BILITOT 1.0  PROT 6.5  ALBUMIN 2.8*   No  results for input(s): "LIPASE", "AMYLASE" in the last 168 hours. No results for input(s): "AMMONIA" in the last 168 hours. Coagulation Profile: No results for input(s): "INR", "PROTIME" in the last 168 hours. Cardiac Enzymes: No results for input(s): "CKTOTAL", "CKMB", "CKMBINDEX", "TROPONINI" in the last 168 hours. BNP (last 3 results) No results for input(s): "PROBNP" in the last 8760 hours. HbA1C: No results for input(s): "HGBA1C" in the last 72 hours. CBG: Recent Labs  Lab 05/27/22 1140 05/27/22 1647 05/27/22 2021 05/28/22 0728 05/28/22 1140  GLUCAP 113* 170* 147* 137* 158*   Lipid Profile: No results for input(s): "CHOL", "HDL", "LDLCALC", "TRIG", "CHOLHDL", "LDLDIRECT" in the last 72 hours. Thyroid Function Tests: No results for input(s): "TSH", "T4TOTAL", "FREET4", "T3FREE", "THYROIDAB" in the last 72 hours. Anemia Panel: No results for input(s): "VITAMINB12", "FOLATE", "FERRITIN", "TIBC", "IRON", "RETICCTPCT" in the last 72 hours. Sepsis Labs: No results for input(s): "PROCALCITON", "LATICACIDVEN" in the last 168 hours.  Recent Results (from the past 240 hour(s))  MRSA Next Gen by PCR, Nasal     Status: None   Collection Time: 05/23/22  3:53 AM   Specimen: Nasal Mucosa; Nasal Swab  Result Value Ref Range Status   MRSA by PCR Next Gen NOT DETECTED NOT DETECTED Final    Comment: (NOTE) The GeneXpert MRSA Assay (FDA approved for NASAL specimens only), is one component of a comprehensive MRSA colonization surveillance program. It is not intended to diagnose MRSA infection nor to guide or monitor treatment for MRSA infections. Test performance is not FDA approved in patients less than 22 years old. Performed at [facility], [place] [address], [place], [place] [address]     Radiology Studies: No results found.  Scheduled Meds:  amLODipine  10 mg Oral Daily   aspirin EC  81 mg Oral Daily   carvedilol  25 mg Oral BID WC   cloNIDine  0.3 mg Oral TID    fluticasone  2 spray Each Nare BID   heparin injection (subcutaneous)  5,000 Units Subcutaneous Q8H   hydrALAZINE  100 mg Oral Q8H   insulin aspart  0-15 Units Subcutaneous TID WC   isosorbide mononitrate  120 mg Oral Daily   metolazone  10 mg Oral Daily   Continuous Infusions:  furosemide  100 mg (05/28/22 1148)    LOS: 5 days   Raiford Noble, DO [facility] Available via Epic secure chat 7am-7pm After these hours, please refer to coverage provider listed on [email or web address] 05/28/2022, 12:34 PM

## 2022-05-28 NOTE — Progress Notes (Signed)
[facility] Progress Note  Subjective: another 4L UOP yesterday, creat bumped up slightly to 4.61.   Vitals:   05/28/22 0554 05/28/22 0652 05/28/22 0900 05/28/22 1145  BP: (!) 156/112 (!) 150/100 (!) 148/94 (!) 160/105  Pulse: 62 (!) 58 65 70  Resp: 20  20 20  $ Temp: 98 F (36.7 C)  97.9 F (36.6 C) 97.9 F (36.6 C)  TempSrc: Oral  Oral   SpO2: 98%  98% 99%  Weight: 110.9 kg     Height:        Exam: Gen alert, no distress No jvd or bruits Chest rales bilat bases RRR no MRG Abd soft ntnd no mass or ascites +bs Ext diffuse LE edema 1-2+ Neuro is alert, Ox 3 , nf       Home meds include - norvasc 10, coreg 25 bid, clonidine 0.3 tid, hydralazine 100 tid, insulin glargine, imdur, losartan 50 qd, torsemide 80 qd, aspirin, glipizide, prns/ vits/ supps        Date                          Creat               eGFR    2017                         1.23    2018                         1.30- 2.86    2019                         1.22- 1.30    2021                         1.59- 2.75    2022                         1.65- 2.92    Jan 2023                  1.80- 2.73    2/01- 05/11/21            3.03- 3.35    2/11- 06/08/21            1.54- 4.15    06/24/21                     2.45    Sept 2023                 2.41- 2.76        28- 33 ml/min    2/15                          3.55    2/16                          3.40         2/17                          3.66    2/18  3.93                 19 ml/min      B/l creat 2.4- 2.8 from sept 2023, eGFR 28- 33 ml/min     UA 2/15 - prot >300, 0-5 rbc/ wbc      CXR 2/15 - showed bilat pulm edema / vasc congestion R > L       I/O = 3.2 L in and 3.1 L out = net zero      Na 135 K 3.5 CO2 24  BUN 38  creat 3.16 May 2021 admit --> 3 wks diuresed 120kg down to 102kg, max IV lasix 120-160 bid, had 4-5 days CRRT as well            Assessment/ Plan: AKI on CKD 3b - b/l creat 2.4- 2.8 from sept 2023, eGFR  28- 33 ml/min. F/b Dr. Carolin Sicks at [facility]. Creat here was 3.5 on admission in setting of severe HTN, LE edema and pulm edema/ SOB. Got 1 dose IV lasix and IV cardene then home po BP meds and BP has come under control but creat bumped to 3.9 on 2/18. SOB a bit better, CXR 2/18 still+ edema and pt still feels swollen and not voiding a lot. AKI suspected due to decomp CHF vs progression of CKD (less likely). 1 year ago he was in for vol ^^ and w/ diuresis + CRRT he went from 120kg to 102kg at dc after 3 wks. Started IV lasix at 120 mg bid w/ po metolazone 79m on 2/19 w/ excellent response (>4 L UOP/ d). Creat remains to mid 4s, have lowered the dose a bit each day, is now on 851mbid IV. Wts down to 110kg. Feels a lot better. Prob still has 5- 10 kg on. Cont diuresis.  HTNsive emergency - good BPs 138/80 range.  Getting home meds coreg, norvasc and clonidine. Losartan is on hold.  DM2 - per pmd A/C diast CHF - as above         RoKelly SplinterD CKA 05/28/2022, 4:04 PM  Recent Labs  Lab 05/22/22 2217 05/23/22 0655 05/27/22 0531 05/28/22 0524 05/28/22 1001  HGB 12.8*  --   --   --  12.1*  ALBUMIN 2.8*  --   --   --   --   CALCIUM 8.5*   < > 8.9 8.9  --   PHOS  --   --   --  6.3*  --   CREATININE 3.55*   < > 4.37* 4.61*  --   K 2.7*   < > 3.5 3.1*  --    < > = values in this interval not displayed.    No results for input(s): "IRON", "TIBC", "FERRITIN" in the last 168 hours. Inpatient medications:  amLODipine  10 mg Oral Daily   aspirin EC  81 mg Oral Daily   carvedilol  25 mg Oral BID WC   cloNIDine  0.3 mg Oral TID   fluticasone  2 spray Each Nare BID   furosemide  80 mg Intravenous Q12H   heparin injection (subcutaneous)  5,000 Units Subcutaneous Q8H   hydrALAZINE  100 mg Oral Q8H   insulin aspart  0-15 Units Subcutaneous TID WC   isosorbide mononitrate  120 mg Oral Daily   metolazone  10 mg Oral Daily     labetalol, melatonin, mouth rinse

## 2022-05-29 LAB — IRON AND TIBC
Iron: 61 ug/dL (ref 45–182)
Saturation Ratios: 15 % — ABNORMAL LOW (ref 17.9–39.5)
TIBC: 398 ug/dL (ref 250–450)
UIBC: 337 ug/dL

## 2022-05-29 LAB — CBC WITH DIFFERENTIAL/PLATELET
Abs Immature Granulocytes: 0.03 10*3/uL (ref 0.00–0.07)
Basophils Absolute: 0 10*3/uL (ref 0.0–0.1)
Basophils Relative: 1 %
Eosinophils Absolute: 0.1 10*3/uL (ref 0.0–0.5)
Eosinophils Relative: 2 %
HCT: 42 % (ref 39.0–52.0)
Hemoglobin: 12.6 g/dL — ABNORMAL LOW (ref 13.0–17.0)
Immature Granulocytes: 1 %
Lymphocytes Relative: 33 %
Lymphs Abs: 1.7 10*3/uL (ref 0.7–4.0)
MCH: 26.5 pg (ref 26.0–34.0)
MCHC: 30 g/dL (ref 30.0–36.0)
MCV: 88.2 fL (ref 80.0–100.0)
Monocytes Absolute: 0.5 10*3/uL (ref 0.1–1.0)
Monocytes Relative: 10 %
Neutro Abs: 2.7 10*3/uL (ref 1.7–7.7)
Neutrophils Relative %: 53 %
Platelets: 362 10*3/uL (ref 150–400)
RBC: 4.76 MIL/uL (ref 4.22–5.81)
RDW: 16.2 % — ABNORMAL HIGH (ref 11.5–15.5)
WBC: 5.1 10*3/uL (ref 4.0–10.5)
nRBC: 0 % (ref 0.0–0.2)

## 2022-05-29 LAB — GLUCOSE, CAPILLARY
Glucose-Capillary: 161 mg/dL — ABNORMAL HIGH (ref 70–99)
Glucose-Capillary: 117 mg/dL — ABNORMAL HIGH (ref 70–99)
Glucose-Capillary: 194 mg/dL — ABNORMAL HIGH (ref 70–99)
Glucose-Capillary: 134 mg/dL — ABNORMAL HIGH (ref 70–99)

## 2022-05-29 LAB — VITAMIN B12: Vitamin B-12: 385 pg/mL (ref 180–914)

## 2022-05-29 LAB — FERRITIN: Ferritin: 73 ng/mL (ref 24–336)

## 2022-05-29 LAB — COMPREHENSIVE METABOLIC PANEL
ALT: 12 U/L (ref 0–44)
AST: 22 U/L (ref 15–41)
Albumin: 2.9 g/dL — ABNORMAL LOW (ref 3.5–5.0)
Alkaline Phosphatase: 53 U/L (ref 38–126)
Anion gap: 14 (ref 5–15)
BUN: 52 mg/dL — ABNORMAL HIGH (ref 6–20)
CO2: 30 mmol/L (ref 22–32)
Calcium: 9 mg/dL (ref 8.9–10.3)
Chloride: 90 mmol/L — ABNORMAL LOW (ref 98–111)
Creatinine, Ser: 4.48 mg/dL — ABNORMAL HIGH (ref 0.61–1.24)
GFR, Estimated: 16 mL/min — ABNORMAL LOW (ref >60)
Glucose, Bld: 138 mg/dL — ABNORMAL HIGH (ref 70–99)
Potassium: 3.3 mmol/L — ABNORMAL LOW (ref 3.5–5.1)
Sodium: 134 mmol/L — ABNORMAL LOW (ref 135–145)
Total Bilirubin: 0.9 mg/dL (ref 0.3–1.2)
Total Protein: 6.6 g/dL (ref 6.5–8.1)

## 2022-05-29 LAB — RETICULOCYTES
Immature Retic Fract: 15 % (ref 2.3–15.9)
RBC.: 4.69 MIL/uL (ref 4.22–5.81)
Retic Count, Absolute: 118.7 10*3/uL (ref 19.0–186.0)
Retic Ct Pct: 2.5 % (ref 0.4–3.1)

## 2022-05-29 LAB — FOLATE: Folate: 9.9 ng/mL (ref >5.9)

## 2022-05-29 LAB — PHOSPHORUS: Phosphorus: 6.7 mg/dL — ABNORMAL HIGH (ref 2.5–4.6)

## 2022-05-29 LAB — MAGNESIUM: Magnesium: 2.3 mg/dL (ref 1.7–2.4)

## 2022-05-29 MED ORDER — POTASSIUM CHLORIDE CRYS ER 20 MEQ PO TBCR
30.0000 meq | EXTENDED_RELEASE_TABLET | Freq: Once | ORAL | Status: AC
  Administered 2022-05-29: 30 meq via ORAL
  Filled 2022-05-29: qty 1

## 2022-05-29 MED ORDER — POTASSIUM CHLORIDE CRYS ER 20 MEQ PO TBCR
40.0000 meq | EXTENDED_RELEASE_TABLET | Freq: Once | ORAL | Status: AC
  Administered 2022-05-29: 40 meq via ORAL
  Filled 2022-05-29: qty 2

## 2022-05-29 NOTE — Progress Notes (Signed)
PROGRESS NOTE    Micheal Levy  [id number] DOB: 1980/04/30 DOA: 05/22/2022 PCP: [name], NP   Brief Narrative:  Micheal Levy is a 42 y.o. male with a history of CKD stage IIIb, chronic diastolic heart failure, hypertension. Patient presented secondary to increased leg edema and dyspnea and found to have hypertensive emergency. Patient required ICU admission and Cardene drip. Home antihypertensives restarted. Hospitalization complicated by progressively worsening AKI and now Nephrology has been consulted and he is getting aggressive Diuresis and making good urine output.  Renal function is fine slightly improved with diuresis and nurse diuresis is being cut back to IV 80 mg twice daily  Assessment and Plan:  Hypertensive Emergency, improving  -Secondary to medication nonadherence. -Blood pressure with SBP up to 255 in the ED. Associated troponin leak.  -Patient started on Cardene drip and home antihypertensives restarted. Cardene drip weaned off and patient transferred out of the ICU on 2/17.  -Blood pressure better controlled but still elevated -Continue Carvedilol 25 mg po BID, Amlodipine 10 mg po Daily, Clonidine 0.3 mg po TID, Hydralazine 100 mg po q8h, Isosorbide Mononitrate 120 mg po Daily -C/w Diuresis per Nephrology with Furosemide 100 mg IV q12h and po Metolazone 10 mg po Daily -Holding Losartan secondary to AKI -C/w IV Labetalol 10 mg IV q4hprn HBP for SBP >180 -Continue to Monitor BP per Protocol -Last BP reading was elevated still at 160/107   Diabetes Mellitus Type 2 -Uncontrolled with hyperglycemia.  -Most current hemoglobin A1C of 8.3%. -Patient is managed on insuline glargine and glipizide as an outpatient. -Continue Moderate Novolog SSI AC and CBGs ranging from 121-168   Acute on Chronic heart failure failure with preserved EF -BNP on admission was 613.1 -Transthoracic Echocardiogram obtained this admission significant for an LVEF of 60-65%. Losartan held  secondary to presumed AKI. -Hold torsemide secondary to worsening creatinine but he is getting IV Diuresis and Metolazone -Strict I's and O's and Daily Weights; Patient is -12.890 Liters since Admission and Weight is down from 265 lb -> 244 lbs -Continue to Monitor for S/Sx of Volume Overload  -Repeat CXR this AM pending to be done     AKI on CKD Stage 3b Hyperphosphatemia -Baseline Creatinine ranging from 2.4-2.8 since September 2023 -BUN/Cr and Phos Level Trend: Recent Labs  Lab 05/23/22 1338 05/24/22 0856 05/25/22 0511 05/26/22 0901 05/27/22 0531 05/27/22 0531 05/28/22 0524 05/29/22 0538  BUN 26* 33* 38* 39* 45*  --  50* 52*  CREATININE 3.49* 3.66* 3.93* 3.92* 4.37*  --  4.61* 4.48*  PHOS  --   --   --   --   --    < > 6.3* 6.7*   < > = values in this interval not displayed.  -Nephrology consulted for further evaluation and management; He follows up with Dr. Carolin Sicks at [facility] in the outpatient setting  -Avoid Nephrotoxic Medications, Contrast Dyes, Hypotension and Dehydration to Ensure Adequate Renal Perfusion and will need to Renally Adjust Meds  Intake/Output Summary (Last 24 hours) at 05/29/2022 [id number] Last data filed at 05/29/2022 0741 Gross per 24 hour  Intake 650 ml  Output 5075 ml  Net -4425 ml   -On Highdose IV Lasix but this has been slowly being weaned by the nephrologist.  He is no longer on the IV 100 mg twice daily that he was on yesterday; now is on IV 80 mg twice daily and p.o. metolazone 10 mg -Continue to Monitor and Trend Renal Function carefully and  repeat CMP in the AM and nephrology feels that he still has quite a bit of fluid on him still feels that he is heavy 5 to 10 kg and recommending continue diuresis for now   Normocytic Anemia/Anemia of Chronic Kidney Disease  -Hgb/Hct Trend: Recent Labs  Lab 05/22/22 2217 05/28/22 1001 05/29/22 0538  HGB 12.8* 12.1* 12.6*  HCT 41.3 39.4 42.0  MCV 85.3 86.2 88.2  -Checked Anemia Panel and  showed an iron level of 61, UIBC of 337, TIBC 398, saturation ratios of 15%, ferritin level of 73, folate of 9.9, vitamin B12 385 -Continue to Monitor for S/Sx of Bleeding; No overt bleeding noted -Repeat CBC in the AM    Hypokalemia -Patient's K+ Level Trend: Recent Labs  Lab 05/23/22 1338 05/24/22 0856 05/25/22 0511 05/26/22 0901 05/27/22 0531 05/28/22 0524 05/29/22 0538  K 3.1* 3.6 3.5 3.3* 3.5 3.1* 3.3*  -Likely worsening in the setting of IV Diruersis -Replete with po Kcl 40 mEQ x1 -Continue to Monitor and Replete as Necessary -Repeat CMP in the AM    Obesity -Complicates overall prognosis and care -Estimated body mass index is 36.6 kg/m as calculated from the following:   Height as of this encounter: 5' 8.5" (1.74 m).   Weight as of this encounter: 110.8 kg.  -Weight Loss and Dietary Counseling given  DVT prophylaxis: heparin injection 5,000 Units Start: 05/23/22 1400    Code Status: Full Code Family Communication: No family currently at bedside  Disposition Plan:  Level of care: Telemetry Status is: Inpatient Remains inpatient appropriate because: He is slowly improving with diuresis but needs to be diuresed back to dry weight   Consultants:  PCCM transfer Nephrology  Procedures:  As delineated as above  Antimicrobials:  Anti-infectives (From admission, onward)    None       Subjective: Seen and examined at bedside and he feels that he is doing better and denies any shortness of breath.  Thinks his legs are not as swollen.  No nausea or vomiting.  Denies any lightheadedness or dizziness.  No other concerns voiced this time.  Objective: Vitals:   05/28/22 0900 05/28/22 1145 05/28/22 2236 05/29/22 0504  BP: (!) 148/94 (!) 160/105 (!) 143/90 (!) 160/107  Pulse: 65 70 (!) 56 (!) 58  Resp: 20 20  16  $ Temp: 97.9 F (36.6 C) 97.9 F (36.6 C) 98 F (36.7 C) 97.6 F (36.4 C)  TempSrc: Oral  Oral Oral  SpO2: 98% 99% 97% 97%  Weight:    110.8 kg   Height:        Intake/Output Summary (Last 24 hours) at 05/29/2022 0813 Last data filed at 05/29/2022 0741 Gross per 24 hour  Intake 650 ml  Output 5075 ml  Net -4425 ml   Filed Weights   05/27/22 0353 05/28/22 0554 05/29/22 0504  Weight: 116 kg 110.9 kg 110.8 kg   Examination: Physical Exam:  Constitutional: WN/WD obese African-American male in no acute distress Respiratory: Diminished to auscultation bilaterally, no wheezing, rales, rhonchi or crackles. Normal respiratory effort and patient is not tachypenic. No accessory muscle use.  Unlabored breathing and not wearing supplemental oxygen via cannula Cardiovascular: RRR, no murmurs / rubs / gallops. S1 and S2 auscultated.  Continues to have some slight lower extremity edema Abdomen: Soft, non-tender, distended secondary to body habitus.  Bowel sounds positive.  GU: Deferred. Musculoskeletal: No clubbing / cyanosis of digits/nails. No joint deformity upper and lower extremities. Skin: No rashes, lesions, ulcers on skin evaluation.  No induration; Warm and dry.  Neurologic: CN 2-12 grossly intact with no focal deficits. Romberg sign and cerebellar reflexes not assessed.  Psychiatric: Normal judgment and insight. Alert and oriented x 3. Normal mood and appropriate affect.   Data Reviewed: I have personally reviewed following labs and imaging studies  CBC: Recent Labs  Lab 05/22/22 2217 05/28/22 1001 05/29/22 0538  WBC 10.3 6.0 5.1  NEUTROABS 7.7 3.5 2.7  HGB 12.8* 12.1* 12.6*  HCT 41.3 39.4 42.0  MCV 85.3 86.2 88.2  PLT 353 361 [id number]   Basic Metabolic Panel: Recent Labs  Lab 05/22/22 2217 05/23/22 0655 05/25/22 0511 05/26/22 0901 05/27/22 0531 05/28/22 0524 05/29/22 0538  NA 140   < > 135 135 134* 135 134*  K 2.7*   < > 3.5 3.3* 3.5 3.1* 3.3*  CL 101   < > 100 99 97* 93* 90*  CO2 27   < > 24 24 25 29 30  $ GLUCOSE 158*   < > 181* 171* 137* 130* 138*  BUN 24*   < > 38* 39* 45* 50* 52*  CREATININE 3.55*   < > 3.93*  3.92* 4.37* 4.61* 4.48*  CALCIUM 8.5*   < > 8.3* 8.2* 8.9 8.9 9.0  MG 2.1  --   --   --   --  2.1 2.3  PHOS  --   --   --   --   --  6.3* 6.7*   < > = values in this interval not displayed.   GFR: Estimated Creatinine Clearance: 25.9 mL/min (A) (by C-G formula based on SCr of 4.48 mg/dL (H)). Liver Function Tests: Recent Labs  Lab 05/22/22 2217 05/29/22 0538  AST 23 22  ALT 16 12  ALKPHOS 65 53  BILITOT 1.0 0.9  PROT 6.5 6.6  ALBUMIN 2.8* 2.9*   No results for input(s): "LIPASE", "AMYLASE" in the last 168 hours. No results for input(s): "AMMONIA" in the last 168 hours. Coagulation Profile: No results for input(s): "INR", "PROTIME" in the last 168 hours. Cardiac Enzymes: No results for input(s): "CKTOTAL", "CKMB", "CKMBINDEX", "TROPONINI" in the last 168 hours. BNP (last 3 results) No results for input(s): "PROBNP" in the last 8760 hours. HbA1C: No results for input(s): "HGBA1C" in the last 72 hours. CBG: Recent Labs  Lab 05/27/22 2021 05/28/22 0728 05/28/22 1140 05/28/22 1637 05/28/22 2233  GLUCAP 147* 137* 158* 168* 121*   Lipid Profile: No results for input(s): "CHOL", "HDL", "LDLCALC", "TRIG", "CHOLHDL", "LDLDIRECT" in the last 72 hours. Thyroid Function Tests: No results for input(s): "TSH", "T4TOTAL", "FREET4", "T3FREE", "THYROIDAB" in the last 72 hours. Anemia Panel: Recent Labs    05/29/22 0538  VITAMINB12 385  FOLATE 9.9  FERRITIN 73  TIBC 398  IRON 61  RETICCTPCT 2.5   Sepsis Labs: No results for input(s): "PROCALCITON", "LATICACIDVEN" in the last 168 hours.  Recent Results (from the past 240 hour(s))  MRSA Next Gen by PCR, Nasal     Status: None   Collection Time: 05/23/22  3:53 AM   Specimen: Nasal Mucosa; Nasal Swab  Result Value Ref Range Status   MRSA by PCR Next Gen NOT DETECTED NOT DETECTED Final    Comment: (NOTE) The GeneXpert MRSA Assay (FDA approved for NASAL specimens only), is one component of a comprehensive MRSA colonization  surveillance program. It is not intended to diagnose MRSA infection nor to guide or monitor treatment for MRSA infections. Test performance is not FDA approved in patients less than 65 years old.  Performed at [facility], [place] [address], [place], [place] [address]     Radiology Studies: DG CHEST PORT 1 VIEW  Result Date: 05/28/2022 CLINICAL DATA:  Shortness of breath on exertion EXAM: PORTABLE CHEST 1 VIEW COMPARISON:  Portable exam 1049 hours compared to 05/25/2022 FINDINGS: Enlargement of cardiac silhouette with pulmonary vascular congestion. Mediastinal contours normal. Improved pulmonary edema since previous exam. No new consolidation, pleural effusion, or pneumothorax. Osseous structures unremarkable. IMPRESSION: Improved pulmonary edema. Electronically Signed   By: Lavonia Dana M.D.   On: 05/28/2022 13:33    Scheduled Meds:  amLODipine  10 mg Oral Daily   aspirin EC  81 mg Oral Daily   carvedilol  25 mg Oral BID WC   cloNIDine  0.3 mg Oral TID   fluticasone  2 spray Each Nare BID   furosemide  80 mg Intravenous Q12H   heparin injection (subcutaneous)  5,000 Units Subcutaneous Q8H   hydrALAZINE  100 mg Oral Q8H   insulin aspart  0-15 Units Subcutaneous TID WC   isosorbide mononitrate  120 mg Oral Daily   metolazone  10 mg Oral Daily   potassium chloride  40 mEq Oral Once   Continuous Infusions:   LOS: 6 days   Raiford Noble, DO [facility] Available via Epic secure chat 7am-7pm After these hours, please refer to coverage provider listed on [email or web address] 05/29/2022, 8:13 AM

## 2022-05-29 NOTE — Plan of Care (Signed)

## 2022-05-29 NOTE — Progress Notes (Addendum)
[facility] Progress Note  Subjective: another 4L UOP yesterday, creat down to 4.4 today. Pt feels a lot better.   Vitals:   05/29/22 0504 05/29/22 0916 05/29/22 1253 05/29/22 1323  BP: (!) 160/107 (!) 148/89 126/81   Pulse: (!) 58 72 62   Resp: 16  17   Temp: 97.6 F (36.4 C)  98.1 F (36.7 C)   TempSrc: Oral  Oral   SpO2: 97%  96%   Weight: 110.8 kg   108.7 kg  Height:        Exam: Gen alert, no distress No jvd or bruits Chest rales bilat bases RRR no MRG Abd soft ntnd no mass or ascites +bs Ext diffuse LE edema 1-2+ Neuro is alert, Ox 3 , nf       Home meds include - norvasc 10, coreg 25 bid, clonidine 0.3 tid, hydralazine 100 tid, insulin glargine, imdur, losartan 50 qd, torsemide 80 qd, aspirin, glipizide, prns/ vits/ supps        Date                          Creat               eGFR    2017                         1.23    2018                         1.30- 2.86    2019                         1.22- 1.30    2021                         1.59- 2.75    2022                         1.65- 2.92    Jan 2023                  1.80- 2.73    2/01- 05/11/21            3.03- 3.35    2/11- 06/08/21            1.54- 4.15    06/24/21                     2.45    Sept 2023                 2.41- 2.76        28- 33 ml/min    2/15                          3.55    2/16                          3.40         2/17                          3.66    2/18  3.93                 19 ml/min      B/l creat 2.4- 2.8 from sept 2023, eGFR 28- 33 ml/min     UA 2/15 - prot >300, 0-5 rbc/ wbc      CXR 2/15 - showed bilat pulm edema / vasc congestion R > L       I/O = 3.2 L in and 3.1 L out = net zero      Na 135 K 3.5 CO2 24  BUN 38  creat 3.16 May 2021 admit --> 3 wks diuresed 120kg down to 102kg, max IV lasix 120-160 bid, had 4-5 days CRRT as well            Assessment/ Plan: AKI on CKD 3b - b/l creat 2.4- 2.8 from sept 2023, eGFR 28- 33 ml/min.  F/b Dr. Carolin Sicks at [facility]. Creat here was 3.5 on admission in setting of severe HTN, LE edema and pulm edema/ SOB. Got 1 dose IV lasix and IV cardene then home po BP meds and BP has come under control but creat bumped to 3.9 on 2/18. SOB a bit better, CXR 2/18 still+ edema and pt still feels swollen and not voiding a lot. AKI suspected due to decomp CHF vs progression of CKD (less likely). 1 year ago he was in for vol ^^ and w/ diuresis + CRRT he went from 120kg to 102kg at dc after 3 wks. Started IV lasix at 120 mg bid w/ po metolazone '10mg'$  on 2/19 w/ excellent response (>4 L UOP/ d). Creat remains in the mid 4s, and we lowered the dose a bit each day, is now on '80mg'$  bid IV. Wts down to 239 lbs. His usual weight is around 225-235. Will cont diuresis another 24 hrs and may be ready to dc home tomorrow on home meds po torsemide.  HTNsive emergency - good BPs 138/80 range.  Getting home meds coreg, norvasc and clonidine. Losartan is on hold.  DM2 - per pmd A/C diast CHF - as above         Kelly Splinter MD [facility] 05/29/2022, 2:22 PM  Recent Labs  Lab 05/22/22 2217 05/23/22 0655 05/28/22 0524 05/28/22 1001 05/29/22 0538  HGB 12.8*  --   --  12.1* 12.6*  ALBUMIN 2.8*  --   --   --  2.9*  CALCIUM 8.5*   < > 8.9  --  9.0  PHOS  --   --  6.3*  --  6.7*  CREATININE 3.55*   < > 4.61*  --  4.48*  K 2.7*   < > 3.1*  --  3.3*   < > = values in this interval not displayed.    Recent Labs  Lab 05/29/22 0538  IRON 61  TIBC 398  FERRITIN 73   Inpatient medications:  amLODipine  10 mg Oral Daily   aspirin EC  81 mg Oral Daily   carvedilol  25 mg Oral BID WC   cloNIDine  0.3 mg Oral TID   fluticasone  2 spray Each Nare BID   furosemide  80 mg Intravenous Q12H   heparin injection (subcutaneous)  5,000 Units Subcutaneous Q8H   hydrALAZINE  100 mg Oral Q8H   insulin aspart  0-15 Units Subcutaneous TID WC   isosorbide mononitrate  120 mg Oral Daily   metolazone  10 mg Oral Daily   potassium chloride  30 mEq Oral Once     labetalol, melatonin, mouth rinse

## 2022-05-30 LAB — COMPREHENSIVE METABOLIC PANEL
ALT: 13 U/L (ref 0–44)
AST: 16 U/L (ref 15–41)
Albumin: 2.9 g/dL — ABNORMAL LOW (ref 3.5–5.0)
Alkaline Phosphatase: 53 U/L (ref 38–126)
Anion gap: 15 (ref 5–15)
BUN: 55 mg/dL — ABNORMAL HIGH (ref 6–20)
CO2: 29 mmol/L (ref 22–32)
Calcium: 9.1 mg/dL (ref 8.9–10.3)
Chloride: 89 mmol/L — ABNORMAL LOW (ref 98–111)
Creatinine, Ser: 4.44 mg/dL — ABNORMAL HIGH (ref 0.61–1.24)
GFR, Estimated: 16 mL/min — ABNORMAL LOW (ref >60)
Glucose, Bld: 128 mg/dL — ABNORMAL HIGH (ref 70–99)
Potassium: 3 mmol/L — ABNORMAL LOW (ref 3.5–5.1)
Sodium: 133 mmol/L — ABNORMAL LOW (ref 135–145)
Total Bilirubin: 0.8 mg/dL (ref 0.3–1.2)
Total Protein: 6.6 g/dL (ref 6.5–8.1)

## 2022-05-30 LAB — CBC WITH DIFFERENTIAL/PLATELET
Abs Immature Granulocytes: 0.02 10*3/uL (ref 0.00–0.07)
Basophils Absolute: 0 10*3/uL (ref 0.0–0.1)
Basophils Relative: 1 %
Eosinophils Absolute: 0.1 10*3/uL (ref 0.0–0.5)
Eosinophils Relative: 2 %
HCT: 41.2 % (ref 39.0–52.0)
Hemoglobin: 12.9 g/dL — ABNORMAL LOW (ref 13.0–17.0)
Immature Granulocytes: 0 %
Lymphocytes Relative: 37 %
Lymphs Abs: 1.9 10*3/uL (ref 0.7–4.0)
MCH: 26.7 pg (ref 26.0–34.0)
MCHC: 31.3 g/dL (ref 30.0–36.0)
MCV: 85.1 fL (ref 80.0–100.0)
Monocytes Absolute: 0.5 10*3/uL (ref 0.1–1.0)
Monocytes Relative: 11 %
Neutro Abs: 2.6 10*3/uL (ref 1.7–7.7)
Neutrophils Relative %: 49 %
Platelets: 381 10*3/uL (ref 150–400)
RBC: 4.84 MIL/uL (ref 4.22–5.81)
RDW: 15.9 % — ABNORMAL HIGH (ref 11.5–15.5)
WBC: 5.2 10*3/uL (ref 4.0–10.5)
nRBC: 0 % (ref 0.0–0.2)

## 2022-05-30 LAB — GLUCOSE, CAPILLARY
Glucose-Capillary: 134 mg/dL — ABNORMAL HIGH (ref 70–99)
Glucose-Capillary: 165 mg/dL — ABNORMAL HIGH (ref 70–99)

## 2022-05-30 LAB — MAGNESIUM: Magnesium: 2.2 mg/dL (ref 1.7–2.4)

## 2022-05-30 LAB — PHOSPHORUS: Phosphorus: 5.4 mg/dL — ABNORMAL HIGH (ref 2.5–4.6)

## 2022-05-30 MED ORDER — TORSEMIDE 40 MG PO TABS: 80.0000 mg | ORAL_TABLET | Freq: Every morning | ORAL | 0 refills | Status: DC

## 2022-05-30 MED ORDER — FLUTICASONE PROPIONATE 50 MCG/ACT NA SUSP: 2.0000 | Freq: Two times a day (BID) | NASAL | 0 refills | Status: AC

## 2022-05-30 MED ORDER — ASPIRIN EC 81 MG PO TBEC: 81.0000 mg | DELAYED_RELEASE_TABLET | Freq: Every day | ORAL | 0 refills | Status: AC

## 2022-05-30 MED ORDER — TORSEMIDE 20 MG PO TABS: 80.0000 mg | ORAL_TABLET | Freq: Every morning | ORAL | Status: DC

## 2022-05-30 MED ORDER — ALBUTEROL SULFATE HFA 108 (90 BASE) MCG/ACT IN AERS: 2.0000 | INHALATION_SPRAY | Freq: Four times a day (QID) | RESPIRATORY_TRACT | 12 refills | Status: AC | PRN

## 2022-05-30 MED ORDER — TORSEMIDE 20 MG PO TABS: 80.0000 mg | ORAL_TABLET | Freq: Every day | ORAL | Status: DC | PRN

## 2022-05-30 MED ORDER — TORSEMIDE 40 MG PO TABS: 80.0000 mg | ORAL_TABLET | Freq: Every day | ORAL | 0 refills | Status: DC | PRN

## 2022-05-30 MED ORDER — POTASSIUM CHLORIDE CRYS ER 20 MEQ PO TBCR: 40.0000 meq | EXTENDED_RELEASE_TABLET | Freq: Every day | ORAL | 0 refills | Status: DC

## 2022-05-30 MED ORDER — POTASSIUM CHLORIDE CRYS ER 20 MEQ PO TBCR
40.0000 meq | EXTENDED_RELEASE_TABLET | Freq: Two times a day (BID) | ORAL | Status: DC
  Administered 2022-05-30: 40 meq via ORAL
  Filled 2022-05-30: qty 2

## 2022-05-30 NOTE — Plan of Care (Signed)

## 2022-05-30 NOTE — Progress Notes (Signed)
  Transition of Care [facility]) Screening Note   Patient Details  Name: Micheal Levy Date of Birth: 1981-01-22   Transition of Care [facility]) CM/SW Contact:    Dessa Phi, RN Phone Number: 05/30/2022, 1:15 PM    Transition of Care Department [facility]) has reviewed patient and no TOC needs have been identified at this time. We will continue to monitor patient advancement through interdisciplinary progression rounds. If new patient transition needs arise, please place a TOC consult.

## 2022-05-30 NOTE — Progress Notes (Signed)
Micheal Levy Progress Note  Subjective: another 4L UOP yesterday, but wt's not dropping, must be drinking more, ready for dc  Vitals:   05/30/22 0500 05/30/22 0515 05/30/22 1053 05/30/22 1216  BP:  (!) 154/108 (!) 145/86 (!) 130/91  Pulse:  66 (!) 59 (!) 58  Resp:  '16 18 19  '$ Temp:  97.9 F (36.6 C) 98.3 F (36.8 C) 98.2 F (36.8 C)  TempSrc:  Oral Oral Oral  SpO2:  97% 95% 98%  Weight: 107.7 kg     Height:        Exam: Gen alert, no distress No jvd or bruits Chest rales bilat bases RRR no MRG Abd soft ntnd no mass or ascites +bs Ext diffuse LE edema 1-2+ Neuro is alert, Ox 3 , nf       Home meds include - norvasc 10, coreg 25 bid, clonidine 0.3 tid, hydralazine 100 tid, insulin glargine, imdur, losartan 50 qd, torsemide 80 qd, aspirin, glipizide, prns/ vits/ supps        Date                          Creat               eGFR    2017                         1.23    2018                         1.30- 2.86    2019                         1.22- 1.30    2021                         1.59- 2.75    2022                         1.65- 2.92    Jan 2023                  1.80- 2.73    2/01- 05/11/21            3.03- 3.35    2/11- 06/08/21            1.54- 4.15    06/24/21                     2.45    Sept 2023                 2.41- 2.76        28- 33 ml/min    2/15                          3.55    2/16                          3.40         2/17                          3.66    2/18  3.93                 19 ml/min      B/l creat 2.4- 2.8 from sept 2023, eGFR 28- 33 ml/min     UA 2/15 - prot >300, 0-5 rbc/ wbc      CXR 2/15 - showed bilat pulm edema / vasc congestion R > L       I/O = 3.2 L in and 3.1 L out = net zero      Na 135 K 3.5 CO2 24  BUN 38  creat 3.16 May 2021 admit --> 3 wks diuresed 120kg down to 102kg, max IV lasix 120-160 bid, had 4-5 days CRRT as well            Assessment/ Plan: AKI on CKD 3b - b/l creat 2.4- 2.8  from sept 2023, eGFR 28- 33 ml/min. F/b Dr. Carolin Sicks at [facility]. Creat here was 3.5 on admission in setting of severe HTN, LE edema and pulm edema/ SOB. Got 1 dose IV lasix and IV cardene then home po BP meds and BP has come under control but creat bumped to 3.9 on 2/18. SOB a bit better, CXR 2/18 still+ edema and pt still feels swollen and not voiding a lot. AKI suspected due to decomp CHF vs progression of CKD (less likely). 1 year ago he was in for vol ^^ and w/ diuresis + CRRT he went from 120kg to 102kg at dc after 3 wks. Started IV lasix at 120 mg bid w/ po metolazone '10mg'$  on 2/19 w/ excellent response (>4 L UOP/ d) and now pt is ready for dc. Creat in low 4s, may be new baseline. Office will contact pt to arrange f/u w/ Dr Carolin Sicks +/- [place]. See my orders for home torsemide dosing. Will sign off.  HTNsive emergency - good BPs 138/80 range.  Getting home meds coreg, norvasc and clonidine. Continue to hold Losartan at dc, we can resume it in OP setting if needed.   DM2 - per pmd A/C diast CHF - as above         Kelly Splinter MD [facility] 05/30/2022, 12:16 PM  Recent Labs  Lab 05/29/22 0538 05/30/22 0903  HGB 12.6* 12.9*  ALBUMIN 2.9* 2.9*  CALCIUM 9.0 9.1  PHOS 6.7* 5.4*  CREATININE 4.48* 4.44*  K 3.3* 3.0*    Recent Labs  Lab 05/29/22 0538  IRON 61  TIBC 398  FERRITIN 73    Inpatient medications:  amLODipine  10 mg Oral Daily   aspirin EC  81 mg Oral Daily   carvedilol  25 mg Oral BID WC   cloNIDine  0.3 mg Oral TID   fluticasone  2 spray Each Nare BID   heparin injection (subcutaneous)  5,000 Units Subcutaneous Q8H   hydrALAZINE  100 mg Oral Q8H   insulin aspart  0-15 Units Subcutaneous TID WC   isosorbide mononitrate  120 mg Oral Daily   potassium chloride  40 mEq Oral BID   [START ON 05/31/2022] torsemide  80 mg Oral q AM     labetalol, melatonin, mouth rinse, torsemide

## 2022-06-01 NOTE — Discharge Summary (Signed)
Physician Discharge Summary   Patient: Micheal Levy MRN: [id number] DOB: 1981/01/15  Admit date:     05/22/2022  Discharge date: 06/01/22  Discharge Physician: Micheal Noble, DO   PCP: Micheal Francois, NP   Recommendations at discharge:   Follow-up with PCP within 1 to 2 weeks, repeat CBC, CMP, mag, Phos within 1 week Follow-up with Nephrology in the outpatient setting within 1 to 2 weeks  Discharge Diagnoses: Principal Problem:   Hypertensive emergency Active Problems:   Chronic kidney disease, stage 3b ([place])   AKI (acute kidney injury) ([place])   Essential hypertension   Uncontrolled type 2 diabetes mellitus with hyperglycemia ([place])   Obesity (BMI 30-39.9)  Resolved Problems:   * No resolved hospital problems. *  Hospital Course: Micheal Levy is a 42 y.o. male with a history of CKD stage IIIb, chronic diastolic heart failure, hypertension. Patient presented secondary to increased leg edema and dyspnea and found to have hypertensive emergency. Patient required ICU admission and Cardene drip. Home antihypertensives restarted. Hospitalization complicated by progressively worsening AKI and now Nephrology has been consulted and he is getting aggressive Diuresis and making good urine output.  Renal function is fine slightly improved with diuresis and nurse diuresis is being cut back to IV 80 mg twice daily and he improved to the point of switching to po Torsemide. Nephrology feels he can be discharged with close Renal Follow up as he is medically stable  Assessment and Plan: Hypertensive Emergency, improving  -Secondary to medication nonadherence. -Blood pressure with SBP up to 255 in the ED. Associated troponin leak.  -Patient started on Cardene drip and home antihypertensives restarted. Cardene drip weaned off and patient transferred out of the ICU on 2/17.  -Blood pressure better controlled but still elevated -Continue Carvedilol 25 mg po BID, Amlodipine 10 mg po Daily, Clonidine  0.3 mg po TID, Hydralazine 100 mg po q8h, Isosorbide Mononitrate 120 mg po Daily -C/w Diuresis per Nephrology with Furosemide 100 mg IV q12h and po Metolazone 10 mg po Daily and change to po Torsemide and c/w po Kcl 40 mEQ -Holding Losartan secondary to AKI -C/w IV Labetalol 10 mg IV q4hprn HBP for SBP >180 -Continue to Monitor BP per Protocol -Last BP reading was elevated still at 160/107 -Follow up with Nephrology in the outpatient setting    Diabetes Mellitus Type 2 -Uncontrolled with hyperglycemia.  -Most current hemoglobin A1C of 8.3%. -Patient is managed on insuline glargine and glipizide as an outpatient. -Continue Moderate Novolog SSI AC and CBGs ranging from 117-165   Acute on Chronic heart failure failure with preserved EF -BNP on admission was 613.1 -Transthoracic Echocardiogram obtained this admission significant for an LVEF of 60-65%. Losartan held secondary to presumed AKI. -Hold torsemide secondary to worsening creatinine but he is getting IV Diuresis and Metolazone -Strict I's and O's and Daily Weights; Patient is -16.615 Liters since Admission and Weight is down from 265 lb -> 244 lbs -Continue to Monitor for S/Sx of Volume Overload  -Repeat CXR this AM pending to be done     AKI on CKD Stage 3b Hyperphosphatemia -Baseline Creatinine ranging from 2.4-2.8 since September 2023 -BUN/Cr and Phos Level Trend: Recent Labs  Lab 05/24/22 0856 05/25/22 0511 05/26/22 0901 05/27/22 0531 05/28/22 0524 05/29/22 0538 05/30/22 0903  BUN 33* 38* 39* 45* 50* 52* 55*  CREATININE 3.66* 3.93* 3.92* 4.37* 4.61* 4.48* 4.44*  -Nephrology consulted for further evaluation and management; He follows up with [facility]heal [facility] at [facility] in the outpatient setting  -Avoid Nephrotoxic Medications, Contrast Dyes, Hypotension and Dehydration to Ensure Adequate Renal Perfusion and will need to Renally Adjust Meds No intake or output data in the 24 hours ending 06/01/22 1959    -High-dose IV Lasix changed to p.o. Torsemide per nephrology recommendation to 80 daily and another 39 Paley iron if necessary -Continue to Monitor and Trend Renal Function carefully and repeat CMP in the outpatient setting given that he is now improved and closer to his dry weight.  His creatinine may now be around the low fours and he will need to follow-up with Micheal Levy in outpatient setting within 1 to 2 weeks -Continue to hold losartan at discharge and resume outpt if necessary  Normocytic Anemia/Anemia of Chronic Kidney Disease  -Hgb/Hct Trend: Recent Labs  Lab 05/22/22 2217 05/28/22 1001 05/29/22 0538 05/30/22 0903  HGB 12.8* 12.1* 12.6* 12.9*  HCT 41.3 39.4 42.0 41.2  MCV 85.3 86.2 88.2 85.1  -Checked Anemia Panel and showed an iron level of 61, UIBC of 337, TIBC 398, saturation ratios of 15%, ferritin level of 73, folate of 9.9, vitamin B12 385 -Continue to Monitor for S/Sx of Bleeding; No overt bleeding noted -Repeat CBC in the AM    Hypokalemia -Patient's K+ Level Trend: Recent Labs  Lab 05/24/22 0856 05/25/22 0511 05/26/22 0901 05/27/22 0531 05/28/22 0524 05/29/22 0538 05/30/22 0903  K 3.6 3.5 3.3* 3.5 3.1* 3.3* 3.0*  -Likely worsening in the setting of IV Diruersis -Replete with po Kcl 40 mEQ BID -Continue to Monitor and Replete as Necessary -Repeat CMP within 1 week   Obesity -Complicates overall prognosis and care -Estimated body mass index is 35.59 kg/m as calculated from the following:   Height as of this encounter: 5' 8.5" (1.74 m).   Weight as of this encounter: 107.7 kg.  -Weight Loss and Dietary Counseling given   Consultants: Nephrology Procedures performed: As delineated as above  Disposition: Home Diet recommendation:  Discharge Diet Orders (From admission, onward)     Start     Ordered   05/30/22 0000  Diet - low sodium heart healthy        05/30/22 1248   05/30/22 0000  Diet Carb Modified        05/30/22 1248           Renal  diet DISCHARGE MEDICATION: Allergies as of 05/30/2022   No Known Allergies      Medication List     STOP taking these medications    glipiZIDE 10 MG 24 hr tablet Commonly known as: Glucotrol XL   losartan 50 MG tablet Commonly known as: COZAAR       TAKE these medications    albuterol 108 (90 Base) MCG/ACT inhaler Commonly known as: VENTOLIN HFA Inhale 2 puffs into the lungs every 6 (six) hours as needed for wheezing or shortness of breath.   amLODipine 10 MG tablet Commonly known as: NORVASC Take 10 mg by mouth daily.   aspirin EC 81 MG tablet Take 1 tablet (81 mg total) by mouth daily.   carvedilol 25 MG tablet Commonly known as: COREG Take 25 mg by mouth 2 (two) times daily with a meal.   cloNIDine 0.3 MG tablet Commonly known as: CATAPRES Take 1 tablet (0.3 mg total) by mouth 3 (three) times daily.   fluticasone 50 MCG/ACT nasal spray Commonly known as: FLONASE Place 2 sprays into both nostrils 2 (two) times daily.   hydrALAZINE 100 MG tablet Commonly known  as: APRESOLINE Take 1 tablet (100 mg total) by mouth every 8 (eight) hours.   INSULIN SYRINGE .5CC/29G 29G X 1/2" 0.5 ML Misc Commonly known as: B-D INS SYR ULTRAFINE .5CC/29G 1 each by Does not apply route at bedtime.   isosorbide mononitrate 120 MG 24 hr tablet Commonly known as: IMDUR Take 1 tablet (120 mg total) by mouth daily.   Lancets Misc 1 each by Does not apply route 4 (four) times daily -  before meals and at bedtime.   Lantus SoloStar 100 UNIT/ML Solostar Pen Generic drug: insulin glargine Inject 5 Units into the skin daily at 10 pm.   Multivitamin Adults Tabs Take 1 tablet by mouth daily.   Pen Needles 31G X 6 MM Misc 1 each by Does not apply route at bedtime.   potassium chloride SA 20 MEQ tablet Commonly known as: KLOR-CON M Take 2 tablets (40 mEq total) by mouth daily.   senna-docusate 8.6-50 MG tablet Commonly known as: Senokot-S Take 1 tablet by mouth at bedtime as  needed for mild constipation.   Torsemide 40 MG Tabs Take 80 mg by mouth daily as needed (take extra '80mg'$  pill w/ supper if wt gain > 5 lbs; continue to take until excess weight is gone). What changed:  medication strength when to take this reasons to take this   Torsemide 40 MG Tabs Take 80 mg by mouth in the morning. What changed:  medication strength when to take this   True Metrix Air Glucose Meter Devi 1 each by Does not apply route 4 (four) times daily -  before meals and at bedtime.   True Metrix Blood Glucose Test test strip Generic drug: glucose blood Use as instructed What changed:  how much to take how to take this when to take this additional instructions       Discharge Exam: Filed Weights   05/29/22 0504 05/29/22 1323 05/30/22 0500  Weight: 110.8 kg 108.7 kg 107.7 kg   Vitals:   05/30/22 1053 05/30/22 1216  BP: (!) 145/86 (!) 130/91  Pulse: (!) 59 (!) 58  Resp: 18 19  Temp: 98.3 F (36.8 C) 98.2 F (36.8 C)  SpO2: 95% 98%   Examination: Physical Exam:  Constitutional: WN/WD obese AAM in NAD  Respiratory: Diminished to auscultation bilaterally, no wheezing, rales, rhonchi or crackles. Normal respiratory effort and patient is not tachypenic. No accessory muscle use.  Cardiovascular: RRR, no murmurs / rubs / gallops. S1 and S2 auscultated. Mild extremity edema.  Abdomen: Soft, non-tender, Distended 2/2 body habitus. Bowel sounds positive.  GU: Deferred. Musculoskeletal: No clubbing / cyanosis of digits/nails. No joint deformity upper and lower extremities.  Skin: No rashes, lesions, ulcers on a limited skin evaluation. No induration; Warm and dry.  Neurologic: CN 2-12 grossly intact with no focal deficits. Romberg sign and cerebellar reflexes not assessed.  Psychiatric: Normal judgment and insight. Alert and oriented x 3. Normal mood and appropriate affect.   Condition at discharge: stable  The results of significant diagnostics from this  hospitalization (including imaging, microbiology, ancillary and laboratory) are listed below for reference.   Imaging Studies: DG CHEST PORT 1 VIEW  Result Date: 05/28/2022 CLINICAL DATA:  Shortness of breath on exertion EXAM: PORTABLE CHEST 1 VIEW COMPARISON:  Portable exam 1049 hours compared to 05/25/2022 FINDINGS: Enlargement of cardiac silhouette with pulmonary vascular congestion. Mediastinal contours normal. Improved pulmonary edema since previous exam. No new consolidation, pleural effusion, or pneumothorax. Osseous structures unremarkable. IMPRESSION: Improved pulmonary edema. Electronically  Signed   By: Lavonia Dana M.D.   On: 05/28/2022 13:33   DG Chest 2 View  Result Date: 05/25/2022 CLINICAL DATA:  [id number] Acute renal failure superimposed on stage 4 chronic kidney disease [facility]) [id number] Uncontrolled hypertension [id number] Follow-up exam [id number] [phone] Pulmonary edema [id number] EXAM: CHEST - 2 VIEW COMPARISON:  05/22/2022 FINDINGS: Frontal and lateral views of the chest demonstrate continued enlargement of the cardiac silhouette. No change in the vascular congestion, bilateral ground-glass airspace disease, and small bilateral pleural effusions since prior study. No pneumothorax. No acute bony abnormalities. IMPRESSION: 1. Stable pulmonary edema. No change in volume status since prior study. Electronically Signed   By: Randa Ngo M.D.   On: 05/25/2022 17:12   US RENAL  Result Date: 05/25/2022 CLINICAL DATA:  Acute on chronic renal failure. EXAM: RENAL / URINARY TRACT ULTRASOUND COMPLETE COMPARISON:  Renal ultrasound 05/20/2021 FINDINGS: Right Kidney: Renal measurements: 11.4 x 5.1 x 5.7 cm = volume: 174.9 mL. Diffuse increased echogenicity and renal cortical thinning suggesting medical renal disease. No hydronephrosis or renal mass or renal calculi. Left Kidney: Renal measurements: 11.4 x 5.4 x 5.6 cm = volume: 179.1 mL. Diffuse increased echogenicity and renal cortical thinning  suggesting medical renal disease. No renal lesions or hydronephrosis. Bladder: Appears normal for degree of bladder distention. Other: None. IMPRESSION: 1. Diffuse increased echogenicity and renal cortical thinning involving both kidneys compatible with medical renal disease. 2. No renal lesions or hydronephrosis. 3. Normal bladder. Electronically Signed   By: Marijo Sanes M.D.   On: 05/25/2022 14:07   ECHOCARDIOGRAM LIMITED  Result Date: 05/23/2022    ECHOCARDIOGRAM LIMITED REPORT   Patient Name:   MALEKO PATE Date of Exam: 05/23/2022 Medical Rec #:  [id number]       Height:       68.5 in Accession #:    [id number]      Weight:       257.7 lb Date of Birth:  [birth year]       BSA:          2.289 m Patient Age:    29 years        BP:           135/67 mmHg Patient Gender: M               HR:           80 bpm. Exam Location:  Inpatient Procedure: Limited Echo, Cardiac Doppler and Limited Color Doppler Indications:    Acute ischemic heart disease  History:        Patient has prior history of Echocardiogram examinations.                 Stroke; Risk Factors:Diabetes and Hypertension.  Sonographer:    Phineas Douglas Referring Phys: [id number] [place]  1. Left ventricular ejection fraction, by estimation, is 65 to 70%. The left ventricle has normal function. The left ventricle has no regional wall motion abnormalities. There is severe left ventricular hypertrophy.  2. Right ventricular systolic function is normal. The right ventricular size is normal. There is mildly elevated pulmonary artery systolic pressure. The estimated right ventricular systolic pressure is 123XX123 mmHg.  3. The mitral valve is normal in structure. Trivial mitral valve regurgitation. No evidence of mitral stenosis.  4. The aortic valve is tricuspid. Aortic valve regurgitation is not visualized. No aortic stenosis is present.  5. The inferior vena cava is dilated in size with <50% respiratory variability,  suggesting right atrial  pressure of 15 mmHg. Comparison(s): No significant change from prior study. Prior images reviewed side by side. Conclusion(s)/Recommendation(s): Findings consistent with hypertrophic cardiomyopathy. FINDINGS  Left Ventricle: Left ventricular ejection fraction, by estimation, is 65 to 70%. The left ventricle has normal function. The left ventricle has no regional wall motion abnormalities. The left ventricular internal cavity size was normal in size. There is  severe left ventricular hypertrophy. Right Ventricle: The right ventricular size is normal. No increase in right ventricular wall thickness. Right ventricular systolic function is normal. There is mildly elevated pulmonary artery systolic pressure. The tricuspid regurgitant velocity is 2.46  m/s, and with an assumed right atrial pressure of 15 mmHg, the estimated right ventricular systolic pressure is 123XX123 mmHg. Left Atrium: Left atrial size was normal in size. Right Atrium: Right atrial size was normal in size. Pericardium: There is no evidence of pericardial effusion. Mitral Valve: The mitral valve is normal in structure. Trivial mitral valve regurgitation. No evidence of mitral valve stenosis. Tricuspid Valve: The tricuspid valve is normal in structure. Tricuspid valve regurgitation is trivial. No evidence of tricuspid stenosis. Aortic Valve: The aortic valve is tricuspid. Aortic valve regurgitation is not visualized. No aortic stenosis is present. Pulmonic Valve: The pulmonic valve was normal in structure. Pulmonic valve regurgitation is not visualized. No evidence of pulmonic stenosis. Aorta: The aortic root is normal in size and structure. Venous: The inferior vena cava is dilated in size with less than 50% respiratory variability, suggesting right atrial pressure of 15 mmHg. IAS/Shunts: No atrial level shunt detected by color flow Doppler. LEFT VENTRICLE PLAX 2D LVIDd:         4.50 cm LVIDs:         3.20 cm LV PW:         2.20 cm LV IVS:        1.90 cm   LV Volumes (MOD) LV vol d, MOD A2C: 106.0 ml LV vol d, MOD A4C: 108.0 ml LV vol s, MOD A2C: 48.7 ml LV vol s, MOD A4C: 49.6 ml LV SV MOD A2C:     57.3 ml LV SV MOD A4C:     108.0 ml LV SV MOD BP:      61.1 ml IVC IVC diam: 3.00 cm TRICUSPID VALVE TR Peak grad:   24.2 mmHg TR Vmax:        246.00 cm/s Candee Furbish MD Electronically signed by Candee Furbish MD Signature Date/Time: 05/23/2022/3:26:35 PM    Final    DG Chest 2 View  Result Date: 05/22/2022 CLINICAL DATA:  Shortness of breath EXAM: CHEST - 2 VIEW COMPARISON:  12/22/2021 FINDINGS: Cardiomegaly with vascular congestion, small pleural effusions and pulmonary edema. No consolidation or pneumothorax. IMPRESSION: Cardiomegaly with vascular congestion, small pleural effusions and pulmonary edema. Electronically Signed   By: Donavan Foil M.D.   On: 05/22/2022 21:37    Microbiology: Results for orders placed or performed during the hospital encounter of 05/22/22  MRSA Next Gen by PCR, Nasal     Status: None   Collection Time: 05/23/22  3:53 AM   Specimen: Nasal Mucosa; Nasal Swab  Result Value Ref Range Status   MRSA by PCR Next Gen NOT DETECTED NOT DETECTED Final    Comment: (NOTE) The GeneXpert MRSA Assay (FDA approved for NASAL specimens only), is one component of a comprehensive MRSA colonization surveillance program. It is not intended to diagnose MRSA infection nor to guide or monitor treatment for MRSA infections. Test performance is  not FDA approved in patients less than 61 years old. Performed at [facility], [place] [address], [place], [place] [address]    Labs: CBC: Recent Labs  Lab 05/28/22 1001 05/29/22 0538 05/30/22 0903  WBC 6.0 5.1 5.2  NEUTROABS 3.5 2.7 2.6  HGB 12.1* 12.6* 12.9*  HCT 39.4 42.0 41.2  MCV 86.2 88.2 85.1  PLT 361 362 123XX123   Basic Metabolic Panel: Recent Labs  Lab 05/26/22 0901 05/27/22 0531 05/28/22 0524 05/29/22 0538 05/30/22 0903  NA 135 134* 135 134* 133*  K 3.3* 3.5 3.1*  3.3* 3.0*  CL 99 97* 93* 90* 89*  CO2 '24 25 29 30 29  '$ GLUCOSE 171* 137* 130* 138* 128*  BUN 39* 45* 50* 52* 55*  CREATININE 3.92* 4.37* 4.61* 4.48* 4.44*  CALCIUM 8.2* 8.9 8.9 9.0 9.1  MG  --   --  2.1 2.3 2.2  PHOS  --   --  6.3* 6.7* 5.4*   Liver Function Tests: Recent Labs  Lab 05/29/22 0538 05/30/22 0903  AST 22 16  ALT 12 13  ALKPHOS 53 53  BILITOT 0.9 0.8  PROT 6.6 6.6  ALBUMIN 2.9* 2.9*   CBG: Recent Labs  Lab 05/29/22 1250 05/29/22 1639 05/29/22 2025 05/30/22 0735 05/30/22 1219  GLUCAP 117* 194* 134* 134* 165*   Discharge time spent: greater than 30 minutes.  Signed: Raiford Noble, DO [facility] 06/01/2022

## 2022-06-02 ENCOUNTER — Telehealth: Payer: Self-pay

## 2022-06-02 ENCOUNTER — Other Ambulatory Visit: Payer: Self-pay | Admitting: Nurse Practitioner

## 2022-06-02 NOTE — Progress Notes (Unsigned)
{  Select_TRH_Note:[id number]} 

## 2022-06-02 NOTE — Transitions of Care (Post Inpatient/ED Visit) (Signed)
   06/02/2022  Name: Micheal Levy MRN: [id number] DOB: 11/12/1980  Today's TOC FU Call Status: Today's TOC FU Call Status:: Successful TOC FU Call Competed TOC FU Call Complete Date: 06/02/22  Transition Care Management Follow-up Telephone Call Date of Discharge: 05/30/22 Discharge Facility: [name]) Type of Discharge: Inpatient Admission Primary Inpatient Discharge Diagnosis:: Hypertensive emergency How have you been since you were released from the hospital?: Better Any questions or concerns?: No  Items Reviewed: Did you receive and understand the discharge instructions provided?: Yes Medications obtained and verified?: Yes (Medications Reviewed) Any new allergies since your discharge?: No Dietary orders reviewed?: Yes Do you have support at home?: Yes  Home Care and Equipment/Supplies: [place] Ordered?: No Any new equipment or medical supplies ordered?: No  Functional Questionnaire: Do you need assistance with bathing/showering or dressing?: No Do you need assistance with meal preparation?: No Do you need assistance with eating?: No Do you have difficulty maintaining continence: No Do you need assistance with getting out of bed/getting out of a chair/moving?: No Do you have difficulty managing or taking your medications?: No  Folllow up appointments reviewed: PCP Follow-up appointment confirmed?: No (pt will call to schedule fu appt - has been awhile since last seen) MD Provider Line Number:[phone] Given: Yes [facility] Follow-up appointment confirmed?: No Do you need transportation to your follow-up appointment?: No Do you understand care options if your condition(s) worsen?: Yes-patient verbalized understanding    [place] LPN [place] Direct Dial [phone]

## 2022-06-24 IMAGING — CT CT CHEST W/O CM
2 of 4 series · 15 of 36 positions shown, 18 images · non-contrast
Comparison: 05/21/2021

CLINICAL DATA: Re-evaluation of bilateral pneumonia.



[Series 3: chest wo · axial · 0.84mm/px · z∈[-750,-440]mm · 12 of 185 slices shown, 15 images]
[im 15/185  mediastinal]
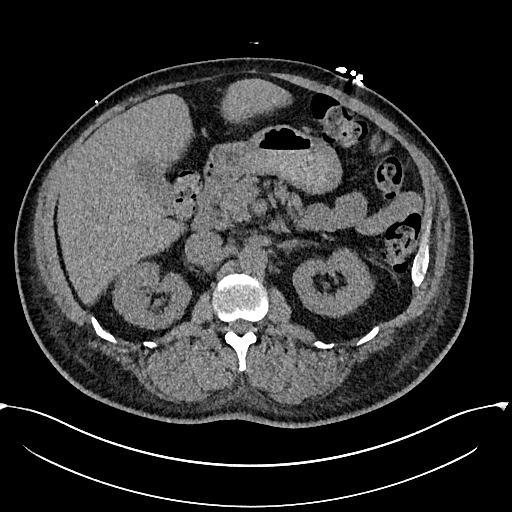
[im 15/185  lung]
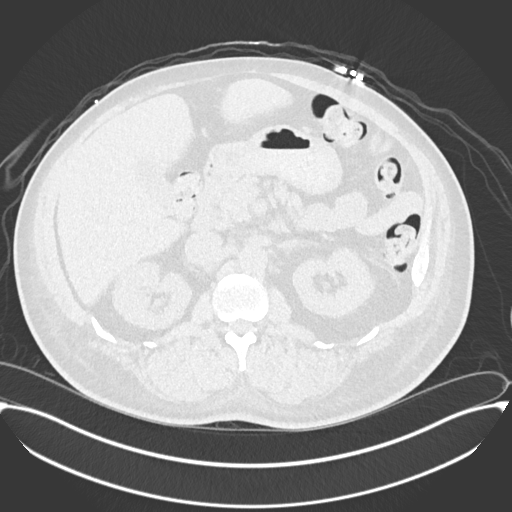
[im 29/185  lung]
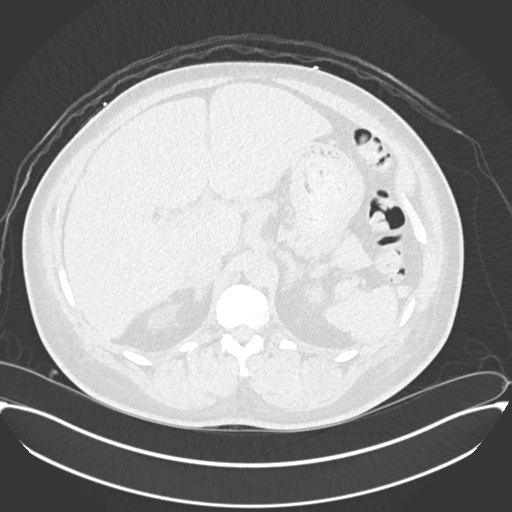
[im 43/185  lung]
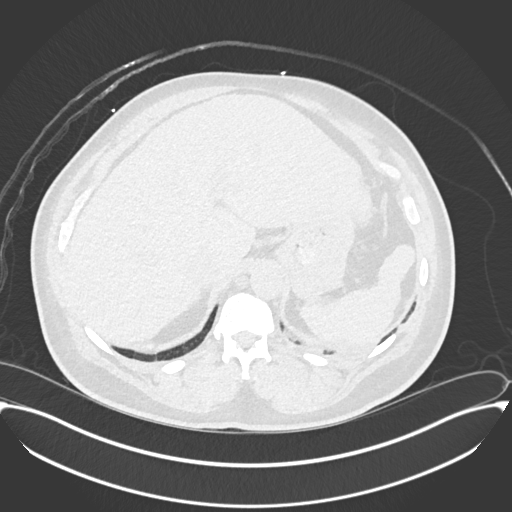
[im 57/185  lung]
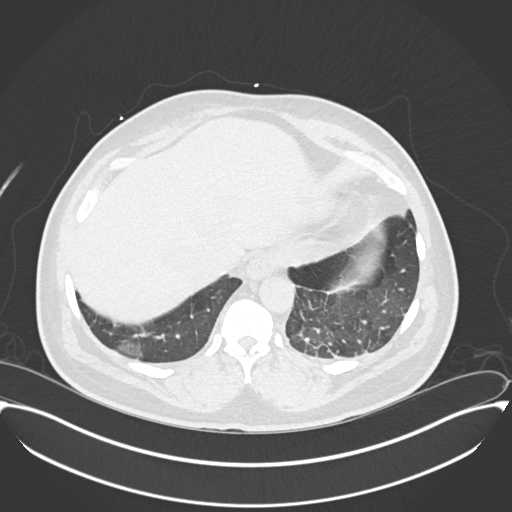
[im 71/185  mediastinal]
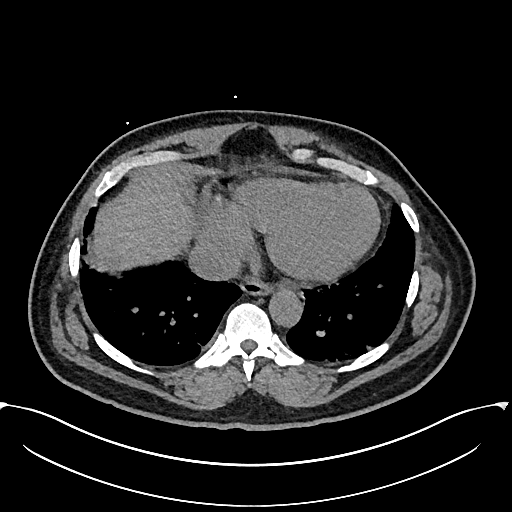
[im 71/185  lung]
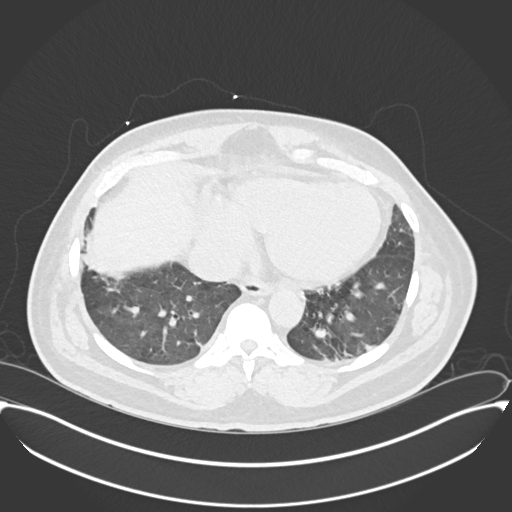
[im 85/185  lung]
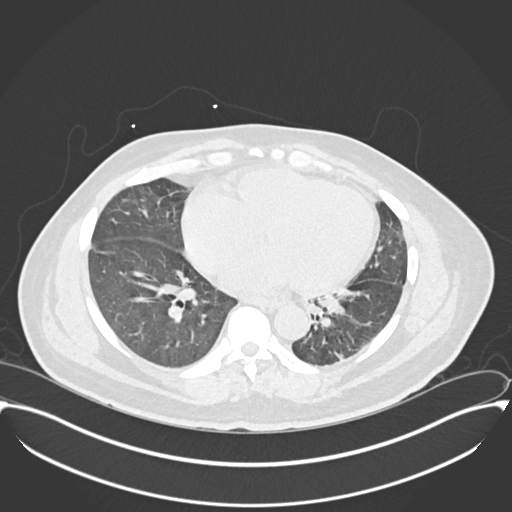
[im 100/185  lung]
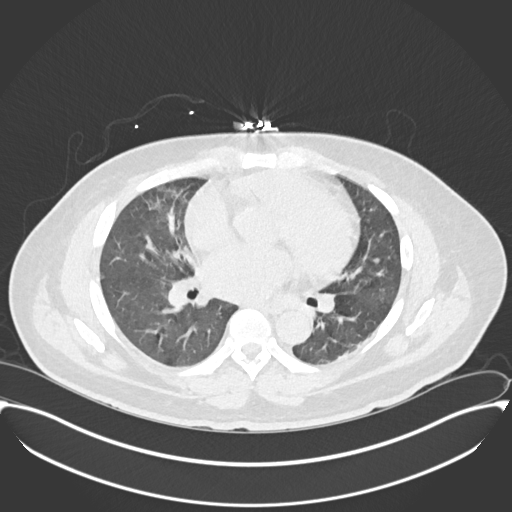
[im 114/185  lung]
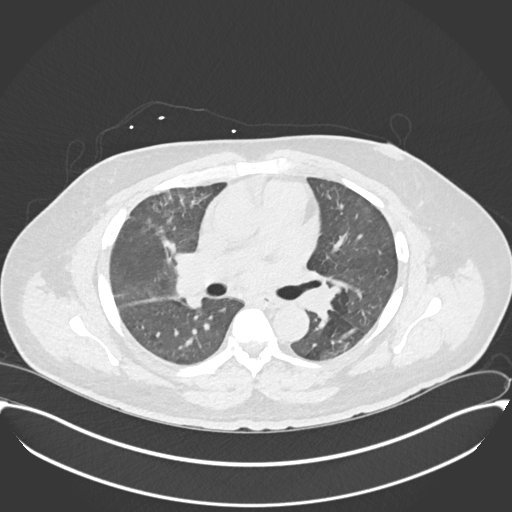
[im 128/185  mediastinal]
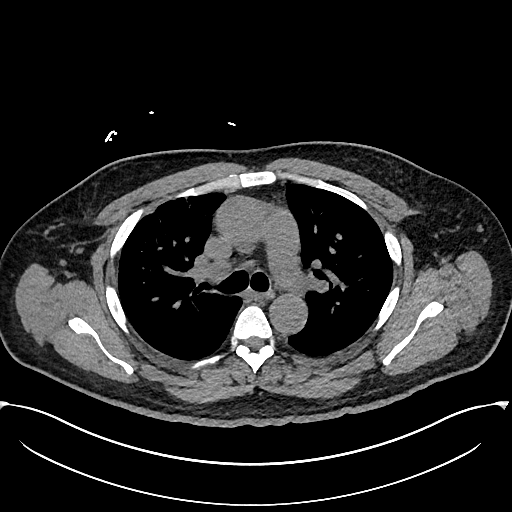
[im 128/185  lung]
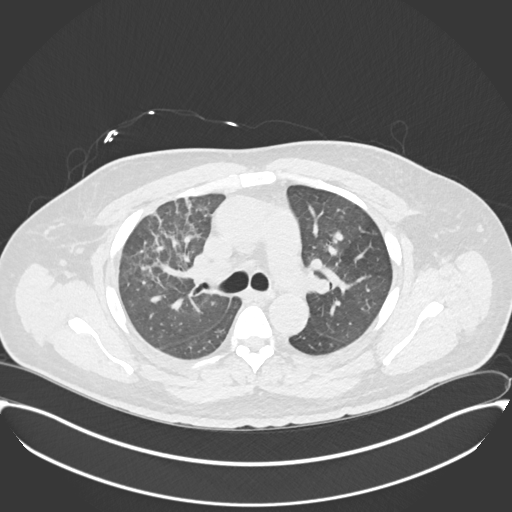
[im 142/185  lung]
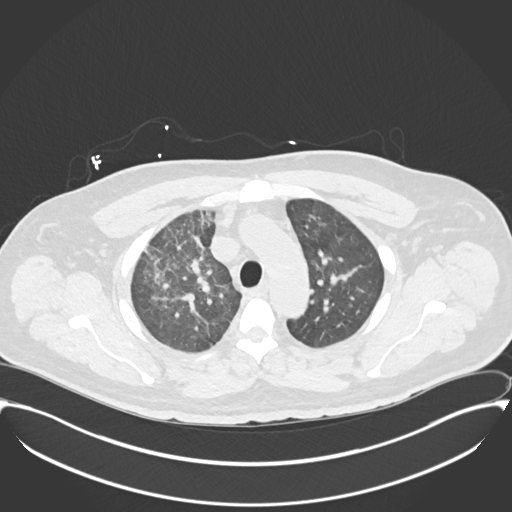
[im 156/185  lung]
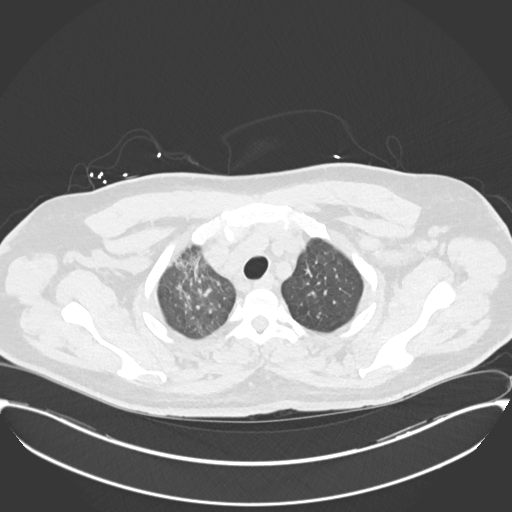
[im 170/185  lung]
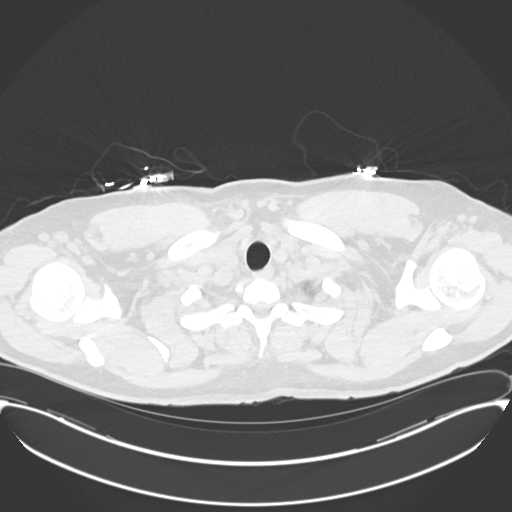

[Series 6: cor · coronal · 0.67mm/px · 3 of 111 slices shown]
[im 23/111  lung]
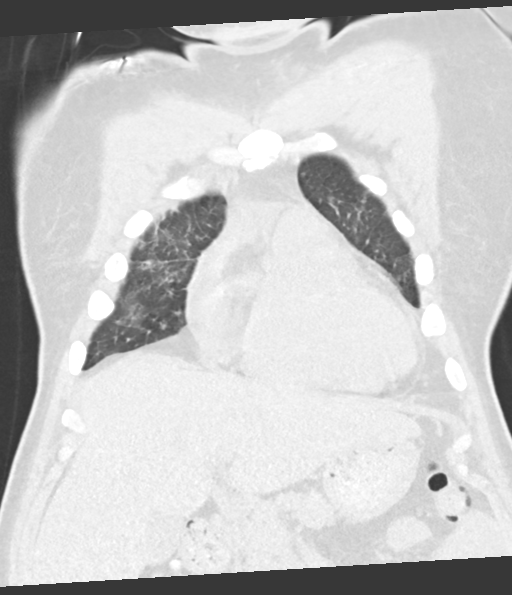
[im 45/111  lung]
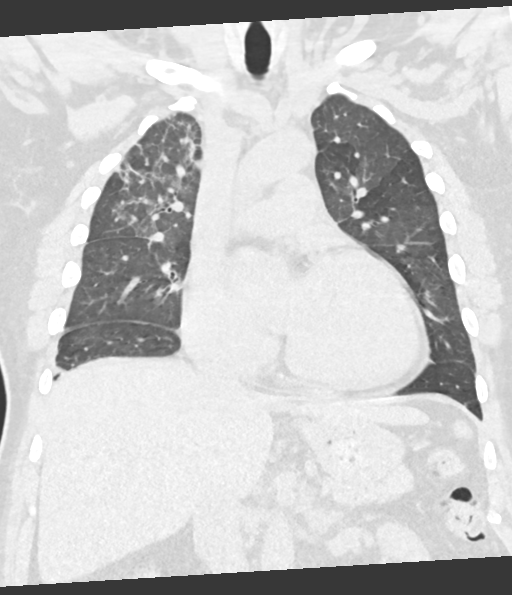
[im 67/111  lung]
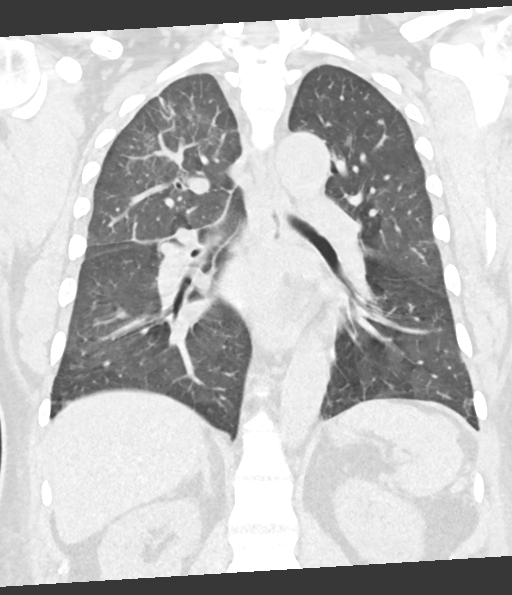

[15 of 36 positions shown; findings below may reference images not displayed]

FINDINGS: Cardiovascular: No significant vascular findings. Stable
cardiomegaly. No pericardial effusion. Coronary artery
atherosclerosis.

Mediastinum/Nodes: No enlarged mediastinal or axillary lymph nodes.
Thyroid gland, trachea, and esophagus demonstrate no significant
findings.

Lungs/Pleura: Significant interval improvement of bilateral airspace
disease with mild residual airspace disease in the right upper lobe,
right middle lobe and to lesser extent bilateral lower lobes.
Relative areas of ground-glass opacities which may be due to air
trapping. 3 mm subpleural right upper lobe pulmonary nodule.
Interval resolution of the right pleural effusion. Trace left
pleural effusion. No pneumothorax.

Upper Abdomen: No acute abnormality.

Musculoskeletal: No acute osseous abnormality. No aggressive osseous
lesion.
IMPRESSION: 1. Significant interval improvement bilateral airspace disease with
mild residual airspace disease in the right upper lobe, right middle
lobe and to lesser extent bilateral lower lobes.
2. Interval resolution of the right pleural effusion. Trace left
pleural effusion.

## 2022-07-19 ENCOUNTER — Emergency Department (HOSPITAL_COMMUNITY)
Admission: EM | Admit: 2022-07-19 | Discharge: 2022-07-20 | Discharge status: Against Medical Advice | Payer: Medicaid Other | Source: Home / Self Care

## 2022-07-19 DIAGNOSIS — R0981 Nasal congestion: Secondary | ICD-10-CM | POA: Insufficient documentation

## 2022-07-19 DIAGNOSIS — R059 Cough, unspecified: Secondary | ICD-10-CM | POA: Insufficient documentation

## 2022-07-19 DIAGNOSIS — Z5321 Procedure and treatment not carried out due to patient leaving prior to being seen by health care provider: Secondary | ICD-10-CM | POA: Insufficient documentation

## 2022-07-19 DIAGNOSIS — Z20822 Contact with and (suspected) exposure to covid-19: Secondary | ICD-10-CM | POA: Insufficient documentation

## 2022-07-20 ENCOUNTER — Encounter (HOSPITAL_COMMUNITY): Payer: Self-pay

## 2022-07-20 ENCOUNTER — Inpatient Hospital Stay (HOSPITAL_COMMUNITY)
Admission: EM | Admit: 2022-07-20 | Discharge: 2022-07-25 | DRG: 194 | Disposition: A | Discharge status: Home / Self Care | Payer: Medicaid Other | Attending: Internal Medicine | Admitting: Internal Medicine

## 2022-07-20 ENCOUNTER — Other Ambulatory Visit: Payer: Self-pay

## 2022-07-20 ENCOUNTER — Emergency Department (HOSPITAL_COMMUNITY): Payer: Medicaid Other

## 2022-07-20 DIAGNOSIS — Z79899 Other long term (current) drug therapy: Secondary | ICD-10-CM | POA: Diagnosis not present

## 2022-07-20 DIAGNOSIS — Z1152 Encounter for screening for COVID-19: Secondary | ICD-10-CM | POA: Diagnosis not present

## 2022-07-20 DIAGNOSIS — I160 Hypertensive urgency: Secondary | ICD-10-CM | POA: Diagnosis present

## 2022-07-20 DIAGNOSIS — N179 Acute kidney failure, unspecified: Secondary | ICD-10-CM | POA: Diagnosis present

## 2022-07-20 DIAGNOSIS — E119 Type 2 diabetes mellitus without complications: Secondary | ICD-10-CM

## 2022-07-20 DIAGNOSIS — J129 Viral pneumonia, unspecified: Principal | ICD-10-CM | POA: Diagnosis present

## 2022-07-20 DIAGNOSIS — Z6838 Body mass index (BMI) 38.0-38.9, adult: Secondary | ICD-10-CM

## 2022-07-20 DIAGNOSIS — E1122 Type 2 diabetes mellitus with diabetic chronic kidney disease: Secondary | ICD-10-CM | POA: Diagnosis present

## 2022-07-20 DIAGNOSIS — J189 Pneumonia, unspecified organism: Secondary | ICD-10-CM | POA: Diagnosis present

## 2022-07-20 DIAGNOSIS — Z833 Family history of diabetes mellitus: Secondary | ICD-10-CM

## 2022-07-20 DIAGNOSIS — R410 Disorientation, unspecified: Secondary | ICD-10-CM | POA: Diagnosis present

## 2022-07-20 DIAGNOSIS — Z7982 Long term (current) use of aspirin: Secondary | ICD-10-CM

## 2022-07-20 DIAGNOSIS — Z794 Long term (current) use of insulin: Secondary | ICD-10-CM | POA: Diagnosis not present

## 2022-07-20 DIAGNOSIS — Z87891 Personal history of nicotine dependence: Secondary | ICD-10-CM | POA: Diagnosis not present

## 2022-07-20 DIAGNOSIS — D638 Anemia in other chronic diseases classified elsewhere: Secondary | ICD-10-CM | POA: Diagnosis present

## 2022-07-20 DIAGNOSIS — R4182 Altered mental status, unspecified: Secondary | ICD-10-CM | POA: Diagnosis present

## 2022-07-20 DIAGNOSIS — I130 Hypertensive heart and chronic kidney disease with heart failure and stage 1 through stage 4 chronic kidney disease, or unspecified chronic kidney disease: Secondary | ICD-10-CM | POA: Diagnosis present

## 2022-07-20 DIAGNOSIS — N184 Chronic kidney disease, stage 4 (severe): Secondary | ICD-10-CM | POA: Diagnosis present

## 2022-07-20 DIAGNOSIS — Z8673 Personal history of transient ischemic attack (TIA), and cerebral infarction without residual deficits: Secondary | ICD-10-CM | POA: Diagnosis not present

## 2022-07-20 DIAGNOSIS — Z8249 Family history of ischemic heart disease and other diseases of the circulatory system: Secondary | ICD-10-CM

## 2022-07-20 LAB — CBC WITH DIFFERENTIAL/PLATELET
Abs Immature Granulocytes: 0.03 10*3/uL (ref 0.00–0.07)
Basophils Absolute: 0.1 10*3/uL (ref 0.0–0.1)
Basophils Relative: 1 %
Eosinophils Absolute: 0.1 10*3/uL (ref 0.0–0.5)
Eosinophils Relative: 2 %
HCT: 38.3 % — ABNORMAL LOW (ref 39.0–52.0)
Hemoglobin: 12 g/dL — ABNORMAL LOW (ref 13.0–17.0)
Immature Granulocytes: 0 %
Lymphocytes Relative: 27 %
Lymphs Abs: 2 10*3/uL (ref 0.7–4.0)
MCH: 26.4 pg (ref 26.0–34.0)
MCHC: 31.3 g/dL (ref 30.0–36.0)
MCV: 84.4 fL (ref 80.0–100.0)
Monocytes Absolute: 0.7 10*3/uL (ref 0.1–1.0)
Monocytes Relative: 9 %
Neutro Abs: 4.6 10*3/uL (ref 1.7–7.7)
Neutrophils Relative %: 61 %
Platelets: 342 10*3/uL (ref 150–400)
RBC: 4.54 MIL/uL (ref 4.22–5.81)
RDW: 15.7 % — ABNORMAL HIGH (ref 11.5–15.5)
WBC: 7.6 10*3/uL (ref 4.0–10.5)
nRBC: 0 % (ref 0.0–0.2)

## 2022-07-20 LAB — COMPREHENSIVE METABOLIC PANEL
ALT: 13 U/L (ref 0–44)
AST: 25 U/L (ref 15–41)
Albumin: 2.8 g/dL — ABNORMAL LOW (ref 3.5–5.0)
Alkaline Phosphatase: 52 U/L (ref 38–126)
Anion gap: 9 (ref 5–15)
BUN: 54 mg/dL — ABNORMAL HIGH (ref 6–20)
CO2: 26 mmol/L (ref 22–32)
Calcium: 8 mg/dL — ABNORMAL LOW (ref 8.9–10.3)
Chloride: 101 mmol/L (ref 98–111)
Creatinine, Ser: 4.58 mg/dL — ABNORMAL HIGH (ref 0.61–1.24)
GFR, Estimated: 15 mL/min — ABNORMAL LOW (ref >60)
Glucose, Bld: 136 mg/dL — ABNORMAL HIGH (ref 70–99)
Potassium: 4.1 mmol/L (ref 3.5–5.1)
Sodium: 136 mmol/L (ref 135–145)
Total Bilirubin: 0.6 mg/dL (ref 0.3–1.2)
Total Protein: 7.2 g/dL (ref 6.5–8.1)

## 2022-07-20 LAB — CBC
HCT: 37.1 % — ABNORMAL LOW (ref 39.0–52.0)
Hemoglobin: 11.5 g/dL — ABNORMAL LOW (ref 13.0–17.0)
MCH: 26.2 pg (ref 26.0–34.0)
MCHC: 31 g/dL (ref 30.0–36.0)
MCV: 84.5 fL (ref 80.0–100.0)
Platelets: 336 10*3/uL (ref 150–400)
RBC: 4.39 MIL/uL (ref 4.22–5.81)
RDW: 15.7 % — ABNORMAL HIGH (ref 11.5–15.5)
WBC: 8.9 10*3/uL (ref 4.0–10.5)
nRBC: 0 % (ref 0.0–0.2)

## 2022-07-20 LAB — TROPONIN I (HIGH SENSITIVITY)
Troponin I (High Sensitivity): 36 ng/L — ABNORMAL HIGH (ref <18)
Troponin I (High Sensitivity): 33 ng/L — ABNORMAL HIGH (ref <18)

## 2022-07-20 LAB — SARS CORONAVIRUS 2 BY RT PCR: SARS Coronavirus 2 by RT PCR: NEGATIVE

## 2022-07-20 LAB — GLUCOSE, CAPILLARY
Glucose-Capillary: 195 mg/dL — ABNORMAL HIGH (ref 70–99)
Glucose-Capillary: 134 mg/dL — ABNORMAL HIGH (ref 70–99)

## 2022-07-20 LAB — BRAIN NATRIURETIC PEPTIDE: B Natriuretic Peptide: 313.3 pg/mL — ABNORMAL HIGH (ref 0.0–100.0)

## 2022-07-20 LAB — MAGNESIUM: Magnesium: 2.4 mg/dL (ref 1.7–2.4)

## 2022-07-20 LAB — CREATININE, SERUM
Creatinine, Ser: 4.39 mg/dL — ABNORMAL HIGH (ref 0.61–1.24)
GFR, Estimated: 16 mL/min — ABNORMAL LOW (ref >60)

## 2022-07-20 MED ORDER — INSULIN ASPART 100 UNIT/ML IJ SOLN
0.0000 [IU] | Freq: Three times a day (TID) | INTRAMUSCULAR | Status: DC
  Administered 2022-07-20 – 2022-07-21 (×3): 3 [IU] via SUBCUTANEOUS
  Administered 2022-07-21: 2 [IU] via SUBCUTANEOUS
  Administered 2022-07-22: 5 [IU] via SUBCUTANEOUS
  Administered 2022-07-22: 2 [IU] via SUBCUTANEOUS
  Administered 2022-07-22 – 2022-07-23 (×3): 3 [IU] via SUBCUTANEOUS
  Administered 2022-07-24 (×2): 2 [IU] via SUBCUTANEOUS
  Administered 2022-07-24 – 2022-07-25 (×2): 3 [IU] via SUBCUTANEOUS

## 2022-07-20 MED ORDER — CARVEDILOL 12.5 MG PO TABS
25.0000 mg | ORAL_TABLET | Freq: Two times a day (BID) | ORAL | Status: DC
  Administered 2022-07-20 – 2022-07-21 (×3): 25 mg via ORAL
  Filled 2022-07-20 (×3): qty 2

## 2022-07-20 MED ORDER — SODIUM CHLORIDE 0.9 % IV SOLN
500.0000 mg | Freq: Once | INTRAVENOUS | Status: AC
  Administered 2022-07-20: 500 mg via INTRAVENOUS
  Filled 2022-07-20: qty 5

## 2022-07-20 MED ORDER — FLUTICASONE PROPIONATE 50 MCG/ACT NA SUSP
2.0000 | Freq: Two times a day (BID) | NASAL | Status: DC
  Administered 2022-07-22 – 2022-07-24 (×5): 2 via NASAL
  Filled 2022-07-20 (×2): qty 16

## 2022-07-20 MED ORDER — TORSEMIDE 20 MG PO TABS
80.0000 mg | ORAL_TABLET | Freq: Every day | ORAL | Status: DC
  Administered 2022-07-21 – 2022-07-23 (×3): 80 mg via ORAL
  Filled 2022-07-20 (×3): qty 4

## 2022-07-20 MED ORDER — HYDRALAZINE HCL 25 MG PO TABS
100.0000 mg | ORAL_TABLET | Freq: Once | ORAL | Status: AC
  Administered 2022-07-20: 100 mg via ORAL
  Filled 2022-07-20: qty 4

## 2022-07-20 MED ORDER — ACETAMINOPHEN 325 MG PO TABS: 650.0000 mg | ORAL_TABLET | Freq: Four times a day (QID) | ORAL | Status: DC | PRN

## 2022-07-20 MED ORDER — ASPIRIN 81 MG PO TBEC
81.0000 mg | DELAYED_RELEASE_TABLET | Freq: Every day | ORAL | Status: DC
  Administered 2022-07-20 – 2022-07-25 (×6): 81 mg via ORAL
  Filled 2022-07-20 (×6): qty 1

## 2022-07-20 MED ORDER — ALBUTEROL SULFATE (2.5 MG/3ML) 0.083% IN NEBU
2.5000 mg | INHALATION_SOLUTION | RESPIRATORY_TRACT | Status: DC | PRN
  Administered 2022-07-21 – 2022-07-22 (×2): 2.5 mg via RESPIRATORY_TRACT
  Filled 2022-07-20 (×2): qty 3

## 2022-07-20 MED ORDER — CARVEDILOL 12.5 MG PO TABS
25.0000 mg | ORAL_TABLET | Freq: Once | ORAL | Status: AC
  Administered 2022-07-20: 25 mg via ORAL
  Filled 2022-07-20: qty 2

## 2022-07-20 MED ORDER — HYDRALAZINE HCL 50 MG PO TABS
100.0000 mg | ORAL_TABLET | Freq: Three times a day (TID) | ORAL | Status: DC
  Administered 2022-07-20 – 2022-07-25 (×15): 100 mg via ORAL
  Filled 2022-07-20 (×15): qty 2

## 2022-07-20 MED ORDER — SODIUM CHLORIDE 0.9% FLUSH
3.0000 mL | Freq: Two times a day (BID) | INTRAVENOUS | Status: DC
  Administered 2022-07-20 – 2022-07-25 (×10): 3 mL via INTRAVENOUS

## 2022-07-20 MED ORDER — ACETAMINOPHEN 650 MG RE SUPP: 650.0000 mg | Freq: Four times a day (QID) | RECTAL | Status: DC | PRN

## 2022-07-20 MED ORDER — TORSEMIDE 20 MG PO TABS
40.0000 mg | ORAL_TABLET | Freq: Every day | ORAL | Status: DC
  Administered 2022-07-20: 40 mg via ORAL
  Filled 2022-07-20: qty 2

## 2022-07-20 MED ORDER — GUAIFENESIN-DM 100-10 MG/5ML PO SYRP
5.0000 mL | ORAL_SOLUTION | Freq: Four times a day (QID) | ORAL | Status: DC | PRN
  Administered 2022-07-20 – 2022-07-22 (×2): 5 mL via ORAL
  Filled 2022-07-20 (×2): qty 5

## 2022-07-20 MED ORDER — SODIUM CHLORIDE 0.9 % IV SOLN: 250.0000 mL | INTRAVENOUS | Status: DC | PRN

## 2022-07-20 MED ORDER — AMLODIPINE BESYLATE 5 MG PO TABS
10.0000 mg | ORAL_TABLET | Freq: Every day | ORAL | Status: DC
  Administered 2022-07-21 – 2022-07-25 (×5): 10 mg via ORAL
  Filled 2022-07-20 (×5): qty 2

## 2022-07-20 MED ORDER — SODIUM CHLORIDE 0.9 % IV SOLN
500.0000 mg | INTRAVENOUS | Status: DC
  Administered 2022-07-21 – 2022-07-22 (×2): 500 mg via INTRAVENOUS
  Filled 2022-07-20 (×3): qty 5

## 2022-07-20 MED ORDER — AMLODIPINE BESYLATE 5 MG PO TABS
10.0000 mg | ORAL_TABLET | Freq: Once | ORAL | Status: AC
  Administered 2022-07-20: 10 mg via ORAL
  Filled 2022-07-20: qty 2

## 2022-07-20 MED ORDER — AMLODIPINE BESYLATE 5 MG PO TABS: 10.0000 mg | ORAL_TABLET | Freq: Every day | ORAL | Status: DC

## 2022-07-20 MED ORDER — POTASSIUM CHLORIDE ER 20 MEQ PO TBCR: 1.0000 | EXTENDED_RELEASE_TABLET | Freq: Two times a day (BID) | ORAL | Status: DC

## 2022-07-20 MED ORDER — ISOSORBIDE MONONITRATE ER 60 MG PO TB24
120.0000 mg | ORAL_TABLET | Freq: Every day | ORAL | Status: DC
  Administered 2022-07-20 – 2022-07-25 (×6): 120 mg via ORAL
  Filled 2022-07-20 (×6): qty 2

## 2022-07-20 MED ORDER — HEPARIN SODIUM (PORCINE) 5000 UNIT/ML IJ SOLN
5000.0000 [IU] | Freq: Three times a day (TID) | INTRAMUSCULAR | Status: DC
  Administered 2022-07-20 – 2022-07-25 (×15): 5000 [IU] via SUBCUTANEOUS
  Filled 2022-07-20 (×15): qty 1

## 2022-07-20 MED ORDER — ONDANSETRON HCL 4 MG PO TABS: 4.0000 mg | ORAL_TABLET | Freq: Four times a day (QID) | ORAL | Status: DC | PRN

## 2022-07-20 MED ORDER — SODIUM CHLORIDE 0.9 % IV SOLN
2.0000 g | INTRAVENOUS | Status: AC
  Administered 2022-07-21 – 2022-07-24 (×4): 2 g via INTRAVENOUS
  Filled 2022-07-20 (×4): qty 20

## 2022-07-20 MED ORDER — SODIUM CHLORIDE 0.9% FLUSH: 3.0000 mL | INTRAVENOUS | Status: DC | PRN

## 2022-07-20 MED ORDER — CLONIDINE HCL 0.1 MG PO TABS
0.3000 mg | ORAL_TABLET | Freq: Once | ORAL | Status: AC
  Administered 2022-07-20: 0.3 mg via ORAL
  Filled 2022-07-20: qty 3

## 2022-07-20 MED ORDER — SODIUM CHLORIDE 0.9 % IV SOLN
1.0000 g | Freq: Once | INTRAVENOUS | Status: AC
  Administered 2022-07-20: 1 g via INTRAVENOUS
  Filled 2022-07-20: qty 10

## 2022-07-20 MED ORDER — POLYETHYLENE GLYCOL 3350 17 G PO PACK: 17.0000 g | PACK | Freq: Every day | ORAL | Status: DC | PRN

## 2022-07-20 MED ORDER — ONDANSETRON HCL 4 MG/2ML IJ SOLN: 4.0000 mg | Freq: Four times a day (QID) | INTRAMUSCULAR | Status: DC | PRN

## 2022-07-20 MED ORDER — POTASSIUM CHLORIDE CRYS ER 20 MEQ PO TBCR
20.0000 meq | EXTENDED_RELEASE_TABLET | Freq: Two times a day (BID) | ORAL | Status: DC
  Administered 2022-07-20 – 2022-07-21 (×2): 20 meq via ORAL
  Filled 2022-07-20 (×2): qty 1

## 2022-07-20 NOTE — ED Triage Notes (Addendum)
Pt arrived POV for cough and congestion for 3 days, as well as nasal congestion, pt reports thinks he has fluid built up in his lungs, denies SOB or CP. Report cough is productive with clear sputum. Reports son has also had a recent cough. Denies fever or chills, or recent weight gain. A&O x4, NAD noted.

## 2022-07-20 NOTE — H&P (Signed)
History and Physical    Micheal Levy  KGY:[id number]  DOB: [birth year]  DOA: 07/20/2022 PCP: Barbette Merino, NP   Patient coming from: home  Chief Complaint: cough  HPI: Micheal Levy is a 42 y.o. male with medical history of this is a 42 year old male with chronic kidney disease, diabetes mellitus, hypertension, history of CVA who presented to the ED on 4/14 with cough and wanted to be tested for COVID.    He fell asleep in the lobby and when he awoke he was found to be confused. He has had a cough for about 1 wk and has sputum. Has had a dry throat. No hemoptysis. He has some chest pain when he coughs. He had 3 loose stools yesterday evening but none since and none prior. In regard to his renal failure and edema, he does not feel more swollen than usual. He has been taking his meds appropriately.   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  Past Medical History:  Diagnosis Date   Chronic kidney disease    Diabetes mellitus without complication    Elevated serum cholesterol 04/2019   Elevated serum creatinine 04/2019   Hypertension    Hypertensive urgency 04/2019   Seizures 03/2017   x 2    Stroke 03/2017   Substance abuse    Vitamin D deficiency 04/2019    Past Surgical History:  Procedure Laterality Date   RIGHT HEART CATH N/A 05/31/2021   Procedure: RIGHT HEART CATH;  Surgeon: Corky Crafts, MD;  Location: [facility] INVASIVE CV LAB;  Service: Cardiovascular;  Laterality:  N/A;    Social History:   reports that he quit smoking about 23 years ago. His smoking use included cigarettes. He has never used smokeless tobacco. He reports current alcohol use. He reports current drug use. Drugs: Cocaine and Marijuana.  No Known Allergies  Family History  Problem Relation Age of Onset   Diabetes Father    Heart attack Father    Diabetes Sister    Hypertension Brother      Prior to Admission medications   Medication Sig Start Date End Date Taking? Authorizing Provider  albuterol (VENTOLIN HFA) 108 (90 Base) MCG/ACT inhaler Inhale 2 puffs into the lungs every 6 (six) hours as needed for wheezing or shortness of breath. 05/30/22   Marguerita Merles Latif, DO  amLODipine (NORVASC) 10 MG tablet Take 10 mg by mouth daily.    [provider]  aspirin EC 81 MG tablet Take 1 tablet (81 mg total) by mouth daily. 05/30/22   Marguerita Merles Latif, DO  Blood Glucose Monitoring Suppl (TRUE METRIX AIR GLUCOSE METER) DEVI 1 each by Does not apply route 4 (four) times daily -  before meals and at bedtime. 04/19/19   [name], FNP  carvedilol (COREG) 25 MG tablet Take 25 mg by mouth 2 (two) times daily with a meal.    [provider]  cloNIDine (CATAPRES) 0.3 MG tablet Take 1 tablet (0.3 mg total) by mouth 3 (three) times daily.  06/08/21   [name], MD  fluticasone (FLONASE) 50 MCG/ACT nasal spray Place 2 sprays into both nostrils 2 (two) times daily. Patient not taking: Reported on 06/02/2022 05/30/22   Marguerita Merles Latif, DO  glucose blood (TRUE METRIX BLOOD GLUCOSE TEST) test strip Use as instructed Patient taking differently: 1 each by Other route as directed. 04/19/19   [name], FNP  hydrALAZINE (APRESOLINE) 100 MG tablet Take 1 tablet (100 mg total) by mouth every 8 (eight) hours. 06/24/21   [name], Kardie, DO  insulin glargine (LANTUS SOLOSTAR) 100 UNIT/ML Solostar Pen Inject 5 Units into the skin daily at 10 pm. Patient not taking: Reported on  06/02/2022 06/08/21 06/08/22  [name], MD  Insulin Pen Needle (PEN NEEDLES) 31G X 6 MM MISC 1 each by Does not apply route at bedtime. 10/05/17   Massie Maroon, FNP  INSULIN SYRINGE .5CC/29G (B-D INS SYR ULTRAFINE .5CC/29G) 29G X 1/2" 0.5 ML MISC 1 each by Does not apply route at bedtime. 04/02/17   Massie Maroon, FNP  isosorbide mononitrate (IMDUR) 120 MG 24 hr tablet Take 1 tablet (120 mg total) by mouth daily. 06/09/21   [name], MD  Lancets MISC 1 each by Does not apply route 4 (four) times daily -  before meals and at bedtime. 04/19/19   [name], FNP  Multiple Vitamins-Minerals (MULTIVITAMIN ADULTS) TABS Take 1 tablet by mouth daily.    [provider]  Potassium Chloride ER 20 MEQ TBCR Take 1 tablet by mouth 2 (two) times daily. 05/30/22   [provider]  senna-docusate (SENOKOT-S) 8.6-50 MG tablet Take 1 tablet by mouth at bedtime as needed for mild constipation. Patient not taking: Reported on 05/22/2022 12/25/21   Kathlen Mody, MD  torsemide 40 MG TABS Take 80 mg by mouth in the morning. 05/31/22   Marguerita Merles Latif, DO  torsemide 40 MG TABS Take 80 mg by mouth daily as needed (take extra  pill w/ supper if wt gain > 5 lbs; continue to take until excess weight is gone). 05/30/22   Marguerita Merles [place], DO    Physical Exam: Wt Readings from Last 3 Encounters:  07/20/22 117.5 kg  07/20/22 117.5 kg  05/30/22 107.7 kg   Vitals:   07/20/22 1400 07/20/22 1405 07/20/22 1430 07/20/22 1438  BP: (!) 162/97  (!) 145/89   Pulse:  72 67   Resp:  (!) 23 (!) 21   Temp:    98.9 F (37.2 C)  TempSrc:    Oral  SpO2:  94% 94%   Weight:      Height:          Constitutional:  Calm & comfortable Eyes: PERRLA, lids and conjunctivae normal ENT:  Mucous membranes are moist.  Pharynx clear of exudate   Normal dentition.  Respiratory:  B/l crackles, RR in 20s, coughing Cardiovascular:  S1 & S2 heard, regular rate and rhythm No  Murmurs Abdomen:  Non distended No tenderness, No masses Bowel sounds normal Extremities:  No clubbing / cyanosis No pedal edema  Skin:  No rashes, lesions or ulcers Neurologic:  AAO x 3 CN 2-12 grossly intact Sensation intact Strength 5/5 in all 4 extremities Psychiatric:  Normal Mood and affect    Labs on Admission: I have personally reviewed following labs and imaging studies  CBC: Recent Labs  Lab 07/20/22 0918  WBC 7.6  NEUTROABS 4.6  HGB 12.0*  HCT 38.3*  MCV 84.4  PLT  342   Basic Metabolic Panel: Recent Labs  Lab 07/20/22 0918  NA 136  K 4.1  CL 101  CO2 26  GLUCOSE 136*  BUN 54*  CREATININE 4.58*  CALCIUM 8.0*  MG 2.4   GFR: Estimated Creatinine Clearance: 26.1 mL/min (A) (by C-G formula based on SCr of 4.58 mg/dL (H)). Liver Function Tests: Recent Labs  Lab 07/20/22 0918  AST 25  ALT 13  ALKPHOS 52  BILITOT 0.6  PROT 7.2  ALBUMIN 2.8*   No results for input(s): "LIPASE", "AMYLASE" in the last 168 hours. No results for input(s): "AMMONIA" in the last 168 hours. Coagulation Profile: No results for input(s): "INR", "PROTIME" in the last 168 hours. Cardiac Enzymes: No results for input(s): "CKTOTAL", "CKMB", "CKMBINDEX", "TROPONINI" in the last 168 hours. BNP (last 3 results) No results for input(s): "PROBNP" in the last 8760 hours. HbA1C: No results for input(s): "HGBA1C" in the last 72 hours. CBG: No results for input(s): "GLUCAP" in the last 168 hours. Lipid Profile: No results for input(s): "CHOL", "HDL", "LDLCALC", "TRIG", "CHOLHDL", "LDLDIRECT" in the last 72 hours. Thyroid Function Tests: No results for input(s): "TSH", "T4TOTAL", "FREET4", "T3FREE", "THYROIDAB" in the last 72 hours. Anemia Panel: No results for input(s): "VITAMINB12", "FOLATE", "FERRITIN", "TIBC", "IRON", "RETICCTPCT" in the last 72 hours. Urine analysis:    Component Value Date/Time   COLORURINE YELLOW 05/22/2022 2217   APPEARANCEUR CLEAR 05/22/2022 2217    LABSPEC 1.008 05/22/2022 2217   PHURINE 8.0 05/22/2022 2217   GLUCOSEU 50 (A) 05/22/2022 2217   HGBUR NEGATIVE 05/22/2022 2217   BILIRUBINUR NEGATIVE 05/22/2022 2217   BILIRUBINUR small 11/11/2019 1206   KETONESUR NEGATIVE 05/22/2022 2217   PROTEINUR >=300 (A) 05/22/2022 2217   UROBILINOGEN 0.2 11/11/2019 1206   UROBILINOGEN 0.2 06/04/2017 1058   NITRITE NEGATIVE 05/22/2022 2217   LEUKOCYTESUR NEGATIVE 05/22/2022 2217   Sepsis Labs: @LABRCNTIP (procalcitonin:4,lacticidven:4) ) Recent Results (from the past 240 hour(s))  SARS Coronavirus 2 by RT PCR (hospital order, performed in [facility] hospital lab) *cepheid single result test* Anterior Nasal Swab     Status: None   Collection Time: 07/20/22 12:35 AM   Specimen: Anterior Nasal Swab  Result Value Ref Range Status   SARS Coronavirus 2 by RT PCR NEGATIVE NEGATIVE Final    Comment: (NOTE) SARS-CoV-2 target nucleic acids are NOT DETECTED.  The SARS-CoV-2 RNA is generally detectable in upper and lower respiratory specimens during the acute phase of infection. The lowest concentration of SARS-CoV-2 viral copies this assay can detect is 250 copies / mL. A negative result does not preclude SARS-CoV-2 infection and should not be used as the sole basis for treatment or other patient management decisions.  A negative result may occur with improper specimen collection / handling, submission of specimen other than nasopharyngeal swab, presence of viral mutation(s) within the areas targeted by this assay, and inadequate number of viral copies (<250 copies / mL). A negative result must be combined with clinical observations, patient history, and epidemiological information.  Fact Sheet for Patients:   RoadLapTop.co.za  Fact Sheet for Healthcare Providers: http://kim-Ask.com/  This test is not yet approved or  cleared by the [place] FDA and has been authorized for detection and/or  diagnosis of SARS-CoV-2 by FDA under an Emergency Use Authorization (EUA).  This EUA will remain in effect (meaning this test can be used) for the duration of the COVID-19 declaration under Section 564(b)(1) of the Act, 21 U.S.C. section 360bbb-3(b)(1), unless the authorization is terminated or revoked  sooner.  Performed at [facility], [address] [place], [place] [address]      Radiological Exams on Admission: CT Head Wo Contrast  Result Date: 07/20/2022 CLINICAL DATA:  Hypertension EXAM: CT HEAD WITHOUT CONTRAST TECHNIQUE: Contiguous axial images were obtained from the base of the skull through the vertex without intravenous contrast. RADIATION DOSE REDUCTION: This exam was performed according to the departmental dose-optimization program which includes automated exposure control, adjustment of the mA and/or kV according to patient size and/or use of iterative reconstruction technique. COMPARISON:  09/04/2019 FINDINGS: Brain: No evidence of acute infarction, hemorrhage, hydrocephalus, or extra-axial collection. Stable arachnoid cyst in the right middle cranial fossa. No new mass lesion. Scattered low-density changes within the periventricular and subcortical white matter most compatible with chronic microvascular ischemic change. Mild diffuse cerebral volume loss. Vascular: No hyperdense vessel or unexpected calcification. Skull: Normal. Negative for fracture or focal lesion. Sinuses/Orbits: No acute finding. Other: None. IMPRESSION: 1. No acute intracranial abnormality. 2. Stable chronic microvascular ischemic change and cerebral volume loss. 3. Stable arachnoid cyst in the right middle cranial fossa. Electronically Signed   By: Duanne Guess D.O.   On: 07/20/2022 10:10   DG Chest 2 View  Result Date: 07/20/2022 CLINICAL DATA:  Cough for 1 week. EXAM: CHEST - 2 VIEW COMPARISON:  Chest x-rays dated 07/20/2022 in 05/28/2022. FINDINGS: Continued bilateral diffuse  interstitial and alveolar opacities, RIGHT slightly greater than LEFT. Stable cardiomegaly. No pleural effusion or pneumothorax is seen. Osseous structures about the chest are unremarkable. IMPRESSION: Continued bilateral diffuse interstitial and alveolar opacities, RIGHT slightly greater than LEFT, compatible with multifocal pneumonia and/or pulmonary edema. Electronically Signed   By: Bary Richard M.D.   On: 07/20/2022 09:32   DG Chest 2 View  Result Date: 07/20/2022 CLINICAL DATA:  Cough, congestion EXAM: CHEST - 2 VIEW COMPARISON:  05/28/2022 FINDINGS: Multifocal patchy airspace opacities, right lung predominant, favoring multifocal infection over asymmetric interstitial edema. No definite pleural effusions. No pneumothorax. Cardiomegaly. Mild degenerative changes of the visualized thoracolumbar spine. IMPRESSION: Multifocal patchy airspace opacities, right lung predominant, favoring multifocal infection over asymmetric interstitial edema. Electronically Signed   By: Charline Bills M.D.   On: 07/20/2022 01:12    EKG: Independently reviewed. NSR  Assessment/Plan Principal Problem:   Bilateral pneumonia - less likely pulmonary edema - check U strep and legionella antigens - Ceftriaxone and Azithromycin - declines cough suppressant and expectorant - no wheezing- PRN nebs ordered in case- no h/o COPD   Active Problems:   Altered mental status - while in triage and resolved on its own    Hypertensive urgency - improved after home meds given in ED - cont home meds  Mildly elevated troponin - no chest pain consistent with ACS - Possibly due to HTN vs CKD     CKD (chronic kidney disease) stage 4, GFR 15-29 ml/min - Cr as baseline at ~ 4.5, GFR ~ 15 - cont Torsemide and K replacement    Insulin-requiring or dependent type II diabetes mellitus Hold lantus which is 5 U QHS - not on Novolog - SSI ordered  Sesonal allergies - cont Flonase  AOCD, normocytic anemia - Hgb ~  12   DVT prophylaxis: heparin injection 5,000 Units Start: 07/20/22 1630 SCDs Start: 07/20/22 1543  Code Status: Full code - discussed with patient  Consults called: none  Admission status:  Level of care: Telemetry  Calvert Cantor MD [facility]    07/20/2022, 3:40 PM

## 2022-07-20 NOTE — ED Notes (Signed)
Pt's oxygen saturation has maintained in the low 90s. When medic would leave room sensor would fall slightly off pt's hand. Medic switched oxygen sensor and pt's oxygen saturation has maintained in the 90s.

## 2022-07-20 NOTE — ED Provider Notes (Signed)
[place] EMERGENCY DEPARTMENT AT [facility] Provider Note   CSN: [id number] Arrival date & time: 07/20/22  4098     History  No chief complaint on file.   Micheal Levy is a 42 y.o. male.  Patient with history of CKD, CHF, CVA, hypertension and diabetes presents today with complaints of cough. Patient initially presented with complaints of same x 2 days which is productive of clear sputum. He denies chest pain or shortness of breath. Patient initially checked in around midnight last night and apparently slept in the lobby last night and was overlooked due to extended wait times until the lobby was cleared out this morning. Patient was then awoken by staff and was reportedly confused and did not know where he was or why he was here. Upon my assessment, patient is alert and oriented but does seem a bit confused about events leading up to his ER visit today. He does state that he has not had any of his medications this morning and is unsure if he took them last night either. He denies headache, vision changes, chest pain, shortness of breath, nausea, vomiting, diarrhea, or abdominal pain.  The history is provided by the patient. No language interpreter was used.       Home Medications Prior to Admission medications   Medication Sig Start Date End Date Taking? Authorizing Provider  albuterol (VENTOLIN HFA) 108 (90 Base) MCG/ACT inhaler Inhale 2 puffs into the lungs every 6 (six) hours as needed for wheezing or shortness of breath. 05/30/22   Marguerita Merles Latif, DO  amLODipine (NORVASC) 10 MG tablet Take 10 mg by mouth daily.    [provider]  aspirin EC 81 MG tablet Take 1 tablet (81 mg total) by mouth daily. 05/30/22   Marguerita Merles Latif, DO  Blood Glucose Monitoring Suppl (TRUE METRIX AIR GLUCOSE METER) DEVI 1 each by Does not apply route 4 (four) times daily -  before meals and at bedtime. 04/19/19   [name], FNP  carvedilol (COREG) 25 MG tablet Take 25  mg by mouth 2 (two) times daily with a meal.    [provider]  cloNIDine (CATAPRES) 0.3 MG tablet Take 1 tablet (0.3 mg total) by mouth 3 (three) times daily. 06/08/21   [name], MD  fluticasone (FLONASE) 50 MCG/ACT nasal spray Place 2 sprays into both nostrils 2 (two) times daily. Patient not taking: Reported on 06/02/2022 05/30/22   Marguerita Merles Latif, DO  glucose blood (TRUE METRIX BLOOD GLUCOSE TEST) test strip Use as instructed Patient taking differently: 1 each by Other route as directed. 04/19/19   [name], FNP  hydrALAZINE (APRESOLINE) 100 MG tablet Take 1 tablet (100 mg total) by mouth every 8 (eight) hours. 06/24/21   [name], Kardie, DO  insulin glargine (LANTUS SOLOSTAR) 100 UNIT/ML Solostar Pen Inject 5 Units into the skin daily at 10 pm. Patient not taking: Reported on 06/02/2022 06/08/21 06/08/22  [name], MD  Insulin Pen Needle (PEN NEEDLES) 31G X 6 MM MISC 1 each by Does not apply route at bedtime. 10/05/17   Massie Maroon, FNP  INSULIN SYRINGE .5CC/29G (B-D INS SYR ULTRAFINE .5CC/29G) 29G X 1/2" 0.5 ML MISC 1 each by Does not apply route at bedtime. 04/02/17   Massie Maroon, FNP  isosorbide mononitrate (IMDUR) 120 MG 24 hr tablet Take 1 tablet (120 mg total) by mouth daily. 06/09/21   [name], MD  Lancets MISC 1 each  by Does not apply route 4 (four) times daily -  before meals and at bedtime. 04/19/19   [name], FNP  Multiple Vitamins-Minerals (MULTIVITAMIN ADULTS) TABS Take 1 tablet by mouth daily.    [provider]  Potassium Chloride ER 20 MEQ TBCR Take 1 tablet by mouth 2 (two) times daily. 05/30/22   [provider]  senna-docusate (SENOKOT-S) 8.6-50 MG tablet Take 1 tablet by mouth at bedtime as needed for mild constipation. Patient not taking: Reported on 05/22/2022 12/25/21   Kathlen Mody, MD  torsemide 40 MG TABS Take 80 mg by mouth in the morning. 05/31/22   Marguerita Merles Latif, DO  torsemide 40 MG  TABS Take 80 mg by mouth daily as needed (take extra 80mg  pill w/ supper if wt gain > 5 lbs; continue to take until excess weight is gone). 05/30/22   Merlene Laughter, DO      Allergies    Patient has no known allergies.    Review of Systems   Review of Systems  Respiratory:  Positive for cough.   All other systems reviewed and are negative.   Physical Exam Updated Vital Signs BP (!) 177/105 (BP Location: Left Arm)   Pulse (!) 56   Temp 98.2 F (36.8 C) (Oral)   Resp (!) 22   Ht 5' 8.5" (1.74 m)   Wt 117.5 kg   SpO2 92%   BMI 38.81 kg/m  Physical Exam Vitals and nursing note reviewed.  Constitutional:      General: He is not in acute distress.    Appearance: Normal appearance. He is normal weight. He is not ill-appearing, toxic-appearing or diaphoretic.  HENT:     Head: Normocephalic and atraumatic.  Cardiovascular:     Rate and Rhythm: Normal rate.  Pulmonary:     Effort: Pulmonary effort is normal. No respiratory distress.  Abdominal:     General: Abdomen is flat.     Palpations: Abdomen is soft.     Tenderness: There is no abdominal tenderness.  Musculoskeletal:        General: Normal range of motion.     Cervical back: Normal range of motion.  Skin:    General: Skin is warm and dry.  Neurological:     General: No focal deficit present.     Mental Status: He is alert.  Psychiatric:        Mood and Affect: Mood normal.        Behavior: Behavior normal.     ED Results / Procedures / Treatments   Labs (all labs ordered are listed, but only abnormal results are displayed) Labs Reviewed  CBC WITH DIFFERENTIAL/PLATELET - Abnormal; Notable for the following components:      Result Value   Hemoglobin 12.0 (*)    HCT 38.3 (*)    RDW 15.7 (*)    All other components within normal limits  COMPREHENSIVE METABOLIC PANEL - Abnormal; Notable for the following components:   Glucose, Bld 136 (*)    BUN 54 (*)    Creatinine, Ser 4.58 (*)    Calcium 8.0 (*)     Albumin 2.8 (*)    GFR, Estimated 15 (*)    All other components within normal limits  BRAIN NATRIURETIC PEPTIDE - Abnormal; Notable for the following components:   B Natriuretic Peptide 313.3 (*)    All other components within normal limits  TROPONIN I (HIGH SENSITIVITY) - Abnormal; Notable for the following components:   Troponin I (  High Sensitivity) 36 (*)    All other components within normal limits  TROPONIN I (HIGH SENSITIVITY) - Abnormal; Notable for the following components:   Troponin I (High Sensitivity) 33 (*)    All other components within normal limits  MAGNESIUM  PATHOLOGIST SMEAR REVIEW    EKG EKG Interpretation  Date/Time:  Sunday July 20 2022 09:17:18 EDT Ventricular Rate:  61 PR Interval:  176 QRS Duration: 101 QT Interval:  484 QTC Calculation: 488 R Axis:   28 Text Interpretation: Sinus rhythm Abnormal T, consider ischemia, lateral leads No significant change since last tracing Confirmed by Vivien Rossetti ([id number]) on 07/20/2022 9:23:47 AM  Radiology CT Head Wo Contrast  Result Date: 07/20/2022 CLINICAL DATA:  Hypertension EXAM: CT HEAD WITHOUT CONTRAST TECHNIQUE: Contiguous axial images were obtained from the base of the skull through the vertex without intravenous contrast. RADIATION DOSE REDUCTION: This exam was performed according to the departmental dose-optimization program which includes automated exposure control, adjustment of the mA and/or kV according to patient size and/or use of iterative reconstruction technique. COMPARISON:  09/04/2019 FINDINGS: Brain: No evidence of acute infarction, hemorrhage, hydrocephalus, or extra-axial collection. Stable arachnoid cyst in the right middle cranial fossa. No new mass lesion. Scattered low-density changes within the periventricular and subcortical white matter most compatible with chronic microvascular ischemic change. Mild diffuse cerebral volume loss. Vascular: No hyperdense vessel or unexpected calcification.  Skull: Normal. Negative for fracture or focal lesion. Sinuses/Orbits: No acute finding. Other: None. IMPRESSION: 1. No acute intracranial abnormality. 2. Stable chronic microvascular ischemic change and cerebral volume loss. 3. Stable arachnoid cyst in the right middle cranial fossa. Electronically Signed   By: Duanne Guess D.O.   On: 07/20/2022 10:10   DG Chest 2 View  Result Date: 07/20/2022 CLINICAL DATA:  Cough for 1 week. EXAM: CHEST - 2 VIEW COMPARISON:  Chest x-rays dated 07/20/2022 in 05/28/2022. FINDINGS: Continued bilateral diffuse interstitial and alveolar opacities, RIGHT slightly greater than LEFT. Stable cardiomegaly. No pleural effusion or pneumothorax is seen. Osseous structures about the chest are unremarkable. IMPRESSION: Continued bilateral diffuse interstitial and alveolar opacities, RIGHT slightly greater than LEFT, compatible with multifocal pneumonia and/or pulmonary edema. Electronically Signed   By: Bary Richard M.D.   On: 07/20/2022 09:32   DG Chest 2 View  Result Date: 07/20/2022 CLINICAL DATA:  Cough, congestion EXAM: CHEST - 2 VIEW COMPARISON:  05/28/2022 FINDINGS: Multifocal patchy airspace opacities, right lung predominant, favoring multifocal infection over asymmetric interstitial edema. No definite pleural effusions. No pneumothorax. Cardiomegaly. Mild degenerative changes of the visualized thoracolumbar spine. IMPRESSION: Multifocal patchy airspace opacities, right lung predominant, favoring multifocal infection over asymmetric interstitial edema. Electronically Signed   By: Charline Bills M.D.   On: 07/20/2022 01:12    Procedures .Critical Care  Performed by: Silva Bandy, PA-C Authorized by: Silva Bandy, PA-C   Critical care provider statement:    Critical care time (minutes):  30   Critical care start time:  07/20/2022 12:00 PM   Critical care end time:  07/20/2022 12:30 PM   Critical care was necessary to treat or prevent imminent or  life-threatening deterioration of the following conditions: hypertensive urgency, CAP, pulmonary edema.   Critical care was time spent personally by me on the following activities:  Development of treatment plan with patient or surrogate, discussions with primary provider, evaluation of patient's response to treatment, examination of patient, obtaining history from patient or surrogate, ordering and review of laboratory studies, ordering and review of radiographic  studies, pulse oximetry, re-evaluation of patient's condition and review of old charts   Care discussed with: admitting provider       Medications Ordered in ED Medications  amLODipine (NORVASC) tablet 10 mg (10 mg Oral Given 07/20/22 0932)  carvedilol (COREG) tablet 25 mg (25 mg Oral Given 07/20/22 0932)  cloNIDine (CATAPRES) tablet 0.3 mg (0.3 mg Oral Given 07/20/22 0931)  hydrALAZINE (APRESOLINE) tablet 100 mg (100 mg Oral Given by Other 07/20/22 1610)    ED Course/ Medical Decision Making/ A&P                             Medical Decision Making Amount and/or Complexity of Data Reviewed Labs: ordered. Radiology: ordered.  Risk Prescription drug management.   This patient is a 42 y.o. male who presents to the ED for concern of cough, confusion this involves an extensive number of treatment options, and is a complaint that carries with it a high risk of complications and morbidity. The emergent differential diagnosis prior to evaluation includes, but is not limited to,  sepsis, ACS, CVA, respiratory distress, Upper respiratory infection, lower respiratory infection, asthma, reflux, CHF, lung cancer, interstitial lung disease, psychiatric causes  This is not an exhaustive differential.   Past Medical History / Co-morbidities / Social History: history of CKD, CHF, CVA, hypertension and diabetes  Additional history: Chart reviewed. Pertinent results include: patient with recent ICU admission for hypertensive emergency. Echo at  that time showed EF 65%-70% with severe LVH  Physical Exam: Physical exam performed. The pertinent findings include: alert and oriented and neurologically intact without focal deficits  Lab Tests: I ordered, and personally interpreted labs.  The pertinent results include:  COVID negative, hgb 12, kidney function at baseline. Troponin 36 --> 33. BNP 313   Imaging Studies: I ordered imaging studies including CT head, CXR. I independently visualized and interpreted imaging which showed   CT head:   1. No acute intracranial abnormality. 2. Stable chronic microvascular ischemic change and cerebral volume loss. 3. Stable arachnoid cyst in the right middle cranial fossa.  CXR:   Continued bilateral diffuse interstitial and alveolar opacities, RIGHT slightly greater than LEFT, compatible with multifocal pneumonia and/or pulmonary edema.  I agree with the radiologist interpretation.   Cardiac Monitoring:  The patient was maintained on a cardiac monitor.  My attending physician Dr. Elpidio Anis viewed and interpreted the cardiac monitored which showed an underlying rhythm of: no STEMI, sinus rhythm. I agree with this interpretation.   Medications: I ordered medication including norvasc, carvedilol, clonidine, hydralazine, torsemide, rocephin, azithromycin  for hypertension, CAP, pulmonary edema. Reevaluation of the patient after these medicines showed that the patient improved. I have reviewed the patients home medicines and have made adjustments as needed.    Disposition:  Patient presents today initially with complaints of cough. Unfortunately, patient slept in the lobby all night and this morning when staff went to arouse him he was reportedly confused and kept telling staff that he was at the barber shop for a haircut. He was originally unable to tell me what had brought him to the hospital or if he had taken his medications recently. Therefore CT imaging ordered and labs initiated to  evaluate for AMS. Soon after this initial evaluation, patient was reassessed and his mental status had returned to baseline. Cxr shows concerns for multifocal pneumonia vs pulmonary edema. He does appear a bit fluid overloaded with 1+ pitting edema in bilateral lower extremities,  however BNP is only 300. O2 saturation in the low 90s on room air, is able to ambulate without desatting. However, patient continues to be hypertensive. Given his comorbid conditions, feel patient would benefit from admission for blood pressure control, IV antibiotics and diuresis. Patient is understanding and in agreement.  Discussed patient with hospitalist who agrees to admit  Findings and plan of care discussed with supervising physician Dr. Elpidio Anis who is in agreement.    Final Clinical Impression(s) / ED Diagnoses Final diagnoses:  Hypertensive urgency  Multifocal pneumonia  Disorientation    Rx / DC Orders ED Discharge Orders     None         Vear Clock 07/20/22 1352    Mardene Sayer, MD 07/20/22 [phone]

## 2022-07-20 NOTE — ED Triage Notes (Signed)
Pt slept overnight in the lobby and checked in when woken up. When asked why pt is here, he states "I don't know". Asked pt if he is in pain, he replies "no". No further information able to be obtained from pt. Pt ambulatory to room. NAD.

## 2022-07-20 NOTE — ED Notes (Signed)
Oxygen saturation in low 90s before ambulating   Pt ambulated down the hallway without any difficulty. Pt denied shortness of breath and advised he felt fine.   Pt sat down and advised he still felt fine and denied shortness of breath Oxygen saturation 92%

## 2022-07-20 NOTE — ED Notes (Signed)
ED TO INPATIENT HANDOFF REPORT  ED Nurse Name and Phone #: [phone]  S Name/Age/Gender Micheal Levy 42 y.o. male Room/Bed: WA16/WA16  Code Status   Code Status: Prior  Home/SNF/Other Home Patient oriented to: self, place, time, and situation Is this baseline? Yes   Triage Complete: Triage complete  Chief Complaint Pneumonia [J18.9]  Triage Note Pt slept overnight in the lobby and checked in when woken up. When asked why pt is here, he states "I don't know". Asked pt if he is in pain, he replies "no". No further information able to be obtained from pt. Pt ambulatory to room. NAD.   Allergies No Known Allergies  Level of Care/Admitting Diagnosis ED Disposition     ED Disposition  Admit   Condition  --   Comment  Hospital Area: [facility] [[id number]]  Level of Care: Telemetry [5]  Admit to tele based on following criteria: Acute CHF  May admit patient to [facility] or [facility] if equivalent level of care is available:: Yes  Covid Evaluation: Confirmed COVID Negative  Diagnosis: Pneumonia [256389]  Admitting Physician: Calvert Cantor [[id number]]  Attending Physician: Calvert Cantor [[id number]]  Certification:: I certify this patient will need inpatient services for at least 2 midnights  Estimated Length of Stay: 3          B Medical/Surgery History Past Medical History:  Diagnosis Date   Chronic kidney disease    Diabetes mellitus without complication    Elevated serum cholesterol 04/2019   Elevated serum creatinine 04/2019   Hypertension    Hypertensive urgency 04/2019   Seizures 03/2017   x 2    Stroke 03/2017   Substance abuse    Vitamin D deficiency 04/2019   Past Surgical History:  Procedure Laterality Date   RIGHT HEART CATH N/A 05/31/2021   Procedure: RIGHT HEART CATH;  Surgeon: Corky Crafts, MD;  Location: [facility] INVASIVE CV LAB;  Service: Cardiovascular;  Laterality: N/A;     A IV Location/Drains/Wounds Patient  Lines/Drains/Airways Status     Active Line/Drains/Airways     Name Placement date Placement time Site Days   Peripheral IV 07/20/22 20 G Left;Posterior Hand 07/20/22  1413  Hand  less than 1            Intake/Output Last 24 hours No intake or output data in the 24 hours ending 07/20/22 1414  Labs/Imaging Results for orders placed or performed during the hospital encounter of 07/20/22 (from the past 48 hour(s))  CBC with Differential     Status: Abnormal   Collection Time: 07/20/22  9:18 AM  Result Value Ref Range   WBC 7.6 4.0 - 10.5 K/uL   RBC 4.54 4.22 - 5.81 MIL/uL   Hemoglobin 12.0 (L) 13.0 - 17.0 g/dL   HCT 37.3 (L) 42.8 - 76.8 %   MCV 84.4 80.0 - 100.0 fL   MCH 26.4 26.0 - 34.0 pg   MCHC 31.3 30.0 - 36.0 g/dL   RDW 11.5 (H) 72.6 - 20.3 %   Platelets 342 150 - 400 K/uL   nRBC 0.0 0.0 - 0.2 %   Neutrophils Relative % 61 %   Neutro Abs 4.6 1.7 - 7.7 K/uL   Lymphocytes Relative 27 %   Lymphs Abs 2.0 0.7 - 4.0 K/uL   Monocytes Relative 9 %   Monocytes Absolute 0.7 0.1 - 1.0 K/uL   Eosinophils Relative 2 %   Eosinophils Absolute 0.1 0.0 - 0.5 K/uL   Basophils  Relative 1 %   Basophils Absolute 0.1 0.0 - 0.1 K/uL   Immature Granulocytes 0 %   Abs Immature Granulocytes 0.03 0.00 - 0.07 K/uL   Reactive, Benign Lymphocytes PRESENT    Polychromasia PRESENT     Comment: Performed at [facility], [address] [place], [place] [address]  Comprehensive metabolic panel     Status: Abnormal   Collection Time: 07/20/22  9:18 AM  Result Value Ref Range   Sodium 136 135 - 145 mmol/L   Potassium 4.1 3.5 - 5.1 mmol/L   Chloride 101 98 - 111 mmol/L   CO2 26 22 - 32 mmol/L   Glucose, Bld 136 (H) 70 - 99 mg/dL    Comment: Glucose reference range applies only to samples taken after fasting for at least 8 hours.   BUN 54 (H) 6 - 20 mg/dL   Creatinine, Ser 4.09 (H) 0.61 - 1.24 mg/dL   Calcium 8.0 (L) 8.9 - 10.3 mg/dL   Total Protein 7.2 6.5 - 8.1 g/dL    Albumin 2.8 (L) 3.5 - 5.0 g/dL   AST 25 15 - 41 U/L   ALT 13 0 - 44 U/L   Alkaline Phosphatase 52 38 - 126 U/L   Total Bilirubin 0.6 0.3 - 1.2 mg/dL   GFR, Estimated 15 (L) >60 mL/min    Comment: (NOTE) Calculated using the CKD-EPI Creatinine Equation (2021)    Anion gap 9 5 - 15    Comment: Performed at [facility], [address], [place], [place] [address]  Troponin I (High Sensitivity)     Status: Abnormal   Collection Time: 07/20/22  9:18 AM  Result Value Ref Range   Troponin I (High Sensitivity) 36 (H) <18 ng/L    Comment: (NOTE) Elevated high sensitivity troponin I (hsTnI) values and significant  changes across serial measurements may suggest ACS but many other  chronic and acute conditions are known to elevate hsTnI results.  Refer to the "Links" section for chest pain algorithms and additional  guidance. Performed at [facility], [address], [place], [place] [address]   Magnesium     Status: None   Collection Time: 07/20/22  9:18 AM  Result Value Ref Range   Magnesium 2.4 1.7 - 2.4 mg/dL    Comment: Performed at [facility], [address] [place], [place] [address]  Brain natriuretic peptide     Status: Abnormal   Collection Time: 07/20/22  9:18 AM  Result Value Ref Range   B Natriuretic Peptide 313.3 (H) 0.0 - 100.0 pg/mL    Comment: Performed at [facility], [address] [place], [place] [address]  Troponin I (High Sensitivity)     Status: Abnormal   Collection Time: 07/20/22 11:30 AM  Result Value Ref Range   Troponin I (High Sensitivity) 33 (H) <18 ng/L    Comment: (NOTE) Elevated high sensitivity troponin I (hsTnI) values and significant  changes across serial measurements may suggest ACS but many other  chronic and acute conditions are known to elevate hsTnI results.  Refer to the "Links" section for chest pain algorithms and additional  guidance. Performed at [facility], [address], [place], [place] [address]    CT Head Wo Contrast  Result Date: 07/20/2022 CLINICAL DATA:  Hypertension EXAM: CT HEAD WITHOUT CONTRAST TECHNIQUE: Contiguous axial images were obtained from the base of the skull through the vertex without intravenous contrast. RADIATION DOSE  REDUCTION: This exam was performed according to the departmental dose-optimization program which includes automated exposure control, adjustment of the mA and/or kV according to patient size and/or use of iterative reconstruction technique. COMPARISON:  09/04/2019 FINDINGS: Brain: No evidence of acute infarction, hemorrhage, hydrocephalus, or extra-axial collection. Stable arachnoid cyst in the right middle cranial fossa. No new mass lesion. Scattered low-density changes within the periventricular and subcortical white matter most compatible with chronic microvascular ischemic change. Mild diffuse cerebral volume loss. Vascular: No hyperdense vessel or unexpected calcification. Skull: Normal. Negative for fracture or focal lesion. Sinuses/Orbits: No acute finding. Other: None. IMPRESSION: 1. No acute intracranial abnormality. 2. Stable chronic microvascular ischemic change and cerebral volume loss. 3. Stable arachnoid cyst in the right middle cranial fossa. Electronically Signed   By: Duanne Guess D.O.   On: 07/20/2022 10:10   DG Chest 2 View  Result Date: 07/20/2022 CLINICAL DATA:  Cough for 1 week. EXAM: CHEST - 2 VIEW COMPARISON:  Chest x-rays dated 07/20/2022 in 05/28/2022. FINDINGS: Continued bilateral diffuse interstitial and alveolar opacities, RIGHT slightly greater than LEFT. Stable cardiomegaly. No pleural effusion or pneumothorax is seen. Osseous structures about the chest are unremarkable. IMPRESSION: Continued bilateral diffuse interstitial and alveolar opacities, RIGHT slightly greater than LEFT, compatible with multifocal pneumonia and/or pulmonary edema. Electronically Signed    By: Bary Richard M.D.   On: 07/20/2022 09:32   DG Chest 2 View  Result Date: 07/20/2022 CLINICAL DATA:  Cough, congestion EXAM: CHEST - 2 VIEW COMPARISON:  05/28/2022 FINDINGS: Multifocal patchy airspace opacities, right lung predominant, favoring multifocal infection over asymmetric interstitial edema. No definite pleural effusions. No pneumothorax. Cardiomegaly. Mild degenerative changes of the visualized thoracolumbar spine. IMPRESSION: Multifocal patchy airspace opacities, right lung predominant, favoring multifocal infection over asymmetric interstitial edema. Electronically Signed   By: Charline Bills M.D.   On: 07/20/2022 01:12    Pending Labs Unresulted Labs (From admission, onward)     Start     Ordered   07/20/22 0981  Pathologist smear review  Once,   STAT        07/20/22 0918            Vitals/Pain Today's Vitals   07/20/22 1219 07/20/22 1226 07/20/22 1400 07/20/22 1405  BP: (!) 177/105  (!) 162/97   Pulse: (!) 56   72  Resp: (!) 22 (!) 22  (!) 23  Temp: 98.2 F (36.8 C)     TempSrc: Oral     SpO2: 92% 92%  94%  Weight:      Height:      PainSc:        Isolation Precautions No active isolations  Medications Medications  cefTRIAXone (ROCEPHIN) 1 g in sodium chloride 0.9 % 100 mL IVPB (1 g Intravenous New Bag/Given 07/20/22 1359)  azithromycin (ZITHROMAX) 500 mg in sodium chloride 0.9 % 250 mL IVPB (has no administration in time range)  torsemide (DEMADEX) tablet 40 mg (40 mg Oral Given 07/20/22 1359)  amLODipine (NORVASC) tablet 10 mg (10 mg Oral Given 07/20/22 0932)  carvedilol (COREG) tablet 25 mg (25 mg Oral Given 07/20/22 0932)  cloNIDine (CATAPRES) tablet 0.3 mg (0.3 mg Oral Given 07/20/22 0931)  hydrALAZINE (APRESOLINE) tablet 100 mg (100 mg Oral Given by Other 07/20/22 0933)    Mobility walks     Focused Assessments Pulmonary Assessment Handoff:  Lung sounds: Bilateral Breath Sounds: Coarse crackles O2 Device: Room Air       R Recommendations: See Admitting Provider Note  Report  given to:   Additional Notes: [id number]

## 2022-07-21 DIAGNOSIS — J189 Pneumonia, unspecified organism: Secondary | ICD-10-CM | POA: Diagnosis not present

## 2022-07-21 LAB — BASIC METABOLIC PANEL
Anion gap: 10 (ref 5–15)
BUN: 48 mg/dL — ABNORMAL HIGH (ref 6–20)
CO2: 24 mmol/L (ref 22–32)
Calcium: 7.9 mg/dL — ABNORMAL LOW (ref 8.9–10.3)
Chloride: 100 mmol/L (ref 98–111)
Creatinine, Ser: 4.18 mg/dL — ABNORMAL HIGH (ref 0.61–1.24)
GFR, Estimated: 17 mL/min — ABNORMAL LOW (ref >60)
Glucose, Bld: 144 mg/dL — ABNORMAL HIGH (ref 70–99)
Potassium: 3 mmol/L — ABNORMAL LOW (ref 3.5–5.1)
Sodium: 134 mmol/L — ABNORMAL LOW (ref 135–145)

## 2022-07-21 LAB — HIV ANTIBODY (ROUTINE TESTING W REFLEX): HIV Screen 4th Generation wRfx: NONREACTIVE

## 2022-07-21 LAB — RESPIRATORY PANEL BY PCR

## 2022-07-21 LAB — CBC
HCT: 36 % — ABNORMAL LOW (ref 39.0–52.0)
Hemoglobin: 11.2 g/dL — ABNORMAL LOW (ref 13.0–17.0)
MCH: 26 pg (ref 26.0–34.0)
MCHC: 31.1 g/dL (ref 30.0–36.0)
MCV: 83.7 fL (ref 80.0–100.0)
Platelets: 385 10*3/uL (ref 150–400)
RBC: 4.3 MIL/uL (ref 4.22–5.81)
RDW: 15.6 % — ABNORMAL HIGH (ref 11.5–15.5)
WBC: 8.6 10*3/uL (ref 4.0–10.5)
nRBC: 0 % (ref 0.0–0.2)

## 2022-07-21 LAB — GLUCOSE, CAPILLARY
Glucose-Capillary: 123 mg/dL — ABNORMAL HIGH (ref 70–99)
Glucose-Capillary: 158 mg/dL — ABNORMAL HIGH (ref 70–99)
Glucose-Capillary: 168 mg/dL — ABNORMAL HIGH (ref 70–99)
Glucose-Capillary: 119 mg/dL — ABNORMAL HIGH (ref 70–99)

## 2022-07-21 LAB — MAGNESIUM: Magnesium: 2.3 mg/dL (ref 1.7–2.4)

## 2022-07-21 LAB — STREP PNEUMONIAE URINARY ANTIGEN: Strep Pneumo Urinary Antigen: NEGATIVE

## 2022-07-21 MED ORDER — POTASSIUM CHLORIDE CRYS ER 20 MEQ PO TBCR
40.0000 meq | EXTENDED_RELEASE_TABLET | Freq: Once | ORAL | Status: AC
  Administered 2022-07-21: 40 meq via ORAL
  Filled 2022-07-21: qty 2

## 2022-07-21 MED ORDER — ALBUTEROL SULFATE (2.5 MG/3ML) 0.083% IN NEBU
2.5000 mg | INHALATION_SOLUTION | Freq: Three times a day (TID) | RESPIRATORY_TRACT | Status: DC
  Administered 2022-07-21 – 2022-07-25 (×14): 2.5 mg via RESPIRATORY_TRACT
  Filled 2022-07-21 (×14): qty 3

## 2022-07-21 MED ORDER — POTASSIUM CHLORIDE 10 MEQ/100ML IV SOLN
10.0000 meq | INTRAVENOUS | Status: AC
  Administered 2022-07-21 (×3): 10 meq via INTRAVENOUS
  Filled 2022-07-21 (×3): qty 100

## 2022-07-21 MED ORDER — BENZONATATE 100 MG PO CAPS
100.0000 mg | ORAL_CAPSULE | Freq: Once | ORAL | Status: AC
  Administered 2022-07-21: 100 mg via ORAL
  Filled 2022-07-21: qty 1

## 2022-07-21 MED ORDER — CLONIDINE HCL 0.1 MG PO TABS
0.3000 mg | ORAL_TABLET | Freq: Three times a day (TID) | ORAL | Status: DC
  Administered 2022-07-21 – 2022-07-25 (×13): 0.3 mg via ORAL
  Filled 2022-07-21 (×13): qty 3

## 2022-07-21 NOTE — Progress Notes (Signed)
Received call from telemetry stating patient had a pause of 7-8 seconds and HR down into the 20's. On-call Np notified. Patient sleepy, but otherwise in NAD.

## 2022-07-21 NOTE — Progress Notes (Signed)
This shift at 0445 pts BP reported 196/123. Attending notified that there are no PRN meds for pts HTN, writer advised to give pt his 0600 dose PO 100 mg hydralazine. BP recheck at 0620 170/119. Will continue to monitor

## 2022-07-21 NOTE — Progress Notes (Signed)
[facility] Progress Note  Patient: Micheal Levy     YQI:[id number]  DOA: 07/20/2022   PCP: Barbette Merino, NP       Brief hospital course: Micheal Levy is a 42 y.o. male with medical history of this is a 42 year old male with chronic kidney disease, diabetes mellitus, hypertension, history of CVA who presented to the ED on 4/14 with cough and wanted to be tested for COVID.     He fell asleep in the lobby and when he awoke he was found to be confused. He has had a cough for about 1 wk and has sputum. Has had a dry throat. No hemoptysis. He has some chest pain when he coughs. He had 3 loose stools yesterday evening but none since and none prior. In regard to his renal failure and edema, he does not feel more swollen than usual. He has been taking his meds appropriately.  Subjective:  Has a lot of coughing and phlegm.  Assessment and Plan: Principal Problem:   Bilateral pneumonia- metapneumovirus + - possibly bacterial superinfection - U strep and HIV neg - cont antibiotics - pulse ox 90% on room air today- cont current management  Active Problems: Altered mental status - while in triage and resolved on its own     Hypertensive urgency - improved after home meds given in ED - cont home meds   Mildly elevated troponin - no chest pain consistent with ACS - Possibly due to HTN vs CKD      CKD (chronic kidney disease) stage 4, GFR 15-29 ml/min - Cr as baseline at ~ 3.5, GFR ~ 15 - cont Torsemide and K replacement     Insulin-requiring or dependent type II diabetes mellitus Hold lantus which is 5 U QHS - not on Novolog - SSI ordered   Sesonal allergies - cont Flonase   AOCD, normocytic anemia - Hgb ~ 12     Code Status: Full Code Consultants: General exam: Appears comfortable  HEENT: oral mucosa moist Respiratory system: + rhonchi, cough Cardiovascular system: S1 & S2 heard  Gastrointestinal system: Abdomen soft, non-tender, nondistended. Normal bowel  sounds   Extremities: No cyanosis, clubbing or edema Psychiatry:  Mood & affect appropriate.   Level of Care: Level of care: Telemetry Total time on patient care: 35 min DVT prophylaxis:  heparin injection 5,000 Units Start: 07/20/22 2200 SCDs Start: 07/20/22 1543     Objective:   Vitals:   07/21/22 0807 07/21/22 0902 07/21/22 1333 07/21/22 1423  BP: (!) 188/116  (!) 179/110   Pulse: 70  71   Resp: 18  18   Temp: 98.1 F (36.7 C)  98.2 F (36.8 C)   TempSrc: Oral  Oral   SpO2: 94% 91% 92% 94%  Weight:      Height:       Filed Weights   07/20/22 0805 07/20/22 1556  Weight: 117.5 kg 115.7 kg   Exam: General exam: Appears comfortable  HEENT: oral mucosa moist Respiratory system: rhonchi and cough  Cardiovascular system: S1 & S2 heard  Gastrointestinal system: Abdomen soft, non-tender, nondistended. Normal bowel sounds   Extremities: No cyanosis, clubbing or edema Psychiatry:  Mood & affect appropriate.      CBC: Recent Labs  Lab 07/20/22 0918 07/20/22 1605 07/21/22 0535  WBC 7.6 8.9 8.6  NEUTROABS 4.6  --   --   HGB 12.0* 11.5* 11.2*  HCT 38.3* 37.1* 36.0*  MCV 84.4 84.5 83.7  PLT 342 336 385  Basic Metabolic Panel: Recent Labs  Lab 07/20/22 0918 07/20/22 1605 07/21/22 0535 07/21/22 0635  NA 136  --  134*  --   K 4.1  --  3.0*  --   CL 101  --  100  --   CO2 26  --  24  --   GLUCOSE 136*  --  144*  --   BUN 54*  --  48*  --   CREATININE 4.58* 4.39* 4.18*  --   CALCIUM 8.0*  --  7.9*  --   MG 2.4  --   --  2.3   GFR: Estimated Creatinine Clearance: 28.1 mL/min (A) (by C-G formula based on SCr of 4.18 mg/dL (H)).  Scheduled Meds:  albuterol  2.5 mg Nebulization TID   amLODipine  10 mg Oral Daily   aspirin EC  81 mg Oral Daily   carvedilol  25 mg Oral BID WC   cloNIDine  0.3 mg Oral TID   fluticasone  2 spray Each Nare BID   heparin  5,000 Units Subcutaneous Q8H   hydrALAZINE  100 mg Oral Q8H   insulin aspart  0-15 Units Subcutaneous TID WC    isosorbide mononitrate  120 mg Oral Daily   sodium chloride flush  3 mL Intravenous Q12H   torsemide  80 mg Oral Daily   Continuous Infusions:  sodium chloride     azithromycin 500 mg (07/21/22 1446)   cefTRIAXone (ROCEPHIN)  IV 2 g (07/21/22 0950)   Imaging and lab data was personally reviewed CT Head Wo Contrast  Result Date: 07/20/2022 CLINICAL DATA:  Hypertension EXAM: CT HEAD WITHOUT CONTRAST TECHNIQUE: Contiguous axial images were obtained from the base of the skull through the vertex without intravenous contrast. RADIATION DOSE REDUCTION: This exam was performed according to the departmental dose-optimization program which includes automated exposure control, adjustment of the mA and/or kV according to patient size and/or use of iterative reconstruction technique. COMPARISON:  09/04/2019 FINDINGS: Brain: No evidence of acute infarction, hemorrhage, hydrocephalus, or extra-axial collection. Stable arachnoid cyst in the right middle cranial fossa. No new mass lesion. Scattered low-density changes within the periventricular and subcortical white matter most compatible with chronic microvascular ischemic change. Mild diffuse cerebral volume loss. Vascular: No hyperdense vessel or unexpected calcification. Skull: Normal. Negative for fracture or focal lesion. Sinuses/Orbits: No acute finding. Other: None. IMPRESSION: 1. No acute intracranial abnormality. 2. Stable chronic microvascular ischemic change and cerebral volume loss. 3. Stable arachnoid cyst in the right middle cranial fossa. Electronically Signed   By: Duanne Guess D.O.   On: 07/20/2022 10:10   DG Chest 2 View  Result Date: 07/20/2022 CLINICAL DATA:  Cough for 1 week. EXAM: CHEST - 2 VIEW COMPARISON:  Chest x-rays dated 07/20/2022 in 05/28/2022. FINDINGS: Continued bilateral diffuse interstitial and alveolar opacities, RIGHT slightly greater than LEFT. Stable cardiomegaly. No pleural effusion or pneumothorax is seen. Osseous  structures about the chest are unremarkable. IMPRESSION: Continued bilateral diffuse interstitial and alveolar opacities, RIGHT slightly greater than LEFT, compatible with multifocal pneumonia and/or pulmonary edema. Electronically Signed   By: Bary Richard M.D.   On: 07/20/2022 09:32   DG Chest 2 View  Result Date: 07/20/2022 CLINICAL DATA:  Cough, congestion EXAM: CHEST - 2 VIEW COMPARISON:  05/28/2022 FINDINGS: Multifocal patchy airspace opacities, right lung predominant, favoring multifocal infection over asymmetric interstitial edema. No definite pleural effusions. No pneumothorax. Cardiomegaly. Mild degenerative changes of the visualized thoracolumbar spine. IMPRESSION: Multifocal patchy airspace opacities, right lung predominant, favoring  multifocal infection over asymmetric interstitial edema. Electronically Signed   By: Charline Bills M.D.   On: 07/20/2022 01:12    LOS: 1 day   Author: Calvert Cantor  07/21/2022 4:53 PM  To contact [facility]>   Check the care team in [facility] and look for the attending/consulting [facility] provider listed  Log into [email or web address] and use [place] universal password   Go to> "[facility]"  and find provider  If you still have difficulty reaching the provider, please page the [facility] (Director on Call) for the Hospitalists listed on amion

## 2022-07-22 ENCOUNTER — Other Ambulatory Visit (HOSPITAL_COMMUNITY): Payer: Self-pay

## 2022-07-22 ENCOUNTER — Other Ambulatory Visit: Payer: Self-pay

## 2022-07-22 DIAGNOSIS — J189 Pneumonia, unspecified organism: Secondary | ICD-10-CM | POA: Diagnosis not present

## 2022-07-22 LAB — BASIC METABOLIC PANEL WITH GFR
Anion gap: 11 (ref 5–15)
BUN: 42 mg/dL — ABNORMAL HIGH (ref 6–20)
CO2: 23 mmol/L (ref 22–32)
Calcium: 8.4 mg/dL — ABNORMAL LOW (ref 8.9–10.3)
Chloride: 103 mmol/L (ref 98–111)
Creatinine, Ser: 3.9 mg/dL — ABNORMAL HIGH (ref 0.61–1.24)
GFR, Estimated: 19 mL/min — ABNORMAL LOW
Glucose, Bld: 125 mg/dL — ABNORMAL HIGH (ref 70–99)
Potassium: 3.6 mmol/L (ref 3.5–5.1)
Sodium: 137 mmol/L (ref 135–145)

## 2022-07-22 LAB — GLUCOSE, CAPILLARY
Glucose-Capillary: 158 mg/dL — ABNORMAL HIGH (ref 70–99)
Glucose-Capillary: 145 mg/dL — ABNORMAL HIGH (ref 70–99)
Glucose-Capillary: 220 mg/dL — ABNORMAL HIGH (ref 70–99)
Glucose-Capillary: 91 mg/dL (ref 70–99)

## 2022-07-22 LAB — MAGNESIUM: Magnesium: 2.2 mg/dL (ref 1.7–2.4)

## 2022-07-22 LAB — LEGIONELLA PNEUMOPHILA SEROGP 1 UR AG: L. pneumophila Serogp 1 Ur Ag: NEGATIVE

## 2022-07-22 MED ORDER — AZITHROMYCIN 250 MG PO TABS
500.0000 mg | ORAL_TABLET | Freq: Every day | ORAL | Status: AC
  Administered 2022-07-23 – 2022-07-24 (×2): 500 mg via ORAL
  Filled 2022-07-22 (×2): qty 2

## 2022-07-22 MED ORDER — CLONIDINE 0.3 MG/24HR TD PTWK
0.3000 mg | MEDICATED_PATCH | TRANSDERMAL | 0 refills | Status: DC
  Filled 2022-07-22: qty 4, 28d supply, fill #0

## 2022-07-22 NOTE — Progress Notes (Signed)
[facility] Progress Note  Patient: Micheal Levy     UJW:[id number]  DOA: 07/20/2022   PCP: Barbette Merino, NP       Brief hospital course: Micheal Levy is a 42 y.o. male with medical history of this is a 42 year old male with chronic kidney disease, diabetes mellitus, hypertension, history of CVA who presented to the ED on 4/14 with cough and wanted to be tested for COVID.     He fell asleep in the lobby and when he awoke he was found to be confused. He has had a cough for about 1 wk and has sputum. Has had a dry throat. No hemoptysis. He has some chest pain when he coughs. He had 3 loose stools yesterday evening but none since and none prior. In regard to his renal failure and edema, he does not feel more swollen than usual. He has been taking his meds appropriately.  Subjective:  Even more phlegm and cough today.   Assessment and Plan: Principal Problem:   Bilateral pneumonia- metapneumovirus + - possibly bacterial superinfection - U strep and HIV neg - cont antibiotics, nebs and cough meds - pulse ox 90% on room air today- cont current management  Active Problems: Sinus pause - ~ 6.5 sec pause today- asymptomatic- hold Coreg- OK to continue Clonidine for now - follow for now - cont ARB and Aldactone when Cr improves further  Altered mental status - while in triage and resolved on its own- may have had a sinus pause-      Hypertensive urgency - cont home meds- BP quite high today but asymptomatic - may benefit from clonidine patch over TID clonidine   Mildly elevated troponin - no chest pain consistent with ACS - Possibly due to HTN vs CKD    AKI with CKD (chronic kidney disease) stage 4, GFR 15-29 ml/min - Cr as baseline at ~ 3.5, GFR ~ 15 - cont Torsemide and K replacement - Cr 3.90 today improved from 4.58     Insulin-requiring or dependent type II diabetes mellitus Hold lantus which is 5 U QHS - not on Novolog - SSI ordered   Sesonal  allergies - cont Flonase   AOCD, normocytic anemia - Hgb ~ 12  Morbid obesity Body mass index is 38.78 kg/m. - may be a candidate for GLP 1 agonist      Code Status: Full Code Level of Care: Level of care: Telemetry Total time on patient care: 35 min DVT prophylaxis:  heparin injection 5,000 Units Start: 07/20/22 2200 SCDs Start: 07/20/22 1543   General exam: Appears comfortable  HEENT: oral mucosa moist Respiratory system:  rhonchi, cough, poor inspiratory effort Cardiovascular system: S1 & S2 heard  Gastrointestinal system: Abdomen soft, non-tender, nondistended. Normal bowel sounds   Extremities: No cyanosis, clubbing or edema Psychiatry:  Mood & affect appropriate.    Objective:   Vitals:   07/21/22 2137 07/22/22 0605 07/22/22 0751 07/22/22 1134  BP: (!) 175/99 (!) 217/123 (!) 212/115 (!) 195/118  Pulse: 70 85 87 68  Resp: (!) 24 20 (!) 24 (!) 26  Temp: 97.8 F (36.6 C) 98.1 F (36.7 C) 99 F (37.2 C) 99 F (37.2 C)  TempSrc: Oral Oral Oral Oral  SpO2: 95% 92% 92% 93%  Weight:      Height:       Filed Weights   07/20/22 0805 07/20/22 1556  Weight: 117.5 kg 115.7 kg   Exam: General exam: Appears comfortable  HEENT: oral mucosa  moist Respiratory system: rhonchi and cough  Cardiovascular system: S1 & S2 heard  Gastrointestinal system: Abdomen soft, non-tender, nondistended. Normal bowel sounds   Extremities: No cyanosis, clubbing or edema Psychiatry:  Mood & affect appropriate.      CBC: Recent Labs  Lab 07/20/22 0918 07/20/22 1605 07/21/22 0535  WBC 7.6 8.9 8.6  NEUTROABS 4.6  --   --   HGB 12.0* 11.5* 11.2*  HCT 38.3* 37.1* 36.0*  MCV 84.4 84.5 83.7  PLT 342 336 385    Basic Metabolic Panel: Recent Labs  Lab 07/20/22 0918 07/20/22 1605 07/21/22 0535 07/21/22 0635 07/22/22 0601  NA 136  --  134*  --  137  K 4.1  --  3.0*  --  3.6  CL 101  --  100  --  103  CO2 26  --  24  --  23  GLUCOSE 136*  --  144*  --  125*  BUN 54*  --  48*   --  42*  CREATININE 4.58* 4.39* 4.18*  --  3.90*  CALCIUM 8.0*  --  7.9*  --  8.4*  MG 2.4  --   --  2.3  --     GFR: Estimated Creatinine Clearance: 30.2 mL/min (A) (by C-G formula based on SCr of 3.9 mg/dL (H)).  Scheduled Meds:  albuterol  2.5 mg Nebulization TID   amLODipine  10 mg Oral Daily   aspirin EC  81 mg Oral Daily   cloNIDine  0.3 mg Oral TID   fluticasone  2 spray Each Nare BID   heparin  5,000 Units Subcutaneous Q8H   hydrALAZINE  100 mg Oral Q8H   insulin aspart  0-15 Units Subcutaneous TID WC   isosorbide mononitrate  120 mg Oral Daily   sodium chloride flush  3 mL Intravenous Q12H   torsemide  80 mg Oral Daily   Continuous Infusions:  sodium chloride     azithromycin 500 mg (07/22/22 1443)   cefTRIAXone (ROCEPHIN)  IV 2 g (07/22/22 1018)   Imaging and lab data was personally reviewed No results found.  LOS: 2 days   Author: Calvert Cantor  07/22/2022 2:58 PM  To contact [facility]>   Check the care team in [facility] and look for the attending/consulting [facility] provider listed  Log into [email or web address] and use [place]'s universal password   Go to> "[facility]"  and find provider  If you still have difficulty reaching the provider, please page the [facility] (Director on Call) for the Hospitalists listed on amion

## 2022-07-22 NOTE — Progress Notes (Signed)
Patient ambulated on RA 250 feet, SaO2 90%. C/o SOB w/wheezing upon return to room, called RT for Neb tx.

## 2022-07-22 NOTE — Progress Notes (Signed)
  Transition of Care [facility]) Screening Note   Patient Details  Name: Micheal Levy Date of Birth: June 16, 1980   Transition of Care [facility]) CM/SW Contact:    Otelia Santee, LCSW Phone Number: 07/22/2022, 9:14 AM    Transition of Care Department [facility]) has reviewed patient and no TOC needs have been identified at this time. We will continue to monitor patient advancement through interdisciplinary progression rounds. If new patient transition needs arise, please place a TOC consult.

## 2022-07-22 NOTE — Progress Notes (Signed)
   07/22/22 0605  Assess: MEWS Score  Temp 98.1 F (36.7 C)  BP (!) 217/123  MAP (mmHg) 150  Pulse Rate 85  Resp 20  SpO2 92 %  O2 Device Room Air  Assess: MEWS Score  MEWS Temp 0  MEWS Systolic 2  MEWS Pulse 0  MEWS RR 0  MEWS LOC 0  MEWS Score 2  MEWS Score Color Yellow  Assess: if the MEWS score is Yellow or Red  Were vital signs taken at a resting state? Yes  Focused Assessment No change from prior assessment  Does the patient meet 2 or more of the SIRS criteria? No  MEWS guidelines implemented  Yes, yellow  Treat  MEWS Interventions Considered administering scheduled or prn medications/treatments as ordered  Take Vital Signs  Increase Vital Sign Frequency  Yellow: Q2hr x1, continue Q4hrs until patient remains green for 12hrs  Escalate  MEWS: Escalate Yellow: Discuss with charge nurse and consider notifying provider and/or RRT  Notify: Charge Nurse/RN  Name of Charge Nurse/RN Miachel Roux, RN  Provider Notification  Provider Name/Title Chinita Greenland, NP  Date Provider Notified 07/22/22  Time Provider Notified [phone]  Method of Notification Page  Notification Reason Change in status  Provider response See new orders  Date of Provider Response 07/22/22  Time of Provider Response [id number]  Assess: SIRS CRITERIA  SIRS Temperature  0  SIRS Pulse 0  SIRS Respirations  0  SIRS WBC 0  SIRS Score Sum  0   Clonidine given early per NP.

## 2022-07-22 NOTE — Plan of Care (Signed)

## 2022-07-23 DIAGNOSIS — I160 Hypertensive urgency: Secondary | ICD-10-CM

## 2022-07-23 DIAGNOSIS — J189 Pneumonia, unspecified organism: Secondary | ICD-10-CM | POA: Diagnosis not present

## 2022-07-23 DIAGNOSIS — R410 Disorientation, unspecified: Secondary | ICD-10-CM | POA: Diagnosis not present

## 2022-07-23 LAB — BASIC METABOLIC PANEL
Anion gap: 9 (ref 5–15)
BUN: 39 mg/dL — ABNORMAL HIGH (ref 6–20)
CO2: 25 mmol/L (ref 22–32)
Calcium: 8.6 mg/dL — ABNORMAL LOW (ref 8.9–10.3)
Chloride: 104 mmol/L (ref 98–111)
Creatinine, Ser: 3.77 mg/dL — ABNORMAL HIGH (ref 0.61–1.24)
GFR, Estimated: 19 mL/min — ABNORMAL LOW (ref >60)
Glucose, Bld: 126 mg/dL — ABNORMAL HIGH (ref 70–99)
Potassium: 3.6 mmol/L (ref 3.5–5.1)
Sodium: 138 mmol/L (ref 135–145)

## 2022-07-23 LAB — GLUCOSE, CAPILLARY
Glucose-Capillary: 106 mg/dL — ABNORMAL HIGH (ref 70–99)
Glucose-Capillary: 159 mg/dL — ABNORMAL HIGH (ref 70–99)
Glucose-Capillary: 170 mg/dL — ABNORMAL HIGH (ref 70–99)
Glucose-Capillary: 97 mg/dL (ref 70–99)

## 2022-07-23 MED ORDER — HYDRALAZINE HCL 20 MG/ML IJ SOLN
10.0000 mg | INTRAMUSCULAR | Status: DC | PRN
  Administered 2022-07-24 – 2022-07-25 (×2): 10 mg via INTRAVENOUS
  Filled 2022-07-23 (×2): qty 1

## 2022-07-23 MED ORDER — TORSEMIDE 20 MG PO TABS
80.0000 mg | ORAL_TABLET | Freq: Two times a day (BID) | ORAL | Status: DC
  Administered 2022-07-23: 80 mg via ORAL
  Filled 2022-07-23 (×2): qty 4

## 2022-07-23 NOTE — Plan of Care (Signed)

## 2022-07-23 NOTE — Hospital Course (Signed)
42 y.o. male with medical history of this is a 42 year old male with chronic kidney disease, diabetes mellitus, hypertension, history of CVA who presented to the ED on 4/14 with cough, found to be pos for metapneumovirus

## 2022-07-23 NOTE — Progress Notes (Signed)
  Progress Note   Patient: Micheal Levy ZOX:[id number] DOB: Mar 12, 1981 DOA: 07/20/2022     3 DOS: the patient was seen and examined on 07/23/2022   Brief hospital course: 42 y.o. male with medical history of this is a 42 year old male with chronic kidney disease, diabetes mellitus, hypertension, history of CVA who presented to the ED on 4/14 with cough, found to be pos for metapneumovirus  Assessment and Plan: Bilateral pneumonia- metapneumovirus + - possibly bacterial superinfection - U strep and HIV neg - cont antibiotics, nebs and cough meds - on room air   Active Problems: Sinus pause - ~ 6.5 sec pause on 4/16, prompting beta blocker to be on hold -continue tele   Altered mental status - while in triage and resolved on its own     Hypertensive urgency - Continued on 0.3mg  TID clonidine,  norvasc,  q8h hydralazine,  torsemide -Discussed with Nephrology who recommends increasing torsemide to  bid -Recheck bmet in AM   Mildly elevated troponin - no chest pain consistent with ACS - Possibly due to HTN vs CKD    AKI with CKD (chronic kidney disease) stage 4, GFR 15-29 ml/min - Cr as baseline at ~ 3.5, GFR ~ 15 - cont Torsemide and K replacement - Cr improved to 3.77     Insulin-requiring or dependent type II diabetes mellitus Hold lantus which is 5 U QHS - not on Novolog - SSI ordered   Sesonal allergies - cont Flonase   AOCD, normocytic anemia - Hgb ~ 12   Morbid obesity Body mass index is 38.78 kg/m. - may be a candidate for GLP 1 agonist   Subjective: Coughing today. Hoping to go home soon  Physical Exam: Vitals:   07/23/22 0434 07/23/22 0756 07/23/22 1331 07/23/22 1346  BP: (!) 188/117  (!) 180/113   Pulse: 67  73   Resp: 18  19   Temp: 98.5 F (36.9 C)  98 F (36.7 C)   TempSrc: Oral  Oral   SpO2: 95% 95% 94% 95%  Weight:      Height:       General exam: Awake, laying in bed, in nad Respiratory system: Normal respiratory  effort, no wheezing Cardiovascular system: regular rate, s1, s2 Gastrointestinal system: Soft, nondistended, positive BS Central nervous system: CN2-12 grossly intact, strength intact Extremities: Perfused, no clubbing Skin: Normal skin turgor, no notable skin lesions seen Psychiatry: Mood normal // no visual hallucinations   Data Reviewed:  Labs reviewed: Na 138, K 3.6, Cr 3.77  Family Communication: Pt in room, family not at bedside  Disposition: Status is: Inpatient Remains inpatient appropriate because: Severity of illness  Planned Discharge Destination: Home    Author: Rickey Barbara, MD 07/23/2022 4:25 PM  For on call review [email or web address].

## 2022-07-24 ENCOUNTER — Inpatient Hospital Stay (HOSPITAL_COMMUNITY): Payer: Medicaid Other

## 2022-07-24 DIAGNOSIS — I160 Hypertensive urgency: Secondary | ICD-10-CM | POA: Diagnosis not present

## 2022-07-24 DIAGNOSIS — J189 Pneumonia, unspecified organism: Secondary | ICD-10-CM | POA: Diagnosis not present

## 2022-07-24 DIAGNOSIS — R410 Disorientation, unspecified: Secondary | ICD-10-CM | POA: Diagnosis not present

## 2022-07-24 LAB — COMPREHENSIVE METABOLIC PANEL
ALT: 10 U/L (ref 0–44)
AST: 12 U/L — ABNORMAL LOW (ref 15–41)
Albumin: 2.6 g/dL — ABNORMAL LOW (ref 3.5–5.0)
Alkaline Phosphatase: 47 U/L (ref 38–126)
Anion gap: 10 (ref 5–15)
BUN: 39 mg/dL — ABNORMAL HIGH (ref 6–20)
CO2: 26 mmol/L (ref 22–32)
Calcium: 8.6 mg/dL — ABNORMAL LOW (ref 8.9–10.3)
Chloride: 103 mmol/L (ref 98–111)
Creatinine, Ser: 3.89 mg/dL — ABNORMAL HIGH (ref 0.61–1.24)
GFR, Estimated: 19 mL/min — ABNORMAL LOW (ref >60)
Glucose, Bld: 103 mg/dL — ABNORMAL HIGH (ref 70–99)
Potassium: 3.3 mmol/L — ABNORMAL LOW (ref 3.5–5.1)
Sodium: 139 mmol/L (ref 135–145)
Total Bilirubin: 0.6 mg/dL (ref 0.3–1.2)
Total Protein: 6.8 g/dL (ref 6.5–8.1)

## 2022-07-24 LAB — CBC
HCT: 36.7 % — ABNORMAL LOW (ref 39.0–52.0)
Hemoglobin: 11.1 g/dL — ABNORMAL LOW (ref 13.0–17.0)
MCH: 25.8 pg — ABNORMAL LOW (ref 26.0–34.0)
MCHC: 30.2 g/dL (ref 30.0–36.0)
MCV: 85.3 fL (ref 80.0–100.0)
Platelets: 495 10*3/uL — ABNORMAL HIGH (ref 150–400)
RBC: 4.3 MIL/uL (ref 4.22–5.81)
RDW: 15.4 % (ref 11.5–15.5)
WBC: 6 10*3/uL (ref 4.0–10.5)
nRBC: 0 % (ref 0.0–0.2)

## 2022-07-24 LAB — GLUCOSE, CAPILLARY
Glucose-Capillary: 143 mg/dL — ABNORMAL HIGH (ref 70–99)
Glucose-Capillary: 154 mg/dL — ABNORMAL HIGH (ref 70–99)
Glucose-Capillary: 161 mg/dL — ABNORMAL HIGH (ref 70–99)
Glucose-Capillary: 87 mg/dL (ref 70–99)

## 2022-07-24 LAB — MAGNESIUM: Magnesium: 2.1 mg/dL (ref 1.7–2.4)

## 2022-07-24 MED ORDER — TORSEMIDE 20 MG PO TABS
80.0000 mg | ORAL_TABLET | Freq: Every day | ORAL | Status: DC
  Administered 2022-07-24 – 2022-07-25 (×2): 80 mg via ORAL
  Filled 2022-07-24 (×2): qty 4

## 2022-07-24 MED ORDER — POTASSIUM CHLORIDE CRYS ER 20 MEQ PO TBCR
60.0000 meq | EXTENDED_RELEASE_TABLET | Freq: Once | ORAL | Status: AC
  Administered 2022-07-24: 60 meq via ORAL
  Filled 2022-07-24: qty 3

## 2022-07-24 MED ORDER — LOSARTAN POTASSIUM 50 MG PO TABS
25.0000 mg | ORAL_TABLET | Freq: Two times a day (BID) | ORAL | Status: DC
  Administered 2022-07-24 – 2022-07-25 (×3): 25 mg via ORAL
  Filled 2022-07-24 (×3): qty 1

## 2022-07-24 MED ORDER — TORSEMIDE 20 MG PO TABS: 80.0000 mg | ORAL_TABLET | Freq: Every day | ORAL | Status: DC

## 2022-07-24 NOTE — Consult Note (Addendum)
Renal Service Consult Note [facility]  Micheal Levy 07/24/2022 Micheal Krabbe, MD Requesting Physician: Dr. Rhona Leavens  Reason for Consult: CKD pt w/ uncont HTN HPI: The patient is a 42 y.o. year-old w/ PMH as below who presented to ED 4/14 due to cough and congestion for 3 days. No f/c/s or wt gain/ loss. Wanted COVID test. COVID was negative, but CXR showed bilat IS changes and alveolar opacities vs pulm edema. Pt was admitted for bilat PNA and some AMS in the ED waiting room. Pt's MS improved but his BP was uncontrolled despite 3-4 days in hospital. We are asked to see for CKD/ uncont BP.   Pt seen in room. Pt is f/b Dr. Ronalee Belts at [facility]. He was here in feb 2023 for marked vol overload w/ CKD, and again in feb 2024 for the same. In 2023 he left after diuresis at 102kg and in feb 2024 left after diuresis at 107kg. Today the pt doesn't c/o any fluid excess in the legs or otherwise. No leg swelling. Weighed him standing at 110kg (bed wt = 115kg). Has no c/o's today. Cough and chest feeling better overall.   ROS - denies CP, no joint pain, no HA, no blurry vision, no rash, no diarrhea, no nausea/ vomiting, no dysuria, no difficulty voiding   Past Medical History  Past Medical History:  Diagnosis Date   Chronic kidney disease    Diabetes mellitus without complication    Elevated serum cholesterol 04/2019   Elevated serum creatinine 04/2019   Hypertension    Hypertensive urgency 04/2019   Seizures 03/2017   x 2    Stroke 03/2017   Substance abuse    Vitamin D deficiency 04/2019   Past Surgical History  Past Surgical History:  Procedure Laterality Date   RIGHT HEART CATH N/A 05/31/2021   Procedure: RIGHT HEART CATH;  Surgeon: Corky Crafts, MD;  Location: [facility] INVASIVE CV LAB;  Service: Cardiovascular;  Laterality: N/A;   Family History  Family History  Problem Relation Age of Onset   Diabetes Father    Heart attack Father    Diabetes Sister    Hypertension  Brother    Social History  reports that he quit smoking about 23 years ago. His smoking use included cigarettes. He has never used smokeless tobacco. He reports current alcohol use. He reports current drug use. Drugs: Cocaine and Marijuana. Allergies No Known Allergies Home medications Prior to Admission medications   Medication Sig Start Date End Date Taking? Authorizing Provider  albuterol (VENTOLIN HFA) 108 (90 Base) MCG/ACT inhaler Inhale 2 puffs into the lungs every 6 (six) hours as needed for wheezing or shortness of breath. 05/30/22  Yes Sheikh, Omair Latif, DO  amLODipine (NORVASC) 10 MG tablet Take 10 mg by mouth daily.   Yes [provider]  aspirin EC 81 MG tablet Take 1 tablet (81 mg total) by mouth daily. 05/30/22  Yes Sheikh, Omair Latif, DO  carvedilol (COREG) 25 MG tablet Take 25 mg by mouth 2 (two) times daily with a meal.   Yes [provider]  cloNIDine (CATAPRES - DOSED IN MG/24 HR) 0.3 mg/24hr patch Place 1 patch (0.3 mg total) onto the skin every 7 (seven) days. 07/22/22  Yes Calvert Cantor, MD  cloNIDine (CATAPRES) 0.3 MG tablet Take 1 tablet (0.3 mg total) by mouth 3 (three) times daily. 06/08/21  Yes [name], MD  fluticasone [facility]) 50 MCG/ACT nasal spray Place 2 sprays into both nostrils  2 (two) times daily. Patient taking differently: Place 2 sprays into both nostrils daily as needed for allergies. 05/30/22  Yes Sheikh, Omair Latif, DO  hydrALAZINE (APRESOLINE) 100 MG tablet Take 1 tablet (100 mg total) by mouth every 8 (eight) hours. Patient taking differently: Take 100 mg by mouth 3 (three) times daily. 06/24/21  Yes [name], Kardie, DO  insulin glargine (LANTUS SOLOSTAR) 100 UNIT/ML Solostar Pen Inject 5 Units into the skin daily at 10 pm. 06/08/21 07/20/22 Yes [name], MD  isosorbide mononitrate (IMDUR) 120 MG 24 hr tablet Take 1 tablet (120 mg total) by mouth daily. 06/09/21  Yes [name], MD  Multiple Vitamins-Minerals  (MULTIVITAMIN ADULTS) TABS Take 1 tablet by mouth daily.   Yes [provider]  potassium chloride SA (KLOR-CON M) 20 MEQ tablet Take 40 mEq by mouth daily. 07/09/22  Yes [provider]  torsemide (DEMADEX) 20 MG tablet Take 80 mg by mouth daily. 07/09/22  Yes [provider]  Blood Glucose Monitoring Suppl (TRUE METRIX AIR GLUCOSE METER) DEVI 1 each by Does not apply route 4 (four) times daily -  before meals and at bedtime. 04/19/19   [name], FNP  glucose blood (TRUE METRIX BLOOD GLUCOSE TEST) test strip Use as instructed Patient taking differently: 1 each by Other route as directed. 04/19/19   [name], FNP  Insulin Pen Needle (PEN NEEDLES) 31G X 6 MM MISC 1 each by Does not apply route at bedtime. 10/05/17   Massie Maroon, FNP  INSULIN SYRINGE .5CC/29G (B-D INS SYR ULTRAFINE .5CC/29G) 29G X 1/2" 0.5 ML MISC 1 each by Does not apply route at bedtime. 04/02/17   Massie Maroon, FNP  Lancets MISC 1 each by Does not apply route 4 (four) times daily -  before meals and at bedtime. 04/19/19   [name], FNP     Vitals:   07/24/22 1207 07/24/22 1314 07/24/22 1421 07/24/22 1514  BP: (!) 180/127 (!) 150/104  (!) 147/85  Pulse: (!) 59 (!) 57    Resp:      Temp:      TempSrc:      SpO2:   94%   Weight:      Height:       Exam Gen alert, no distress, on room air No rash, cyanosis or gangrene Sclera anicteric, throat clear  No jvd or bruits Chest clear bilat to bases, no rales/ wheezing RRR no MRG Abd soft ntnd no mass or ascites +bs GU normal male MS no joint effusions or deformity Ext no LE or UE edema, no wounds or ulcers Neuro is alert, Ox 3 , nf      Home meds include - albuterol, insulin glargine, mVI, klorcaon, demadex 80 qam, norvasc 10, asa, coreg 25 bid, catapres 0.3 tid, hydralazine 100 tid, imdur 120 qd, prns    Date                          Creat               eGFR    2017                         1.23    2018                          1.30- 2.86  2019                         1.22- 1.30    2021                         1.59- 2.75    2022                         1.65- 2.92    Jan 2023                  1.80- 2.73    2/01- 05/11/21            3.03- 3.35    2/11- 06/08/21            1.54- 4.15    06/24/21                     2.45    Sept 2023                 2.41- 2.76       28- 33 ml/min    2/15- 05/30/22  3.40- 4.61 15- 22 ml/min    4/14    4.58  15 ml/min    4/15    4.18    4/16    3.90  19    4/17    3.77    4/18    3.89  19 ml/miin     Assessment/ Plan: Uncontrolled HTN - would resume his losartan which was held last admit due to rising creat while aggressively diuresing. Hopefully this will help. Coreg was stopped here due to a 6 second pause I am told. Will start losartan at 25 bid today. He is on norvasc, catapres and hydralazine as well. Will follow.  CKD 4 - creat stable in b/l range of 3.4- 4.6, from feb 2024, eGFR 15-22 ml/min. No uremic signs or symptoms. Pt is pleasant and does not appear acutely ill.  Volume - suprisingly no vol overload like on prior admissions. CXR changes could be d/w CHF but he also MPV acute viral infection. His exam doesn't fit w/ volume overload this admission and his standing wt is 110kg which is only 3kg up from last d/c weight of 107kg. It is likely that the CXR changes are viral pneumonitis. Would keep his demadex at 80 mg po qam.  MPV infection - w/ bilat viral pna most likely by CXR. Overall he appears to be improving, is up in the room, walking about.  DM2      [name]  MD [facility] 07/24/2022, 3:35 PM  Recent Labs  Lab 07/20/22 0918 07/20/22 1605 07/21/22 0535 07/22/22 0601 07/23/22 0540 07/24/22 0549  HGB 12.0*   < > 11.2*  --   --  11.1*  ALBUMIN 2.8*  --   --   --   --  2.6*  CALCIUM 8.0*  --  7.9*   < > 8.6* 8.6*  CREATININE 4.58*   < > 4.18*   < > 3.77* 3.89*  K 4.1  --  3.0*   < > 3.6 3.3*   < > = values in this interval not displayed.    Inpatient medications:  albuterol  2.5 mg Nebulization TID   amLODipine  10 mg Oral Daily   aspirin EC  81 mg Oral Daily   cloNIDine  0.3 mg Oral TID  fluticasone  2 spray Each Nare BID   heparin  5,000 Units Subcutaneous Q8H   hydrALAZINE  100 mg Oral Q8H   insulin aspart  0-15 Units Subcutaneous TID WC   isosorbide mononitrate  120 mg Oral Daily   losartan  25 mg Oral BID   sodium chloride flush  3 mL Intravenous Q12H   torsemide  80 mg Oral Daily    sodium chloride     sodium chloride, acetaminophen **OR** acetaminophen, albuterol, guaiFENesin-dextromethorphan, hydrALAZINE, ondansetron **OR** ondansetron (ZOFRAN) IV, polyethylene glycol, sodium chloride flush

## 2022-07-24 NOTE — Progress Notes (Signed)
  Progress Note   Patient: Micheal Levy ZOX:[id number] DOB: 1980-07-31 DOA: 07/20/2022     4 DOS: the patient was seen and examined on 07/24/2022   Brief hospital course: 42 y.o. male with medical history of this is a 42 year old male with chronic kidney disease, diabetes mellitus, hypertension, history of CVA who presented to the ED on 4/14 with cough, found to be pos for metapneumovirus  Assessment and Plan: Bilateral pneumonia- metapneumovirus + - possibly bacterial superinfection - U strep and HIV neg - cont antibiotics, nebs and cough meds - on room air   Active Problems: Sinus pause - ~ 6.5 sec pause on 4/16, prompting beta blocker to be on hold -continue tele, stable thus far   Altered mental status - while in triage and resolved on its own     Hypertensive urgency - Continued on 0.3mg  TID clonidine,  norvasc,  q8h hydralazine,  torsemide -Discussed with Nephrology who initially recommended increasing torsemide to  bid -Volume status improved, thus torsemide dose decreased to  daily -ARB started by Nephrology. Thus far, bp trends seem improved   Mildly elevated troponin - no chest pain consistent with ACS - Possibly due to HTN vs CKD    AKI with CKD (chronic kidney disease) stage 4, GFR 15-29 ml/min - Cr as baseline at ~ 3.5, GFR ~ 15 - cont Torsemide and K replacement - Cr stable at 3.89     Insulin-requiring or dependent type II diabetes mellitus Hold lantus which is 5 U QHS - not on Novolog - SSI ordered   Sesonal allergies - cont Flonase   AOCD, normocytic anemia - Hgb 11.1   Morbid obesity Body mass index is 38.78 kg/m. - may be a candidate for GLP 1 agonist   Subjective: Reports coughing up phlegm this AM  Physical Exam: Vitals:   07/24/22 1207 07/24/22 1314 07/24/22 1421 07/24/22 1514  BP: (!) 180/127 (!) 150/104  (!) 147/85  Pulse: (!) 59 (!) 57    Resp:      Temp:      TempSrc:      SpO2:   94%   Weight:       Height:       General exam: Conversant, in no acute distress Respiratory system: normal chest rise, clear, no audible wheezing Cardiovascular system: regular rhythm, s1-s2 Gastrointestinal system: Nondistended, nontender, pos BS Central nervous system: No seizures, no tremors Extremities: No cyanosis, no joint deformities Skin: No rashes, no pallor Psychiatry: Affect normal // no auditory hallucinations   Data Reviewed:  Labs reviewed: Na 139, K 3.3, Cr 3.89  Family Communication: Pt in room, family not at bedside  Disposition: Status is: Inpatient Remains inpatient appropriate because: Severity of illness  Planned Discharge Destination: Home    Author: Rickey Barbara, MD 07/24/2022 3:22 PM  For on call review [email or web address].

## 2022-07-25 ENCOUNTER — Other Ambulatory Visit (HOSPITAL_COMMUNITY): Payer: Self-pay

## 2022-07-25 ENCOUNTER — Other Ambulatory Visit: Payer: Self-pay

## 2022-07-25 DIAGNOSIS — R410 Disorientation, unspecified: Secondary | ICD-10-CM | POA: Diagnosis not present

## 2022-07-25 DIAGNOSIS — J189 Pneumonia, unspecified organism: Secondary | ICD-10-CM | POA: Diagnosis not present

## 2022-07-25 DIAGNOSIS — I160 Hypertensive urgency: Secondary | ICD-10-CM | POA: Diagnosis not present

## 2022-07-25 LAB — GLUCOSE, CAPILLARY
Glucose-Capillary: 103 mg/dL — ABNORMAL HIGH (ref 70–99)
Glucose-Capillary: 173 mg/dL — ABNORMAL HIGH (ref 70–99)
Glucose-Capillary: 99 mg/dL (ref 70–99)

## 2022-07-25 LAB — COMPREHENSIVE METABOLIC PANEL
ALT: 10 U/L (ref 0–44)
AST: 14 U/L — ABNORMAL LOW (ref 15–41)
Albumin: 2.6 g/dL — ABNORMAL LOW (ref 3.5–5.0)
Alkaline Phosphatase: 47 U/L (ref 38–126)
Anion gap: 9 (ref 5–15)
BUN: 38 mg/dL — ABNORMAL HIGH (ref 6–20)
CO2: 26 mmol/L (ref 22–32)
Calcium: 8.6 mg/dL — ABNORMAL LOW (ref 8.9–10.3)
Chloride: 102 mmol/L (ref 98–111)
Creatinine, Ser: 3.92 mg/dL — ABNORMAL HIGH (ref 0.61–1.24)
GFR, Estimated: 19 mL/min — ABNORMAL LOW (ref >60)
Glucose, Bld: 107 mg/dL — ABNORMAL HIGH (ref 70–99)
Potassium: 3.4 mmol/L — ABNORMAL LOW (ref 3.5–5.1)
Sodium: 137 mmol/L (ref 135–145)
Total Bilirubin: 0.3 mg/dL (ref 0.3–1.2)
Total Protein: 6.9 g/dL (ref 6.5–8.1)

## 2022-07-25 MED ORDER — LOSARTAN POTASSIUM 25 MG PO TABS
50.0000 mg | ORAL_TABLET | Freq: Every day | ORAL | 0 refills | Status: DC
  Filled 2022-07-25: qty 60, 30d supply, fill #0

## 2022-07-25 MED ORDER — TORSEMIDE 20 MG PO TABS
80.0000 mg | ORAL_TABLET | Freq: Every day | ORAL | 0 refills | Status: DC
  Filled 2022-07-25: qty 240, 60d supply, fill #0

## 2022-07-25 MED ORDER — POTASSIUM CHLORIDE CRYS ER 20 MEQ PO TBCR
40.0000 meq | EXTENDED_RELEASE_TABLET | Freq: Once | ORAL | Status: AC
  Administered 2022-07-25: 40 meq via ORAL
  Filled 2022-07-25: qty 2

## 2022-07-25 NOTE — Discharge Summary (Signed)
Physician Discharge Summary   Patient: Micheal Levy MRN: [id number] DOB: Aug 30, 1980  Admit date:     07/20/2022  Discharge date: 07/25/22  Discharge Physician: Rickey Barbara   PCP: Barbette Merino, NP   Recommendations at discharge:    Follow up with PCP in 1-2 weeks Follow up with Cardiology as scheduled Follow up with Nephrology as scheduled  Discharge Diagnoses: Principal Problem:   Bilateral pneumonia Active Problems:   CKD (chronic kidney disease) stage 4, GFR 15-29 ml/min   Insulin-requiring or dependent type II diabetes mellitus   Hypertensive urgency   Altered mental status  Resolved Problems:   * No resolved hospital problems. *  Hospital Course: 42 y.o. male with medical history of this is a 42 year old male with chronic kidney disease, diabetes mellitus, hypertension, history of CVA who presented to the ED on 4/14 with cough, found to be pos for metapneumovirus  Assessment and Plan: Bilateral pneumonia- metapneumovirus + - possibly bacterial superinfection - U strep and HIV neg - cont antibiotics, nebs and cough meds - on room air   Active Problems: Sinus pause - ~ 6.5 sec pause on 4/16, prompting beta blocker to be on hold -continue tele - Remained stable afterwards on tele since stopping beta blocker. Had spoken with Cardiology on call - Recommend f/u with primary Cardiologist on d/c   Altered mental status - while in triage and resolved on its own     Hypertensive urgency - Continued on 0.3mg  TID clonidine, 10mg  norvasc, 100mg  q8h hydralazine, 80mg  torsemide -Torsemide initially increased to 80mg  bid, later dose decreased to 80mg  daily -ARB started by Nephrology. Thus far, bp trends seem improved -Prescribed losartan 50mg  daily per Nephrology recs   Mildly elevated troponin - no chest pain consistent with ACS - Possibly due to HTN vs CKD    AKI with CKD (chronic kidney disease) stage 4, GFR 15-29 ml/min - Cr as baseline at ~ 3.5, GFR ~ 15 -  cont Torsemide and K replacement - Cr remained stable -Had been followed by Nephrology     Insulin-requiring or dependent type II diabetes mellitus Hold lantus which is 5 U QHS - not on Novolog - SSI ordered while in hospital   Sesonal allergies - cont Flonase   AOCD, normocytic anemia - Hgb remained stable   Morbid obesity Body mass index is 38.78 kg/m. - may be a candidate for GLP 1 agonist -would f/u as outpatient     Consultants: Nephrology Procedures performed:   Disposition: Home Diet recommendation:  Cardiac and Carb modified diet DISCHARGE MEDICATION: Allergies as of 07/25/2022   No Known Allergies      Medication List     STOP taking these medications    carvedilol 25 MG tablet Commonly known as: COREG       TAKE these medications    albuterol 108 (90 Base) MCG/ACT inhaler Commonly known as: VENTOLIN HFA Inhale 2 puffs into the lungs every 6 (six) hours as needed for wheezing or shortness of breath.   amLODipine 10 MG tablet Commonly known as: NORVASC Take 10 mg by mouth daily.   aspirin EC 81 MG tablet Take 1 tablet (81 mg total) by mouth daily.   cloNIDine 0.3 MG tablet Commonly known as: CATAPRES Take 1 tablet (0.3 mg total) by mouth 3 (three) times daily.   fluticasone 50 MCG/ACT nasal spray Commonly known as: FLONASE Place 2 sprays into both nostrils 2 (two) times daily. What changed:  when to take this reasons to  take this   hydrALAZINE 100 MG tablet Commonly known as: APRESOLINE Take 1 tablet (100 mg total) by mouth every 8 (eight) hours. What changed: when to take this   INSULIN SYRINGE .5CC/29G 29G X 1/2" 0.5 ML Misc Commonly known as: B-D INS SYR ULTRAFINE .5CC/29G 1 each by Does not apply route at bedtime.   isosorbide mononitrate 120 MG 24 hr tablet Commonly known as: IMDUR Take 1 tablet (120 mg total) by mouth daily.   Lancets Misc 1 each by Does not apply route 4 (four) times daily -  before meals and at  bedtime.   Lantus SoloStar 100 UNIT/ML Solostar Pen Generic drug: insulin glargine Inject 5 Units into the skin daily at 10 pm.   losartan 25 MG tablet Commonly known as: COZAAR Take 2 tablets (50 mg total) by mouth daily.   Multivitamin Adults Tabs Take 1 tablet by mouth daily.   Pen Needles 31G X 6 MM Misc 1 each by Does not apply route at bedtime.   potassium chloride SA 20 MEQ tablet Commonly known as: KLOR-CON M Take 40 mEq by mouth daily.   Torsemide 40 MG Tabs Take 80 mg by mouth daily for 30 doses. What changed:  when to take this Another medication with the same name was removed. Continue taking this medication, and follow the directions you see here.   True Metrix Air Glucose Meter Devi 1 each by Does not apply route 4 (four) times daily -  before meals and at bedtime.   True Metrix Blood Glucose Test test strip Generic drug: glucose blood Use as instructed What changed:  how much to take how to take this when to take this additional instructions        Follow-up Information     Barbette Merino, NP Follow up in 2 week(s).   Specialty: Adult Health Nurse Practitioner Why: Hospital follow up Contact information: [address] [place] [address] [phone]         Maxie Barb, MD Follow up.   Specialties: Nephrology, Internal Medicine Why: Hospital follow up Contact information: [address] [place] [address] [phone]         Thomasene Ripple, DO Follow up.   Specialty: Cardiology Why: Hospital follow up Contact information: [address] [place] [address] [place] [address] [phone]                Discharge Exam: Ceasar Mons Weights   07/20/22 0805 07/20/22 1556 07/25/22 0919  Weight: 117.5 kg 115.7 kg 109.9 kg   General exam: Awake, laying in bed, in nad Respiratory system: Normal respiratory effort, no wheezing Cardiovascular system: regular rate, s1, s2 Gastrointestinal system: Soft, nondistended,  positive BS Central nervous system: CN2-12 grossly intact, strength intact Extremities: Perfused, no clubbing Skin: Normal skin turgor, no notable skin lesions seen Psychiatry: Mood normal // no visual hallucinations   Condition at discharge: fair  The results of significant diagnostics from this hospitalization (including imaging, microbiology, ancillary and laboratory) are listed below for reference.   Imaging Studies: DG Chest 2 View  Result Date: 07/24/2022 CLINICAL DATA:  Follow-up. EXAM: CHEST - 2 VIEW COMPARISON:  Chest radiographs 07/20/2022 FINDINGS: The cardiac silhouette remains enlarged. Bilateral interstitial and hazy airspace opacities have improved but not resolved. No pleural effusion or pneumothorax is identified. No acute osseous abnormality is seen. IMPRESSION: Decreased bilateral lung opacities which may reflect improving edema or infection. Electronically Signed   By: Sebastian Ache M.D.   On:  07/24/2022 10:53   CT Head Wo Contrast  Result Date: 07/20/2022 CLINICAL DATA:  Hypertension EXAM: CT HEAD WITHOUT CONTRAST TECHNIQUE: Contiguous axial images were obtained from the base of the skull through the vertex without intravenous contrast. RADIATION DOSE REDUCTION: This exam was performed according to the departmental dose-optimization program which includes automated exposure control, adjustment of the mA and/or kV according to patient size and/or use of iterative reconstruction technique. COMPARISON:  09/04/2019 FINDINGS: Brain: No evidence of acute infarction, hemorrhage, hydrocephalus, or extra-axial collection. Stable arachnoid cyst in the right middle cranial fossa. No new mass lesion. Scattered low-density changes within the periventricular and subcortical white matter most compatible with chronic microvascular ischemic change. Mild diffuse cerebral volume loss. Vascular: No hyperdense vessel or unexpected calcification. Skull: Normal. Negative for fracture or focal lesion.  Sinuses/Orbits: No acute finding. Other: None. IMPRESSION: 1. No acute intracranial abnormality. 2. Stable chronic microvascular ischemic change and cerebral volume loss. 3. Stable arachnoid cyst in the right middle cranial fossa. Electronically Signed   By: Duanne Guess D.O.   On: 07/20/2022 10:10   DG Chest 2 View  Result Date: 07/20/2022 CLINICAL DATA:  Cough for 1 week. EXAM: CHEST - 2 VIEW COMPARISON:  Chest x-rays dated 07/20/2022 in 05/28/2022. FINDINGS: Continued bilateral diffuse interstitial and alveolar opacities, RIGHT slightly greater than LEFT. Stable cardiomegaly. No pleural effusion or pneumothorax is seen. Osseous structures about the chest are unremarkable. IMPRESSION: Continued bilateral diffuse interstitial and alveolar opacities, RIGHT slightly greater than LEFT, compatible with multifocal pneumonia and/or pulmonary edema. Electronically Signed   By: Bary Richard M.D.   On: 07/20/2022 09:32   DG Chest 2 View  Result Date: 07/20/2022 CLINICAL DATA:  Cough, congestion EXAM: CHEST - 2 VIEW COMPARISON:  05/28/2022 FINDINGS: Multifocal patchy airspace opacities, right lung predominant, favoring multifocal infection over asymmetric interstitial edema. No definite pleural effusions. No pneumothorax. Cardiomegaly. Mild degenerative changes of the visualized thoracolumbar spine. IMPRESSION: Multifocal patchy airspace opacities, right lung predominant, favoring multifocal infection over asymmetric interstitial edema. Electronically Signed   By: Charline Bills M.D.   On: 07/20/2022 01:12    Microbiology: Results for orders placed or performed during the hospital encounter of 07/20/22  Respiratory (~20 pathogens) panel by PCR     Status: Abnormal   Collection Time: 07/20/22  5:57 PM   Specimen: Nasopharyngeal Swab; Respiratory  Result Value Ref Range Status   Adenovirus NOT DETECTED NOT DETECTED Final   Coronavirus 229E NOT DETECTED NOT DETECTED Final    Comment: (NOTE) The  Coronavirus on the Respiratory Panel, DOES NOT test for the novel  Coronavirus (2019 nCoV)    Coronavirus HKU1 NOT DETECTED NOT DETECTED Final   Coronavirus NL63 NOT DETECTED NOT DETECTED Final   Coronavirus OC43 NOT DETECTED NOT DETECTED Final   Metapneumovirus DETECTED (A) NOT DETECTED Final   Rhinovirus / Enterovirus NOT DETECTED NOT DETECTED Final   Influenza A NOT DETECTED NOT DETECTED Final   Influenza B NOT DETECTED NOT DETECTED Final   Parainfluenza Virus 1 NOT DETECTED NOT DETECTED Final   Parainfluenza Virus 2 NOT DETECTED NOT DETECTED Final   Parainfluenza Virus 3 NOT DETECTED NOT DETECTED Final   Parainfluenza Virus 4 NOT DETECTED NOT DETECTED Final   Respiratory Syncytial Virus NOT DETECTED NOT DETECTED Final   Bordetella pertussis NOT DETECTED NOT DETECTED Final   Bordetella Parapertussis NOT DETECTED NOT DETECTED Final   Chlamydophila pneumoniae NOT DETECTED NOT DETECTED Final   Mycoplasma pneumoniae NOT DETECTED NOT DETECTED Final  Comment: Performed at [facility], [address] [place], [place] [address]    Labs: CBC: Recent Labs  Lab 07/20/22 [phone] 07/20/22 1605 07/21/22 0535 07/24/22 0549  WBC 7.6 8.9 8.6 6.0  NEUTROABS 4.6  --   --   --   HGB 12.0* 11.5* 11.2* 11.1*  HCT 38.3* 37.1* 36.0* 36.7*  MCV 84.4 84.5 83.7 85.3  PLT 342 336 385 495*   Basic Metabolic Panel: Recent Labs  Lab 07/20/22 0918 07/20/22 1605 07/21/22 0535 07/21/22 0635 07/22/22 0601 07/23/22 0540 07/24/22 0549 07/25/22 0513  NA 136  --  134*  --  137 138 139 137  K 4.1  --  3.0*  --  3.6 3.6 3.3* 3.4*  CL 101  --  100  --  103 104 103 102  CO2 26  --  24  --  GLUCOSE 136*  --  144*  --  125* 126* 103* 107*  BUN 54*  --  48*  --  42* 39* 39* 38*  CREATININE 4.58*   < > 4.18*  --  3.90* 3.77* 3.89* 3.92*  CALCIUM 8.0*  --  7.9*  --  8.4* 8.6* 8.6* 8.6*  MG 2.4  --   --  2.3 2.2  --  2.1  --    < > = values in this interval not displayed.   Liver  Function Tests: Recent Labs  Lab 07/20/22 0918 07/24/22 0549 07/25/22 0513  AST 25 12* 14*  ALT ALKPHOS 52 47 47  BILITOT 0.6 0.6 0.3  PROT 7.2 6.8 6.9  ALBUMIN 2.8* 2.6* 2.6*   CBG: Recent Labs  Lab 07/24/22 1128 07/24/22 1646 07/24/22 2212 07/25/22 0732 07/25/22 1118  GLUCAP 154* 161* 87 103* 173*    Discharge time spent: less than 30 minutes.  Signed: Rickey Barbara, MD [facility] 07/25/2022

## 2022-07-25 NOTE — Progress Notes (Signed)
[facility] Progress Note  Subjective: pt seen in room, has no HA, CP , SOB or leg swelling. I took his BP and got 165/ 99 late morning today.   Vitals:   07/25/22 0916 07/25/22 0919 07/25/22 1132 07/25/22 1249  BP: (!) 211/111  (!) 165/102 (!) 170/107  Pulse:    77  Resp:    19  Temp:    98 F (36.7 C)  TempSrc:    Oral  SpO2:    96%  Weight:  109.9 kg    Height:        Exam: Gen alert, no distress, on room air No jvd or bruits Chest clear bilat to bases RRR no MRG Abd soft ntnd no mass or ascites +bs Ext no LE or UE edema Neuro is alert, Ox 3 , nf        Home meds include - albuterol, insulin glargine, mVI, klorcaon, demadex 80 qam, norvasc 10, asa, coreg 25 bid, catapres 0.3 tid, hydralazine 100 tid, imdur 120 qd, prns     Date                          Creat               eGFR    2017                         1.23    2018                         1.30- 2.86    2019                         1.22- 1.30    2021                         1.59- 2.75    2022                         1.65- 2.92    Jan 2023                  1.80- 2.73    2/01- 05/11/21            3.03- 3.35    2/11- 06/08/21            1.54- 4.15    06/24/21                     2.45    Sept 2023                 2.41- 2.76       28- 33 ml/min    2/15- 05/30/22             3.40- 4.61       15- 22 ml/min    4/14                           4.58                15 ml/min    4/15                           4.18  4/16                           3.90                19    4/17                           3.77    4/18                           3.89                19 ml/miin         Assessment/ Plan: Uncontrolled HTN - we restarted his losartan yesterday which was held last admit in Feb 2024 due to rising creat while aggressively diuresing. Coreg was stopped here due to a 6 second pause. Continues on norvasc, catapres and hydralazine as well. BP's were not better overnight, but I think his BP's are starting to  improve this morning. I think he is close to discharge, will d/w pmd.  CKD 4 - creat stable in b/l range of 3.4- 4.6, from feb 2024, eGFR 15-22 ml/min. No uremic signs or symptoms. Pt is pleasant and does not appear acutely ill.  Volume - suprisingly no vol overload like on prior admissions. CXR changes are more likely due to MPV acute viral infection. His exam doesn't fit w/ vol overload, looks euvolemic. Continue demadex at 80 mg po qam.  MPV infection - w/ bilat viral pna most likely by CXR. Overall he appears to be improving, is up in the room, walking about.  DM2     Rob Arlean Hopping MD [facility] 07/25/2022, 2:33 PM  Recent Labs  Lab 07/21/22 0535 07/22/22 0601 07/24/22 0549 07/25/22 0513  HGB 11.2*  --  11.1*  --   ALBUMIN  --   --  2.6* 2.6*  CALCIUM 7.9*   < > 8.6* 8.6*  CREATININE 4.18*   < > 3.89* 3.92*  K 3.0*   < > 3.3* 3.4*   < > = values in this interval not displayed.   No results for input(s): "IRON", "TIBC", "FERRITIN" in the last 168 hours. Inpatient medications:  albuterol  2.5 mg Nebulization TID   amLODipine  10 mg Oral Daily   aspirin EC  81 mg Oral Daily   cloNIDine  0.3 mg Oral TID   fluticasone  2 spray Each Nare BID   heparin  5,000 Units Subcutaneous Q8H   hydrALAZINE  100 mg Oral Q8H   insulin aspart  0-15 Units Subcutaneous TID WC   isosorbide mononitrate  120 mg Oral Daily   losartan  25 mg Oral BID   sodium chloride flush  3 mL Intravenous Q12H   torsemide  80 mg Oral Daily    sodium chloride     sodium chloride, acetaminophen **OR** acetaminophen, albuterol, guaiFENesin-dextromethorphan, hydrALAZINE, ondansetron **OR** ondansetron (ZOFRAN) IV, polyethylene glycol, sodium chloride flush

## 2022-07-28 ENCOUNTER — Other Ambulatory Visit (HOSPITAL_COMMUNITY): Payer: Self-pay

## 2022-07-29 ENCOUNTER — Telehealth: Payer: Self-pay

## 2022-07-29 NOTE — Transitions of Care (Post Inpatient/ED Visit) (Signed)
   07/29/2022  Name: Micheal Levy MRN: [id number] DOB: 1980-06-22  Today's TOC FU Call Status: Today's TOC FU Call Status:: Successful TOC FU Call Competed TOC FU Call Complete Date: 07/29/22  Transition Care Management Follow-up Telephone Call Date of Discharge: 07/25/22 Discharge Facility: [facility]) Type of Discharge: Inpatient Admission Primary Inpatient Discharge Diagnosis:: Bilateral pneumonia How have you been since you were released from the hospital?: Better Any questions or concerns?: No  Items Reviewed: Did you receive and understand the discharge instructions provided?: Yes Medications obtained and verified?: Yes (Medications Reviewed) Any new allergies since your discharge?: No Dietary orders reviewed?: NA Do you have support at home?: Yes People in Home: significant other  Home Care and Equipment/Supplies: Were Home Health Services Ordered?: NA Any new equipment or medical supplies ordered?: NA  Functional Questionnaire: Do you need assistance with bathing/showering or dressing?: No Do you need assistance with meal preparation?: No Do you need assistance with eating?: No Do you have difficulty maintaining continence: No Do you need assistance with getting out of bed/getting out of a chair/moving?: No Do you have difficulty managing or taking your medications?: No  Follow up appointments reviewed: PCP Follow-up appointment confirmed?: No (pt is aware I am reaching out to PCP for appointment, and office will call him back with appt. (he has not been seen recently and need to check with office before scheduling)) MD Provider Line Number:[phone] Given: Yes Follow-up Provider: Thad Ranger, NP Specialist Hospital Follow-up appointment confirmed?: Yes Date of Specialist follow-up appointment?:  (wife scheduled appt and pt is uncertain of date) Follow-Up Specialty Provider:: Thomasene Ripple, DO-Cardiology Do you need transportation to your follow-up  appointment?: No Do you understand care options if your condition(s) worsen?: Yes-patient verbalized understanding    SIGNATURE Agnes Lawrence, CMA (AAMA)  [facility] [phone]

## 2022-08-01 ENCOUNTER — Other Ambulatory Visit (HOSPITAL_COMMUNITY): Payer: Self-pay

## 2022-09-13 ENCOUNTER — Inpatient Hospital Stay (HOSPITAL_COMMUNITY)
Admission: EM | Admit: 2022-09-13 | Discharge: 2022-09-20 | DRG: 304 | Disposition: A | Discharge status: Home / Self Care | Payer: Medicaid Other | Attending: Internal Medicine | Admitting: Internal Medicine

## 2022-09-13 ENCOUNTER — Encounter (HOSPITAL_COMMUNITY): Payer: Self-pay

## 2022-09-13 ENCOUNTER — Other Ambulatory Visit: Payer: Self-pay

## 2022-09-13 ENCOUNTER — Emergency Department (HOSPITAL_COMMUNITY): Payer: Medicaid Other

## 2022-09-13 DIAGNOSIS — E1159 Type 2 diabetes mellitus with other circulatory complications: Secondary | ICD-10-CM | POA: Diagnosis not present

## 2022-09-13 DIAGNOSIS — I130 Hypertensive heart and chronic kidney disease with heart failure and stage 1 through stage 4 chronic kidney disease, or unspecified chronic kidney disease: Secondary | ICD-10-CM | POA: Diagnosis present

## 2022-09-13 DIAGNOSIS — Z8249 Family history of ischemic heart disease and other diseases of the circulatory system: Secondary | ICD-10-CM

## 2022-09-13 DIAGNOSIS — I152 Hypertension secondary to endocrine disorders: Secondary | ICD-10-CM | POA: Diagnosis present

## 2022-09-13 DIAGNOSIS — R7989 Other specified abnormal findings of blood chemistry: Secondary | ICD-10-CM | POA: Diagnosis present

## 2022-09-13 DIAGNOSIS — Z1152 Encounter for screening for COVID-19: Secondary | ICD-10-CM

## 2022-09-13 DIAGNOSIS — E1122 Type 2 diabetes mellitus with diabetic chronic kidney disease: Secondary | ICD-10-CM | POA: Diagnosis present

## 2022-09-13 DIAGNOSIS — I161 Hypertensive emergency: Principal | ICD-10-CM | POA: Diagnosis present

## 2022-09-13 DIAGNOSIS — Z7982 Long term (current) use of aspirin: Secondary | ICD-10-CM

## 2022-09-13 DIAGNOSIS — I10 Essential (primary) hypertension: Secondary | ICD-10-CM | POA: Diagnosis present

## 2022-09-13 DIAGNOSIS — Z79899 Other long term (current) drug therapy: Secondary | ICD-10-CM

## 2022-09-13 DIAGNOSIS — E119 Type 2 diabetes mellitus without complications: Secondary | ICD-10-CM

## 2022-09-13 DIAGNOSIS — Z833 Family history of diabetes mellitus: Secondary | ICD-10-CM

## 2022-09-13 DIAGNOSIS — N179 Acute kidney failure, unspecified: Secondary | ICD-10-CM | POA: Diagnosis present

## 2022-09-13 DIAGNOSIS — Z87891 Personal history of nicotine dependence: Secondary | ICD-10-CM

## 2022-09-13 DIAGNOSIS — I5033 Acute on chronic diastolic (congestive) heart failure: Secondary | ICD-10-CM | POA: Diagnosis present

## 2022-09-13 DIAGNOSIS — E1169 Type 2 diabetes mellitus with other specified complication: Secondary | ICD-10-CM | POA: Diagnosis present

## 2022-09-13 DIAGNOSIS — D631 Anemia in chronic kidney disease: Secondary | ICD-10-CM | POA: Diagnosis present

## 2022-09-13 DIAGNOSIS — I5031 Acute diastolic (congestive) heart failure: Secondary | ICD-10-CM | POA: Diagnosis present

## 2022-09-13 DIAGNOSIS — N184 Chronic kidney disease, stage 4 (severe): Secondary | ICD-10-CM | POA: Diagnosis not present

## 2022-09-13 DIAGNOSIS — Z794 Long term (current) use of insulin: Secondary | ICD-10-CM

## 2022-09-13 DIAGNOSIS — E876 Hypokalemia: Secondary | ICD-10-CM | POA: Diagnosis present

## 2022-09-13 DIAGNOSIS — Z8673 Personal history of transient ischemic attack (TIA), and cerebral infarction without residual deficits: Secondary | ICD-10-CM

## 2022-09-13 DIAGNOSIS — I2489 Other forms of acute ischemic heart disease: Secondary | ICD-10-CM | POA: Diagnosis present

## 2022-09-13 DIAGNOSIS — Z6841 Body Mass Index (BMI) 40.0 and over, adult: Secondary | ICD-10-CM

## 2022-09-13 DIAGNOSIS — D6489 Other specified anemias: Secondary | ICD-10-CM | POA: Diagnosis present

## 2022-09-13 LAB — CK: Total CK: 302 U/L (ref 49–397)

## 2022-09-13 LAB — CBC
HCT: 36.1 % — ABNORMAL LOW (ref 39.0–52.0)
Hemoglobin: 11.2 g/dL — ABNORMAL LOW (ref 13.0–17.0)
MCH: 26.7 pg (ref 26.0–34.0)
MCHC: 31 g/dL (ref 30.0–36.0)
MCV: 86 fL (ref 80.0–100.0)
Platelets: 259 10*3/uL (ref 150–400)
RBC: 4.2 MIL/uL — ABNORMAL LOW (ref 4.22–5.81)
RDW: 17.5 % — ABNORMAL HIGH (ref 11.5–15.5)
WBC: 6.3 10*3/uL (ref 4.0–10.5)
nRBC: 0 % (ref 0.0–0.2)

## 2022-09-13 LAB — BASIC METABOLIC PANEL
Anion gap: 11 (ref 5–15)
BUN: 34 mg/dL — ABNORMAL HIGH (ref 6–20)
CO2: 20 mmol/L — ABNORMAL LOW (ref 22–32)
Calcium: 8.3 mg/dL — ABNORMAL LOW (ref 8.9–10.3)
Chloride: 107 mmol/L (ref 98–111)
Creatinine, Ser: 4.45 mg/dL — ABNORMAL HIGH (ref 0.61–1.24)
GFR, Estimated: 16 mL/min — ABNORMAL LOW (ref >60)
Glucose, Bld: 141 mg/dL — ABNORMAL HIGH (ref 70–99)
Potassium: 4 mmol/L (ref 3.5–5.1)
Sodium: 138 mmol/L (ref 135–145)

## 2022-09-13 LAB — TROPONIN I (HIGH SENSITIVITY): Troponin I (High Sensitivity): 43 ng/L — ABNORMAL HIGH (ref <18)

## 2022-09-13 LAB — BRAIN NATRIURETIC PEPTIDE: B Natriuretic Peptide: 1149.8 pg/mL — ABNORMAL HIGH (ref 0.0–100.0)

## 2022-09-13 LAB — PHOSPHORUS: Phosphorus: 4 mg/dL (ref 2.5–4.6)

## 2022-09-13 LAB — MAGNESIUM: Magnesium: 2.3 mg/dL (ref 1.7–2.4)

## 2022-09-13 MED ORDER — ALBUTEROL SULFATE (2.5 MG/3ML) 0.083% IN NEBU: 2.5000 mg | INHALATION_SOLUTION | Freq: Four times a day (QID) | RESPIRATORY_TRACT | Status: DC | PRN

## 2022-09-13 MED ORDER — INSULIN ASPART 100 UNIT/ML IJ SOLN
0.0000 [IU] | Freq: Every day | INTRAMUSCULAR | Status: DC
  Filled 2022-09-13: qty 0.05

## 2022-09-13 MED ORDER — ACETAMINOPHEN 650 MG RE SUPP: 650.0000 mg | Freq: Four times a day (QID) | RECTAL | Status: DC | PRN

## 2022-09-13 MED ORDER — ONDANSETRON HCL 4 MG PO TABS: 4.0000 mg | ORAL_TABLET | Freq: Four times a day (QID) | ORAL | Status: DC | PRN

## 2022-09-13 MED ORDER — SODIUM CHLORIDE 0.9% FLUSH: 3.0000 mL | INTRAVENOUS | Status: DC | PRN

## 2022-09-13 MED ORDER — FUROSEMIDE 10 MG/ML IJ SOLN
INTRAMUSCULAR | Status: AC
  Filled 2022-09-13: qty 4

## 2022-09-13 MED ORDER — ONDANSETRON HCL 4 MG/2ML IJ SOLN: 4.0000 mg | Freq: Four times a day (QID) | INTRAMUSCULAR | Status: DC | PRN

## 2022-09-13 MED ORDER — CARVEDILOL 25 MG PO TABS
25.0000 mg | ORAL_TABLET | Freq: Two times a day (BID) | ORAL | Status: DC
  Administered 2022-09-14 – 2022-09-20 (×14): 25 mg via ORAL
  Filled 2022-09-13 (×4): qty 1
  Filled 2022-09-13: qty 2
  Filled 2022-09-13: qty 1
  Filled 2022-09-13: qty 2
  Filled 2022-09-13 (×2): qty 1
  Filled 2022-09-13 (×2): qty 2
  Filled 2022-09-13: qty 1
  Filled 2022-09-13 (×2): qty 2

## 2022-09-13 MED ORDER — HYDRALAZINE HCL 50 MG PO TABS
100.0000 mg | ORAL_TABLET | Freq: Three times a day (TID) | ORAL | Status: DC
  Administered 2022-09-14 – 2022-09-18 (×14): 100 mg via ORAL
  Filled 2022-09-13 (×15): qty 2

## 2022-09-13 MED ORDER — HYDRALAZINE HCL 20 MG/ML IJ SOLN
10.0000 mg | Freq: Once | INTRAMUSCULAR | Status: AC
  Administered 2022-09-13: 10 mg via INTRAVENOUS
  Filled 2022-09-13: qty 1

## 2022-09-13 MED ORDER — SODIUM CHLORIDE 0.9 % IV SOLN: 250.0000 mL | INTRAVENOUS | Status: DC | PRN

## 2022-09-13 MED ORDER — ALBUTEROL SULFATE HFA 108 (90 BASE) MCG/ACT IN AERS: 2.0000 | INHALATION_SPRAY | Freq: Four times a day (QID) | RESPIRATORY_TRACT | Status: DC | PRN

## 2022-09-13 MED ORDER — FUROSEMIDE 10 MG/ML IJ SOLN
80.0000 mg | Freq: Once | INTRAMUSCULAR | Status: AC
  Administered 2022-09-13: 80 mg via INTRAVENOUS
  Filled 2022-09-13: qty 8

## 2022-09-13 MED ORDER — SODIUM CHLORIDE 0.9% FLUSH
3.0000 mL | Freq: Two times a day (BID) | INTRAVENOUS | Status: DC
  Administered 2022-09-14 – 2022-09-20 (×14): 3 mL via INTRAVENOUS

## 2022-09-13 MED ORDER — INSULIN GLARGINE-YFGN 100 UNIT/ML ~~LOC~~ SOLN
5.0000 [IU] | Freq: Every day | SUBCUTANEOUS | Status: DC
  Administered 2022-09-14 – 2022-09-19 (×7): 5 [IU] via SUBCUTANEOUS
  Filled 2022-09-13 (×8): qty 0.05

## 2022-09-13 MED ORDER — ASPIRIN 81 MG PO TBEC
81.0000 mg | DELAYED_RELEASE_TABLET | Freq: Every day | ORAL | Status: DC
  Administered 2022-09-14 – 2022-09-20 (×7): 81 mg via ORAL
  Filled 2022-09-13 (×7): qty 1

## 2022-09-13 MED ORDER — FUROSEMIDE 10 MG/ML IJ SOLN
80.0000 mg | Freq: Two times a day (BID) | INTRAMUSCULAR | Status: DC
  Administered 2022-09-14 – 2022-09-15 (×3): 80 mg via INTRAVENOUS
  Filled 2022-09-13 (×3): qty 8

## 2022-09-13 MED ORDER — CLONIDINE HCL 0.2 MG PO TABS
0.3000 mg | ORAL_TABLET | Freq: Three times a day (TID) | ORAL | Status: DC
  Administered 2022-09-14 – 2022-09-20 (×20): 0.3 mg via ORAL
  Filled 2022-09-13 (×9): qty 1
  Filled 2022-09-13: qty 3
  Filled 2022-09-13 (×10): qty 1

## 2022-09-13 MED ORDER — AMLODIPINE BESYLATE 5 MG PO TABS
10.0000 mg | ORAL_TABLET | Freq: Every day | ORAL | Status: DC
  Administered 2022-09-14 – 2022-09-20 (×7): 10 mg via ORAL
  Filled 2022-09-13: qty 1
  Filled 2022-09-13 (×2): qty 2
  Filled 2022-09-13: qty 1
  Filled 2022-09-13: qty 2
  Filled 2022-09-13: qty 1
  Filled 2022-09-13: qty 2

## 2022-09-13 MED ORDER — LABETALOL HCL 5 MG/ML IV SOLN
10.0000 mg | Freq: Once | INTRAVENOUS | Status: AC
  Administered 2022-09-14: 10 mg via INTRAVENOUS
  Filled 2022-09-13: qty 4

## 2022-09-13 MED ORDER — ACETAMINOPHEN 325 MG PO TABS: 650.0000 mg | ORAL_TABLET | Freq: Four times a day (QID) | ORAL | Status: DC | PRN

## 2022-09-13 MED ORDER — HYDROCODONE-ACETAMINOPHEN 5-325 MG PO TABS: 1.0000 | ORAL_TABLET | ORAL | Status: DC | PRN

## 2022-09-13 MED ORDER — ISOSORBIDE MONONITRATE ER 60 MG PO TB24
120.0000 mg | ORAL_TABLET | Freq: Every day | ORAL | Status: DC
  Administered 2022-09-14 – 2022-09-20 (×7): 120 mg via ORAL
  Filled 2022-09-13 (×8): qty 2

## 2022-09-13 MED ORDER — INSULIN ASPART 100 UNIT/ML IJ SOLN
0.0000 [IU] | Freq: Three times a day (TID) | INTRAMUSCULAR | Status: DC
  Administered 2022-09-14 – 2022-09-15 (×5): 1 [IU] via SUBCUTANEOUS
  Administered 2022-09-16: 2 [IU] via SUBCUTANEOUS
  Administered 2022-09-16 – 2022-09-17 (×4): 1 [IU] via SUBCUTANEOUS
  Administered 2022-09-17: 2 [IU] via SUBCUTANEOUS
  Administered 2022-09-18: 1 [IU] via SUBCUTANEOUS
  Administered 2022-09-18: 3 [IU] via SUBCUTANEOUS
  Administered 2022-09-19 (×2): 1 [IU] via SUBCUTANEOUS
  Administered 2022-09-19: 2 [IU] via SUBCUTANEOUS
  Filled 2022-09-13: qty 0.09

## 2022-09-13 NOTE — Assessment & Plan Note (Signed)
Order sliding scale order Lantus 5 units nightly

## 2022-09-13 NOTE — ED Triage Notes (Signed)
Pt. Arrives POV c/o leg swelling and SOB. Pt. States that this happens every couple of months. He says that he is taking his home diuretics, and BP meds, but it isn't helping.

## 2022-09-13 NOTE — Assessment & Plan Note (Signed)
Restart home meds IV labetalol Avoid lowered bp more than 20 % initially  If no improvement may need nitro drip initiation  Cont to cycle CE

## 2022-09-13 NOTE — H&P (Signed)
Micheal Levy:[id number] DOB: Sep 02, 1980 DOA: 09/13/2022     PCP: Barbette Merino, NP   Outpatient Specialists:   CARDS:  Dr. Thomasene Ripple, DO   Patient arrived to ER on 09/13/22 at 2015 Referred by Attending Charlynne Pander, MD   Patient coming from:    home Lives  With SO    Chief Complaint:   Chief Complaint  Patient presents with   Leg Swelling    HPI: Micheal Levy is a 42 y.o. male with medical history significant of DM2, CHF , HTN CKD stage 4, hx of CVA    Presented with leg edema and shortness of breath of laying down Pt presents with leg edema  States she ahs been compliant with diuretics and BP meds Has been more short of breath especially when she lays down for the past 1 week and notes increased leg swelling no fevers or chills no cough similar to prior CHF exacerbation has been taking her torsemide at home no history of blood clots    Admitted in April 2024 for Bilateral pneumonia due to  metapneumovirus  Reports has been taking his medications no change in diet patient cooks for himself and never adds salt he states he has had these episodes about every 3 months he is unsure what causes it  Denies significant ETOH intake   Does not smoke   Lab Results  Component Value Date   SARSCOV2NAA NEGATIVE 07/20/2022   SARSCOV2NAA NEGATIVE 05/18/2021   SARSCOV2NAA NEGATIVE 05/08/2021   SARSCOV2NAA NEGATIVE 02/19/2021       Regarding pertinent Chronic problems:        HTN on Norvasc, clonidine, hydralazine, isosorbide, losartan, torsemide   chronic CHF diastolic/systolic/ combined - last echo  Recent Results (from the past 81191 hour(s))  ECHOCARDIOGRAM COMPLETE   Collection Time: 05/18/21  8:33 AM  Result Value   Weight 4,239.89   Height 68   BP 147/85   Single Plane A4C EF 71.6   S' Lateral 3.40   Area-P 1/2 4.15   Narrative      ECHOCARDIOGRAM REPORT       1. Left ventricular ejection fraction, by estimation, is 70 to 75%. The left  ventricle has hyperdynamic function. The left ventricle has no regional wall motion abnormalities. There is mild concentric left ventricular hypertrophy. Left ventricular  diastolic parameters are indeterminate. Elevated left ventricular end-diastolic pressure. The average left ventricular global longitudinal strain is -24.8 %. The global longitudinal strain is normal.  2. Right ventricular systolic function is normal. The right ventricular size is mildly enlarged. There is moderately elevated pulmonary artery systolic pressure. The estimated right ventricular systolic pressure is 45.2 mmHg.  3. The mitral valve is normal in structure. No evidence of mitral valve regurgitation. No evidence of mitral stenosis.  4. The aortic valve is normal in structure. Aortic valve regurgitation is not visualized. No aortic stenosis is present.  5. Aortic dilatation noted. There is mild dilatation of the ascending aorta, measuring 38 mm.  6. The inferior vena cava is dilated in size with <50% respiratory variability, suggesting right atrial pressure of 15 mmHg.        DM 2 -  Lab Results  Component Value Date   HGBA1C 8.3 (H) 12/23/2021   on insulin       obesity-   BMI Readings from Last 1 Encounters:  07/25/22 36.84 kg/m       Hx of CVA - *with/out residual deficits on Aspirin  81 mg,       CKD stage IV- baseline Cr 4.0 CrCl cannot be calculated (Unknown ideal weight.).  Lab Results  Component Value Date   CREATININE 4.45 (H) 09/13/2022   CREATININE 3.92 (H) 07/25/2022   CREATININE 3.89 (H) 07/24/2022    Chronic anemia - baseline hg Hemoglobin & Hematocrit  Recent Labs    07/21/22 0535 07/24/22 0549 09/13/22 2054  HGB 11.2* 11.1* 11.2*     While in ER:  CXR showing CHF     Lab Orders         Basic metabolic panel         CBC         Brain natriuretic peptide    CXR -  1. Cardiomegaly with pulmonary vascular congestion. 2. Diffuse hazy airspace opacities bilaterally suggesting  edema.    Following Medications were ordered in ER: Medications  furosemide (LASIX) 10 MG/ML injection (has no administration in time range)  furosemide (LASIX) injection 80 mg (80 mg Intravenous Given 09/13/22 2121)  hydrALAZINE (APRESOLINE) injection 10 mg (10 mg Intravenous Given 09/13/22 2121)     ED Triage Vitals  Enc Vitals Group     BP 09/13/22 2030 (!) 240/163     Pulse Rate 09/13/22 2030 81     Resp 09/13/22 2030 (!) 21     Temp 09/13/22 2030 97.8 F (36.6 C)     Temp Source 09/13/22 2030 Oral     SpO2 09/13/22 2030 96 %     Weight --      Height --      Head Circumference --      Peak Flow --      Pain Score 09/13/22 2032 0     Pain Loc --      Pain Edu? --      Excl. in GC? --   TMAX(24)@     _________________________________________ Significant initial  Findings: Abnormal Labs Reviewed  BASIC METABOLIC PANEL - Abnormal; Notable for the following components:      Result Value   CO2 20 (*)    Glucose, Bld 141 (*)    BUN 34 (*)    Creatinine, Ser 4.45 (*)    Calcium 8.3 (*)    GFR, Estimated 16 (*)    All other components within normal limits  CBC - Abnormal; Notable for the following components:   RBC 4.20 (*)    Hemoglobin 11.2 (*)    HCT 36.1 (*)    RDW 17.5 (*)    All other components within normal limits  BRAIN NATRIURETIC PEPTIDE - Abnormal; Notable for the following components:   B Natriuretic Peptide 1,149.8 (*)    All other components within normal limits  TROPONIN I (HIGH SENSITIVITY) - Abnormal; Notable for the following components:   Troponin I (High Sensitivity) 43 (*)    All other components within normal limits      _________________________ Troponin  ordered Cardiac Panel (last 3 results) Recent Labs    09/13/22 2054  TROPONINIHS 43*    ECG: Ordered Personally reviewed and interpreted by me showing: HR : 76 Rhythm:Sinus rhythm Probable left atrial enlargement Nonspecific T abnormalities, lateral leads Borderline prolonged QT  interval QTC 478  BNP (last 3 results) Recent Labs    05/22/22 2217 07/20/22 0918 09/13/22 2054  BNP 613.1* 313.3* 1,149.8*     The recent clinical data is shown below. Vitals:   09/13/22 2030 09/13/22 2155  BP: (!) 240/163 (!) 184/134  Pulse:  81 81  Resp: (!) 21 (!) 21  Temp: 97.8 F (36.6 C)   TempSrc: Oral   SpO2: 96% 98%    WBC     Component Value Date/Time   WBC 6.3 09/13/2022 2054   LYMPHSABS 2.0 07/20/2022 0918   LYMPHSABS 2.3 11/11/2019 1047   MONOABS 0.7 07/20/2022 0918   EOSABS 0.1 07/20/2022 0918   EOSABS 1.1 (H) 11/11/2019 1047   BASOSABS 0.1 07/20/2022 0918   BASOSABS 0.1 11/11/2019 1047    UA ordered   Results for orders placed or performed during the hospital encounter of 07/20/22  Respiratory (~20 pathogens) panel by PCR     Status: Abnormal   Collection Time: 07/20/22  5:57 PM   Specimen: Nasopharyngeal Swab; Respiratory  Result Value Ref Range Status   Adenovirus NOT DETECTED NOT DETECTED Final   Coronavirus 229E NOT DETECTED NOT DETECTED Final    Comment: (NOTE) The Coronavirus on the Respiratory Panel, DOES NOT test for the novel  Coronavirus (2019 nCoV)    Coronavirus HKU1 NOT DETECTED NOT DETECTED Final   Coronavirus NL63 NOT DETECTED NOT DETECTED Final   Coronavirus OC43 NOT DETECTED NOT DETECTED Final   Metapneumovirus DETECTED (A) NOT DETECTED Final   Rhinovirus / Enterovirus NOT DETECTED NOT DETECTED Final   Influenza A NOT DETECTED NOT DETECTED Final   Influenza B NOT DETECTED NOT DETECTED Final   Parainfluenza Virus 1 NOT DETECTED NOT DETECTED Final   Parainfluenza Virus 2 NOT DETECTED NOT DETECTED Final   Parainfluenza Virus 3 NOT DETECTED NOT DETECTED Final   Parainfluenza Virus 4 NOT DETECTED NOT DETECTED Final   Respiratory Syncytial Virus NOT DETECTED NOT DETECTED Final   Bordetella pertussis NOT DETECTED NOT DETECTED Final   Bordetella Parapertussis NOT DETECTED NOT DETECTED Final   Chlamydophila pneumoniae NOT DETECTED  NOT DETECTED Final   Mycoplasma pneumoniae NOT DETECTED NOT DETECTED Final    Comment: Performed at [facility], [address], [place], [place] [address]    VBG ordered ____________________________________________________________ Recent Labs  Lab 09/13/22 2054  NA 138  K 4.0  CO2 20*  GLUCOSE 141*  BUN 34*  CREATININE 4.45*  CALCIUM 8.3*    Cr Up from baseline see below Lab Results  Component Value Date   CREATININE 4.45 (H) 09/13/2022   CREATININE 3.92 (H) 07/25/2022   CREATININE 3.89 (H) 07/24/2022    No results for input(s): "AST", "ALT", "ALKPHOS", "BILITOT", "PROT", "ALBUMIN" in the last 168 hours. Lab Results  Component Value Date   CALCIUM 8.3 (L) 09/13/2022   PHOS 5.4 (H) 05/30/2022    Plt: Lab Results  Component Value Date   PLT 259 09/13/2022    Recent Labs  Lab 09/13/22 2054  WBC 6.3  HGB 11.2*  HCT 36.1*  MCV 86.0  PLT 259    HG/HCT stable,     Component Value Date/Time   HGB 11.2 (L) 09/13/2022 2054   HGB 12.3 (L) 11/11/2019 1047   HCT 36.1 (L) 09/13/2022 2054   HCT 40.0 11/11/2019 1047   MCV 86.0 09/13/2022 2054   MCV 76 (L) 11/11/2019 1047  _______________________________________________ Hospitalist was called for admission for hypertensive Emergency  The following Work up has been ordered so far:  Orders Placed This Encounter  Procedures   DG Chest 2 View   Basic metabolic panel   CBC   Brain natriuretic peptide   Document Height and Actual Weight   ED Cardiac monitoring   Re-check Vital Signs   Re-check  Vital Signs   Consult to hospitalist   EKG 12-Lead   ED EKG   EKG 12-Lead     OTHER Significant initial  Findings:  labs showing:   DM  labs:  HbA1C: Recent Labs    12/23/21 0407  HGBA1C 8.3*    CBG (last 3)  No results for input(s): "GLUCAP" in the last 72 hours.        Cultures:    Component Value Date/Time   SDES  01/12/2021 1610    BLOOD BLOOD RIGHT HAND Performed at [facility], [address], [place], [place] [address]    SDES  01/12/2021 [phone]    BLOOD BLOOD LEFT FOREARM Performed at [facility], [address] [place], [place] [address]    SPECREQUEST  01/12/2021 [phone]    BOTTLES DRAWN AEROBIC AND ANAEROBIC Blood Culture adequate volume Performed at [facility], [address], [place], [place] [address]    SPECREQUEST  01/12/2021 [phone]    BOTTLES DRAWN AEROBIC AND ANAEROBIC Blood Culture adequate volume Performed at [facility], [address] [place], [place] [address]    CULT  01/12/2021 6295    NO GROWTH 5 DAYS Performed at [facility], [address], [place], [place] [address]    CULT  01/12/2021 [phone]    NO GROWTH 5 DAYS Performed at [facility], [address], [place], [place] [address]    REPTSTATUS 01/17/2021 FINAL 01/12/2021 5366   REPTSTATUS 01/17/2021 FINAL 01/12/2021 0629     Radiological Exams on Admission: DG Chest 2 View  Result Date: 09/13/2022 CLINICAL DATA:  Leg swelling and shortness of breath. EXAM: CHEST - 2 VIEW COMPARISON:  07/24/2022. FINDINGS: Heart is enlarged and the mediastinal contour is within normal limits. The pulmonary vasculature is distended. Diffuse hazy airspace opacities are noted in the lungs bilaterally. There is a small right pleural effusion. No pneumothorax. No acute osseous abnormality. IMPRESSION: 1. Cardiomegaly with pulmonary vascular congestion. 2. Diffuse hazy airspace opacities bilaterally suggesting edema. Electronically Signed   By: Thornell Sartorius M.D.   On: 09/13/2022 21:20   _______________________________________________________________________________________________________ Latest  Blood pressure (!) 184/134, pulse 81, temperature 97.8 F (36.6 C), temperature source Oral, resp. rate (!) 21, SpO2 98 %.   Vitals  labs and radiology finding personally reviewed  Review of Systems:    Pertinent positives include:  Bilateral lower extremity swelling  Constitutional:  No weight loss, night sweats, Fevers, chills, fatigue, weight loss  HEENT:  No headaches, Difficulty swallowing,Tooth/dental problems,Sore throat,  No sneezing, itching, ear ache, nasal congestion, post nasal drip,  Cardio-vascular:  No chest pain, Orthopnea, PND, anasarca, dizziness, palpitations.no  GI:  No heartburn, indigestion, abdominal pain, nausea, vomiting, diarrhea, change in bowel habits, loss of appetite, melena, blood in stool, hematemesis Resp:  no shortness of breath at rest. No dyspnea on exertion, No excess mucus, no productive cough, No non-productive cough, No coughing up of blood.No change in color of mucus.No wheezing. Skin:  no rash or lesions. No jaundice GU:  no dysuria, change in color of urine, no urgency or frequency. No straining to urinate.  No flank pain.  Musculoskeletal:  No joint pain or no joint swelling. No decreased range of motion. No back pain.  Psych:  No change in mood or affect. No depression or anxiety. No memory loss.  Neuro: no localizing neurological complaints, no tingling, no weakness, no double vision, no gait abnormality, no slurred speech,  no confusion  All systems reviewed and apart from HOPI all are negative _______________________________________________________________________________________________ Past Medical History:   Past Medical History:  Diagnosis Date   Chronic kidney disease    Diabetes mellitus without complication (HCC)    Elevated serum cholesterol 04/2019   Elevated serum creatinine 04/2019   Hypertension    Hypertensive urgency 04/2019   Seizures (HCC) 03/2017   x 2    Stroke (HCC) 03/2017   Substance abuse (HCC)    Vitamin D deficiency 04/2019   Past Surgical History:  Procedure Laterality Date   RIGHT HEART CATH N/A 05/31/2021   Procedure: RIGHT HEART CATH;  Surgeon: Corky Crafts, MD;  Location: [facility] INVASIVE CV LAB;  Service: Cardiovascular;   Laterality: N/A;  Social History:  Ambulatory  independently   reports that he quit smoking about 23 years ago. His smoking use included cigarettes. He has never used smokeless tobacco. He reports current alcohol use. He reports current drug use. Drugs: Cocaine and Marijuana. Family History:   Family History  Problem Relation Age of Onset   Diabetes Father    Heart attack Father    Diabetes Sister    Hypertension Brother    ______________________________________________________________________________________________ Allergies: No Known Allergies   Prior to Admission medications   Medication Sig Start Date End Date Taking? Authorizing Provider  albuterol (VENTOLIN HFA) 108 (90 Base) MCG/ACT inhaler Inhale 2 puffs into the lungs every 6 (six) hours as needed for wheezing or shortness of breath. 05/30/22   Marguerita Merles Latif, DO  amLODipine (NORVASC) 10 MG tablet Take 10 mg by mouth daily.    [provider]  aspirin EC 81 MG tablet Take 1 tablet (81 mg total) by mouth daily. 05/30/22   Marguerita Merles Latif, DO  Blood Glucose Monitoring Suppl (TRUE METRIX AIR GLUCOSE METER) DEVI 1 each by Does not apply route 4 (four) times daily -  before meals and at bedtime. 04/19/19   [name], FNP  cloNIDine (CATAPRES) 0.3 MG tablet Take 1 tablet (0.3 mg total) by mouth 3 (three) times daily. 06/08/21   [name], MD  fluticasone (FLONASE) 50 MCG/ACT nasal spray Place 2 sprays into both nostrils 2 (two) times daily. Patient taking differently: Place 2 sprays into both nostrils daily as needed for allergies. 05/30/22   Marguerita Merles Latif, DO  glucose blood (TRUE METRIX BLOOD GLUCOSE TEST) test strip Use as instructed Patient taking differently: 1 each by Other route as directed. 04/19/19   [name], FNP  hydrALAZINE (APRESOLINE) 100 MG tablet Take 1 tablet (100 mg total) by mouth every 8 (eight) hours. Patient taking differently: Take 100 mg by mouth 3 (three) times  daily. 06/24/21   [name], Kardie, DO  insulin glargine (LANTUS SOLOSTAR) 100 UNIT/ML Solostar Pen Inject 5 Units into the skin daily at 10 pm. 06/08/21 07/20/22  [name], MD  Insulin Pen Needle (PEN NEEDLES) 31G X 6 MM MISC 1 each by Does not apply route at bedtime. 10/05/17   Massie Maroon, FNP  INSULIN SYRINGE .5CC/29G (B-D INS SYR ULTRAFINE .5CC/29G) 29G X 1/2" 0.5 ML MISC 1 each by Does not apply route at bedtime. 04/02/17   Massie Maroon, FNP  isosorbide mononitrate (IMDUR) 120 MG 24 hr tablet Take 1 tablet (120 mg total) by mouth daily. 06/09/21   [name], MD  Lancets MISC 1 each by Does not apply route 4 (four) times daily -  before meals and at bedtime. 04/19/19  [name], FNP  losartan (COZAAR) 25 MG tablet Take 2 tablets (50 mg total) by mouth daily. 07/25/22 08/24/22  Jerald Kief, MD  Multiple Vitamins-Minerals (MULTIVITAMIN ADULTS) TABS Take 1 tablet by mouth daily.    [provider]  potassium chloride SA (KLOR-CON M) 20 MEQ tablet Take 40 mEq by mouth daily. 07/09/22   [provider]  torsemide (DEMADEX) 20 MG tablet Take 4 tablets (80 mg total) by mouth daily for 30 doses. 07/25/22 08/24/22  Jerald Kief, MD    ___________________________________________________________________________________________________ Physical Exam:    09/13/2022    9:55 PM 09/13/2022    8:30 PM 07/25/2022    3:20 PM  Vitals with BMI  Systolic 184 240 604  Diastolic 134 163 540  Pulse 81 81      1. General:  in No  Acute distress   Chronically ill -appearing 2. Psychological: Alert and  Oriented 3. Head/ENT:     Dry Mucous Membranes                          Head Non traumatic, neck supple                          Poor Dentition 4. SKIN: decreased Skin turgor,  Skin clean Dry and intact no rash    5. Heart: Regular rate and rhythm no  Murmur, no Rub or gallop 6. Lungs: , no wheezes or crackles   7. Abdomen: Soft,  non-tender,   distended   obese   bowel sounds present 8. Lower extremities: no clubbing, cyanosis,3+edema 9. Neurologically Grossly intact, moving all 4 extremities equally   10. MSK: Normal range of motion    Chart has been reviewed  ______________________________________________________________________________________________  Assessment/Plan  42 y.o. male with medical history significant of DM2, CHF , HTN CKD stage 4, hx of CVA  Admitted for acute on chronic diastolic CHF exacerbation and hypertensive emergency  Present on Admission:  Acute diastolic CHF (congestive heart failure) (HCC)  Acute on chronic heart failure with preserved ejection fraction (HFpEF) (HCC)  CKD (chronic kidney disease) stage 4, GFR 15-29 ml/min (HCC)  Hypertension associated with diabetes (HCC)  Elevated troponin  Hypertensive emergency     Acute on chronic heart failure with preserved ejection fraction (HFpEF) (HCC) - Pt diagnosed with CHF based on presence of the following:  PND, OA, rales on exam,   Pulmonary edema on CXR, and   bilateral leg edema, DOE,   With noted response to IV diuretic in ER  admit on telemetry,  cycle cardiac enzymes, Troponin 45 obtain serial ECG  to evaluate for ischemia as a cause of heart failure   Last BNP BNP (last 3 results) Recent Labs    05/22/22 2217 07/20/22 0918 09/13/22 2054  BNP 613.1* 313.3* 1,149.8*      diurese with IV lasix and monitor orthostatics and creatinine to avoid over diuresis.  Order echogram to evaluate EF and valves  ACE/ARBi contraindicated    Cardiology consult on Monday if no improvement    CKD (chronic kidney disease) stage 4, GFR 15-29 ml/min (HCC)  -chronic avoid nephrotoxic medications such as NSAIDs, Vanco Zosyn combo,  avoid hypotension, continue to follow renal function Will need careful monitoring of renal function    Hypertension associated with diabetes (HCC) Severe poorly controlled hypertension will resume home medications  Insulin-requiring or  dependent type II diabetes mellitus (HCC) Order sliding  scale order Lantus 5 units nightly  Elevated troponin In the setting of CHF exacerbation and hypertensive emergency continue to cycle cardiac enzymes repeat echo in the morning  History of stroke Continue aspirin 81 mg a day  Hypertensive emergency Restart home meds IV labetalol Avoid lowered bp more than 20 % initially  If no improvement may need nitro drip initiation  Cont to cycle CE    Other plan as per orders.  DVT prophylaxis:  SCD      Code Status:    Code Status: Prior FULL CODE  as per patient   I had personally discussed CODE STATUS with patient     ACP none   Family Communication:   Family not at  Bedside accompanied by significant other    Diet diabetes renal diet   Disposition Plan:      To home once workup is complete and patient is stable   Following barriers for discharge:                            Hypertension controlled with PO medications                                 Consult Orders  (From admission, onward)           Start     Ordered   09/13/22 2146  Consult to hospitalist  Once       Provider:  (Not yet assigned)  Question Answer Comment  Place call to: [facility]   Reason for Consult Admit      09/13/22 2145            Consults called: none   Admission status:  ED Disposition     ED Disposition  Admit   Condition  --   Comment  Hospital Area: [facility] [[id number]]  Level of Care: Stepdown [14]  Admit to SDU based on following criteria: Hemodynamic compromise or significant risk of instability:  Patient requiring short term acute titration and management of vasoactive drips, and invasive monitoring (i.e., CVP and Arterial line).  May place patient in observation at [facility] or [facility] if equivalent level of care is available:: No  Covid Evaluation: Asymptomatic - no recent exposure (last 10 days) testing not required  Diagnosis:  Acute diastolic CHF (congestive heart failure) [facility]) [[id number]]  Admitting Physician: Therisa Doyne [[id number]]  Attending Physician: Therisa Doyne [[id number]]           Obs     Level of care   stepdown tele indefinitely please discontinue once patient no longer qualifies COVID-19 Labs    Lab Results  Component Value Date   SARSCOV2NAA NEGATIVE 07/20/2022    Brooksie Ellwanger 09/13/2022, 11:14 PM    [facility]     after 2 AM please page floor coverage PA If 7AM-7PM, please contact the day team taking care of the patient using [email or web address]

## 2022-09-13 NOTE — Assessment & Plan Note (Addendum)
-  chronic avoid nephrotoxic medications such as NSAIDs, Vanco Zosyn combo,  avoid hypotension, continue to follow renal function Will need careful monitoring of renal function

## 2022-09-13 NOTE — ED Provider Notes (Signed)
[place] [facility]ERGENCY DEPARTMENT AT [facility] Provider Note   CSN: [id number] Arrival date & time: 09/13/22  2015     History  Chief Complaint  Patient presents with   Leg Swelling    Micheal Levy is a 42 y.o. male.  The history is provided by the patient and medical records. No language interpreter was used.     42 year old male with significant history of CHF,  hypertension, diabetes, prior stroke, polysubstance use, chronic kidney disease, presenting today with complaints of leg swelling.  Patient report for the past week he noticed progressive worsening bilateral leg swelling with increased shortness of breath when he lays flat.  Shortness of breath is mild.  No fever headache chest pain abdominal pain back pain or leg pain.  He felt the symptoms is similar to prior CHF exacerbation for which he normally gets admitted for and received IV Lasix.  He is currently on torsemide have been compliant with his medication.  He denies any dietary changes.  He denies alcohol or tobacco use.  He denies any recreational drug use.  No prior history of PE or DVT.  Home Medications Prior to Admission medications   Medication Sig Start Date End Date Taking? Authorizing Provider  albuterol (VENTOLIN HFA) 108 (90 Base) MCG/ACT inhaler Inhale 2 puffs into the lungs every 6 (six) hours as needed for wheezing or shortness of breath. 05/30/22   Marguerita Merles Latif, DO  amLODipine (NORVASC) 10 MG tablet Take 10 mg by mouth daily.    [provider]  aspirin EC 81 MG tablet Take 1 tablet (81 mg total) by mouth daily. 05/30/22   Marguerita Merles Latif, DO  Blood Glucose Monitoring Suppl (TRUE METRIX AIR GLUCOSE METER) DEVI 1 each by Does not apply route 4 (four) times daily -  before meals and at bedtime. 04/19/19   [name], FNP  cloNIDine (CATAPRES) 0.3 MG tablet Take 1 tablet (0.3 mg total) by mouth 3 (three) times daily. 06/08/21   [name], MD  fluticasone (FLONASE) 50  MCG/ACT nasal spray Place 2 sprays into both nostrils 2 (two) times daily. Patient taking differently: Place 2 sprays into both nostrils daily as needed for allergies. 05/30/22   Marguerita Merles Latif, DO  glucose blood (TRUE METRIX BLOOD GLUCOSE TEST) test strip Use as instructed Patient taking differently: 1 each by Other route as directed. 04/19/19   [name], FNP  hydrALAZINE (APRESOLINE) 100 MG tablet Take 1 tablet (100 mg total) by mouth every 8 (eight) hours. Patient taking differently: Take 100 mg by mouth 3 (three) times daily. 06/24/21   [name], Kardie, DO  insulin glargine (LANTUS SOLOSTAR) 100 UNIT/ML Solostar Pen Inject 5 Units into the skin daily at 10 pm. 06/08/21 07/20/22  [name], MD  Insulin Pen Needle (PEN NEEDLES) 31G X 6 MM MISC 1 each by Does not apply route at bedtime. 10/05/17   Massie Maroon, FNP  INSULIN SYRINGE .5CC/29G (B-D INS SYR ULTRAFINE .5CC/29G) 29G X 1/2" 0.5 ML MISC 1 each by Does not apply route at bedtime. 04/02/17   Massie Maroon, FNP  isosorbide mononitrate (IMDUR) 120 MG 24 hr tablet Take 1 tablet (120 mg total) by mouth daily. 06/09/21   [name], MD  Lancets MISC 1 each by Does not apply route 4 (four) times daily -  before meals and at bedtime. 04/19/19   [name], FNP  losartan (COZAAR) 25 MG tablet Take 2 tablets (  50 mg total) by mouth daily. 07/25/22 08/24/22  Jerald Kief, MD  Multiple Vitamins-Minerals (MULTIVITAMIN ADULTS) TABS Take 1 tablet by mouth daily.    [provider]  potassium chloride SA (KLOR-CON M) 20 MEQ tablet Take 40 mEq by mouth daily. 07/09/22   [provider]  torsemide (DEMADEX) 20 MG tablet Take 4 tablets (80 mg total) by mouth daily for 30 doses. 07/25/22 08/24/22  Jerald Kief, MD      Allergies    Patient has no known allergies.    Review of Systems   Review of Systems  All other systems reviewed and are negative.   Physical Exam Updated Vital Signs BP (!) 240/163  (BP Location: Left Arm)   Pulse 81   Temp 97.8 F (36.6 C) (Oral)   Resp (!) 21   SpO2 96%  Physical Exam Vitals and nursing note reviewed.  Constitutional:      General: He is not in acute distress.    Appearance: He is well-developed. He is obese.  HENT:     Head: Normocephalic and atraumatic.  Eyes:     Conjunctiva/sclera: Conjunctivae normal.  Neck:     Comments: No JVD Cardiovascular:     Rate and Rhythm: Normal rate and regular rhythm.  Pulmonary:     Effort: Pulmonary effort is normal.     Breath sounds: Normal breath sounds. No wheezing, rhonchi or rales.  Abdominal:     General: There is distension.     Tenderness: There is no abdominal tenderness.  Musculoskeletal:     Cervical back: Neck supple.     Right lower leg: Edema present.     Left lower leg: Edema present.     Comments: 3+ pitting edema to bilateral lower extremities extending towards the knees with intact distal pedal pulses and no erythema.  Skin:    Capillary Refill: Capillary refill takes less than 2 seconds.     Findings: No rash.  Neurological:     Mental Status: He is alert and oriented to person, place, and time.     ED Results / Procedures / Treatments   Labs (all labs ordered are listed, but only abnormal results are displayed) Labs Reviewed  BASIC METABOLIC PANEL - Abnormal; Notable for the following components:      Result Value   CO2 20 (*)    Glucose, Bld 141 (*)    BUN 34 (*)    Creatinine, Ser 4.45 (*)    Calcium 8.3 (*)    GFR, Estimated 16 (*)    All other components within normal limits  CBC - Abnormal; Notable for the following components:   RBC 4.20 (*)    Hemoglobin 11.2 (*)    HCT 36.1 (*)    RDW 17.5 (*)    All other components within normal limits  BRAIN NATRIURETIC PEPTIDE - Abnormal; Notable for the following components:   B Natriuretic Peptide 1,149.8 (*)    All other components within normal limits  TROPONIN I (HIGH SENSITIVITY) - Abnormal; Notable for the  following components:   Troponin I (High Sensitivity) 43 (*)    All other components within normal limits  RAPID URINE DRUG SCREEN, HOSP PERFORMED  CK  MAGNESIUM  PHOSPHORUS  TSH  URINALYSIS, COMPLETE (UACMP) WITH MICROSCOPIC  PREALBUMIN  TROPONIN I (HIGH SENSITIVITY)    EKG None ED ECG REPORT   Date: 09/13/2022  Rate: 76  Rhythm: normal sinus rhythm  QRS Axis: normal  Intervals:  borderline  prolonged QT  ST/T Wave abnormalities: nonspecific T wave changes  Conduction Disutrbances:none  Narrative Interpretation:   Old EKG Reviewed: unchanged  I have personally reviewed the EKG tracing and agree with the computerized printout as noted.   Radiology DG Chest 2 View  Result Date: 09/13/2022 CLINICAL DATA:  Leg swelling and shortness of breath. EXAM: CHEST - 2 VIEW COMPARISON:  07/24/2022. FINDINGS: Heart is enlarged and the mediastinal contour is within normal limits. The pulmonary vasculature is distended. Diffuse hazy airspace opacities are noted in the lungs bilaterally. There is a small right pleural effusion. No pneumothorax. No acute osseous abnormality. IMPRESSION: 1. Cardiomegaly with pulmonary vascular congestion. 2. Diffuse hazy airspace opacities bilaterally suggesting edema. Electronically Signed   By: Thornell Sartorius M.D.   On: 09/13/2022 21:20    Procedures .Critical Care  Performed by: Fayrene Helper, PA-C Authorized by: Fayrene Helper, PA-C   Critical care provider statement:    Critical care time (minutes):  40   Critical care was time spent personally by me on the following activities:  Development of treatment plan with patient or surrogate, discussions with consultants, evaluation of patient's response to treatment, examination of patient, ordering and review of laboratory studies, ordering and review of radiographic studies, ordering and performing treatments and interventions, pulse oximetry, re-evaluation of patient's condition and review of old charts      Medications Ordered in ED Medications  furosemide (LASIX) 10 MG/ML injection (has no administration in time range)  insulin aspart (novoLOG) injection 0-5 Units (has no administration in time range)  insulin aspart (novoLOG) injection 0-9 Units (has no administration in time range)  furosemide (LASIX) injection 80 mg (80 mg Intravenous Given 09/13/22 2121)  hydrALAZINE (APRESOLINE) injection 10 mg (10 mg Intravenous Given 09/13/22 2121)    ED Course/ Medical Decision Making/ A&P                             Medical Decision Making Risk Prescription drug management.   BP (!) 240/163 (BP Location: Left Arm)   Pulse 81   Temp 97.8 F (36.6 C) (Oral)   Resp (!) 21   SpO2 96%   75:4 PM 42 year old male with significant history of CHF,  hypertension, diabetes, prior stroke, polysubstance use, chronic kidney disease, presenting today with complaints of leg swelling.  Patient report for the past week he noticed progressive worsening bilateral leg swelling with increased shortness of breath when he lays flat.  Shortness of breath is mild.  No fever headache chest pain abdominal pain back pain or leg pain.  He felt the symptoms is similar to prior CHF exacerbation for which he normally gets admitted for and received IV Lasix.  He is currently on torsemide have been compliant with his medication.  He denies any dietary changes.  He denies alcohol or tobacco use.  He denies any recreational drug use.  No prior history of PE or DVT.  On exam this is an obese male sitting in the bed appears to be in no acute respiratory discomfort.  He does not have any JVD, lungs otherwise clear to auscultation bilaterally heart with normal rate and rhythm, abdomen is distended but nontender.  Patient has 3+ pitting edema to bilateral lower extremities extending towards his knees.  No calf tenderness.  Vital signs notable for markedly elevated blood pressure of 240/163.  Patient is afebrile no hypoxia.  His  presentation concerning for CHF exacerbation likely would benefit from  hospital admission for diuresis.  Workup initiated.  -Labs ordered, independently viewed and interpreted by me.  Labs remarkable for BNP 1149, which is markedly elevated from prior.  Cr. 4.45, worse compare to baseline, Trop 43, likely demand ischemia from hypertensive emergency -The patient was maintained on a cardiac monitor.  I personally viewed and interpreted the cardiac monitored which showed an underlying rhythm of: NSR -Imaging independently viewed and interpreted by me and I agree with radiologist's interpretation.  Result remarkable for CXR showing edema and vascular congestion -This patient presents to the ED for concern of edema, this involves an extensive number of treatment options, and is a complaint that carries with it a high risk of complications and morbidity.  The differential diagnosis includes CHF exacerbation, hypertensive emergency, renal artery stenosis, DVT -Co morbidities that complicate the patient evaluation includes CHF -Treatment includes Lasix -Reevaluation of the patient after these medicines showed that the patient improved -PCP office notes or outside notes reviewed -Discussion with specialist [facility] Dr. Adela Glimpse who agrees to see and admit pt -Escalation to admission/observation considered: patient is agreeable with admission.         Final Clinical Impression(s) / ED Diagnoses Final diagnoses:  Acute on chronic diastolic congestive heart failure [facility])  Hypertensive emergency    Rx / DC Orders ED Discharge Orders     None         Fayrene Helper, PA-C 09/13/22 2221    Charlynne Pander, MD 09/13/22 [phone]

## 2022-09-13 NOTE — Assessment & Plan Note (Signed)
Severe poorly controlled hypertension will resume home medications

## 2022-09-13 NOTE — Subjective & Objective (Signed)
Pt presents with leg edema  States she ahs been compliant with diuretics and BP meds Has been more short of breath especially when she lays down for the past 1 week and notes increased leg swelling no fevers or chills no cough similar to prior CHF exacerbation has been taking her torsemide at home no history of blood clots

## 2022-09-13 NOTE — Assessment & Plan Note (Signed)
-   Pt diagnosed with CHF based on presence of the following:  PND, OA, rales on exam,   Pulmonary edema on CXR, and   bilateral leg edema, DOE,   With noted response to IV diuretic in ER  admit on telemetry,  cycle cardiac enzymes, Troponin 45 obtain serial ECG  to evaluate for ischemia as a cause of heart failure   Last BNP BNP (last 3 results) Recent Labs    05/22/22 2217 07/20/22 0918 09/13/22 2054  BNP 613.1* 313.3* 1,149.8*      diurese with IV lasix and monitor orthostatics and creatinine to avoid over diuresis.  Order echogram to evaluate EF and valves  ACE/ARBi contraindicated    Cardiology consult on Monday if no improvement

## 2022-09-13 NOTE — Assessment & Plan Note (Signed)
Continue aspirin 81 mg a day.

## 2022-09-13 NOTE — Assessment & Plan Note (Addendum)
In the setting of CHF exacerbation and hypertensive emergency continue to cycle cardiac enzymes repeat echo in the morning

## 2022-09-14 ENCOUNTER — Observation Stay (HOSPITAL_COMMUNITY): Payer: Medicaid Other

## 2022-09-14 DIAGNOSIS — I5031 Acute diastolic (congestive) heart failure: Secondary | ICD-10-CM | POA: Diagnosis not present

## 2022-09-14 DIAGNOSIS — N179 Acute kidney failure, unspecified: Secondary | ICD-10-CM | POA: Diagnosis present

## 2022-09-14 DIAGNOSIS — Z79899 Other long term (current) drug therapy: Secondary | ICD-10-CM | POA: Diagnosis not present

## 2022-09-14 DIAGNOSIS — Z8249 Family history of ischemic heart disease and other diseases of the circulatory system: Secondary | ICD-10-CM | POA: Diagnosis not present

## 2022-09-14 DIAGNOSIS — Z1152 Encounter for screening for COVID-19: Secondary | ICD-10-CM | POA: Diagnosis not present

## 2022-09-14 DIAGNOSIS — E1122 Type 2 diabetes mellitus with diabetic chronic kidney disease: Secondary | ICD-10-CM | POA: Diagnosis present

## 2022-09-14 DIAGNOSIS — Z7982 Long term (current) use of aspirin: Secondary | ICD-10-CM | POA: Diagnosis not present

## 2022-09-14 DIAGNOSIS — I10 Essential (primary) hypertension: Secondary | ICD-10-CM | POA: Diagnosis present

## 2022-09-14 DIAGNOSIS — Z8673 Personal history of transient ischemic attack (TIA), and cerebral infarction without residual deficits: Secondary | ICD-10-CM | POA: Diagnosis not present

## 2022-09-14 DIAGNOSIS — D631 Anemia in chronic kidney disease: Secondary | ICD-10-CM | POA: Diagnosis present

## 2022-09-14 DIAGNOSIS — I5033 Acute on chronic diastolic (congestive) heart failure: Secondary | ICD-10-CM | POA: Diagnosis present

## 2022-09-14 DIAGNOSIS — E876 Hypokalemia: Secondary | ICD-10-CM | POA: Diagnosis present

## 2022-09-14 DIAGNOSIS — Z794 Long term (current) use of insulin: Secondary | ICD-10-CM | POA: Diagnosis not present

## 2022-09-14 DIAGNOSIS — Z6841 Body Mass Index (BMI) 40.0 and over, adult: Secondary | ICD-10-CM | POA: Diagnosis not present

## 2022-09-14 DIAGNOSIS — Z87891 Personal history of nicotine dependence: Secondary | ICD-10-CM | POA: Diagnosis not present

## 2022-09-14 DIAGNOSIS — I150 Renovascular hypertension: Secondary | ICD-10-CM | POA: Diagnosis not present

## 2022-09-14 DIAGNOSIS — I130 Hypertensive heart and chronic kidney disease with heart failure and stage 1 through stage 4 chronic kidney disease, or unspecified chronic kidney disease: Secondary | ICD-10-CM | POA: Diagnosis present

## 2022-09-14 DIAGNOSIS — N184 Chronic kidney disease, stage 4 (severe): Secondary | ICD-10-CM | POA: Diagnosis present

## 2022-09-14 DIAGNOSIS — Z833 Family history of diabetes mellitus: Secondary | ICD-10-CM | POA: Diagnosis not present

## 2022-09-14 DIAGNOSIS — I2489 Other forms of acute ischemic heart disease: Secondary | ICD-10-CM | POA: Diagnosis present

## 2022-09-14 DIAGNOSIS — I152 Hypertension secondary to endocrine disorders: Secondary | ICD-10-CM | POA: Diagnosis present

## 2022-09-14 DIAGNOSIS — E1169 Type 2 diabetes mellitus with other specified complication: Secondary | ICD-10-CM | POA: Diagnosis present

## 2022-09-14 DIAGNOSIS — I161 Hypertensive emergency: Secondary | ICD-10-CM | POA: Diagnosis present

## 2022-09-14 DIAGNOSIS — D6489 Other specified anemias: Secondary | ICD-10-CM | POA: Diagnosis present

## 2022-09-14 LAB — BLOOD GAS, VENOUS
Acid-Base Excess: 0.7 mmol/L (ref 0.0–2.0)
Bicarbonate: 24.3 mmol/L (ref 20.0–28.0)
O2 Saturation: 89 %
Patient temperature: 37
pCO2, Ven: 35 mmHg — ABNORMAL LOW (ref 44–60)
pH, Ven: 7.45 — ABNORMAL HIGH (ref 7.25–7.43)
pO2, Ven: 55 mmHg — ABNORMAL HIGH (ref 32–45)

## 2022-09-14 LAB — CBC
HCT: 33 % — ABNORMAL LOW (ref 39.0–52.0)
Hemoglobin: 10.4 g/dL — ABNORMAL LOW (ref 13.0–17.0)
MCH: 26.7 pg (ref 26.0–34.0)
MCHC: 31.5 g/dL (ref 30.0–36.0)
MCV: 84.8 fL (ref 80.0–100.0)
Platelets: 275 10*3/uL (ref 150–400)
RBC: 3.89 MIL/uL — ABNORMAL LOW (ref 4.22–5.81)
RDW: 17.8 % — ABNORMAL HIGH (ref 11.5–15.5)
WBC: 6.9 10*3/uL (ref 4.0–10.5)
nRBC: 0 % (ref 0.0–0.2)

## 2022-09-14 LAB — URINALYSIS, COMPLETE (UACMP) WITH MICROSCOPIC
Bacteria, UA: NONE SEEN
Bilirubin Urine: NEGATIVE
Glucose, UA: 50 mg/dL — AB
Ketones, ur: NEGATIVE mg/dL
Nitrite: NEGATIVE
Protein, ur: 100 mg/dL — AB
Specific Gravity, Urine: 1.005 (ref 1.005–1.030)
pH: 7 (ref 5.0–8.0)

## 2022-09-14 LAB — HEPATIC FUNCTION PANEL
ALT: 10 U/L (ref 0–44)
AST: 21 U/L (ref 15–41)
Albumin: 3 g/dL — ABNORMAL LOW (ref 3.5–5.0)
Alkaline Phosphatase: 60 U/L (ref 38–126)
Bilirubin, Direct: 0.3 mg/dL — ABNORMAL HIGH (ref 0.0–0.2)
Indirect Bilirubin: 0.6 mg/dL (ref 0.3–0.9)
Total Bilirubin: 0.9 mg/dL (ref 0.3–1.2)
Total Protein: 6.2 g/dL — ABNORMAL LOW (ref 6.5–8.1)

## 2022-09-14 LAB — HEMOGLOBIN A1C
Hgb A1c MFr Bld: 7.2 % — ABNORMAL HIGH (ref 4.8–5.6)
Mean Plasma Glucose: 159.94 mg/dL

## 2022-09-14 LAB — GLUCOSE, CAPILLARY
Glucose-Capillary: 127 mg/dL — ABNORMAL HIGH (ref 70–99)
Glucose-Capillary: 131 mg/dL — ABNORMAL HIGH (ref 70–99)
Glucose-Capillary: 120 mg/dL — ABNORMAL HIGH (ref 70–99)

## 2022-09-14 LAB — ECHOCARDIOGRAM COMPLETE
Area-P 1/2: 4.04 cm2
Height: 68 in
S' Lateral: 3.2 cm
Weight: 4324.54 oz

## 2022-09-14 LAB — RAPID URINE DRUG SCREEN, HOSP PERFORMED
Amphetamines: NOT DETECTED
Barbiturates: NOT DETECTED
Benzodiazepines: NOT DETECTED
Cocaine: NOT DETECTED
Opiates: NOT DETECTED
Tetrahydrocannabinol: NOT DETECTED

## 2022-09-14 LAB — COMPREHENSIVE METABOLIC PANEL
ALT: 11 U/L (ref 0–44)
AST: 22 U/L (ref 15–41)
Albumin: 2.8 g/dL — ABNORMAL LOW (ref 3.5–5.0)
Alkaline Phosphatase: 56 U/L (ref 38–126)
Anion gap: 10 (ref 5–15)
BUN: 33 mg/dL — ABNORMAL HIGH (ref 6–20)
CO2: 20 mmol/L — ABNORMAL LOW (ref 22–32)
Calcium: 8 mg/dL — ABNORMAL LOW (ref 8.9–10.3)
Chloride: 106 mmol/L (ref 98–111)
Creatinine, Ser: 4.35 mg/dL — ABNORMAL HIGH (ref 0.61–1.24)
GFR, Estimated: 16 mL/min — ABNORMAL LOW (ref >60)
Glucose, Bld: 151 mg/dL — ABNORMAL HIGH (ref 70–99)
Potassium: 3.6 mmol/L (ref 3.5–5.1)
Sodium: 136 mmol/L (ref 135–145)
Total Bilirubin: 0.9 mg/dL (ref 0.3–1.2)
Total Protein: 6.1 g/dL — ABNORMAL LOW (ref 6.5–8.1)

## 2022-09-14 LAB — RETICULOCYTES
Immature Retic Fract: 26.2 % — ABNORMAL HIGH (ref 2.3–15.9)
RBC.: 3.91 MIL/uL — ABNORMAL LOW (ref 4.22–5.81)
Retic Count, Absolute: 103.2 10*3/uL (ref 19.0–186.0)
Retic Ct Pct: 2.6 % (ref 0.4–3.1)

## 2022-09-14 LAB — BASIC METABOLIC PANEL
Anion gap: 8 (ref 5–15)
BUN: 34 mg/dL — ABNORMAL HIGH (ref 6–20)
CO2: 22 mmol/L (ref 22–32)
Calcium: 8.3 mg/dL — ABNORMAL LOW (ref 8.9–10.3)
Chloride: 108 mmol/L (ref 98–111)
Creatinine, Ser: 4.35 mg/dL — ABNORMAL HIGH (ref 0.61–1.24)
GFR, Estimated: 16 mL/min — ABNORMAL LOW (ref >60)
Glucose, Bld: 157 mg/dL — ABNORMAL HIGH (ref 70–99)
Potassium: 3.4 mmol/L — ABNORMAL LOW (ref 3.5–5.1)
Sodium: 138 mmol/L (ref 135–145)

## 2022-09-14 LAB — PHOSPHORUS: Phosphorus: 3.9 mg/dL (ref 2.5–4.6)

## 2022-09-14 LAB — CBG MONITORING, ED
Glucose-Capillary: 125 mg/dL — ABNORMAL HIGH (ref 70–99)
Glucose-Capillary: 141 mg/dL — ABNORMAL HIGH (ref 70–99)

## 2022-09-14 LAB — VITAMIN B12: Vitamin B-12: 238 pg/mL (ref 180–914)

## 2022-09-14 LAB — IRON AND TIBC
Iron: 58 ug/dL (ref 45–182)
Saturation Ratios: 19 % (ref 17.9–39.5)
TIBC: 311 ug/dL (ref 250–450)
UIBC: 253 ug/dL

## 2022-09-14 LAB — TROPONIN I (HIGH SENSITIVITY): Troponin I (High Sensitivity): 35 ng/L — ABNORMAL HIGH (ref <18)

## 2022-09-14 LAB — MAGNESIUM: Magnesium: 2 mg/dL (ref 1.7–2.4)

## 2022-09-14 LAB — PREALBUMIN: Prealbumin: 25 mg/dL (ref 18–38)

## 2022-09-14 LAB — FERRITIN: Ferritin: 57 ng/mL (ref 24–336)

## 2022-09-14 LAB — MRSA NEXT GEN BY PCR, NASAL: MRSA by PCR Next Gen: NOT DETECTED

## 2022-09-14 LAB — TSH: TSH: 1.998 u[IU]/mL (ref 0.350–4.500)

## 2022-09-14 MED ORDER — PERFLUTREN LIPID MICROSPHERE
1.0000 mL | INTRAVENOUS | Status: AC | PRN
  Administered 2022-09-14: 3 mL via INTRAVENOUS

## 2022-09-14 MED ORDER — CHLORHEXIDINE GLUCONATE CLOTH 2 % EX PADS
6.0000 | MEDICATED_PAD | Freq: Every day | CUTANEOUS | Status: DC
  Administered 2022-09-14 – 2022-09-16 (×4): 6 via TOPICAL

## 2022-09-14 NOTE — Progress Notes (Signed)
[facility] Progress Note  Patient: THEOPHILE DUBOW     ZOX:[id number]  DOA: 09/13/2022   PCP: Barbette Merino, NP       Brief hospital course: This is a 42 year old male with, congestive heart failure, CKD, diabetes mellitus and hypertension who presents to the hospital for shortness of breath, orthopnea and swelling of her lower extremities.  She states she is taking all her medications at home but despite this has been worsening. In the ED, blood pressure noted to be significantly elevated with systolics mostly in the 200s and diastolics in the 140s. Chest x-ray consistent with pulmonary edema, BNP 1149, creatinine 4.45 and troponin 43. She was admitted for hypertensive emergency  Subjective:  He admits to ongoing dyspnea. No other complaints.  Assessment and Plan: Principal Problem:   Acute diastolic CHF (congestive heart failure) (HCC)/hypertensive emergency - 2D echo reveals grade 2 diastolic dysfunction with severe concentric LVH - Continue Lasix, institute fluid restriction and monitor strict I and O -Control BP-continue Catapres, carvedilol, amlodipine, Imdur, hydralazine and as needed IV labetalol  Active problems:  Elevated troponin - Possibly coronary strain in addition to elevated creatinine    CKD (chronic kidney disease) stage 4, GFR 15-29 ml/min (HCC) -Creatinine slightly elevated from baseline which appears to be 3.7-3.9 - Continue to diurese and follow for improvement     Insulin-requiring or dependent type II diabetes mellitus (HCC) -Continue Semglee and sliding scale insulin        Code Status: Full Code Consultants: None Level of Care: Level of care: Stepdown Total time on patient care:  DVT prophylaxis:  SCDs Start: 09/13/22 2302     Objective:   Vitals:   09/14/22 1000 09/14/22 1030 09/14/22 1100 09/14/22 1200  BP: (!) 163/89 (!) 176/114 (!) 183/103 (!) 181/119  Pulse: 66 69 (!) 58 70  Resp: (!) 24 (!) 24 (!) 25 20  Temp:    98 F  (36.7 C)  TempSrc:    Oral  SpO2: 92% 93% 94% 97%  Weight:      Height:       Filed Weights   09/14/22 0856  Weight: 122.6 kg   Exam: General exam: Appears comfortable  HEENT: oral mucosa moist Respiratory system: crackles in b/l lower lobes Cardiovascular system: S1 & S2 heard  Gastrointestinal system: Abdomen soft, non-tender, nondistended. Normal bowel sounds   Extremities: No cyanosis, clubbing - b/l pedal edema Psychiatry:  Mood & affect appropriate.      CBC: Recent Labs  Lab 09/13/22 2054 09/14/22 0158  WBC 6.3 6.9  HGB 11.2* 10.4*  HCT 36.1* 33.0*  MCV 86.0 84.8  PLT 259 275   Basic Metabolic Panel: Recent Labs  Lab 09/13/22 2054 09/14/22 0158  NA 138 136  K 4.0 3.6  CL 107 106  CO2 20* 20*  GLUCOSE 141* 151*  BUN 34* 33*  CREATININE 4.45* 4.35*  CALCIUM 8.3* 8.0*  MG 2.3 2.0  PHOS 4.0 3.9   GFR: Estimated Creatinine Clearance: 27.6 mL/min (A) (by C-G formula based on SCr of 4.35 mg/dL (H)).  Scheduled Meds:  amLODipine  10 mg Oral Daily   aspirin EC  81 mg Oral Daily   carvedilol  25 mg Oral BID   Chlorhexidine Gluconate Cloth  6 each Topical Daily   cloNIDine  0.3 mg Oral TID   furosemide  80 mg Intravenous Q12H   hydrALAZINE  100 mg Oral TID   insulin aspart  0-5 Units Subcutaneous QHS   insulin  aspart  0-9 Units Subcutaneous TID WC   insulin glargine-yfgn  5 Units Subcutaneous QHS   isosorbide mononitrate  120 mg Oral Daily   sodium chloride flush  3 mL Intravenous Q12H   Continuous Infusions:  sodium chloride     Imaging and lab data was personally reviewed ECHOCARDIOGRAM COMPLETE  Result Date: 09/14/2022    ECHOCARDIOGRAM REPORT   Patient Name:   MASIH YAKE Date of Exam: 09/14/2022 Medical Rec #:  [id number]       Height:       68.0 in Accession #:    [id number]      Weight:       270.3 lb Date of Birth:  [birth year]       BSA:          2.323 m Patient Age:    42 years        BP:           221/144 mmHg Patient Gender: M                HR:           78 bpm. Exam Location:  Inpatient Procedure: 2D Echo, Color Doppler, Cardiac Doppler and Intracardiac            Opacification Agent Indications:    I50.31 Acute diastolic (congestive) heart failure  History:        Patient has prior history of Echocardiogram examinations, most                 recent 05/23/2022. CHF; Risk Factors:Hypertension, Diabetes and                 Dyslipidemia.  Sonographer:    Irving Burton Senior RDCS Referring Phys: [id number] ANASTASSIA DOUTOVA  Sonographer Comments: Technically difficult due to poor echo windows from lung interference (possible pneumonia) IMPRESSIONS  1. Left ventricular ejection fraction, by estimation, is 60 to 65%. The left ventricle has normal function. The left ventricle has no regional wall motion abnormalities. There is severe concentric left ventricular hypertrophy. Left ventricular diastolic  parameters are consistent with Grade III diastolic dysfunction (restrictive).  2. Right ventricular systolic function is normal. The right ventricular size is mildly enlarged. Tricuspid regurgitation signal is inadequate for assessing PA pressure.  3. Left atrial size was mildly dilated.  4. The mitral valve is normal in structure. Trivial mitral valve regurgitation.  5. The aortic valve is grossly normal. Aortic valve regurgitation is not visualized. No aortic stenosis is present.  6. The inferior vena cava is dilated in size with <50% respiratory variability, suggesting right atrial pressure of 15 mmHg. Comparison(s): No significant change from prior study. FINDINGS  Left Ventricle: Left ventricular ejection fraction, by estimation, is 60 to 65%. The left ventricle has normal function. The left ventricle has no regional wall motion abnormalities. Definity contrast agent was given IV to delineate the left ventricular  endocardial borders. The left ventricular internal cavity size was normal in size. There is severe concentric left ventricular hypertrophy. Left  ventricular diastolic parameters are consistent with Grade III diastolic dysfunction (restrictive). Right Ventricle: The right ventricular size is mildly enlarged. No increase in right ventricular wall thickness. Right ventricular systolic function is normal. Tricuspid regurgitation signal is inadequate for assessing PA pressure. Left Atrium: Left atrial size was mildly dilated. Right Atrium: Right atrial size was not well visualized. Pericardium: Trivial pericardial effusion is present. Presence of epicardial fat layer. Mitral Valve: The mitral valve is normal in structure.  Trivial mitral valve regurgitation. Tricuspid Valve: The tricuspid valve is normal in structure. Tricuspid valve regurgitation is trivial. Aortic Valve: The aortic valve is grossly normal. Aortic valve regurgitation is not visualized. No aortic stenosis is present. Pulmonic Valve: The pulmonic valve was grossly normal. Pulmonic valve regurgitation is not visualized. No evidence of pulmonic stenosis. Aorta: The aortic root and ascending aorta are structurally normal, with no evidence of dilitation. Venous: The inferior vena cava is dilated in size with less than 50% respiratory variability, suggesting right atrial pressure of 15 mmHg. IAS/Shunts: The interatrial septum was not well visualized.  LEFT VENTRICLE PLAX 2D LVIDd:         4.80 cm   Diastology LVIDs:         3.20 cm   LV e' medial:    3.48 cm/s LV PW:         1.80 cm   LV E/e' medial:  27.6 LV IVS:        1.60 cm   LV e' lateral:   4.35 cm/s LVOT diam:     2.10 cm   LV E/e' lateral: 22.1 LV SV:         66 LV SV Index:   28 LVOT Area:     3.46 cm  RIGHT VENTRICLE RV S prime:     12.80 cm/s TAPSE (M-mode): 2.6 cm LEFT ATRIUM             Index        RIGHT ATRIUM           Index LA diam:        4.50 cm 1.94 cm/m   RA Area:     26.60 cm LA Vol (A2C):   98.5 ml 42.41 ml/m  RA Volume:   95.90 ml  41.29 ml/m LA Vol (A4C):   83.6 ml 35.99 ml/m LA Biplane Vol: 95.4 ml 41.07 ml/m  AORTIC  VALVE LVOT Vmax:   117.00 cm/s LVOT Vmean:  85.100 cm/s LVOT VTI:    0.191 m  AORTA Ao Root diam: 3.40 cm Ao Asc diam:  3.60 cm MITRAL VALVE MV Area (PHT): 4.04 cm    SHUNTS MV Decel Time: 188 msec    Systemic VTI:  0.19 m MV E velocity: 96.10 cm/s  Systemic Diam: 2.10 cm MV A velocity: 38.60 cm/s MV E/A ratio:  2.49 Lennie Odor MD Electronically signed by Lennie Odor MD Signature Date/Time: 09/14/2022/11:40:05 AM    Final    DG Chest 2 View  Result Date: 09/13/2022 CLINICAL DATA:  Leg swelling and shortness of breath. EXAM: CHEST - 2 VIEW COMPARISON:  07/24/2022. FINDINGS: Heart is enlarged and the mediastinal contour is within normal limits. The pulmonary vasculature is distended. Diffuse hazy airspace opacities are noted in the lungs bilaterally. There is a small right pleural effusion. No pneumothorax. No acute osseous abnormality. IMPRESSION: 1. Cardiomegaly with pulmonary vascular congestion. 2. Diffuse hazy airspace opacities bilaterally suggesting edema. Electronically Signed   By: Thornell Sartorius M.D.   On: 09/13/2022 21:20    LOS: 0 days   Author: Calvert Cantor  09/14/2022 12:47 PM  To contact [facility]>   Check the care team in [facility] and look for the attending/consulting [facility] provider listed  Log into [email or web address] and use [place]'s universal password   Go to> "[facility]"  and find provider  If you still have difficulty reaching the provider, please page the [facility] (Director on Call) for the Hospitalists listed on amion

## 2022-09-15 ENCOUNTER — Inpatient Hospital Stay (HOSPITAL_COMMUNITY): Payer: Medicaid Other

## 2022-09-15 DIAGNOSIS — I150 Renovascular hypertension: Secondary | ICD-10-CM

## 2022-09-15 DIAGNOSIS — I5031 Acute diastolic (congestive) heart failure: Secondary | ICD-10-CM | POA: Diagnosis not present

## 2022-09-15 LAB — GLUCOSE, CAPILLARY
Glucose-Capillary: 138 mg/dL — ABNORMAL HIGH (ref 70–99)
Glucose-Capillary: 131 mg/dL — ABNORMAL HIGH (ref 70–99)

## 2022-09-15 MED ORDER — FUROSEMIDE 10 MG/ML IJ SOLN
120.0000 mg | Freq: Two times a day (BID) | INTRAVENOUS | Status: DC
  Administered 2022-09-15 – 2022-09-17 (×4): 120 mg via INTRAVENOUS
  Filled 2022-09-15 (×4): qty 10

## 2022-09-15 MED ORDER — HYDRALAZINE HCL 20 MG/ML IJ SOLN
10.0000 mg | Freq: Four times a day (QID) | INTRAMUSCULAR | Status: DC | PRN
  Administered 2022-09-15 – 2022-09-16 (×2): 10 mg via INTRAVENOUS
  Filled 2022-09-15 (×2): qty 1

## 2022-09-15 MED ORDER — POTASSIUM CHLORIDE CRYS ER 20 MEQ PO TBCR
40.0000 meq | EXTENDED_RELEASE_TABLET | ORAL | Status: AC
  Administered 2022-09-15 (×2): 40 meq via ORAL
  Filled 2022-09-15 (×2): qty 2

## 2022-09-15 MED ORDER — LABETALOL HCL 5 MG/ML IV SOLN
5.0000 mg | Freq: Once | INTRAVENOUS | Status: AC | PRN
  Administered 2022-09-15: 5 mg via INTRAVENOUS
  Filled 2022-09-15: qty 4

## 2022-09-15 NOTE — Progress Notes (Signed)
[facility] Progress Note  Patient: Micheal Levy     WUJ:[id number]  DOA: 09/13/2022   PCP: Barbette Merino, NP       Brief hospital course: This is a 42 year old male with, congestive heart failure, CKD, diabetes mellitus and hypertension who presents to the hospital for shortness of breath, orthopnea and swelling of her lower extremities.  She states she is taking all her medications at home but despite this has been worsening. In the ED, blood pressure noted to be significantly elevated with systolics mostly in the 200s and diastolics in the 140s. Chest x-ray consistent with pulmonary edema, BNP 1149, creatinine 4.45 and troponin 43. She was admitted for hypertensive emergency  Subjective:  Dyspnea improving.   Assessment and Plan: Principal Problem:   Acute diastolic CHF (congestive heart failure) (HCC)/hypertensive emergency - 2D echo reveals grade 2 diastolic dysfunction with severe concentric LVH - Continue Lasix, institute fluid restriction and monitor strict I and O -Control BP-continue Catapres, carvedilol, amlodipine, Imdur, hydralazine and as needed IV Hydralazine - renal artery duplex today does not reveal RAS - consult nephrology to see if ACE/ARB or Nelva Nay is reasonable  Active problems:  Elevated troponin - Possibly coronary strain in addition to elevated creatinine    CKD (chronic kidney disease) stage 4, GFR 15-29 ml/min (HCC) -Creatinine slightly elevated from baseline which appears to be 3.7-3.9 - Continue to diurese and follow for improvement  Hypokalemia - replace- check Mg    Insulin-requiring or dependent type II diabetes mellitus (HCC) -Continue Semglee and sliding scale insulin  Morbid obesity Body mass index is 40.86 kg/m.        Code Status: Full Code Consultants: None Level of Care: Level of care: Stepdown Total time on patient care: 30 min DVT prophylaxis:  SCDs Start: 09/13/22 2302     Objective:   Vitals:    09/15/22 0505 09/15/22 0530 09/15/22 0600 09/15/22 0730  BP: (!) 203/141 (!) 221/124 (!) 208/122 (!) 218/126  Pulse: 66 66  (!) 57  Resp: (!) 25 (!) 25 20 (!) 26  Temp:      TempSrc:      SpO2: 96% 96%  98%  Weight:      Height:       Filed Weights   09/14/22 0856 09/15/22 0429  Weight: 122.6 kg 121.9 kg   Exam: General exam: Appears comfortable  HEENT: oral mucosa moist Respiratory system: Clear to auscultation.  Cardiovascular system: S1 & S2 heard  Gastrointestinal system: Abdomen soft, non-tender, nondistended. Normal bowel sounds   Extremities: No cyanosis, clubbing or edema Psychiatry:  Mood & affect appropriate.    CBC: Recent Labs  Lab 09/13/22 2054 09/14/22 0158  WBC 6.3 6.9  HGB 11.2* 10.4*  HCT 36.1* 33.0*  MCV 86.0 84.8  PLT 259 275    Basic Metabolic Panel: Recent Labs  Lab 09/13/22 2054 09/14/22 0158 09/14/22 1517  NA 138 136 138  K 4.0 3.6 3.4*  CL 107 106 108  CO2 20* 20* 22  GLUCOSE 141* 151* 157*  BUN 34* 33* 34*  CREATININE 4.45* 4.35* 4.35*  CALCIUM 8.3* 8.0* 8.3*  MG 2.3 2.0  --   PHOS 4.0 3.9  --     GFR: Estimated Creatinine Clearance: 27.5 mL/min (A) (by C-G formula based on SCr of 4.35 mg/dL (H)).  Scheduled Meds:  amLODipine  10 mg Oral Daily   aspirin EC  81 mg Oral Daily   carvedilol  25 mg Oral BID  Chlorhexidine Gluconate Cloth  6 each Topical Daily   cloNIDine  0.3 mg Oral TID   furosemide  80 mg Intravenous Q12H   hydrALAZINE  100 mg Oral TID   insulin aspart  0-5 Units Subcutaneous QHS   insulin aspart  0-9 Units Subcutaneous TID WC   insulin glargine-yfgn  5 Units Subcutaneous QHS   isosorbide mononitrate  120 mg Oral Daily   sodium chloride flush  3 mL Intravenous Q12H   Continuous Infusions:  sodium chloride     Imaging and lab data was personally reviewed ECHOCARDIOGRAM COMPLETE  Result Date: 09/14/2022    ECHOCARDIOGRAM REPORT   Patient Name:   Micheal Levy Date of Exam: 09/14/2022 Medical Rec #:   [id number]       Height:       68.0 in Accession #:    [id number]      Weight:       270.3 lb Date of Birth:  1980-09-18       BSA:          2.323 m Patient Age:    42 years        BP:           221/144 mmHg Patient Gender: M               HR:           78 bpm. Exam Location:  Inpatient Procedure: 2D Echo, Color Doppler, Cardiac Doppler and Intracardiac            Opacification Agent Indications:    I50.31 Acute diastolic (congestive) heart failure  History:        Patient has prior history of Echocardiogram examinations, most                 recent 05/23/2022. CHF; Risk Factors:Hypertension, Diabetes and                 Dyslipidemia.  Sonographer:    Irving Burton Senior RDCS Referring Phys: [id number] ANASTASSIA DOUTOVA  Sonographer Comments: Technically difficult due to poor echo windows from lung interference (possible pneumonia) IMPRESSIONS  1. Left ventricular ejection fraction, by estimation, is 60 to 65%. The left ventricle has normal function. The left ventricle has no regional wall motion abnormalities. There is severe concentric left ventricular hypertrophy. Left ventricular diastolic  parameters are consistent with Grade III diastolic dysfunction (restrictive).  2. Right ventricular systolic function is normal. The right ventricular size is mildly enlarged. Tricuspid regurgitation signal is inadequate for assessing PA pressure.  3. Left atrial size was mildly dilated.  4. The mitral valve is normal in structure. Trivial mitral valve regurgitation.  5. The aortic valve is grossly normal. Aortic valve regurgitation is not visualized. No aortic stenosis is present.  6. The inferior vena cava is dilated in size with <50% respiratory variability, suggesting right atrial pressure of 15 mmHg. Comparison(s): No significant change from prior study. FINDINGS  Left Ventricle: Left ventricular ejection fraction, by estimation, is 60 to 65%. The left ventricle has normal function. The left ventricle has no regional wall motion  abnormalities. Definity contrast agent was given IV to delineate the left ventricular  endocardial borders. The left ventricular internal cavity size was normal in size. There is severe concentric left ventricular hypertrophy. Left ventricular diastolic parameters are consistent with Grade III diastolic dysfunction (restrictive). Right Ventricle: The right ventricular size is mildly enlarged. No increase in right ventricular wall thickness. Right ventricular systolic function is normal. Tricuspid regurgitation  signal is inadequate for assessing PA pressure. Left Atrium: Left atrial size was mildly dilated. Right Atrium: Right atrial size was not well visualized. Pericardium: Trivial pericardial effusion is present. Presence of epicardial fat layer. Mitral Valve: The mitral valve is normal in structure. Trivial mitral valve regurgitation. Tricuspid Valve: The tricuspid valve is normal in structure. Tricuspid valve regurgitation is trivial. Aortic Valve: The aortic valve is grossly normal. Aortic valve regurgitation is not visualized. No aortic stenosis is present. Pulmonic Valve: The pulmonic valve was grossly normal. Pulmonic valve regurgitation is not visualized. No evidence of pulmonic stenosis. Aorta: The aortic root and ascending aorta are structurally normal, with no evidence of dilitation. Venous: The inferior vena cava is dilated in size with less than 50% respiratory variability, suggesting right atrial pressure of 15 mmHg. IAS/Shunts: The interatrial septum was not well visualized.  LEFT VENTRICLE PLAX 2D LVIDd:         4.80 cm   Diastology LVIDs:         3.20 cm   LV e' medial:    3.48 cm/s LV PW:         1.80 cm   LV E/e' medial:  27.6 LV IVS:        1.60 cm   LV e' lateral:   4.35 cm/s LVOT diam:     2.10 cm   LV E/e' lateral: 22.1 LV SV:         66 LV SV Index:   28 LVOT Area:     3.46 cm  RIGHT VENTRICLE RV S prime:     12.80 cm/s TAPSE (M-mode): 2.6 cm LEFT ATRIUM             Index        RIGHT  ATRIUM           Index LA diam:        4.50 cm 1.94 cm/m   RA Area:     26.60 cm LA Vol (A2C):   98.5 ml 42.41 ml/m  RA Volume:   95.90 ml  41.29 ml/m LA Vol (A4C):   83.6 ml 35.99 ml/m LA Biplane Vol: 95.4 ml 41.07 ml/m  AORTIC VALVE LVOT Vmax:   117.00 cm/s LVOT Vmean:  85.100 cm/s LVOT VTI:    0.191 m  AORTA Ao Root diam: 3.40 cm Ao Asc diam:  3.60 cm MITRAL VALVE MV Area (PHT): 4.04 cm    SHUNTS MV Decel Time: 188 msec    Systemic VTI:  0.19 m MV E velocity: 96.10 cm/s  Systemic Diam: 2.10 cm MV A velocity: 38.60 cm/s MV E/A ratio:  2.49 Lennie Odor MD Electronically signed by Lennie Odor MD Signature Date/Time: 09/14/2022/11:40:05 AM    Final    DG Chest 2 View  Result Date: 09/13/2022 CLINICAL DATA:  Leg swelling and shortness of breath. EXAM: CHEST - 2 VIEW COMPARISON:  07/24/2022. FINDINGS: Heart is enlarged and the mediastinal contour is within normal limits. The pulmonary vasculature is distended. Diffuse hazy airspace opacities are noted in the lungs bilaterally. There is a small right pleural effusion. No pneumothorax. No acute osseous abnormality. IMPRESSION: 1. Cardiomegaly with pulmonary vascular congestion. 2. Diffuse hazy airspace opacities bilaterally suggesting edema. Electronically Signed   By: Thornell Sartorius M.D.   On: 09/13/2022 21:20    LOS: 1 day   Author: Calvert Cantor  09/15/2022 7:40 AM  To contact [facility]>   Check the care team in [facility] and look for the attending/consulting [facility] provider  listed  Log into [email or web address] and use [facility]'s universal password   Go to> "[facility]"  and find provider  If you still have difficulty reaching the provider, please page the [facility] (Director on Call) for the Hospitalists listed on amion

## 2022-09-15 NOTE — Consult Note (Signed)
Renal Service Consult Note [facility]  Micheal Levy 09/15/2022 Micheal Krabbe, MD Requesting Physician: Dr. Butler Levy  Reason for Consult: Renal failure, vol overload HPI: The patient is a 42 y.o. year-old w/ PMH as below who presented to ED 6/08 c/o leg swelling, wt gain and SOB. Taking his torsemide 80mg  qam and if needed takes a 2nd dose in the evening. In ED CXR shows bilat pulm edema, BP's very high at 240/ 163 initially. Creat 4.5 which is a bit higher than his usual CKD 4 w/ baseline creat 3.5- 4.0 range. Pt was admitted. We are asked to see for renal failure w/ vol overload.   Pt seen in room. He is not restricting fluids, he is drinking lots of water. Says the torsemide is not working like it used to, doesn't make him void like it used to. +orthopnea. His usual wt is around 235 lbs, or about 106kg.  Here he is weighed in at 268 lbs, or 122kg.  So he is up about 16kg.  Legs are swollen.  No N/V or confusion.   On his hosp stay in Feb 2024 he was quite vol overloaded and was diuresed from 120kg down to 102kg. In April 2024 was here for uncont HTN and was not really vol overloaded at 110kg, also had metapneumovirus infection probably responsible for some of his SOB. He improved and did not require much diuresis. We restarted his ARB (held in Feb) and that helped get his BP under control.   ROS - denies CP, no joint pain, no HA, no blurry vision, no rash, no diarrhea, no nausea/ vomiting, no dysuria, no difficulty voiding   Past Medical History  Past Medical History:  Diagnosis Date   Chronic kidney disease    Diabetes mellitus without complication (HCC)    Elevated serum cholesterol 04/2019   Elevated serum creatinine 04/2019   Hypertension    Hypertensive urgency 04/2019   Seizures (HCC) 03/2017   x 2    Stroke (HCC) 03/2017   Substance abuse (HCC)    Vitamin D deficiency 04/2019   Past Surgical History  Past Surgical History:  Procedure Laterality Date    RIGHT HEART CATH N/A 05/31/2021   Procedure: RIGHT HEART CATH;  Surgeon: Corky Crafts, MD;  Location: [facility] INVASIVE CV LAB;  Service: Cardiovascular;  Laterality: N/A;   Family History  Family History  Problem Relation Age of Onset   Diabetes Father    Heart attack Father    Diabetes Sister    Hypertension Brother    Social History  reports that he quit smoking about 23 years ago. His smoking use included cigarettes. He has never used smokeless tobacco. He reports current alcohol use. He reports current drug use. Drugs: Cocaine and Marijuana. Allergies No Known Allergies Home medications Prior to Admission medications   Medication Sig Start Date End Date Taking? Authorizing Provider  aspirin EC 81 MG tablet Take 1 tablet (81 mg total) by mouth daily. 05/30/22  Yes Sheikh, Omair Latif, DO  carvedilol (COREG) 25 MG tablet Take 25 mg by mouth 2 (two) times daily. 09/05/22  Yes [provider]  cloNIDine (CATAPRES) 0.3 MG tablet Take 1 tablet (0.3 mg total) by mouth 3 (three) times daily. 06/08/21  Yes [name], MD  fluticasone [facility]) 50 MCG/ACT nasal spray Place 2 sprays into both nostrils 2 (two) times daily. Patient taking differently: Place 2 sprays into both nostrils daily as needed for allergies. 05/30/22  Yes Sheikh,  Omair Latif, DO  hydrALAZINE (APRESOLINE) 100 MG tablet Take 1 tablet (100 mg total) by mouth every 8 (eight) hours. Patient taking differently: Take 100 mg by mouth 3 (three) times daily. 06/24/21  Yes [name], Kardie, DO  isosorbide mononitrate (IMDUR) 120 MG 24 hr tablet Take 1 tablet (120 mg total) by mouth daily. 06/09/21  Yes [name], MD  Multiple Vitamins-Minerals (MULTIVITAMIN ADULTS) TABS Take 1 tablet by mouth daily.   Yes [provider]  potassium chloride SA (KLOR-CON M) 20 MEQ tablet Take 40 mEq by mouth daily. 07/09/22  Yes [provider]  torsemide (DEMADEX) 20 MG tablet Take 80 mg by mouth daily.   Yes [provider]  albuterol (VENTOLIN HFA) 108 (90 Base) MCG/ACT inhaler Inhale 2 puffs into the lungs every 6 (six) hours as needed for wheezing or shortness of breath. Patient not taking: Reported on 09/13/2022 05/30/22   Marguerita Merles Latif, DO  Blood Glucose Monitoring Suppl (TRUE METRIX AIR GLUCOSE METER) DEVI 1 each by Does not apply route 4 (four) times daily -  before meals and at bedtime. 04/19/19   [name], FNP  glucose blood (TRUE METRIX BLOOD GLUCOSE TEST) test strip Use as instructed Patient taking differently: 1 each by Other route as directed. 04/19/19   [name], FNP  insulin glargine (LANTUS SOLOSTAR) 100 UNIT/ML Solostar Pen Inject 5 Units into the skin daily at 10 pm. 06/08/21 07/20/22  [name], MD  Insulin Pen Needle (PEN NEEDLES) 31G X 6 MM MISC 1 each by Does not apply route at bedtime. 10/05/17   Massie Maroon, FNP  INSULIN SYRINGE .5CC/29G (B-D INS SYR ULTRAFINE .5CC/29G) 29G X 1/2" 0.5 ML MISC 1 each by Does not apply route at bedtime. 04/02/17   Massie Maroon, FNP  Lancets MISC 1 each by Does not apply route 4 (four) times daily -  before meals and at bedtime. 04/19/19   [name], FNP  losartan (COZAAR) 25 MG tablet Take 2 tablets (50 mg total) by mouth daily. 07/25/22 08/24/22  Jerald Kief, MD  torsemide (DEMADEX) 20 MG tablet Take 4 tablets (80 mg total) by mouth daily for 30 doses. 07/25/22 08/24/22  Jerald Kief, MD     Vitals:   09/15/22 1200 09/15/22 1245 09/15/22 1316 09/15/22 1317  BP: (!) 160/121  (!) 186/107   Pulse: (!) 58 93  70  Resp: (!) 26 15  (!) 24  Temp: 98.5 F (36.9 C)     TempSrc: Oral     SpO2: 96% (S) 91%  95%  Weight:      Height:       Exam Gen alert, no distress No rash, cyanosis or gangrene Sclera anicteric, throat clear  No jvd or bruits Chest scant bibasilar rales, no wheezing, no ^wob RRR no MRG Abd soft ntnd no mass or ascites +bs GU normal male MS no joint effusions or deformity Ext 2+  bilat LE edema, no wounds or ulcers Neuro is alert, Ox 3 , nf     Home meds include - torsemide 80mg  qam w/ 2nd dose in pm prn, coreg 25 bid, asa, clonidine 0.3 tid, hydralazine 100 tid, imdur 120 qd, MVI, klorcon 40 qd, insulin glargine, losartan 50 qd, prns/ vits/ supps     Date                          Creat  eGFR    2017                         1.23    2018                         1.30- 2.86    2019                         1.22- 1.30    2021                         1.59- 2.75    2022                         1.65- 2.92    Jan 2023                  1.80- 2.73    2/01- 05/11/21            3.03- 3.35 23- 25 ml/min    2/11- 06/08/21            1.54- 4.15 17- 33 ml/min    06/24/21                     2.45         33 ml/min       Sept 2023                 2.41- 2.76        28- 33 ml/min    2/15- 05/30/22             3.40- 4.61       15- 22 ml/min    4/14- 07/25/22   4.58 >> 3.77  16- 19 ml/min    09/13/22    4.45   16 ml/min    6/09     4.35    09/15/22    4.35   16 ml/min        UA 6/8 - 0-5 rbc/ wbc, prot 100     CXR - bilat pulm edema   Assessment/ Plan: SOB/ volume overload/ LE edema/ pulm edema - needs aggressive diuresis and BP control. His renal failure is worsening. Corrected him on fluid intake, needs fluid restriction 1200 cc /d.  Will need to adjust/ increase his torsemide dosing upon discharge.  CKD IV - b/l creat is 3.8- 4.5 from April 2024, eGFR 16-19 ml/min. Creat here is 4.3 in setting of recurrent volume overload. Renal function continues to decline.  HTN'sive urgency - BP 240/160 in ED, down to 180s/ 107 today. Cont medications, hopefully diuresing will help.  IDDM      [name]  MD [facility] 09/15/2022, 1:29 PM  Recent Labs  Lab 09/13/22 2054 09/14/22 0158 09/14/22 1517  HGB 11.2* 10.4*  --   ALBUMIN  --  3.0*  2.8*  --   CALCIUM 8.3* 8.0* 8.3*  PHOS 4.0 3.9  --   CREATININE 4.45* 4.35* 4.35*  K 4.0 3.6 3.4*   Inpatient medications:  amLODipine   10 mg Oral Daily   aspirin EC  81 mg Oral Daily   carvedilol  25 mg Oral BID   Chlorhexidine Gluconate Cloth  6 each Topical Daily   cloNIDine  0.3 mg Oral TID   furosemide  80 mg Intravenous  Q12H   hydrALAZINE  100 mg Oral TID   insulin aspart  0-5 Units Subcutaneous QHS   insulin aspart  0-9 Units Subcutaneous TID WC   insulin glargine-yfgn  5 Units Subcutaneous QHS   isosorbide mononitrate  120 mg Oral Daily   sodium chloride flush  3 mL Intravenous Q12H    sodium chloride     sodium chloride, acetaminophen **OR** acetaminophen, albuterol, hydrALAZINE, HYDROcodone-acetaminophen, ondansetron **OR** ondansetron (ZOFRAN) IV, sodium chloride flush

## 2022-09-15 NOTE — Progress Notes (Signed)
Renal artery duplex has been completed. Preliminary results can be found in CV Proc through chart review.   09/15/22 10:13 AM Olen Cordial RVT

## 2022-09-15 NOTE — Progress Notes (Signed)
Initial Nutrition Assessment  DOCUMENTATION CODES:   Obesity unspecified  INTERVENTION:  - Carb Modified with fluid restriction per MD.  - Heart Healthy Nutrition Therapy diet education provided with handout.   Nutrition status stable at this time, will sign-off. Please re-consult if needed.   NUTRITION DIAGNOSIS:   Increased nutrient needs related to acute illness as evidenced by estimated needs.  GOAL:   Patient will meet greater than or equal to 90% of their needs  MONITOR:   PO intake  REASON FOR ASSESSMENT:   Consult Assessment of nutrition requirement/status  ASSESSMENT:   42 year old male with congestive heart failure, CKD, diabetes mellitus and HTN who presented for shortness of breath, orthopnea and swelling of her lower extremities. Admitted for acute CHF.   Patient reports a UBW of 230# and weight changes recently due to fluid. Per EMR, weight has fluctuated between 237-268# over the past year.   Endorses eating well at home, usually 2 meals a day (lunch and dinner). Admits appetite has been slightly decreased recently but appetite has improved since admission. Patient reports eating well with 3 meals a day.   Patient requesting heart healthy diet education. RD provided "Heart Healthy Nutrition Therapy" handout from the [employer]. Provided examples on ways to decrease sodium intake in diet. Discouraged intake of processed foods and use of salt shaker. Encouraged fresh fruits and vegetables as well as whole grain sources of carbohydrates to maximize fiber intake.  Patient appreciative of information.  Nutrition status stable at this time, will sign off.   Medications reviewed and include: Lasix, Insulin  Labs reviewed:  K+ 3.4 Creatinine 4.35   NUTRITION - FOCUSED PHYSICAL EXAM:  No depletions  Diet Order:   Diet Order             Diet NPO time specified  Diet effective midnight           Diet Carb Modified  Fluid consistency: Thin; Room service appropriate? Yes; Fluid restriction: 1200 mL Fluid  Diet effective now                   EDUCATION NEEDS:  Education needs have been addressed  Skin:  Skin Assessment: Reviewed RN Assessment  Last BM:  6/8  Height:  Ht Readings from Last 1 Encounters:  09/14/22 5\' 8"  (1.727 m)   Weight:  Wt Readings from Last 1 Encounters:  09/15/22 121.9 kg    BMI:  Body mass index is 40.86 kg/m.  Estimated Nutritional Needs:  Kcal:  2100-2400 kcals Protein:  100-120 grams Fluid:  >/= 2.1L    Shelle Iron RD, LDN For contact information, refer to [facility].

## 2022-09-16 DIAGNOSIS — I5031 Acute diastolic (congestive) heart failure: Secondary | ICD-10-CM | POA: Diagnosis not present

## 2022-09-16 LAB — BASIC METABOLIC PANEL
Anion gap: 9 (ref 5–15)
BUN: 37 mg/dL — ABNORMAL HIGH (ref 6–20)
CO2: 23 mmol/L (ref 22–32)
Calcium: 8.5 mg/dL — ABNORMAL LOW (ref 8.9–10.3)
Chloride: 109 mmol/L (ref 98–111)
Creatinine, Ser: 4.32 mg/dL — ABNORMAL HIGH (ref 0.61–1.24)
GFR, Estimated: 16 mL/min — ABNORMAL LOW (ref >60)
Glucose, Bld: 177 mg/dL — ABNORMAL HIGH (ref 70–99)
Potassium: 3.3 mmol/L — ABNORMAL LOW (ref 3.5–5.1)
Sodium: 141 mmol/L (ref 135–145)

## 2022-09-16 LAB — MAGNESIUM: Magnesium: 2.3 mg/dL (ref 1.7–2.4)

## 2022-09-16 LAB — GLUCOSE, CAPILLARY: Glucose-Capillary: 118 mg/dL — ABNORMAL HIGH (ref 70–99)

## 2022-09-16 MED ORDER — HYDRALAZINE HCL 20 MG/ML IJ SOLN
10.0000 mg | INTRAMUSCULAR | Status: DC | PRN
  Administered 2022-09-16 – 2022-09-18 (×2): 10 mg via INTRAVENOUS
  Filled 2022-09-16 (×2): qty 1

## 2022-09-16 MED ORDER — POTASSIUM CHLORIDE CRYS ER 20 MEQ PO TBCR
40.0000 meq | EXTENDED_RELEASE_TABLET | ORAL | Status: AC
  Administered 2022-09-16 (×2): 40 meq via ORAL
  Filled 2022-09-16 (×2): qty 2

## 2022-09-16 NOTE — Progress Notes (Signed)
[facility] Progress Note  Subjective: pt seen in room.  4 L UOP yesterday, wt's down from 122kg to 120 kg.   Vitals:   09/16/22 1200 09/16/22 1300 09/16/22 1400 09/16/22 1600  BP: (!) 157/98 (!) 162/87 (!) 155/49 (!) 175/90  Pulse: (!) 56 (!) 56 65 (!) 54  Resp: (!) 21 (!) 26 (!) 21 20  Temp: 97.9 F (36.6 C)     TempSrc: Oral     SpO2: 97% 98% 98% 99%  Weight:      Height:        Exam: Gen alert, no distress No rash, cyanosis or gangrene Sclera anicteric, throat clear  No jvd or bruits Chest scant bibasilar rales, no wheezing, no ^wob RRR no MRG Abd soft ntnd no mass or ascites +bs GU normal male MS no joint effusions or deformity Ext 2+ bilat LE edema, no wounds or ulcers Neuro is alert, Ox 3 , nf        Home meds include - torsemide 80mg  qam w/ 2nd dose in pm prn, coreg 25 bid, asa, clonidine 0.3 tid, hydralazine 100 tid, imdur 120 qd, MVI, klorcon 40 qd, insulin glargine, losartan 50 qd, prns/ vits/ supps      Date                          Creat               eGFR    2017                         1.23    2018                         1.30- 2.86    2019                         1.22- 1.30    2021                         1.59- 2.75    2022                         1.65- 2.92    Jan 2023                  1.80- 2.73    2/01- 05/11/21            3.03- 3.35       23- 25 ml/min    2/11- 06/08/21            1.54- 4.15       17- 33 ml/min    06/24/21                     2.45                     33 ml/min       Sept 2023                 2.41- 2.76        28- 33 ml/min    2/15- 05/30/22             3.40- 4.61       15- 22 ml/min    4/14- 07/25/22  4.58 >> 3.77   16- 19 ml/min    09/13/22                       4.45                16 ml/min    6/09                            4.35    09/15/22                       4.35                16 ml/min             UA 6/8 - 0-5 rbc/ wbc, prot 100     CXR - bilat pulm edema    Assessment/ Plan: SOB/ volume  overload/ LE edema/ pulm edema - needs aggressive diuresis and BP control. His renal failure is worsening and is now CKD 4, pushing CKD 5. Have counseled him on that the fact that dialysis is inevitable. Corrected him that he should not be pushing fluids at home, but rather restricting fluid intake as his renal function continues to drop. Would recommend 1200 cc /d max.  Will also need to adjust/ increase his torsemide dosing upon discharge. For now is diuresing very will, need 3- 4 L UOP per day ideally. Goal wts are around 106- 110 kg.  CKD IV - b/l creat is 3.8- 4.5 from April 2024, eGFR 16-19 ml/min. Creat here is 4.3 in setting of recurrent volume overload. Renal function stable today. Approaching esrd but no uremics signs at this time.  HTN'sive urgency - BP 240/160 in ED, down to 180s/ 107 today. Cont medications, hopefully diuresing will help.  IDDM     [name] MD [facility] 09/16/2022, 4:27 PM  Recent Labs  Lab 09/13/22 2054 09/14/22 0158 09/14/22 1517 09/16/22 0311  HGB 11.2* 10.4*  --   --   ALBUMIN  --  3.0*  2.8*  --   --   CALCIUM 8.3* 8.0* 8.3* 8.5*  PHOS 4.0 3.9  --   --   CREATININE 4.45* 4.35* 4.35* 4.32*  K 4.0 3.6 3.4* 3.3*   Recent Labs  Lab 09/14/22 0400  IRON 58  TIBC 311  FERRITIN 57   Inpatient medications:  amLODipine  10 mg Oral Daily   aspirin EC  81 mg Oral Daily   carvedilol  25 mg Oral BID   Chlorhexidine Gluconate Cloth  6 each Topical Daily   cloNIDine  0.3 mg Oral TID   hydrALAZINE  100 mg Oral TID   insulin aspart  0-5 Units Subcutaneous QHS   insulin aspart  0-9 Units Subcutaneous TID WC   insulin glargine-yfgn  5 Units Subcutaneous QHS   isosorbide mononitrate  120 mg Oral Daily   sodium chloride flush  3 mL Intravenous Q12H    sodium chloride     furosemide Stopped (09/16/22 0554)   sodium chloride, acetaminophen **OR** acetaminophen, albuterol, hydrALAZINE, ondansetron **OR** ondansetron (ZOFRAN) IV, sodium chloride flush

## 2022-09-16 NOTE — Progress Notes (Signed)
[facility] Progress Note  Patient: Micheal Levy     WUJ:[id number]  DOA: 09/13/2022   PCP: Barbette Merino, NP       Brief hospital course: This is a 42 year old male with, congestive heart failure, CKD, diabetes mellitus and hypertension who presents to the hospital for shortness of breath, orthopnea and swelling of her lower extremities.  She states she is taking all her medications at home but despite this has been worsening. In the ED, blood pressure noted to be significantly elevated with systolics mostly in the 200s and diastolics in the 140s. Chest x-ray consistent with pulmonary edema, BNP 1149, creatinine 4.45 and troponin 43. She was admitted for hypertensive emergency  Subjective:  He has no complaints.  Assessment and Plan: Principal Problem:   Acute diastolic CHF (congestive heart failure) (HCC)/hypertensive emergency - 2D echo reveals grade 2 diastolic dysfunction with severe concentric LVH - Continue Lasix, institute fluid restriction and monitor strict I and O -Control BP-continue Catapres, carvedilol, amlodipine, Imdur, hydralazine and as needed IV Hydralazine - renal artery duplex does not reveal RAS as the cause of HTN - appreciate nephro consult- Nephrology recommends continuing aggressive diuresis and increasing home dose of Torsemide.   Active problems:  AKI with CKD (chronic kidney disease) stage 4, GFR 15-29 ml/min (HCC) -Creatinine slightly elevated from baseline which appears to be 3.7-3.9 - see above for plan  Hypokalemia - from Lasix - replacing - Mg normal at 2.3  Elevated troponin - Possibly coronary strain in addition to elevated creatinine    Insulin-requiring or dependent type II diabetes mellitus (HCC) -Continue Semglee and sliding scale insulin - CBGs< 150 in hospital  Morbid obesity Body mass index is 40.36 kg/m.        Code Status: Full Code Consultants: Nephro Level of Care: Level of care: Progressive Total time  on patient care: 30 min DVT prophylaxis:  SCDs Start: 09/13/22 2302     Objective:   Vitals:   09/16/22 0800 09/16/22 0900 09/16/22 1100 09/16/22 1200  BP: (!) 189/111 (!) 183/104 (!) 149/86 (!) 157/98  Pulse: 62 70 (!) 55 (!) 56  Resp: (!) 22 (!) 23 19 (!) 21  Temp: 98.6 F (37 C)   97.9 F (36.6 C)  TempSrc: Oral   Oral  SpO2: 98% 97% 99% 97%  Weight:      Height:       Filed Weights   09/14/22 0856 09/15/22 0429 09/16/22 0500  Weight: 122.6 kg 121.9 kg 120.4 kg   Exam: General exam: Appears comfortable  HEENT: oral mucosa moist Respiratory system: Clear to auscultation.  Cardiovascular system: S1 & S2 heard  Gastrointestinal system: Abdomen soft, non-tender, nondistended. Normal bowel sounds   Extremities: No cyanosis, clubbing - pedal edema has resolved Psychiatry:  Mood & affect appropriate.     CBC: Recent Labs  Lab 09/13/22 2054 09/14/22 0158  WBC 6.3 6.9  HGB 11.2* 10.4*  HCT 36.1* 33.0*  MCV 86.0 84.8  PLT 259 275    Basic Metabolic Panel: Recent Labs  Lab 09/13/22 2054 09/14/22 0158 09/14/22 1517 09/16/22 0307 09/16/22 0311  NA 138 136 138  --  141  K 4.0 3.6 3.4*  --  3.3*  CL 107 106 108  --  109  CO2 20* 20* 22  --  23  GLUCOSE 141* 151* 157*  --  177*  BUN 34* 33* 34*  --  37*  CREATININE 4.45* 4.35* 4.35*  --  4.32*  CALCIUM 8.3*  8.0* 8.3*  --  8.5*  MG 2.3 2.0  --  2.3  --   PHOS 4.0 3.9  --   --   --     GFR: Estimated Creatinine Clearance: 27.5 mL/min (A) (by C-G formula based on SCr of 4.32 mg/dL (H)).  Scheduled Meds:  amLODipine  10 mg Oral Daily   aspirin EC  81 mg Oral Daily   carvedilol  25 mg Oral BID   Chlorhexidine Gluconate Cloth  6 each Topical Daily   cloNIDine  0.3 mg Oral TID   hydrALAZINE  100 mg Oral TID   insulin aspart  0-5 Units Subcutaneous QHS   insulin aspart  0-9 Units Subcutaneous TID WC   insulin glargine-yfgn  5 Units Subcutaneous QHS   isosorbide mononitrate  120 mg Oral Daily   sodium  chloride flush  3 mL Intravenous Q12H   Continuous Infusions:  sodium chloride     furosemide Stopped (09/16/22 0554)   Imaging and lab data was personally reviewed VAS US RENAL ARTERY DUPLEX  Result Date: 09/15/2022 ABDOMINAL VISCERAL Patient Name:  Micheal Levy  Date of Exam:   09/15/2022 Medical Rec #: [id number]        Accession #:    [id number] Date of Birth: 1980-04-17        Patient Gender: M Patient Age:   69 years Exam Location:  [facility] Procedure:      VAS US RENAL ARTERY DUPLEX Referring Phys: [id number] Kannon Granderson -------------------------------------------------------------------------------- Indications: Hypertension High Risk Factors: Hypertension. Limitations: Obesity, air/bowel gas, patient discomfort and patient positioning, patient movement, respiratory disturbance. Comparison Study: 05/21/2021 - Right: Patent renal artery without significant                   stenosis. Abnormal                   right Resistive Index. Normal size right kidney.                   Left: Patent renal artery without significant stenosis.                   Abnormal                   left Resisitve Index. Normal size of left kidney. Performing Technologist: Chanda Busing RVT  Examination Guidelines: A complete evaluation includes B-mode imaging, spectral Doppler, color Doppler, and power Doppler as needed of all accessible portions of each vessel. Bilateral testing is considered an integral part of a complete examination. Limited examinations for reoccurring indications may be performed as noted.  Duplex Findings: +----------------------+--------+--------+------+--------+ Mesenteric            PSV cm/sEDV cm/sPlaqueComments +----------------------+--------+--------+------+--------+ Aorta Mid                88      19                  +----------------------+--------+--------+------+--------+ Celiac Artery Origin     92                           +----------------------+--------+--------+------+--------+ Celiac Artery Proximal  141      12                  +----------------------+--------+--------+------+--------+    +------------------+--------+--------+-------+ Right Renal ArteryPSV cm/sEDV cm/sComment +------------------+--------+--------+-------+ Origin  27      8            +------------------+--------+--------+-------+ Proximal             30      7            +------------------+--------+--------+-------+ Mid                  21      6            +------------------+--------+--------+-------+ Distal               20      5            +------------------+--------+--------+-------+ +-----------------+--------+--------+-------+ Left Renal ArteryPSV cm/sEDV cm/sComment +-----------------+--------+--------+-------+ Origin              33      7            +-----------------+--------+--------+-------+ Proximal            43      8            +-----------------+--------+--------+-------+ Mid                 49      12           +-----------------+--------+--------+-------+ Distal              19      4            +-----------------+--------+--------+-------+ +------------+--------+--------+----+-----------+--------+--------+----+ Right KidneyPSV cm/sEDV cm/sRI  Left KidneyPSV cm/sEDV cm/sRI   +------------+--------+--------+----+-----------+--------+--------+----+ Upper Pole  12      6       0.51Upper Pole 9       4       0.60 +------------+--------+--------+----+-----------+--------+--------+----+ Mid         11      5       0.        12      5       0.57 +------------+--------+--------+----+-----------+--------+--------+----+ Lower Pole  13      7       0.48Lower Pole 10      4       0.60 +------------+--------+--------+----+-----------+--------+--------+----+ Hilar       22      6       0.74Hilar      23      5       0.78  +------------+--------+--------+----+-----------+--------+--------+----+ +------------------+-----+------------------+-----+ Right Kidney           Left Kidney             +------------------+-----+------------------+-----+ RAR                    RAR                     +------------------+-----+------------------+-----+ RAR (manual)      0.31 RAR (manual)      0.56  +------------------+-----+------------------+-----+ Cortex                 Cortex                  +------------------+-----+------------------+-----+ Cortex thickness       Corex thickness         +------------------+-----+------------------+-----+ Kidney length (cm)11.00Kidney length (cm)10.00 +------------------+-----+------------------+-----+  Summary: Renal:  Right: Normal size right kidney. Normal right Resisitive Index. No        evidence of  right renal artery stenosis. Left:  No evidence of left renal artery stenosis. Normal left        Resistive Index. Normal size of left kidney.  *See table(s) above for measurements and observations.  Diagnosing physician: Lemar Livings MD  Electronically signed by Lemar Livings MD on 09/15/2022 at 2:30:52 PM.    Final     LOS: 2 days   Author: Calvert Cantor  09/16/2022 1:46 PM  To contact [facility]>   Check the care team in [facility] and look for the attending/consulting [facility] provider listed  Log into [email or web address] and use [facility]'s universal password   Go to> "[facility]"  and find provider  If you still have difficulty reaching the provider, please page the [facility] (Director on Call) for the Hospitalists listed on amion

## 2022-09-16 NOTE — Plan of Care (Signed)
  Problem: Activity: Goal: Capacity to carry out activities will improve Outcome: Progressing   Problem: Education: Goal: Knowledge of General Education information will improve Description: Including pain rating scale, medication(s)/side effects and non-pharmacologic comfort measures Outcome: Progressing   Problem: Activity: Goal: Risk for activity intolerance will decrease Outcome: Progressing   Problem: Coping: Goal: Level of anxiety will decrease Outcome: Progressing   Problem: Elimination: Goal: Will not experience complications related to urinary retention Outcome: Progressing   Problem: Pain Managment: Goal: General experience of comfort will improve Outcome: Progressing   Problem: Safety: Goal: Ability to remain free from injury will improve Outcome: Progressing   Problem: Skin Integrity: Goal: Risk for impaired skin integrity will decrease Outcome: Progressing   

## 2022-09-17 DIAGNOSIS — I5031 Acute diastolic (congestive) heart failure: Secondary | ICD-10-CM | POA: Diagnosis not present

## 2022-09-17 LAB — GLUCOSE, CAPILLARY
Glucose-Capillary: 115 mg/dL — ABNORMAL HIGH (ref 70–99)
Glucose-Capillary: 112 mg/dL — ABNORMAL HIGH (ref 70–99)
Glucose-Capillary: 123 mg/dL — ABNORMAL HIGH (ref 70–99)
Glucose-Capillary: 137 mg/dL — ABNORMAL HIGH (ref 70–99)
Glucose-Capillary: 166 mg/dL — ABNORMAL HIGH (ref 70–99)
Glucose-Capillary: 142 mg/dL — ABNORMAL HIGH (ref 70–99)
Glucose-Capillary: 122 mg/dL — ABNORMAL HIGH (ref 70–99)
Glucose-Capillary: 153 mg/dL — ABNORMAL HIGH (ref 70–99)
Glucose-Capillary: 122 mg/dL — ABNORMAL HIGH (ref 70–99)

## 2022-09-17 MED ORDER — FUROSEMIDE 10 MG/ML IJ SOLN
120.0000 mg | Freq: Three times a day (TID) | INTRAVENOUS | Status: DC
  Administered 2022-09-17 – 2022-09-18 (×4): 120 mg via INTRAVENOUS
  Filled 2022-09-17: qty 12
  Filled 2022-09-17 (×4): qty 10

## 2022-09-17 MED ORDER — HEPARIN SODIUM (PORCINE) 5000 UNIT/ML IJ SOLN
5000.0000 [IU] | Freq: Three times a day (TID) | INTRAMUSCULAR | Status: DC
  Administered 2022-09-17 – 2022-09-20 (×9): 5000 [IU] via SUBCUTANEOUS
  Filled 2022-09-17 (×9): qty 1

## 2022-09-17 NOTE — TOC Initial Note (Addendum)
Transition of Care [facility]) - Initial/Assessment Note    Patient Details  Name: Micheal Levy MRN: [id number] Date of Birth: [birth year]  [facility] CM/SW Contact:    Howell Rucks, RN Phone Number: 09/17/2022, 8:50 AM  Clinical Narrative:   [facility] consult received for Heart Failure Navigation Team and SNF Placement. Pt admitting dx: CHF-HF consult entered. Teams chat to MD to enter PT eval , await recommendation. TOC will continue to follow.      -1:33pm  PT eval completed, no recommendations. TOC will continue to follow.               Patient Goals and CMS Choice            Expected Discharge Plan and Services                                              Prior Living Arrangements/Services                       Activities of Daily Living Home Assistive Devices/Equipment: Scales, BIPAP ADL Screening (condition at time of admission) Patient's cognitive ability adequate to safely complete daily activities?: Yes Is the patient deaf or have difficulty hearing?: No Does the patient have difficulty seeing, even when wearing glasses/contacts?: No Does the patient have difficulty concentrating, remembering, or making decisions?: No Patient able to express need for assistance with ADLs?: Yes Does the patient have difficulty dressing or bathing?: No Independently performs ADLs?: Yes (appropriate for developmental age) Does the patient have difficulty walking or climbing stairs?: No Weakness of Legs: None Weakness of Arms/Hands: None  Permission Sought/Granted                  Emotional Assessment              Admission diagnosis:  Acute on chronic diastolic congestive heart failure (HCC) [I50.33] Hypertensive emergency [I16.1] Acute diastolic CHF (congestive heart failure) (HCC) [I50.31] Malignant hypertension [I10] Patient Active Problem List   Diagnosis Date Noted   Malignant hypertension 09/14/2022   Bilateral pneumonia  07/20/2022   Acute on chronic heart failure with preserved ejection fraction (HFpEF) (HCC) 12/22/2021   CKD (chronic kidney disease) stage 4, GFR 15-29 ml/min (HCC) 06/24/2021   Insulin-requiring or dependent type II diabetes mellitus (HCC) 06/24/2021   Hyperlipidemia associated with type 2 diabetes mellitus (HCC) 06/24/2021   Hyperkalemia 06/01/2021   Restrictive cardiomyopathy (HCC) 05/29/2021   Pulmonary hypertension (HCC) 05/29/2021   Normocytic anemia 05/29/2021   Obesity (BMI 30-39.9) 05/29/2021   Acute on chronic diastolic CHF (congestive heart failure) (HCC) 05/08/2021   Acute streptococcal tonsillitis 04/22/2021   Chronic diastolic heart failure (HCC) 04/22/2021   Hypertension associated with diabetes (HCC) 02/26/2021   Medication management 02/26/2021   Class 2 obesity 02/26/2021   CHF exacerbation (HCC) 02/20/2021   Type 2 diabetes mellitus with other specified complication (HCC) 02/20/2021   Dyslipidemia 02/20/2021   Abnormal EKG 02/20/2021   Acute respiratory failure with hypoxia (HCC) 01/12/2021   Chronic kidney disease, stage 3a (HCC)    Acute diastolic CHF (congestive heart failure) (HCC)    Elevated troponin    History of stroke    Ankle fracture, bimalleolar, closed, left, initial encounter 01/18/2020   Seizures (HCC) 09/04/2019   Altered mental status    Hypertensive urgency 04/21/2019  Essential hypertension 04/21/2019   Uncontrolled type 2 diabetes mellitus with hyperglycemia (HCC) 04/21/2019   Hemoglobin A1C between 7% and 9% indicating borderline diabetic control (HCC) 04/21/2019   Incarceration 04/21/2019   History of seizures 04/21/2019   Hypokalemia 04/21/2019   History of substance abuse (HCC) 04/05/2017   Cerebral embolism with cerebral infarction 03/23/2017   AKI (acute kidney injury) [facility])    Hypertensive emergency    Ventilator dependent (HCC)    Hyperglycemia 03/21/2017   PCP:  Barbette Merino, NP Pharmacy:   [facility] [phone] -  [place], [place] - [address] AT [facility] [address] [place] [address] Phone: [phone] Fax: [phone]  [id number] - [address], [place] - [address] [place] [address] Phone: [phone] Fax: [phone]  [facility] [address] [place] [address] Phone: [phone] Fax: [phone]     Social Determinants of Health (SDOH) Social History: SDOH Screenings   Food Insecurity: No Food Insecurity (09/14/2022)  Housing: Low Risk  (09/14/2022)  Transportation Needs: No Transportation Needs (09/14/2022)  Utilities: Not At Risk (09/14/2022)  Depression (PHQ2-9): Low Risk  (11/11/2019)  Tobacco Use: Medium Risk (09/13/2022)   SDOH Interventions:     Readmission Risk Interventions    09/17/2022    8:49 AM 07/22/2022    9:13 AM 05/30/2022    1:15 PM  Readmission Risk Prevention Plan  Transportation Screening Complete Complete Complete  PCP or Specialist Appt within 3-5 Days Complete Complete Complete  HRI or Home Care Consult Complete Complete Complete  Social Work Consult for Recovery Care Planning/Counseling Complete Complete Complete  Palliative Care Screening Not Applicable Not Applicable Not Applicable  Medication Review Oceanographer) Complete Complete Complete

## 2022-09-17 NOTE — Evaluation (Signed)
Physical Therapy Evaluation Patient Details Name: Micheal Levy MRN: [id number] DOB: 23-Jun-1980 Today's Date: 09/17/2022  History of Present Illness  42 yo male presents to ED on 09/13/2022 due to B LE edema and SOB. Pt hospitalized 07/2022 due to PNA. Pt was found to have acute dCHF, hypertensive urgency with BP 240/160 in ED,  volume overload, pulmonary edema and MD recommendations for diuresis and BP control. Concern per ESRD and need for HD. Pt PMH includes but is not limited to: DM II, CVA (2018), CKD IV, chronic anemia, HDL, HTN, and seizures.  Clinical Impression    Pt admitted with above diagnosis.  Pt currently with functional limitations due to the deficits listed below (see PT Problem List). Pt in bed when PT arrived. No reports of pain, dizziness, or SOB with pt 92% on RA at rest and 90% with exertion. Pt is IND with bed mobility, transfer tasks and gait 150 feet no AD. No reports of falls and no indication of LOB with functional mobility tasks. B distal LE edema remains, pt indicates much better than at time of arrival to hospital. Pt states he plans to take a week off of work. Pt does not require any further PT intervention in current or next venue. Pt is aware and in agreement.      Recommendations for follow up therapy are one component of a multi-disciplinary discharge planning process, led by the attending physician.  Recommendations may be updated based on patient status, additional functional criteria and insurance authorization.  Follow Up Recommendations       Assistance Recommended at Discharge    Patient can return home with the following  Assist for transportation;Assistance with cooking/housework    Equipment Recommendations None recommended by PT  Recommendations for Other Services       Functional Status Assessment Patient has had a recent decline in their functional status and demonstrates the ability to make significant improvements in function in a reasonable  and predictable amount of time.     Precautions / Restrictions Precautions Precautions: Fall Restrictions Weight Bearing Restrictions: No      Mobility  Bed Mobility Overal bed mobility: Independent                  Transfers Overall transfer level: Independent                      Ambulation/Gait Ambulation/Gait assistance: Modified independent (Device/Increase time) Gait Distance (Feet): 150 Feet Assistive device: None Gait Pattern/deviations: Step-through pattern, Wide base of support Gait velocity: slight decrease        Stairs            Wheelchair Mobility    Modified Rankin (Stroke Patients Only)       Balance Overall balance assessment: No apparent balance deficits (not formally assessed) (no hx of falls)                                           Pertinent Vitals/Pain Pain Assessment Pain Assessment: No/denies pain    Home Living Family/patient expects to be discharged to:: Private residence Living Arrangements: Spouse/significant other;Children Available Help at Discharge: Family Type of Home: House Home Access: Stairs to enter Entrance Stairs-Rails: Right;Left;Can reach both Entrance Stairs-Number of Steps: 3   Home Layout: Two level;Able to live on main level with bedroom/bathroom Home Equipment: None  Prior Function Prior Level of Function : Independent/Modified Independent;Working/employed;Driving             Mobility Comments: landscaper, no AD, IND with all ADLs and IADLs       Hand Dominance        Extremity/Trunk Assessment        Lower Extremity Assessment Lower Extremity Assessment: Overall WFL for tasks assessed (B LE pitting edema dorsal surface of feet, pt indicates much better than at time of admission)    Cervical / Trunk Assessment Cervical / Trunk Assessment: Normal  Communication   Communication: No difficulties  Cognition Arousal/Alertness:  Awake/alert Behavior During Therapy: WFL for tasks assessed/performed Overall Cognitive Status: Within Functional Limits for tasks assessed                                          General Comments      Exercises     Assessment/Plan    PT Assessment Patient does not need any further PT services  PT Problem List         PT Treatment Interventions      PT Goals (Current goals can be found in the Care Plan section)  Acute Rehab PT Goals Patient Stated Goal: to go home and return to work in a week PT Goal Formulation: With patient Time For Goal Achievement: 10/01/22 Potential to Achieve Goals: Good    Frequency       Co-evaluation               AM-PAC PT "6 Clicks" Mobility  Outcome Measure Help needed turning from your back to your side while in a flat bed without using bedrails?: None Help needed moving from lying on your back to sitting on the side of a flat bed without using bedrails?: None Help needed moving to and from a bed to a chair (including a wheelchair)?: None Help needed standing up from a chair using your arms (e.g., wheelchair or bedside chair)?: None Help needed to walk in hospital room?: A Little Help needed climbing 3-5 steps with a railing? : A Little 6 Click Score: 22    End of Session Equipment Utilized During Treatment: Gait belt Activity Tolerance: Patient tolerated treatment well Patient left: in bed;with call bell/phone within reach Nurse Communication: Mobility status      Time: 1038-1050 PT Time Calculation (min) (ACUTE ONLY): 12 min   Charges:   PT Evaluation $PT Eval Low Complexity: 1 Low          Johnny Bridge, PT Acute Rehab   Jacqualyn Posey 09/17/2022, 11:33 AM

## 2022-09-17 NOTE — Plan of Care (Signed)
  Problem: Education: Goal: Ability to describe self-care measures that may prevent or decrease complications (Diabetes Survival Skills Education) will improve Outcome: Progressing Goal: Individualized Educational Video(s) Outcome: Not Applicable   Problem: Coping: Goal: Ability to adjust to condition or change in health will improve Outcome: Progressing   Problem: Fluid Volume: Goal: Ability to maintain a balanced intake and output will improve Outcome: Progressing   Problem: Health Behavior/Discharge Planning: Goal: Ability to manage health-related needs will improve Outcome: Progressing   Problem: Metabolic: Goal: Ability to maintain appropriate glucose levels will improve Outcome: Progressing   Problem: Nutritional: Goal: Maintenance of adequate nutrition will improve Outcome: Progressing Goal: Progress toward achieving an optimal weight will improve Outcome: Progressing   Problem: Skin Integrity: Goal: Risk for impaired skin integrity will decrease Outcome: Progressing   Problem: Tissue Perfusion: Goal: Adequacy of tissue perfusion will improve Outcome: Progressing   Problem: Education: Goal: Ability to demonstrate management of disease process will improve Outcome: Progressing Goal: Ability to verbalize understanding of medication therapies will improve Outcome: Progressing Goal: Individualized Educational Video(s) Outcome: Not Applicable   Problem: Activity: Goal: Capacity to carry out activities will improve Outcome: Progressing   Problem: Cardiac: Goal: Ability to achieve and maintain adequate cardiopulmonary perfusion will improve Outcome: Progressing   Problem: Education: Goal: Knowledge of General Education information will improve Description: Including pain rating scale, medication(s)/side effects and non-pharmacologic comfort measures Outcome: Progressing   Problem: Health Behavior/Discharge Planning: Goal: Ability to manage health-related  needs will improve Outcome: Progressing   Problem: Clinical Measurements: Goal: Ability to maintain clinical measurements within normal limits will improve Outcome: Progressing Goal: Will remain free from infection Outcome: Progressing Goal: Diagnostic test results will improve Outcome: Progressing Goal: Respiratory complications will improve Outcome: Progressing Goal: Cardiovascular complication will be avoided Outcome: Progressing   Problem: Activity: Goal: Risk for activity intolerance will decrease Outcome: Progressing   Problem: Nutrition: Goal: Adequate nutrition will be maintained Outcome: Progressing   Problem: Coping: Goal: Level of anxiety will decrease Outcome: Progressing   Problem: Elimination: Goal: Will not experience complications related to bowel motility Outcome: Progressing Goal: Will not experience complications related to urinary retention Outcome: Progressing   Problem: Pain Managment: Goal: General experience of comfort will improve Outcome: Progressing   Problem: Safety: Goal: Ability to remain free from injury will improve Outcome: Progressing   Problem: Skin Integrity: Goal: Risk for impaired skin integrity will decrease Outcome: Progressing

## 2022-09-17 NOTE — Progress Notes (Signed)
PROGRESS NOTE    Micheal Levy  ZOX:[id number] DOB: 1980/07/11 DOA: 09/13/2022 PCP: Barbette Merino, NP  Brief Narrative: 42 year old male with history of congestive heart failure CKD type 2 diabetes and hypertension admitted with orthopnea and lower extremity edema and shortness of breath in spite of taking his outpatient diuretics.  His blood pressure on admission was 200/1 40s.  Chest x-ray was consistent with pulmonary edema creatinine 4.45 on admission BNP 1100.  Assessment & Plan:   Principal Problem:   Acute diastolic CHF (congestive heart failure) (HCC) Active Problems:   Acute on chronic heart failure with preserved ejection fraction (HFpEF) (HCC)   CKD (chronic kidney disease) stage 4, GFR 15-29 ml/min (HCC)   Hypertension associated with diabetes (HCC)   Insulin-requiring or dependent type II diabetes mellitus (HCC)   Hypertensive emergency   Elevated troponin   History of stroke   Malignant hypertension   #1 hypertensive emergency patient admitted with a blood pressure of 200/1 40s.  Currently on Catapres carvedilol Imdur hydralazine amlodipine with improvement.  Renal Doppler does not reveal his renal artery stenosis.  Continue Lasix.  Echo shows grade 2 diastolic dysfunction with severe concentric LVH. Blood pressure 140/85  #2 acute on chronic diastolic heart failure with grade 2 diastolic dysfunction by echo on Lasix beta-blockers nitrates hydralazine. On Lasix 120 mg every 8 Negative by 1700  #3 AKI on CKD stage IV his baseline creatinine is 3.7-3.9 Creatinine 4.3 from 4.45 on admission without much improvement but stable. Nephrology has discussed with the patient that he might need dialysis in the near future.  #4 type 2 diabetes on Semglee and SSI CBG (last 3)  Recent Labs    09/16/22 2058 09/17/22 0726 09/17/22 1123  GLUCAP 118* 142* 122*   Nutrition Problem: Increased nutrient needs Etiology: acute illness   Signs/Symptoms: estimated  needs    Interventions: Education, Refer to RD note for recommendations  Estimated body mass index is 39.46 kg/m as calculated from the following:   Height as of this encounter: 5\' 8"  (1.727 m).   Weight as of this encounter: 117.7 kg.  DVT prophylaxis: scd Code Status: Full code  family Communication: None at bedside  disposition Plan:  Status is: Inpatient Remains inpatient appropriate because: Hypertensive emergency CHF and stage IV CKD with fluid overload   Consultants:  Nephrology  Procedures: None Antimicrobials: None  Subjective: Resting in bed complains of shortness of breath with activity still has a lot of swelling in both lower extremities  Objective: Vitals:   09/17/22 0117 09/17/22 0500 09/17/22 0540 09/17/22 1312  BP: (!) 161/108  (!) 177/114 (!) 140/85  Pulse: (!) 57  64 62  Resp:   17 (!) 24  Temp:   (!) 97.5 F (36.4 C) 97.6 F (36.4 C)  TempSrc:   Oral Oral  SpO2:   97% 96%  Weight:  117.7 kg    Height:        Intake/Output Summary (Last 24 hours) at 09/17/2022 1424 Last data filed at 09/17/2022 1400 Gross per 24 hour  Intake 1572.3 ml  Output 3300 ml  Net -1727.7 ml   Filed Weights   09/16/22 0500 09/16/22 1826 09/17/22 0500  Weight: 120.4 kg 118.1 kg 117.7 kg    Examination:  General exam: Appears calm and comfortable  Respiratory system: Diminished breath sounds to auscultation. Respiratory effort normal. Cardiovascular system: S1 & S2 heard, RRR. No JVD, murmurs, rubs, gallops or clicks. No pedal edema. Gastrointestinal system: Abdomen is nondistended,  soft and nontender. No organomegaly or masses felt. Normal bowel sounds heard. Central nervous system: Alert and oriented. No focal neurological deficits. Extremities: 2+ edema Skin: No rashes, lesions or ulcers Psychiatry: Judgement and insight appear normal. Mood & affect appropriate.     Data Reviewed: I have personally reviewed following labs and imaging studies  CBC: Recent  Labs  Lab 09/13/22 2054 09/14/22 0158  WBC 6.3 6.9  HGB 11.2* 10.4*  HCT 36.1* 33.0*  MCV 86.0 84.8  PLT 259 275   Basic Metabolic Panel: Recent Labs  Lab 09/13/22 2054 09/14/22 0158 09/14/22 1517 09/16/22 0307 09/16/22 0311  NA 138 136 138  --  141  K 4.0 3.6 3.4*  --  3.3*  CL 107 106 108  --  109  CO2 20* 20* 22  --  23  GLUCOSE 141* 151* 157*  --  177*  BUN 34* 33* 34*  --  37*  CREATININE 4.45* 4.35* 4.35*  --  4.32*  CALCIUM 8.3* 8.0* 8.3*  --  8.5*  MG 2.3 2.0  --  2.3  --   PHOS 4.0 3.9  --   --   --    GFR: Estimated Creatinine Clearance: 27.2 mL/min (A) (by C-G formula based on SCr of 4.32 mg/dL (H)). Liver Function Tests: Recent Labs  Lab 09/14/22 0158  AST 21  22  ALT 10  11  ALKPHOS 60  56  BILITOT 0.9  0.9  PROT 6.2*  6.1*  ALBUMIN 3.0*  2.8*   No results for input(s): "LIPASE", "AMYLASE" in the last 168 hours. No results for input(s): "AMMONIA" in the last 168 hours. Coagulation Profile: No results for input(s): "INR", "PROTIME" in the last 168 hours. Cardiac Enzymes: Recent Labs  Lab 09/13/22 2054  CKTOTAL 302   BNP (last 3 results) No results for input(s): "PROBNP" in the last 8760 hours. HbA1C: No results for input(s): "HGBA1C" in the last 72 hours. CBG: Recent Labs  Lab 09/16/22 1151 09/16/22 1704 09/16/22 2058 09/17/22 0726 09/17/22 1123  GLUCAP 137* 166* 118* 142* 122*   Lipid Profile: No results for input(s): "CHOL", "HDL", "LDLCALC", "TRIG", "CHOLHDL", "LDLDIRECT" in the last 72 hours. Thyroid Function Tests: No results for input(s): "TSH", "T4TOTAL", "FREET4", "T3FREE", "THYROIDAB" in the last 72 hours. Anemia Panel: No results for input(s): "VITAMINB12", "FOLATE", "FERRITIN", "TIBC", "IRON", "RETICCTPCT" in the last 72 hours. Sepsis Labs: No results for input(s): "PROCALCITON", "LATICACIDVEN" in the last 168 hours.  Recent Results (from the past 240 hour(s))  MRSA Next Gen by PCR, Nasal     Status: None    Collection Time: 09/14/22  8:43 AM   Specimen: Nasal Mucosa; Nasal Swab  Result Value Ref Range Status   MRSA by PCR Next Gen NOT DETECTED NOT DETECTED Final    Comment: (NOTE) The GeneXpert MRSA Assay (FDA approved for NASAL specimens only), is one component of a comprehensive MRSA colonization surveillance program. It is not intended to diagnose MRSA infection nor to guide or monitor treatment for MRSA infections. Test performance is not FDA approved in patients less than 33 years old. Performed at [facility], [address], [place], [place] [address]      Radiology Studies: No results found.  Scheduled Meds:  amLODipine  10 mg Oral Daily   aspirin EC  81 mg Oral Daily   carvedilol  25 mg Oral BID   cloNIDine  0.3 mg Oral TID   hydrALAZINE  100 mg Oral TID  insulin aspart  0-5 Units Subcutaneous QHS   insulin aspart  0-9 Units Subcutaneous TID WC   insulin glargine-yfgn  5 Units Subcutaneous QHS   isosorbide mononitrate  120 mg Oral Daily   sodium chloride flush  3 mL Intravenous Q12H   Continuous Infusions:  sodium chloride     furosemide 62 mL/hr at 09/17/22 1400     LOS: 3 days    Time spent: 40 min  Alwyn Ren, MD 09/17/2022, 2:24 PM

## 2022-09-17 NOTE — Progress Notes (Signed)
[facility] Progress Note  Subjective: pt seen in room. 2.2 L UOP yest. Down to 117kg. SOB improving. Creat 4.3  Vitals:   09/17/22 0117 09/17/22 0500 09/17/22 0540 09/17/22 1312  BP: (!) 161/108  (!) 177/114 (!) 140/85  Pulse: (!) 57  64 62  Resp:   17 (!) 24  Temp:   (!) 97.5 F (36.4 C) 97.6 F (36.4 C)  TempSrc:   Oral Oral  SpO2:   97% 96%  Weight:  117.7 kg    Height:        Exam: Gen alert, no distress No jvd or bruits Chest poor air movement, no rales RRR no MRG Abd soft ntnd no mass or ascites +bs Ext 1-2+ bilat LE edema Neuro is alert, Ox 3 , nf        Home meds include - torsemide 80mg  qam w/ 2nd dose in pm prn, coreg 25 bid, asa, clonidine 0.3 tid, hydralazine 100 tid, imdur 120 qd, MVI, klorcon 40 qd, insulin glargine, losartan 50 qd, prns/ vits/ supps      Date                          Creat               eGFR    2017                         1.23    2018                         1.30- 2.86    2019                         1.22- 1.30    2021                         1.59- 2.75    2022                         1.65- 2.92    04/2021                  1.80- 2.73    2/01- 05/11/21            3.03- 3.35       23- 25 ml/min    2/11- 06/08/21            1.54- 4.15       17- 33 ml/min    06/24/21                     2.45                     33 ml/min       12/2021                 2.41- 2.76        28- 33 ml/min    2/15- 05/30/22             3.40- 4.61       15- 22 ml/min    4/14- 07/25/22              4.58 >> 3.77   16- 19 ml/min    09/13/22  4.45                16 ml/min    6/09                            4.35    09/15/22                       4.35                16 ml/min             UA 6/8 - 0-5 rbc/ wbc, prot 100     CXR - bilat pulm edema    Assessment/ Plan: SOB/ volume overload/ LE edema/ pulm edema - needs aggressive diuresis and BP control. His renal failure has worsened most likely to CKD 4, almost CKD 5 at this time. Have  counseled him on that the fact that he is likely approaching dialysis. Corrected him that he should not be pushing fluids at home, but rather restricting fluid intake as his renal function continues to drop. Would recommend 1200 cc /d max.  Will also need to adjust/ increase his torsemide dosing upon discharge. For now is diuresing well, will ^120 mg lasix IV tie. Goal wt is around 106- 110 kg.  CKD IV - b/l creat is 3.8- 4.5 from April 2024, eGFR 16-19 ml/min. Creat here is 4.3 in setting of recurrent volume overload. Renal function stable today. No uremics signs at this time.  HTN'sive urgency - BP 240/160 in ED, down to 180s/ 107 yest and today down to 140/85. Cont medications, improving.  IDDM     [name] MD [facility] 09/17/2022, 1:31 PM  Recent Labs  Lab 09/13/22 2054 09/14/22 0158 09/14/22 1517 09/16/22 0311  HGB 11.2* 10.4*  --   --   ALBUMIN  --  3.0*  2.8*  --   --   CALCIUM 8.3* 8.0* 8.3* 8.5*  PHOS 4.0 3.9  --   --   CREATININE 4.45* 4.35* 4.35* 4.32*  K 4.0 3.6 3.4* 3.3*    Recent Labs  Lab 09/14/22 0400  IRON 58  TIBC 311  FERRITIN 57    Inpatient medications:  amLODipine  10 mg Oral Daily   aspirin EC  81 mg Oral Daily   carvedilol  25 mg Oral BID   cloNIDine  0.3 mg Oral TID   hydrALAZINE  100 mg Oral TID   insulin aspart  0-5 Units Subcutaneous QHS   insulin aspart  0-9 Units Subcutaneous TID WC   insulin glargine-yfgn  5 Units Subcutaneous QHS   isosorbide mononitrate  120 mg Oral Daily   sodium chloride flush  3 mL Intravenous Q12H    sodium chloride     furosemide 120 mg (09/17/22 1302)   sodium chloride, acetaminophen **OR** acetaminophen, albuterol, hydrALAZINE, ondansetron **OR** ondansetron (ZOFRAN) IV, sodium chloride flush

## 2022-09-18 DIAGNOSIS — I5031 Acute diastolic (congestive) heart failure: Secondary | ICD-10-CM | POA: Diagnosis not present

## 2022-09-18 LAB — GLUCOSE, CAPILLARY
Glucose-Capillary: 114 mg/dL — ABNORMAL HIGH (ref 70–99)
Glucose-Capillary: 143 mg/dL — ABNORMAL HIGH (ref 70–99)
Glucose-Capillary: 227 mg/dL — ABNORMAL HIGH (ref 70–99)
Glucose-Capillary: 97 mg/dL (ref 70–99)

## 2022-09-18 LAB — BASIC METABOLIC PANEL
Anion gap: 13 (ref 5–15)
BUN: 36 mg/dL — ABNORMAL HIGH (ref 6–20)
CO2: 24 mmol/L (ref 22–32)
Calcium: 8.5 mg/dL — ABNORMAL LOW (ref 8.9–10.3)
Chloride: 103 mmol/L (ref 98–111)
Creatinine, Ser: 4.41 mg/dL — ABNORMAL HIGH (ref 0.61–1.24)
GFR, Estimated: 16 mL/min — ABNORMAL LOW (ref >60)
Glucose, Bld: 114 mg/dL — ABNORMAL HIGH (ref 70–99)
Potassium: 3.2 mmol/L — ABNORMAL LOW (ref 3.5–5.1)
Sodium: 140 mmol/L (ref 135–145)

## 2022-09-18 MED ORDER — FUROSEMIDE 10 MG/ML IJ SOLN
100.0000 mg | Freq: Three times a day (TID) | INTRAVENOUS | Status: DC
  Administered 2022-09-18 – 2022-09-20 (×5): 100 mg via INTRAVENOUS
  Filled 2022-09-18 (×6): qty 10

## 2022-09-18 MED ORDER — HYDRALAZINE HCL 50 MG PO TABS
100.0000 mg | ORAL_TABLET | Freq: Four times a day (QID) | ORAL | Status: DC
  Administered 2022-09-18 – 2022-09-19 (×4): 100 mg via ORAL
  Filled 2022-09-18 (×4): qty 2

## 2022-09-18 MED ORDER — ORAL CARE MOUTH RINSE: 15.0000 mL | OROMUCOSAL | Status: DC | PRN

## 2022-09-18 NOTE — Progress Notes (Signed)
PROGRESS NOTE    Micheal Levy  ZOX:[id number] DOB: 1980/11/05 DOA: 09/13/2022 PCP: Barbette Merino, NP  Brief Narrative: 42 year old male with history of congestive heart failure CKD type 2 diabetes and hypertension admitted with orthopnea and lower extremity edema and shortness of breath in spite of taking his outpatient diuretics.  His blood pressure on admission was 200/1 40s.  Chest x-ray was consistent with pulmonary edema creatinine 4.45 on admission BNP 1100.  Assessment & Plan:   Principal Problem:   Acute diastolic CHF (congestive heart failure) (HCC) Active Problems:   Acute on chronic heart failure with preserved ejection fraction (HFpEF) (HCC)   CKD (chronic kidney disease) stage 4, GFR 15-29 ml/min (HCC)   Hypertension associated with diabetes (HCC)   Insulin-requiring or dependent type II diabetes mellitus (HCC)   Hypertensive emergency   Elevated troponin   History of stroke   Malignant hypertension   #1 hypertensive emergency patient admitted with a blood pressure of 200/1 40s.  Currently on Catapres carvedilol Imdur hydralazine amlodipine with improvement.  Renal Doppler does not reveal his renal artery stenosis.  Continue Lasix.  Echo shows grade 2 diastolic dysfunction with severe concentric LVH. Blood pressure still remains elevated on Lasix, Norvasc, Coreg, hydralazine, and Catapres.  Increase hydralazine to 4 times a day  #2 acute on chronic diastolic heart failure with grade 2 diastolic dysfunction by echo on Lasix beta-blockers nitrates hydralazine. On Lasix 120 mg every 8 Negative by 4500  #3 AKI on CKD stage IV his baseline creatinine is 3.7-3.9 Creatinine 4.3 from 4.45 on admission without much improvement but stable. Nephrology has discussed with the patient that he might need dialysis in the near future.  #4 type 2 diabetes on Semglee and SSI CBG (last 3)  Recent Labs    09/17/22 2102 09/18/22 0729 09/18/22 1143  GLUCAP 122* 114* 143*     Nutrition Problem: Increased nutrient needs Etiology: acute illness   Signs/Symptoms: estimated needs    Interventions: Education, Refer to RD note for recommendations  Estimated body mass index is 39.45 kg/m as calculated from the following:   Height as of this encounter: 5\' 8"  (1.727 m).   Weight as of this encounter: 117.7 kg.  DVT prophylaxis: scd Code Status: Full code  family Communication: None at bedside  disposition Plan:  Status is: Inpatient Remains inpatient appropriate because: Hypertensive emergency CHF and stage IV CKD with fluid overload   Consultants:  Nephrology  Procedures: None Antimicrobials: None  Subjective:  No new complaints he feels his breathing is better swelling in lower extremities still present but better Objective: Vitals:   09/18/22 0617 09/18/22 0657 09/18/22 0917 09/18/22 1144  BP: (!) 198/123 (!) 173/111 (!) 160/103 (!) 166/116  Pulse: 66 68 76 (!) 57  Resp:    20  Temp: 98.4 F (36.9 C)   98 F (36.7 C)  TempSrc:    Oral  SpO2: 98%  96% 98%  Weight:      Height:        Intake/Output Summary (Last 24 hours) at 09/18/2022 1211 Last data filed at 09/18/2022 1145 Gross per 24 hour  Intake 778.3 ml  Output 5355 ml  Net -4576.7 ml    Filed Weights   09/16/22 1826 09/17/22 0500 09/18/22 0500  Weight: 118.1 kg 117.7 kg 117.7 kg    Examination:  General exam: Appears calm and comfortable  Respiratory system: Diminished breath sounds to auscultation. Respiratory effort normal. Cardiovascular system: S1 & S2 heard, RRR. No  JVD, murmurs, rubs, gallops or clicks. No pedal edema. Gastrointestinal system: Abdomen is nondistended, soft and nontender. No organomegaly or masses felt. Normal bowel sounds heard. Central nervous system: Alert and oriented. No focal neurological deficits. Extremities: 2+ edema Skin: No rashes, lesions or ulcers Psychiatry: Judgement and insight appear normal. Mood & affect appropriate.     Data  Reviewed: I have personally reviewed following labs and imaging studies  CBC: Recent Labs  Lab 09/13/22 2054 09/14/22 0158  WBC 6.3 6.9  HGB 11.2* 10.4*  HCT 36.1* 33.0*  MCV 86.0 84.8  PLT 259 275    Basic Metabolic Panel: Recent Labs  Lab 09/13/22 2054 09/14/22 0158 09/14/22 1517 09/16/22 0307 09/16/22 0311 09/18/22 0515  NA 138 136 138  --  141 140  K 4.0 3.6 3.4*  --  3.3* 3.2*  CL 107 106 108  --  109 103  CO2 20* 20* 22  --  23 24  GLUCOSE 141* 151* 157*  --  177* 114*  BUN 34* 33* 34*  --  37* 36*  CREATININE 4.45* 4.35* 4.35*  --  4.32* 4.41*  CALCIUM 8.3* 8.0* 8.3*  --  8.5* 8.5*  MG 2.3 2.0  --  2.3  --   --   PHOS 4.0 3.9  --   --   --   --     GFR: Estimated Creatinine Clearance: 26.6 mL/min (A) (by C-G formula based on SCr of 4.41 mg/dL (H)). Liver Function Tests: Recent Labs  Lab 09/14/22 0158  AST 21  22  ALT 10  11  ALKPHOS 60  56  BILITOT 0.9  0.9  PROT 6.2*  6.1*  ALBUMIN 3.0*  2.8*    No results for input(s): "LIPASE", "AMYLASE" in the last 168 hours. No results for input(s): "AMMONIA" in the last 168 hours. Coagulation Profile: No results for input(s): "INR", "PROTIME" in the last 168 hours. Cardiac Enzymes: Recent Labs  Lab 09/13/22 2054  CKTOTAL 302    BNP (last 3 results) No results for input(s): "PROBNP" in the last 8760 hours. HbA1C: No results for input(s): "HGBA1C" in the last 72 hours. CBG: Recent Labs  Lab 09/17/22 1123 09/17/22 1646 09/17/22 2102 09/18/22 0729 09/18/22 1143  GLUCAP 122* 153* 122* 114* 143*    Lipid Profile: No results for input(s): "CHOL", "HDL", "LDLCALC", "TRIG", "CHOLHDL", "LDLDIRECT" in the last 72 hours. Thyroid Function Tests: No results for input(s): "TSH", "T4TOTAL", "FREET4", "T3FREE", "THYROIDAB" in the last 72 hours. Anemia Panel: No results for input(s): "VITAMINB12", "FOLATE", "FERRITIN", "TIBC", "IRON", "RETICCTPCT" in the last 72 hours. Sepsis Labs: No results for  input(s): "PROCALCITON", "LATICACIDVEN" in the last 168 hours.  Recent Results (from the past 240 hour(s))  MRSA Next Gen by PCR, Nasal     Status: None   Collection Time: 09/14/22  8:43 AM   Specimen: Nasal Mucosa; Nasal Swab  Result Value Ref Range Status   MRSA by PCR Next Gen NOT DETECTED NOT DETECTED Final    Comment: (NOTE) The GeneXpert MRSA Assay (FDA approved for NASAL specimens only), is one component of a comprehensive MRSA colonization surveillance program. It is not intended to diagnose MRSA infection nor to guide or monitor treatment for MRSA infections. Test performance is not FDA approved in patients less than 61 years old. Performed at [facility], [address], [place], [place] [address]      Radiology Studies: No results found.  Scheduled Meds:  amLODipine  10 mg Oral Daily  aspirin EC  81 mg Oral Daily   carvedilol  25 mg Oral BID   cloNIDine  0.3 mg Oral TID   heparin injection (subcutaneous)  5,000 Units Subcutaneous Q8H   hydrALAZINE  100 mg Oral TID   insulin aspart  0-5 Units Subcutaneous QHS   insulin aspart  0-9 Units Subcutaneous TID WC   insulin glargine-yfgn  5 Units Subcutaneous QHS   isosorbide mononitrate  120 mg Oral Daily   sodium chloride flush  3 mL Intravenous Q12H   Continuous Infusions:  sodium chloride     furosemide 120 mg (09/18/22 0613)     LOS: 4 days    Time spent: 40 min  Alwyn Ren, MD 09/18/2022, 12:11 PM

## 2022-09-18 NOTE — Progress Notes (Signed)
[facility] Progress Note  Subjective: pt seen in room. 4.0 L UOP yesterday.   Vitals:   09/18/22 0617 09/18/22 0657 09/18/22 0917 09/18/22 1144  BP: (!) 198/123 (!) 173/111 (!) 160/103 (!) 166/116  Pulse: 66 68 76 (!) 57  Resp:    20  Temp: 98.4 F (36.9 C)   98 F (36.7 C)  TempSrc:    Oral  SpO2: 98%  96% 98%  Weight:      Height:        Exam: Gen alert, no distress No jvd or bruits Chest poor air movement, no rales RRR no MRG Abd soft ntnd no mass or ascites +bs Ext 1-2+ bilat LE edema Neuro is alert, Ox 3 , nf        Home meds include - torsemide 80mg  qam w/ 2nd dose in pm prn, coreg 25 bid, asa, clonidine 0.3 tid, hydralazine 100 tid, imdur 120 qd, MVI, klorcon 40 qd, insulin glargine, losartan 50 qd, prns/ vits/ supps      Date                          Creat               eGFR    2017                         1.23    2018                         1.30- 2.86    2019                         1.22- 1.30    2021                         1.59- 2.75    2022                         1.65- 2.92    04/2021                  1.80- 2.73    2/01- 05/11/21            3.03- 3.35       23- 25 ml/min    2/11- 06/08/21            1.54- 4.15       17- 33 ml/min    06/24/21                     2.45                     33 ml/min       12/2021                   2.41- 2.76        28- 33 ml/min    2/15- 05/30/22             3.40- 4.61       15- 22 ml/min    4/14- 07/25/22              4.58 >> 3.77   16- 19 ml/min    09/13/22  4.45                16 ml/min    6/09                            4.35    09/15/22                       4.35                16 ml/min             UA 6/8 - 0-5 rbc/ wbc, prot 100     CXR - bilat pulm edema    Assessment/ Plan: SOB/ volume overload/ LE edema/ pulm edema - needs aggressive diuresis and BP control. His renal failure has worsened over time and he is now CKD 4, almost CKD 5. Have counseled him on that the fact that he may be  approaching dialysis. Corrected him that he should not be pushing fluids at home, but rather restricting fluid intake as his renal function continues to drop. Would recommend 1200 cc /d max.  Will also need to adjust/ increase his torsemide dosing upon discharge. This is the 6th day of high dose IV lasix, currently up to 120mg  tid. Will lower to 100mg  tid. Goal wt is around 106- 110 kg. Cont diuresis.  CKD IV - b/l creat is 3.8- 4.5 from April 2024, eGFR 16-19 ml/min. Creat here is 4.3- 4.5  in setting of recurrent volume overload. No uremics signs at this time.  HTN'sive urgency - BP 240/160 in ED, down to 170/ 90s today. Cont medications, improving.  IDDM     Micheal Moselle MD [facility] 09/18/2022, 2:16 PM  Recent Labs  Lab 09/13/22 2054 09/14/22 0158 09/14/22 1517 09/16/22 0311 09/18/22 0515  HGB 11.2* 10.4*  --   --   --   ALBUMIN  --  3.0*  2.8*  --   --   --   CALCIUM 8.3* 8.0*   < > 8.5* 8.5*  PHOS 4.0 3.9  --   --   --   CREATININE 4.45* 4.35*   < > 4.32* 4.41*  K 4.0 3.6   < > 3.3* 3.2*   < > = values in this interval not displayed.    Recent Labs  Lab 09/14/22 0400  IRON 58  TIBC 311  FERRITIN 57    Inpatient medications:  amLODipine  10 mg Oral Daily   aspirin EC  81 mg Oral Daily   carvedilol  25 mg Oral BID   cloNIDine  0.3 mg Oral TID   heparin injection (subcutaneous)  5,000 Units Subcutaneous Q8H   hydrALAZINE  100 mg Oral QID   insulin aspart  0-5 Units Subcutaneous QHS   insulin aspart  0-9 Units Subcutaneous TID WC   insulin glargine-yfgn  5 Units Subcutaneous QHS   isosorbide mononitrate  120 mg Oral Daily   sodium chloride flush  3 mL Intravenous Q12H    sodium chloride     furosemide     sodium chloride, acetaminophen **OR** acetaminophen, albuterol, hydrALAZINE, ondansetron **OR** ondansetron (ZOFRAN) IV, mouth rinse, sodium chloride flush

## 2022-09-19 DIAGNOSIS — I5031 Acute diastolic (congestive) heart failure: Secondary | ICD-10-CM | POA: Diagnosis not present

## 2022-09-19 LAB — BASIC METABOLIC PANEL
Anion gap: 11 (ref 5–15)
BUN: 42 mg/dL — ABNORMAL HIGH (ref 6–20)
CO2: 26 mmol/L (ref 22–32)
Calcium: 8.5 mg/dL — ABNORMAL LOW (ref 8.9–10.3)
Chloride: 102 mmol/L (ref 98–111)
Creatinine, Ser: 4.24 mg/dL — ABNORMAL HIGH (ref 0.61–1.24)
GFR, Estimated: 17 mL/min — ABNORMAL LOW (ref >60)
Glucose, Bld: 121 mg/dL — ABNORMAL HIGH (ref 70–99)
Potassium: 3.6 mmol/L (ref 3.5–5.1)
Sodium: 139 mmol/L (ref 135–145)

## 2022-09-19 LAB — GLUCOSE, CAPILLARY
Glucose-Capillary: 128 mg/dL — ABNORMAL HIGH (ref 70–99)
Glucose-Capillary: 163 mg/dL — ABNORMAL HIGH (ref 70–99)
Glucose-Capillary: 147 mg/dL — ABNORMAL HIGH (ref 70–99)
Glucose-Capillary: 132 mg/dL — ABNORMAL HIGH (ref 70–99)

## 2022-09-19 MED ORDER — HYDRALAZINE HCL 50 MG PO TABS
100.0000 mg | ORAL_TABLET | Freq: Three times a day (TID) | ORAL | Status: DC
  Administered 2022-09-19 – 2022-09-20 (×2): 100 mg via ORAL
  Filled 2022-09-19 (×2): qty 2

## 2022-09-19 NOTE — Plan of Care (Signed)
  Problem: Education: Goal: Ability to demonstrate management of disease process will improve Outcome: Progressing Goal: Ability to verbalize understanding of medication therapies will improve Outcome: Progressing   Problem: Activity: Goal: Capacity to carry out activities will improve Outcome: Progressing   

## 2022-09-19 NOTE — Progress Notes (Addendum)
[facility] Progress Note  Subjective: pt seen in room. 4.2 L UOP yesterday, creat stable at 4.41.  SOB much better. Wt's down to 113kg.  Hoping to be dc'd tomorrow.   Vitals:   09/18/22 1941 09/19/22 0438 09/19/22 0706 09/19/22 1002  BP: (!) 140/81 (!) 176/109  (!) 165/108  Pulse: 62 61  66  Resp: 20 20    Temp: 97.8 F (36.6 C) 98.2 F (36.8 C)    TempSrc: Oral Oral    SpO2: 97% 97%    Weight:   113.6 kg   Height:        Exam: Gen alert, no distress No jvd or bruits Chest poor air movement, no rales RRR no MRG Abd soft ntnd no mass or ascites +bs Ext 1+ bilat LE edema, improved Neuro is alert, Ox 3 , nf        Home meds include - torsemide 80mg  qam w/ 2nd dose in pm prn, coreg 25 bid, asa, clonidine 0.3 tid, hydralazine 100 tid, imdur 120 qd, MVI, klorcon 40 qd, insulin glargine, losartan 50 qd, prns/ vits/ supps      Date                          Creat               eGFR    2017                         1.23    2018                         1.30- 2.86    2019                         1.22- 1.30    2021                         1.59- 2.75    2022                         1.65- 2.92    04/2021                  1.80- 2.73    2/01- 05/11/21            3.03- 3.35       23- 25 ml/min    2/11- 06/08/21            1.54- 4.15       17- 33 ml/min    06/24/21                     2.45                     33 ml/min       12/2021                   2.41- 2.76        28- 33 ml/min    2/15- 05/30/22             3.40- 4.61       15- 22 ml/min    4/14- 07/25/22              4.58 >> 3.77   16-  19 ml/min    09/13/22                       4.45                16 ml/min    6/09                            4.35    09/15/22                       4.35                16 ml/min             UA 6/8 - 0-5 rbc/ wbc, prot 100     CXR - bilat pulm edema    Assessment/ Plan: SOB/ volume overload/ LE edema/ pulm edema - needs aggressive diuresis and BP control. His renal failure has worsened over  time and he is now CKD 4, almost CKD 5.  - have counseled him on that the fact that he may need dialysis within a year - he should not be pushing fluids at home, restrict fluid intake to about 1200 cc /d - will continue IV lasix diuresis overnight and will anticipate dc tomorrow/ saturday - upon dc we will ^torsemide to 100mg  bid - upon dc we will add zaroxolyn to 2.5 mg once weekly on Sat - will see him in extender clinic post dc in 2-3 wks - have d/w Dr Thedore Mins, his renal MD  CKD IV - b/l creat is 3.8- 4.5 from April 2024, eGFR 16-19 ml/min. Creat here is 4.3- 4.5  in setting of recurrent volume overload. No uremics signs at this time.  HTN'sive urgency - BP 240/160 in ED, down to 170/ 90s now. Cont medications, improving.  IDDM     [name] MD [facility] 09/19/2022, 1:49 PM  Recent Labs  Lab 09/13/22 2054 09/14/22 0158 09/14/22 1517 09/18/22 0515 09/19/22 0454  HGB 11.2* 10.4*  --   --   --   ALBUMIN  --  3.0*  2.8*  --   --   --   CALCIUM 8.3* 8.0*   < > 8.5* 8.5*  PHOS 4.0 3.9  --   --   --   CREATININE 4.45* 4.35*   < > 4.41* 4.24*  K 4.0 3.6   < > 3.2* 3.6   < > = values in this interval not displayed.    Recent Labs  Lab 09/14/22 0400  IRON 58  TIBC 311  FERRITIN 57    Inpatient medications:  amLODipine  10 mg Oral Daily   aspirin EC  81 mg Oral Daily   carvedilol  25 mg Oral BID   cloNIDine  0.3 mg Oral TID   heparin injection (subcutaneous)  5,000 Units Subcutaneous Q8H   hydrALAZINE  100 mg Oral QID   insulin aspart  0-5 Units Subcutaneous QHS   insulin aspart  0-9 Units Subcutaneous TID WC   insulin glargine-yfgn  5 Units Subcutaneous QHS   isosorbide mononitrate  120 mg Oral Daily   sodium chloride flush  3 mL Intravenous Q12H    sodium chloride     furosemide 100 mg (09/19/22 0609)   sodium chloride, acetaminophen **OR** acetaminophen, albuterol, hydrALAZINE, ondansetron **OR** ondansetron (ZOFRAN) IV, mouth rinse, sodium chloride flush

## 2022-09-19 NOTE — Progress Notes (Signed)
PROGRESS NOTE    Micheal Levy  ZOX:[id number] DOB: 09-07-1980 DOA: 09/13/2022 PCP: Barbette Merino, NP  Brief Narrative: 42 year old male with history of congestive heart failure CKD type 2 diabetes and hypertension admitted with orthopnea and lower extremity edema and shortness of breath in spite of taking his outpatient diuretics.  His blood pressure on admission was 200/1 40s.  Chest x-ray was consistent with pulmonary edema creatinine 4.45 on admission BNP 1100.  Assessment & Plan:   Principal Problem:   Acute diastolic CHF (congestive heart failure) (HCC) Active Problems:   Acute on chronic heart failure with preserved ejection fraction (HFpEF) (HCC)   CKD (chronic kidney disease) stage 4, GFR 15-29 ml/min (HCC)   Hypertension associated with diabetes (HCC)   Insulin-requiring or dependent type II diabetes mellitus (HCC)   Hypertensive emergency   Elevated troponin   History of stroke   Malignant hypertension   #1 hypertensive emergency -blood pressure improved since admission on multiple antihypertensives.  Hydralazine dose was increased on 09/18/2022.  Currently on Catapres carvedilol Imdur hydralazine amlodipine with improvement.  Renal Doppler does not reveal his renal artery stenosis.  Continue Lasix.  Echo shows grade 2 diastolic dysfunction with severe concentric LVH. Blood pressure still remains elevated on Lasix, Norvasc, Coreg, hydralazine, and Catapres.  Increase hydralazine to 4 times a day  #2 acute on chronic diastolic heart failure with grade 2 diastolic dysfunction by echo on Lasix beta-blockers nitrates hydralazine. On Lasix 120 mg every 8 Negative by 2360   #3 AKI on CKD stage IV his baseline creatinine is 3.7-3.9 Creatinine 4.3 from 4.45 on admission without much improvement but stable. Nephrology has discussed with the patient that he might need dialysis in the near future.  #4 type 2 diabetes on Semglee and SSI CBG (last 3)  Recent Labs    09/18/22 2051  09/19/22 0721 09/19/22 1154  GLUCAP 97 128* 163*    Nutrition Problem: Increased nutrient needs Etiology: acute illness   Signs/Symptoms: estimated needs    Interventions: Education, Refer to RD note for recommendations  Estimated body mass index is 38.07 kg/m as calculated from the following:   Height as of this encounter: 5\' 8"  (1.727 m).   Weight as of this encounter: 113.6 kg.  DVT prophylaxis: scd Code Status: Full code  family Communication: None at bedside  disposition Plan:  Status is: Inpatient Remains inpatient appropriate because: Hypertensive emergency CHF and stage IV CKD with fluid overload   Consultants:  Nephrology  Procedures: None Antimicrobials: None  Subjective: Wt 113 from 117 No new c/o Objective: Vitals:   09/18/22 1941 09/19/22 0438 09/19/22 0706 09/19/22 1002  BP: (!) 140/81 (!) 176/109  (!) 165/108  Pulse: 62 61  66  Resp: 20 20    Temp: 97.8 F (36.6 C) 98.2 F (36.8 C)    TempSrc: Oral Oral    SpO2: 97% 97%    Weight:   113.6 kg   Height:        Intake/Output Summary (Last 24 hours) at 09/19/2022 1350 Last data filed at 09/19/2022 0900 Gross per 24 hour  Intake 540 ml  Output 2900 ml  Net -2360 ml    Filed Weights   09/17/22 0500 09/18/22 0500 09/19/22 0706  Weight: 117.7 kg 117.7 kg 113.6 kg    Examination:  General exam: Appears calm and comfortable  Respiratory system: Diminished breath sounds to auscultation. Respiratory effort normal. Cardiovascular system: S1 & S2 heard, RRR. No JVD, murmurs, rubs, gallops or  clicks. No pedal edema. Gastrointestinal system: Abdomen is nondistended, soft and nontender. No organomegaly or masses felt. Normal bowel sounds heard. Central nervous system: Alert and oriented. No focal neurological deficits. Extremities: 2+ edema Skin: No rashes, lesions or ulcers Psychiatry: Judgement and insight appear normal. Mood & affect appropriate.     Data Reviewed: I have personally reviewed  following labs and imaging studies  CBC: Recent Labs  Lab 09/13/22 2054 09/14/22 0158  WBC 6.3 6.9  HGB 11.2* 10.4*  HCT 36.1* 33.0*  MCV 86.0 84.8  PLT 259 275    Basic Metabolic Panel: Recent Labs  Lab 09/13/22 2054 09/14/22 0158 09/14/22 1517 09/16/22 0307 09/16/22 0311 09/18/22 0515 09/19/22 0454  NA 138 136 138  --  141 140 139  K 4.0 3.6 3.4*  --  3.3* 3.2* 3.6  CL 107 106 108  --  109 103 102  CO2 20* 20* 22  --  23 24 26   GLUCOSE 141* 151* 157*  --  177* 114* 121*  BUN 34* 33* 34*  --  37* 36* 42*  CREATININE 4.45* 4.35* 4.35*  --  4.32* 4.41* 4.24*  CALCIUM 8.3* 8.0* 8.3*  --  8.5* 8.5* 8.5*  MG 2.3 2.0  --  2.3  --   --   --   PHOS 4.0 3.9  --   --   --   --   --     GFR: Estimated Creatinine Clearance: 27.2 mL/min (A) (by C-G formula based on SCr of 4.24 mg/dL (H)). Liver Function Tests: Recent Labs  Lab 09/14/22 0158  AST 21  22  ALT 10  11  ALKPHOS 60  56  BILITOT 0.9  0.9  PROT 6.2*  6.1*  ALBUMIN 3.0*  2.8*    No results for input(s): "LIPASE", "AMYLASE" in the last 168 hours. No results for input(s): "AMMONIA" in the last 168 hours. Coagulation Profile: No results for input(s): "INR", "PROTIME" in the last 168 hours. Cardiac Enzymes: Recent Labs  Lab 09/13/22 2054  CKTOTAL 302    BNP (last 3 results) No results for input(s): "PROBNP" in the last 8760 hours. HbA1C: No results for input(s): "HGBA1C" in the last 72 hours. CBG: Recent Labs  Lab 09/18/22 1143 09/18/22 1615 09/18/22 2051 09/19/22 0721 09/19/22 1154  GLUCAP 143* 227* 97 128* 163*    Lipid Profile: No results for input(s): "CHOL", "HDL", "LDLCALC", "TRIG", "CHOLHDL", "LDLDIRECT" in the last 72 hours. Thyroid Function Tests: No results for input(s): "TSH", "T4TOTAL", "FREET4", "T3FREE", "THYROIDAB" in the last 72 hours. Anemia Panel: No results for input(s): "VITAMINB12", "FOLATE", "FERRITIN", "TIBC", "IRON", "RETICCTPCT" in the last 72 hours. Sepsis  Labs: No results for input(s): "PROCALCITON", "LATICACIDVEN" in the last 168 hours.  Recent Results (from the past 240 hour(s))  MRSA Next Gen by PCR, Nasal     Status: None   Collection Time: 09/14/22  8:43 AM   Specimen: Nasal Mucosa; Nasal Swab  Result Value Ref Range Status   MRSA by PCR Next Gen NOT DETECTED NOT DETECTED Final    Comment: (NOTE) The GeneXpert MRSA Assay (FDA approved for NASAL specimens only), is one component of a comprehensive MRSA colonization surveillance program. It is not intended to diagnose MRSA infection nor to guide or monitor treatment for MRSA infections. Test performance is not FDA approved in patients less than 77 years old. Performed at [facility], [address] [place], [place] [address]      Radiology Studies: No results  found.  Scheduled Meds:  amLODipine  10 mg Oral Daily   aspirin EC  81 mg Oral Daily   carvedilol  25 mg Oral BID   cloNIDine  0.3 mg Oral TID   heparin injection (subcutaneous)  5,000 Units Subcutaneous Q8H   hydrALAZINE  100 mg Oral QID   insulin aspart  0-5 Units Subcutaneous QHS   insulin aspart  0-9 Units Subcutaneous TID WC   insulin glargine-yfgn  5 Units Subcutaneous QHS   isosorbide mononitrate  120 mg Oral Daily   sodium chloride flush  3 mL Intravenous Q12H   Continuous Infusions:  sodium chloride     furosemide 100 mg (09/19/22 0609)     LOS: 5 days    Time spent: 40 min  Alwyn Ren, MD 09/19/2022, 1:50 PM

## 2022-09-20 DIAGNOSIS — I5031 Acute diastolic (congestive) heart failure: Secondary | ICD-10-CM | POA: Diagnosis not present

## 2022-09-20 LAB — BASIC METABOLIC PANEL
Anion gap: 11 (ref 5–15)
BUN: 43 mg/dL — ABNORMAL HIGH (ref 6–20)
CO2: 26 mmol/L (ref 22–32)
Calcium: 8.4 mg/dL — ABNORMAL LOW (ref 8.9–10.3)
Chloride: 101 mmol/L (ref 98–111)
Creatinine, Ser: 4.31 mg/dL — ABNORMAL HIGH (ref 0.61–1.24)
GFR, Estimated: 16 mL/min — ABNORMAL LOW (ref >60)
Glucose, Bld: 140 mg/dL — ABNORMAL HIGH (ref 70–99)
Potassium: 3.2 mmol/L — ABNORMAL LOW (ref 3.5–5.1)
Sodium: 138 mmol/L (ref 135–145)

## 2022-09-20 LAB — GLUCOSE, CAPILLARY: Glucose-Capillary: 123 mg/dL — ABNORMAL HIGH (ref 70–99)

## 2022-09-20 MED ORDER — TORSEMIDE 100 MG PO TABS
100.0000 mg | ORAL_TABLET | Freq: Two times a day (BID) | ORAL | Status: DC
  Administered 2022-09-20: 100 mg via ORAL
  Filled 2022-09-20: qty 1

## 2022-09-20 MED ORDER — ONDANSETRON HCL 4 MG PO TABS: 4.0000 mg | ORAL_TABLET | Freq: Four times a day (QID) | ORAL | 0 refills | Status: AC | PRN

## 2022-09-20 MED ORDER — METOLAZONE 2.5 MG PO TABS: 2.5000 mg | ORAL_TABLET | ORAL | 4 refills | Status: AC

## 2022-09-20 MED ORDER — CARVEDILOL 25 MG PO TABS: 25.0000 mg | ORAL_TABLET | Freq: Two times a day (BID) | ORAL | 4 refills | Status: AC

## 2022-09-20 MED ORDER — TORSEMIDE 100 MG PO TABS: 100.0000 mg | ORAL_TABLET | Freq: Two times a day (BID) | ORAL | 4 refills | Status: AC

## 2022-09-20 MED ORDER — METOLAZONE 2.5 MG PO TABS
2.5000 mg | ORAL_TABLET | ORAL | Status: DC
  Administered 2022-09-20: 2.5 mg via ORAL
  Filled 2022-09-20: qty 1

## 2022-09-20 MED ORDER — ACETAMINOPHEN 325 MG PO TABS: 650.0000 mg | ORAL_TABLET | Freq: Four times a day (QID) | ORAL | Status: AC | PRN

## 2022-09-20 MED ORDER — ISOSORBIDE MONONITRATE ER 120 MG PO TB24: 120.0000 mg | ORAL_TABLET | Freq: Every day | ORAL | 4 refills | Status: AC

## 2022-09-20 MED ORDER — AMLODIPINE BESYLATE 10 MG PO TABS: 10.0000 mg | ORAL_TABLET | Freq: Every day | ORAL | 4 refills | Status: AC

## 2022-09-20 NOTE — TOC Transition Note (Signed)
Transition of Care [facility]) - CM/SW Discharge Note   Patient Details  Name: Micheal Levy MRN: [id number] Date of Birth: 1980-11-16  Transition of Care [facility]) CM/SW Contact:  Adrian Prows, RN Phone Number: 09/20/2022, 10:28 AM   Clinical Narrative:    D/C orders received; no TOC needs.   Final next level of care: Home/Self Care Barriers to Discharge: No Barriers Identified   Patient Goals and CMS Choice      Discharge Placement                         Discharge Plan and Services Additional resources added to the After Visit Summary for                                       Social Determinants of Health (SDOH) Interventions SDOH Screenings   Food Insecurity: No Food Insecurity (09/14/2022)  Housing: Low Risk  (09/14/2022)  Transportation Needs: No Transportation Needs (09/14/2022)  Utilities: Not At Risk (09/14/2022)  Depression (PHQ2-9): Low Risk  (11/11/2019)  Tobacco Use: Medium Risk (09/13/2022)     Readmission Risk Interventions    09/17/2022    8:49 AM 07/22/2022    9:13 AM 05/30/2022    1:15 PM  Readmission Risk Prevention Plan  Transportation Screening Complete Complete Complete  PCP or Specialist Appt within 3-5 Days Complete Complete Complete  HRI or Home Care Consult Complete Complete Complete  Social Work Consult for Recovery Care Planning/Counseling Complete Complete Complete  Palliative Care Screening Not Applicable Not Applicable Not Applicable  Medication Review Oceanographer) Complete Complete Complete

## 2022-09-20 NOTE — Progress Notes (Signed)
Micheal Levy Progress Note  Subjective: pt seen in room. 2.5 L UOP yesterday. Plan is for dc today.   Vitals:   09/19/22 1350 09/19/22 2106 09/20/22 0500 09/20/22 0527  BP: 108/63 (!) 158/102  (!) 153/96  Pulse: 64 (!) 55  65  Resp:  20  20  Temp: 97.9 F (36.6 C) 97.9 F (36.6 C)  98.3 F (36.8 C)  TempSrc: Oral Oral  Oral  SpO2: 92% 97%  96%  Weight:   115 kg   Height:        Exam: Gen alert, no distress No jvd or bruits Chest poor air movement, no rales RRR no MRG Abd soft ntnd no mass or ascites +bs Ext 1+ bilat LE edema, improved Neuro is alert, Ox 3 , nf        Home meds include - torsemide 80mg  qam w/ 2nd dose in pm prn, coreg 25 bid, asa, clonidine 0.3 tid, hydralazine 100 tid, imdur 120 qd, MVI, klorcon 40 qd, insulin glargine, losartan 50 qd, prns/ vits/ supps      Date                          Creat               eGFR    2017                         1.23    2018                         1.30- 2.86    2019                         1.22- 1.30    2021                         1.59- 2.75    2022                         1.65- 2.92    04/2021                  1.80- 2.73    2/01- 05/11/21            3.03- 3.35       23- 25 ml/min    2/11- 06/08/21            1.54- 4.15       17- 33 ml/min    06/24/21                     2.45                     33 ml/min       12/2021                   2.41- 2.76        28- 33 ml/min    2/15- 05/30/22             3.40- 4.61       15- 22 ml/min    4/14- 07/25/22              4.58 >> 3.77   16- 19 ml/min    09/13/22  4.45                16 ml/min    6/09                            4.35    09/15/22                       4.35                16 ml/min             UA 6/8 - 0-5 rbc/ wbc, prot 100     CXR - bilat pulm edema    Assessment/ Plan: SOB/ volume overload/ LE edema/ pulm edema - needs aggressive diuresis and BP control. His renal failure has worsened over time and he is now CKD 4, almost CKD 5.  Diuresed well and SOB resolved. - have counseled pt that he is approaching dialysis  - important --> restrict fluid intake to 1200 cc /d at home - have discussed w/ his nephrologist Micheal Levy - ok for dc home today - we are increasing home ^torsemide to 100mg  bid - we have added zaroxolyn 2.5 mg once weekly on Sat - will see him in extender clinic post dc in about 2 wks  CKD IV - b/l creat is 3.8- 4.5 from April 2024, eGFR 16-19 ml/min. Creat here is 4.3- 4.5  in setting of recurrent volume overload. No uremics signs.  HTN'sive urgency - BP 240/160 in ED, down to 170/ 90s now. Cont medications, improving.  IDDM     Micheal Moselle MD [facility] 09/20/2022, 9:05 AM  Recent Labs  Lab 09/13/22 2054 09/14/22 0158 09/14/22 1517 09/19/22 0454 09/20/22 0540  HGB 11.2* 10.4*  --   --   --   ALBUMIN  --  3.0*  2.8*  --   --   --   CALCIUM 8.3* 8.0*   < > 8.5* 8.4*  PHOS 4.0 3.9  --   --   --   CREATININE 4.45* 4.35*   < > 4.24* 4.31*  K 4.0 3.6   < > 3.6 3.2*   < > = values in this interval not displayed.    Recent Labs  Lab 09/14/22 0400  IRON 58  TIBC 311  FERRITIN 57    Inpatient medications:  amLODipine  10 mg Oral Daily   aspirin EC  81 mg Oral Daily   carvedilol  25 mg Oral BID   cloNIDine  0.3 mg Oral TID   heparin injection (subcutaneous)  5,000 Units Subcutaneous Q8H   hydrALAZINE  100 mg Oral Q8H   insulin aspart  0-5 Units Subcutaneous QHS   insulin aspart  0-9 Units Subcutaneous TID WC   insulin glargine-yfgn  5 Units Subcutaneous QHS   isosorbide mononitrate  120 mg Oral Daily   metolazone  2.5 mg Oral Weekly   sodium chloride flush  3 mL Intravenous Q12H   torsemide  100 mg Oral BID    sodium chloride     sodium chloride, acetaminophen **OR** acetaminophen, albuterol, hydrALAZINE, ondansetron **OR** ondansetron (ZOFRAN) IV, mouth rinse, sodium chloride flush

## 2022-09-20 NOTE — Progress Notes (Signed)
Doctor stated to D/c pt with bp at 170/110

## 2022-09-22 ENCOUNTER — Telehealth: Payer: Self-pay

## 2022-09-22 NOTE — Transitions of Care (Post Inpatient/ED Visit) (Signed)
   09/22/2022  Name: [name] MRN: [id number] DOB: [birth year]  Today's TOC FU Call Status: Today's TOC FU Call Status:: Unsuccessul Call (1st Attempt)  Attempted to reach the patient regarding the most recent Inpatient/ED visit.  Follow Up Plan: Additional outreach attempts will be made to reach the patient to complete the Transitions of Care (Post Inpatient/ED visit) call.   Signature  TB,CMA

## 2022-09-22 NOTE — Discharge Summary (Signed)
Physician Discharge Summary  [name]:[id number] DOB: 08-08-1980 DOA: 09/13/2022  PCP: Barbette Merino, NP  Admit date: 09/13/2022 Discharge date:09/20/2022 Admitted From: Home Disposition: Home  Recommendations for Outpatient Follow-up:  Follow up with PCP in 1-2 weeks Please obtain BMP/CBC in one week Follow-up with nephrology  Home Health: None Equipment/Devices none  Discharge Condition: Stable CODE STATUS:Full code Diet recommendation: Cardiac  brief/Interim Summary: 42 year old male with history of congestive heart failure CKD type 2 diabetes and hypertension admitted with orthopnea and lower extremity edema and shortness of breath in spite of taking his outpatient diuretics. His blood pressure on admission was 200/1 40s. Chest x-ray was consistent with pulmonary edema creatinine 4.45 on admission BNP 1100.   Discharge Diagnoses:  Principal Problem:   Acute diastolic CHF (congestive heart failure) (HCC) Active Problems:   Acute on chronic heart failure with preserved ejection fraction (HFpEF) (HCC)   CKD (chronic kidney disease) stage 4, GFR 15-29 ml/min (HCC)   Hypertension associated with diabetes (HCC)   Insulin-requiring or dependent type II diabetes mellitus (HCC)   Hypertensive emergency   Elevated troponin   History of stroke   Malignant hypertension  #1 hypertensive emergency -patient was treated with multiple antihypertensives including hydralazine, Catapres, carvedilol, Imdur, Lasix, hydralazine, Norvasc.  Renal Doppler does not reveal his renal artery stenosis.  Echo shows grade 2 diastolic dysfunction with severe concentric LVH.   #2 acute on chronic diastolic heart failure with grade 2 diastolic dysfunction  Continue above medications.  #3 AKI on CKD stage IV his baseline creatinine is 3.7-3.9 Creatinine 4.3 from 4.45 on admission without much improvement but stable. Nephrology has discussed with the patient that he might need dialysis in the near  future.  Follow-up with Dr. Thedore Mins.   #4 type 2 diabetes continue home medications.   Nutrition Problem: Increased nutrient needs Etiology: acute illness  Signs/Symptoms: estimated needs  Interventions: Education, Refer to RD note for recommendations  Estimated body mass index is 38.55 kg/m as calculated from the following:   Height as of this encounter: 5\' 8"  (1.727 m).   Weight as of this encounter: 115 kg.  Discharge Instructions  Discharge Instructions     Diet - low sodium heart healthy   Complete by: As directed    Increase activity slowly   Complete by: As directed       Allergies as of 09/20/2022   No Known Allergies      Medication List     STOP taking these medications    losartan 25 MG tablet Commonly known as: COZAAR       TAKE these medications    acetaminophen 325 MG tablet Commonly known as: TYLENOL Take 2 tablets (650 mg total) by mouth every 6 (six) hours as needed for mild pain (or Fever >/= 101).   albuterol 108 (90 Base) MCG/ACT inhaler Commonly known as: VENTOLIN HFA Inhale 2 puffs into the lungs every 6 (six) hours as needed for wheezing or shortness of breath.   amLODipine 10 MG tablet Commonly known as: NORVASC Take 1 tablet (10 mg total) by mouth daily.   aspirin EC 81 MG tablet Take 1 tablet (81 mg total) by mouth daily.   carvedilol 25 MG tablet Commonly known as: COREG Take 1 tablet (25 mg total) by mouth 2 (two) times daily.   cloNIDine 0.3 MG tablet Commonly known as: CATAPRES Take 1 tablet (0.3 mg total) by mouth 3 (three) times daily.   fluticasone 50 MCG/ACT nasal spray Commonly  known as: FLONASE Place 2 sprays into both nostrils 2 (two) times daily. What changed:  when to take this reasons to take this   hydrALAZINE 100 MG tablet Commonly known as: APRESOLINE Take 1 tablet (100 mg total) by mouth every 8 (eight) hours. What changed: when to take this   INSULIN SYRINGE .5CC/29G 29G X 1/2" 0.5 ML  Misc Commonly known as: B-D INS SYR ULTRAFINE .5CC/29G 1 each by Does not apply route at bedtime.   isosorbide mononitrate 120 MG 24 hr tablet Commonly known as: IMDUR Take 1 tablet (120 mg total) by mouth daily.   Lancets Misc 1 each by Does not apply route 4 (four) times daily -  before meals and at bedtime.   Lantus SoloStar 100 UNIT/ML Solostar Pen Generic drug: insulin glargine Inject 5 Units into the skin daily at 10 pm.   metolazone 2.5 MG tablet Commonly known as: ZAROXOLYN Take 1 tablet (2.5 mg total) by mouth once a week.   Multivitamin Adults Tabs Take 1 tablet by mouth daily.   ondansetron 4 MG tablet Commonly known as: ZOFRAN Take 1 tablet (4 mg total) by mouth every 6 (six) hours as needed for nausea.   Pen Needles 31G X 6 MM Misc 1 each by Does not apply route at bedtime.   potassium chloride SA 20 MEQ tablet Commonly known as: KLOR-CON M Take 40 mEq by mouth daily.   torsemide 100 MG tablet Commonly known as: DEMADEX Take 1 tablet (100 mg total) by mouth 2 (two) times daily. What changed:  medication strength how much to take when to take this Another medication with the same name was removed. Continue taking this medication, and follow the directions you see here.   True Metrix Air Glucose Meter Devi 1 each by Does not apply route 4 (four) times daily -  before meals and at bedtime.   True Metrix Blood Glucose Test test strip Generic drug: glucose blood Use as instructed What changed:  how much to take how to take this when to take this additional instructions        Follow-up Information     Barbette Merino, NP Follow up.   Specialty: Adult Health Nurse Practitioner Contact information: [address] [place] [address] [phone]         Anthony Sar, MD Follow up.   Specialty: Nephrology Contact information: [address] [place] [address] [phone]                No Known Allergies  Consultations:  Nephrology   Procedures/Studies: VAS US RENAL ARTERY DUPLEX  Result Date: 09/15/2022 ABDOMINAL VISCERAL Patient Name:  Micheal Levy  Date of Exam:   09/15/2022 Medical Rec #: [id number]        Accession #:    [id number] Date of Birth: [birth year]        Patient Gender: M Patient Age:   42 years Exam Location:  [facility] Procedure:      VAS US RENAL ARTERY DUPLEX Referring Phys: [id number] SAIMA RIZWAN -------------------------------------------------------------------------------- Indications: Hypertension High Risk Factors: Hypertension. Limitations: Obesity, air/bowel gas, patient discomfort and patient positioning, patient movement, respiratory disturbance. Comparison Study: 05/21/2021 - Right: Patent renal artery without significant                   stenosis. Abnormal                   right Resistive Index. Normal size right  kidney.                   Left: Patent renal artery without significant stenosis.                   Abnormal                   left Resisitve Index. Normal size of left kidney. Performing Technologist: Chanda Busing RVT  Examination Guidelines: A complete evaluation includes B-mode imaging, spectral Doppler, color Doppler, and power Doppler as needed of all accessible portions of each vessel. Bilateral testing is considered an integral part of a complete examination. Limited examinations for reoccurring indications may be performed as noted.  Duplex Findings: +----------------------+--------+--------+------+--------+ Mesenteric            PSV cm/sEDV cm/sPlaqueComments +----------------------+--------+--------+------+--------+ Aorta Mid                88      19                  +----------------------+--------+--------+------+--------+ Celiac Artery Origin     92                          +----------------------+--------+--------+------+--------+ Celiac Artery Proximal  141      12                   +----------------------+--------+--------+------+--------+    +------------------+--------+--------+-------+ Right Renal ArteryPSV cm/sEDV cm/sComment +------------------+--------+--------+-------+ Origin               27      8            +------------------+--------+--------+-------+ Proximal             30      7            +------------------+--------+--------+-------+ Mid                  21      6            +------------------+--------+--------+-------+ Distal               20      5            +------------------+--------+--------+-------+ +-----------------+--------+--------+-------+ Left Renal ArteryPSV cm/sEDV cm/sComment +-----------------+--------+--------+-------+ Origin              33      7            +-----------------+--------+--------+-------+ Proximal            43      8            +-----------------+--------+--------+-------+ Mid                 49      12           +-----------------+--------+--------+-------+ Distal              19      4            +-----------------+--------+--------+-------+ +------------+--------+--------+----+-----------+--------+--------+----+ Right KidneyPSV cm/sEDV cm/sRI  Left KidneyPSV cm/sEDV cm/sRI   +------------+--------+--------+----+-----------+--------+--------+----+ Upper Pole  12      6       0.51Upper Pole 9       4       0.60 +------------+--------+--------+----+-----------+--------+--------+----+ Mid         11      5  0.49Mid        12      5       0.57 +------------+--------+--------+----+-----------+--------+--------+----+ Lower Pole  13      7       0.48Lower Pole 10      4       0.60 +------------+--------+--------+----+-----------+--------+--------+----+ Hilar       22      6       0.74Hilar      23      5       0.78 +------------+--------+--------+----+-----------+--------+--------+----+ +------------------+-----+------------------+-----+ Right  Kidney           Left Kidney             +------------------+-----+------------------+-----+ RAR                    RAR                     +------------------+-----+------------------+-----+ RAR (manual)      0.31 RAR (manual)      0.56  +------------------+-----+------------------+-----+ Cortex                 Cortex                  +------------------+-----+------------------+-----+ Cortex thickness       Corex thickness         +------------------+-----+------------------+-----+ Kidney length (cm)11.00Kidney length (cm)10.00 +------------------+-----+------------------+-----+  Summary: Renal:  Right: Normal size right kidney. Normal right Resisitive Index. No        evidence of right renal artery stenosis. Left:  No evidence of left renal artery stenosis. Normal left        Resistive Index. Normal size of left kidney.  *See table(s) above for measurements and observations.  Diagnosing physician: Lemar Livings MD  Electronically signed by Lemar Livings MD on 09/15/2022 at 2:30:52 PM.    Final    ECHOCARDIOGRAM COMPLETE  Result Date: 09/14/2022    ECHOCARDIOGRAM REPORT   Patient Name:   ISAIAS BOZARD Date of Exam: 09/14/2022 Medical Rec #:  [id number]       Height:       68.0 in Accession #:    [id number]      Weight:       270.3 lb Date of Birth:  [birth year]       BSA:          2.323 m Patient Age:    42 years        BP:           221/144 mmHg Patient Gender: M               HR:           78 bpm. Exam Location:  Inpatient Procedure: 2D Echo, Color Doppler, Cardiac Doppler and Intracardiac            Opacification Agent Indications:    I50.31 Acute diastolic (congestive) heart failure  History:        Patient has prior history of Echocardiogram examinations, most                 recent 05/23/2022. CHF; Risk Factors:Hypertension, Diabetes and                 Dyslipidemia.  Sonographer:    Irving Burton Senior RDCS Referring Phys: [id number] ANASTASSIA DOUTOVA  Sonographer Comments: Technically  difficult due to poor echo windows from lung interference (possible pneumonia) IMPRESSIONS  1. Left ventricular ejection fraction, by estimation, is 60 to 65%. The left ventricle has normal function. The left ventricle has no regional wall motion abnormalities. There is severe concentric left ventricular hypertrophy. Left ventricular diastolic  parameters are consistent with Grade III diastolic dysfunction (restrictive).  2. Right ventricular systolic function is normal. The right ventricular size is mildly enlarged. Tricuspid regurgitation signal is inadequate for assessing PA pressure.  3. Left atrial size was mildly dilated.  4. The mitral valve is normal in structure. Trivial mitral valve regurgitation.  5. The aortic valve is grossly normal. Aortic valve regurgitation is not visualized. No aortic stenosis is present.  6. The inferior vena cava is dilated in size with <50% respiratory variability, suggesting right atrial pressure of 15 mmHg. Comparison(s): No significant change from prior study. FINDINGS  Left Ventricle: Left ventricular ejection fraction, by estimation, is 60 to 65%. The left ventricle has normal function. The left ventricle has no regional wall motion abnormalities. Definity contrast agent was given IV to delineate the left ventricular  endocardial borders. The left ventricular internal cavity size was normal in size. There is severe concentric left ventricular hypertrophy. Left ventricular diastolic parameters are consistent with Grade III diastolic dysfunction (restrictive). Right Ventricle: The right ventricular size is mildly enlarged. No increase in right ventricular wall thickness. Right ventricular systolic function is normal. Tricuspid regurgitation signal is inadequate for assessing PA pressure. Left Atrium: Left atrial size was mildly dilated. Right Atrium: Right atrial size was not well visualized. Pericardium: Trivial pericardial effusion is present. Presence of epicardial fat  layer. Mitral Valve: The mitral valve is normal in structure. Trivial mitral valve regurgitation. Tricuspid Valve: The tricuspid valve is normal in structure. Tricuspid valve regurgitation is trivial. Aortic Valve: The aortic valve is grossly normal. Aortic valve regurgitation is not visualized. No aortic stenosis is present. Pulmonic Valve: The pulmonic valve was grossly normal. Pulmonic valve regurgitation is not visualized. No evidence of pulmonic stenosis. Aorta: The aortic root and ascending aorta are structurally normal, with no evidence of dilitation. Venous: The inferior vena cava is dilated in size with less than 50% respiratory variability, suggesting right atrial pressure of 15 mmHg. IAS/Shunts: The interatrial septum was not well visualized.  LEFT VENTRICLE PLAX 2D LVIDd:         4.80 cm   Diastology LVIDs:         3.20 cm   LV e' medial:    3.48 cm/s LV PW:         1.80 cm   LV E/e' medial:  27.6 LV IVS:        1.60 cm   LV e' lateral:   4.35 cm/s LVOT diam:     2.10 cm   LV E/e' lateral: 22.1 LV SV:         66 LV SV Index:   28 LVOT Area:     3.46 cm  RIGHT VENTRICLE RV S prime:     12.80 cm/s TAPSE (M-mode): 2.6 cm LEFT ATRIUM             Index        RIGHT ATRIUM           Index LA diam:        4.50 cm 1.94 cm/m   RA Area:     26.60 cm LA Vol (A2C):   98.5 ml 42.41 ml/m  RA Volume:   95.90 ml  41.29 ml/m LA Vol (A4C):   83.6 ml 35.99 ml/m  LA Biplane Vol: 95.4 ml 41.07 ml/m  AORTIC VALVE LVOT Vmax:   117.00 cm/s LVOT Vmean:  85.100 cm/s LVOT VTI:    0.191 m  AORTA Ao Root diam: 3.40 cm Ao Asc diam:  3.60 cm MITRAL VALVE MV Area (PHT): 4.04 cm    SHUNTS MV Decel Time: 188 msec    Systemic VTI:  0.19 m MV E velocity: 96.10 cm/s  Systemic Diam: 2.10 cm MV A velocity: 38.60 cm/s MV E/A ratio:  2.49 Lennie Odor MD Electronically signed by Lennie Odor MD Signature Date/Time: 09/14/2022/11:40:05 AM    Final    DG Chest 2 View  Result Date: 09/13/2022 CLINICAL DATA:  Leg swelling and shortness of  breath. EXAM: CHEST - 2 VIEW COMPARISON:  07/24/2022. FINDINGS: Heart is enlarged and the mediastinal contour is within normal limits. The pulmonary vasculature is distended. Diffuse hazy airspace opacities are noted in the lungs bilaterally. There is a small right pleural effusion. No pneumothorax. No acute osseous abnormality. IMPRESSION: 1. Cardiomegaly with pulmonary vascular congestion. 2. Diffuse hazy airspace opacities bilaterally suggesting edema. Electronically Signed   By: Thornell Sartorius M.D.   On: 09/13/2022 21:20   (Echo, Carotid, EGD, Colonoscopy, ERCP)    Subjective: Sitting up by the side of the bed very anxious to go home  Discharge Exam: Vitals:   09/20/22 0926 09/20/22 1045  BP: (!) 159/103 (!) 187/115  Pulse:  63  Resp:    Temp:    SpO2:     Vitals:   09/20/22 0500 09/20/22 0527 09/20/22 0926 09/20/22 1045  BP:  (!) 153/96 (!) 159/103 (!) 187/115  Pulse:  65  63  Resp:  20    Temp:  98.3 F (36.8 C)    TempSrc:  Oral    SpO2:  96%    Weight: 115 kg     Height:        General: Pt is alert, awake, not in acute distress Cardiovascular: RRR, S1/S2 +, no rubs, no gallops Respiratory: CTA bilaterally, no wheezing, no rhonchi Abdominal: Soft, NT, ND, bowel sounds + Extremities: 2+ edema  The results of significant diagnostics from this hospitalization (including imaging, microbiology, ancillary and laboratory) are listed below for reference.     Microbiology: Recent Results (from the past 240 hour(s))  MRSA Next Gen by PCR, Nasal     Status: None   Collection Time: 09/14/22  8:43 AM   Specimen: Nasal Mucosa; Nasal Swab  Result Value Ref Range Status   MRSA by PCR Next Gen NOT DETECTED NOT DETECTED Final    Comment: (NOTE) The GeneXpert MRSA Assay (FDA approved for NASAL specimens only), is one component of a comprehensive MRSA colonization surveillance program. It is not intended to diagnose MRSA infection nor to guide or monitor treatment for MRSA  infections. Test performance is not FDA approved in patients less than 34 years old. Performed at [facility], [address], [place], [place] [address]      Labs: BNP (last 3 results) Recent Labs    05/22/22 2217 07/20/22 0918 09/13/22 2054  BNP 613.1* 313.3* 1,149.8*   Basic Metabolic Panel: Recent Labs  Lab 09/16/22 0307 09/16/22 0311 09/18/22 0515 09/19/22 0454 09/20/22 0540  NA  --  141 140 139 138  K  --  3.3* 3.2* 3.6 3.2*  CL  --  109 103 102 101  CO2  --  23 24 26 26   GLUCOSE  --  177* 114* 121* 140*  BUN  --  37* 36* 42* 43*  CREATININE  --  4.32* 4.41* 4.24* 4.31*  CALCIUM  --  8.5* 8.5* 8.5* 8.4*  MG 2.3  --   --   --   --    Liver Function Tests: No results for input(s): "AST", "ALT", "ALKPHOS", "BILITOT", "PROT", "ALBUMIN" in the last 168 hours. No results for input(s): "LIPASE", "AMYLASE" in the last 168 hours. No results for input(s): "AMMONIA" in the last 168 hours. CBC: No results for input(s): "WBC", "NEUTROABS", "HGB", "HCT", "MCV", "PLT" in the last 168 hours. Cardiac Enzymes: No results for input(s): "CKTOTAL", "CKMB", "CKMBINDEX", "TROPONINI" in the last 168 hours. BNP: Invalid input(s): "POCBNP" CBG: Recent Labs  Lab 09/19/22 0721 09/19/22 1154 09/19/22 1715 09/19/22 2103 09/20/22 0733  GLUCAP 128* 163* 147* 132* 123*   D-Dimer No results for input(s): "DDIMER" in the last 72 hours. Hgb A1c No results for input(s): "HGBA1C" in the last 72 hours. Lipid Profile No results for input(s): "CHOL", "HDL", "LDLCALC", "TRIG", "CHOLHDL", "LDLDIRECT" in the last 72 hours. Thyroid function studies No results for input(s): "TSH", "T4TOTAL", "T3FREE", "THYROIDAB" in the last 72 hours.  Invalid input(s): "FREET3" Anemia work up No results for input(s): "VITAMINB12", "FOLATE", "FERRITIN", "TIBC", "IRON", "RETICCTPCT" in the last 72 hours. Urinalysis    Component Value Date/Time   COLORURINE COLORLESS (A) 09/13/2022 0158    APPEARANCEUR CLEAR 09/13/2022 0158   LABSPEC 1.005 09/13/2022 0158   PHURINE 7.0 09/13/2022 0158   GLUCOSEU 50 (A) 09/13/2022 0158   HGBUR SMALL (A) 09/13/2022 0158   BILIRUBINUR NEGATIVE 09/13/2022 0158   BILIRUBINUR small 11/11/2019 1206   KETONESUR NEGATIVE 09/13/2022 0158   PROTEINUR 100 (A) 09/13/2022 0158   UROBILINOGEN 0.2 11/11/2019 1206   UROBILINOGEN 0.2 06/04/2017 1058   NITRITE NEGATIVE 09/13/2022 0158   LEUKOCYTESUR TRACE (A) 09/13/2022 0158   Sepsis Labs No results for input(s): "WBC" in the last 168 hours.  Invalid input(s): "PROCALCITONIN", "LACTICIDVEN" Microbiology Recent Results (from the past 240 hour(s))  MRSA Next Gen by PCR, Nasal     Status: None   Collection Time: 09/14/22  8:43 AM   Specimen: Nasal Mucosa; Nasal Swab  Result Value Ref Range Status   MRSA by PCR Next Gen NOT DETECTED NOT DETECTED Final    Comment: (NOTE) The GeneXpert MRSA Assay (FDA approved for NASAL specimens only), is one component of a comprehensive MRSA colonization surveillance program. It is not intended to diagnose MRSA infection nor to guide or monitor treatment for MRSA infections. Test performance is not FDA approved in patients less than 64 years old. Performed at [facility], [address] [place], [place] [address]      Time coordinating discharge: 39 minutes  SIGNED: Alwyn Ren, MD  [facility] 09/22/2022, 4:44 PM

## 2022-09-24 ENCOUNTER — Telehealth: Payer: Self-pay

## 2022-09-24 NOTE — Transitions of Care (Post Inpatient/ED Visit) (Signed)
09/24/2022  Name: Micheal Levy MRN: [id number] DOB: 09/07/1980  Today's TOC FU Call Status: Today's TOC FU Call Status:: Successful TOC FU Call Competed TOC FU Call Complete Date: 09/24/22  Transition Care Management Follow-up Telephone Call Date of Discharge: 09/20/22 Discharge Facility: [facility]) Type of Discharge: Inpatient Admission Primary Inpatient Discharge Diagnosis:: CHF How have you been since you were released from the hospital?: Better Any questions or concerns?: No  Items Reviewed: Did you receive and understand the discharge instructions provided?: Yes Medications obtained,verified, and reconciled?: Yes (Medications Reviewed) Any new allergies since your discharge?: No Dietary orders reviewed?: NA Do you have support at home?: Yes People in Home: spouse  Medications Reviewed Today: Medications Reviewed Today     Reviewed by Valda Lamb, CPhT (Pharmacy Technician) on 09/13/22 at 2250  Med List Status: Complete   Medication Order Taking? Sig Documenting Provider Last Dose Status Informant  albuterol (VENTOLIN HFA) 108 (90 Base) MCG/ACT inhaler [id number] No Inhale 2 puffs into the lungs every 6 (six) hours as needed for wheezing or shortness of breath.  Patient not taking: Reported on 09/13/2022   Merlene Laughter, DO Completed Course Active Self  aspirin EC 81 MG tablet [id number] Yes Take 1 tablet (81 mg total) by mouth daily. Sheikh, Omair Latif, DO Past Week Active Self  Blood Glucose Monitoring Suppl (TRUE METRIX AIR GLUCOSE METER) DEVI [id number]  1 each by Does not apply route 4 (four) times daily -  before meals and at bedtime. [name], FNP  Active Self  carvedilol (COREG) 25 MG tablet [id number] Yes Take 25 mg by mouth 2 (two) times daily. [provider] 09/13/2022 1200 Active Self  cloNIDine (CATAPRES) 0.3 MG tablet [id number] Yes Take 1 tablet (0.3 mg total) by mouth 3 (three) times daily. [name], MD 09/13/2022  Active Self  fluticasone (FLONASE) 50 MCG/ACT nasal spray [id number] Yes Place 2 sprays into both nostrils 2 (two) times daily.  Patient taking differently: Place 2 sprays into both nostrils daily as needed for allergies.   Sheikh, Omair Latif, DO Past Week Active Self  glucose blood (TRUE METRIX BLOOD GLUCOSE TEST) test strip [id number]  Use as instructed  Patient taking differently: 1 each by Other route as directed.   [name], FNP  Active Self  hydrALAZINE (APRESOLINE) 100 MG tablet [id number] Yes Take 1 tablet (100 mg total) by mouth every 8 (eight) hours.  Patient taking differently: Take 100 mg by mouth 3 (three) times daily.   Thomasene Ripple, DO 09/13/2022 Active Self  insulin glargine (LANTUS SOLOSTAR) 100 UNIT/ML Solostar Pen [id number]  Inject 5 Units into the skin daily at 10 pm. [name], MD  Expired 07/20/22 2359 Self, Pharmacy Records  Insulin Pen Needle (PEN NEEDLES) 31G X 6 MM MISC [id number]  1 each by Does not apply route at bedtime. Massie Maroon, FNP  Active Self  INSULIN SYRINGE .5CC/29G (B-D INS SYR ULTRAFINE .5CC/29G) 29G X 1/2" 0.5 ML MISC [id number]  1 each by Does not apply route at bedtime. Massie Maroon, FNP  Active Self  isosorbide mononitrate (IMDUR) 120 MG 24 hr tablet [id number] Yes Take 1 tablet (120 mg total) by mouth daily. [name], MD 09/13/2022 Active Self  Lancets MISC [id number]  1 each by Does not apply route 4 (four) times daily -  before meals and at bedtime. [name], FNP  Active Self  losartan (COZAAR) 25 MG tablet [id number]  Take 2  tablets (50 mg total) by mouth daily. Jerald Kief, MD  Active   Multiple Vitamins-Minerals (MULTIVITAMIN ADULTS) TABS [id number] Yes Take 1 tablet by mouth daily. [provider] Past Week Active Self  potassium chloride SA (KLOR-CON M) 20 MEQ tablet [id number] Yes Take 40 mEq by mouth daily. [provider] 09/13/2022 Active Self  torsemide (DEMADEX) 20 MG tablet [id number]   Take 4 tablets (80 mg total) by mouth daily for 30 doses. Jerald Kief, MD  Active   torsemide [facility]) 20 MG tablet [id number] Yes Take 80 mg by mouth daily. [provider] 09/13/2022 Active Self            Home Care and Equipment/Supplies: Were Home Health Services Ordered?: No Any new equipment or medical supplies ordered?: No  Functional Questionnaire: Do you need assistance with bathing/showering or dressing?: No Do you need assistance with meal preparation?: No Do you need assistance with eating?: No Do you have difficulty maintaining continence: No Do you need assistance with getting out of bed/getting out of a chair/moving?: No Do you have difficulty managing or taking your medications?: No  Follow up appointments reviewed: PCP Follow-up appointment confirmed?: No (DECLINED) Specialist Hospital Follow-up appointment confirmed?: Yes Date of Specialist follow-up appointment?:  (UNSURE OF APPT) Do you need transportation to your follow-up appointment?: No Do you understand care options if your condition(s) worsen?: Yes-patient verbalized understanding    SIGNATURE TB,CMA

## 2023-05-15 NOTE — Discharge Summary (Signed)
 [facility]  DISCHARGE SUMMARY    PATIENT NAME:  Micheal Levy, Micheal Levy  MRN:  [id number]  DOB:  14-Oct-1980    ENCOUNTER DATE:  05/08/2023  INPATIENT ADMISSION DATE: 05/08/2023  DISCHARGE DATE:  05/15/2023    ATTENDING PHYSICIAN: Lucia Gaskins, DO  SERVICE: PRN HOSPITALIST 3  PRIMARY CARE PHYSICIAN: No Pcp       No lay caregiver identified.    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            Current Discharge Medication List        START taking these medications.        Details   amLODIPine 10 mg Tablet  Commonly known as: NORVASC   10 mg, Oral, DAILY  Refills: 0            CONTINUE these medications - NO CHANGES were made during your visit.        Details   aspirin 81 mg Tablet, Delayed Release (E.C.)  Commonly known as: ECOTRIN   81 mg, Oral, DAILY  Refills: 0     cloNIDine HCL 0.3 mg Tablet  Commonly known as: CATAPRES   0.3 mg, Oral, 3 TIMES DAILY  Refills: 0     hydrALAZINE 100 mg Tablet  Commonly known as: APRESOLINE   100 mg, Oral, 3 TIMES DAILY  Refills: 0     Lantus Solostar U-100 Insulin 100 unit/mL (3 mL) Insulin Pen  Generic drug: insulin glargine   5 Units, Subcutaneous,  NIGHTLY  Refills: 0     metOLazone 2.5 mg Tablet  Commonly known as: ZAROXOLYN   2.5 mg, Oral, EVERY 7 DAYS  Refills: 0     torsemide 100 mg Tablet  Commonly known as: DEMADEX   100 mg, Oral, 2 TIMES DAILY  Refills: 0            Discharge med list refreshed?  YES     Allergies   Allergen Reactions    Pork Derived (Porcine)      Religious Belief. Reports being ok receiving heparin for DVT ppx and dialysis     HOSPITAL PROCEDURE(S):   Orders Placed This Encounter   Procedures    BEDSIDE  MISC PROCEDURE       REASON FOR HOSPITALIZATION AND HOSPITAL COURSE   BRIEF HPI:  This is a 43 y.o., male admitted for hypertensive emergency  BRIEF HOSPITAL NARRATIVE:     Patient was admitted to [facility] in regards to his hypertensive emergency.  He was initially treated in the intensive care unit with IV hydralazine and clonidine in  addition to Imdur.  His blood pressure continue to improve and was subsequently transferred to regular floor.  Unfortunately his creatinine continued to increase and he was diagnosed with chronic kidney disease stage 5.  PermCath was then placed and the patient was scheduled for dialysis.  He had 1 course of dialysis on discharge day and was to be followed up in 3 days on an outpatient basis and dialysis and [place].  He was subsequently doing well on without any chest pain he was having no shortness a breath and his blood pressures were significantly improved and he was discharged under stable condition.      TRANSITION/POST DISCHARGE CARE/PENDING TESTS/REFERRALS:     CONDITION ON DISCHARGE:  A. Ambulation: Full ambulation  B. Self-care Ability: Complete  C. Cognitive Status Alert and Oriented x 3  D. Code status at discharge:       LINES/DRAINS/WOUNDS AT DISCHARGE:   Patient Lines/Drains/Airways Status       Active Line / Dialysis Catheter / Dialysis Graft / Drain / Airway / Wound       Name Placement date Placement time Site Days    Peripheral IV Posterior;Right Dorsal Metacarpals  (top  of hand) 05/08/23  1445  -- 7    Dialysis Catheter Cuffed/Tunneled 05/14/23  1000  -- 1                    DISCHARGE DISPOSITION:  Home discharge  DISCHARGE INSTRUCTIONS:       DISCHARGE INSTRUCTION - DIET - RESUME HOME DIET     Diet: RESUME HOME DIET      DISCHARGE INSTRUCTION - ACTIVITY - MAY RESUME PREVIOUS ACTIVITY     Activity: MAY RESUME PREVIOUS ACTIVITY      DISCHARGE INSTRUCTION - FOLLOW SAFETY PLAN    Follow safety plan.     DISCHARGE INSTRUCTION - KEEP FOLLOW-UP APPOINTMENTS AS SCHEDULED    1. Follow with PCP in 1 week or next available appointment  2. Follow up with dialysis in [place] on Monday     DISCHARGE INSTRUCTION - TAKE  MEDICATIONS AS ORDERED          Lucia Gaskins, DO    Copies sent to Care Team         Relationship Specialty Notifications Start End    Pcp, No PCP - General   05/08/23             Referring providers can utilize [email or web address] to access their referred [facility] patient's information.
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
## 2023-11-23 NOTE — Progress Notes (Signed)
 [facility]  [address]  [place] [address]    History and Physical    Name: Micheal Levy MRN:  [id number]   Date: 11/23/2023 DOB:  Jul 15, 1980 (43 y.o.)           Follow Up    Referring Provider: No ref. provider found     Chief Complaint:  Follow Up (AV fistula placed 08/04/23/Lt Brachiocehalic fistula)       HPI:  Patient had creation of left brachiocephalic arteriovenous fistula for hemodialysis in April.  He has no pain numbness or paresthesias in the hand and does not complain any swelling of the arm.  He is currently being dialyzed through a right jugular catheter.    Past Medical History:  Past Medical History:   Diagnosis Date    Congestive heart failure     Convulsions     appx 2022    Diabetes mellitus, type 2     History of kidney disease     HTN (hypertension)     Renal failure     Sleep apnea          Past Surgical History:   Procedure Laterality Date    AV FISTULA PLACEMENT Left 08/04/2023    Dr. Cliffton Hart JOY OTHER      permacath         Family Medical History:       Problem Relation (Age of Onset)    Diabetes Mother             Social History[1]     Allergies:  Allergies[2]     Medications:  Current Outpatient Medications   Medication Sig    albuterol  sulfate (PROVENTIL  OR VENTOLIN  OR PROAIR ) 90 mcg/actuation Inhalation oral inhaler Take 2 Puffs by inhalation    amLODIPine  (NORVASC ) 10 mg Oral Tablet Take 1 Tablet (10 mg total) by mouth Daily    aspirin  (ECOTRIN) 81 mg Oral Tablet, Delayed Release (E.C.) Take 1 Tablet (81 mg total) by mouth Daily    cloNIDine  HCL (CATAPRES ) 0.3 mg Oral Tablet Take 1 Tablet (0.3 mg total) by mouth Three times a day    hydrALAZINE  (APRESOLINE ) 100 mg Oral Tablet Take 1 Tablet (100 mg total) by mouth Three times a day    insulin  glargine (LANTUS SOLOSTAR U-100 INSULIN ) 100 unit/mL Subcutaneous Insulin  Pen Inject 5 Units under the skin Every night    Isosorbide  Mononitrate (IMDUR ) 120 mg Oral Tablet Sustained Release 24 hr Take 1  Tablet (120 mg total) by mouth Daily    methoxy peg-epoetin beta (MIRCERA INJ) 30 mcg    sevelamer  carbonate (RENVELA ) 800 mg Oral Tablet 1 Tablet (800 mg total)    torsemide  40 mg Oral Tablet Take 1 Tablet (40 mg total) by mouth Twice daily        Physical Exam:  Patient is awake and alert.  There was no edema of the arm.  Good thrill is palpable in the fistula along the cephalic vein in the upper arm is palpable all the way up to the axilla and has a strong bruit.  Distal radial pulses also palpable.  Assessment and Plan:    ICD-10-CM    1. 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 Arteriovenous fistula for hemodialysis in place, primary (CMS [facility])  Z99.2         Fistula is ready for cannulation.  He was advised that wants the fistula is being used successfully he can give us  a call so that we can arranged to remove his PermCath.  Follow Up:  No follow-ups on file.    CLIFFTON GALLOWAY, MD                 [1]   Social History  Tobacco Use    Smoking status: Never    Smokeless tobacco: Never   Vaping Use    Vaping status: Never Used   Substance Use Topics    Alcohol  use: Never    Drug use: Never   [2]   Allergies  Allergen Reactions    Pork Derived (Porcine)      Religious Belief. Reports being ok receiving heparin  for DVT ppx and dialysis
